# Patient Record
Sex: Male | Born: 1955 | Race: Black or African American | Hispanic: No | Marital: Single | Smoking: Current every day smoker
Health system: Southern US, Community
[De-identification: ages and names within clinical notes are randomized; demographics above are authoritative.]

## PROBLEM LIST (undated history)

## (undated) DIAGNOSIS — N186 End stage renal disease: Secondary | ICD-10-CM

## (undated) DIAGNOSIS — F17200 Nicotine dependence, unspecified, uncomplicated: Secondary | ICD-10-CM

## (undated) DIAGNOSIS — I2510 Atherosclerotic heart disease of native coronary artery without angina pectoris: Secondary | ICD-10-CM

## (undated) DIAGNOSIS — I5032 Chronic diastolic (congestive) heart failure: Secondary | ICD-10-CM

## (undated) DIAGNOSIS — M549 Dorsalgia, unspecified: Secondary | ICD-10-CM

## (undated) DIAGNOSIS — R002 Palpitations: Secondary | ICD-10-CM

## (undated) DIAGNOSIS — G629 Polyneuropathy, unspecified: Secondary | ICD-10-CM

## (undated) DIAGNOSIS — R51 Headache: Secondary | ICD-10-CM

## (undated) DIAGNOSIS — F419 Anxiety disorder, unspecified: Secondary | ICD-10-CM

## (undated) DIAGNOSIS — Z992 Dependence on renal dialysis: Secondary | ICD-10-CM

## (undated) DIAGNOSIS — J341 Cyst and mucocele of nose and nasal sinus: Secondary | ICD-10-CM

## (undated) DIAGNOSIS — G5600 Carpal tunnel syndrome, unspecified upper limb: Secondary | ICD-10-CM

## (undated) DIAGNOSIS — Z955 Presence of coronary angioplasty implant and graft: Secondary | ICD-10-CM

## (undated) DIAGNOSIS — M1990 Unspecified osteoarthritis, unspecified site: Secondary | ICD-10-CM

## (undated) DIAGNOSIS — G510 Bell's palsy: Secondary | ICD-10-CM

## (undated) DIAGNOSIS — K219 Gastro-esophageal reflux disease without esophagitis: Secondary | ICD-10-CM

## (undated) DIAGNOSIS — K759 Inflammatory liver disease, unspecified: Secondary | ICD-10-CM

## (undated) DIAGNOSIS — Z9889 Other specified postprocedural states: Secondary | ICD-10-CM

## (undated) DIAGNOSIS — I10 Essential (primary) hypertension: Secondary | ICD-10-CM

## (undated) DIAGNOSIS — R112 Nausea with vomiting, unspecified: Secondary | ICD-10-CM

## (undated) HISTORY — PX: TOTAL HIP ARTHROPLASTY: SHX124

## (undated) HISTORY — PX: DG AV DIALYSIS GRAFT DECLOT OR: HXRAD813

## (undated) HISTORY — PX: THYROIDECTOMY: SHX17

## (undated) HISTORY — PX: MANDIBLE FRACTURE SURGERY: SHX706

## (undated) HISTORY — PX: CORONARY ANGIOPLASTY WITH STENT PLACEMENT: SHX49

## (undated) HISTORY — PX: TOOTH EXTRACTION: SUR596

## (undated) HISTORY — DX: Presence of coronary angioplasty implant and graft: Z95.5

## (undated) HISTORY — PX: JOINT REPLACEMENT: SHX530

---

## 1997-07-28 ENCOUNTER — Other Ambulatory Visit: Admission: RE | Admit: 1997-07-28 | Discharge: 1997-07-28 | Payer: Self-pay | Admitting: Nephrology

## 1997-09-04 ENCOUNTER — Other Ambulatory Visit: Admission: RE | Admit: 1997-09-04 | Discharge: 1997-09-04 | Payer: Self-pay | Admitting: Nephrology

## 1997-09-06 ENCOUNTER — Other Ambulatory Visit: Admission: RE | Admit: 1997-09-06 | Discharge: 1997-09-06 | Payer: Self-pay | Admitting: Nephrology

## 1997-09-22 ENCOUNTER — Inpatient Hospital Stay (HOSPITAL_COMMUNITY): Admission: EM | Admit: 1997-09-22 | Discharge: 1997-09-24 | Payer: Self-pay | Admitting: Emergency Medicine

## 1997-10-24 ENCOUNTER — Other Ambulatory Visit: Admission: RE | Admit: 1997-10-24 | Discharge: 1997-10-24 | Payer: Self-pay | Admitting: *Deleted

## 1997-10-24 ENCOUNTER — Ambulatory Visit (HOSPITAL_COMMUNITY): Admission: RE | Admit: 1997-10-24 | Discharge: 1997-10-24 | Payer: Self-pay | Admitting: *Deleted

## 1997-10-30 ENCOUNTER — Other Ambulatory Visit: Admission: RE | Admit: 1997-10-30 | Discharge: 1997-10-30 | Payer: Self-pay | Admitting: Nephrology

## 1997-11-20 ENCOUNTER — Ambulatory Visit (HOSPITAL_COMMUNITY): Admission: RE | Admit: 1997-11-20 | Discharge: 1997-11-20 | Payer: Self-pay | Admitting: Vascular Surgery

## 1997-12-29 ENCOUNTER — Ambulatory Visit (HOSPITAL_COMMUNITY): Admission: RE | Admit: 1997-12-29 | Discharge: 1997-12-29 | Payer: Self-pay | Admitting: Vascular Surgery

## 1998-01-07 ENCOUNTER — Ambulatory Visit (HOSPITAL_COMMUNITY): Admission: RE | Admit: 1998-01-07 | Discharge: 1998-01-07 | Payer: Self-pay | Admitting: Vascular Surgery

## 1998-01-11 ENCOUNTER — Ambulatory Visit (HOSPITAL_COMMUNITY): Admission: RE | Admit: 1998-01-11 | Discharge: 1998-01-11 | Payer: Self-pay | Admitting: Nephrology

## 1998-04-10 ENCOUNTER — Ambulatory Visit (HOSPITAL_COMMUNITY): Admission: RE | Admit: 1998-04-10 | Discharge: 1998-04-10 | Payer: Self-pay | Admitting: Nephrology

## 1998-04-10 ENCOUNTER — Encounter: Payer: Self-pay | Admitting: Nephrology

## 1999-04-04 ENCOUNTER — Inpatient Hospital Stay (HOSPITAL_COMMUNITY): Admission: RE | Admit: 1999-04-04 | Discharge: 1999-04-13 | Payer: Self-pay

## 1999-04-18 ENCOUNTER — Encounter: Payer: Self-pay | Admitting: Nephrology

## 1999-04-18 ENCOUNTER — Encounter: Admission: RE | Admit: 1999-04-18 | Discharge: 1999-04-18 | Payer: Self-pay | Admitting: Nephrology

## 1999-04-23 ENCOUNTER — Encounter: Payer: Self-pay | Admitting: Nephrology

## 1999-04-23 ENCOUNTER — Ambulatory Visit (HOSPITAL_COMMUNITY): Admission: RE | Admit: 1999-04-23 | Discharge: 1999-04-23 | Payer: Self-pay | Admitting: Nephrology

## 1999-05-28 ENCOUNTER — Encounter: Admission: RE | Admit: 1999-05-28 | Discharge: 1999-05-28 | Payer: Self-pay | Admitting: Sports Medicine

## 1999-05-28 ENCOUNTER — Encounter: Payer: Self-pay | Admitting: Sports Medicine

## 1999-09-05 ENCOUNTER — Ambulatory Visit: Admission: RE | Admit: 1999-09-05 | Discharge: 1999-09-05 | Payer: Self-pay | Admitting: *Deleted

## 1999-10-11 ENCOUNTER — Encounter: Payer: Self-pay | Admitting: Vascular Surgery

## 1999-10-11 ENCOUNTER — Ambulatory Visit (HOSPITAL_COMMUNITY): Admission: RE | Admit: 1999-10-11 | Discharge: 1999-10-11 | Payer: Self-pay | Admitting: Vascular Surgery

## 1999-10-30 ENCOUNTER — Encounter: Admission: RE | Admit: 1999-10-30 | Discharge: 1999-10-30 | Payer: Self-pay | Admitting: *Deleted

## 1999-11-19 ENCOUNTER — Ambulatory Visit (HOSPITAL_COMMUNITY): Admission: RE | Admit: 1999-11-19 | Discharge: 1999-11-19 | Payer: Self-pay | Admitting: *Deleted

## 1999-12-23 ENCOUNTER — Ambulatory Visit (HOSPITAL_COMMUNITY): Admission: RE | Admit: 1999-12-23 | Discharge: 1999-12-23 | Payer: Self-pay | Admitting: Nephrology

## 2000-01-02 ENCOUNTER — Ambulatory Visit: Admission: RE | Admit: 2000-01-02 | Discharge: 2000-01-02 | Payer: Self-pay | Admitting: Vascular Surgery

## 2000-01-20 ENCOUNTER — Encounter: Payer: Self-pay | Admitting: Nephrology

## 2000-01-20 ENCOUNTER — Encounter (HOSPITAL_COMMUNITY): Admission: RE | Admit: 2000-01-20 | Discharge: 2000-04-19 | Payer: Self-pay | Admitting: Nephrology

## 2000-05-11 ENCOUNTER — Ambulatory Visit (HOSPITAL_COMMUNITY): Admission: RE | Admit: 2000-05-11 | Discharge: 2000-05-11 | Payer: Self-pay | Admitting: *Deleted

## 2000-08-02 ENCOUNTER — Ambulatory Visit (HOSPITAL_COMMUNITY): Admission: RE | Admit: 2000-08-02 | Discharge: 2000-08-02 | Payer: Self-pay | Admitting: *Deleted

## 2000-08-10 ENCOUNTER — Encounter: Admission: RE | Admit: 2000-08-10 | Discharge: 2000-08-10 | Payer: Self-pay | Admitting: *Deleted

## 2000-09-09 ENCOUNTER — Ambulatory Visit (HOSPITAL_COMMUNITY): Admission: RE | Admit: 2000-09-09 | Discharge: 2000-09-09 | Payer: Self-pay | Admitting: Vascular Surgery

## 2000-09-09 ENCOUNTER — Encounter: Payer: Self-pay | Admitting: Vascular Surgery

## 2000-11-09 ENCOUNTER — Ambulatory Visit (HOSPITAL_COMMUNITY): Admission: RE | Admit: 2000-11-09 | Discharge: 2000-11-09 | Payer: Self-pay | Admitting: Nephrology

## 2000-11-09 ENCOUNTER — Encounter: Payer: Self-pay | Admitting: Nephrology

## 2001-02-01 ENCOUNTER — Encounter: Payer: Self-pay | Admitting: Nephrology

## 2001-02-01 ENCOUNTER — Encounter: Admission: RE | Admit: 2001-02-01 | Discharge: 2001-02-01 | Payer: Self-pay | Admitting: Nephrology

## 2001-11-24 ENCOUNTER — Ambulatory Visit (HOSPITAL_COMMUNITY): Admission: RE | Admit: 2001-11-24 | Discharge: 2001-11-24 | Payer: Self-pay | Admitting: Nephrology

## 2001-11-24 ENCOUNTER — Encounter: Payer: Self-pay | Admitting: Nephrology

## 2001-12-01 ENCOUNTER — Encounter: Payer: Self-pay | Admitting: Nephrology

## 2001-12-01 ENCOUNTER — Encounter: Admission: RE | Admit: 2001-12-01 | Discharge: 2001-12-01 | Payer: Self-pay | Admitting: Nephrology

## 2002-01-03 ENCOUNTER — Ambulatory Visit (HOSPITAL_COMMUNITY): Admission: RE | Admit: 2002-01-03 | Discharge: 2002-01-03 | Payer: Self-pay | Admitting: Vascular Surgery

## 2002-04-11 ENCOUNTER — Encounter: Payer: Self-pay | Admitting: Nephrology

## 2002-04-11 ENCOUNTER — Ambulatory Visit (HOSPITAL_COMMUNITY): Admission: RE | Admit: 2002-04-11 | Discharge: 2002-04-11 | Payer: Self-pay | Admitting: Nephrology

## 2002-05-30 ENCOUNTER — Ambulatory Visit (HOSPITAL_COMMUNITY): Admission: RE | Admit: 2002-05-30 | Discharge: 2002-05-30 | Payer: Self-pay | Admitting: *Deleted

## 2002-07-13 ENCOUNTER — Ambulatory Visit (HOSPITAL_COMMUNITY): Admission: RE | Admit: 2002-07-13 | Discharge: 2002-07-13 | Payer: Self-pay | Admitting: Vascular Surgery

## 2002-08-30 ENCOUNTER — Ambulatory Visit (HOSPITAL_COMMUNITY): Admission: RE | Admit: 2002-08-30 | Discharge: 2002-08-30 | Payer: Self-pay | Admitting: *Deleted

## 2002-09-25 ENCOUNTER — Encounter: Payer: Self-pay | Admitting: *Deleted

## 2002-09-25 ENCOUNTER — Ambulatory Visit (HOSPITAL_COMMUNITY): Admission: RE | Admit: 2002-09-25 | Discharge: 2002-09-25 | Payer: Self-pay | Admitting: Vascular Surgery

## 2002-10-26 ENCOUNTER — Ambulatory Visit (HOSPITAL_COMMUNITY): Admission: RE | Admit: 2002-10-26 | Discharge: 2002-10-26 | Payer: Self-pay | Admitting: Vascular Surgery

## 2002-10-26 ENCOUNTER — Encounter: Payer: Self-pay | Admitting: Vascular Surgery

## 2002-11-21 ENCOUNTER — Inpatient Hospital Stay (HOSPITAL_COMMUNITY): Admission: EM | Admit: 2002-11-21 | Discharge: 2002-11-28 | Payer: Self-pay | Admitting: Emergency Medicine

## 2002-11-22 ENCOUNTER — Encounter: Payer: Self-pay | Admitting: Nephrology

## 2002-11-28 ENCOUNTER — Encounter: Payer: Self-pay | Admitting: Nephrology

## 2002-12-14 ENCOUNTER — Encounter: Payer: Self-pay | Admitting: Nephrology

## 2002-12-14 ENCOUNTER — Ambulatory Visit (HOSPITAL_COMMUNITY): Admission: RE | Admit: 2002-12-14 | Discharge: 2002-12-14 | Payer: Self-pay | Admitting: Nephrology

## 2003-02-06 ENCOUNTER — Encounter: Payer: Self-pay | Admitting: *Deleted

## 2003-02-06 ENCOUNTER — Ambulatory Visit (HOSPITAL_COMMUNITY): Admission: RE | Admit: 2003-02-06 | Discharge: 2003-02-06 | Payer: Self-pay | Admitting: *Deleted

## 2003-03-26 ENCOUNTER — Ambulatory Visit (HOSPITAL_COMMUNITY): Admission: RE | Admit: 2003-03-26 | Discharge: 2003-03-26 | Payer: Self-pay | Admitting: Vascular Surgery

## 2003-03-29 ENCOUNTER — Ambulatory Visit (HOSPITAL_COMMUNITY): Admission: RE | Admit: 2003-03-29 | Discharge: 2003-03-29 | Payer: Self-pay | Admitting: Vascular Surgery

## 2003-05-24 ENCOUNTER — Ambulatory Visit (HOSPITAL_COMMUNITY): Admission: RE | Admit: 2003-05-24 | Discharge: 2003-05-24 | Payer: Self-pay | Admitting: Vascular Surgery

## 2003-06-05 ENCOUNTER — Ambulatory Visit (HOSPITAL_COMMUNITY): Admission: RE | Admit: 2003-06-05 | Discharge: 2003-06-05 | Payer: Self-pay | Admitting: Nephrology

## 2003-09-10 ENCOUNTER — Ambulatory Visit (HOSPITAL_COMMUNITY): Admission: RE | Admit: 2003-09-10 | Discharge: 2003-09-10 | Payer: Self-pay | Admitting: Vascular Surgery

## 2003-09-11 ENCOUNTER — Ambulatory Visit (HOSPITAL_COMMUNITY): Admission: RE | Admit: 2003-09-11 | Discharge: 2003-09-11 | Payer: Self-pay | Admitting: *Deleted

## 2003-09-18 ENCOUNTER — Ambulatory Visit (HOSPITAL_COMMUNITY): Admission: RE | Admit: 2003-09-18 | Discharge: 2003-09-18 | Payer: Self-pay | Admitting: Nephrology

## 2003-10-02 ENCOUNTER — Ambulatory Visit (HOSPITAL_COMMUNITY): Admission: RE | Admit: 2003-10-02 | Discharge: 2003-10-02 | Payer: Self-pay | Admitting: Nephrology

## 2003-10-11 ENCOUNTER — Encounter (HOSPITAL_COMMUNITY): Admission: RE | Admit: 2003-10-11 | Discharge: 2003-10-22 | Payer: Self-pay | Admitting: Nephrology

## 2003-11-13 ENCOUNTER — Ambulatory Visit (HOSPITAL_COMMUNITY): Admission: RE | Admit: 2003-11-13 | Discharge: 2003-11-13 | Payer: Self-pay | Admitting: Nephrology

## 2003-11-27 ENCOUNTER — Ambulatory Visit (HOSPITAL_COMMUNITY): Admission: RE | Admit: 2003-11-27 | Discharge: 2003-11-27 | Payer: Self-pay | Admitting: Vascular Surgery

## 2003-12-04 ENCOUNTER — Encounter (HOSPITAL_COMMUNITY): Admission: RE | Admit: 2003-12-04 | Discharge: 2003-12-10 | Payer: Self-pay | Admitting: Nephrology

## 2004-01-24 ENCOUNTER — Ambulatory Visit (HOSPITAL_COMMUNITY): Admission: RE | Admit: 2004-01-24 | Discharge: 2004-01-24 | Payer: Self-pay | Admitting: Vascular Surgery

## 2004-01-31 ENCOUNTER — Inpatient Hospital Stay (HOSPITAL_COMMUNITY): Admission: AD | Admit: 2004-01-31 | Discharge: 2004-02-01 | Payer: Self-pay | Admitting: *Deleted

## 2004-06-10 ENCOUNTER — Ambulatory Visit (HOSPITAL_COMMUNITY): Admission: RE | Admit: 2004-06-10 | Discharge: 2004-06-11 | Payer: Self-pay | Admitting: Vascular Surgery

## 2004-06-11 ENCOUNTER — Ambulatory Visit (HOSPITAL_COMMUNITY): Admission: RE | Admit: 2004-06-11 | Discharge: 2004-06-11 | Payer: Self-pay | Admitting: Vascular Surgery

## 2004-08-22 ENCOUNTER — Inpatient Hospital Stay (HOSPITAL_COMMUNITY): Admission: EM | Admit: 2004-08-22 | Discharge: 2004-08-30 | Payer: Self-pay | Admitting: Emergency Medicine

## 2004-08-31 ENCOUNTER — Ambulatory Visit (HOSPITAL_COMMUNITY): Admission: RE | Admit: 2004-08-31 | Discharge: 2004-08-31 | Payer: Self-pay | Admitting: Nephrology

## 2004-09-04 ENCOUNTER — Ambulatory Visit (HOSPITAL_COMMUNITY): Admission: RE | Admit: 2004-09-04 | Discharge: 2004-09-04 | Payer: Self-pay | Admitting: Nephrology

## 2004-10-31 ENCOUNTER — Emergency Department (HOSPITAL_COMMUNITY): Admission: EM | Admit: 2004-10-31 | Discharge: 2004-10-31 | Payer: Self-pay | Admitting: Emergency Medicine

## 2004-11-14 ENCOUNTER — Ambulatory Visit (HOSPITAL_COMMUNITY): Admission: RE | Admit: 2004-11-14 | Discharge: 2004-11-14 | Payer: Self-pay | Admitting: Vascular Surgery

## 2004-11-17 ENCOUNTER — Ambulatory Visit (HOSPITAL_COMMUNITY): Admission: RE | Admit: 2004-11-17 | Discharge: 2004-11-17 | Payer: Self-pay | Admitting: Vascular Surgery

## 2004-12-16 ENCOUNTER — Ambulatory Visit (HOSPITAL_COMMUNITY): Admission: RE | Admit: 2004-12-16 | Discharge: 2004-12-16 | Payer: Self-pay | Admitting: Nephrology

## 2005-02-02 ENCOUNTER — Emergency Department (HOSPITAL_COMMUNITY): Admission: EM | Admit: 2005-02-02 | Discharge: 2005-02-03 | Payer: Self-pay | Admitting: Emergency Medicine

## 2005-02-15 ENCOUNTER — Encounter: Admission: RE | Admit: 2005-02-15 | Discharge: 2005-02-15 | Payer: Self-pay | Admitting: Nephrology

## 2005-02-18 ENCOUNTER — Encounter: Admission: RE | Admit: 2005-02-18 | Discharge: 2005-02-18 | Payer: Self-pay | Admitting: Nephrology

## 2005-03-03 ENCOUNTER — Ambulatory Visit (HOSPITAL_COMMUNITY): Admission: RE | Admit: 2005-03-03 | Discharge: 2005-03-03 | Payer: Self-pay | Admitting: Vascular Surgery

## 2005-04-07 ENCOUNTER — Inpatient Hospital Stay (HOSPITAL_COMMUNITY): Admission: RE | Admit: 2005-04-07 | Discharge: 2005-04-08 | Payer: Self-pay | Admitting: Vascular Surgery

## 2005-06-03 ENCOUNTER — Ambulatory Visit (HOSPITAL_COMMUNITY): Admission: RE | Admit: 2005-06-03 | Discharge: 2005-06-03 | Payer: Self-pay | Admitting: Nephrology

## 2005-06-08 ENCOUNTER — Inpatient Hospital Stay (HOSPITAL_COMMUNITY): Admission: AD | Admit: 2005-06-08 | Discharge: 2005-06-17 | Payer: Self-pay | Admitting: Nephrology

## 2005-06-11 ENCOUNTER — Encounter: Payer: Self-pay | Admitting: Nephrology

## 2005-06-17 ENCOUNTER — Ambulatory Visit: Payer: Self-pay | Admitting: Infectious Diseases

## 2005-07-14 ENCOUNTER — Ambulatory Visit (HOSPITAL_COMMUNITY): Admission: RE | Admit: 2005-07-14 | Discharge: 2005-07-14 | Payer: Self-pay | Admitting: Vascular Surgery

## 2005-10-30 ENCOUNTER — Ambulatory Visit (HOSPITAL_COMMUNITY): Admission: RE | Admit: 2005-10-30 | Discharge: 2005-10-30 | Payer: Self-pay | Admitting: Nephrology

## 2006-01-21 ENCOUNTER — Ambulatory Visit (HOSPITAL_COMMUNITY): Admission: RE | Admit: 2006-01-21 | Discharge: 2006-01-21 | Payer: Self-pay | Admitting: Neurosurgery

## 2006-01-29 ENCOUNTER — Ambulatory Visit (HOSPITAL_COMMUNITY): Admission: RE | Admit: 2006-01-29 | Discharge: 2006-01-29 | Payer: Self-pay | Admitting: Nephrology

## 2006-04-02 ENCOUNTER — Emergency Department (HOSPITAL_COMMUNITY): Admission: EM | Admit: 2006-04-02 | Discharge: 2006-04-02 | Payer: Self-pay | Admitting: Emergency Medicine

## 2008-03-15 ENCOUNTER — Ambulatory Visit (HOSPITAL_COMMUNITY): Admission: RE | Admit: 2008-03-15 | Discharge: 2008-03-15 | Payer: Self-pay | Admitting: Nephrology

## 2008-04-27 DIAGNOSIS — K759 Inflammatory liver disease, unspecified: Secondary | ICD-10-CM

## 2008-04-27 HISTORY — DX: Inflammatory liver disease, unspecified: K75.9

## 2009-07-23 ENCOUNTER — Encounter: Admission: RE | Admit: 2009-07-23 | Discharge: 2009-07-23 | Payer: Self-pay | Admitting: Nephrology

## 2010-05-17 ENCOUNTER — Encounter: Payer: Self-pay | Admitting: Nephrology

## 2010-05-18 ENCOUNTER — Encounter: Payer: Self-pay | Admitting: Nephrology

## 2010-06-03 ENCOUNTER — Encounter (HOSPITAL_COMMUNITY): Payer: Self-pay | Admitting: Radiology

## 2010-06-03 ENCOUNTER — Emergency Department (HOSPITAL_COMMUNITY): Payer: Medicare Other

## 2010-06-03 ENCOUNTER — Inpatient Hospital Stay (HOSPITAL_COMMUNITY)
Admission: EM | Admit: 2010-06-03 | Discharge: 2010-06-11 | DRG: 423 | Disposition: A | Discharge status: Home / Self Care | Payer: Medicare Other | Source: Ambulatory Visit | Attending: Family Medicine | Admitting: Family Medicine

## 2010-06-03 DIAGNOSIS — Z992 Dependence on renal dialysis: Secondary | ICD-10-CM

## 2010-06-03 DIAGNOSIS — A498 Other bacterial infections of unspecified site: Secondary | ICD-10-CM | POA: Diagnosis present

## 2010-06-03 DIAGNOSIS — N186 End stage renal disease: Secondary | ICD-10-CM | POA: Diagnosis present

## 2010-06-03 DIAGNOSIS — K859 Acute pancreatitis without necrosis or infection, unspecified: Principal | ICD-10-CM | POA: Diagnosis present

## 2010-06-03 DIAGNOSIS — J449 Chronic obstructive pulmonary disease, unspecified: Secondary | ICD-10-CM | POA: Diagnosis present

## 2010-06-03 DIAGNOSIS — R7881 Bacteremia: Secondary | ICD-10-CM | POA: Diagnosis present

## 2010-06-03 DIAGNOSIS — I9589 Other hypotension: Secondary | ICD-10-CM | POA: Diagnosis present

## 2010-06-03 DIAGNOSIS — G8929 Other chronic pain: Secondary | ICD-10-CM | POA: Diagnosis present

## 2010-06-03 DIAGNOSIS — M545 Low back pain, unspecified: Secondary | ICD-10-CM | POA: Diagnosis present

## 2010-06-03 DIAGNOSIS — E875 Hyperkalemia: Secondary | ICD-10-CM | POA: Diagnosis present

## 2010-06-03 DIAGNOSIS — M8668 Other chronic osteomyelitis, other site: Secondary | ICD-10-CM | POA: Diagnosis present

## 2010-06-03 DIAGNOSIS — F17200 Nicotine dependence, unspecified, uncomplicated: Secondary | ICD-10-CM | POA: Diagnosis present

## 2010-06-03 DIAGNOSIS — T82898A Other specified complication of vascular prosthetic devices, implants and grafts, initial encounter: Secondary | ICD-10-CM | POA: Diagnosis present

## 2010-06-03 DIAGNOSIS — J4489 Other specified chronic obstructive pulmonary disease: Secondary | ICD-10-CM | POA: Diagnosis present

## 2010-06-03 DIAGNOSIS — N2581 Secondary hyperparathyroidism of renal origin: Secondary | ICD-10-CM | POA: Diagnosis present

## 2010-06-03 DIAGNOSIS — E119 Type 2 diabetes mellitus without complications: Secondary | ICD-10-CM | POA: Diagnosis present

## 2010-06-03 DIAGNOSIS — Y849 Medical procedure, unspecified as the cause of abnormal reaction of the patient, or of later complication, without mention of misadventure at the time of the procedure: Secondary | ICD-10-CM | POA: Diagnosis present

## 2010-06-03 LAB — DIFFERENTIAL
Basophils Relative: 0 % (ref 0–1)
Eosinophils Absolute: 0 10*3/uL (ref 0.0–0.7)
Lymphocytes Relative: 2 % — ABNORMAL LOW (ref 12–46)
Monocytes Relative: 4 % (ref 3–12)
Neutro Abs: 29.4 10*3/uL — ABNORMAL HIGH (ref 1.7–7.7)
Neutrophils Relative %: 94 % — ABNORMAL HIGH (ref 43–77)

## 2010-06-03 LAB — BASIC METABOLIC PANEL
CO2: 22 mEq/L (ref 19–32)
Creatinine, Ser: 12.6 mg/dL — ABNORMAL HIGH (ref 0.4–1.5)
Glucose, Bld: 128 mg/dL — ABNORMAL HIGH (ref 70–99)
Potassium: 5.5 mEq/L — ABNORMAL HIGH (ref 3.5–5.1)
Sodium: 132 mEq/L — ABNORMAL LOW (ref 135–145)

## 2010-06-03 LAB — CBC
HCT: 47.4 % (ref 39.0–52.0)
MCH: 30.8 pg (ref 26.0–34.0)
MCV: 93.1 fL (ref 78.0–100.0)
RBC: 5.09 MIL/uL (ref 4.22–5.81)
RDW: 15.4 % (ref 11.5–15.5)
WBC: 31.4 10*3/uL — ABNORMAL HIGH (ref 4.0–10.5)

## 2010-06-03 LAB — HEPATIC FUNCTION PANEL
Bilirubin, Direct: 1.7 mg/dL — ABNORMAL HIGH (ref 0.0–0.3)
Total Bilirubin: 2.9 mg/dL — ABNORMAL HIGH (ref 0.3–1.2)

## 2010-06-03 LAB — GLUCOSE, CAPILLARY
Glucose-Capillary: 101 mg/dL — ABNORMAL HIGH (ref 70–99)
Glucose-Capillary: 92 mg/dL (ref 70–99)

## 2010-06-03 LAB — APTT: aPTT: 33 seconds (ref 24–37)

## 2010-06-03 LAB — LIPASE, BLOOD: Lipase: 118 U/L — ABNORMAL HIGH (ref 11–59)

## 2010-06-03 LAB — MRSA PCR SCREENING: MRSA by PCR: NEGATIVE

## 2010-06-03 LAB — PROTIME-INR: INR: 1.06 (ref 0.00–1.49)

## 2010-06-03 LAB — LACTATE DEHYDROGENASE: LDH: 208 U/L (ref 94–250)

## 2010-06-03 MED ORDER — IOHEXOL 300 MG/ML  SOLN: 100.0000 mL | Freq: Once | INTRAMUSCULAR | Status: AC | PRN

## 2010-06-04 ENCOUNTER — Inpatient Hospital Stay (HOSPITAL_COMMUNITY): Payer: Medicare Other

## 2010-06-04 LAB — CBC
Hemoglobin: 14.2 g/dL (ref 13.0–17.0)
MCV: 93.1 fL (ref 78.0–100.0)
Platelets: 160 10*3/uL (ref 150–400)
RDW: 15.9 % — ABNORMAL HIGH (ref 11.5–15.5)
WBC: 17.9 10*3/uL — ABNORMAL HIGH (ref 4.0–10.5)

## 2010-06-04 LAB — COMPREHENSIVE METABOLIC PANEL
ALT: 46 U/L (ref 0–53)
CO2: 23 mEq/L (ref 19–32)
Calcium: 8.4 mg/dL (ref 8.4–10.5)
GFR calc non Af Amer: 4 mL/min — ABNORMAL LOW (ref >60)
Glucose, Bld: 96 mg/dL (ref 70–99)
Sodium: 131 mEq/L — ABNORMAL LOW (ref 135–145)

## 2010-06-04 LAB — GLUCOSE, CAPILLARY: Glucose-Capillary: 87 mg/dL (ref 70–99)

## 2010-06-04 LAB — LIPASE, BLOOD: Lipase: 33 U/L (ref 11–59)

## 2010-06-04 LAB — MAGNESIUM: Magnesium: 1.9 mg/dL (ref 1.5–2.5)

## 2010-06-05 ENCOUNTER — Inpatient Hospital Stay (HOSPITAL_COMMUNITY): Payer: Medicare Other

## 2010-06-05 DIAGNOSIS — B9689 Other specified bacterial agents as the cause of diseases classified elsewhere: Secondary | ICD-10-CM

## 2010-06-05 DIAGNOSIS — I10 Essential (primary) hypertension: Secondary | ICD-10-CM

## 2010-06-05 DIAGNOSIS — M8660 Other chronic osteomyelitis, unspecified site: Secondary | ICD-10-CM

## 2010-06-05 DIAGNOSIS — J449 Chronic obstructive pulmonary disease, unspecified: Secondary | ICD-10-CM

## 2010-06-05 DIAGNOSIS — N186 End stage renal disease: Secondary | ICD-10-CM

## 2010-06-05 DIAGNOSIS — R7881 Bacteremia: Secondary | ICD-10-CM

## 2010-06-05 DIAGNOSIS — M109 Gout, unspecified: Secondary | ICD-10-CM

## 2010-06-05 LAB — BASIC METABOLIC PANEL
BUN: 48 mg/dL — ABNORMAL HIGH (ref 6–23)
CO2: 24 mEq/L (ref 19–32)
Calcium: 9 mg/dL (ref 8.4–10.5)
GFR calc Af Amer: 6 mL/min — ABNORMAL LOW (ref >60)
GFR calc non Af Amer: 5 mL/min — ABNORMAL LOW (ref >60)
GFR calc non Af Amer: 5 mL/min — ABNORMAL LOW (ref >60)
Glucose, Bld: 115 mg/dL — ABNORMAL HIGH (ref 70–99)
Potassium: 4.3 mEq/L (ref 3.5–5.1)
Sodium: 133 mEq/L — ABNORMAL LOW (ref 135–145)

## 2010-06-05 LAB — CBC
HCT: 40.1 % (ref 39.0–52.0)
MCH: 30.8 pg (ref 26.0–34.0)
MCHC: 32.8 g/dL (ref 30.0–36.0)
MCV: 93.5 fL (ref 78.0–100.0)
Platelets: 125 10*3/uL — ABNORMAL LOW (ref 150–400)
RBC: 4.29 MIL/uL (ref 4.22–5.81)
RDW: 15.8 % — ABNORMAL HIGH (ref 11.5–15.5)

## 2010-06-05 LAB — DIFFERENTIAL
Eosinophils Absolute: 0.3 10*3/uL (ref 0.0–0.7)
Eosinophils Relative: 4 % (ref 0–5)
Lymphocytes Relative: 7 % — ABNORMAL LOW (ref 12–46)
Lymphs Abs: 0.6 10*3/uL — ABNORMAL LOW (ref 0.7–4.0)
Monocytes Relative: 17 % — ABNORMAL HIGH (ref 3–12)
Neutrophils Relative %: 72 % (ref 43–77)

## 2010-06-05 LAB — GLUCOSE, CAPILLARY: Glucose-Capillary: 106 mg/dL — ABNORMAL HIGH (ref 70–99)

## 2010-06-06 ENCOUNTER — Inpatient Hospital Stay (HOSPITAL_COMMUNITY): Payer: Medicare Other

## 2010-06-06 DIAGNOSIS — T82898A Other specified complication of vascular prosthetic devices, implants and grafts, initial encounter: Secondary | ICD-10-CM

## 2010-06-06 LAB — GLUCOSE, CAPILLARY
Glucose-Capillary: 101 mg/dL — ABNORMAL HIGH (ref 70–99)
Glucose-Capillary: 119 mg/dL — ABNORMAL HIGH (ref 70–99)

## 2010-06-06 LAB — CBC
MCV: 92.2 fL (ref 78.0–100.0)
Platelets: 148 10*3/uL — ABNORMAL LOW (ref 150–400)
RBC: 4.21 MIL/uL — ABNORMAL LOW (ref 4.22–5.81)
RDW: 15.5 % (ref 11.5–15.5)
WBC: 8.2 10*3/uL (ref 4.0–10.5)

## 2010-06-06 LAB — COMPREHENSIVE METABOLIC PANEL
ALT: 26 U/L (ref 0–53)
AST: 52 U/L — ABNORMAL HIGH (ref 0–37)
Albumin: 3 g/dL — ABNORMAL LOW (ref 3.5–5.2)
Alkaline Phosphatase: 99 U/L (ref 39–117)
BUN: 60 mg/dL — ABNORMAL HIGH (ref 6–23)
Chloride: 90 mEq/L — ABNORMAL LOW (ref 96–112)
Potassium: 4.4 mEq/L (ref 3.5–5.1)
Sodium: 130 mEq/L — ABNORMAL LOW (ref 135–145)
Total Bilirubin: 0.5 mg/dL (ref 0.3–1.2)
Total Protein: 7.8 g/dL (ref 6.0–8.3)

## 2010-06-07 LAB — GLUCOSE, CAPILLARY
Glucose-Capillary: 133 mg/dL — ABNORMAL HIGH (ref 70–99)
Glucose-Capillary: 106 mg/dL — ABNORMAL HIGH (ref 70–99)
Glucose-Capillary: 111 mg/dL — ABNORMAL HIGH (ref 70–99)
Glucose-Capillary: 134 mg/dL — ABNORMAL HIGH (ref 70–99)

## 2010-06-07 LAB — CULTURE, BLOOD (ROUTINE X 2): Culture  Setup Time: 201202080039

## 2010-06-08 LAB — CBC
Hemoglobin: 13.4 g/dL (ref 13.0–17.0)
MCH: 30.6 pg (ref 26.0–34.0)
Platelets: 137 10*3/uL — ABNORMAL LOW (ref 150–400)
RBC: 4.38 MIL/uL (ref 4.22–5.81)
WBC: 9.8 10*3/uL (ref 4.0–10.5)

## 2010-06-08 LAB — BASIC METABOLIC PANEL
Calcium: 8.3 mg/dL — ABNORMAL LOW (ref 8.4–10.5)
Chloride: 96 mEq/L (ref 96–112)
Creatinine, Ser: 10.73 mg/dL — ABNORMAL HIGH (ref 0.4–1.5)
GFR calc Af Amer: 6 mL/min — ABNORMAL LOW (ref >60)
Sodium: 133 mEq/L — ABNORMAL LOW (ref 135–145)

## 2010-06-08 LAB — GLUCOSE, CAPILLARY
Glucose-Capillary: 108 mg/dL — ABNORMAL HIGH (ref 70–99)
Glucose-Capillary: 123 mg/dL — ABNORMAL HIGH (ref 70–99)
Glucose-Capillary: 93 mg/dL (ref 70–99)

## 2010-06-09 ENCOUNTER — Inpatient Hospital Stay (HOSPITAL_COMMUNITY): Payer: Medicare Other

## 2010-06-09 LAB — CBC
MCH: 30.6 pg (ref 26.0–34.0)
MCV: 90.3 fL (ref 78.0–100.0)
Platelets: 182 10*3/uL (ref 150–400)
RDW: 15.1 % (ref 11.5–15.5)
WBC: 11.1 10*3/uL — ABNORMAL HIGH (ref 4.0–10.5)

## 2010-06-09 LAB — BASIC METABOLIC PANEL
CO2: 21 mEq/L (ref 19–32)
Calcium: 8.2 mg/dL — ABNORMAL LOW (ref 8.4–10.5)
Creatinine, Ser: 13.2 mg/dL — ABNORMAL HIGH (ref 0.4–1.5)
Glucose, Bld: 110 mg/dL — ABNORMAL HIGH (ref 70–99)

## 2010-06-09 LAB — PROTIME-INR: Prothrombin Time: 12.5 seconds (ref 11.6–15.2)

## 2010-06-09 LAB — GLUCOSE, CAPILLARY

## 2010-06-09 MED ORDER — IOHEXOL 300 MG/ML  SOLN: 100.0000 mL | Freq: Once | INTRAMUSCULAR | Status: AC | PRN

## 2010-06-10 DIAGNOSIS — B9689 Other specified bacterial agents as the cause of diseases classified elsewhere: Secondary | ICD-10-CM

## 2010-06-10 DIAGNOSIS — R7881 Bacteremia: Secondary | ICD-10-CM

## 2010-06-10 DIAGNOSIS — M8660 Other chronic osteomyelitis, unspecified site: Secondary | ICD-10-CM

## 2010-06-11 ENCOUNTER — Inpatient Hospital Stay (HOSPITAL_COMMUNITY): Payer: Medicare Other

## 2010-06-11 LAB — RENAL FUNCTION PANEL
CO2: 21 mEq/L (ref 19–32)
Calcium: 7.5 mg/dL — ABNORMAL LOW (ref 8.4–10.5)
Creatinine, Ser: 12 mg/dL — ABNORMAL HIGH (ref 0.4–1.5)
GFR calc Af Amer: 5 mL/min — ABNORMAL LOW (ref >60)
GFR calc non Af Amer: 4 mL/min — ABNORMAL LOW (ref >60)
Phosphorus: 5.3 mg/dL — ABNORMAL HIGH (ref 2.3–4.6)
Sodium: 134 mEq/L — ABNORMAL LOW (ref 135–145)

## 2010-06-11 LAB — CBC
MCH: 29.2 pg (ref 26.0–34.0)
MCHC: 32.3 g/dL (ref 30.0–36.0)
Platelets: 181 10*3/uL (ref 150–400)
RDW: 15.5 % (ref 11.5–15.5)

## 2010-06-11 MED ORDER — IOHEXOL 300 MG/ML  SOLN
100.0000 mL | Freq: Once | INTRAMUSCULAR | Status: AC | PRN
  Administered 2010-06-11: 75 mL via INTRAVENOUS

## 2010-06-12 LAB — CULTURE, BLOOD (ROUTINE X 2)
Culture  Setup Time: 201202100042
Culture  Setup Time: 201202100217
Culture: NO GROWTH

## 2010-06-12 LAB — OVA AND PARASITE EXAMINATION

## 2010-06-13 LAB — STRONGYLOIDES ANTIBODY

## 2010-06-17 NOTE — Discharge Summary (Signed)
[name], [name]                 ACCOUNT NO.:  [id number]  MEDICAL RECORD NO.:  [id number]           PATIENT TYPE:  I  LOCATION:  6736                         FACILITY:  [facility]  PHYSICIAN:  Leighton Roach Nicholos Aloisi, M.D.DATE OF BIRTH:  1955-09-01  DATE OF ADMISSION:  06/03/2010 DATE OF DISCHARGE:  06/11/2010                              DISCHARGE SUMMARY   DISCHARGE DIAGNOSES: 1. Klebsiella and Escherichia coli bacteremia, unknown source. 2. Thrombosed right femoral dialysis graft, status post thrombolysis. 3. End-stage renal disease. 4. Chronic osteomyelitis of lumbar spine. 5. Chronic pain.  DISCHARGE MEDICATIONS: 1. Ciprofloxacin 500 mg p.o. nightly take for six more days. 2. Advil 200 mg one-three tablets p.o. q. 8 p.r.n. for pain. 3. Aspirin 325 mg p.o. daily. 4. Atarax 25 mg p.o. t.i.d. p.r.n. for itching. 5. Colchicine 0.6 mg p.o. b.i.d. p.r.n. for gout flare. 6. Nephro-Vite one tablet p.o. daily. 7. Gabapentin 100 mg p.o. nightly. 8. Nexium 40 mg p.o. nightly p.r.n. for acid reflux. 9. Oxycodone 5 mg p.o. q. 6 p.r.n. for pain. 10.PhosLo 667 mg four tablets p.o. t.i.d. before meals. 11.Renvela 2.4 g p.o. t.i.d. before meals. 12.Sodium bicarbonate 650 mg p.o. b.i.d.  CONSULTANTS:  Nephrology, Infectious Disease, Vascular Surgery.  PERTINENT LAB VALUES:  On June 03, 2010, hepatic function panel, total bilirubin 2.9, direct bilirubin 1.7, indirect bilirubin 1.2, alkaline phosphatase 157, AST 159, ALT 64, total protein 9.0, albumin 3.4, lipase 118.  CBC with differential, white blood cell count 31.4, hemoglobin 15.7, hematocrit 47.4, platelets 154,000, neutrophils 94%, lymphocytes 2%, monocytes 4%.  On June 04, 2010, complete metabolic panel, sodium 131, potassium 6.2, chloride 90, CO2 of 23, glucose 96, BUN 57, creatinine 14.07, total bilirubin 2.9, alkaline phosphatase 137, AST 99, ALT 46, total protein 8.4, albumin 3.2, calcium 8.4.  On June 03, 2010, two sets of  blood cultures were positive for Klebsiella pneumoniae and E coli sensitive to ciprofloxacin.  On June 05, 2010, a repeat set of blood cultures were negative.  No growth for 5 days.  RADIOLOGY:  On June 03, 2010, an abdominal ultrasound showed no definite gallstones or biliary ductal dilatation and small atrophic kidneys, numerous small cysts consistent with renal disease of dialysis. Two-view chest x-ray showed low lung volumes with chronic prominence of interstitial lung markings, findings suggest chronic interstitial lung disease.  CT abdomen and pelvis showed stable appearance of porta hepatis and portacaval adenopathy, this is unchanged in 2006 and uncertain significant cystic renal disease of dialysis, atherosclerosis, and coronary artery disease.  Appendix appears normal.  No bowel abnormality identified.  Findings of chronic diskitis, osteomyelitis at L5-S1 level, similar findings noted in 2007.  PROCEDURES:  On June 04, 2010, the patient had placement of temporary left jugular dialysis catheter.  On June 09, 2010, the patient had a failed thrombolysis of a right HD femoral graft.  On June 11, 2010, the patient had a successful thrombolysis of right femoral HD graft.  BRIEF HOSPITAL COURSE:  [name] is a 55 year old man with end-stage renal disease, who presented to the hospital with nausea, vomiting, and abdominal pain, and mildly elevated LFTs and lipase  concerning for pancreatitis, who also had a clotted right femoral hemodialysis graft on admission. 1. Abdominal pain.  The patient was initially admitted for concern for     pancreatitis.  He was made n.p.o. and given IV fluids, as his pain     was controlled on the day after admission, his abdominal pain had     resolved and his LFTs and lipase were trending down.  The patient's     abdominal pain did not return and he remains stable from a GI     standpoint for the rest of hospitalization. 2. Clotted  femoral graft.  The patient had received hemodialysis on     the day before admission without problems.  However, on admission     the femoral graft had no thrill and was clearly clotted, the Renal     Team initially asked IR to thrombolysed the clot the day after     admission, however, due to bacteremia, I order not to do this.     Initially, the patient had a left arm IJ temporary dialysis     catheter placed for hemodialysis and he received dialysis on     Mondays, Wednesdays, Fridays during admission. 3. Bacteremia.  The patient's white count on admission of 31.  His     blood cultures grew out gram-negative rods in less than 24 hours.     He was started on Ceftin and ID was consulted.  ID was concerned     for infection from the right femoral graft, however, Nephrology had     felt that it may be due to the patient's chronic osteomyelitis.     Vascular Surgery was involved for management, who suggested during     an ultrasound of the graft to evaluate for any fluid collections     that ultrasound was negative, so Vascular Surgery felt that     thrombolysis by IR was safe that was done after the patient had     been on antibiotics for 5 days.  The blood cultures eventually grew     out Klebsiella and E coli both of which were sensitive to     ciprofloxacin.  The patient was transitioned to p.o. antibiotics     and discharged with antibiotics to complete a 14-day course for     bacteremia.  FOLLOWUP ISSUES AND RECOMMENDATIONS:  The patient to complete all his antibiotics and continue other home medications.  He is to follow up with Nephrology as needed.  He will resume Monday, Wednesday, Friday dialysis.    ______________________________ Ardyth Gal, MD   ______________________________ Leighton Roach Romani Wilbon, M.D.    CR/MEDQ  D:  06/12/2010  T:  06/13/2010  Job:  [id number]  Electronically Signed by Ardyth Gal MD on 06/16/2010 03:26:10 PM Electronically Signed by  Acquanetta Belling M.D. on 06/17/2010 11:22:57 AM

## 2010-06-23 NOTE — H&P (Signed)
[name], [name] NO.:  [id number]  MEDICAL RECORD NO.:  [id number]           PATIENT TYPE:  I  LOCATION:  6736                         FACILITY:  [facility]  PHYSICIAN:  Paula Compton, MD        DATE OF BIRTH:  1955/10/17  DATE OF ADMISSION:  06/03/2010 DATE OF DISCHARGE:                             HISTORY & PHYSICAL   CHIEF COMPLAINT:  Abdominal pain.  HISTORY OF PRESENT ILLNESS:  The patient states that he started having abdominal pain last night.  He says the pain is severe and points to his epigastric region as the location of the pain.  He says he tried taking his home pain medications but he continued to have pain, and he also had nausea and vomiting.  He vomited several times.  He said that he had tried to drink Kool-Aid and water, but he could not keep anything down, so he came to the emergency room.  ALLERGIES:  No known drug allergies.  MEDICATIONS: 1. Sodium bicarb 650 mg p.o. b.i.d. 2. Oxycodone 5 mg p.o. t.i.d. 3. Renvela 2.4 g p.o. t.i.d. a.c. 4. Neurontin 100 mg p.o. nightly. 5. Colchicine 0.6 mg p.o. b.i.d. 6. Tramadol 50 mg q.8 h. p.o. p.r.n. 7. Nexium 40 mg p.o. nightly. 8. Atarax 25 mg p.o. t.i.d. 9. Advil 200 mg p.o. q.6 h. p.r.n. pain. 10.Aspirin 325 mg p.o. daily. 11.PhosLo 667 mg tabs 4 tablets p.o. t.i.d. a.c. 12.Viagra 100 mg p.o. daily p.r.n. 13.Nephro-Vite one tablet p.o. daily.  PAST MEDICAL HISTORY: 1. End-stage renal disease. 2. Diabetes mellitus, type 2. 3. Hypertension. 4. Cardiomyopathy. 5. Hepatitis C. 6. Gout. 7. Thyroidectomy. 8. COPD. 9. History of substance abuse.  PAST SURGICAL HISTORY:  The patient has had multiple AV graft placed for hemodialysis.  SOCIAL HISTORY:  The patient lives with his girlfriend.  He is unemployed.  The patient smokes about one pack of cigarettes a day.  He denies current alcohol use but had a significant past history.  The patient also denies current drug use but has  significant cocaine use in the past.  REVIEW OF SYSTEMS:  The patient complains of fevers and chills.  Denies headaches.  Denies chest pain.  Denies palpitations.  Denies wheezing. Denies shortness of breath.  Complains of nausea and vomiting.  Denies diarrhea.  Denies dysuria.  Denies back pain.  Denies rash.  Denies dizziness.  PHYSICAL EXAMINATION:  VITAL SIGNS:  Temperature 97.0, pulse 112, respiratory rate 22, blood pressure 142/75, pulse ox 96% on room air. GENERAL:  The patient is mildly uncomfortable. HEAD, EYES, EARS, NOSE, AND THROAT:  Pupils are equal, round, and reactive to light.  Extraocular movements intact.  Funduscopic exam is within normal limits. NECK:  Supple. CARDIOVASCULAR:  Regular rate and rhythm.  No murmurs, rubs, gallops. LUNGS:  The patient has diffuse wheezes.  No crackles. ABDOMEN:  Positive bowel sounds.  Soft.  Diffuse tenderness to palpation. BACK:  No CVA tenderness. EXTREMITIES:  A 2+ pulses.  No edema. NEUROLOGIC:  Alert and oriented x3.  No focal deficits.  LABORATORY DATA AND STUDIES:  CBC with differential;  white blood cell count 31.4, hemoglobin 15.7, hematocrit 47.4, platelets 154, neutrophils 94%, lymphocytes 2%, monocytes 4%.  Lipase is 118.  Coag studies; PTT is 33, pro time 14.0, INR 1.06.  A complete metabolic panel; sodium 132, potassium 5.5, chloride 90, CO2 is 22, BUN 39, creatinine 12.6, glucose 128, calcium 9.3, total bilirubin is 2.9, direct bilirubin is 1.7, indirect bilirubin is 1.2, alkaline phosphatase 157, AST is 159, ALT is 64, total protein is 9.0, and albumin 3.4.  RADIOLOGY:  Abdominal ultrasound shows no gallstones or biliary ductal dilatation, small atrophic kidneys with numerous small cysts, no hydronephrosis.  ASSESSMENT AND PLAN:  This is a 55 year old man with multiple medical problems who presents with abdominal pain, question of  pancreatitis.  The patient also has a clotted hemodialysis graft. 1. Abdominal  pain.  Labs indicate that the patient has pancreatitis.     We will make the patient n.p.o.  We will give him IV Dilaudid for     his pain control.  We will give him IV fluids.  We will check LDH     so we can calculate a Ranson score.  We will also give him     antiemetics IV for nausea control.  A CT scan has been ordered to     evaluate the patient for pancreatic necrosis considering his white     count.  We will follow up with a CT scan and consider starting the     patient on antibiotics.  We will also continue a proton pump     inhibitor. 2. Occluded right groin hemodialysis graft.  Nephrology has evaluated     the patient and recommends thrombolysis per Interventional     Radiology.  We will order this study. 3. End-stage renal disease.  Nephrology is aware of the patient, and     they are following him for hemodialysis management. 4. Diabetes mellitus, type 2.  The patient has a history of diabetes     but is not taking medicines currently.  We will order a hemoglobin     A1c and order sliding scale insulin and CBG checks. 5. History of thyroidectomy.  The patient is not on any medications     but has a history of thyroidectomy.  Unclear if his entire thyroid     was removed or just partial thyroidectomy.  We will check a TSH. 6. Fluids, electrolytes, and nutrition/gastrointestinal.  We will give     the patient normal saline at 125 mL/hour.  We will make him n.p.o.     except medications. 7. Prophylaxis.  We will place the patient on subcu heparin. 8. Code status.  The patient wishes to be a full code. 9. Disposition is pending clinical improvement.    ______________________________ Ardyth Gal, MD   ______________________________ Paula Compton, MD    CR/MEDQ  D:  06/03/2010  T:  06/04/2010  Job:  [id number]  Electronically Signed by Ardyth Gal MD on 06/16/2010 03:25:51 PM Electronically Signed by Paula Compton MD on 06/23/2010 09:47:30 AM

## 2010-06-29 NOTE — Consult Note (Addendum)
[name], [name]                 ACCOUNT NO.:  [id number]  MEDICAL RECORD NO.:  [id number]           PATIENT TYPE:  I  LOCATION:  6736                         FACILITY:  [facility]  PHYSICIAN:  Juleen China IV, MDDATE OF BIRTH:  January 14, 1956  DATE OF CONSULTATION:  06/05/2010 DATE OF DISCHARGE:                                CONSULTATION   ADMIT DIAGNOSIS:  Abdominal pain.  HISTORY OF PRESENT ILLNESS:  The patient is a 55 year old African American male with known end-stage renal disease, diabetes mellitus, hypertension, and hepatitis C who presented to the emergency room on June 03, 2010, for evaluation of abdominal pain.  After extensive evaluation, the patient was found to have pancreatitis and was admitted for treatment of this.  The patient has a right thigh  AV graft that was placed in 2006 with a thrombectomy and revision in 2007.  The patient dialyses on Monday, Wednesday, and Friday.  The patient states that his thigh graft has been clotted since Monday, June 02, 2010.  The patient had a temporary dialysis catheter placed by Interventional Radiology on June 04, 2010.  The patient has also been found to have a gram- negative rod bacteremia and is on cefepime day 2.  Secondary to this development, the Infectious Disease service was consulted who recommended an evaluation by the Vascular Surgery group to determine if the thigh graft was a source of infection.  The patient currently denies fevers, chills, headaches, chest pain, palpitations, shortness of breath, nausea, or vomiting.  He also denies pain in the right groin.  PAST MEDICAL HISTORY AND PAST SURGICAL HISTORY: 1. End-stage renal disease dialysis on Monday, Wednesday, and Friday. 2. Diabetes mellitus type 2. 3. Hypertension. 4. Cardiomyopathy. 5. Hepatitis C. 6. Gout. 7. Thyroidectomy. 8. COPD. 9. History of substance abuse. 10.Tobacco abuse. 11.Status post multiple AV graft for dialysis.  The most  recent graft     placed in the right thigh in 2006. 12.Pancreatitis. 13.Gram-negative rod bacteremia.  ALLERGIES:  No known drug allergies.  MEDICATIONS ON ADMISSION: 1. Sodium bicarb 650 mg p.o. b.i.d. 2. Oxycodone 5 mg p.o. t.i.d. 3. Renvela 2.4 g p.o. t.i.d. a.c. 4. Neurontin 100 mg p.o. at bedtime. 5. Colchicine 0.6 mg p.o. b.i.d. 6. Tramadol 50 mg q.8 h. p.o. p.r.n. 7. Nexium 40 mg p.o. at bedtime. 8. Atarax 25 mg p.o. t.i.d. 9. Advil 200 mg p.o. q.6 h. p.r.n. 10.Aspirin 325 mg p.o. daily. 11.PhosLo 667 mg 4 tablets p.o. t.i.d. a.c. 12.Viagra 100 mg p.o. daily p.r.n. 13.Nephro-Vite 1 tablet p.o. daily.  SOCIAL HISTORY:  The patient lives with his girlfriend and is unemployed.  He has smoked one pack a day for many years.  The patient denies current alcohol use, but has a significant history of use in the past.  The patient also denies current drug use, but has a history of cocaine use in the past.  REVIEW OF SYSTEMS:  A complete review of systems is negative.  Please see HPI for pertinent positive and negative.  PHYSICAL EXAMINATION:  GENERAL:  The patient is resting comfortably in the bed.  He is in no  acute distress. HEENT:  Normocephalic and atraumatic.  PERRLA.  EOMI. CARDIOVASCULAR:  Regular rate and rhythm. LUNGS:  Clear to auscultation. ABDOMEN:  Obese, soft, nontender, nondistended with active bowel sounds. MUSCULOSKELETAL:  There are 2+ radial and femoral pulses palpable. SKIN:  No evidence of cyanosis or rashes.  There are no open wounds noted.  There is a right thigh AVG graft in place with no evidence of open wounds, skin breakdown, fluctuance, or drainage.  No thrill or bruit in the graft  No erythema is noted. NEUROLOGIC:  Intact.  No focal deficits.  LABORATORY DATA:  CBC and BMP on June 05, 2010, white count is 10.8, hemoglobin 13.7, hematocrit 41.8, and platelets 127.  Sodium 133, potassium 4.5, BUN 40, and creatinine 10.95.  ASSESSMENT:   Clotted right thigh graft in a patient with current diagnosis of pancreatitis and bacteremia on antibiotics.  PLAN:  An orders was written for the vascular lab to ultrasound of the right thigh graft tomorrow morning to eval for fluid collections around the graft.  If no fluid collections are documented, there is a very small chance of the graft being  infected.  The patient is on the schedule with orders to have a declot by Interventional Radiology on June 08, 2010.  This plan will be determined and will continued by the primary service.     Pecola Leisure, PA   ______________________________ V. Charlena Cross, MD   AY/MEDQ  D:  06/05/2010  T:  06/06/2010  Job:  [id number]  Electronically Signed by Pecola Leisure PA on 06/09/2010 09:59:39 AM Electronically Signed by Arelia Longest IV MD on 06/29/2010 07:39:50 PM

## 2010-07-03 NOTE — Consult Note (Signed)
NAMEIZIK, BINGMAN NO.:  [id number]  MEDICAL RECORD NO.:  [id number]           PATIENT TYPE:  LOCATION:                                 FACILITY:  PHYSICIAN:  Acey Lav, MD  DATE OF BIRTH:  1956-01-11  DATE OF CONSULTATION: DATE OF DISCHARGE:                                CONSULTATION   REQUESTING PHYSICIAN:  Paula Compton, MD, Family Medicine.  REASON FOR INFECTION DISEASE CONSULTATION:  The patient with Gram- negative bacteremia.  HISTORY OF PRESENT ILLNESS:  Mr. Borg is a 55 year old African American gentleman who presented to the emergency department on June 03, 2010, due to his right groin hemodialysis graft occluding.  He came to the emergency department where he also was complaining of abdominal pain and was worked up for this.  He was thought initially to have pancreatitis and was managed for this.  He did blood cultures obtained as well on admission because he was febrile and these have now subsequently grown Gram-negative rods, 2/2 cultures from admission, one is in the right arm and the other is in the right hand.  He is currently on cefepime and seems to have been improving.  He has had a new dialysis catheter placed in his left IJ.  His clot was to be declotted but this has not been done.  We are consulted to assist in the workup and management of this patient with Gram-negative bacteremia.  PAST MEDICAL HISTORY: 1. The patient did have history in 2007 of diskitis in the spine at     the L5-S1 region.  This area was aspirated, no bacteria were     cultured.  He therefore was sent home on vancomycin with dialysis     and ciprofloxacin and treated for 6 weeks.  He has continued to     have chronic back pain since then and has had imaging done from     time to time but not been treated for recurrent infection. 2. End-stage renal disease on hemodialysis. 3. Diabetes mellitus. 4. Hypertension. 5. Cardiomyopathy. 6. Hepatitis  C. 7. Gout. 8. Thyroidectomy. 9. COPD. 10.History of polysubstance abuse.  PAST SURGICAL HISTORY:  The patient did have multiple AV grafts being placed for hemodialysis.  SOCIAL HISTORY:  The patient lives with girlfriend.  He is unemployed. Smoking a pack of cigarettes per day.  Denies alcohol use.  He has prior use of cocaine history.  REVIEW OF SYSTEMS:  As described in the history of present illness, otherwise 12-point review of systems pertinent for abdominal pain, fevers and chills.  Also, chronic low back pain.  Otherwise, 12-point review of systems negative.  ALLERGIES:  No known drug allergies.  CURRENT MEDICATIONS:  Aspirin, calcium acetate, cefepime, chlorhexidine, heparin, oxycodone, pantoprazole, calcium carbonate, docusate, Sarna, Ambien, sorbitol.  PHYSICAL EXAMINATION:  VITAL SIGNS:  Temperature maximum last 24 hours is 98.7, temperature current 98.4, blood pressure 165/64, pulse 71, respirations 22, pulse ox 94% on room air. GENERAL:  A pleasant gentleman in no acute distress. HEENT:  Normocephalic, atraumatic.  Pupils equal and reactive to light. Sclerae icteric.  Oropharynx clear.  NECK:  Supple. CARDIOVASCULAR:  Regular rate and rhythm.  No murmurs, gallops, or rubs. LUNGS:  Clear to auscultation. ABDOMEN:  Soft, nondistended, nontender.  Positive bowel sounds. EXTREMITIES:  Edema 1+. SKIN:  He has right-sided AV graft which is without any thrill.  There is no fluctuance or purulence, or erythema.  His IJ site is clean, dry, intact in his neck. NEUROLOGIC:  Nonfocal.  LABORATORY DATA:  CT of the pelvis on June 03, 2010, showed stable porta hepatis and portacaval adenopathy unchanged since 2006, cystic renal disease on dialysis, atherosclerotic coronary disease, normal appendix, chronic diskitis and osteomyelitis changes of the L5-S1 which have not changed much compared to 2007 per Radiology.  Further laboratory data, metabolic panel, sodium 133,  potassium 4.5, chloride 94, bicarb 28, BUN and creatinine 40 and 10.95, glucose 130.  CBC differential, white count 10.5, hemoglobin 13.7, platelets 127,000.  MICROBIOLOGICAL DATA:  Blood cultures from June 03, 2010, 2/2 Gram- negative rods.  Blood cultures from February 2007, 2/2 no growth.  Blood cultures from abscess from disk space, no growth x2 days.  Catheter tip in 2006 grew Coag-negative staph which was resistant to clindamycin, erythromycin, sensitive to gentamicin, intermediate to levofloxacin, resistant to oxacillin and penicillin, sensitive to rifampin, vancomycin, tetracycline.  C. diff in 2006 negative.  Blood cultures from August 23, 2004, negative.  C. diff toxin in August 2006 negative.  IMPRESSION AND RECOMMENDATIONS:  This is a 55 year old African American male with Gram-negative bacteremia, recently clotted dialysis graft in his right groin and history of prior osteomyelitis and diskitis. 1. Gram-negative bacteremia:  Not clear what the source is.  I would     worry about his graft site and I would recommend consulting     Vascular Surgery.  One could also perform an in and out catheter to     see if he has urine there that still has bacteria that could be     cultured.  I am skeptical that will recover given he has been on     antibiotics already, but we can still attempt this.  Imaging of the     abdomen does not suggest an intra-abdominal source or abscess.     Again, I would be worried about the graft site since this is where     he was getting dialysis before and would be a portal of entry for     Gram-negative rod. 2. History of osteomyelitis in the lumbar spine.  It is reasonable to     get an MRI of the lumbar spine.  He has had chronic back pain.  I     do not think his acute Gram-negative rod bacteremia is related to     his osteomyelitis, although it is possible.  If his MRI shows     worsening diskitis and osteomyelitis, one could contemplate getting      an aspirate although he is already on antibiotics, so the yield     might be low.  If he has further evidence of diskitis, I would     probable want to broaden him to cover Gram-positive organisms as     well because I would not be certain that the acute Gram-negative     bacteremia was related to the osteomyelitis and diskitis. 3. We will screen for HIV. 4. History of methicillin-resistant Staphylococcus aureus boils.  We     will plan on contact precautions.       Acey Lav, MD  CV/MEDQ  D:  06/05/2010  T:  06/06/2010  Job:  [id number]  cc:   Verne Carrow, MD P. Liliane Bade, M.D.  Electronically Signed by Paulette Blanch DAM MD on 07/02/2010 11:24:02 AM

## 2010-07-04 ENCOUNTER — Other Ambulatory Visit (HOSPITAL_COMMUNITY): Payer: Self-pay | Admitting: Nephrology

## 2010-07-04 DIAGNOSIS — N186 End stage renal disease: Secondary | ICD-10-CM

## 2010-08-15 ENCOUNTER — Other Ambulatory Visit: Payer: Self-pay | Admitting: Nephrology

## 2010-08-15 ENCOUNTER — Ambulatory Visit
Admission: RE | Admit: 2010-08-15 | Discharge: 2010-08-15 | Disposition: A | Discharge status: Home / Self Care | Payer: Medicaid Other | Source: Ambulatory Visit | Attending: Nephrology | Admitting: Nephrology

## 2010-08-15 DIAGNOSIS — M79673 Pain in unspecified foot: Secondary | ICD-10-CM

## 2010-08-27 ENCOUNTER — Other Ambulatory Visit (HOSPITAL_COMMUNITY): Payer: Self-pay | Admitting: Nephrology

## 2010-08-27 DIAGNOSIS — N186 End stage renal disease: Secondary | ICD-10-CM

## 2010-09-02 ENCOUNTER — Ambulatory Visit (HOSPITAL_COMMUNITY)
Admission: RE | Admit: 2010-09-02 | Discharge: 2010-09-03 | Disposition: A | Discharge status: Home / Self Care | Payer: Medicare Other | Source: Ambulatory Visit | Attending: Vascular Surgery | Admitting: Vascular Surgery

## 2010-09-02 ENCOUNTER — Other Ambulatory Visit (HOSPITAL_COMMUNITY): Payer: Self-pay | Admitting: Nephrology

## 2010-09-02 ENCOUNTER — Ambulatory Visit (HOSPITAL_COMMUNITY)
Admission: RE | Admit: 2010-09-02 | Discharge: 2010-09-02 | Disposition: A | Discharge status: Home / Self Care | Payer: Medicare Other | Source: Ambulatory Visit | Attending: Nephrology | Admitting: Nephrology

## 2010-09-02 DIAGNOSIS — N186 End stage renal disease: Secondary | ICD-10-CM

## 2010-09-02 DIAGNOSIS — T82898A Other specified complication of vascular prosthetic devices, implants and grafts, initial encounter: Secondary | ICD-10-CM | POA: Insufficient documentation

## 2010-09-02 DIAGNOSIS — N189 Chronic kidney disease, unspecified: Secondary | ICD-10-CM | POA: Insufficient documentation

## 2010-09-02 DIAGNOSIS — I724 Aneurysm of artery of lower extremity: Secondary | ICD-10-CM | POA: Insufficient documentation

## 2010-09-02 DIAGNOSIS — E669 Obesity, unspecified: Secondary | ICD-10-CM | POA: Insufficient documentation

## 2010-09-02 DIAGNOSIS — Z992 Dependence on renal dialysis: Secondary | ICD-10-CM | POA: Insufficient documentation

## 2010-09-02 DIAGNOSIS — Y849 Medical procedure, unspecified as the cause of abnormal reaction of the patient, or of later complication, without mention of misadventure at the time of the procedure: Secondary | ICD-10-CM | POA: Insufficient documentation

## 2010-09-02 DIAGNOSIS — I129 Hypertensive chronic kidney disease with stage 1 through stage 4 chronic kidney disease, or unspecified chronic kidney disease: Secondary | ICD-10-CM | POA: Insufficient documentation

## 2010-09-02 DIAGNOSIS — I509 Heart failure, unspecified: Secondary | ICD-10-CM | POA: Insufficient documentation

## 2010-09-02 MED ORDER — IOHEXOL 300 MG/ML  SOLN
100.0000 mL | Freq: Once | INTRAMUSCULAR | Status: AC | PRN
  Administered 2010-09-02: 50 mL via INTRAVENOUS

## 2010-09-03 ENCOUNTER — Ambulatory Visit (HOSPITAL_COMMUNITY): Payer: Medicare Other

## 2010-09-03 DIAGNOSIS — N186 End stage renal disease: Secondary | ICD-10-CM

## 2010-09-03 DIAGNOSIS — I120 Hypertensive chronic kidney disease with stage 5 chronic kidney disease or end stage renal disease: Secondary | ICD-10-CM

## 2010-09-03 DIAGNOSIS — T82898A Other specified complication of vascular prosthetic devices, implants and grafts, initial encounter: Secondary | ICD-10-CM

## 2010-09-03 LAB — POCT I-STAT 4, (NA,K, GLUC, HGB,HCT)
Glucose, Bld: 100 mg/dL — ABNORMAL HIGH (ref 70–99)
Glucose, Bld: 141 mg/dL — ABNORMAL HIGH (ref 70–99)
HCT: 51 % (ref 39.0–52.0)
Potassium: 5.7 mEq/L — ABNORMAL HIGH (ref 3.5–5.1)

## 2010-09-03 LAB — GLUCOSE, CAPILLARY: Glucose-Capillary: 161 mg/dL — ABNORMAL HIGH (ref 70–99)

## 2010-09-03 LAB — SURGICAL PCR SCREEN
MRSA, PCR: NEGATIVE
Staphylococcus aureus: NEGATIVE

## 2010-09-12 NOTE — Op Note (Signed)
[name], [name] NO.:  [id number]   MEDICAL RECORD NO.:  [id number]          PATIENT TYPE:  OIB   LOCATION:  2899                         FACILITY:  [facility]   PHYSICIAN:  Janetta Hora. Fields, MD  DATE OF BIRTH:  [birth year]   DATE OF PROCEDURE:  11/17/2004  DATE OF DISCHARGE:  11/17/2004                                 OPERATIVE REPORT   PROCEDURES:  1.  Right neck ultrasound.  2.  Insertion of right internal jugular vein 23 cm Diatek catheter.   PREOPERATIVE DIAGNOSIS:  End-stage renal disease.   POSTOPERATIVE DIAGNOSIS:  End-stage renal disease.   ANESTHESIA:  General.   FINDINGS:  1.  A 23 cm Diatek catheter, right internal jugular vein.  2.  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   ESTIMATED BLOOD LOSS:  200 mL.   OPERATIVE DETAIL:  After obtaining informed consent, the patient was taken  to the operating room. Patient was placed in the supine position on the  operating table. After induction of general anesthesia endotracheal  intubation, the patient's entire neck and chest were prepped and draped in  the usual sterile fashion. Ultrasound was used to localize the right  internal jugular vein. This had normal compressibility and respiratory  variation. Next, local anesthesia was infiltrated over the right internal  jugular vein. A small finder needle was used to cannulate the right internal  jugular vein and a larger introducer needle was used to cannulate the right  internal jugular vein. A 0.035 guidewire was then advanced into the right  internal jugular vein. This would not advance easily down into the superior  vena cava. Therefore, it was advanced into the right subclavian vein. Next,  a 5-French end-hole catheter was placed over the guidewire down into the  subclavian vein. The 5-French end-hole catheter was gradually pulled back. A  0.035 angled Glidewire was then slowly advanced through the end-hole  catheter and I was able to advance  this into the right internal jugular vein  and down into the inferior vena cava on fluoroscopic guidance. The end-hole  catheter was then advanced down the inferior vena cava and the Glidewire was  removed. A 0.035 J-tipped guidewire was then threaded down into the inferior  vena cava through the end-hole catheter. End-hole catheter was then removed.  Sequential 12, 14, and 16-French dilators were then placed over the  guidewire into the right atrium. The 12 dilator passed fairly easily.  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 I was able with gentle  pressure to finally get the 16-French dilator and peel-away sheath to pass  into the right atrium. The dilator was then removed and a 23 cm Diatek  catheter was then threaded over the guidewire into the right atrium and the  peel-away sheath removed. Catheter was noted to flush and draw easily. Next,  the catheter was tunneled subcutaneously, cut to length, and the hub  attached. Catheter was then inspected under fluoroscopy. There was slight  kinking just before the exit  to the skin. With some gentle traction, this  kink was pulled out of the catheter. The catheter was then noted to flush  and draw easily. The catheter was loaded with concentrated heparin solution.  The catheter was then sutured to the skin with nylon sutures. The neck  insertion  site was also closed with a nylon suture. The patient tolerated the  procedure well. There were no complications. Estimated blood loss was 200  mL. There were no complications. Instrument, sponge, and needle counts were  correct at the end of the case. The patient was taken to the recovery room  in stable condition.       CEF/MEDQ  D:  11/17/2004  T:  11/18/2004  Job:  [id number]

## 2010-09-12 NOTE — Consult Note (Signed)
[name], [name] NO.:  [id number]   MEDICAL RECORD NO.:  [id number]          PATIENT TYPE:  INP   LOCATION:  5532                         FACILITY:  [facility]   PHYSICIAN:  Graylin Shiver, M.D.   DATE OF BIRTH:  25-Aug-1955   DATE OF CONSULTATION:  08/28/2004  DATE OF DISCHARGE:                                   CONSULTATION   REASON FOR CONSULTATION:  The patient is a 55 year old black male with  multiple medical problems, who developed lower abdominal pain bilaterally  one week ago; it continues, although it is getting better. A CT of the  abdomen was done along with the pelvis, which showed an inflammatory process  to the left of the umbilicus with stranding of mesenteric and omental fat,  and mild mural thickening of several adjacent loops of small bowel. His  white blood cell count is elevated at 15.7. Hemoglobin is 11.4.  It was  thought that perhaps this CT abnormality was secondary to ischemia, and  therefore a CT angiogram was done. This did not show any abnormalities to  suggest mesenteric vessel occlusion.  It is questioned at that this point  whether the problem could be secondary to an infectious etiology versus  inflammatory bowel disease. The patient is currently on empiric Unasyn.   PAST HISTORY:  1.  End-stage renal disease on dialysis.  2.  Diabetes mellitus.  3.  Hypertension.  4.  History of substance abuse and tobacco abuse.  5.  Obesity.  6.  Cardiomyopathy.  7.  History of congestive heart failure.  8.  Gout.  9.  History of hepatitis C.  10. History of retroperitoneal abscess.  11. History of COPD.  12. History of hepatitis B.   MEDICATIONS PRIOR TO ADMISSION:  1.  Klonopin.  2.  Celebrex.  3.  PhosLo.  4.  Allopurinol.  5.  Viagra.  6.  Restoril.  7.  Benadryl.  8.  Nephro Vite.  9.  Epogen.   REVIEW OF SYSTEMS:  No complaints of anginal chest pain, shortness of  breath, sputum production.   ALLERGIES:  None known.   PHYSICAL EXAMINATION:  GENERAL:  He does not appear in any acute distress.  HEENT:  He is not icteric.  HEART:  Regular rhythm. No murmurs.  LUNGS:  Clear.  ABDOMEN:  Obese, soft, nontender at this time.   IMPRESSION:  Mesenteric and omental inflammatory process; question whether  this is of infectious etiology versus inflammatory bowel disease such as  Crohn's disease.   PLAN:  Proceed with repeating CT scan in a few days, as already ordered.  Supportive care. Empiric antibiotics.  In order to determine whether this  could be inflammatory bowel disease such as Crohn's disease, I will order a  blood test (called the IBD First-Step) this is done by the reference lab  [facility].  This can give [place] the predictive information as to whether  or not this could be inflammatory bowel disease such as Crohn's disease, and  it can be helpful in such situations as this.  SFG/MEDQ  D:  08/28/2004  T:  08/28/2004  Job:  [id number]

## 2010-09-12 NOTE — Op Note (Signed)
[name], [name]                 ACCOUNT NO.:  [id number]   MEDICAL RECORD NO.:  [id number]          PATIENT TYPE:  OIB   LOCATION:  5504                         FACILITY:  [facility]   PHYSICIAN:  Balinda Quails, M.D.    DATE OF BIRTH:  10-11-1955   DATE OF PROCEDURE:  01/31/2004  DATE OF DISCHARGE:                                 OPERATIVE REPORT   PREOPERATIVE DIAGNOSES:  1.  End stage renal failure.  2.  Clotted right upper arm arteriovenous graft.   POSTOPERATIVE DIAGNOSES:  1.  End stage renal failure.  2.  Clotted right upper arm arteriovenous graft.  3.  Infected right upper arm arteriovenous graft.   OPERATION PERFORMED:  1.  Removal of infected right upper arm graft.  2.  Bovie patch angioplasty and repair, right brachial artery.   SURGEON:  Balinda Quails, M.D.   ASSISTANT:  Coral Ceo, P.A.   ANESTHESIA:  General endotracheal.   DESCRIPTION OF PROCEDURE:  The patient was brought to the operating room in  stable condition.  Placed in supine position.  General endotracheal  anesthesia induced.  Right arm prepped and draped in the sterile fashion.  A  longitudinal skin incision was made through the scar in the right axilla.  Dissection carried down through the subcutaneous tissue.  There was a fluid  collection surrounding the graft with old blood.  This was entered.  The  graft was not well incorporated.  There was a slimy coating of biofilm on  the graft.  This was cultured for aerobic bacteria.  The graft was poorly  incorporated and the majority of the graft was quite mobile.  The graft  could not be adequately thrombectomized.  It was apparent that the graft was  infected.  The graft followed down to the anastomosis to the axillary vein.  The graft taken off the axillary vein which was oversewn with running 4-0  Prolene suture.  A second skin incision was then made over the arterial  anastomosis.  Dissection carried down to expose the arterial anastomosis.  The  brachial artery controlled proximally and distally.  The patient  administered 3000 units of heparin intravenously.  The brachial artery  controlled proximally and distally with bulldog clamps.  The arterial  anastomosis taken down.  The graft easily slipped out of the tissues.  Graft  trimmed completely off of the artery.  The artery repaired with a Bovine  patch using running 6-0 Prolene suture.  Clamps were then removed.  Excellent flow present.  Adequate hemostasis obtained.  Sponge and  instrument counts were correct.   Subcutaneous tissue closed in both incisions with two layers of running 3-0  Vicryl.  Skin closed with 4-0 Monocryl.  Steri-Strips were applied.  The  patient tolerated the procedure well.  Transferred to the recovery room in  stable condition.       PGH/MEDQ  D:  02/01/2004  T:  02/01/2004  Job:  [id number]

## 2010-09-12 NOTE — Op Note (Signed)
NAMELENIN, KUHNLE NO.:  [id number]   MEDICAL RECORD NO.:  [id number]          PATIENT TYPE:  INP   LOCATION:  5502                         FACILITY:  [facility]   PHYSICIAN:  Di Kindle. Edilia Bo, M.D.DATE OF BIRTH:  [birth year]   DATE OF PROCEDURE:  04/07/2005  DATE OF DISCHARGE:                                 OPERATIVE REPORT   PREOPERATIVE DIAGNOSIS:  Chronic renal failure.   POSTOPERATIVE DIAGNOSIS:  Chronic renal failure.   PROCEDURE:  Placement of the right thigh arteriovenous graft.   SURGEON:  Di Kindle. Edilia Bo, M.D.   ASSISTANT:  Nurse.   ANESTHESIA:  General.   TECHNIQUE:  The patient was taken to the operating room and received a  general anesthetic.  The right groin was prepped and draped in the usual  sterile fashion.  He had a fairly large abdomen and his pannus was taped  superiorly.  An oblique incision was made in the right groin and through  this incision the common femoral, superficial femoral and deep femoral  artery were dissected free and controlled with vessel loops.  Through the  same incision the proximal saphenous vein was dissected free down to the  level of the saphenofemoral junction.  Using one distal counterincision, a 4-  7 mm graft was then tunneled in a loop fashion in the thigh and the patient  was then heparinized.  The common femoral, superficial femoral and deep  femoral arteries were clamped and then a longitudinal arteriotomy made over  the distal common femoral artery extending onto the superficial femoral  artery.  A segment of the 4-mm end of graft was excised and the graft  spatulated and sewn end to side to the distal common femoral artery and  proximal superficial femoral artery using a continuous 6-0 Prolene suture.  Of note, there was no significant plaque in the superficial femoral artery  proximally.  There was an excellent pulse at this level.  The graft was then  pulled to the appropriate length for  anastomosis to the saphenous vein.  The  vein was ligated distally, spatulated and then the graft was cut to the  appropriate length, spatulated and sewn end-to-end to the vein using  continuous 6-0 Prolene suture.  At completion there was excellent thrill in  the graft.  Hemostasis was obtained in the wounds and the heparin was  partially reversed with protamine.  The distal counterincision was closed  with a deep layer of 3-0 Vicryl and the skin closed with 4-0 Vicryl.  The  groin incision was closed with a deep layer of 3-0 Vicryl and the  subcutaneous layer with 3-0 Vicryl.  The skin was closed with 4-0  subcuticular stitch.  A sterile dressing was applied.  The patient tolerated  the procedure well and was transferred to the recovery room in satisfactory  condition.  All needle and sponge counts were correct.      Di Kindle. Edilia Bo, M.D.  Electronically Signed    CSD/MEDQ  D:  04/07/2005  T:  04/08/2005  Job:  [id number]

## 2010-09-12 NOTE — H&P (Signed)
NAMETYLERJAMES, HOGLUND NO.:  [id number]   MEDICAL RECORD NO.:  [id number]          PATIENT TYPE:  OIB   LOCATION:  5504                         FACILITY:  [facility]   PHYSICIAN:  Balinda Quails, M.D.    DATE OF BIRTH:  16-Mar-1956   DATE OF ADMISSION:  01/31/2004  DATE OF DISCHARGE:                                HISTORY & PHYSICAL   CHIEF COMPLAINT:  Thrombosed right upper arm arteriovenous Gore-Tex graft.   HISTORY OF PRESENT ILLNESS:  Mr. Caspers is a 55 year old African-American  male with end-stage renal disease and is status post multiple failed  hemodialysis accesses.  He most recently underwent placement of a new right  upper arm AVGG on November 27, 2003 by Dr. Arbie Cookey.  This week, the graft was  found to be clotted and he was scheduled for a thrombectomy and possible  revision by Dr. Madilyn Fireman on January 31, 2004.  However, intraoperatively the  graft was found to be infected and it was removed.  The brachial artery was  repaired with a bovine patch angioplasty.  Based on these findings, it was  felt he should be admitted for IV vancomycin with plans for new Diatek  placement within the next 24-48 hours.  He had had no fever or chills prior  to surgery.   PAST MEDICAL HISTORY:  1.  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  He      began hemodialysis in 1997.  2.  Hypertension.  3.  Polysubstance abuse including cocaine, alcohol, and tobacco.  4.  Glucose intolerance with questionable progressed to diabetes mellitus      type 2.  5.  Obesity.  6.  Thyroidectomy in December 2000.  7.  Medical noncompliance.  8.  Cardiomyopathy with history of congestive heart failure.  9.  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  10. Gouty arthritis.  11. Hepatitis C since July 2000.  12. History of remote ankle fracture and dog bite on his right leg.  13. COPD.  14. Anemia.  15. Secondary hyperparathyroidism.  16. Bell's palsy  in 2001.  17. Status post multiple failed hemodialysis accesses in his bilateral upper      extremities.  18. Enterobacter sepsis in August 2004.   ALLERGIES:  No known drug allergies.   MEDICATIONS:  1.  Aleve one p.o. daily.  2.  PhosLo 667 mg four tablets p.o. t.i.d. with meals.  3.  Protonix 40 mg one p.o. daily.  4.  Allopurinol 100 mg one p.o. daily.  5.  Viagra 100 mg p.r.n.  6.  Restoril 50 mg one p.o. q.h.s. p.r.n.  7.  Benadryl 25 mg p.r.n.  8.  Nephro-Vite one p.o. daily.   SOCIAL HISTORY:  He currently smokes one to two packs of cigarettes per day  for the past 20 years.  He denies any current alcohol or illicit drug use.  He lives alone.  He has hemodialysis at the [facility].   REVIEW OF SYSTEMS:  He denies fever, chills, chest pain, shortness of  breath,  or hematochezia.   PHYSICAL EXAMINATION:  VITAL SIGNS:  Temperature 97, respirations 20, heart  rate 84, blood pressure 111/69, oxygen saturation 98% on room air.  GENERAL:  He is alert and oriented in no acute distress.  HEENT:  His head is normocephalic and atraumatic.  His pupils are equal.  His oral mucous membranes are pink and moist.  He does have poor dentition  with no upper teeth and several missing lower teeth.  NECK:  Large but no lymphadenopathy or gross thyromegaly was noted.  He does  have weakly palpable carotid pulses but no bruits ausculted.  HEART:  Regular rate and rhythm.  No murmur or rub are noted.  However,  heart sounds were rather distant.  LUNGS:  Clear throughout but diminished in the bases.  ABDOMEN:  His abdomen is soft and obese.  It is nontender and nondistended  with hypoactive bowel sounds.  EXTREMITIES:  His extremities are warm without pitting edema.  He does have  1+ radial and posterior tibial pulses bilaterally.  He has 2+ dorsalis pedis  pulses.  NEUROLOGIC:  He is alert and oriented x4.  His speech is clear.  His gait  was unobserved.  His strength was  grossly symmetrical.   IMPRESSION:  Mr. Roam is a 55 year old African-American male with end-  stage renal disease status post removal of infected right upper arm  arteriovenous Gore-Tex graft.   PLAN:  He will be admitted to [facility] under the care of Dr. Denman George.  The renal service will be contacted for assistance with his  renal disease.  He will be started on IV vancomycin with plans for a new  Diatek catheter on February 01, 2004.  Dr. Madilyn Fireman has seen and examined this  patient prior to this admission and has explained the risks and benefits of  the above procedure, and he has agreed to proceed.       AWZ/MEDQ  D:  02/01/2004  T:  02/01/2004  Job:  [id number]   cc:   [employer]

## 2010-09-12 NOTE — Op Note (Signed)
NAMEAIDYNN, KRENN NO.:  [id number]   MEDICAL RECORD NO.:  [id number]          PATIENT TYPE:  OUT   LOCATION:  MDC                          FACILITY:  [facility]   PHYSICIAN:  Larina Earthly, M.D.    DATE OF BIRTH:  [birth year]   DATE OF PROCEDURE:  06/11/2004  DATE OF DISCHARGE:  06/11/2004                                 OPERATIVE REPORT   PREOPERATIVE DIAGNOSIS:  End-stage renal disease.   POSTOPERATIVE DIAGNOSIS:  End-stage renal disease.   PROCEDURE:  Bilateral ultrasound visualization of jugular veins and placed  of left internal jugular Diatech catheter.   SURGEON:  Larina Earthly, M.D.   ASSISTANT:  Nurse.   ANESTHESIA:  General endotracheal.   COMPLICATIONS:  None, to recovery room stable.   PROCEDURE IN DETAIL:  The patient was taken to the operating room and placed  on the operating table where the right and left neck were ultrasound  visualized.  The patient had a widely patent left jugular vein with a small  right jugular vein.  The patient had removal of the right IJ catheter with  probable infection the day prior to this procedure.  With the patient in  Trendelenburg position, using local anesthesia and a finder needle, the left  internal jugular vein was identified.  Next, using the Seldinger technique,  a guidewire was passed down to the level of the right atrium.  A dilator and  peel away sheath were passed over the guidewire and the dilator and  guidewire were removed.  The 32-cm Diatech catheter was passed down through  the peel away sheath which was then removed.  The catheter was positioned at  the level of the distal right atrium.  The catheter was then brought through  a subcutaneous tunnel and the two lumen ports were attached.  Both of them  flushed and aspirated easily, __________ per mL heparin.  The catheter was  secured to the skin with a 3-0 nylon stitch.  The entry site was also closed  with a 3-0 nylon stitch.  Next, the patient  had had a possible retained cuff  with removal of the Diatech catheter in outpatient yesterday and while under  general anesthesia this area was probed with a hemostat and indeed the cuff  was in the subcuticular tissue and this was removed in its entirety.  A  sterile dressing was applied and the patient was taken to the recovery room  where a chest x-ray was obtained.      TFE/MEDQ  D:  06/11/2004  T:  06/11/2004  Job:  [id number]
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
## 2010-09-12 NOTE — Op Note (Signed)
[name], [name] NO.:  [id number]   MEDICAL RECORD NO.:  [id number]            PATIENT TYPE:   LOCATION:                                 FACILITY:   PHYSICIAN:  Balinda Quails, M.D.         DATE OF BIRTH:   DATE OF PROCEDURE:  06/15/2005  DATE OF DISCHARGE:                                 OPERATIVE REPORT   SURGEON:  Denman George, MD   ASSISTANT:  Constance Holster, PA.   ANESTHETIC:  General endotracheal.   PREOPERATIVE DIAGNOSES:  1.  End-stage renal failure.  2.  Clotted right thigh arteriovenous graft.   POSTOPERATIVE DIAGNOSES:  1.  End-stage renal failure.  2.  Clotted right thigh arteriovenous graft.   PROCEDURE:  Thrombectomy and revision right thigh arteriovenous graft.   OPERATIVE PROCEDURE:  The patient brought to the operating room in stable  condition. Placed in the supine position under general endotracheal  anesthesia. Right leg prepped and draped in a sterile fashion.   An oblique skin incision made through the scar in the right groin.  Dissection carried down through subcutaneous tissue. The venous limb of the  graft followed down to an end-to-end anastomosis to the proximal saphenous  vein. The saphenous vein mobilized down to the saphenofemoral junction. The  common femoral vein freed adequately. A partial occlusion clamp placed on  the common femoral vein. The saphenofemoral junction excised and a venotomy  extended longitudinally under the common femoral vein. The graft then  thrombectomized several times with a 4 and 5 Fogarty catheter. Excellent  inflow obtained. The graft filled with heparin-saline solution, controlled  with a fistula clamp.   The graft divided transversely across venous limb. A new segment of 6-mm  Gore-Tex was anastomosed end-to-end to divided limb of the graft using a  running 6-0 Prolene suture. This was then beveled and anastomosed end-to-  side to the common femoral vein, using a running 6-0 Prolene  suture. Clamps  were then removed. Excellent flow was present. Adequate hemostasis was  obtained. Sponge and instrument counts were correct. Wound irrigated with  saline solution. Deep subcutaneous layer closed with a running 2-0 Vicryl  suture. Superficial subcutaneous layer closed with a running 3-0 Vicryl  suture. Skin closed with 4-0 Monocryl. Steri-Strips applied.   The patient tolerated the procedure well. No apparent complications.  Transferred to recovery in stable condition.      Balinda Quails, M.D.  Electronically Signed     PGH/MEDQ  D:  06/15/2005  T:  06/15/2005  Job:  [id number]

## 2010-09-12 NOTE — Consult Note (Signed)
[name], [name] NO.:  [id number]   MEDICAL RECORD NO.:  [id number]          PATIENT TYPE:  INP   LOCATION:  3009                         FACILITY:  [facility]   PHYSICIAN:  Payton Doughty, M.D.      DATE OF BIRTH:  [birth year]   DATE OF CONSULTATION:  06/09/2005  DATE OF DISCHARGE:                                   CONSULTATION   I was called to see this 55 year old, right-handed black gentleman with  about a 66-month history of waxing and waning low back pain without lower  extremity complaints.  It has been worse over the past 2 weeks in his back.  About a month ago, he had a bout of gram negative sepsis.   MEDICAL HISTORY:  1.  End-stage renal disease.  He is on dialysis.  2.  He also has a history of substance abuse.   He uses Flexeril; aspirin; Lyrica; Klonopin; Celebrex; Protonix;  allopurinol; Viagra; and Nephro-Vite.   He also smokes.   PHYSICAL EXAMINATION:  GENERAL:  He is awake, alert, and oriented,  complaining of low back pain.  There is a minimal amount of pain to  percussion.  NEUROLOGIC:  He is oriented x3.  He complains of being nervous.  His pupils  equal, round, and reactive to light.  Extraocular muscles are intact.  Facial movement and sensation intact.  Tongue protrudes in the midline.  Shoulder shrug is normal.  Motor exam shows 5/5 strength throughout the  upper and lower extremities.  There is no current sensory deficit.  Reflexes  are 1 at the knees absent at the ankles.  Toes are downgoing bilaterally.   REVIEW OF HIS FILMS:  He had a CT in October 2006 that showed degenerative  disc disease with question of lytic lesions at L5-S1 disc space.  X-ray  yesterday, June 08, 2005, showed a questionable osteomyelitis at L5-S1,  and CT of June 09, 2005, which is today, showed a questionable  osteomyelitis at L5-S1, without neurologic encroachment.   ASSESSMENT:  Question of possible osteomyelitis at L5-S1.  He is currently  not  septic.  I would recommend a CT-guided aspirate of the L5-S1 interspace  for cultures, place him in a lumbar corset for comfort.  If the cultures are  positive, I would recommend ID consult for further treatment.  He currently  does not need any operative intervention.           ______________________________  Payton Doughty, M.D.     MWR/MEDQ  D:  06/09/2005  T:  06/10/2005  Job:  [id number]

## 2010-09-12 NOTE — Discharge Summary (Signed)
[name], [name] NO.:  [id number]   MEDICAL RECORD NO.:  [id number]          PATIENT TYPE:  INP   LOCATION:  5502                         FACILITY:  [facility]   PHYSICIAN:  Di Kindle. Edilia Bo, M.D.DATE OF BIRTH:  08-Apr-1956   DATE OF ADMISSION:  04/07/2005  DATE OF DISCHARGE:  04/08/2005                                 DISCHARGE SUMMARY   HISTORY OF PRESENT ILLNESS:  This is a 55 year old male with a history of  end-stage renal disease who was admitted for placement of a right thigh AV  Gore-Tex graft by Dr. Edilia Bo.   PAST MEDICAL HISTORY:  1.  End-stage renal disease.  2.  Diabetes mellitus.  3.  Hypertension.  4.  Cardiomyopathy.  5.  History of substance abuse.  6.  History of obesity.  7.  History of BHR.  8.  History of gouty arthritis.  9.  History of hepatitis C.  10. History of mesenteric and omental inflammatory process.   ALLERGIES:  NO KNOWN DRUG ALLERGIES.   MEDICATIONS:  1.  Ultram 50 mg q.12h. p.r.n.  2.  Klonopin 0.5 mg nightly.  3.  Celebrex 200 mg b.i.d.  4.  PhosLo 667 mg four tablets t.i.d. before meals.  5.  Protonix 40 mg h.s.  6.  Allopurinol 100 mg.  7.  Viagra p.r.n.  8.  Nephro-Vite daily.   For family history, social history, review of systems and physical  examination please see the history and physical done at the time of  admission.   HOSPITAL COURSE:  The patient was admitted and a right thigh AV Gore-Tex  graft was placed by Dr. Edilia Bo.  This was tolerated well and he was taken  to the post anesthesia care unit.   POSTOPERATIVE HOSPITAL COURSE:  Patient did fine.  He was seen in  consultation by the renal service for management of his dialysis and medical  issues.  There was no new issues related to his medical status.  He had  dialysis on postoperative day #1 morning.  He tolerated this well and was  felt to be stable for discharge following this.   DISCHARGE INSTRUCTIONS:  The patient received written  instructions in regard  to medications, activity, diet, wound care and follow-up.   FOLLOW UP:  Kidney physicians as they require with routine dialysis.  CVTS  was to arrange for an office visit with the appointment to be called to him.   MEDICATIONS:  Unchanged from preoperatively.   FINAL DIAGNOSES:  1.  Status post right femoral AV Gore-Tex graft placement.  2.  Other diagnoses as previously listed per the history.   CONDITION ON DISCHARGE:  Stable.      Rowe Clack, P.A.-C.      Di Kindle. Edilia Bo, M.D.  Electronically Signed    WEG/MEDQ  D:  07/14/2005  T:  07/15/2005  Job:  [id number]   cc:   [facility]

## 2010-09-12 NOTE — Op Note (Signed)
[name], [name]                 ACCOUNT NO.:  [id number]   MEDICAL RECORD NO.:  [id number]          PATIENT TYPE:  OIB   LOCATION:  5504                         FACILITY:  [facility]   PHYSICIAN:  Balinda Quails, M.D.    DATE OF BIRTH:  [birth year]   DATE OF PROCEDURE:  02/01/2004  DATE OF DISCHARGE:                                 OPERATIVE REPORT   PREOPERATIVE DIAGNOSIS:  End stage renal failure.   POSTOPERATIVE DIAGNOSIS:  End stage renal failure.   OPERATION PERFORMED:  1.  Ultrasound localization of bilateral internal jugular vein.  2.  Right internal jugular Diatek catheter insertion.   SURGEON:  Balinda Quails, M.D.   ASSISTANT:  Nurse.   ANESTHESIA:  Local with MAC.   DESCRIPTION OF PROCEDURE:  The patient was brought to the operating room in  stable condition.  Placed in supine position.  Ultrasound of the lower neck  carried out.  This revealed bilateral jugular veins to be patent with normal  compressibility and respiratory variation.  The neck was then prepped and  draped in sterile fashion.  Skin and subcutaneous tissue at the base of the  right neck were instilled with 1% Xylocaine.  A needle was easily introduced  to the right internal jugular vein.  Initial attempt made pass a J-wire  through the needle was unsuccessful.  The J-wire was exchanged for an angled  Glide wire.  This was then passed under fluoroscopy into the right  ventricle.   An 11 blade used to open the guidewire site.  12, 14, and 16 dilators  advanced over the guidewire.  The guidewire exchanged for a 0.035 J-wire.  A  16 French dilator and tear-away sheath advanced over the guidewire.  The  dilator and guidewire removed.  Catheter placed through the sheath and  positioned at the superior vena cava right atrial junction.  Sheath removed.  A subcutaneous tunnel then created.  Catheter brought through the tunnel.  The hub mechanism assembled.  The catheter flushed with heparin saline  solution.   The catheter was capped with heparin.  The insertion site closed  with interrupted 3-0 nylon suture.  The catheter then affixed to the skin  with interrupted 2-0 silk suture.  Sterile dressings applied.  The patient  tolerated the procedure well.  Transferred to the recovery room in stable  condition.       PGH/MEDQ  D:  02/01/2004  T:  02/01/2004  Job:  [id number]

## 2010-09-12 NOTE — Discharge Summary (Signed)
[name], [name] NO.:  [id number]   MEDICAL RECORD NO.:  [id number]          PATIENT TYPE:  INP   LOCATION:  5506                         FACILITY:  [facility]   PHYSICIAN:  Wilber Bihari. Caryn Section, M.D.   DATE OF BIRTH:  [birth year]   DATE OF ADMISSION:  06/08/2005  DATE OF DISCHARGE:  06/17/2005                                 DISCHARGE SUMMARY   DISCHARGE DIAGNOSES:  1.  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  2.  End-stage renal disease, continue hemodialysis on Monday, Wednesday,      Thursday, and Friday.  3.  Right femoral atrioventricular graft, thrombectomy, and revision.  4.  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  5.  Secondary hyperparathyroidism.  6.  Gout.   DISCHARGE MEDICATIONS:  1.  Aspirin 325 mg one tablet daily.  2.  Lyrica 25 mg one tablet p.o. daily.  3.  Clonazepam 3.5 mg one tablet p.o. q.h.s.  4.  Celebrex 200 mg one tablet p.o. b.i.d.  5.  PhosLo 667 mg four tablets t.i.d. q.a.c.  6.  Nexium one tablet daily.  7.  Allopurinol 100 mg one tablet daily.  8.  NephroVite one tablet daily.  9.  Epogen 3000 units IV three times per week with hemodialysis.  10. InFeD 150 mg IV every Friday.  11. Vancomycin 1 g IV every Monday, Wednesday, and Friday.  Treatment length      six weeks starting on June 16, 2005.  12. Ciprofloxacin 250 mg one tablet b.i.d.  Treatment length six weeks      starting on June 16, 2005.  13. Percocet 5/325 mg one to two tablets every four to six hours p.r.n.      pain.  14. Flexeril 10 mg one tablet q.8h. as needed.   HOSPITAL COURSE:  Problem 1.  [name] is a 55 year old African  American male who was admitted on June 08, 2005 with a chief complaint  of severe back pain, with history of marked stenosis, multi-level, on a CT  scan performed in October of 2006.  The patient had a neurological  evaluation during his hospitalization by a neurologist who  ordered a CT-  guided aspiration biopsy of L5-S1 and culture.  The aspirated fluid did not  grow out any pathology, but the patient was empirically treated for  osteomyelitis with vancomycin and ciprofloxacin.   Problem 2.  End-stage renal disease.  The patient was maintained on his  regularly scheduled hemodialysis without any change of Epogen.  His EDW at  discharge was 122 kg.   Problem 3.  __________ right femoral AV graft, clotted.  The patient had AV  graft __________ on June 11, 2005 and thrombectomy and revision of same  graft on June 15, 2005.  At discharge, AV graft functioning.  Instructions:  Use graft during three cycles of hemodialysis and then if  functioning, remove right IJ catheter.   Problem 4.  Anemia.  Stable during hospitalization.  Did not require any  blood transfusion or change of Epogen  dosage.   LABORATORY DATA AT DISCHARGE:  Sodium 132, potassium 4.8, chloride 96, CO2  20, glucose 102, BUN 66, creatinine 14.4, albumin 3, calcium 8.6,  phosphorous 6.1. White blood cells 11.5, hemoglobin 11.3, hematocrit 33.3,  platelets 181.  Blood cultures x2 performed on June 16, 2005 did not  show any growth to date.  Culture from low back aspirate did not show any  organisms on the gram stain and did not show any growth on the final report.   IMAGING STUDIES:  Right hip, two-view.  Impression is stable __________  change without acute abnormality.  Chest x-ray did show mild perihilar  interstitial infiltrate edema and right IJ hemodialysis catheter in stable  position.   DISCHARGE INSTRUCTIONS:  PPD placed on June 16, 2005 to be read on  June 18, 2005 at hemodialysis center.  Vancomycin level is to be checked  as needed at hemodialysis center, pre-hemodialysis level.  Goal is 15-25.      Adrian Blackwater, MD    ______________________________  Wilber Bihari. Caryn Section, M.D.    IM/MEDQ  D:  06/17/2005  T:  06/18/2005  Job:  [id number]   cc:    Dyke Maes, M.D.  Fax: [phone]   [facility]

## 2010-09-16 NOTE — Op Note (Signed)
[name], PITTA NO.:  [id number]  MEDICAL RECORD NO.:  [id number]           PATIENT TYPE:  O  LOCATION:  SDSC                         FACILITY:  [facility]  PHYSICIAN:  Janetta Hora. Arieh Bogue, MD  DATE OF BIRTH:  05/21/1955  DATE OF PROCEDURE:  09/03/2010 DATE OF DISCHARGE:                              OPERATIVE REPORT   PROCEDURE:  Thrombectomy and revision right thigh AV graft.  PREOPERATIVE DIAGNOSIS:  Thrombosed atrioventricular graft, right thigh.  POSTOPERATIVE DIAGNOSIS:  Thrombosed atrioventricular graft, right thigh.  ANESTHESIA:  General.  SURGEON:  Fines Kimberlin E. Willliam Pettet, MD  ASSISTANT:  Pecola Leisure, PA  OPERATIVE FINDINGS:  Pseudointimal narrowing of venous anastomosis revised with 6-mm interposition graft.  OPERATIVE COUNTS:  After obtaining informed consent, the patient was taken to the operating room.  The patient was placed in supine position on the operating table.  After induction of general anesthesia and endotracheal intubation, the patient's entire right anterior thigh and upper abdomen were prepped and draped in usual sterile fashion.  Due to the patient's large pannus, this was taped prior to prepping and draping the pannus out of the operative field.  Next, an oblique incision was made through a preexisting scar in the right groin.  Incision was carried down through the subcutaneous tissues down to the level of the graft.  The graft was dissected free circumferentially.  This was palpated and found be the arterial limb of the graft.  A vessel loop was placed around this.  Attention was then turned more medially and the venous limb of the graft was dissected free circumferentially.  Both the arterial and venous limbs were well incorporated.  Dissection was carried down onto the level of the venous anastomosis.  There were dense scar and adhesions to this.  Also due to the patient's body habitus, the dissection was fairly deep and  tedious.  Dissection was carried all the way down onto the level of the common femoral artery.  The portion of the external oblique aponeurosis was taken down and the distal external iliac vein/common femoral artery was dissected free circumferentially right at the level of the inguinal ligament.  A vessel loop was placed around this.  Several small side branches were ligated and divided between silk ties.  Next, the common femoral artery was dissected free circumferentially just below the level of the venous anastomosis.  The anterior two-third surface was prepared for clamping.  The posterior portion of the vein was not dissected free circumferentially.  The patient was given 10,000 units of intravenous heparin.  A transverse graftotomy was made just above the level of the venous anastomosis. There was thrombus within the graft.  This was removed and there was some venous backbleeding.  However, there was definitely narrowing near the venous anastomosis.  A Henley clamp was used to control the distal external iliac vein and a peripheral DeBakey clamp used to control the common femoral vein.  The venous anastomosis was taken down and a thickened portion of intimal hyperplasia debrided away.  The venotomy was then extended further up onto the common femoral artery to make  sure there was a nice wide venotomy.  A 6-mm interposition graft was brought in the operative field and beveled and sewn end to graft to side of vein using a running 6-0 Prolene suture.  At completion of anastomosis, there was vigorous backbleeding from this.  This was thoroughly flushed with heparinized saline and reoccluded with a fistula clamp just above the level of the venous anastomosis.  Next, #4 and 5 Fogarty catheters were used to thrombectomize the arterial limb of the graft from the venous side.  Multiple passes were made.  There was a small pseudoaneurysm along the venous aspect of the graft and the catheter  became trapped in this several times, but with some pressure was able to manipulate the catheter up to the arterial side and I was able to pass the catheter up to 70 cm without difficulty.  Multiple passes were made until all thrombotic material was removed, and there was excellent arterial inflow, and there was good pulsatile inflow and no further debris was obtained from the thrombectomy catheter.  Clean pass was obtained. Graft was occluded proximally, and a 6-mm interposition graft cut to length and sewn end-to-end to the old graft using a running 6-0 Prolene suture.  Just prior to completion of anastomosis, this was forebled, backbled, and thoroughly flushed.  Anastomosis was secured, clamps were released.  The pulse within the graft was fairly diminished.  Therefore, I decided to explore the arterial end of the graft.  Dissection was carried out around the arterial limb of the graft all the way to the arterial anastomosis but not through this.  Transverse graftotomy was then made just above the level of the arterial anastomosis and this was inspected and found to actually have fairly vigorous arterial bleeding. Despite this, #4 and 5 Fogarty catheters were passed through the arterial anastomosis.  These were passed freely and there was excellent arterial inflow and no further thrombus was obtained.  This was clamped proximally.  The catheter was then passed down the venous limb of the graft from the arterial side.  Multiple passes were made.  No thrombotic material was removed.  There was excellent venous backbleeding.  The graftotomy was then repaired using a running 6-0 Prolene suture.  Just prior to completion of anastomosis, this was forebled, backbled, and thoroughly flushed.  Anastomosis was secured, clamps were released. There was pulsatile flow in the graft immediately.  The patient's pressure was right at 100 mm systolic and the __________ was consistent with this.  There  was also good Doppler flow into the femoral vein.  At this point, hemostasis was obtained with administration of 100 mg of protamine.  The groin was closed in multiple layers with running 3-0 Vicryl suture and the skin closed with 4-0 Vicryl subcuticular stitch and Dermabond applied.  The patient tolerated the procedure well, and there were no complications.  Instrument, sponge, and needle counts were correct at the end of the case.  The patient was taken to the recovery room in stable condition.     Janetta Hora. Joan Herschberger, MD     CEF/MEDQ  D:  09/03/2010  T:  09/04/2010  Job:  [id number]  Electronically Signed by Fabienne Bruns MD on 09/16/2010 08:24:40 AM

## 2010-10-10 ENCOUNTER — Other Ambulatory Visit (HOSPITAL_COMMUNITY): Payer: Self-pay | Admitting: Nephrology

## 2010-10-10 DIAGNOSIS — I878 Other specified disorders of veins: Secondary | ICD-10-CM

## 2010-10-16 ENCOUNTER — Other Ambulatory Visit (HOSPITAL_COMMUNITY): Payer: Medicare Other

## 2011-02-23 ENCOUNTER — Other Ambulatory Visit (HOSPITAL_COMMUNITY): Payer: Self-pay | Admitting: Nephrology

## 2011-02-23 DIAGNOSIS — N186 End stage renal disease: Secondary | ICD-10-CM

## 2011-03-12 ENCOUNTER — Other Ambulatory Visit (HOSPITAL_COMMUNITY): Payer: Self-pay | Admitting: Nephrology

## 2011-03-12 ENCOUNTER — Ambulatory Visit (HOSPITAL_COMMUNITY)
Admission: RE | Admit: 2011-03-12 | Discharge: 2011-03-12 | Disposition: A | Discharge status: Home / Self Care | Payer: Medicare Other | Source: Ambulatory Visit | Attending: Nephrology | Admitting: Nephrology

## 2011-03-12 DIAGNOSIS — Y849 Medical procedure, unspecified as the cause of abnormal reaction of the patient, or of later complication, without mention of misadventure at the time of the procedure: Secondary | ICD-10-CM | POA: Insufficient documentation

## 2011-03-12 DIAGNOSIS — N186 End stage renal disease: Secondary | ICD-10-CM | POA: Insufficient documentation

## 2011-03-12 DIAGNOSIS — T82898A Other specified complication of vascular prosthetic devices, implants and grafts, initial encounter: Secondary | ICD-10-CM | POA: Insufficient documentation

## 2011-03-12 MED ORDER — IOHEXOL 300 MG/ML  SOLN
100.0000 mL | Freq: Once | INTRAMUSCULAR | Status: AC | PRN
  Administered 2011-03-12: 50 mL via INTRAVENOUS

## 2011-03-12 NOTE — Procedures (Signed)
R thigh shuntogram R venous PTA  No complications No blood loss

## 2011-03-26 ENCOUNTER — Other Ambulatory Visit (HOSPITAL_COMMUNITY): Payer: Self-pay | Admitting: Medical

## 2011-03-26 ENCOUNTER — Emergency Department (HOSPITAL_COMMUNITY)
Admission: EM | Admit: 2011-03-26 | Discharge: 2011-03-27 | Disposition: A | Discharge status: Home / Self Care | Payer: Medicare Other | Source: Home / Self Care | Attending: Emergency Medicine | Admitting: Emergency Medicine

## 2011-03-26 ENCOUNTER — Encounter (HOSPITAL_COMMUNITY): Payer: Self-pay

## 2011-03-26 ENCOUNTER — Ambulatory Visit (HOSPITAL_COMMUNITY)
Admission: RE | Admit: 2011-03-26 | Discharge: 2011-03-26 | Disposition: A | Discharge status: Home / Self Care | Payer: Medicare Other | Source: Ambulatory Visit | Attending: Nephrology | Admitting: Nephrology

## 2011-03-26 ENCOUNTER — Other Ambulatory Visit (HOSPITAL_COMMUNITY): Payer: Self-pay | Admitting: Nephrology

## 2011-03-26 ENCOUNTER — Encounter (HOSPITAL_COMMUNITY): Payer: Self-pay | Admitting: Emergency Medicine

## 2011-03-26 DIAGNOSIS — T82898A Other specified complication of vascular prosthetic devices, implants and grafts, initial encounter: Secondary | ICD-10-CM | POA: Insufficient documentation

## 2011-03-26 DIAGNOSIS — F411 Generalized anxiety disorder: Secondary | ICD-10-CM | POA: Insufficient documentation

## 2011-03-26 DIAGNOSIS — F17200 Nicotine dependence, unspecified, uncomplicated: Secondary | ICD-10-CM | POA: Insufficient documentation

## 2011-03-26 DIAGNOSIS — Z79899 Other long term (current) drug therapy: Secondary | ICD-10-CM | POA: Insufficient documentation

## 2011-03-26 DIAGNOSIS — N289 Disorder of kidney and ureter, unspecified: Secondary | ICD-10-CM | POA: Insufficient documentation

## 2011-03-26 DIAGNOSIS — M549 Dorsalgia, unspecified: Secondary | ICD-10-CM | POA: Insufficient documentation

## 2011-03-26 DIAGNOSIS — M79609 Pain in unspecified limb: Secondary | ICD-10-CM | POA: Insufficient documentation

## 2011-03-26 DIAGNOSIS — N186 End stage renal disease: Secondary | ICD-10-CM

## 2011-03-26 DIAGNOSIS — M129 Arthropathy, unspecified: Secondary | ICD-10-CM | POA: Insufficient documentation

## 2011-03-26 DIAGNOSIS — I10 Essential (primary) hypertension: Secondary | ICD-10-CM | POA: Insufficient documentation

## 2011-03-26 DIAGNOSIS — I82890 Acute embolism and thrombosis of other specified veins: Secondary | ICD-10-CM | POA: Insufficient documentation

## 2011-03-26 DIAGNOSIS — Z7982 Long term (current) use of aspirin: Secondary | ICD-10-CM | POA: Insufficient documentation

## 2011-03-26 DIAGNOSIS — M1990 Unspecified osteoarthritis, unspecified site: Secondary | ICD-10-CM | POA: Insufficient documentation

## 2011-03-26 DIAGNOSIS — Y832 Surgical operation with anastomosis, bypass or graft as the cause of abnormal reaction of the patient, or of later complication, without mention of misadventure at the time of the procedure: Secondary | ICD-10-CM | POA: Insufficient documentation

## 2011-03-26 DIAGNOSIS — R58 Hemorrhage, not elsewhere classified: Secondary | ICD-10-CM

## 2011-03-26 DIAGNOSIS — K219 Gastro-esophageal reflux disease without esophagitis: Secondary | ICD-10-CM | POA: Insufficient documentation

## 2011-03-26 DIAGNOSIS — Z9889 Other specified postprocedural states: Secondary | ICD-10-CM | POA: Insufficient documentation

## 2011-03-26 DIAGNOSIS — Z96649 Presence of unspecified artificial hip joint: Secondary | ICD-10-CM | POA: Insufficient documentation

## 2011-03-26 DIAGNOSIS — Y841 Kidney dialysis as the cause of abnormal reaction of the patient, or of later complication, without mention of misadventure at the time of the procedure: Secondary | ICD-10-CM | POA: Insufficient documentation

## 2011-03-26 DIAGNOSIS — I509 Heart failure, unspecified: Secondary | ICD-10-CM | POA: Insufficient documentation

## 2011-03-26 HISTORY — DX: Polyneuropathy, unspecified: G62.9

## 2011-03-26 HISTORY — DX: Dorsalgia, unspecified: M54.9

## 2011-03-26 HISTORY — DX: Anxiety disorder, unspecified: F41.9

## 2011-03-26 HISTORY — DX: Gastro-esophageal reflux disease without esophagitis: K21.9

## 2011-03-26 HISTORY — DX: Nicotine dependence, unspecified, uncomplicated: F17.200

## 2011-03-26 HISTORY — DX: Unspecified osteoarthritis, unspecified site: M19.90

## 2011-03-26 LAB — POTASSIUM: Potassium: 5.3 mEq/L — ABNORMAL HIGH (ref 3.5–5.1)

## 2011-03-26 MED ORDER — MIDAZOLAM HCL 5 MG/5ML IJ SOLN
INTRAMUSCULAR | Status: AC | PRN
  Administered 2011-03-26 (×2): 2 mg via INTRAVENOUS

## 2011-03-26 MED ORDER — SODIUM CHLORIDE 0.9 % IV SOLN
INTRAVENOUS | Status: AC | PRN
  Administered 2011-03-26: 50 mL/h via INTRAVENOUS

## 2011-03-26 MED ORDER — FENTANYL CITRATE 0.05 MG/ML IJ SOLN
INTRAMUSCULAR | Status: AC | PRN
  Administered 2011-03-26 (×3): 50 ug via INTRAVENOUS

## 2011-03-26 MED ORDER — ALTEPLASE 100 MG IV SOLR
2.0000 mg | Freq: Once | INTRAVENOUS | Status: AC
  Administered 2011-03-26: 2 mg
  Filled 2011-03-26: qty 2

## 2011-03-26 MED ORDER — MIDAZOLAM HCL 2 MG/2ML IJ SOLN
INTRAMUSCULAR | Status: AC
  Filled 2011-03-26: qty 6

## 2011-03-26 MED ORDER — ALTEPLASE 2 MG IJ SOLR: 2.0000 mg | Freq: Once | INTRAMUSCULAR | Status: DC

## 2011-03-26 MED ORDER — FENTANYL CITRATE 0.05 MG/ML IJ SOLN
INTRAMUSCULAR | Status: AC
  Filled 2011-03-26: qty 6

## 2011-03-26 MED ORDER — HEPARIN SODIUM (PORCINE) 1000 UNIT/ML IJ SOLN
INTRAMUSCULAR | Status: AC
  Administered 2011-03-26: 3000 [IU] via INTRAVENOUS
  Filled 2011-03-26: qty 1

## 2011-03-26 MED ORDER — IOHEXOL 300 MG/ML  SOLN
100.0000 mL | Freq: Once | INTRAMUSCULAR | Status: AC | PRN
  Administered 2011-03-26: 50 mL via INTRAVENOUS

## 2011-03-26 NOTE — ED Notes (Signed)
Pt waiting on a ride to take him home.  A Friend came to pick him up. Both parties notified that pt is not allowed to drive tonight.

## 2011-03-26 NOTE — H&P (Signed)
[name] is an 55 y.o. male.   Chief Complaint: Declot Right femoral graft HPI: Dialysis yesterday. Pt. Noted problem with graft last night. Here for declot today.  Past Medical History  Diagnosis Date  . Renal insufficiency   . HTN (hypertension)   . Anxiety   . Back pain   . GERD (gastroesophageal reflux disease)     Past Surgical History  Procedure Date  . Total hip arthroplasty   . Thyroidectomy   . Tooth extraction     No family history on file. Social History:  does not have a smoking history on file. He does not have any smokeless tobacco history on file. His alcohol and drug histories not on file.  Allergies: Allergies not on file  No current outpatient prescriptions on file as of 03/26/2011.   Medications Prior to Admission  Medication Dose Route Frequency Provider Last Rate Last Dose  . alteplase (ACTIVASE) injection 2 mg  2 mg Intracatheter Once Dyke Maes, MD      . DISCONTD: alteplase (CATHFLO ACTIVASE) injection 2 mg  2 mg Intracatheter Once Dayne Oley Balm III        No results found for this or any previous visit (from the past 48 hour(s)). No results found.  Review of Systems  Constitutional: Negative for fever and chills.  Respiratory: Negative for shortness of breath.   Cardiovascular: Negative for chest pain.    Blood pressure 105/58, pulse 90, temperature 98.6 F (37 C), temperature source Oral, height 5\' 11"  (1.803 m), weight 282 lb (127.914 kg), SpO2 98.00%. Physical Exam  Airway -2 Heart - RRR distant Lungs - decreased but clear Right lower ext graft - no thrill   Assessment/Plan  Dialysis graft right femoral artery. No palpable thrill. Previous angioplasty 1-2 weeks ago. Here for declot today. Informed consent obtained.  [name] 03/26/2011, 12:15 PM

## 2011-03-26 NOTE — ED Notes (Signed)
Pt had some bleeding from graft.  Pressured held to site and bleeding stopped.  Bleeding came from an old insertion site.  New dressings applied.

## 2011-03-26 NOTE — Procedures (Signed)
R thigh AVG declot Venous anast 7mm PTA No complication Min blood loss. See complete dictation in [facility].

## 2011-03-26 NOTE — ED Notes (Signed)
PT. REPORTS BLEEDING AT AV GRAFT SURGERY  AT RIGHT THIGH THIS AFTERNOON  , SLIGHT PAIN .

## 2011-03-27 ENCOUNTER — Other Ambulatory Visit: Payer: Self-pay | Admitting: *Deleted

## 2011-03-27 ENCOUNTER — Ambulatory Visit (HOSPITAL_COMMUNITY)
Admission: RE | Admit: 2011-03-27 | Discharge: 2011-03-27 | Disposition: A | Discharge status: Home / Self Care | Payer: Medicare Other | Source: Ambulatory Visit | Attending: Nephrology | Admitting: Nephrology

## 2011-03-27 ENCOUNTER — Encounter (HOSPITAL_COMMUNITY): Payer: Self-pay | Admitting: *Deleted

## 2011-03-27 ENCOUNTER — Other Ambulatory Visit (HOSPITAL_COMMUNITY): Payer: Self-pay | Admitting: Nephrology

## 2011-03-27 ENCOUNTER — Encounter (HOSPITAL_COMMUNITY): Payer: Self-pay

## 2011-03-27 DIAGNOSIS — M549 Dorsalgia, unspecified: Secondary | ICD-10-CM | POA: Insufficient documentation

## 2011-03-27 DIAGNOSIS — Z992 Dependence on renal dialysis: Secondary | ICD-10-CM | POA: Insufficient documentation

## 2011-03-27 DIAGNOSIS — I120 Hypertensive chronic kidney disease with stage 5 chronic kidney disease or end stage renal disease: Secondary | ICD-10-CM | POA: Insufficient documentation

## 2011-03-27 DIAGNOSIS — N186 End stage renal disease: Secondary | ICD-10-CM

## 2011-03-27 DIAGNOSIS — T82898A Other specified complication of vascular prosthetic devices, implants and grafts, initial encounter: Secondary | ICD-10-CM | POA: Insufficient documentation

## 2011-03-27 DIAGNOSIS — F411 Generalized anxiety disorder: Secondary | ICD-10-CM | POA: Insufficient documentation

## 2011-03-27 DIAGNOSIS — K219 Gastro-esophageal reflux disease without esophagitis: Secondary | ICD-10-CM | POA: Insufficient documentation

## 2011-03-27 DIAGNOSIS — F17200 Nicotine dependence, unspecified, uncomplicated: Secondary | ICD-10-CM | POA: Insufficient documentation

## 2011-03-27 DIAGNOSIS — Y832 Surgical operation with anastomosis, bypass or graft as the cause of abnormal reaction of the patient, or of later complication, without mention of misadventure at the time of the procedure: Secondary | ICD-10-CM | POA: Insufficient documentation

## 2011-03-27 MED ORDER — ALTEPLASE 100 MG IV SOLR: 2.0000 mg | Freq: Once | INTRAVENOUS | Status: DC

## 2011-03-27 MED ORDER — MIDAZOLAM HCL 5 MG/5ML IJ SOLN
INTRAMUSCULAR | Status: AC | PRN
  Administered 2011-03-27: 1 mg via INTRAVENOUS
  Administered 2011-03-27 (×2): 0.5 mg via INTRAVENOUS

## 2011-03-27 MED ORDER — MIDAZOLAM HCL 2 MG/2ML IJ SOLN
INTRAMUSCULAR | Status: AC
  Filled 2011-03-27: qty 4

## 2011-03-27 MED ORDER — ALTEPLASE 100 MG IV SOLR
2.0000 mg | INTRAVENOUS | Status: AC
  Administered 2011-03-27: 1 mg
  Filled 2011-03-27: qty 2

## 2011-03-27 MED ORDER — FENTANYL CITRATE 0.05 MG/ML IJ SOLN
INTRAMUSCULAR | Status: AC
  Filled 2011-03-27: qty 4

## 2011-03-27 MED ORDER — SODIUM CHLORIDE 0.9 % IV SOLN
INTRAVENOUS | Status: AC | PRN
  Administered 2011-03-27: 50 mL/h via INTRAVENOUS

## 2011-03-27 MED ORDER — SODIUM CHLORIDE 0.9 % IV SOLN: INTRAVENOUS | Status: DC

## 2011-03-27 MED ORDER — FENTANYL CITRATE 0.05 MG/ML IJ SOLN
INTRAMUSCULAR | Status: AC | PRN
  Administered 2011-03-27: 50 ug via INTRAVENOUS
  Administered 2011-03-27 (×2): 25 ug via INTRAVENOUS

## 2011-03-27 MED ORDER — DEXTROSE 5 % IV SOLN
1.5000 g | INTRAVENOUS | Status: AC
  Administered 2011-03-28: 1.5 g via INTRAVENOUS
  Filled 2011-03-27: qty 1.5

## 2011-03-27 MED ORDER — IOHEXOL 300 MG/ML  SOLN
100.0000 mL | Freq: Once | INTRAMUSCULAR | Status: AC | PRN
  Administered 2011-03-27: 50 mL via INTRAVENOUS

## 2011-03-27 MED ORDER — HEPARIN SODIUM (PORCINE) 1000 UNIT/ML IJ SOLN
INTRAMUSCULAR | Status: AC
  Administered 2011-03-27: 1000 [IU]
  Filled 2011-03-27: qty 1

## 2011-03-27 NOTE — ED Notes (Signed)
Complaining of r shoulder pain due to position on table. Repositioned.

## 2011-03-27 NOTE — Progress Notes (Signed)
Pulse not as strong in graft. Able to dopple and hear pulse. Per pt unable to also palpate pulse. Pam Turbiin PA notified. Will come and assess.

## 2011-03-27 NOTE — Progress Notes (Signed)
Patient states he is going to drive himself home. Patient received sedation during his procedure- this was explained to as the reason that he could not drive himself home. Patient states, "I am driving myself home and I will sign anything I have to."  AMA form signed by patient. Dr. Denny Levy notified.

## 2011-03-27 NOTE — ED Notes (Signed)
D. Zeyfang aware potassium 4.5.

## 2011-03-27 NOTE — Progress Notes (Signed)
Patient ID: [name], male   DOB: 29-Jul-1955, 55 y.o.   MRN: [id number]  Right thigh graft thrombolysis performed today. Successful-- But, within 1 hr the good pulse/thrill is barely detectable with doppler.  Thrombolysis was also performed 11/29; successful also- but pt had site bleed at home and came to  ER and later clotted which is why pt has returned today.  Discussed with Dr Miles Costain.  He instructed me to call D Zeyfang PAC and suggest surgical referral/eval.

## 2011-03-27 NOTE — ED Notes (Signed)
Transfer to Short Stay with RN. Dressings right thigh clean dry and intact on arrival.

## 2011-03-27 NOTE — ED Notes (Signed)
No rx given, pt voiced understanding to f/u with dialysis today.

## 2011-03-27 NOTE — ED Provider Notes (Signed)
History     CSN: [id number] Arrival date & time: 03/26/2011  9:50 PM   First MD Initiated Contact with Patient 03/27/11 0005     Chief Complaint  Patient presents with  . Post-op Problem    (Consider location/radiation/quality/duration/timing/severity/associated sxs/prior treatment) HPI  Subjective chief complaint is bleeding from surgical site right thigh History of present illness: Onset today after surgery today and was sent home after right thigh AV graft. After patient was discharged home he knows bleeding at that site with some discomfort. We presented share, a dressing was placed and has had no further bleeding. No weakness or numbness. No dizziness. Mild pain. Sharp in quality. No radiation. Bleeding was mild and now resolved on my evaluation. No aggravating factors. Bleeding alleviated by resting and direct pressure. No other complaints  Past Medical History  Diagnosis Date  . Renal insufficiency   . HTN (hypertension)   . Anxiety   . Back pain   . GERD (gastroesophageal reflux disease)   . Peripheral neuropathy   . Arthritis   . CHF (congestive heart failure)   . Active smoker     Past Surgical History  Procedure Date  . Total hip arthroplasty   . Thyroidectomy   . Tooth extraction   . Mandible fracture surgery   . Dg av dialysis graft declot or     No family history on file.  History  Substance Use Topics  . Smoking status: Current Everyday Smoker  . Smokeless tobacco: Not on file  . Alcohol Use: No      Review of Systems  Constitutional: Negative for fever and chills.  HENT: Negative for neck pain and neck stiffness.   Eyes: Negative for pain.  Respiratory: Negative for shortness of breath.   Cardiovascular: Negative for chest pain, palpitations and leg swelling.  Gastrointestinal: Negative for abdominal pain.  Genitourinary: Negative for dysuria.  Musculoskeletal: Negative for back pain.  Skin: Negative for rash.  Neurological: Negative for  headaches.  All other systems reviewed and are negative.    Allergies  Review of patient's allergies indicates no known allergies.  Home Medications   Current Outpatient Rx  Name Route Sig Dispense Refill  . ASPIRIN 325 MG PO TBEC Oral Take 325 mg by mouth daily.      Marland Kitchen NEPHRO-VITE 0.8 MG PO TABS Oral Take 0.8 mg by mouth at bedtime.      Marland Kitchen CALCIUM ACETATE 667 MG PO CAPS Oral Take 2,668 mg by mouth 3 (three) times daily with meals.      . COLCHICINE 0.6 MG PO TABS Oral Take 0.6 mg by mouth 2 (two) times daily as needed. For gout     . ESOMEPRAZOLE MAGNESIUM 40 MG PO CPDR Oral Take 40 mg by mouth every evening.      Marland Kitchen GABAPENTIN 100 MG PO CAPS Oral Take 100 mg by mouth at bedtime.      Marland Kitchen HYDROXYZINE HCL 25 MG PO TABS Oral Take 25 mg by mouth every 8 (eight) hours as needed. For itching     . IBUPROFEN 200 MG PO TABS Oral Take 200 mg by mouth every 6 (six) hours as needed. For pain     . OXYCODONE HCL 5 MG PO TABS Oral Take 5 mg by mouth 3 (three) times daily.      Marland Kitchen SEVELAMER CARBONATE 2.4 G POWDER PKT Oral Take 2.4 g by mouth 3 (three) times daily with meals.      Marland Kitchen SILDENAFIL CITRATE 100 MG  PO TABS Oral Take 100 mg by mouth daily as needed. For erectile dysfunction      . SODIUM BICARBONATE 650 MG PO TABS Oral Take 650 mg by mouth 2 (two) times daily.      . TRAMADOL HCL 50 MG PO TABS Oral Take 50 mg by mouth every 8 (eight) hours as needed. For pain;Maximum dose= 8 tablets per day       BP 103/44  Pulse 84  Temp(Src) 97.6 F (36.4 C) (Oral)  Resp 20  SpO2 96%  Physical Exam  Constitutional: He is oriented to person, place, and time. He appears well-developed and well-nourished.  HENT:  Head: Normocephalic and atraumatic.  Eyes: Conjunctivae and EOM are normal. Pupils are equal, round, and reactive to light.  Neck: Trachea normal. Neck supple. No thyromegaly present.  Cardiovascular: Normal rate, regular rhythm, S1 normal, S2 normal and normal pulses.     No systolic  murmur is present   No diastolic murmur is present  Pulses:      Radial pulses are 2+ on the right side, and 2+ on the left side.  Pulmonary/Chest: Effort normal and breath sounds normal. He has no wheezes. He has no rhonchi. He has no rales. He exhibits no tenderness.  Abdominal: Soft. Normal appearance and bowel sounds are normal. There is no tenderness. There is no CVA tenderness and negative Murphy's sign.  Musculoskeletal:       Right lower extremity: Surgical site right groin area with bandage in place and no active bleeding. No pulsatile mass. Distal neurovascular is intact.  Neurological: He is alert and oriented to person, place, and time. He has normal strength. No cranial nerve deficit or sensory deficit. GCS eye subscore is 4. GCS verbal subscore is 5. GCS motor subscore is 6.  Skin: Skin is warm and dry. No rash noted. He is not diaphoretic.  Psychiatric: His speech is normal.       Cooperative and appropriate    ED Course  Procedures (including critical care time)  Labs Reviewed - No data to display Ir Pta Venous Right  03/26/2011  *RADIOLOGY REPORT*   Clinical data:  Occluded dialysis graft after extravasation and hypotension episode.  Previous venous anastomotic PTA 03/12/2011.  DIALYSIS GRAFT DECLOT VENOUS ANGIOPLASTY ULTRASOUND GUIDANCE FOR VASCULAR ACCESS X2  Comparison:  None available  Technique: The procedure, risks (including but not limited to bleeding, infection, organ damage), benefits, and alternatives were explained to the patient.  Questions regarding the procedure were encouraged and answered.  The patient understands and consents to the procedure.  Intravenous Fentanyl and Versed were administered as conscious sedation during continuous cardiorespiratory monitoring by the radiology RN, with a total moderate sedation time of 40 minutes.   The arterial limb of the graft was accessed antegrade with a 21- gauge micropuncture needle under real-time ultrasonic guidance  after the overlying skin prepped with Betadine, draped in usual sterile fashion, infiltrated locally with 1% lidocaine. Needle exchanged over 018 guidewire for transitional dilator through which 1 mg t-PA was administered. In similar fashion, the venous limb was accessed retrograde under ultrasound with a micropuncture needle, exchanged for a transitional dilator for  administration of 1 mg t- PA. Ultrasound imaging documentation was saved. Through the antegrade dilator, a Bentson wire was advanced to the venous anastomosis. Over this a 4F sheath was placed, through which a 5 [place] Kumpe catheter was advanced for outflow venography. This showed patency of the outflow  venous system through the IVC. 3000 units  heparin were administered. The Cleaner device was used to macerate thrombus in the graft. Injection showed   clearance of thrombus from the graft. Balloon angioplasty of the   venous anastomosis was performed using a 7 mm x 4 cm Conquest angioplasty balloon with good response.  Balloon was removed and injection showed patency of the anastomosis, without extravasation or other apparent complication. The venous limb dilator was exchanged in similar fashion for a 6 French vascular sheath. The Fogarty catheter was advanced across the arterial anastomosis and used to dislodge the platelet plug into the arterial limb of the graft, where it was macerated. The Fogarty device was used again to remove any residual platelet plug from the arterial anastomosis.  A follow shuntogram was performed, demonstrating good flow through the outflow vein, no extravasation. Reflux across the arterial anastomosis demonstrate this to be widely patent, with unremarkable appearance of the visualized native arterial circulation. The catheter and sheaths were then removed and hemostasis achieved with 2-0 Ethilon sutures. Patient tolerated procedure well.  IMPRESSION  1. Technically successful declot of right thigh loop synthetic  hemodialysis graft.  2. Technically successful balloon angioplasty of venous anastomotic stenosis.  Access management: Remains approachable for percutaneous intervention as needed.  Original Report Authenticated By: Osa Craver, M.D.   Ir Angio Av Shunt Addl Access  03/26/2011  *RADIOLOGY REPORT*   Clinical data:  Occluded dialysis graft after extravasation and hypotension episode.  Previous venous anastomotic PTA 03/12/2011.  DIALYSIS GRAFT DECLOT VENOUS ANGIOPLASTY ULTRASOUND GUIDANCE FOR VASCULAR ACCESS X2  Comparison:  None available  Technique: The procedure, risks (including but not limited to bleeding, infection, organ damage), benefits, and alternatives were explained to the patient.  Questions regarding the procedure were encouraged and answered.  The patient understands and consents to the procedure.  Intravenous Fentanyl and Versed were administered as conscious sedation during continuous cardiorespiratory monitoring by the radiology RN, with a total moderate sedation time of 40 minutes.   The arterial limb of the graft was accessed antegrade with a 21- gauge micropuncture needle under real-time ultrasonic guidance after the overlying skin prepped with Betadine, draped in usual sterile fashion, infiltrated locally with 1% lidocaine. Needle exchanged over 018 guidewire for transitional dilator through which 1 mg t-PA was administered. In similar fashion, the venous limb was accessed retrograde under ultrasound with a micropuncture needle, exchanged for a transitional dilator for  administration of 1 mg t- PA. Ultrasound imaging documentation was saved. Through the antegrade dilator, a Bentson wire was advanced to the venous anastomosis. Over this a 42F sheath was placed, through which a 5 [place] Kumpe catheter was advanced for outflow venography. This showed patency of the outflow  venous system through the IVC. 3000 units heparin were administered. The Cleaner device was used to macerate  thrombus in the graft. Injection showed   clearance of thrombus from the graft. Balloon angioplasty of the   venous anastomosis was performed using a 7 mm x 4 cm Conquest angioplasty balloon with good response.  Balloon was removed and injection showed patency of the anastomosis, without extravasation or other apparent complication. The venous limb dilator was exchanged in similar fashion for a 6 French vascular sheath. The Fogarty catheter was advanced across the arterial anastomosis and used to dislodge the platelet plug into the arterial limb of the graft, where it was macerated. The Fogarty device was used again to remove any residual platelet plug from the arterial anastomosis.  A follow shuntogram was performed, demonstrating good flow  through the outflow vein, no extravasation. Reflux across the arterial anastomosis demonstrate this to be widely patent, with unremarkable appearance of the visualized native arterial circulation. The catheter and sheaths were then removed and hemostasis achieved with 2-0 Ethilon sutures. Patient tolerated procedure well.  IMPRESSION  1. Technically successful declot of right thigh loop synthetic hemodialysis graft.  2. Technically successful balloon angioplasty of venous anastomotic stenosis.  Access management: Remains approachable for percutaneous intervention as needed.  Original Report Authenticated By: Osa Craver, M.D.   Ir US Guide Vasc Access Right  03/26/2011  *RADIOLOGY REPORT*   Clinical data:  Occluded dialysis graft after extravasation and hypotension episode.  Previous venous anastomotic PTA 03/12/2011.  DIALYSIS GRAFT DECLOT VENOUS ANGIOPLASTY ULTRASOUND GUIDANCE FOR VASCULAR ACCESS X2  Comparison:  None available  Technique: The procedure, risks (including but not limited to bleeding, infection, organ damage), benefits, and alternatives were explained to the patient.  Questions regarding the procedure were encouraged and answered.  The patient  understands and consents to the procedure.  Intravenous Fentanyl and Versed were administered as conscious sedation during continuous cardiorespiratory monitoring by the radiology RN, with a total moderate sedation time of 40 minutes.   The arterial limb of the graft was accessed antegrade with a 21- gauge micropuncture needle under real-time ultrasonic guidance after the overlying skin prepped with Betadine, draped in usual sterile fashion, infiltrated locally with 1% lidocaine. Needle exchanged over 018 guidewire for transitional dilator through which 1 mg t-PA was administered. In similar fashion, the venous limb was accessed retrograde under ultrasound with a micropuncture needle, exchanged for a transitional dilator for  administration of 1 mg t- PA. Ultrasound imaging documentation was saved. Through the antegrade dilator, a Bentson wire was advanced to the venous anastomosis. Over this a 50F sheath was placed, through which a 5 [place] Kumpe catheter was advanced for outflow venography. This showed patency of the outflow  venous system through the IVC. 3000 units heparin were administered. The Cleaner device was used to macerate thrombus in the graft. Injection showed   clearance of thrombus from the graft. Balloon angioplasty of the   venous anastomosis was performed using a 7 mm x 4 cm Conquest angioplasty balloon with good response.  Balloon was removed and injection showed patency of the anastomosis, without extravasation or other apparent complication. The venous limb dilator was exchanged in similar fashion for a 6 French vascular sheath. The Fogarty catheter was advanced across the arterial anastomosis and used to dislodge the platelet plug into the arterial limb of the graft, where it was macerated. The Fogarty device was used again to remove any residual platelet plug from the arterial anastomosis.  A follow shuntogram was performed, demonstrating good flow through the outflow vein, no extravasation.  Reflux across the arterial anastomosis demonstrate this to be widely patent, with unremarkable appearance of the visualized native arterial circulation. The catheter and sheaths were then removed and hemostasis achieved with 2-0 Ethilon sutures. Patient tolerated procedure well.  IMPRESSION  1. Technically successful declot of right thigh loop synthetic hemodialysis graft.  2. Technically successful balloon angioplasty of venous anastomotic stenosis.  Access management: Remains approachable for percutaneous intervention as needed.  Original Report Authenticated By: Osa Craver, M.D.   Ir Declot Right  03/26/2011  *RADIOLOGY REPORT*   Clinical data:  Occluded dialysis graft after extravasation and hypotension episode.  Previous venous anastomotic PTA 03/12/2011.  DIALYSIS GRAFT DECLOT VENOUS ANGIOPLASTY ULTRASOUND GUIDANCE FOR VASCULAR ACCESS X2  Comparison:  None available  Technique: The procedure, risks (including but not limited to bleeding, infection, organ damage), benefits, and alternatives were explained to the patient.  Questions regarding the procedure were encouraged and answered.  The patient understands and consents to the procedure.  Intravenous Fentanyl and Versed were administered as conscious sedation during continuous cardiorespiratory monitoring by the radiology RN, with a total moderate sedation time of 40 minutes.   The arterial limb of the graft was accessed antegrade with a 21- gauge micropuncture needle under real-time ultrasonic guidance after the overlying skin prepped with Betadine, draped in usual sterile fashion, infiltrated locally with 1% lidocaine. Needle exchanged over 018 guidewire for transitional dilator through which 1 mg t-PA was administered. In similar fashion, the venous limb was accessed retrograde under ultrasound with a micropuncture needle, exchanged for a transitional dilator for  administration of 1 mg t- PA. Ultrasound imaging documentation was saved.  Through the antegrade dilator, a Bentson wire was advanced to the venous anastomosis. Over this a 61F sheath was placed, through which a 5 [place] Kumpe catheter was advanced for outflow venography. This showed patency of the outflow  venous system through the IVC. 3000 units heparin were administered. The Cleaner device was used to macerate thrombus in the graft. Injection showed   clearance of thrombus from the graft. Balloon angioplasty of the   venous anastomosis was performed using a 7 mm x 4 cm Conquest angioplasty balloon with good response.  Balloon was removed and injection showed patency of the anastomosis, without extravasation or other apparent complication. The venous limb dilator was exchanged in similar fashion for a 6 French vascular sheath. The Fogarty catheter was advanced across the arterial anastomosis and used to dislodge the platelet plug into the arterial limb of the graft, where it was macerated. The Fogarty device was used again to remove any residual platelet plug from the arterial anastomosis.  A follow shuntogram was performed, demonstrating good flow through the outflow vein, no extravasation. Reflux across the arterial anastomosis demonstrate this to be widely patent, with unremarkable appearance of the visualized native arterial circulation. The catheter and sheaths were then removed and hemostasis achieved with 2-0 Ethilon sutures. Patient tolerated procedure well.  IMPRESSION  1. Technically successful declot of right thigh loop synthetic hemodialysis graft.  2. Technically successful balloon angioplasty of venous anastomotic stenosis.  Access management: Remains approachable for percutaneous intervention as needed.  Original Report Authenticated By: Osa Craver, M.D.      old records reviewed as above. Post surgical bleeding resolved   MDM  Serial evaluations after pressure dressing applied to right thigh. No active bleeding in the emergency department. Patient  stable for discharge home with followup as scheduled.         Sunnie Nielsen, MD 03/27/11 [phone]

## 2011-03-27 NOTE — H&P (Signed)
[name] is an 55 y.o. male.   Chief Complaint: ESRD; Rt thigh graft reclotted  HPI: declotted 11/29 but pt woke up last night and site was bleeding; went to ER; now graft  Site not bleeding but reclotted.  Past Medical History  Diagnosis Date  . Renal insufficiency   . HTN (hypertension)   . Anxiety   . Back pain   . GERD (gastroesophageal reflux disease)   . Peripheral neuropathy   . Arthritis   . CHF (congestive heart failure)   . Active smoker     Past Surgical History  Procedure Date  . Total hip arthroplasty   . Thyroidectomy   . Tooth extraction   . Mandible fracture surgery   . Dg av dialysis graft declot or     History reviewed. No pertinent family history. Social History:  reports that he has been smoking.  He does not have any smokeless tobacco history on file. He reports that he does not drink alcohol or use illicit drugs.  Allergies: No Known Allergies  Medications Prior to Admission  Medication Sig Dispense Refill  . b complex-vitamin c-folic acid (NEPHRO-VITE) 0.8 MG TABS Take 0.8 mg by mouth at bedtime.       . calcium acetate (PHOSLO) 667 MG capsule Take 2,668 mg by mouth 3 (three) times daily with meals.        . colchicine 0.6 MG tablet Take 0.6 mg by mouth 2 (two) times daily as needed. For gout       . esomeprazole (NEXIUM) 40 MG capsule Take 40 mg by mouth every evening.        . gabapentin (NEURONTIN) 100 MG capsule Take 100 mg by mouth at bedtime.        Marland Kitchen oxyCODONE (OXY IR/ROXICODONE) 5 MG immediate release tablet Take 5 mg by mouth 3 (three) times daily.        . sildenafil (VIAGRA) 100 MG tablet Take 100 mg by mouth daily as needed. For erectile dysfunction        . aspirin 325 MG EC tablet Take 325 mg by mouth daily.        . hydrOXYzine (ATARAX/VISTARIL) 25 MG tablet Take 25 mg by mouth every 8 (eight) hours as needed. For itching       . ibuprofen (ADVIL,MOTRIN) 200 MG tablet Take 200 mg by mouth every 6 (six) hours as needed. For pain        . Sevelamer Carbonate (RENVELA) 2.4 G PACK Take 2.4 g by mouth 3 (three) times daily with meals.        . sodium bicarbonate 650 MG tablet Take 650 mg by mouth 2 (two) times daily.        . traMADol (ULTRAM) 50 MG tablet Take 50 mg by mouth every 8 (eight) hours as needed. For pain;Maximum dose= 8 tablets per day        Medications Prior to Admission  Medication Dose Route Frequency Provider Last Rate Last Dose  . 0.9 %  sodium chloride infusion   Intravenous Continuous PRN Durwin Glaze III 50 mL/hr at 03/26/11 1345 50 mL/hr at 03/26/11 1345  . alteplase (ACTIVASE) injection 2 mg  2 mg Intracatheter Once Dyke Maes, MD   2 mg at 03/26/11 1422  . alteplase (ACTIVASE) injection 2 mg  2 mg Intracatheter Once Casimiro Needle T. Shick      . fentaNYL (SUBLIMAZE) 0.05 MG/ML injection           .  fentaNYL (SUBLIMAZE) injection   Intravenous PRN Durwin Glaze III   50 mcg at 03/26/11 1412  . heparin 1000 UNIT/ML injection        3,000 Units at 03/26/11 1408  . heparin 1000 UNIT/ML injection           . iohexol (OMNIPAQUE) 300 MG/ML injection 100 mL  100 mL Intravenous Once PRN Dayne Oley Balm III   50 mL at 03/26/11 1437  . midazolam (VERSED) 2 MG/2ML injection           . midazolam (VERSED) 5 MG/5ML injection   Intravenous PRN Dayne Oley Balm III   2 mg at 03/26/11 1411  . DISCONTD: alteplase (CATHFLO ACTIVASE) injection 2 mg  2 mg Intracatheter Once Delphi III      . DISCONTD: fentaNYL (SUBLIMAZE) 0.05 MG/ML injection           . DISCONTD: midazolam (VERSED) 2 MG/2ML injection             Results for orders placed during the hospital encounter of 03/26/11 (from the past 48 hour(s))  POTASSIUM     Status: Abnormal   Collection Time   03/26/11 11:30 AM      Component Value Range Comment   Potassium 5.3 (*) 3.5 - 5.1 (mEq/L) HEMOLYSIS AT THIS LEVEL MAY AFFECT RESULT   Ir Pta Venous Right  03/26/2011  *RADIOLOGY REPORT*   Clinical data:  Occluded  dialysis graft after extravasation and hypotension episode.  Previous venous anastomotic PTA 03/12/2011.  DIALYSIS GRAFT DECLOT VENOUS ANGIOPLASTY ULTRASOUND GUIDANCE FOR VASCULAR ACCESS X2  Comparison:  None available  Technique: The procedure, risks (including but not limited to bleeding, infection, organ damage), benefits, and alternatives were explained to the patient.  Questions regarding the procedure were encouraged and answered.  The patient understands and consents to the procedure.  Intravenous Fentanyl and Versed were administered as conscious sedation during continuous cardiorespiratory monitoring by the radiology RN, with a total moderate sedation time of 40 minutes.   The arterial limb of the graft was accessed antegrade with a 21- gauge micropuncture needle under real-time ultrasonic guidance after the overlying skin prepped with Betadine, draped in usual sterile fashion, infiltrated locally with 1% lidocaine. Needle exchanged over 018 guidewire for transitional dilator through which 1 mg t-PA was administered. In similar fashion, the venous limb was accessed retrograde under ultrasound with a micropuncture needle, exchanged for a transitional dilator for  administration of 1 mg t- PA. Ultrasound imaging documentation was saved. Through the antegrade dilator, a Bentson wire was advanced to the venous anastomosis. Over this a 46F sheath was placed, through which a 5 [place] Kumpe catheter was advanced for outflow venography. This showed patency of the outflow  venous system through the IVC. 3000 units heparin were administered. The Cleaner device was used to macerate thrombus in the graft. Injection showed   clearance of thrombus from the graft. Balloon angioplasty of the   venous anastomosis was performed using a 7 mm x 4 cm Conquest angioplasty balloon with good response.  Balloon was removed and injection showed patency of the anastomosis, without extravasation or other apparent complication. The  venous limb dilator was exchanged in similar fashion for a 6 French vascular sheath. The Fogarty catheter was advanced across the arterial anastomosis and used to dislodge the platelet plug into the arterial limb of the graft, where it was macerated. The Fogarty device was used again to remove any residual platelet plug from the arterial  anastomosis.  A follow shuntogram was performed, demonstrating good flow through the outflow vein, no extravasation. Reflux across the arterial anastomosis demonstrate this to be widely patent, with unremarkable appearance of the visualized native arterial circulation. The catheter and sheaths were then removed and hemostasis achieved with 2-0 Ethilon sutures. Patient tolerated procedure well.  IMPRESSION  1. Technically successful declot of right thigh loop synthetic hemodialysis graft.  2. Technically successful balloon angioplasty of venous anastomotic stenosis.  Access management: Remains approachable for percutaneous intervention as needed.  Original Report Authenticated By: Osa Craver, M.D.   Ir Angio Av Shunt Addl Access  03/26/2011  *RADIOLOGY REPORT*   Clinical data:  Occluded dialysis graft after extravasation and hypotension episode.  Previous venous anastomotic PTA 03/12/2011.  DIALYSIS GRAFT DECLOT VENOUS ANGIOPLASTY ULTRASOUND GUIDANCE FOR VASCULAR ACCESS X2  Comparison:  None available  Technique: The procedure, risks (including but not limited to bleeding, infection, organ damage), benefits, and alternatives were explained to the patient.  Questions regarding the procedure were encouraged and answered.  The patient understands and consents to the procedure.  Intravenous Fentanyl and Versed were administered as conscious sedation during continuous cardiorespiratory monitoring by the radiology RN, with a total moderate sedation time of 40 minutes.   The arterial limb of the graft was accessed antegrade with a 21- gauge micropuncture needle under  real-time ultrasonic guidance after the overlying skin prepped with Betadine, draped in usual sterile fashion, infiltrated locally with 1% lidocaine. Needle exchanged over 018 guidewire for transitional dilator through which 1 mg t-PA was administered. In similar fashion, the venous limb was accessed retrograde under ultrasound with a micropuncture needle, exchanged for a transitional dilator for  administration of 1 mg t- PA. Ultrasound imaging documentation was saved. Through the antegrade dilator, a Bentson wire was advanced to the venous anastomosis. Over this a 70F sheath was placed, through which a 5 [place] Kumpe catheter was advanced for outflow venography. This showed patency of the outflow  venous system through the IVC. 3000 units heparin were administered. The Cleaner device was used to macerate thrombus in the graft. Injection showed   clearance of thrombus from the graft. Balloon angioplasty of the   venous anastomosis was performed using a 7 mm x 4 cm Conquest angioplasty balloon with good response.  Balloon was removed and injection showed patency of the anastomosis, without extravasation or other apparent complication. The venous limb dilator was exchanged in similar fashion for a 6 French vascular sheath. The Fogarty catheter was advanced across the arterial anastomosis and used to dislodge the platelet plug into the arterial limb of the graft, where it was macerated. The Fogarty device was used again to remove any residual platelet plug from the arterial anastomosis.  A follow shuntogram was performed, demonstrating good flow through the outflow vein, no extravasation. Reflux across the arterial anastomosis demonstrate this to be widely patent, with unremarkable appearance of the visualized native arterial circulation. The catheter and sheaths were then removed and hemostasis achieved with 2-0 Ethilon sutures. Patient tolerated procedure well.  IMPRESSION  1. Technically successful declot of right  thigh loop synthetic hemodialysis graft.  2. Technically successful balloon angioplasty of venous anastomotic stenosis.  Access management: Remains approachable for percutaneous intervention as needed.  Original Report Authenticated By: Osa Craver, M.D.   Ir US Guide Vasc Access Right  03/26/2011  *RADIOLOGY REPORT*   Clinical data:  Occluded dialysis graft after extravasation and hypotension episode.  Previous venous anastomotic PTA 03/12/2011.  DIALYSIS GRAFT DECLOT VENOUS ANGIOPLASTY ULTRASOUND GUIDANCE FOR VASCULAR ACCESS X2  Comparison:  None available  Technique: The procedure, risks (including but not limited to bleeding, infection, organ damage), benefits, and alternatives were explained to the patient.  Questions regarding the procedure were encouraged and answered.  The patient understands and consents to the procedure.  Intravenous Fentanyl and Versed were administered as conscious sedation during continuous cardiorespiratory monitoring by the radiology RN, with a total moderate sedation time of 40 minutes.   The arterial limb of the graft was accessed antegrade with a 21- gauge micropuncture needle under real-time ultrasonic guidance after the overlying skin prepped with Betadine, draped in usual sterile fashion, infiltrated locally with 1% lidocaine. Needle exchanged over 018 guidewire for transitional dilator through which 1 mg t-PA was administered. In similar fashion, the venous limb was accessed retrograde under ultrasound with a micropuncture needle, exchanged for a transitional dilator for  administration of 1 mg t- PA. Ultrasound imaging documentation was saved. Through the antegrade dilator, a Bentson wire was advanced to the venous anastomosis. Over this a 70F sheath was placed, through which a 5 [place] Kumpe catheter was advanced for outflow venography. This showed patency of the outflow  venous system through the IVC. 3000 units heparin were administered. The Cleaner device was  used to macerate thrombus in the graft. Injection showed   clearance of thrombus from the graft. Balloon angioplasty of the   venous anastomosis was performed using a 7 mm x 4 cm Conquest angioplasty balloon with good response.  Balloon was removed and injection showed patency of the anastomosis, without extravasation or other apparent complication. The venous limb dilator was exchanged in similar fashion for a 6 French vascular sheath. The Fogarty catheter was advanced across the arterial anastomosis and used to dislodge the platelet plug into the arterial limb of the graft, where it was macerated. The Fogarty device was used again to remove any residual platelet plug from the arterial anastomosis.  A follow shuntogram was performed, demonstrating good flow through the outflow vein, no extravasation. Reflux across the arterial anastomosis demonstrate this to be widely patent, with unremarkable appearance of the visualized native arterial circulation. The catheter and sheaths were then removed and hemostasis achieved with 2-0 Ethilon sutures. Patient tolerated procedure well.  IMPRESSION  1. Technically successful declot of right thigh loop synthetic hemodialysis graft.  2. Technically successful balloon angioplasty of venous anastomotic stenosis.  Access management: Remains approachable for percutaneous intervention as needed.  Original Report Authenticated By: Osa Craver, M.D.   Ir Declot Right  03/26/2011  *RADIOLOGY REPORT*   Clinical data:  Occluded dialysis graft after extravasation and hypotension episode.  Previous venous anastomotic PTA 03/12/2011.  DIALYSIS GRAFT DECLOT VENOUS ANGIOPLASTY ULTRASOUND GUIDANCE FOR VASCULAR ACCESS X2  Comparison:  None available  Technique: The procedure, risks (including but not limited to bleeding, infection, organ damage), benefits, and alternatives were explained to the patient.  Questions regarding the procedure were encouraged and answered.  The patient  understands and consents to the procedure.  Intravenous Fentanyl and Versed were administered as conscious sedation during continuous cardiorespiratory monitoring by the radiology RN, with a total moderate sedation time of 40 minutes.   The arterial limb of the graft was accessed antegrade with a 21- gauge micropuncture needle under real-time ultrasonic guidance after the overlying skin prepped with Betadine, draped in usual sterile fashion, infiltrated locally with 1% lidocaine. Needle exchanged over 018 guidewire for transitional dilator through which 1 mg t-PA  was administered. In similar fashion, the venous limb was accessed retrograde under ultrasound with a micropuncture needle, exchanged for a transitional dilator for  administration of 1 mg t- PA. Ultrasound imaging documentation was saved. Through the antegrade dilator, a Bentson wire was advanced to the venous anastomosis. Over this a 35F sheath was placed, through which a 5 [place] Kumpe catheter was advanced for outflow venography. This showed patency of the outflow  venous system through the IVC. 3000 units heparin were administered. The Cleaner device was used to macerate thrombus in the graft. Injection showed   clearance of thrombus from the graft. Balloon angioplasty of the   venous anastomosis was performed using a 7 mm x 4 cm Conquest angioplasty balloon with good response.  Balloon was removed and injection showed patency of the anastomosis, without extravasation or other apparent complication. The venous limb dilator was exchanged in similar fashion for a 6 French vascular sheath. The Fogarty catheter was advanced across the arterial anastomosis and used to dislodge the platelet plug into the arterial limb of the graft, where it was macerated. The Fogarty device was used again to remove any residual platelet plug from the arterial anastomosis.  A follow shuntogram was performed, demonstrating good flow through the outflow vein, no extravasation.  Reflux across the arterial anastomosis demonstrate this to be widely patent, with unremarkable appearance of the visualized native arterial circulation. The catheter and sheaths were then removed and hemostasis achieved with 2-0 Ethilon sutures. Patient tolerated procedure well.  IMPRESSION  1. Technically successful declot of right thigh loop synthetic hemodialysis graft.  2. Technically successful balloon angioplasty of venous anastomotic stenosis.  Access management: Remains approachable for percutaneous intervention as needed.  Original Report Authenticated By: Osa Craver, M.D.    Review of Systems  Constitutional: Negative for fever.  Eyes: Negative for blurred vision.  Respiratory: Negative for cough.   Cardiovascular: Negative for chest pain.  Gastrointestinal: Negative for nausea, vomiting, abdominal pain and diarrhea.  Musculoskeletal: Positive for back pain and joint pain.       Carpal tunnell bilat  Neurological: Negative for dizziness and headaches.    There were no vitals taken for this visit. Physical Exam  Constitutional: He is oriented to person, place, and time. He appears well-developed and well-nourished.  HENT:  Head: Normocephalic.  Eyes: EOM are normal.  Neck: Normal range of motion. Neck supple.  Cardiovascular: Normal rate, regular rhythm and normal heart sounds.   No murmur heard.      distant heart sounds  Respiratory: Effort normal and breath sounds normal.  GI: Soft. Bowel sounds are normal. He exhibits no mass. There is no tenderness.  Musculoskeletal: Normal range of motion.  Neurological: He is alert and oriented to person, place, and time.  Skin: Skin is warm and dry.     Assessment/Plan Pt scheduled for Rt thigh graft thrombolysis and possible angioplasty/stent.   Possible dialysis catheter placement if necessary. Understands procedure benefits and risks and agreeable to proceed. Consent signed.  Kyjuan Gause A 03/27/2011, 10:46  AM

## 2011-03-27 NOTE — Procedures (Signed)
Successful repeat RT FEM AVG declot with 7mm PTA and Angiojet No comp Stable Ready to use

## 2011-03-27 NOTE — ED Notes (Signed)
To nurses station. No complaints of pain. Dressing right thigh x2 is dry and intact. Thrill felt over fistula. VS stable. Report given to Maralyn Sago on Short Stay.

## 2011-03-28 ENCOUNTER — Ambulatory Visit (HOSPITAL_COMMUNITY): Payer: Medicare Other

## 2011-03-28 ENCOUNTER — Encounter (HOSPITAL_COMMUNITY): Payer: Self-pay | Admitting: *Deleted

## 2011-03-28 ENCOUNTER — Ambulatory Visit (HOSPITAL_COMMUNITY)
Admission: RE | Admit: 2011-03-28 | Discharge: 2011-03-28 | Disposition: A | Discharge status: Home / Self Care | Payer: Medicare Other | Source: Ambulatory Visit | Attending: Vascular Surgery | Admitting: Vascular Surgery

## 2011-03-28 ENCOUNTER — Encounter (HOSPITAL_COMMUNITY)
Admission: RE | Disposition: A | Discharge status: Home / Self Care | Payer: Self-pay | Source: Ambulatory Visit | Attending: Vascular Surgery

## 2011-03-28 ENCOUNTER — Encounter (HOSPITAL_COMMUNITY): Payer: Self-pay | Admitting: Certified Registered"

## 2011-03-28 ENCOUNTER — Ambulatory Visit (HOSPITAL_COMMUNITY): Payer: Medicare Other | Admitting: Certified Registered"

## 2011-03-28 DIAGNOSIS — F17200 Nicotine dependence, unspecified, uncomplicated: Secondary | ICD-10-CM | POA: Insufficient documentation

## 2011-03-28 DIAGNOSIS — K219 Gastro-esophageal reflux disease without esophagitis: Secondary | ICD-10-CM | POA: Insufficient documentation

## 2011-03-28 DIAGNOSIS — F411 Generalized anxiety disorder: Secondary | ICD-10-CM | POA: Insufficient documentation

## 2011-03-28 DIAGNOSIS — N186 End stage renal disease: Secondary | ICD-10-CM | POA: Insufficient documentation

## 2011-03-28 DIAGNOSIS — Z01818 Encounter for other preprocedural examination: Secondary | ICD-10-CM | POA: Insufficient documentation

## 2011-03-28 DIAGNOSIS — I120 Hypertensive chronic kidney disease with stage 5 chronic kidney disease or end stage renal disease: Secondary | ICD-10-CM | POA: Insufficient documentation

## 2011-03-28 DIAGNOSIS — I509 Heart failure, unspecified: Secondary | ICD-10-CM | POA: Insufficient documentation

## 2011-03-28 DIAGNOSIS — Z01812 Encounter for preprocedural laboratory examination: Secondary | ICD-10-CM | POA: Insufficient documentation

## 2011-03-28 DIAGNOSIS — K08109 Complete loss of teeth, unspecified cause, unspecified class: Secondary | ICD-10-CM | POA: Insufficient documentation

## 2011-03-28 DIAGNOSIS — E119 Type 2 diabetes mellitus without complications: Secondary | ICD-10-CM | POA: Insufficient documentation

## 2011-03-28 DIAGNOSIS — Z01810 Encounter for preprocedural cardiovascular examination: Secondary | ICD-10-CM | POA: Insufficient documentation

## 2011-03-28 HISTORY — PX: INSERTION OF DIALYSIS CATHETER: SHX1324

## 2011-03-28 LAB — GLUCOSE, CAPILLARY: Glucose-Capillary: 108 mg/dL — ABNORMAL HIGH (ref 70–99)

## 2011-03-28 LAB — POCT I-STAT 4, (NA,K, GLUC, HGB,HCT): Glucose, Bld: 106 mg/dL — ABNORMAL HIGH (ref 70–99)

## 2011-03-28 SURGERY — INSERTION OF DIALYSIS CATHETER
Anesthesia: General | Site: Neck | Laterality: Right | Wound class: Clean

## 2011-03-28 MED ORDER — MIDAZOLAM HCL 5 MG/5ML IJ SOLN
INTRAMUSCULAR | Status: DC | PRN
  Administered 2011-03-28: 2 mg via INTRAVENOUS

## 2011-03-28 MED ORDER — SODIUM CHLORIDE 0.9 % IR SOLN
Status: DC | PRN
  Administered 2011-03-28: 08:00:00

## 2011-03-28 MED ORDER — HEPARIN SODIUM (PORCINE) 1000 UNIT/ML IJ SOLN
INTRAMUSCULAR | Status: DC | PRN
  Administered 2011-03-28: 4.6 mL

## 2011-03-28 MED ORDER — PROPOFOL 10 MG/ML IV EMUL
INTRAVENOUS | Status: DC | PRN
  Administered 2011-03-28: 140 ug/kg/min via INTRAVENOUS

## 2011-03-28 MED ORDER — DROPERIDOL 2.5 MG/ML IJ SOLN: 0.6250 mg | INTRAMUSCULAR | Status: DC | PRN

## 2011-03-28 MED ORDER — LIDOCAINE-EPINEPHRINE (PF) 1 %-1:200000 IJ SOLN
INTRAMUSCULAR | Status: DC | PRN
  Administered 2011-03-28: 25 mL

## 2011-03-28 MED ORDER — FENTANYL CITRATE 0.05 MG/ML IJ SOLN
INTRAMUSCULAR | Status: DC | PRN
  Administered 2011-03-28 (×5): 50 ug via INTRAVENOUS

## 2011-03-28 MED ORDER — OXYCODONE-ACETAMINOPHEN 5-500 MG PO CAPS
1.0000 | ORAL_CAPSULE | ORAL | Status: DC | PRN
Start: 2011-03-28 — End: 2011-03-31

## 2011-03-28 MED ORDER — SODIUM CHLORIDE 0.9 % IV SOLN
INTRAVENOUS | Status: DC | PRN
  Administered 2011-03-28: 07:00:00 via INTRAVENOUS

## 2011-03-28 MED ORDER — HYDROMORPHONE HCL PF 1 MG/ML IJ SOLN: 0.2500 mg | INTRAMUSCULAR | Status: DC | PRN

## 2011-03-28 SURGICAL SUPPLY — 54 items
BAG DECANTER FOR FLEXI CONT (MISCELLANEOUS) ×3 IMPLANT
CANISTER SUCTION 2500CC (MISCELLANEOUS) IMPLANT
CATH CANNON HEMO 15F 50CM (CATHETERS) IMPLANT
CATH CANNON HEMO 15FR 19 (HEMODIALYSIS SUPPLIES) IMPLANT
CATH CANNON HEMO 15FR 23CM (HEMODIALYSIS SUPPLIES) ×3 IMPLANT
CATH CANNON HEMO 15FR 31CM (HEMODIALYSIS SUPPLIES) IMPLANT
CATH CANNON HEMO 15FR 32CM (HEMODIALYSIS SUPPLIES) IMPLANT
CATH EMB 5FR 80CM (CATHETERS) IMPLANT
CLIP TI MEDIUM 6 (CLIP) ×3 IMPLANT
CLIP TI WIDE RED SMALL 6 (CLIP) ×3 IMPLANT
CLOTH BEACON ORANGE TIMEOUT ST (SAFETY) ×3 IMPLANT
COVER PROBE W GEL 5X96 (DRAPES) IMPLANT
COVER SURGICAL LIGHT HANDLE (MISCELLANEOUS) ×6 IMPLANT
DECANTER SPIKE VIAL GLASS SM (MISCELLANEOUS) ×3 IMPLANT
DERMABOND ADVANCED (GAUZE/BANDAGES/DRESSINGS) ×1
DERMABOND ADVANCED .7 DNX12 (GAUZE/BANDAGES/DRESSINGS) ×2 IMPLANT
DRAPE C-ARM 42X72 X-RAY (DRAPES) ×3 IMPLANT
DRAPE CHEST BREAST 15X10 FENES (DRAPES) ×3 IMPLANT
DRSG OPSITE 4X5.5 SM (GAUZE/BANDAGES/DRESSINGS) ×3 IMPLANT
ELECT REM PT RETURN 9FT ADLT (ELECTROSURGICAL)
ELECTRODE REM PT RTRN 9FT ADLT (ELECTROSURGICAL) IMPLANT
GAUZE SPONGE 2X2 8PLY STRL LF (GAUZE/BANDAGES/DRESSINGS) ×2 IMPLANT
GAUZE SPONGE 4X4 16PLY XRAY LF (GAUZE/BANDAGES/DRESSINGS) IMPLANT
GEL ULTRASOUND 20GR AQUASONIC (MISCELLANEOUS) IMPLANT
GLOVE SS BIOGEL STRL SZ 7 (GLOVE) ×2 IMPLANT
GLOVE SUPERSENSE BIOGEL SZ 7 (GLOVE) ×1
GOWN STRL NON-REIN LRG LVL3 (GOWN DISPOSABLE) ×6 IMPLANT
KIT BASIN OR (CUSTOM PROCEDURE TRAY) ×3 IMPLANT
KIT ROOM TURNOVER OR (KITS) ×3 IMPLANT
NEEDLE 18GX1X1/2 (RX/OR ONLY) (NEEDLE) ×3 IMPLANT
NEEDLE 22X1 1/2 (OR ONLY) (NEEDLE) ×3 IMPLANT
NEEDLE HYPO 25GX1X1/2 BEV (NEEDLE) ×3 IMPLANT
NS IRRIG 1000ML POUR BTL (IV SOLUTION) ×3 IMPLANT
PACK CV ACCESS (CUSTOM PROCEDURE TRAY) ×3 IMPLANT
PACK SURGICAL SETUP 50X90 (CUSTOM PROCEDURE TRAY) IMPLANT
PAD ARMBOARD 7.5X6 YLW CONV (MISCELLANEOUS) ×6 IMPLANT
SOAP 2 % CHG 4 OZ (WOUND CARE) ×3 IMPLANT
SPONGE GAUZE 2X2 STER 10/PKG (GAUZE/BANDAGES/DRESSINGS) ×1
SPONGE GAUZE 4X4 12PLY (GAUZE/BANDAGES/DRESSINGS) ×3 IMPLANT
SUT ETHILON 3 0 PS 1 (SUTURE) ×3 IMPLANT
SUT PROLENE 6 0 BV (SUTURE) IMPLANT
SUT VIC AB 3-0 SH 27 (SUTURE) ×1
SUT VIC AB 3-0 SH 27X BRD (SUTURE) ×2 IMPLANT
SUT VICRYL 4-0 PS2 18IN ABS (SUTURE) ×3 IMPLANT
SYR 20CC LL (SYRINGE) ×3 IMPLANT
SYR 30ML LL (SYRINGE) IMPLANT
SYR 5ML LL (SYRINGE) ×6 IMPLANT
SYR CONTROL 10ML LL (SYRINGE) ×3 IMPLANT
SYRINGE 10CC LL (SYRINGE) ×3 IMPLANT
TOWEL OR 17X24 6PK STRL BLUE (TOWEL DISPOSABLE) ×3 IMPLANT
TOWEL OR 17X26 10 PK STRL BLUE (TOWEL DISPOSABLE) ×3 IMPLANT
UNDERPAD 30X30 INCONTINENT (UNDERPADS AND DIAPERS) IMPLANT
WATER STERILE IRR 1000ML POUR (IV SOLUTION) IMPLANT
WIRE AMPLATZ SS-J .035X180CM (WIRE) ×3 IMPLANT

## 2011-03-28 NOTE — Transfer of Care (Signed)
Immediate Anesthesia Transfer of Care Note  Patient: [name]  Procedure(s) Performed:  INSERTION OF DIALYSIS CATHETER  Patient Location: PACU  Anesthesia Type: MAC  Level of Consciousness: awake, alert  and oriented  Airway & Oxygen Therapy: Patient Spontanous Breathing  Post-op Assessment: Report given to PACU RN, Post -op Vital signs reviewed and stable and Patient moving all extremities X 4  Post vital signs: Reviewed and stable  Complications: No apparent anesthesia complications

## 2011-03-28 NOTE — Op Note (Signed)
OPERATIVE REPORT  Date of Surgery: 03/28/2011  Surgeon: Josephina Gip, MD  Assistant: none  Pre-op Diagnosis     ESRD Post-op Diagnosis: same Procedure: Procedure(s): INSERTION OF DIALYSIS CATHETER via left internal jugular vein (27 cm) Anesthesia: General  EBL: 50 cc  Complications: None  Procedure Details: Patient was taken to the operating room placed in the Trendelenburg position at which time the upper chest and neck was exposed. Using the SonoSite ultrasound device both internal jugular veins were imaged. The right was noted to be occluded. The left was widely patent. After prepping and draping in the routine sterile manner the left internal jugular vein was entered using a supraclavicular approach after infiltration with 1% Xylocaine. A guidewire was passed into the right atrium under fluoroscopic guidance. After dilating the tract appropriately the guidewire was exchanged for an Amplatz superstiff wire. The peel-away sheath was then placed over the Amplatz wire and a 27 cm hemodialysis catheter passed over the wire into the right atrium under fluoroscopic guidance. The sheath was then removed after tunneling the catheter appropriately the wound was closed with Vicryl in subcuticular fashion. Sterile dressing applied patient taken to the recovery room for a portable chest x-ray  Josephina Gip, MD 03/28/2011 8:48 AM

## 2011-03-28 NOTE — Preoperative (Signed)
Beta Blockers   Reason not to administer Beta Blockers:Pt does not take B-blockers 

## 2011-03-28 NOTE — Consult Note (Signed)
Vascular Surgery Consultation  Reason for Consult: Clotted thigh graft HPI: [name] is a 55 y.o. male who presents for evaluation of recurrent thrombosis of right thigh graft. This patient has had a right thigh graft in place for about 7 years which has had surgical revisions on 2 occasions. He has had multiple interventions by interventional radiology over the past several months to in the past 2 days which have resulted in recurrent thrombosis. He was sent for insertion of a hemodialysis catheter  Past Medical History  Diagnosis Date  . Renal insufficiency   . HTN (hypertension)   . Anxiety   . Back pain   . GERD (gastroesophageal reflux disease)   . Peripheral neuropathy   . Arthritis   . CHF (congestive heart failure)   . Active smoker   . Dialysis patient     M-W-F   Past Surgical History  Procedure Date  . Total hip arthroplasty   . Thyroidectomy   . Tooth extraction   . Mandible fracture surgery   . Dg av dialysis graft declot or    History   Social History  . Marital Status: Single    Spouse Name: N/A    Number of Children: N/A  . Years of Education: N/A   Social History Main Topics  . Smoking status: Current Everyday Smoker -- 1.0 packs/day for 35 years    Types: Cigarettes  . Smokeless tobacco: None  . Alcohol Use: No  . Drug Use: No  . Sexually Active: None   Other Topics Concern  . None   Social History Narrative  . None   History reviewed. No pertinent family history. No Known Allergies Prior to Admission medications   Medication Sig Start Date End Date Taking? Authorizing Provider  aspirin 325 MG EC tablet Take 325 mg by mouth daily.     Yes Historical Provider, MD  b complex-vitamin c-folic acid (NEPHRO-VITE) 0.8 MG TABS Take 0.8 mg by mouth at bedtime.    Yes Historical Provider, MD  colchicine 0.6 MG tablet Take 0.6 mg by mouth 2 (two) times daily as needed. For gout    Yes Historical Provider, MD  esomeprazole (NEXIUM) 40 MG capsule  Take 40 mg by mouth every evening.     Yes Historical Provider, MD  hydrOXYzine (ATARAX/VISTARIL) 25 MG tablet Take 25 mg by mouth every 8 (eight) hours as needed. For itching    Yes Historical Provider, MD  ibuprofen (ADVIL,MOTRIN) 200 MG tablet Take 200 mg by mouth every 6 (six) hours as needed. For pain    Yes Historical Provider, MD  oxyCODONE (OXY IR/ROXICODONE) 5 MG immediate release tablet Take 5 mg by mouth 3 (three) times daily.     Yes Historical Provider, MD  Sevelamer Carbonate (RENVELA) 2.4 G PACK Take 2.4 g by mouth 3 (three) times daily with meals.     Yes Historical Provider, MD  sildenafil (VIAGRA) 100 MG tablet Take 100 mg by mouth daily as needed. For erectile dysfunction     Yes Historical Provider, MD  traMADol (ULTRAM) 50 MG tablet Take 50 mg by mouth every 8 (eight) hours as needed. For pain;Maximum dose= 8 tablets per day    Yes Historical Provider, MD  calcium acetate (PHOSLO) 667 MG capsule Take 2,668 mg by mouth 3 (three) times daily with meals.      Historical Provider, MD  gabapentin (NEURONTIN) 100 MG capsule Take 100 mg by mouth at bedtime.      Historical Provider,   MD  sodium bicarbonate 650 MG tablet Take 650 mg by mouth 2 (two) times daily.      Historical Provider, MD     Positive ROS: Denies chest pain dyspnea on exertion PND orthopnea or hemoptysis All other systems have been reviewed and were otherwise negative with the exception of those mentioned in the HPI and as above.  Physical Exam: Filed Vitals:   03/28/11 0641  BP: 146/75  Pulse: 71  Temp: 97.6 F (36.4 C)  Resp: 16    General: Alert, no acute distress HEENT: Normal for age Cardiovascular: Regular rate and rhythm. Carotid pulses 2+, no bruits audible Respiratory: Clear to auscultation. No cyanosis, no use of accessory musculature GI: No organomegaly, abdomen is soft and non-tender Skin: No lesions in the area of chief complaint Neurologic: Sensation intact distally Psychiatric: Patient  is competent for consent with normal mood and affect Musculoskeletal: No obvious deformities Extremities: Right lower extremity reveals a thrombosed right thigh graft in a loop configuration. 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 We'll consider insertion of a new right thigh graft around the existing graft in the near future.  James Lawson, MD 03/28/2011 7:46 AM  Hemoptysis hemoptysis  

## 2011-03-28 NOTE — Anesthesia Postprocedure Evaluation (Signed)
Anesthesia Post Note  Patient: [name]  Procedure(s) Performed:  INSERTION OF DIALYSIS CATHETER  Anesthesia type: MAC  Patient location: PACU  Post pain: Pain level controlled  Post assessment: Patient's Cardiovascular Status Stable  Last Vitals:  Filed Vitals:   03/28/11 0915  BP:   Pulse:   Temp: 36.1 C  Resp:     Post vital signs: Reviewed and stable  Level of consciousness: sedated  Complications: No apparent anesthesia complications

## 2011-03-28 NOTE — Anesthesia Procedure Notes (Addendum)
Procedure Name: MAC Date/Time: 03/28/2011 8:00 AM Performed by: Rossie Muskrat Pre-anesthesia Checklist: Patient identified, Timeout performed, Emergency Drugs available, Suction available and Patient being monitored Patient Re-evaluated:Patient Re-evaluated prior to inductionOxygen Delivery Method: Simple face mask Intubation Type: IV induction Ventilation: Nasal airway inserted- appropriate to patient size Dental Injury: Teeth and Oropharynx as per pre-operative assessment

## 2011-03-28 NOTE — Anesthesia Preprocedure Evaluation (Addendum)
Anesthesia Evaluation  Patient identified by MRN, date of birth, ID band Patient awake    Reviewed: Allergy & Precautions, H&P , NPO status , Patient's Chart, lab work & pertinent test results  History of Anesthesia Complications Negative for: history of anesthetic complications  Airway Mallampati: III TM Distance: >3 FB Neck ROM: Full  Mouth opening: Limited Mouth Opening  Dental  (+) Edentulous Upper, Edentulous Lower and Dental Advisory Given   Pulmonary Current Smoker,  clear to auscultation        Cardiovascular hypertension, +CHF Regular Normal+ Systolic murmurs    Neuro/Psych PSYCHIATRIC DISORDERS Anxiety  Neuromuscular disease    GI/Hepatic Neg liver ROS, GERD-  ,  Endo/Other  Diabetes mellitus-Morbid obesity  Renal/GU ESRFRenal disease     Musculoskeletal   Abdominal   Peds  Hematology   Anesthesia Other Findings   Reproductive/Obstetrics                          Anesthesia Physical Anesthesia Plan  ASA: III  Anesthesia Plan: MAC and General   Post-op Pain Management:    Induction: Intravenous  Airway Management Planned: LMA and Simple Face Mask  Additional Equipment:   Intra-op Plan:   Post-operative Plan: Extubation in OR  Informed Consent: I have reviewed the patients History and Physical, chart, labs and discussed the procedure including the risks, benefits and alternatives for the proposed anesthesia with the patient or authorized representative who has indicated his/her understanding and acceptance.   Dental advisory given  Plan Discussed with: CRNA and Surgeon  Anesthesia Plan Comments:         Anesthesia Quick Evaluation

## 2011-03-30 ENCOUNTER — Encounter (HOSPITAL_COMMUNITY): Payer: Self-pay | Admitting: Pharmacy Technician

## 2011-03-30 ENCOUNTER — Encounter (HOSPITAL_COMMUNITY): Payer: Self-pay | Admitting: *Deleted

## 2011-03-30 ENCOUNTER — Other Ambulatory Visit: Payer: Self-pay

## 2011-03-30 MED ORDER — DEXTROSE 5 % IV SOLN
1.5000 g | INTRAVENOUS | Status: AC
  Administered 2011-03-31: 1.5 g via INTRAVENOUS
  Filled 2011-03-30: qty 1.5

## 2011-03-31 ENCOUNTER — Encounter (HOSPITAL_COMMUNITY): Payer: Self-pay | Admitting: *Deleted

## 2011-03-31 ENCOUNTER — Inpatient Hospital Stay (HOSPITAL_COMMUNITY): Payer: Medicare Other | Admitting: Anesthesiology

## 2011-03-31 ENCOUNTER — Encounter (HOSPITAL_COMMUNITY): Payer: Self-pay | Admitting: Anesthesiology

## 2011-03-31 ENCOUNTER — Encounter (HOSPITAL_COMMUNITY)
Admission: RE | Disposition: A | Discharge status: Home / Self Care | Payer: Self-pay | Source: Ambulatory Visit | Attending: Vascular Surgery

## 2011-03-31 ENCOUNTER — Ambulatory Visit (HOSPITAL_COMMUNITY)
Admission: RE | Admit: 2011-03-31 | Discharge: 2011-03-31 | DRG: 264 | Disposition: A | Discharge status: Home / Self Care | Payer: Medicare Other | Source: Ambulatory Visit | Attending: Vascular Surgery | Admitting: Vascular Surgery

## 2011-03-31 DIAGNOSIS — F17200 Nicotine dependence, unspecified, uncomplicated: Secondary | ICD-10-CM | POA: Diagnosis present

## 2011-03-31 DIAGNOSIS — Z7982 Long term (current) use of aspirin: Secondary | ICD-10-CM

## 2011-03-31 DIAGNOSIS — M129 Arthropathy, unspecified: Secondary | ICD-10-CM | POA: Diagnosis present

## 2011-03-31 DIAGNOSIS — I509 Heart failure, unspecified: Secondary | ICD-10-CM | POA: Diagnosis present

## 2011-03-31 DIAGNOSIS — F411 Generalized anxiety disorder: Secondary | ICD-10-CM | POA: Diagnosis present

## 2011-03-31 DIAGNOSIS — K219 Gastro-esophageal reflux disease without esophagitis: Secondary | ICD-10-CM | POA: Diagnosis present

## 2011-03-31 DIAGNOSIS — Y832 Surgical operation with anastomosis, bypass or graft as the cause of abnormal reaction of the patient, or of later complication, without mention of misadventure at the time of the procedure: Secondary | ICD-10-CM | POA: Diagnosis present

## 2011-03-31 DIAGNOSIS — I120 Hypertensive chronic kidney disease with stage 5 chronic kidney disease or end stage renal disease: Secondary | ICD-10-CM | POA: Diagnosis present

## 2011-03-31 DIAGNOSIS — T82898A Other specified complication of vascular prosthetic devices, implants and grafts, initial encounter: Secondary | ICD-10-CM | POA: Diagnosis present

## 2011-03-31 DIAGNOSIS — N186 End stage renal disease: Secondary | ICD-10-CM

## 2011-03-31 DIAGNOSIS — Z96649 Presence of unspecified artificial hip joint: Secondary | ICD-10-CM

## 2011-03-31 DIAGNOSIS — Z992 Dependence on renal dialysis: Secondary | ICD-10-CM

## 2011-03-31 DIAGNOSIS — Z79899 Other long term (current) drug therapy: Secondary | ICD-10-CM

## 2011-03-31 DIAGNOSIS — G609 Hereditary and idiopathic neuropathy, unspecified: Secondary | ICD-10-CM | POA: Diagnosis present

## 2011-03-31 HISTORY — PX: AV FISTULA PLACEMENT: SHX1204

## 2011-03-31 LAB — SURGICAL PCR SCREEN
MRSA, PCR: NEGATIVE
Staphylococcus aureus: NEGATIVE

## 2011-03-31 LAB — POCT I-STAT 4, (NA,K, GLUC, HGB,HCT)
Potassium: 4.7 mEq/L (ref 3.5–5.1)
Sodium: 136 mEq/L (ref 135–145)

## 2011-03-31 SURGERY — INSERTION OF ARTERIOVENOUS (AV) GORE-TEX GRAFT THIGH
Anesthesia: General | Site: Leg Upper | Laterality: Right | Wound class: Clean

## 2011-03-31 MED ORDER — FENTANYL CITRATE 0.05 MG/ML IJ SOLN
INTRAMUSCULAR | Status: DC | PRN
  Administered 2011-03-31: 100 ug via INTRAVENOUS
  Administered 2011-03-31: 250 ug via INTRAVENOUS

## 2011-03-31 MED ORDER — CEFUROXIME SODIUM 1.5 G IJ SOLR
INTRAMUSCULAR | Status: AC
  Filled 2011-03-31: qty 1.5

## 2011-03-31 MED ORDER — HETASTARCH-ELECTROLYTES 6 % IV SOLN
INTRAVENOUS | Status: DC | PRN
  Administered 2011-03-31: 14:00:00 via INTRAVENOUS

## 2011-03-31 MED ORDER — MIDAZOLAM HCL 5 MG/5ML IJ SOLN
INTRAMUSCULAR | Status: DC | PRN
  Administered 2011-03-31: 1 mg via INTRAVENOUS

## 2011-03-31 MED ORDER — SODIUM CHLORIDE 0.9 % IR SOLN
Status: DC | PRN
  Administered 2011-03-31: 14:00:00

## 2011-03-31 MED ORDER — NEOSTIGMINE METHYLSULFATE 1 MG/ML IJ SOLN
INTRAMUSCULAR | Status: DC | PRN
  Administered 2011-03-31: 5 mg via INTRAVENOUS

## 2011-03-31 MED ORDER — SODIUM CHLORIDE 0.9 % IR SOLN
Status: DC | PRN
  Administered 2011-03-31: 1000 mL

## 2011-03-31 MED ORDER — PROPOFOL 10 MG/ML IV EMUL
INTRAVENOUS | Status: DC | PRN
  Administered 2011-03-31: 30 mg via INTRAVENOUS
  Administered 2011-03-31: 200 mg via INTRAVENOUS

## 2011-03-31 MED ORDER — LACTATED RINGERS IV SOLN: INTRAVENOUS | Status: DC

## 2011-03-31 MED ORDER — MUPIROCIN 2 % EX OINT
TOPICAL_OINTMENT | CUTANEOUS | Status: AC
  Administered 2011-03-31: 1 via NASAL
  Filled 2011-03-31: qty 22

## 2011-03-31 MED ORDER — PROMETHAZINE HCL 25 MG/ML IJ SOLN: 6.2500 mg | INTRAMUSCULAR | Status: DC | PRN

## 2011-03-31 MED ORDER — HEPARIN SODIUM (PORCINE) 1000 UNIT/ML IJ SOLN
INTRAMUSCULAR | Status: DC | PRN
  Administered 2011-03-31: 10000 [IU] via INTRAVENOUS

## 2011-03-31 MED ORDER — PROTAMINE SULFATE 10 MG/ML IV SOLN
INTRAVENOUS | Status: DC | PRN
  Administered 2011-03-31: 100 mg via INTRAVENOUS

## 2011-03-31 MED ORDER — SODIUM CHLORIDE 0.9 % IV SOLN
INTRAVENOUS | Status: DC
  Administered 2011-03-31 (×3): via INTRAVENOUS

## 2011-03-31 MED ORDER — PHENYLEPHRINE HCL 10 MG/ML IJ SOLN
10.0000 mg | INTRAVENOUS | Status: DC | PRN
  Administered 2011-03-31: 20 ug/min via INTRAVENOUS

## 2011-03-31 MED ORDER — HYDROMORPHONE HCL PF 1 MG/ML IJ SOLN: 0.2500 mg | INTRAMUSCULAR | Status: DC | PRN

## 2011-03-31 MED ORDER — MORPHINE SULFATE 2 MG/ML IJ SOLN: 0.0500 mg/kg | INTRAMUSCULAR | Status: DC | PRN

## 2011-03-31 MED ORDER — GLYCOPYRROLATE 0.2 MG/ML IJ SOLN
INTRAMUSCULAR | Status: DC | PRN
  Administered 2011-03-31: .8 mg via INTRAVENOUS

## 2011-03-31 MED ORDER — DEXTROSE 5 % IV SOLN
INTRAVENOUS | Status: AC
  Filled 2011-03-31: qty 50

## 2011-03-31 MED ORDER — ROCURONIUM BROMIDE 100 MG/10ML IV SOLN
INTRAVENOUS | Status: DC | PRN
  Administered 2011-03-31: 40 mg via INTRAVENOUS

## 2011-03-31 MED ORDER — SUCCINYLCHOLINE CHLORIDE 20 MG/ML IJ SOLN
INTRAMUSCULAR | Status: DC | PRN
  Administered 2011-03-31: 130 mg via INTRAVENOUS

## 2011-03-31 MED ORDER — ONDANSETRON HCL 4 MG/2ML IJ SOLN
INTRAMUSCULAR | Status: DC | PRN
  Administered 2011-03-31: 4 mg via INTRAVENOUS

## 2011-03-31 MED ORDER — MEPERIDINE HCL 25 MG/ML IJ SOLN: 6.2500 mg | INTRAMUSCULAR | Status: DC | PRN

## 2011-03-31 MED ORDER — MIDAZOLAM HCL 2 MG/2ML IJ SOLN: 0.5000 mg | Freq: Once | INTRAMUSCULAR | Status: DC | PRN

## 2011-03-31 MED ORDER — MUPIROCIN 2 % EX OINT
TOPICAL_OINTMENT | Freq: Two times a day (BID) | CUTANEOUS | Status: DC
  Administered 2011-03-31: 1 via NASAL

## 2011-03-31 MED ORDER — OXYCODONE HCL 5 MG PO TABS
5.0000 mg | ORAL_TABLET | Freq: Three times a day (TID) | ORAL | Status: DC
  Administered 2011-03-31: 5 mg via ORAL

## 2011-03-31 SURGICAL SUPPLY — 45 items
CANISTER SUCTION 2500CC (MISCELLANEOUS) ×2 IMPLANT
CATH EMB 4FR 80CM (CATHETERS) ×2 IMPLANT
CLIP TI MEDIUM 6 (CLIP) ×2 IMPLANT
CLIP TI WIDE RED SMALL 6 (CLIP) ×2 IMPLANT
CLOTH BEACON ORANGE TIMEOUT ST (SAFETY) ×2 IMPLANT
COVER SURGICAL LIGHT HANDLE (MISCELLANEOUS) ×4 IMPLANT
DERMABOND ADVANCED (GAUZE/BANDAGES/DRESSINGS) ×2
DERMABOND ADVANCED .7 DNX12 (GAUZE/BANDAGES/DRESSINGS) ×2 IMPLANT
DRAPE INCISE IOBAN 66X45 STRL (DRAPES) ×2 IMPLANT
ELECT REM PT RETURN 9FT ADLT (ELECTROSURGICAL) ×2
ELECTRODE REM PT RTRN 9FT ADLT (ELECTROSURGICAL) ×1 IMPLANT
GAUZE SPONGE 4X4 12PLY STRL LF (GAUZE/BANDAGES/DRESSINGS) ×2 IMPLANT
GEL ULTRASOUND 20GR AQUASONIC (MISCELLANEOUS) IMPLANT
GLOVE BIO SURGEON STRL SZ 6.5 (GLOVE) ×2 IMPLANT
GLOVE BIO SURGEON STRL SZ7.5 (GLOVE) ×2 IMPLANT
GLOVE BIOGEL PI IND STRL 6.5 (GLOVE) ×1 IMPLANT
GLOVE BIOGEL PI IND STRL 7.0 (GLOVE) ×1 IMPLANT
GLOVE BIOGEL PI IND STRL 7.5 (GLOVE) ×1 IMPLANT
GLOVE BIOGEL PI INDICATOR 6.5 (GLOVE) ×1
GLOVE BIOGEL PI INDICATOR 7.0 (GLOVE) ×1
GLOVE BIOGEL PI INDICATOR 7.5 (GLOVE) ×1
GLOVE SURG SS PI 7.5 STRL IVOR (GLOVE) ×2 IMPLANT
GOWN PREVENTION PLUS XLARGE (GOWN DISPOSABLE) ×4 IMPLANT
GOWN STRL NON-REIN LRG LVL3 (GOWN DISPOSABLE) ×2 IMPLANT
GRAFT GORETEX 6X70  T/W (Vascular Products) ×1 IMPLANT
GRAFT GORETEX 6X70 T/W (Vascular Products) ×1 IMPLANT
KIT BASIN OR (CUSTOM PROCEDURE TRAY) ×2 IMPLANT
KIT ROOM TURNOVER OR (KITS) ×2 IMPLANT
LOOP VESSEL MINI RED (MISCELLANEOUS) IMPLANT
NS IRRIG 1000ML POUR BTL (IV SOLUTION) ×2 IMPLANT
PACK CV ACCESS (CUSTOM PROCEDURE TRAY) ×2 IMPLANT
PAD ARMBOARD 7.5X6 YLW CONV (MISCELLANEOUS) ×4 IMPLANT
SPONGE GAUZE 4X4 12PLY (GAUZE/BANDAGES/DRESSINGS) ×2 IMPLANT
SPONGE SURGIFOAM ABS GEL 100 (HEMOSTASIS) IMPLANT
STAPLER VISISTAT 35W (STAPLE) ×2 IMPLANT
SUT PROLENE 6 0 CC (SUTURE) ×4 IMPLANT
SUT VIC AB 2-0 CT1 27 (SUTURE) ×1
SUT VIC AB 2-0 CT1 TAPERPNT 27 (SUTURE) ×1 IMPLANT
SUT VIC AB 3-0 SH 27 (SUTURE) ×2
SUT VIC AB 3-0 SH 27X BRD (SUTURE) ×2 IMPLANT
SUT VICRYL 4-0 PS2 18IN ABS (SUTURE) ×4 IMPLANT
TOWEL OR 17X24 6PK STRL BLUE (TOWEL DISPOSABLE) ×2 IMPLANT
TOWEL OR 17X26 10 PK STRL BLUE (TOWEL DISPOSABLE) ×2 IMPLANT
UNDERPAD 30X30 INCONTINENT (UNDERPADS AND DIAPERS) IMPLANT
WATER STERILE IRR 1000ML POUR (IV SOLUTION) ×2 IMPLANT

## 2011-03-31 NOTE — Anesthesia Preprocedure Evaluation (Addendum)
Anesthesia Evaluation  Patient identified by MRN, date of birth, ID band Patient awake    Reviewed: Allergy & Precautions, H&P , NPO status , Patient's Chart, lab work & pertinent test results, reviewed documented beta blocker date and time   Airway Mallampati: II TM Distance: >3 FB Neck ROM: Full    Dental  (+) Dental Advisory Given, Edentulous Upper and Edentulous Lower   Pulmonary shortness of breath, Current Smoker,  clear to auscultation        Cardiovascular Exercise Tolerance: Poor hypertension, Pt. on medications +CHF Regular Normal    Neuro/Psych    GI/Hepatic Neg liver ROS, GERD-  Controlled,  Endo/Other  Diabetes mellitus-, Well Controlled, Type 2  Renal/GU CRF and DialysisRenal disease     Musculoskeletal   Abdominal   Peds  Hematology   Anesthesia Other Findings   Reproductive/Obstetrics                         Anesthesia Physical Anesthesia Plan  ASA: III  Anesthesia Plan:    Post-op Pain Management:    Induction:   Airway Management Planned: LMA  Additional Equipment:   Intra-op Plan:   Post-operative Plan: Extubation in OR  Informed Consent: I have reviewed the patients History and Physical, chart, labs and discussed the procedure including the risks, benefits and alternatives for the proposed anesthesia with the patient or authorized representative who has indicated his/her understanding and acceptance.   Dental advisory given  Plan Discussed with:   Anesthesia Plan Comments:         Anesthesia Quick Evaluation

## 2011-03-31 NOTE — H&P (View-Only) (Signed)
Vascular Surgery Consultation  Reason for Consult: Clotted thigh graft HPI: [name] is a 55 y.o. male who presents for evaluation of recurrent thrombosis of right thigh graft. This patient has had a right thigh graft in place for about 7 years which has had surgical revisions on 2 occasions. He has had multiple interventions by interventional radiology over the past several months to in the past 2 days which have resulted in recurrent thrombosis. He was sent for insertion of a hemodialysis catheter  Past Medical History  Diagnosis Date  . Renal insufficiency   . HTN (hypertension)   . Anxiety   . Back pain   . GERD (gastroesophageal reflux disease)   . Peripheral neuropathy   . Arthritis   . CHF (congestive heart failure)   . Active smoker   . Dialysis patient     M-W-F   Past Surgical History  Procedure Date  . Total hip arthroplasty   . Thyroidectomy   . Tooth extraction   . Mandible fracture surgery   . Dg av dialysis graft declot or    History   Social History  . Marital Status: Single    Spouse Name: N/A    Number of Children: N/A  . Years of Education: N/A   Social History Main Topics  . Smoking status: Current Everyday Smoker -- 1.0 packs/day for 35 years    Types: Cigarettes  . Smokeless tobacco: None  . Alcohol Use: No  . Drug Use: No  . Sexually Active: None   Other Topics Concern  . None   Social History Narrative  . None   History reviewed. No pertinent family history. No Known Allergies Prior to Admission medications   Medication Sig Start Date End Date Taking? Authorizing Provider  aspirin 325 MG EC tablet Take 325 mg by mouth daily.     Yes Historical Provider, MD  b complex-vitamin c-folic acid (NEPHRO-VITE) 0.8 MG TABS Take 0.8 mg by mouth at bedtime.    Yes Historical Provider, MD  colchicine 0.6 MG tablet Take 0.6 mg by mouth 2 (two) times daily as needed. For gout    Yes Historical Provider, MD  esomeprazole (NEXIUM) 40 MG capsule  Take 40 mg by mouth every evening.     Yes Historical Provider, MD  hydrOXYzine (ATARAX/VISTARIL) 25 MG tablet Take 25 mg by mouth every 8 (eight) hours as needed. For itching    Yes Historical Provider, MD  ibuprofen (ADVIL,MOTRIN) 200 MG tablet Take 200 mg by mouth every 6 (six) hours as needed. For pain    Yes Historical Provider, MD  oxyCODONE (OXY IR/ROXICODONE) 5 MG immediate release tablet Take 5 mg by mouth 3 (three) times daily.     Yes Historical Provider, MD  Sevelamer Carbonate (RENVELA) 2.4 G PACK Take 2.4 g by mouth 3 (three) times daily with meals.     Yes Historical Provider, MD  sildenafil (VIAGRA) 100 MG tablet Take 100 mg by mouth daily as needed. For erectile dysfunction     Yes Historical Provider, MD  traMADol (ULTRAM) 50 MG tablet Take 50 mg by mouth every 8 (eight) hours as needed. For pain;Maximum dose= 8 tablets per day    Yes Historical Provider, MD  calcium acetate (PHOSLO) 667 MG capsule Take 2,668 mg by mouth 3 (three) times daily with meals.      Historical Provider, MD  gabapentin (NEURONTIN) 100 MG capsule Take 100 mg by mouth at bedtime.      Historical Provider,  MD  sodium bicarbonate 650 MG tablet Take 650 mg by mouth 2 (two) times daily.      Historical Provider, MD     Positive ROS: Denies chest pain dyspnea on exertion PND orthopnea or hemoptysis All other systems have been reviewed and were otherwise negative with the exception of those mentioned in the HPI and as above.  Physical Exam: Filed Vitals:   03/28/11 0641  BP: 146/75  Pulse: 71  Temp: 97.6 F (36.4 C)  Resp: 16    General: Alert, no acute distress HEENT: Normal for age Cardiovascular: Regular rate and rhythm. Carotid pulses 2+, no bruits audible Respiratory: Clear to auscultation. No cyanosis, no use of accessory musculature GI: No organomegaly, abdomen is soft and non-tender Skin: No lesions in the area of chief complaint Neurologic: Sensation intact distally Psychiatric: Patient  is competent for consent with normal mood and affect Musculoskeletal: No obvious deformities Extremities: Right lower extremity reveals a thrombosed right thigh graft in a loop configuration. 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 We'll consider insertion of a new right thigh graft around the existing graft in the near future.  Josephina Gip, MD 03/28/2011 7:46 AM  Hemoptysis hemoptysis

## 2011-03-31 NOTE — Progress Notes (Signed)
DR FIELDS HERE. ABLE TO HEAR LOUD BRUIT W/DOPPLER. THIS IS ACCEPTABLE.

## 2011-03-31 NOTE — Anesthesia Postprocedure Evaluation (Signed)
  Anesthesia Post-op Note  Patient: [name]  Procedure(s) Performed:  INSERTION OF ARTERIOVENOUS (AV) GORE-TEX GRAFT THIGH - redo right thigh arteriovenous gortex graft using gore-tex stretch 6mm x 70cm  Patient Location: PACU  Anesthesia Type: General  Level of Consciousness: awake, alert , oriented and patient cooperative  Airway and Oxygen Therapy: Patient Spontanous Breathing and Patient connected to nasal cannula oxygen  Post-op Pain: mild  Post-op Assessment: Post-op Vital signs reviewed, Patient's Cardiovascular Status Stable, Respiratory Function Stable, Patent Airway, No signs of Nausea or vomiting and Pain level controlled  Post-op Vital Signs: stable  Complications: No apparent anesthesia complications

## 2011-03-31 NOTE — Preoperative (Signed)
Beta Blockers   Reason not to administer Beta Blockers:Not Applicable 

## 2011-03-31 NOTE — Progress Notes (Signed)
PA SAMANTHA HERE TO SEE PT BECAUSE WE CANNOT HEAR BRUIT OR FEEL THRILL (CHECKED BY 2 NURSES).

## 2011-03-31 NOTE — Interval H&P Note (Signed)
History and Physical Interval Note:  03/31/2011 11:53 AM  [name]  has presented today for surgery, with the diagnosis of End Stage Renal Disease  The various methods of treatment have been discussed with the patient and family. After consideration of risks, benefits and other options for treatment, the patient has consented to  Procedure(s): INSERTION OF ARTERIOVENOUS (AV) GORE-TEX GRAFT THIGH as a surgical intervention .  The patients' history has been reviewed, patient examined, no change in status, stable for surgery.  I have reviewed the patients' chart and labs.  Questions were answered to the patient's satisfaction.     Angelyn Osterberg E

## 2011-03-31 NOTE — Transfer of Care (Signed)
Immediate Anesthesia Transfer of Care Note  Patient: [name]  Procedure(s) Performed:  INSERTION OF ARTERIOVENOUS (AV) GORE-TEX GRAFT THIGH - redo right thigh arteriovenous gortex graft using gore-tex stretch 6mm x 70cm  Patient Location: PACU  Anesthesia Type: General  Level of Consciousness: awake, oriented, patient cooperative and responds to stimulation  Airway & Oxygen Therapy: Patient Spontanous Breathing and Patient connected to nasal cannula oxygen  Post-op Assessment: Report given to PACU RN, Post -op Vital signs reviewed and stable and Patient moving all extremities  Post vital signs: Reviewed and stable  Complications: No apparent anesthesia complications and Probable extra fluid on board

## 2011-03-31 NOTE — Op Note (Signed)
[facility] OPERATIVE NOTE  [name] 03-Sep-1955 [id number] 03/31/2011  PRE-OP DX: End Stage Renal Disease POST-OP DX: End Stage Renal Disease  Procedure(s): INSERTION OF ARTERIOVENOUS (AV) GORE-TEX GRAFT THIGH Right  Surgeon(s): Sherren Kerns, MD  ASSISTANT: Newton Pigg, PA-C  General  COMPLICATIONS: NONE  EBL PER ANESTHESIA NOTE  DISPOSITION: PACU IN STABLE CONDITION  PROCEDURE DETAIL: After obtaining informed consent, the patient was taken to the operating room. The patient was placed in supine position on the operating room table. After induction of general anesthesia and endotracheal intubation the patient's entire right upper abdomen and thigh were prepped and draped in usual sterile fashion. The patient's pannus was taped back to allow exposure to the right groin. An oblique incision was made in the right groin crease through a  pre-existing scar. The incision was carried down through the subcutaneous tissues down to level of the lateral limb of the pre-existing thigh graft. This was dissected free circumferentially. The medial limb of the thigh graft was also dissected free circumferentially. The existing thigh graft was occluded. Recent shuntogram had shown that there was severe graft degeneration. The operative plan was to replace the entire graft. Approximately 6 months ago the venous anastomosis had been revised high up under the inguinal ligament with a long spatulated patch onto the common femoral vein. It was decided that this was not going to be revised further. Next a 6 mm PTFE graft was brought up on the operative field. A transverse incision was made in the distal thigh. The graft was in tunneled subcutaneously in a loop configuration on the anterior thigh. This new graft was located lateral to the old graft. The patient was given 10,000 units of intravenous heparin. After 2 minutes circulation time, the graft was transected just above the  level of the arterial anastomosis. The arterial limb the graft was thrombectomized with a #4 Fogarty catheter excellent arterial inflow was obtained. This was controlled proximally with a Henley clamp. The new 6 mm PTFE graft was then sewn end-to-end to the old graft using a running 6-0 Prolene suture. At completion of the anastomosis a clamp was placed at the apex of the graft.  The graft was then pulled taut to length. The distal end of the venous limb of the graft was transected just above the level the venous anastomosis. The venous limb of the graft then thrombectomized with a  #4 Fogarty catheter and there was good venous backbleeding. This was probed with 3, 4 and 5 coronary dilators and found to easily accept these. The old venous limb was then clamped proximally with a Henley clamp. The new graft was cut to length and sewn end-to-end to the old graft using a running 6-0 Prolene suture. Just prior completion anastomosis was forebled backbled and thoroughly flushed. The anastomosis was secured,  clamps released, and there was a mild thrill in the graft immediately. Hemostasis was obtained with direct pressure and the assistance of 100 mg of protamine. Subcutaneous tissues in the groin incision closed in multiple layers of running 3 0 and 4-0 Vicryl subcuticular stitch. Dermabond was applied the groin incision. A transverse incision in the distal thigh was then closed with a running 3-0 Vicryl subcutaneous stitch and a 4 Vicryl subcuticular stitch.  The patient tolerated procedure well and there were no complications. Interest sponge and needle counts correct in the case. Patient was taken to the recovery room in stable condition.  Fabienne Bruns, MD [facility]  [facility] Office: [phone] Pager: [phone]

## 2011-03-31 NOTE — Anesthesia Procedure Notes (Signed)
Procedure Name: Intubation Date/Time: 03/31/2011 12:58 PM Performed by: Jefm Miles E Pre-anesthesia Checklist: Patient identified, Timeout performed, Emergency Drugs available, Suction available and Patient being monitored Patient Re-evaluated:Patient Re-evaluated prior to inductionOxygen Delivery Method: Circle System Utilized Preoxygenation: Pre-oxygenation with 100% oxygen Intubation Type: IV induction Ventilation: Two handed mask ventilation required Laryngoscope Size: Mac and 4 Grade View: Grade III Tube type: Oral Tube size: 7.5 mm Number of attempts: 2 Airway Equipment and Method: stylet and bougie stylet Placement Confirmation: positive ETCO2,  breath sounds checked- equal and bilateral and ETT inserted through vocal cords under direct vision Secured at: 25 cm Tube secured with: Tape Dental Injury: Teeth and Oropharynx as per pre-operative assessment  Difficulty Due To: Difficult Airway- due to large tongue Future Recommendations: Recommend- induction with short-acting agent, and alternative techniques readily available

## 2011-03-31 NOTE — Progress Notes (Signed)
PT WANTS TO GO HOME---DR FIELDS AND SAMANTHA PA AWARE AND SHE WILL PUT IN DC ORDER.

## 2011-04-02 ENCOUNTER — Encounter (HOSPITAL_COMMUNITY): Payer: Self-pay | Admitting: Vascular Surgery

## 2011-04-02 NOTE — Discharge Summary (Signed)
[facility] Discharge Summary  [name] October 31, 1955 55 y.o. male  [id number]  Admission Date: 03/31/2011  Discharge Date: 03/31/2011  Physician: No att. providers found  Admission Diagnosis: End Stage Renal Disease   HPI:   This is a 55 y.o. male who presents for evaluation of recurrent thrombosis of right thigh graft. This patient has had a right thigh graft in place for about 7 years which has had surgical revisions on 2 occasions. He has had multiple interventions by interventional radiology over the past several months to in the past 2 days which have resulted in recurrent thrombosis. He was sent for insertion of a hemodialysis catheter    Hospital Course:  The patient was admitted to the hospital and taken to the operating room on 03/31/2011 and underwent INSERTION OF ARTERIOVENOUS (AV) GORE-TEX GRAFT THIGH Right .  The pt tolerated the procedure well and was transported to the PACU in good condition.  He was discharged home that day.   Basename 03/31/11 0710  NA 136  K 4.7  CL --  CO2 --  GLUCOSE 110*  BUN --  CALCIUM --    Basename 03/31/11 0710  WBC --  HGB 14.3  HCT 42.0  PLT --    Basename 03/31/11 0704  INR 1.08     Discharge Instructions:   The patient is discharged to home with extensive instructions on wound care and progressive ambulation.  They are instructed not to drive or perform any heavy lifting until returning to see the physician in his office.  Discharge Orders    Future Orders Please Complete By Expires   Resume previous diet      Call MD for:  temperature >100.5      Call MD for:  redness, tenderness, or signs of infection (pain, swelling, bleeding, redness, odor or green/yellow discharge around incision site)      Call MD for:  severe or increased pain, loss or decreased feeling  in affected limb(s)      Lifting restrictions      Comments:   No lifting for 4 weeks   Remove dressing in 48 hours      Scheduling Instructions:   Shower daily with soap and water starting Thursday, Dec 6th.  Pat dry and keep groin incision covered with gauze sponge.    follow up with Dr. Darrick Penna in 2 weeks. Discharge Diagnosis:  End Stage Renal Disease  Secondary Diagnosis: Patient Active Problem List  Diagnoses  . Arthritis   Past Medical History  Diagnosis Date  . Renal insufficiency   . HTN (hypertension)   . Anxiety   . Back pain   . GERD (gastroesophageal reflux disease)   . Peripheral neuropathy   . Arthritis   . CHF (congestive heart failure)   . Active smoker   . Dialysis patient     M-W-F       [place], [place]  Home Medication Instructions BMW:[id number]   Printed on:04/02/11 2725  Medication Information                    sodium bicarbonate 650 MG tablet Take 650 mg by mouth 2 (two) times daily.             oxyCODONE (OXY IR/ROXICODONE) 5 MG immediate release tablet Take 5 mg by mouth 3 (three) times daily.             Sevelamer Carbonate (RENVELA) 2.4 G PACK Take 2.4 g by mouth  3 (three) times daily with meals.             gabapentin (NEURONTIN) 100 MG capsule Take 100 mg by mouth at bedtime.             colchicine 0.6 MG tablet Take 0.6 mg by mouth 2 (two) times daily as needed. For gout            traMADol (ULTRAM) 50 MG tablet Take 50 mg by mouth every 8 (eight) hours as needed. For pain;Maximum dose= 8 tablets per day            esomeprazole (NEXIUM) 40 MG capsule Take 40 mg by mouth every evening.             hydrOXYzine (ATARAX/VISTARIL) 25 MG tablet Take 25 mg by mouth every 8 (eight) hours as needed. For itching            ibuprofen (ADVIL,MOTRIN) 200 MG tablet Take 200 mg by mouth every 6 (six) hours as needed. For pain            aspirin 325 MG EC tablet Take 325 mg by mouth daily.             calcium acetate (PHOSLO) 667 MG capsule Take 2,668 mg by mouth 3 (three) times daily with meals.             sildenafil (VIAGRA) 100 MG tablet Take 100 mg by  mouth daily as needed. For erectile dysfunction             b complex-vitamin c-folic acid (NEPHRO-VITE) 0.8 MG TABS Take 0.8 mg by mouth at bedtime.              Disposition: home   Patient's condition: is Good   Newton Pigg, PA-C [facility] [phone] 04/02/2011  8:37 AM

## 2011-04-16 ENCOUNTER — Other Ambulatory Visit: Payer: Self-pay | Admitting: Nephrology

## 2011-04-16 ENCOUNTER — Ambulatory Visit
Admission: RE | Admit: 2011-04-16 | Discharge: 2011-04-16 | Disposition: A | Discharge status: Home / Self Care | Payer: Medicare Other | Source: Ambulatory Visit | Attending: Nephrology | Admitting: Nephrology

## 2011-04-16 DIAGNOSIS — R52 Pain, unspecified: Secondary | ICD-10-CM

## 2011-04-18 ENCOUNTER — Emergency Department (HOSPITAL_BASED_OUTPATIENT_CLINIC_OR_DEPARTMENT_OTHER)
Admission: EM | Admit: 2011-04-18 | Discharge: 2011-04-18 | Disposition: A | Discharge status: Home / Self Care | Payer: Medicare Other | Attending: Emergency Medicine | Admitting: Emergency Medicine

## 2011-04-18 ENCOUNTER — Encounter (HOSPITAL_BASED_OUTPATIENT_CLINIC_OR_DEPARTMENT_OTHER): Payer: Self-pay | Admitting: *Deleted

## 2011-04-18 ENCOUNTER — Emergency Department (INDEPENDENT_AMBULATORY_CARE_PROVIDER_SITE_OTHER): Payer: Medicare Other

## 2011-04-18 DIAGNOSIS — S39012A Strain of muscle, fascia and tendon of lower back, initial encounter: Secondary | ICD-10-CM

## 2011-04-18 DIAGNOSIS — Y92410 Unspecified street and highway as the place of occurrence of the external cause: Secondary | ICD-10-CM | POA: Insufficient documentation

## 2011-04-18 DIAGNOSIS — Z043 Encounter for examination and observation following other accident: Secondary | ICD-10-CM

## 2011-04-18 DIAGNOSIS — I10 Essential (primary) hypertension: Secondary | ICD-10-CM | POA: Insufficient documentation

## 2011-04-18 DIAGNOSIS — M5137 Other intervertebral disc degeneration, lumbosacral region: Secondary | ICD-10-CM

## 2011-04-18 DIAGNOSIS — S62609A Fracture of unspecified phalanx of unspecified finger, initial encounter for closed fracture: Secondary | ICD-10-CM

## 2011-04-18 DIAGNOSIS — S161XXA Strain of muscle, fascia and tendon at neck level, initial encounter: Secondary | ICD-10-CM

## 2011-04-18 DIAGNOSIS — Z79899 Other long term (current) drug therapy: Secondary | ICD-10-CM | POA: Insufficient documentation

## 2011-04-18 DIAGNOSIS — S139XXA Sprain of joints and ligaments of unspecified parts of neck, initial encounter: Secondary | ICD-10-CM | POA: Insufficient documentation

## 2011-04-18 DIAGNOSIS — F17200 Nicotine dependence, unspecified, uncomplicated: Secondary | ICD-10-CM | POA: Insufficient documentation

## 2011-04-18 DIAGNOSIS — I509 Heart failure, unspecified: Secondary | ICD-10-CM | POA: Insufficient documentation

## 2011-04-18 DIAGNOSIS — S335XXA Sprain of ligaments of lumbar spine, initial encounter: Secondary | ICD-10-CM | POA: Insufficient documentation

## 2011-04-18 DIAGNOSIS — M545 Low back pain: Secondary | ICD-10-CM

## 2011-04-18 MED ORDER — HYDROCODONE-ACETAMINOPHEN 5-325 MG PO TABS: 1.0000 | ORAL_TABLET | Freq: Two times a day (BID) | ORAL | Status: AC
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
## 2011-05-18 ENCOUNTER — Other Ambulatory Visit: Payer: Self-pay | Admitting: *Deleted

## 2011-05-26 ENCOUNTER — Encounter (HOSPITAL_COMMUNITY): Payer: Medicare Other

## 2011-05-29 ENCOUNTER — Other Ambulatory Visit (HOSPITAL_COMMUNITY): Payer: Self-pay | Admitting: Nephrology

## 2011-05-29 ENCOUNTER — Ambulatory Visit (HOSPITAL_COMMUNITY)
Admission: RE | Admit: 2011-05-29 | Discharge: 2011-05-29 | Disposition: A | Discharge status: Home / Self Care | Payer: Medicare Other | Source: Ambulatory Visit | Attending: Nephrology | Admitting: Nephrology

## 2011-05-29 DIAGNOSIS — T82898A Other specified complication of vascular prosthetic devices, implants and grafts, initial encounter: Secondary | ICD-10-CM | POA: Insufficient documentation

## 2011-05-29 DIAGNOSIS — N186 End stage renal disease: Secondary | ICD-10-CM | POA: Insufficient documentation

## 2011-05-29 DIAGNOSIS — G609 Hereditary and idiopathic neuropathy, unspecified: Secondary | ICD-10-CM | POA: Insufficient documentation

## 2011-05-29 DIAGNOSIS — F17200 Nicotine dependence, unspecified, uncomplicated: Secondary | ICD-10-CM | POA: Insufficient documentation

## 2011-05-29 DIAGNOSIS — I509 Heart failure, unspecified: Secondary | ICD-10-CM | POA: Insufficient documentation

## 2011-05-29 DIAGNOSIS — M549 Dorsalgia, unspecified: Secondary | ICD-10-CM | POA: Insufficient documentation

## 2011-05-29 DIAGNOSIS — Z79899 Other long term (current) drug therapy: Secondary | ICD-10-CM | POA: Insufficient documentation

## 2011-05-29 DIAGNOSIS — M129 Arthropathy, unspecified: Secondary | ICD-10-CM | POA: Insufficient documentation

## 2011-05-29 DIAGNOSIS — Z992 Dependence on renal dialysis: Secondary | ICD-10-CM | POA: Insufficient documentation

## 2011-05-29 DIAGNOSIS — Y832 Surgical operation with anastomosis, bypass or graft as the cause of abnormal reaction of the patient, or of later complication, without mention of misadventure at the time of the procedure: Secondary | ICD-10-CM | POA: Insufficient documentation

## 2011-05-29 DIAGNOSIS — I120 Hypertensive chronic kidney disease with stage 5 chronic kidney disease or end stage renal disease: Secondary | ICD-10-CM | POA: Insufficient documentation

## 2011-05-29 DIAGNOSIS — Z7982 Long term (current) use of aspirin: Secondary | ICD-10-CM | POA: Insufficient documentation

## 2011-05-29 DIAGNOSIS — Z96649 Presence of unspecified artificial hip joint: Secondary | ICD-10-CM | POA: Insufficient documentation

## 2011-05-29 DIAGNOSIS — F411 Generalized anxiety disorder: Secondary | ICD-10-CM | POA: Insufficient documentation

## 2011-05-29 DIAGNOSIS — K219 Gastro-esophageal reflux disease without esophagitis: Secondary | ICD-10-CM | POA: Insufficient documentation

## 2011-05-29 MED ORDER — IOHEXOL 300 MG/ML  SOLN
100.0000 mL | Freq: Once | INTRAMUSCULAR | Status: AC | PRN
  Administered 2011-05-29: 80 mL via INTRAVENOUS

## 2011-05-29 MED ORDER — ALTEPLASE 100 MG IV SOLR
INTRAVENOUS | Status: AC | PRN
  Administered 2011-05-29: 2 mg

## 2011-05-29 MED ORDER — HEPARIN SODIUM (PORCINE) 1000 UNIT/ML IJ SOLN
INTRAMUSCULAR | Status: AC
  Filled 2011-05-29: qty 1

## 2011-05-29 MED ORDER — ALTEPLASE 2 MG IJ SOLR
2.0000 mg | Freq: Once | INTRAMUSCULAR | Status: DC
  Filled 2011-05-29: qty 2

## 2011-05-29 MED ORDER — HEPARIN SODIUM (PORCINE) 1000 UNIT/ML IJ SOLN
INTRAMUSCULAR | Status: AC | PRN
  Administered 2011-05-29: 3000 [IU]

## 2011-05-29 NOTE — ED Notes (Signed)
Not having sedation as no ride home. Needs monitoring for declot

## 2011-05-29 NOTE — ED Notes (Addendum)
Dressing to right leg clean dry and intact and bruit present. Discharge instructions reviewed and copy given to patient. Verbalizes understanding. Discharged home with w/c per self to his vehicle to drive himself home.

## 2011-05-29 NOTE — ED Notes (Addendum)
In nurses station. Thrill felt over graft. Dressings gauze and tegaderm clean dry and intact. Awake, no pain. Taking fluids po and a sandwich.

## 2011-05-29 NOTE — Procedures (Signed)
Successful RT fem AVG declot with 6 and 7 mm PTA No comp Stable Ready to use Full report dictated in PACS

## 2011-05-29 NOTE — H&P (Signed)
[name] is an 56 y.o. male.   Chief Complaint: clotted right thigh dialysis graft HPI: Patient with ESRD and clotted right thigh AVGG presents today for thrombolysis/possible angioplasty/ stenting of graft or placement of new dialysis catheter if needed.  Past Medical History  Diagnosis Date  . Renal insufficiency   . HTN (hypertension)   . Anxiety   . Back pain   . GERD (gastroesophageal reflux disease)   . Peripheral neuropathy   . Arthritis   . CHF (congestive heart failure)   . Active smoker   . Dialysis patient     M-W-F    Past Surgical History  Procedure Date  . Total hip arthroplasty   . Thyroidectomy   . Tooth extraction   . Mandible fracture surgery   . Dg av dialysis graft declot or   . Insertion of dialysis catheter 03/28/2011    Procedure: INSERTION OF DIALYSIS CATHETER;  Surgeon: Pryor Ochoa, MD;  Location: [facility] OR;  Service: Vascular;  Laterality: Left;  . Av fistula placement 03/31/2011    Procedure: INSERTION OF ARTERIOVENOUS (AV) GORE-TEX GRAFT THIGH;  Surgeon: Sherren Kerns, MD;  Location: [facility] OR;  Service: Vascular;  Laterality: Right;  redo right thigh arteriovenous gortex graft using gore-tex stretch 6mm x 70cm    No family history on file. Social History:  reports that he has been smoking Cigarettes.  He has a 35 pack-year smoking history. He does not have any smokeless tobacco history on file. He reports that he does not drink alcohol or use illicit drugs.  Allergies: No Known Allergies  Medications Prior to Admission  Medication Sig Dispense Refill  . aspirin 325 MG EC tablet Take 325 mg by mouth daily.        Marland Kitchen b complex-vitamin c-folic acid (NEPHRO-VITE) 0.8 MG TABS Take 0.8 mg by mouth at bedtime.       . calcium acetate (PHOSLO) 667 MG capsule Take 2,668 mg by mouth 3 (three) times daily with meals.        . colchicine 0.6 MG tablet Take 0.6 mg by mouth 2 (two) times daily as needed. For gout       . esomeprazole (NEXIUM) 40 MG capsule  Take 40 mg by mouth every evening.        . gabapentin (NEURONTIN) 100 MG capsule Take 100 mg by mouth at bedtime.        . hydrOXYzine (ATARAX/VISTARIL) 25 MG tablet Take 25 mg by mouth every 8 (eight) hours as needed. For itching       . ibuprofen (ADVIL,MOTRIN) 200 MG tablet Take 200 mg by mouth every 6 (six) hours as needed. For pain       . oxyCODONE (OXY IR/ROXICODONE) 5 MG immediate release tablet Take 5 mg by mouth 3 (three) times daily.        . Sevelamer Carbonate (RENVELA) 2.4 G PACK Take 2.4 g by mouth 3 (three) times daily with meals.        . sildenafil (VIAGRA) 100 MG tablet Take 100 mg by mouth daily as needed. For erectile dysfunction        . sodium bicarbonate 650 MG tablet Take 650 mg by mouth 2 (two) times daily.        . traMADol (ULTRAM) 50 MG tablet Take 50 mg by mouth every 8 (eight) hours as needed. For pain;Maximum dose= 8 tablets per day        Medications Prior to Admission  Medication Dose  Route Frequency Provider Last Rate Last Dose  . alteplase (CATHFLO ACTIVASE) injection 2 mg  2 mg Intracatheter Once Casimiro Needle T. Shick, MD       Filed Vitals:   05/29/11 1251 05/29/11 1256 05/29/11 1301 05/29/11 1306  BP: 122/83 126/78 136/75 150/91  Pulse: 86 84 81 83  Resp: 19 19 22 14   SpO2: 99% 96% 95% 97%     Review of Systems  Constitutional: Negative for fever and chills.  Respiratory: Negative for cough and shortness of breath.   Cardiovascular: Negative for chest pain.  Gastrointestinal: Negative for nausea, vomiting and abdominal pain.  Neurological: Negative for headaches.  Vitals:   Physical Exam  Constitutional: He is oriented to person, place, and time. He appears well-developed and well-nourished.  Cardiovascular: Normal rate and regular rhythm.        No thrill/bruit in right thigh AVGG  Respiratory: Effort normal and breath sounds normal.  GI: Soft. Bowel sounds are normal.       obese  Musculoskeletal: Normal range of motion.  Neurological: He  is alert and oriented to person, place, and time.     Assessment/Plan Patient with ESRD and clotted right thigh AVGG; plan is for thrombolysis/possible angioplasty/stenting of graft or placement of new dialysis catheter if needed.  Tiarna Koppen,D KEVIN 05/29/2011, 11:45 AM

## 2011-06-01 ENCOUNTER — Other Ambulatory Visit (HOSPITAL_COMMUNITY): Payer: Self-pay | Admitting: *Deleted

## 2011-06-01 ENCOUNTER — Other Ambulatory Visit: Payer: Self-pay

## 2011-06-01 ENCOUNTER — Telehealth (HOSPITAL_COMMUNITY): Payer: Self-pay

## 2011-06-01 MED ORDER — CEFAZOLIN SODIUM-DEXTROSE 2-3 GM-% IV SOLR
2.0000 g | Freq: Once | INTRAVENOUS | Status: AC
  Administered 2011-06-02: 2 g via INTRAVENOUS
  Filled 2011-06-01: qty 50

## 2011-06-01 NOTE — Progress Notes (Signed)
The number on file for [name] is not his current number.  I call and spoke with his mother and [name] (she will be bring pt to hospital).  Santina Evans said that she might speak with [name] later tonight.  Santina Evans said that she would instruct him on the time to be at Short Stay- [phone] and NPO after MN and that he could take Nexium, Colchicine and Ultram in the am with a sip of water.

## 2011-06-02 ENCOUNTER — Encounter (HOSPITAL_COMMUNITY): Payer: Medicare Other

## 2011-06-02 ENCOUNTER — Encounter (HOSPITAL_COMMUNITY): Payer: Self-pay | Admitting: *Deleted

## 2011-06-02 ENCOUNTER — Encounter (HOSPITAL_COMMUNITY): Payer: Self-pay | Admitting: Anesthesiology

## 2011-06-02 ENCOUNTER — Ambulatory Visit (HOSPITAL_COMMUNITY): Payer: Medicare Other | Admitting: Anesthesiology

## 2011-06-02 ENCOUNTER — Encounter (HOSPITAL_COMMUNITY)
Admission: RE | Disposition: A | Discharge status: Home / Self Care | Payer: Self-pay | Source: Ambulatory Visit | Attending: Vascular Surgery

## 2011-06-02 ENCOUNTER — Ambulatory Visit (HOSPITAL_COMMUNITY)
Admission: RE | Admit: 2011-06-02 | Discharge: 2011-06-02 | Disposition: A | Discharge status: Home / Self Care | Payer: Medicare Other | Source: Ambulatory Visit | Attending: Vascular Surgery | Admitting: Vascular Surgery

## 2011-06-02 DIAGNOSIS — I509 Heart failure, unspecified: Secondary | ICD-10-CM | POA: Insufficient documentation

## 2011-06-02 DIAGNOSIS — Y849 Medical procedure, unspecified as the cause of abnormal reaction of the patient, or of later complication, without mention of misadventure at the time of the procedure: Secondary | ICD-10-CM | POA: Insufficient documentation

## 2011-06-02 DIAGNOSIS — T82898A Other specified complication of vascular prosthetic devices, implants and grafts, initial encounter: Secondary | ICD-10-CM | POA: Insufficient documentation

## 2011-06-02 DIAGNOSIS — N186 End stage renal disease: Secondary | ICD-10-CM | POA: Insufficient documentation

## 2011-06-02 DIAGNOSIS — E119 Type 2 diabetes mellitus without complications: Secondary | ICD-10-CM | POA: Insufficient documentation

## 2011-06-02 DIAGNOSIS — K219 Gastro-esophageal reflux disease without esophagitis: Secondary | ICD-10-CM | POA: Insufficient documentation

## 2011-06-02 DIAGNOSIS — I120 Hypertensive chronic kidney disease with stage 5 chronic kidney disease or end stage renal disease: Secondary | ICD-10-CM | POA: Insufficient documentation

## 2011-06-02 DIAGNOSIS — Z992 Dependence on renal dialysis: Secondary | ICD-10-CM | POA: Insufficient documentation

## 2011-06-02 HISTORY — DX: Inflammatory liver disease, unspecified: K75.9

## 2011-06-02 HISTORY — PX: THROMBECTOMY W/ EMBOLECTOMY: SHX2507

## 2011-06-02 HISTORY — DX: Nausea with vomiting, unspecified: R11.2

## 2011-06-02 HISTORY — DX: Other specified postprocedural states: Z98.890

## 2011-06-02 LAB — POCT I-STAT 4, (NA,K, GLUC, HGB,HCT)
Hemoglobin: 15.6 g/dL (ref 13.0–17.0)
Potassium: 4.7 mEq/L (ref 3.5–5.1)

## 2011-06-02 LAB — GLUCOSE, CAPILLARY: Glucose-Capillary: 138 mg/dL — ABNORMAL HIGH (ref 70–99)

## 2011-06-02 SURGERY — THROMBECTOMY ARTERIOVENOUS GORE-TEX GRAFT
Anesthesia: Monitor Anesthesia Care | Site: Leg Upper | Laterality: Right | Wound class: Dirty or Infected

## 2011-06-02 MED ORDER — LIDOCAINE HCL (CARDIAC) 20 MG/ML IV SOLN
INTRAVENOUS | Status: DC | PRN
  Administered 2011-06-02: 50 mg via INTRAVENOUS

## 2011-06-02 MED ORDER — ROCURONIUM BROMIDE 100 MG/10ML IV SOLN
INTRAVENOUS | Status: DC | PRN
  Administered 2011-06-02: 50 mg via INTRAVENOUS

## 2011-06-02 MED ORDER — FENTANYL CITRATE 0.05 MG/ML IJ SOLN
INTRAMUSCULAR | Status: DC | PRN
  Administered 2011-06-02: 50 ug via INTRAVENOUS
  Administered 2011-06-02: 100 ug via INTRAVENOUS
  Administered 2011-06-02 (×7): 50 ug via INTRAVENOUS

## 2011-06-02 MED ORDER — PHENYLEPHRINE HCL 10 MG/ML IJ SOLN
10.0000 mg | INTRAVENOUS | Status: DC | PRN
  Administered 2011-06-02: 10 ug/min via INTRAVENOUS

## 2011-06-02 MED ORDER — MEPERIDINE HCL 25 MG/ML IJ SOLN: 6.2500 mg | INTRAMUSCULAR | Status: DC | PRN

## 2011-06-02 MED ORDER — PROPOFOL 10 MG/ML IV EMUL
INTRAVENOUS | Status: DC | PRN
  Administered 2011-06-02: 150 mg via INTRAVENOUS

## 2011-06-02 MED ORDER — MUPIROCIN 2 % EX OINT
TOPICAL_OINTMENT | CUTANEOUS | Status: AC
  Administered 2011-06-02: 1
  Filled 2011-06-02: qty 22

## 2011-06-02 MED ORDER — PHENYLEPHRINE HCL 10 MG/ML IJ SOLN
INTRAMUSCULAR | Status: DC | PRN
  Administered 2011-06-02 (×4): 80 ug via INTRAVENOUS
  Administered 2011-06-02 (×2): 40 ug via INTRAVENOUS

## 2011-06-02 MED ORDER — HEPARIN SODIUM (PORCINE) 1000 UNIT/ML IJ SOLN
INTRAMUSCULAR | Status: DC | PRN
  Administered 2011-06-02: 8 mL via INTRAVENOUS

## 2011-06-02 MED ORDER — PROTAMINE SULFATE 10 MG/ML IV SOLN
INTRAVENOUS | Status: DC | PRN
  Administered 2011-06-02: 40 mg via INTRAVENOUS

## 2011-06-02 MED ORDER — HYDROMORPHONE HCL PF 1 MG/ML IJ SOLN
0.2500 mg | INTRAMUSCULAR | Status: DC | PRN
  Administered 2011-06-02 (×2): 0.5 mg via INTRAVENOUS

## 2011-06-02 MED ORDER — SODIUM CHLORIDE 0.9 % IV SOLN: INTRAVENOUS | Status: DC

## 2011-06-02 MED ORDER — SODIUM CHLORIDE 0.9 % IV SOLN
INTRAVENOUS | Status: DC | PRN
  Administered 2011-06-02: 10:00:00 via INTRAVENOUS

## 2011-06-02 MED ORDER — 0.9 % SODIUM CHLORIDE (POUR BTL) OPTIME
TOPICAL | Status: DC | PRN
  Administered 2011-06-02: 1000 mL

## 2011-06-02 MED ORDER — PROMETHAZINE HCL 25 MG/ML IJ SOLN
6.2500 mg | INTRAMUSCULAR | Status: DC | PRN
  Administered 2011-06-02: 6.25 mg via INTRAVENOUS

## 2011-06-02 MED ORDER — NEOSTIGMINE METHYLSULFATE 1 MG/ML IJ SOLN
INTRAMUSCULAR | Status: DC | PRN
  Administered 2011-06-02: 5 mg via INTRAVENOUS

## 2011-06-02 MED ORDER — GLYCOPYRROLATE 0.2 MG/ML IJ SOLN
INTRAMUSCULAR | Status: DC | PRN
  Administered 2011-06-02: .08 mg via INTRAVENOUS

## 2011-06-02 MED ORDER — SODIUM CHLORIDE 0.9 % IR SOLN
Status: DC | PRN
  Administered 2011-06-02: 11:00:00

## 2011-06-02 MED ORDER — OXYCODONE-ACETAMINOPHEN 5-325 MG PO TABS: 1.0000 | ORAL_TABLET | ORAL | Status: AC | PRN

## 2011-06-02 MED ORDER — HYDROMORPHONE HCL PF 1 MG/ML IJ SOLN
INTRAMUSCULAR | Status: AC
  Filled 2011-06-02: qty 1

## 2011-06-02 MED ORDER — ONDANSETRON HCL 4 MG/2ML IJ SOLN
INTRAMUSCULAR | Status: DC | PRN
  Administered 2011-06-02: 4 mg via INTRAVENOUS

## 2011-06-02 SURGICAL SUPPLY — 46 items
CANISTER SUCTION 2500CC (MISCELLANEOUS) ×2 IMPLANT
CATH EMB 4FR 80CM (CATHETERS) ×2 IMPLANT
CLIP TI MEDIUM 6 (CLIP) ×2 IMPLANT
CLIP TI WIDE RED SMALL 6 (CLIP) ×2 IMPLANT
CLOTH BEACON ORANGE TIMEOUT ST (SAFETY) ×2 IMPLANT
COVER SURGICAL LIGHT HANDLE (MISCELLANEOUS) ×4 IMPLANT
DERMABOND ADVANCED (GAUZE/BANDAGES/DRESSINGS) ×1
DERMABOND ADVANCED .7 DNX12 (GAUZE/BANDAGES/DRESSINGS) ×1 IMPLANT
DRAPE X-RAY CASS 24X20 (DRAPES) IMPLANT
ELECT REM PT RETURN 9FT ADLT (ELECTROSURGICAL) ×2
ELECTRODE REM PT RTRN 9FT ADLT (ELECTROSURGICAL) ×1 IMPLANT
GAUZE SPONGE 4X4 16PLY XRAY LF (GAUZE/BANDAGES/DRESSINGS) ×2 IMPLANT
GEL ULTRASOUND 20GR AQUASONIC (MISCELLANEOUS) IMPLANT
GLOVE BIO SURGEON STRL SZ7.5 (GLOVE) ×2 IMPLANT
GLOVE BIOGEL PI IND STRL 6.5 (GLOVE) ×1 IMPLANT
GLOVE BIOGEL PI IND STRL 7.0 (GLOVE) ×2 IMPLANT
GLOVE BIOGEL PI IND STRL 7.5 (GLOVE) ×1 IMPLANT
GLOVE BIOGEL PI INDICATOR 6.5 (GLOVE) ×1
GLOVE BIOGEL PI INDICATOR 7.0 (GLOVE) ×2
GLOVE BIOGEL PI INDICATOR 7.5 (GLOVE) ×1
GLOVE ECLIPSE 6.5 STRL STRAW (GLOVE) ×2 IMPLANT
GLOVE ECLIPSE 7.0 STRL STRAW (GLOVE) ×4 IMPLANT
GOWN BRE IMP SLV AUR XL STRL (GOWN DISPOSABLE) ×2 IMPLANT
GOWN EXTRA PROTECTION XL (GOWNS) ×2 IMPLANT
GOWN STRL NON-REIN LRG LVL3 (GOWN DISPOSABLE) ×8 IMPLANT
GRAFT GORETEX STND 4X7 (Vascular Products) ×2 IMPLANT
GRAFT GORETEXSTD 4X7 (Vascular Products) ×1 IMPLANT
KIT BASIN OR (CUSTOM PROCEDURE TRAY) ×2 IMPLANT
KIT ROOM TURNOVER OR (KITS) ×2 IMPLANT
NS IRRIG 1000ML POUR BTL (IV SOLUTION) ×2 IMPLANT
PACK CV ACCESS (CUSTOM PROCEDURE TRAY) ×2 IMPLANT
PAD ARMBOARD 7.5X6 YLW CONV (MISCELLANEOUS) ×4 IMPLANT
SET COLLECT BLD 21X3/4 12 (NEEDLE) IMPLANT
SPONGE SURGIFOAM ABS GEL 100 (HEMOSTASIS) IMPLANT
STOPCOCK 4 WAY LG BORE MALE ST (IV SETS) IMPLANT
SUT PROLENE 6 0 BV (SUTURE) ×6 IMPLANT
SUT VIC AB 3-0 SH 27 (SUTURE) ×2
SUT VIC AB 3-0 SH 27X BRD (SUTURE) ×2 IMPLANT
SUT VICRYL 4-0 PS2 18IN ABS (SUTURE) ×4 IMPLANT
SWAB CULTURE LIQUID MINI MALE (MISCELLANEOUS) ×2 IMPLANT
TOWEL OR 17X24 6PK STRL BLUE (TOWEL DISPOSABLE) ×4 IMPLANT
TOWEL OR 17X26 10 PK STRL BLUE (TOWEL DISPOSABLE) ×2 IMPLANT
TUBE ANAEROBIC SPECIMEN COL (MISCELLANEOUS) ×2 IMPLANT
TUBING EXTENTION W/L.L. (IV SETS) IMPLANT
UNDERPAD 30X30 INCONTINENT (UNDERPADS AND DIAPERS) ×2 IMPLANT
WATER STERILE IRR 1000ML POUR (IV SOLUTION) ×2 IMPLANT

## 2011-06-02 NOTE — Transfer of Care (Signed)
Immediate Anesthesia Transfer of Care Note  Patient: [name]  Procedure(s) Performed:  THROMBECTOMY ARTERIOVENOUS GORE-TEX GRAFT - Thrombectomy right thigh arteriovenous gortex graft;  revision by  replacement of large portion of graft with 7mm gore-tex   Patient Location: PACU  Anesthesia Type: General  Level of Consciousness: awake, alert  and oriented  Airway & Oxygen Therapy: Patient Spontanous Breathing and Patient connected to face mask oxygen  Post-op Assessment: Report given to PACU RN, Post -op Vital signs reviewed and stable and Patient moving all extremities X 4  Post vital signs: Reviewed and stable  Complications: No apparent anesthesia complications

## 2011-06-02 NOTE — Op Note (Signed)
NAME: BRAYDAN [name]   MRN: [id number] DOB: 1955/11/16    DATE OF OPERATION: 06/02/2011  PREOP DIAGNOSIS: chronic kidney disease. Clotted right thigh AV graft.  POSTOP DIAGNOSIS: same  PROCEDURE:  1. Thrombectomy of right thigh AV graft 2. Replacement of the majority of the graft as it was poorly incorporated.  SURGEON: Di Kindle. Edilia Bo, MD, FACS  ASSIST: Newton Pigg PA  ANESTHESIA: Gen.   EBL: minimal  INDICATIONS: [name] is a 56 y.o. male who had a right thigh AV graft placed in December of 2012. This clotted last week and he underwent thrombolysis by interventional radiology. This clotted that night. He has a functioning catheter. I was asked to attempt thrombectomy of his graft. I did review his fistulogram which showed some diffuse irregularity in the graft. The arterial anastomosis was not visualized. The venous anastomosis looked reasonable.  FINDINGS: The graft was poorly incorporated. There was no gross purulence. Intraoperative culture was sent. I replaced the majority of the graft. Of note, the groin was somewhat macerated and we tried to avoid making an incision in this area because of the very high risk of wound healing problems and infection.  TECHNIQUE: The patient was taken to the operating room and received a general anesthetic. The right thigh and groin were prepped and draped in the usual sterile fashion. Because the right groin was made serrated I elected to not make an incision in this area. The pannus was taped superiorly. An incision was made over the arterial limb the graft at both the arterial and also along the medial aspect of the thigh over the venous end of the graft. The graft was dissected free of both ends. The graft was not incorporated at all although there was no purulence. The only other option would've been to abandon the graft and removed the graft entirely. I elected to replace the graft by tunneling and a completely new segment of 7 mm  PTFE graft around the outside of the old graft in hopes that we can get this to heal and salvage this right thigh graft. This was done using one distal counterincision. After the graft was tunneled the patient was heparinized. At the arterial end of the graft graft thrombectomy was achieved using a #4 Fogarty catheter. The arterial plug was clearly retrieved. The new segment of graft was sewn end-to-end to the old graft using continuous 6-0 Prolene suture. At the venous end of the graft venous thrombectomy was performed and there was good backbleeding. After no further clot was retrieved the graft was irrigated with heparinized saline and the new segment of graft cut the appropriate length and sewn end-to-end to the venous limb the graft using continuous 6-0 Prolene suture. At completion was a good thrill in the graft. Heparin was partially reversed with protamine. The wounds were closed the deep layer 3-0 Vicryl and the skin closed with 4-0 Vicryl. Sterile dressing was applied the patient tolerated the procedure well and was transferred to the recovery room in stable condition. All needle and sponge counts were correct.  Waverly Ferrari, MD, FACS [facility]  DATE OF DICTATION:   06/02/2011

## 2011-06-02 NOTE — Anesthesia Preprocedure Evaluation (Addendum)
Anesthesia Evaluation  Patient identified by MRN, date of birth, ID band Patient awake    History of Anesthesia Complications (+) PONV  Airway Mallampati: II  Neck ROM: Full    Dental  (+) Edentulous Upper and Edentulous Lower   Pulmonary shortness of breath,  clear to auscultation        Cardiovascular hypertension, +CHF Regular Normal    Neuro/Psych  Neuromuscular disease    GI/Hepatic GERD-  ,(+) Hepatitis -  Endo/Other  Diabetes mellitus-  Renal/GU CRF, ESRF and DialysisRenal disease     Musculoskeletal   Abdominal (+) obese,   Peds  Hematology   Anesthesia Other Findings   Reproductive/Obstetrics                          Anesthesia Physical Anesthesia Plan  ASA: III  Anesthesia Plan: MAC   Post-op Pain Management:    Induction: Intravenous  Airway Management Planned: Simple Face Mask  Additional Equipment:   Intra-op Plan:   Post-operative Plan:   Informed Consent: I have reviewed the patients History and Physical, chart, labs and discussed the procedure including the risks, benefits and alternatives for the proposed anesthesia with the patient or authorized representative who has indicated his/her understanding and acceptance.     Plan Discussed with: CRNA and Surgeon  Anesthesia Plan Comments:         Anesthesia Quick Evaluation

## 2011-06-02 NOTE — Progress Notes (Signed)
Called to Short Stay to dc fluids going to hemo cath; fluids stopped to venous port; port flushed w. 10cc ns, followed by 2.4cc heparin (1000 units/ml); both ports capped and clamped for safety;  Unable to chart on MAR or scan heparin for EPIC;

## 2011-06-02 NOTE — H&P (Signed)
Vascular and Vein Specialist of [facility]  Patient name: [name] MRN: [id number] DOB: 11-15-1955 Sex: male  REASON FOR CONSULT: clotted right thigh AVG  HPI: [name] is a 56 y.o. male who had a right thigh AVG placed in Dec 2012. Clotted last Friday. Had throbolysis but graft clotted that night. Presents for surgical thrombectomy.    Past Medical History  Diagnosis Date  . HTN (hypertension)   . Anxiety   . Back pain   . GERD (gastroesophageal reflux disease)   . Peripheral neuropathy   . Arthritis   . CHF (congestive heart failure)   . Active smoker   . Dialysis patient     M-W-F  . Renal failure     MWF dialysis at [facility]  . Shortness of breath   . Diabetes mellitus     diet controlled  . 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 PONV (postoperative nausea and vomiting)     History reviewed. No pertinent family history.  SOCIAL HISTORY: History  Substance Use Topics  . Smoking status: Current Everyday Smoker -- 1.0 packs/day for 35 years    Types: Cigarettes  . Smokeless tobacco: Not on file  . Alcohol Use: No    No Known Allergies  Current Facility-Administered Medications  Medication Dose Route Frequency Provider Last Rate Last Dose  . 0.9 %  sodium chloride infusion   Intravenous Continuous Chuck Hint, MD      . ceFAZolin (ANCEF) IVPB 2 g/50 mL premix  2 g Intravenous Once Chuck Hint, MD      . mupirocin ointment (BACTROBAN) 2 %        1 application at 06/02/11 4098    REVIEW OF SYSTEMS: Arly.Keller ] denotes positive finding; [  ] denotes negative finding CARDIOVASCULAR:  [ ]  chest pain   [ ]  chest pressure   [ ]  palpitations   [ ]  orthopnea   [ ]  dyspnea on exertion   [ ]  claudication   [ ]  rest pain   [ ]  DVT   [ ]  phlebitis PULMONARY:   [ ]  productive cough   [ ]  asthma   [ ]  wheezing NEUROLOGIC:   [ ]  weakness  [ ]  paresthesias  [ ]  aphasia  [ ]  amaurosis  [ ]  dizziness HEMATOLOGIC:   [ ]  bleeding problems   [  ] clotting disorders MUSCULOSKELETAL:  [ ]  joint pain   [ ]  joint swelling [ ]  leg swelling GASTROINTESTINAL: [ ]   blood in stool  [ ]   hematemesis GENITOURINARY:  Arly.Keller ]  anuric PSYCHIATRIC:  [ ]  history of major depression INTEGUMENTARY:  [ ]  rashes  [ ]  ulcers CONSTITUTIONAL:  [ ]  fever   [ ]  chills  PHYSICAL EXAM: Filed Vitals:   06/02/11 0701  BP: 106/68  Pulse: 93  Temp: 97.8 F (36.6 C)  TempSrc: Oral  Resp: 20  SpO2: 95%   There is no height or weight on file to calculate BMI. GENERAL: The patient is a well-nourished male, in no acute distress. The vital signs are documented above. CARDIOVASCULAR: There is a regular rate and rhythm without significant murmur appreciated.  PULMONARY: There is good air exchange bilaterally without wheezing or rales. ABDOMEN: Soft and non-tender with normal pitched bowel sounds.  MUSCULOSKELETAL: There are no major deformities or cyanosis. NEUROLOGIC: No focal weakness or paresthesias are detected. SKIN: There are no ulcers or rashes noted.  PSYCHIATRIC: The patient has a normal affect.  DATA:  Lab Results  Component Value Date   WBC 10.1 06/11/2010   HGB 14.3 03/31/2011   HCT 42.0 03/31/2011   MCV 90.2 06/11/2010   PLT 181 06/11/2010   Lab Results  Component Value Date   NA 136 03/31/2011   K 4.7 03/31/2011   CL 98 06/11/2010   CO2 21 06/11/2010   Lab Results  Component Value Date   CREATININE 12.00* 06/11/2010   Lab Results  Component Value Date   INR 1.08 03/31/2011   INR 0.91 06/09/2010   INR 1.06 06/03/2010   Lab Results  Component Value Date   HGBA1C  Value: 6.3 (NOTE)                                                                       According to the ADA Clinical Practice Recommendations for 2011, when HbA1c is used as a screening test:   >=6.5%   Diagnostic of Diabetes Mellitus           (if abnormal result  is confirmed)  5.7-6.4%   Increased risk of developing Diabetes Mellitus  References:Diagnosis and Classification of  Diabetes Mellitus,Diabetes Care,2011,34(Suppl 1):S62-S69 and Standards of Medical Care in         Diabetes - 2011,Diabetes Care,2011,34  (Suppl 1):S11-S61.* 06/03/2010   CBG (last 3)  No results found for this basename: GLUCAP:3 in the last 72 hours  Fistulogram reviewed. Mild irregularity of AVG. Art anastomosis not visualized.   MEDICAL ISSUES: Plan thrombectomy of right thigh Avg. Has functionning catheter for HD tomorrow at [facility].  DICKSON,CHRISTOPHER S V[facility] Vein Specialists of [facility]: [phone]

## 2011-06-02 NOTE — Anesthesia Postprocedure Evaluation (Signed)
  Anesthesia Post-op Note  Patient: [name]  Procedure(s) Performed:  THROMBECTOMY ARTERIOVENOUS GORE-TEX GRAFT - Thrombectomy right thigh arteriovenous gortex graft;  revision by  replacement of large portion of graft with 7mm gore-tex   Patient Location: PACU  Anesthesia Type: General  Level of Consciousness: awake and sedated  Airway and Oxygen Therapy: Patient Spontanous Breathing  Post-op Pain: mild  Post-op Assessment: Post-op Vital signs reviewed  Post-op Vital Signs: stable  Complications: No apparent anesthesia complications

## 2011-06-02 NOTE — Progress Notes (Signed)
Dr. Edilia Bo called to look at raw red area in right groin.  Okay to proceed with surgery

## 2011-06-02 NOTE — Preoperative (Signed)
Beta Blockers   Reason not to administer Beta Blockers:Not Applicable 

## 2011-06-03 ENCOUNTER — Encounter (HOSPITAL_COMMUNITY): Payer: Self-pay | Admitting: Vascular Surgery

## 2011-06-05 LAB — WOUND CULTURE: Culture: NO GROWTH

## 2011-06-07 LAB — ANAEROBIC CULTURE

## 2011-06-16 ENCOUNTER — Encounter: Payer: Self-pay | Admitting: Physician Assistant

## 2011-06-17 ENCOUNTER — Ambulatory Visit: Payer: Medicare Other | Admitting: Physician Assistant

## 2011-07-22 ENCOUNTER — Encounter: Payer: Self-pay | Admitting: Neurosurgery

## 2011-07-23 ENCOUNTER — Encounter: Payer: Self-pay | Admitting: Neurosurgery

## 2011-07-23 ENCOUNTER — Ambulatory Visit (INDEPENDENT_AMBULATORY_CARE_PROVIDER_SITE_OTHER): Payer: Medicare Other | Admitting: Physician Assistant

## 2011-07-23 VITALS — BP 100/60 | HR 78 | Resp 20 | Ht 71.0 in | Wt 289.0 lb

## 2011-07-23 DIAGNOSIS — N186 End stage renal disease: Secondary | ICD-10-CM | POA: Insufficient documentation

## 2011-07-23 NOTE — Progress Notes (Signed)
VASCULAR & VEIN SPECIALISTS OF [place] Postoperative Visit hemodialysis access   Date of Surgery:  06/02/11 Surgeon: Edilia Bo HD:  yes HD:  Days:  M/W/F   CC: f/u for thrombectomy of right thigh AVG and replacement of the majority of the graft as it was poorly incorporated.  HPI:  This is a 56 y.o. male who returns today s/p  thrombectomy of right thigh AVG and replacement of the majority of the graft as it was poorly incorporated.  This is his first visit back since his surgery.  He has not had any problems until they stuck his graft last Friday and it bled into the surrounding areas of the graft.  He states that it is much improved, but still sore where it was swollen.  He denies any steal symptoms. .  PHYSICAL EXAMINATION:  Filed Vitals:   07/23/11 1455  BP: 100/60  Pulse: 78  Resp: 20     Incisions are well healed.  The distal/lateral incision is scabbed, but there is no evidence of infection. sensation in digits is intact; There is  Thrill; there is bruit. The graft/fistula is easily palpable  Extremity:  RLE is warm and well perfused.  ASSESSMENT/PLAN:  NHIA HEAPHY is a 56 y.o. year old male who presents s/p  thrombectomy of right thigh AVG and replacement of the majority of the graft as it was poorly incorporated..  -It has been about 7 weeks since the graft was in place and is able to be used.  It sounds as if there was some extravasation from the graft and this has healed. -would recommend giving the graft a break until Monday and then try using the graft again. -the pt will f/u with [place] prn.  Newton Pigg, PA-C [facility] [phone]  Clinic MD:   Darrick Penna

## 2011-08-11 ENCOUNTER — Other Ambulatory Visit: Payer: Self-pay | Admitting: *Deleted

## 2011-08-11 ENCOUNTER — Other Ambulatory Visit: Payer: Self-pay | Admitting: Thoracic Diseases

## 2011-08-11 ENCOUNTER — Encounter (HOSPITAL_COMMUNITY)
Admission: RE | Admit: 2011-08-11 | Discharge: 2011-08-11 | Disposition: A | Discharge status: Home / Self Care | Payer: Medicare Other | Source: Ambulatory Visit | Attending: Surgery | Admitting: Surgery

## 2011-08-11 DIAGNOSIS — N186 End stage renal disease: Secondary | ICD-10-CM

## 2011-08-11 DIAGNOSIS — Z452 Encounter for adjustment and management of vascular access device: Secondary | ICD-10-CM | POA: Insufficient documentation

## 2011-08-11 NOTE — Progress Notes (Signed)
Dressing to left neck clean dry and intact.  No complaints.

## 2011-08-11 NOTE — Progress Notes (Signed)
VASCULAR AND VEIN SPECIALISTS Catheter Removal Procedure Note  Diagnosis: ESRD with Functioning AVF/AVGG  Plan:  Remove left diatek catheter  Consent signed:  yes Time out completed:  yes Coumadin:  no PT/INR (if applicable):   Other labs:   Procedure: 1.  Sterile prepping and draping over catheter area 2. 9 ml 2% lidocaine plain instilled at removal site. 3.  left catheter removed in its entirety with cuff in tact. 4.  Complications: none  5. Tip of catheter sent for culture:  no   Patient tolerated procedure well:  yes Pressure held, no bleeding noted, dressing applied Instructions given to the pt regarding wound care and bleeding.  Other:  [name] 08/11/2011 11:38 AM  VASCULAR AND VEIN SPECIALISTS SHORT STAY H&P  CC: ESRD   HPI: [name] is a 56 y.o. male who has been on HD through  functioning Hemodialysis access in the right lower extremity. They are here for HD catheter removal. Pt. denies signs of steal syndrome.  Past Medical History  Diagnosis Date  . HTN (hypertension)   . Anxiety   . Back pain   . GERD (gastroesophageal reflux disease)   . Peripheral neuropathy   . Arthritis   . CHF (congestive heart failure)   . Active smoker   . Dialysis patient     M-W-F  . Renal failure     MWF dialysis at [facility]  . Shortness of breath   . Diabetes mellitus     diet controlled  . 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 PONV (postoperative nausea and vomiting)     FH:  Non-Contributory  Social HX History  Substance Use Topics  . Smoking status: Current Everyday Smoker -- 1.0 packs/day for 35 years    Types: Cigarettes  . Smokeless tobacco: Not on file  . Alcohol Use: No    Allergies No Known Allergies  Medications Current Outpatient Prescriptions  Medication Sig Dispense Refill  . aspirin 325 MG EC tablet Take 325 mg by mouth daily.        Marland Kitchen b complex-vitamin c-folic acid (NEPHRO-VITE) 0.8 MG TABS Take 0.8 mg  by mouth at bedtime.       . calcium acetate (PHOSLO) 667 MG capsule Take 2,668 mg by mouth 3 (three) times daily with meals.        . colchicine 0.6 MG tablet Take 0.6 mg by mouth 2 (two) times daily as needed. For gout      . esomeprazole (NEXIUM) 40 MG capsule Take 40 mg by mouth every evening.       . gabapentin (NEURONTIN) 100 MG capsule Take 100 mg by mouth at bedtime.       . hydrOXYzine (ATARAX/VISTARIL) 25 MG tablet Take 25 mg by mouth every 8 (eight) hours as needed. For itching       . ibuprofen (ADVIL,MOTRIN) 200 MG tablet Take 200 mg by mouth every 6 (six) hours as needed. For pain       . oxyCODONE (OXY IR/ROXICODONE) 5 MG immediate release tablet Take 5 mg by mouth 3 (three) times daily.        . Sevelamer Carbonate (RENVELA) 2.4 G PACK Take 2.4 g by mouth 3 (three) times daily with meals.        . sildenafil (VIAGRA) 100 MG tablet Take 100 mg by mouth daily as needed. For erectile dysfunction        . sodium bicarbonate  650 MG tablet Take 650 mg by mouth 2 (two) times daily.          Labs COAG Lab Results  Component Value Date   INR 1.08 03/31/2011   INR 0.91 06/09/2010   INR 1.06 06/03/2010   No results found for this basename: PTT    PHYSICAL EXAM  There were no vitals filed for this visit.  General:  WDWN in NAD HENT: WNL Eyes: Pupils equal Pulmonary: normal non-labored breathing  Cardiac: RRR, Skin: normal, no cyanosis, jaundice, pallor or bruising Extremities without ischemic changes, no Gangrene , no cellulitis; no open wounds;   There is a  good bruit in the right thigh AVF.   Impression: This is a 56 y.o. male who has a functioning HD access.  Plan: Removal of Left IJ HD catheter Aalyah Mansouri J 08/11/2011 11:39 AM

## 2011-08-24 ENCOUNTER — Other Ambulatory Visit (HOSPITAL_COMMUNITY): Payer: Self-pay | Admitting: Internal Medicine

## 2011-08-24 DIAGNOSIS — N186 End stage renal disease: Secondary | ICD-10-CM

## 2011-08-25 ENCOUNTER — Other Ambulatory Visit (HOSPITAL_COMMUNITY): Payer: Self-pay | Admitting: Internal Medicine

## 2011-08-25 ENCOUNTER — Encounter (HOSPITAL_COMMUNITY): Payer: Self-pay

## 2011-08-25 ENCOUNTER — Other Ambulatory Visit (HOSPITAL_COMMUNITY): Payer: Self-pay

## 2011-08-25 ENCOUNTER — Ambulatory Visit (HOSPITAL_COMMUNITY)
Admission: RE | Admit: 2011-08-25 | Discharge: 2011-08-25 | Disposition: A | Discharge status: Home / Self Care | Payer: Medicare Other | Source: Ambulatory Visit | Attending: Internal Medicine | Admitting: Internal Medicine

## 2011-08-25 VITALS — BP 136/89 | HR 73 | Resp 18

## 2011-08-25 DIAGNOSIS — N186 End stage renal disease: Secondary | ICD-10-CM

## 2011-08-25 DIAGNOSIS — T82898A Other specified complication of vascular prosthetic devices, implants and grafts, initial encounter: Secondary | ICD-10-CM | POA: Insufficient documentation

## 2011-08-25 DIAGNOSIS — Y849 Medical procedure, unspecified as the cause of abnormal reaction of the patient, or of later complication, without mention of misadventure at the time of the procedure: Secondary | ICD-10-CM | POA: Insufficient documentation

## 2011-08-25 DIAGNOSIS — E119 Type 2 diabetes mellitus without complications: Secondary | ICD-10-CM | POA: Insufficient documentation

## 2011-08-25 DIAGNOSIS — Z992 Dependence on renal dialysis: Secondary | ICD-10-CM | POA: Insufficient documentation

## 2011-08-25 DIAGNOSIS — K219 Gastro-esophageal reflux disease without esophagitis: Secondary | ICD-10-CM | POA: Insufficient documentation

## 2011-08-25 DIAGNOSIS — I120 Hypertensive chronic kidney disease with stage 5 chronic kidney disease or end stage renal disease: Secondary | ICD-10-CM | POA: Insufficient documentation

## 2011-08-25 DIAGNOSIS — I509 Heart failure, unspecified: Secondary | ICD-10-CM | POA: Insufficient documentation

## 2011-08-25 LAB — POTASSIUM: Potassium: 5.8 mEq/L — ABNORMAL HIGH (ref 3.5–5.1)

## 2011-08-25 MED ORDER — IOHEXOL 300 MG/ML  SOLN
100.0000 mL | Freq: Once | INTRAMUSCULAR | Status: AC | PRN
  Administered 2011-08-25: 30 mL via INTRAVENOUS

## 2011-08-25 MED ORDER — MIDAZOLAM HCL 5 MG/5ML IJ SOLN
INTRAMUSCULAR | Status: DC | PRN
  Administered 2011-08-25 (×3): 1 mg via INTRAVENOUS

## 2011-08-25 MED ORDER — FENTANYL CITRATE 0.05 MG/ML IJ SOLN
INTRAMUSCULAR | Status: DC | PRN
  Administered 2011-08-25 (×3): 50 ug via INTRAVENOUS

## 2011-08-25 MED ORDER — MIDAZOLAM HCL 2 MG/2ML IJ SOLN
INTRAMUSCULAR | Status: AC
  Filled 2011-08-25: qty 4

## 2011-08-25 MED ORDER — HEPARIN SODIUM (PORCINE) 1000 UNIT/ML IJ SOLN
INTRAMUSCULAR | Status: DC | PRN
  Administered 2011-08-25: 3000 [IU] via INTRAVENOUS

## 2011-08-25 MED ORDER — ALTEPLASE 2 MG IJ SOLR
2.0000 mg | INTRAMUSCULAR | Status: AC
  Administered 2011-08-25: 2 mg
  Filled 2011-08-25: qty 2

## 2011-08-25 MED ORDER — HEPARIN SODIUM (PORCINE) 1000 UNIT/ML IJ SOLN
INTRAMUSCULAR | Status: AC
  Administered 2011-08-26: 10 mL via INTRAVENOUS
  Filled 2011-08-25: qty 1

## 2011-08-25 MED ORDER — FENTANYL CITRATE 0.05 MG/ML IJ SOLN
INTRAMUSCULAR | Status: AC
  Filled 2011-08-25: qty 4

## 2011-08-25 NOTE — ED Notes (Signed)
K - 5.8 resulted.  A.Kirkman, PA paged

## 2011-08-25 NOTE — Discharge Instructions (Signed)
Arteriovenous (AV) Access for Hemodialysis An arteriovenous (AV) access is a surgically created or placed tube that allows for repeated access to the blood in your body. This access is required for hemodialysis, a type of dialysis. Dialysis is a treatment process that filters and cleans the blood in order to eliminate toxic wastes from the body when the kidneys fail to do this on their own. There are several different access methods used for hemodialysis. ACCESS METHODS  Double lumen catheter. A flexible tube with 2 channels may be used on a temporary or long-term basis. A long-term use catheter often has a cuff that holds it in place. The catheter is surgically placed and tunneled under the skin. This catheter is often placed when dialysis is needed in an emergency, such as when the kidneys suddenly stop working. It may also be needed when a permanent AV access fails or has not yet been placed. A catheter is usually placed into one of the following:   Large vein under your collarbone (subclavian vein).   Large vein in your neck (jugular vein).   Temporarily, in the large vein in your groin (femoral vein).   AV graft. A man-made (synthetic) material may be used to connect an artery and vein in the arm or thigh. This graft takes around 2 weeks to develop (mature) and is often placed a few weeks prior to use. A graft can generally last from 1 to 2 years.   AV fistula. A minor surgical procedure may be done to connect an artery and vein, creating a fistula. This causes arterial blood to flow directly into a vein. The vein gets larger, allowing easier access for dialysis. The fistula takes around 12 weeks to mature and must be placed several months before dialysis is anticipated. A fistula provides the best access for hemodialysis and can last for several years.  It is critical that veins in patients at high risk to develop kidney failure are preserved. This may include avoiding blood pressure checks and  intravenous (IV) or lab draws from the arm. This maximizes the chance for creating a functioning AV access when needed. Although a fistula is the most desirable access to use for hemodialysis, it may not be possible. If the veins are not large enough or there is no time to wait for a fistula to mature, a graft or catheter may be used. RISKS AND COMPLICATIONS   Double lumen catheter. A catheter may develop serious infections. It may also develop a clot (thrombosis) and fail. A catheter can cause clots in the vein in which it is placed. A subclavian vein catheter is the most likely to cause thrombosis. When the subclavian vein clots, it makes it very difficult for the patient to sustain an AV graft or fistula. When possible, catheters should be avoided and a more permanent AV access should be placed.   AV graft. A graft may swell after surgery, but this should decrease as it heals. A graft may stop working properly due to thrombosis or the diameter of the tube getting smaller. The tube can eventually become blocked (stenosis). If a partial blockage is found relatively early, it can be treated. Left untreated, the stenosis will progress until the vessel is completely blocked. Infection may also occur.   AV fistula. Infection and thrombosis are the biggest risks with a fistula. However, the rates for infection and stenosis are lower with fistulas than the other 2 methods. Frequent thrombosis may require creating a backup fistula at another site.   This will allow for dialysis when one access is blocked.  HOME CARE INSTRUCTIONS   Keep the site of the cut (incision) clean and dry while it heals. This helps prevent infection.   If you have a catheter, do not shower while the incision is healing. After it is healed, ask your caregiver for recommendations on showering. The bandage (dressing) will be changed at the dialysis center. Do not remove this dressing. However, if it becomes wet or loose, a sterile gauze  dressing may be placed over the site and secured by placing adhesive tape on the edges.   If you have a graft or fistula, clean the site daily until it heals completely. Stitches will be removed, usually after about 10 to 14 days. Once the access is being used for dialysis, a small dressing will be placed over the needle sites after the treatment is done. Keep this dressing on for at least 12 hours. Keep your arm or thigh clean and dry during this time.   A small amount of bleeding is normal, especially if the access is new. If you have a large amount of bleeding and cannot stop it, this is not normal. Call your caregiver right away. You will be taught how to hold your graft or fistula sites to stop the bleeding.  If you have a graft or fistula:  A "bruit" is a noise that is heard with a stethoscope and a "thrill" is a vibration felt over the graft or fistula. The presence of the bruit and thrill indicate that the access is working. You will be taught to feel for the thrill each day. If this is not felt, the access may be clotted. Call your caregiver.   You may use the arm freely after the site heals. Keep the following in mind:   Avoid pressure on the arm.   Avoid lifting heavy objects with the arm.   Avoid sleeping on the arm with the graft or fistula.   Avoid wearing tight-sleeved shirts or jewelry around the graft or fistula.   Do not allow blood pressure monitoring or needle punctures on the side where the graft or fistula is located.   With permission from your caregiver, you may do exercises to help with blood flow through a fistula. These exercises involve squeezing a rubber ball or other soft objects as instructed.  SEEK MEDICAL CARE IF:   Chills develop.   You have an oral temperature above 102 F (38.9 C).   Swelling around the graft or fistula gets worse.   New pain develops.   Unusual bleeding develops.   Pus or other fluid (drainage) is seen at the AV access site.    Skin redness or red streaking is seen on the skin around, above, or below the AV access.  SEEK IMMEDIATE MEDICAL CARE IF:   Pain, numbness, or an unusual pale skin color develops in the hand on the side of your fistula.   Dizziness or weakness develops that you have not had before.   Shortness of breath develops.   Chest pain develops.   The AV access has bleeding that cannot be easily controlled.  Wear a medical alert bracelet to let caregivers know you are a dialysis patient, so they can care for your veins appropriately. Document Released: 07/04/2002 Document Revised: 04/02/2011 Document Reviewed: 09/10/2009 Exit[employer] Patient Information 2012 [employer]. 

## 2011-08-25 NOTE — Procedures (Signed)
R thigh AVG declot PTA 7 mm

## 2011-08-25 NOTE — H&P (Signed)
Chief Complaint: Thrombosed (R)LE Av dialysis graft HPI: [name] is an 56 y.o. male with a history of ESRD and previous thrombosed dialysis access. Presents today for thrombolysis/possible angioplasty/ stenting of graft or placement of new dialysis catheter if needed.   Past Medical History:  Past Medical History  Diagnosis Date  . HTN (hypertension)   . Anxiety   . Back pain   . GERD (gastroesophageal reflux disease)   . Peripheral neuropathy   . Arthritis   . CHF (congestive heart failure)   . Active smoker   . Dialysis patient     M-W-F  . Renal failure     MWF dialysis at [facility]  . Shortness of breath   . Diabetes mellitus     diet controlled  . 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 PONV (postoperative nausea and vomiting)     Past Surgical History:  Past Surgical History  Procedure Date  . Total hip arthroplasty   . Thyroidectomy   . Tooth extraction   . Mandible fracture surgery   . Dg av dialysis graft declot or   . Insertion of dialysis catheter 03/28/2011    Procedure: INSERTION OF DIALYSIS CATHETER;  Surgeon: Pryor Ochoa, MD;  Location: [facility] OR;  Service: Vascular;  Laterality: Left;  . Av fistula placement 03/31/2011    Procedure: INSERTION OF ARTERIOVENOUS (AV) GORE-TEX GRAFT THIGH;  Surgeon: Sherren Kerns, MD;  Location: [facility] OR;  Service: Vascular;  Laterality: Right;  redo right thigh arteriovenous gortex graft using gore-tex stretch 6mm x 70cm  . Thrombectomy w/ embolectomy 06/02/2011    Procedure: THROMBECTOMY ARTERIOVENOUS GORE-TEX GRAFT;  Surgeon: Chuck Hint, MD;  Location: [facility] OR;  Service: Vascular;  Laterality: Right;  Thrombectomy right thigh arteriovenous gortex graft;  revision by  replacement of large portion of graft with 7mm gore-tex     Family History:  Family History  Problem Relation Age of Onset  . Diabetes Mother   . Stroke Mother   . Heart disease Mother   . Kidney disease Father   . Hyperlipidemia  Father     Social History:  reports that he has been smoking Cigarettes.  He has a 35 pack-year smoking history. He has quit using smokeless tobacco. His smokeless tobacco use included Chew. He reports that he does not drink alcohol or use illicit drugs.  Allergies: No Known Allergies  Medications: Medications Prior to Admission   Medication  Sig  Dispense  Refill   .  aspirin 325 MG EC tablet  Take 325 mg by mouth daily.     Marland Kitchen  b complex-vitamin c-folic acid (NEPHRO-VITE) 0.8 MG TABS  Take 0.8 mg by mouth at bedtime.     .  calcium acetate (PHOSLO) 667 MG capsule  Take 2,668 mg by mouth 3 (three) times daily with meals.     .  colchicine 0.6 MG tablet  Take 0.6 mg by mouth 2 (two) times daily as needed. For gout     .  esomeprazole (NEXIUM) 40 MG capsule  Take 40 mg by mouth every evening.     .  gabapentin (NEURONTIN) 100 MG capsule  Take 100 mg by mouth at bedtime.     .  hydrOXYzine (ATARAX/VISTARIL) 25 MG tablet  Take 25 mg by mouth every 8 (eight) hours as needed. For itching     .  ibuprofen (ADVIL,MOTRIN) 200 MG tablet  Take 200 mg  by mouth every 6 (six) hours as needed. For pain     .  oxyCODONE (OXY IR/ROXICODONE) 5 MG immediate release tablet  Take 5 mg by mouth 3 (three) times daily.     .  Sevelamer Carbonate (RENVELA) 2.4 G PACK  Take 2.4 g by mouth 3 (three) times daily with meals.     .  sildenafil (VIAGRA) 100 MG tablet  Take 100 mg by mouth daily as needed. For erectile dysfunction     .  sodium bicarbonate 650 MG tablet  Take 650 mg by mouth 2 (two) times daily.     .  traMADol (ULTRAM) 50 MG tablet  Take 50 mg by mouth every 8 (eight) hours as needed. For pain;Maximum dose= 8 tablets per day        Please HPI for pertinent positives, otherwise complete 10 system ROS negative.  Blood pressure 125/77, pulse 72, resp. rate 19, SpO2 100.00%. There is no height or weight on file to calculate BMI.   General Appearance:  Alert, cooperative, no distress, appears stated age    Head:  Normocephalic, without obvious abnormality, atraumatic  ENT: Unremarkable  Neck: Supple, symmetrical, trachea midline, no adenopathy, thyroid: not enlarged, symmetric, no tenderness/mass/nodules  Lungs:   Clear to auscultation bilaterally, no w/r/r, respirations unlabored without use of accessory muscles.  Chest Wall:  No tenderness or deformity  Heart:  Regular rate and rhythm, S1, S2 normal, no murmur, rub or gallop. Carotids 2+ without bruit.  Extremities: Extremities normal, atraumatic, no cyanosis or edema  Pulses: 2+ and symmetric  Skin: Skin color, texture, turgor normal, no rashes or lesions  Neurologic: Normal affect, no gross deficits.   No results found for this or any previous visit (from the past 48 hour(s)). No results found.  Assessment/Plan Patient with ESRD and clotted right thigh AVGG; plan is for thrombolysis/possible angioplasty/stenting of graft or placement of new dialysis catheter if needed. Procedure discussed which he familiar with as he has had this several times previously. No new questions. Consent signed in chart.   Brayton El PA-C 08/25/2011, 9:59 AM

## 2011-08-26 ENCOUNTER — Ambulatory Visit (HOSPITAL_COMMUNITY)
Admission: RE | Admit: 2011-08-26 | Discharge: 2011-08-26 | Disposition: A | Discharge status: Home / Self Care | Payer: Medicare Other | Source: Ambulatory Visit | Attending: Vascular Surgery | Admitting: Vascular Surgery

## 2011-08-26 ENCOUNTER — Encounter (HOSPITAL_COMMUNITY): Payer: Self-pay | Admitting: Anesthesiology

## 2011-08-26 ENCOUNTER — Encounter (HOSPITAL_COMMUNITY)
Admission: RE | Disposition: A | Discharge status: Home / Self Care | Payer: Self-pay | Source: Ambulatory Visit | Attending: Vascular Surgery

## 2011-08-26 ENCOUNTER — Telehealth (HOSPITAL_COMMUNITY): Payer: Self-pay | Admitting: *Deleted

## 2011-08-26 ENCOUNTER — Other Ambulatory Visit: Payer: Self-pay

## 2011-08-26 ENCOUNTER — Ambulatory Visit (HOSPITAL_COMMUNITY): Payer: Medicare Other | Admitting: Anesthesiology

## 2011-08-26 DIAGNOSIS — I871 Compression of vein: Secondary | ICD-10-CM | POA: Insufficient documentation

## 2011-08-26 DIAGNOSIS — F411 Generalized anxiety disorder: Secondary | ICD-10-CM | POA: Insufficient documentation

## 2011-08-26 DIAGNOSIS — Z992 Dependence on renal dialysis: Secondary | ICD-10-CM | POA: Insufficient documentation

## 2011-08-26 DIAGNOSIS — I120 Hypertensive chronic kidney disease with stage 5 chronic kidney disease or end stage renal disease: Secondary | ICD-10-CM | POA: Insufficient documentation

## 2011-08-26 DIAGNOSIS — K08109 Complete loss of teeth, unspecified cause, unspecified class: Secondary | ICD-10-CM | POA: Insufficient documentation

## 2011-08-26 DIAGNOSIS — I509 Heart failure, unspecified: Secondary | ICD-10-CM | POA: Insufficient documentation

## 2011-08-26 DIAGNOSIS — E119 Type 2 diabetes mellitus without complications: Secondary | ICD-10-CM | POA: Insufficient documentation

## 2011-08-26 DIAGNOSIS — N186 End stage renal disease: Secondary | ICD-10-CM | POA: Insufficient documentation

## 2011-08-26 DIAGNOSIS — T82898A Other specified complication of vascular prosthetic devices, implants and grafts, initial encounter: Secondary | ICD-10-CM | POA: Insufficient documentation

## 2011-08-26 DIAGNOSIS — Y832 Surgical operation with anastomosis, bypass or graft as the cause of abnormal reaction of the patient, or of later complication, without mention of misadventure at the time of the procedure: Secondary | ICD-10-CM | POA: Insufficient documentation

## 2011-08-26 DIAGNOSIS — K219 Gastro-esophageal reflux disease without esophagitis: Secondary | ICD-10-CM | POA: Insufficient documentation

## 2011-08-26 DIAGNOSIS — F17200 Nicotine dependence, unspecified, uncomplicated: Secondary | ICD-10-CM | POA: Insufficient documentation

## 2011-08-26 DIAGNOSIS — Z8619 Personal history of other infectious and parasitic diseases: Secondary | ICD-10-CM | POA: Insufficient documentation

## 2011-08-26 DIAGNOSIS — Z01810 Encounter for preprocedural cardiovascular examination: Secondary | ICD-10-CM | POA: Insufficient documentation

## 2011-08-26 DIAGNOSIS — G609 Hereditary and idiopathic neuropathy, unspecified: Secondary | ICD-10-CM | POA: Insufficient documentation

## 2011-08-26 LAB — GLUCOSE, CAPILLARY
Glucose-Capillary: 95 mg/dL (ref 70–99)
Glucose-Capillary: 117 mg/dL — ABNORMAL HIGH (ref 70–99)

## 2011-08-26 LAB — SURGICAL PCR SCREEN: MRSA, PCR: NEGATIVE

## 2011-08-26 LAB — POCT I-STAT 4, (NA,K, GLUC, HGB,HCT)
Glucose, Bld: 87 mg/dL (ref 70–99)
HCT: 39 % (ref 39.0–52.0)
Hemoglobin: 13.3 g/dL (ref 13.0–17.0)

## 2011-08-26 SURGERY — THROMBECTOMY AND REVISION OF ARTERIOVENTOUS (AV) GORETEX  GRAFT
Anesthesia: General | Site: Leg Upper | Laterality: Right | Wound class: Clean

## 2011-08-26 MED ORDER — PHENYLEPHRINE HCL 10 MG/ML IJ SOLN
INTRAMUSCULAR | Status: DC | PRN
  Administered 2011-08-26: 40 ug via INTRAVENOUS
  Administered 2011-08-26: 80 ug via INTRAVENOUS
  Administered 2011-08-26: 40 ug via INTRAVENOUS

## 2011-08-26 MED ORDER — ONDANSETRON HCL 4 MG/2ML IJ SOLN
INTRAMUSCULAR | Status: DC | PRN
  Administered 2011-08-26 (×2): 4 mg via INTRAVENOUS

## 2011-08-26 MED ORDER — ONDANSETRON HCL 4 MG/2ML IJ SOLN: 4.0000 mg | Freq: Once | INTRAMUSCULAR | Status: DC | PRN

## 2011-08-26 MED ORDER — 0.9 % SODIUM CHLORIDE (POUR BTL) OPTIME
TOPICAL | Status: DC | PRN
  Administered 2011-08-26: 1000 mL

## 2011-08-26 MED ORDER — CEFAZOLIN SODIUM-DEXTROSE 2-3 GM-% IV SOLR
2.0000 g | INTRAVENOUS | Status: AC
  Administered 2011-08-26: 2 g via INTRAVENOUS
  Filled 2011-08-26 (×2): qty 50

## 2011-08-26 MED ORDER — SODIUM POLYSTYRENE SULFONATE 15 GM/60ML PO SUSP
15.0000 g | ORAL | Status: DC
  Filled 2011-08-26: qty 60

## 2011-08-26 MED ORDER — SODIUM CHLORIDE 0.9 % IV SOLN: INTRAVENOUS | Status: DC

## 2011-08-26 MED ORDER — MUPIROCIN 2 % EX OINT
TOPICAL_OINTMENT | Freq: Two times a day (BID) | CUTANEOUS | Status: DC
  Filled 2011-08-26: qty 22

## 2011-08-26 MED ORDER — MORPHINE SULFATE 2 MG/ML IJ SOLN: 0.0500 mg/kg | INTRAMUSCULAR | Status: DC | PRN

## 2011-08-26 MED ORDER — FENTANYL CITRATE 0.05 MG/ML IJ SOLN
INTRAMUSCULAR | Status: DC | PRN
  Administered 2011-08-26 (×3): 50 ug via INTRAVENOUS
  Administered 2011-08-26: 25 ug via INTRAVENOUS
  Administered 2011-08-26 (×4): 50 ug via INTRAVENOUS
  Administered 2011-08-26: 25 ug via INTRAVENOUS

## 2011-08-26 MED ORDER — PROPOFOL 10 MG/ML IV EMUL
INTRAVENOUS | Status: DC | PRN
  Administered 2011-08-26: 200 mg via INTRAVENOUS

## 2011-08-26 MED ORDER — SODIUM CHLORIDE 0.9 % IR SOLN
Status: DC | PRN
  Administered 2011-08-26: 14:00:00

## 2011-08-26 MED ORDER — OXYCODONE HCL 5 MG PO TABS: 5.0000 mg | ORAL_TABLET | Freq: Four times a day (QID) | ORAL | Status: DC | PRN

## 2011-08-26 MED ORDER — MUPIROCIN 2 % EX OINT
TOPICAL_OINTMENT | CUTANEOUS | Status: AC
  Administered 2011-08-26: 1 via NASAL
  Filled 2011-08-26: qty 22

## 2011-08-26 MED ORDER — CEFAZOLIN SODIUM 1-5 GM-% IV SOLN: 1.0000 g | Freq: Once | INTRAVENOUS | Status: DC

## 2011-08-26 MED ORDER — HYDROMORPHONE HCL PF 1 MG/ML IJ SOLN
0.2500 mg | INTRAMUSCULAR | Status: DC | PRN
  Administered 2011-08-26 (×2): 0.5 mg via INTRAVENOUS

## 2011-08-26 MED ORDER — SODIUM CHLORIDE 0.9 % IV SOLN
INTRAVENOUS | Status: DC | PRN
  Administered 2011-08-26 (×2): via INTRAVENOUS

## 2011-08-26 SURGICAL SUPPLY — 78 items
BAG DECANTER FOR FLEXI CONT (MISCELLANEOUS) ×3 IMPLANT
CANISTER SUCTION 2500CC (MISCELLANEOUS) ×3 IMPLANT
CATH CANNON HEMO 15F 50CM (CATHETERS) IMPLANT
CATH CANNON HEMO 15FR 19 (HEMODIALYSIS SUPPLIES) IMPLANT
CATH CANNON HEMO 15FR 23CM (HEMODIALYSIS SUPPLIES) IMPLANT
CATH CANNON HEMO 15FR 31CM (HEMODIALYSIS SUPPLIES) IMPLANT
CATH CANNON HEMO 15FR 32CM (HEMODIALYSIS SUPPLIES) IMPLANT
CATH EMB 4FR 80CM (CATHETERS) ×9 IMPLANT
CHLORAPREP W/TINT 26ML (MISCELLANEOUS) IMPLANT
CLIP TI MEDIUM 6 (CLIP) ×3 IMPLANT
CLIP TI WIDE RED SMALL 6 (CLIP) ×3 IMPLANT
CLOTH BEACON ORANGE TIMEOUT ST (SAFETY) ×3 IMPLANT
COVER PROBE W GEL 5X96 (DRAPES) IMPLANT
COVER SURGICAL LIGHT HANDLE (MISCELLANEOUS) ×6 IMPLANT
DECANTER SPIKE VIAL GLASS SM (MISCELLANEOUS) ×3 IMPLANT
DERMABOND ADHESIVE PROPEN (GAUZE/BANDAGES/DRESSINGS) ×1
DERMABOND ADVANCED (GAUZE/BANDAGES/DRESSINGS) ×1
DERMABOND ADVANCED .7 DNX12 (GAUZE/BANDAGES/DRESSINGS) ×2 IMPLANT
DERMABOND ADVANCED .7 DNX6 (GAUZE/BANDAGES/DRESSINGS) ×2 IMPLANT
DRAPE C-ARM 42X72 X-RAY (DRAPES) IMPLANT
DRAPE CHEST BREAST 15X10 FENES (DRAPES) IMPLANT
DRAPE INCISE IOBAN 66X45 STRL (DRAPES) ×6 IMPLANT
DRAPE ORTHO SPLIT 77X108 STRL (DRAPES) ×1
DRAPE SURG ORHT 6 SPLT 77X108 (DRAPES) ×2 IMPLANT
DRAPE X-RAY CASS 24X20 (DRAPES) IMPLANT
DRSG COVADERM 4X8 (GAUZE/BANDAGES/DRESSINGS) ×3 IMPLANT
ELECT REM PT RETURN 9FT ADLT (ELECTROSURGICAL) ×6
ELECTRODE REM PT RTRN 9FT ADLT (ELECTROSURGICAL) ×4 IMPLANT
GAUZE SPONGE 2X2 8PLY STRL LF (GAUZE/BANDAGES/DRESSINGS) IMPLANT
GAUZE SPONGE 4X4 16PLY XRAY LF (GAUZE/BANDAGES/DRESSINGS) IMPLANT
GEL ULTRASOUND 20GR AQUASONIC (MISCELLANEOUS) IMPLANT
GLOVE BIO SURGEON STRL SZ 6.5 (GLOVE) ×6 IMPLANT
GLOVE BIO SURGEON STRL SZ7 (GLOVE) ×3 IMPLANT
GLOVE BIO SURGEON STRL SZ7.5 (GLOVE) ×3 IMPLANT
GLOVE BIOGEL PI IND STRL 7.0 (GLOVE) ×4 IMPLANT
GLOVE BIOGEL PI INDICATOR 7.0 (GLOVE) ×2
GLOVE ECLIPSE 6.5 STRL STRAW (GLOVE) ×3 IMPLANT
GLOVE SS BIOGEL STRL SZ 6.5 (GLOVE) ×2 IMPLANT
GLOVE SUPERSENSE BIOGEL SZ 6.5 (GLOVE) ×1
GLOVE SURG SS PI 7.5 STRL IVOR (GLOVE) ×3 IMPLANT
GOWN PREVENTION PLUS XLARGE (GOWN DISPOSABLE) ×3 IMPLANT
GOWN SRG XL XLNG 56XLVL 4 (GOWN DISPOSABLE) ×2 IMPLANT
GOWN STRL NON-REIN LRG LVL3 (GOWN DISPOSABLE) ×15 IMPLANT
GOWN STRL NON-REIN XL XLG LVL4 (GOWN DISPOSABLE) ×1
GRAFT GORETEX 6X10 (Vascular Products) ×3 IMPLANT
KIT BASIN OR (CUSTOM PROCEDURE TRAY) ×3 IMPLANT
KIT ROOM TURNOVER OR (KITS) ×3 IMPLANT
LOOP VESSEL MAXI BLUE (MISCELLANEOUS) ×3 IMPLANT
LOOP VESSEL MINI RED (MISCELLANEOUS) IMPLANT
NEEDLE 18GX1X1/2 (RX/OR ONLY) (NEEDLE) IMPLANT
NEEDLE HYPO 25GX1X1/2 BEV (NEEDLE) IMPLANT
NS IRRIG 1000ML POUR BTL (IV SOLUTION) ×3 IMPLANT
PACK CV ACCESS (CUSTOM PROCEDURE TRAY) ×3 IMPLANT
PACK SURGICAL SETUP 50X90 (CUSTOM PROCEDURE TRAY) IMPLANT
PAD ARMBOARD 7.5X6 YLW CONV (MISCELLANEOUS) ×6 IMPLANT
SET COLLECT BLD 21X3/4 12 (NEEDLE) IMPLANT
SPONGE GAUZE 2X2 STER 10/PKG (GAUZE/BANDAGES/DRESSINGS)
SPONGE SURGIFOAM ABS GEL 100 (HEMOSTASIS) IMPLANT
STOPCOCK 4 WAY LG BORE MALE ST (IV SETS) IMPLANT
SUT ETHILON 3 0 PS 1 (SUTURE) IMPLANT
SUT MNCRL AB 4-0 PS2 18 (SUTURE) ×3 IMPLANT
SUT PROLENE 5 0 C 1 24 (SUTURE) ×3 IMPLANT
SUT PROLENE 6 0 CC (SUTURE) ×6 IMPLANT
SUT VIC AB 2-0 CT1 27 (SUTURE) ×1
SUT VIC AB 2-0 CT1 TAPERPNT 27 (SUTURE) ×2 IMPLANT
SUT VIC AB 3-0 SH 27 (SUTURE) ×1
SUT VIC AB 3-0 SH 27X BRD (SUTURE) ×2 IMPLANT
SUT VICRYL 4-0 PS2 18IN ABS (SUTURE) ×3 IMPLANT
SYR 20CC LL (SYRINGE) ×3 IMPLANT
SYR 30ML LL (SYRINGE) IMPLANT
SYR 5ML LL (SYRINGE) IMPLANT
SYR CONTROL 10ML LL (SYRINGE) ×3 IMPLANT
SYRINGE 10CC LL (SYRINGE) ×3 IMPLANT
TOWEL OR 17X24 6PK STRL BLUE (TOWEL DISPOSABLE) ×6 IMPLANT
TOWEL OR 17X26 10 PK STRL BLUE (TOWEL DISPOSABLE) ×3 IMPLANT
TUBING EXTENTION W/L.L. (IV SETS) IMPLANT
UNDERPAD 30X30 INCONTINENT (UNDERPADS AND DIAPERS) IMPLANT
WATER STERILE IRR 1000ML POUR (IV SOLUTION) ×3 IMPLANT

## 2011-08-26 NOTE — Preoperative (Signed)
Beta Blockers   Reason not to administer Beta Blockers:Not Applicable 

## 2011-08-26 NOTE — Telephone Encounter (Signed)
Post procedure follow up call.  Pt unavailable, spoke with his mother.  Says pt's graft isn't working and he is a cone now.

## 2011-08-26 NOTE — Transfer of Care (Signed)
Immediate Anesthesia Transfer of Care Note  Patient: [name]  Procedure(s) Performed: Procedure(s) (LRB): THROMBECTOMY AND REVISION OF ARTERIOVENTOUS (AV) GORETEX  GRAFT (Right)  Patient Location: PACU  Anesthesia Type: General  Level of Consciousness: awake, alert , oriented and patient cooperative  Airway & Oxygen Therapy: Patient Spontanous Breathing and Patient connected to nasal cannula oxygen  Post-op Assessment: Report given to PACU RN, Post -op Vital signs reviewed and stable and Patient moving all extremities X 4  Post vital signs: Reviewed and stable  Complications: No apparent anesthesia complications

## 2011-08-26 NOTE — Anesthesia Postprocedure Evaluation (Signed)
Anesthesia Post Note  Patient: [name]  Procedure(s) Performed: Procedure(s) (LRB): THROMBECTOMY AND REVISION OF ARTERIOVENTOUS (AV) GORETEX  GRAFT (Right)  Anesthesia type: general  Patient location: PACU  Post pain: Pain level controlled  Post assessment: Patient's Cardiovascular Status Stable  Last Vitals:  Filed Vitals:   08/26/11 1731  BP: 129/77  Pulse: 74  Temp: 36.3 C  Resp: 14    Post vital signs: Reviewed and stable  Level of consciousness: sedated  Complications: No apparent anesthesia complications

## 2011-08-26 NOTE — Op Note (Addendum)
Procedure: Thrombectomy and revision of right thigh AV graft  Preoperative diagnosis: Thrombosed AV graft right thigh  Postoperative diagnosis: Same  Anesthesia: General  Assistant: Newton Pigg PA-C  Operative findings: Greater than 8 mm outflow vein, revised to 7 mm interposition graft  Operative details: After obtaining informed consent, the patient was taken to the operating room. The patient was placed in supine position on the operating room table. After adequate sedation, the patient's entire right lower extremity was prepped and draped in the usual sterile fashion. A scimitar shaped incision was placed in the right groin.   The incision was carried into the subcutaneous tissues down to level the graft. The graft was dissected free circumferentially. Dissection was carried down to the level of the venous anastomosis. The common femoral, superficial femoral and deep femoral veins were dissected free circumferentially and vessel loops placed around these.   This was fairly tedious due to dense adhesions and the pts obesity.  The patient was given 10,000 units of intravenous heparin. The venous limb of the graft was divided just above the level of the anastomosis. A #4 Fogarty catheter was used to thrombectomize the venous limb of the graft. There was some venous backbleeding but there was thick intimal hyperplasia within the vein and the vein was quite thickened. Therefore the vein was opened longitudinally past the segment intimal hyperplasia. The thickened portion of vein was debrided away. The arterial limb the graft was then thrombectomized with a #4 Fogarty catheter excellent arterial inflow was obtained. This was clamped proximally with a fistula clamp. A new 6 mm interposition graft was brought up in the operative field and sewn end-to-side to the vein using a running 6-0 Prolene suture. At completion of the anastomosis the venous limb was thoroughly flushed with heparinized saline and  reoccluded. The graft was cut to length in preparation for sewing to the proximal graft. This was then sewn end-to-end to the proximal arterial limb of the graft using running 6-0 Prolene suture. Just prior to completion, the anastomosis was forebled backbled and thoroughly flushed. The anastomosis was secured; clamps were released; and there was a palpable thrill in the graft immediately. Hemostasis was obtained with direct pressure. The subcutaneous tissues were reapproximated with running 2 0 and 3-0 Vicryl suture. The skin was closed with a 4 0 Vicryl subcuticular stitch. Dermabond was applied the incision. Two recent needle holes in the body of the graft were bleeding at this point and they were controlled with 4 0 Monocryl simple sutures.  The patient tolerated the procedure well and there were no complications. Instrument sponge and needle counts were correct at the end of the case. The patient was taken to the recovery room in stable condition.  Fabienne Bruns, MD [facility] [place] Office: [phone] Pager: [phone]

## 2011-08-26 NOTE — Progress Notes (Signed)
The pt has an order for a cane after discharge today.  Marie with advanced notified.

## 2011-08-26 NOTE — Anesthesia Preprocedure Evaluation (Signed)
Anesthesia Evaluation  Patient identified by MRN, date of birth, ID band Patient awake    Reviewed: Allergy & Precautions, H&P , NPO status , Patient's Chart, lab work & pertinent test results  Airway  TM Distance: >3 FB Neck ROM: Full    Dental  (+) Edentulous Upper, Edentulous Lower and Dental Advisory Given   Pulmonary  breath sounds clear to auscultation        Cardiovascular Rhythm:Regular Rate:Normal     Neuro/Psych    GI/Hepatic   Endo/Other  Morbid obesity  Renal/GU      Musculoskeletal   Abdominal   Peds  Hematology   Anesthesia Other Findings   Reproductive/Obstetrics                           Anesthesia Physical Anesthesia Plan  ASA: IV  Anesthesia Plan: General   Post-op Pain Management:    Induction: Intravenous  Airway Management Planned: LMA  Additional Equipment:   Intra-op Plan:   Post-operative Plan: Extubation in OR  Informed Consent: I have reviewed the patients History and Physical, chart, labs and discussed the procedure including the risks, benefits and alternatives for the proposed anesthesia with the patient or authorized representative who has indicated his/her understanding and acceptance.   Dental advisory given  Plan Discussed with: CRNA, Anesthesiologist and Surgeon  Anesthesia Plan Comments:         Anesthesia Quick Evaluation

## 2011-08-26 NOTE — Discharge Instructions (Signed)
    08/26/2011 [name] [id number] 01/08/1956  Surgeon(s): Sherren Kerns, MD  Procedure(s): THROMBECTOMY AND REVISION OF ARTERIOVENTOUS (AV) GORETEX  GRAFT  X May stick graft immediately

## 2011-08-26 NOTE — H&P (Signed)
[facility] [place]  History and Physical  HPI -Clotted right thigh graft, multiple prior thrombectomies  Past Medical History  Diagnosis Date  . HTN (hypertension)   . Anxiety   . Back pain   . GERD (gastroesophageal reflux disease)   . Peripheral neuropathy   . Arthritis   . CHF (congestive heart failure)   . Active smoker   . Dialysis patient     M-W-F  . Renal failure     MWF dialysis at [facility]  . Shortness of breath   . Diabetes mellitus     diet controlled  . 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 PONV (postoperative nausea and vomiting)     Past Surgical History  Procedure Date  . Total hip arthroplasty   . Thyroidectomy   . Tooth extraction   . Mandible fracture surgery   . Dg av dialysis graft declot or   . Insertion of dialysis catheter 03/28/2011    Procedure: INSERTION OF DIALYSIS CATHETER;  Surgeon: James D Lawson, MD;  Location: [facility] OR;  Service: Vascular;  Laterality: Left;  . Av fistula placement 03/31/2011    Procedure: INSERTION OF ARTERIOVENOUS (AV) GORE-TEX GRAFT THIGH;  Surgeon: Caryssa Elzey E Juron Vorhees, MD;  Location: [facility] OR;  Service: Vascular;  Laterality: Right;  redo right thigh arteriovenous gortex graft using gore-tex stretch 6mm x 70cm  . Thrombectomy w/ embolectomy 06/02/2011    Procedure: THROMBECTOMY ARTERIOVENOUS GORE-TEX GRAFT;  Surgeon: Christopher S Dickson, MD;  Location: [facility] OR;  Service: Vascular;  Laterality: Right;  Thrombectomy right thigh arteriovenous gortex graft;  revision by  replacement of large portion of graft with 7mm gore-tex    ROS: denies shortness of breath, denies chest pain  Exam: 157/98 57 97.8 F (36.6 C) (Oral) 18 98% No intake or output data in the 24 hours ending 08/26/11 1302  Clotted right thigh graft Obese  Assessment/Planning: Thrombectomy Revision right thigh graft   Deryl Ports E 08/26/2011 1:02 PM --  Laboratory Lab Results: No results found for this  basename: WBC:2,HGB:2,HCT:2,PLT:2 in the last 72 hours BMET  Basename 08/25/11 0922  NA --  K 5.8*  CL --  CO2 --  GLUCOSE --  BUN --  CREATININE --  CALCIUM --    COAG Lab Results  Component Value Date   INR 1.08 03/31/2011   INR 0.91 06/09/2010   INR 1.06 06/03/2010   No results found for this basename: PTT    Antibiotics Anti-infectives     Start     Dose/Rate Route Frequency Ordered Stop   08/26/11 1100   ceFAZolin (ANCEF) IVPB 1 g/50 mL premix  Status:  Discontinued        1 g 100 mL/hr over 30 Minutes Intravenous  Once 08/26/11 1048 08/26/11 1050   08/26/11 1050   ceFAZolin (ANCEF) IVPB 2 g/50 mL premix        2 g 100 mL/hr over 30 Minutes Intravenous 60 min pre-op 08/26/11 1051               

## 2011-08-27 ENCOUNTER — Telehealth: Payer: Self-pay | Admitting: Vascular Surgery

## 2011-08-27 NOTE — Telephone Encounter (Signed)
Message copied by Sara Chu on Thu Aug 27, 2011  9:43 AM ------      Message from: [place], [place] K      Created: Thu Aug 27, 2011  8:33 AM      Regarding: schedule                   ----- Message -----         From: Dara Lords, PA         Sent: 08/26/2011   4:35 PM           To: Sharee Pimple, CMA            [name], 56 y.o., Apr 02, 1957MRN: [id number]            S/p thrombectomy and revision of right thigh graft.            F/u with [facility] in 2 weeks.            Thanks!            Lelon Mast

## 2011-08-27 NOTE — Telephone Encounter (Signed)
Patient notified of appointment via telephone call. Mailed letter to home address also regarding scheduled appointment.   

## 2011-08-27 NOTE — Telephone Encounter (Signed)
Spoke with patients daughter to confirm appt date/time

## 2011-08-27 NOTE — Telephone Encounter (Signed)
Message copied by Fredrich Birks on Thu Aug 27, 2011  1:31 PM ------      Message from: Melene Plan      Created: Thu Aug 27, 2011 12:46 PM                   ----- Message -----         From: Sherren Kerns, MD         Sent: 08/26/2011   4:44 PM           To: Almetta Lovely, RN, #            Thrombectomy and Revision right thigh graft      [place] asst.      Needs follow up in 2 weeks            Leonette Most

## 2011-08-28 ENCOUNTER — Encounter (HOSPITAL_COMMUNITY): Payer: Self-pay | Admitting: Anesthesiology

## 2011-08-28 ENCOUNTER — Ambulatory Visit (HOSPITAL_COMMUNITY)
Admission: AD | Admit: 2011-08-28 | Discharge: 2011-08-28 | Disposition: A | Discharge status: Home / Self Care | Payer: Medicare Other | Source: Ambulatory Visit | Attending: Vascular Surgery | Admitting: Vascular Surgery

## 2011-08-28 ENCOUNTER — Ambulatory Visit (HOSPITAL_COMMUNITY): Payer: Medicare Other

## 2011-08-28 ENCOUNTER — Encounter (HOSPITAL_COMMUNITY): Payer: Self-pay | Admitting: Pharmacy Technician

## 2011-08-28 ENCOUNTER — Other Ambulatory Visit: Payer: Self-pay

## 2011-08-28 ENCOUNTER — Ambulatory Visit (HOSPITAL_COMMUNITY): Payer: Medicare Other | Admitting: Anesthesiology

## 2011-08-28 ENCOUNTER — Encounter (HOSPITAL_COMMUNITY)
Admission: AD | Disposition: A | Discharge status: Home / Self Care | Payer: Self-pay | Source: Ambulatory Visit | Attending: Vascular Surgery

## 2011-08-28 DIAGNOSIS — N186 End stage renal disease: Secondary | ICD-10-CM

## 2011-08-28 DIAGNOSIS — K219 Gastro-esophageal reflux disease without esophagitis: Secondary | ICD-10-CM | POA: Insufficient documentation

## 2011-08-28 DIAGNOSIS — E1149 Type 2 diabetes mellitus with other diabetic neurological complication: Secondary | ICD-10-CM | POA: Insufficient documentation

## 2011-08-28 DIAGNOSIS — Z992 Dependence on renal dialysis: Secondary | ICD-10-CM | POA: Insufficient documentation

## 2011-08-28 DIAGNOSIS — I509 Heart failure, unspecified: Secondary | ICD-10-CM | POA: Insufficient documentation

## 2011-08-28 DIAGNOSIS — I120 Hypertensive chronic kidney disease with stage 5 chronic kidney disease or end stage renal disease: Secondary | ICD-10-CM | POA: Insufficient documentation

## 2011-08-28 DIAGNOSIS — E1142 Type 2 diabetes mellitus with diabetic polyneuropathy: Secondary | ICD-10-CM | POA: Insufficient documentation

## 2011-08-28 HISTORY — PX: INSERTION OF DIALYSIS CATHETER: SHX1324

## 2011-08-28 LAB — POCT I-STAT, CHEM 8
Calcium, Ion: 0.76 mmol/L — ABNORMAL LOW (ref 1.12–1.32)
Chloride: 108 mEq/L (ref 96–112)
Glucose, Bld: 82 mg/dL (ref 70–99)
HCT: 31 % — ABNORMAL LOW (ref 39.0–52.0)
TCO2: 23 mmol/L (ref 0–100)

## 2011-08-28 LAB — GLUCOSE, CAPILLARY: Glucose-Capillary: 76 mg/dL (ref 70–99)

## 2011-08-28 SURGERY — INSERTION OF DIALYSIS CATHETER
Anesthesia: General | Site: Thigh | Laterality: Left | Wound class: Clean

## 2011-08-28 MED ORDER — CEFAZOLIN SODIUM 1-5 GM-% IV SOLN
INTRAVENOUS | Status: DC | PRN
  Administered 2011-08-28: 2 g via INTRAVENOUS

## 2011-08-28 MED ORDER — PROPOFOL 10 MG/ML IV BOLUS
INTRAVENOUS | Status: DC | PRN
  Administered 2011-08-28: 175 mg via INTRAVENOUS

## 2011-08-28 MED ORDER — SODIUM CHLORIDE 0.9 % IR SOLN
Status: DC | PRN
  Administered 2011-08-28: 16:00:00

## 2011-08-28 MED ORDER — OXYCODONE HCL 5 MG PO TABS: 5.0000 mg | ORAL_TABLET | ORAL | Status: AC | PRN

## 2011-08-28 MED ORDER — FENTANYL CITRATE 0.05 MG/ML IJ SOLN: 25.0000 ug | INTRAMUSCULAR | Status: DC | PRN

## 2011-08-28 MED ORDER — FENTANYL CITRATE 0.05 MG/ML IJ SOLN
INTRAMUSCULAR | Status: DC | PRN
  Administered 2011-08-28: 50 ug via INTRAVENOUS

## 2011-08-28 MED ORDER — LIDOCAINE-EPINEPHRINE (PF) 1 %-1:200000 IJ SOLN
INTRAMUSCULAR | Status: DC | PRN
  Administered 2011-08-28: 10 mL

## 2011-08-28 MED ORDER — IOHEXOL 300 MG/ML  SOLN: INTRAMUSCULAR | Status: DC | PRN

## 2011-08-28 MED ORDER — SODIUM CHLORIDE 0.9 % IR SOLN
Status: DC | PRN
  Administered 2011-08-28: 1000 mL

## 2011-08-28 MED ORDER — MIDAZOLAM HCL 5 MG/5ML IJ SOLN
INTRAMUSCULAR | Status: DC | PRN
  Administered 2011-08-28: 2 mg via INTRAVENOUS

## 2011-08-28 MED ORDER — SODIUM CHLORIDE 0.9 % IV SOLN
INTRAVENOUS | Status: DC
  Administered 2011-08-28: 15:00:00 via INTRAVENOUS

## 2011-08-28 MED ORDER — ONDANSETRON HCL 4 MG/2ML IJ SOLN
4.0000 mg | Freq: Four times a day (QID) | INTRAMUSCULAR | Status: DC | PRN
Start: 2011-08-28 — End: 2011-08-28

## 2011-08-28 MED ORDER — SODIUM CHLORIDE 0.9 % IV SOLN
INTRAVENOUS | Status: DC | PRN
  Administered 2011-08-28: 15:00:00 via INTRAVENOUS

## 2011-08-28 MED ORDER — CEFAZOLIN SODIUM 1-5 GM-% IV SOLN
INTRAVENOUS | Status: AC
  Filled 2011-08-28: qty 100

## 2011-08-28 MED ORDER — HEPARIN SODIUM (PORCINE) 1000 UNIT/ML IJ SOLN
INTRAMUSCULAR | Status: DC | PRN
  Administered 2011-08-28: 7 mL

## 2011-08-28 SURGICAL SUPPLY — 45 items
BAG DECANTER FOR FLEXI CONT (MISCELLANEOUS) ×2 IMPLANT
CATH CANNON HEMO 15F 50CM (CATHETERS) ×2 IMPLANT
CATH CANNON HEMO 15FR 19 (HEMODIALYSIS SUPPLIES) IMPLANT
CATH CANNON HEMO 15FR 23CM (HEMODIALYSIS SUPPLIES) ×2 IMPLANT
CATH CANNON HEMO 15FR 31CM (HEMODIALYSIS SUPPLIES) IMPLANT
CATH CANNON HEMO 15FR 32CM (HEMODIALYSIS SUPPLIES) IMPLANT
CLOTH BEACON ORANGE TIMEOUT ST (SAFETY) ×2 IMPLANT
COVER PROBE W GEL 5X96 (DRAPES) ×2 IMPLANT
COVER SURGICAL LIGHT HANDLE (MISCELLANEOUS) ×2 IMPLANT
DERMABOND ADVANCED (GAUZE/BANDAGES/DRESSINGS) ×1
DERMABOND ADVANCED .7 DNX12 (GAUZE/BANDAGES/DRESSINGS) ×1 IMPLANT
DRAPE C-ARM 42X72 X-RAY (DRAPES) ×2 IMPLANT
DRAPE CHEST BREAST 15X10 FENES (DRAPES) ×4 IMPLANT
GAUZE SPONGE 2X2 8PLY STRL LF (GAUZE/BANDAGES/DRESSINGS) ×1 IMPLANT
GAUZE SPONGE 4X4 16PLY XRAY LF (GAUZE/BANDAGES/DRESSINGS) ×2 IMPLANT
GLOVE BIO SURGEON STRL SZ7 (GLOVE) ×2 IMPLANT
GLOVE BIOGEL PI IND STRL 6.5 (GLOVE) ×2 IMPLANT
GLOVE BIOGEL PI IND STRL 7.0 (GLOVE) ×2 IMPLANT
GLOVE BIOGEL PI IND STRL 7.5 (GLOVE) ×1 IMPLANT
GLOVE BIOGEL PI INDICATOR 6.5 (GLOVE) ×2
GLOVE BIOGEL PI INDICATOR 7.0 (GLOVE) ×2
GLOVE BIOGEL PI INDICATOR 7.5 (GLOVE) ×1
GOWN STRL NON-REIN LRG LVL3 (GOWN DISPOSABLE) ×4 IMPLANT
KIT BASIN OR (CUSTOM PROCEDURE TRAY) ×2 IMPLANT
KIT ROOM TURNOVER OR (KITS) ×2 IMPLANT
NEEDLE 18GX1X1/2 (RX/OR ONLY) (NEEDLE) ×2 IMPLANT
NEEDLE HYPO 25GX1X1/2 BEV (NEEDLE) ×2 IMPLANT
NS IRRIG 1000ML POUR BTL (IV SOLUTION) ×2 IMPLANT
PACK SURGICAL SETUP 50X90 (CUSTOM PROCEDURE TRAY) ×2 IMPLANT
PAD ARMBOARD 7.5X6 YLW CONV (MISCELLANEOUS) ×4 IMPLANT
SHEATH AVANTI 11CM 5FR (MISCELLANEOUS) ×2 IMPLANT
SOAP 2 % CHG 4 OZ (WOUND CARE) ×2 IMPLANT
SPONGE GAUZE 2X2 STER 10/PKG (GAUZE/BANDAGES/DRESSINGS) ×1
SUT ETHILON 3 0 PS 1 (SUTURE) ×2 IMPLANT
SUT MNCRL AB 4-0 PS2 18 (SUTURE) ×2 IMPLANT
SYR 20CC LL (SYRINGE) ×4 IMPLANT
SYR 30ML LL (SYRINGE) IMPLANT
SYR 3ML LL SCALE MARK (SYRINGE) ×2 IMPLANT
SYR 5ML LL (SYRINGE) ×2 IMPLANT
SYR CONTROL 10ML LL (SYRINGE) ×2 IMPLANT
SYRINGE 10CC LL (SYRINGE) ×2 IMPLANT
TAPE CLOTH SURG 4X10 WHT LF (GAUZE/BANDAGES/DRESSINGS) ×2 IMPLANT
TOWEL OR 17X24 6PK STRL BLUE (TOWEL DISPOSABLE) ×2 IMPLANT
TOWEL OR 17X26 10 PK STRL BLUE (TOWEL DISPOSABLE) ×2 IMPLANT
WATER STERILE IRR 1000ML POUR (IV SOLUTION) IMPLANT

## 2011-08-28 NOTE — Anesthesia Postprocedure Evaluation (Signed)
  Anesthesia Post-op Note  Patient: [name]  Procedure(s) Performed: Procedure(s) (LRB): INSERTION OF DIALYSIS CATHETER (Left)  Patient Location: PACU  Anesthesia Type: General  Level of Consciousness: awake and alert   Airway and Oxygen Therapy: Patient Spontanous Breathing and Patient connected to nasal cannula oxygen  Post-op Pain: mild  Post-op Assessment: Post-op Vital signs reviewed  Post-op Vital Signs: Reviewed  Complications: No apparent anesthesia complications

## 2011-08-28 NOTE — Transfer of Care (Signed)
Immediate Anesthesia Transfer of Care Note  Patient: [name]  Procedure(s) Performed: Procedure(s) (LRB): INSERTION OF DIALYSIS CATHETER (Left)  Patient Location: PACU  Anesthesia Type: General  Level of Consciousness: sedated and patient cooperative  Airway & Oxygen Therapy: Patient connected to nasal cannula oxygen  Post-op Assessment: Report given to PACU RN, Post -op Vital signs reviewed and stable and Patient moving all extremities X 4  Post vital signs: Reviewed and stable  Complications: No apparent anesthesia complications

## 2011-08-28 NOTE — Interval H&P Note (Signed)
[facility] [place]  History and Physical Update  The patient was interviewed and re-examined.  The patient's previous History and Physical has been reviewed and is unchanged except for: rethrombosis of right thigh graft after thrombectomy and revision.  There is no change in the plan of care.  Leonides Sake, MD [facility] [place] Office: [phone] Pager: [phone]  08/28/2011, 2:26 PM

## 2011-08-28 NOTE — Anesthesia Procedure Notes (Signed)
Procedure Name: LMA Insertion Date/Time: 08/28/2011 3:20 PM Performed by: Molli Hazard Pre-anesthesia Checklist: Patient identified, Emergency Drugs available, Suction available and Patient being monitored Patient Re-evaluated:Patient Re-evaluated prior to inductionOxygen Delivery Method: Circle system utilized Preoxygenation: Pre-oxygenation with 100% oxygen Intubation Type: IV induction LMA: LMA with gastric port inserted LMA Size: 5.0 Number of attempts: 1 Placement Confirmation: positive ETCO2 Tube secured with: Tape Dental Injury: Teeth and Oropharynx as per pre-operative assessment

## 2011-08-28 NOTE — H&P (View-Only) (Signed)
[facility] [place]  History and Physical  HPI -Clotted right thigh graft, multiple prior thrombectomies  Past Medical History  Diagnosis Date  . HTN (hypertension)   . Anxiety   . Back pain   . GERD (gastroesophageal reflux disease)   . Peripheral neuropathy   . Arthritis   . CHF (congestive heart failure)   . Active smoker   . Dialysis patient     M-W-F  . Renal failure     MWF dialysis at [facility]  . Shortness of breath   . Diabetes mellitus     diet controlled  . 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 PONV (postoperative nausea and vomiting)     Past Surgical History  Procedure Date  . Total hip arthroplasty   . Thyroidectomy   . Tooth extraction   . Mandible fracture surgery   . Dg av dialysis graft declot or   . Insertion of dialysis catheter 03/28/2011    Procedure: INSERTION OF DIALYSIS CATHETER;  Surgeon: Pryor Ochoa, MD;  Location: [facility] OR;  Service: Vascular;  Laterality: Left;  . Av fistula placement 03/31/2011    Procedure: INSERTION OF ARTERIOVENOUS (AV) GORE-TEX GRAFT THIGH;  Surgeon: Sherren Kerns, MD;  Location: [facility] OR;  Service: Vascular;  Laterality: Right;  redo right thigh arteriovenous gortex graft using gore-tex stretch 6mm x 70cm  . Thrombectomy w/ embolectomy 06/02/2011    Procedure: THROMBECTOMY ARTERIOVENOUS GORE-TEX GRAFT;  Surgeon: Chuck Hint, MD;  Location: [facility] OR;  Service: Vascular;  Laterality: Right;  Thrombectomy right thigh arteriovenous gortex graft;  revision by  replacement of large portion of graft with 7mm gore-tex    ROS: denies shortness of breath, denies chest pain  Exam: 157/98 57 97.8 F (36.6 C) (Oral) 18 98% No intake or output data in the 24 hours ending 08/26/11 1302  Clotted right thigh graft Obese  Assessment/Planning: Thrombectomy Revision right thigh graft   Iviana Blasingame E 08/26/2011 1:02 PM --  Laboratory Lab Results: No results found for this  basename: WBC:2,HGB:2,HCT:2,PLT:2 in the last 72 hours BMET  [facility] 08/25/11 0922  NA --  K 5.8*  CL --  CO2 --  GLUCOSE --  BUN --  CREATININE --  CALCIUM --    COAG Lab Results  Component Value Date   INR 1.08 03/31/2011   INR 0.91 06/09/2010   INR 1.06 06/03/2010   No results found for this basename: PTT    Antibiotics Anti-infectives     Start     Dose/Rate Route Frequency Ordered Stop   08/26/11 1100   ceFAZolin (ANCEF) IVPB 1 g/50 mL premix  Status:  Discontinued        1 g 100 mL/hr over 30 Minutes Intravenous  Once 08/26/11 1048 08/26/11 1050   08/26/11 1050   ceFAZolin (ANCEF) IVPB 2 g/50 mL premix        2 g 100 mL/hr over 30 Minutes Intravenous 60 min pre-op 08/26/11 1051

## 2011-08-28 NOTE — Op Note (Signed)
OPERATIVE NOTE  PROCEDURE: 1. Left femoral vein tunneled dialysis catheter placement 2. Left femoral vein cannulation under ultrasound guidance 3. Failed bilateral internal jugular vein and subclavian vein cannulation  PRE-OPERATIVE DIAGNOSIS: end-stage renal failure  POST-OPERATIVE DIAGNOSIS: same as above  SURGEON: Leonides Sake, MD  ANESTHESIA: general  ESTIMATED BLOOD LOSS: minimal  FINDING(S): 1.  Tips of the catheter in the right atrium on fluoroscopy  SPECIMEN(S):  none  INDICATIONS:   [name] is a 56 y.o. male who presents with thrombosed right thigh arteriovenous graft.  The patient underwent thrombectomy and revision of this right thigh arteriovenous graft two days ago.  The patient presents for tunneled dialysis catheter placement.  The patient is aware the risks of tunneled dialysis catheter placement include but are not limited to: bleeding, infection, central venous injury, pneumothorax, possible venous stenosis, possible malpositioning in the venous system, and possible infections related to long-term catheter presence. The patient was aware of these risks and agreed to proceed.  DESCRIPTION: After written full informed consent was obtained from the patient, the patient was taken back to the operating room.  Prior to induction, the patient was given IV antibiotics.  After obtaining adequate sedation, the patient was prepped and draped in the standard fashion for a neck or chest tunneled dialysis catheter placement.  Attempts was made to cannulate bilateral internal jugular and subclavian veins, with any success passing the wire centrally.  This suggests possible superior vena cava stenosis or occlusion.  At this point, the patient was reprepped and redraped in the standard fashion for a femoral vein tunneled dialysis catheter placement.  I anesthesized the groin cannulation site with local anesthetic, then under ultrasound guidance, the left femoral vein vein was  cannulated with the 18 gauge needle.  A J-wire was then placed into the IVC under fluroscopic guidance.  The wire was then secured in place with a clamp to the drapes.  The cannulation site, the catheter exit site, and tract for the subcutaneous tunnel were then anesthesized with a total of 10 cc of a 1:1 mixture of 0.5% Marcaine without epinepherine and 1% Lidocaine with epinepherine.  I then made stab incisions are the cannulation and exit sites.  I dissected from the exit site to the cannulation site and dilated the subcutaneous tunnel with a plastic dilator.  The wire was then unclamped and I removed the needle.  The skin tract and venotomy was dilated serially with dilators.  Finally, the dilator-sheath was placed under fluroscopic guidance into the left iliac vein.  The dilator and wire were removed.  A 55 cm Diatek catheter was placed under fluoroscopic guidance down into the right atrium.  The sheath was broken and peeled away while holding the catheter cuff at the level of the skin.  The back end of this catheter was transected, revealing the two lumens of this catheter.  The ports were docked onto these two lumens.  The catheter hub was then screwed into place.  Each port was tested by aspirating and flushing.  No resistance was noted.  Each port was then thoroughly flushed with heparinized saline.  The catheter was secured in placed with two interrupted stitches of 3-0 Nylon tied to the catheter.  The neck incision was closed with a U-stitch of 4-0 Monocryl.  The neck and chest incision were cleaned and sterile bandages applied.  Each port was then loaded with concentrated heparin (1000 Units/mL) at the manufacturer recommended volumes to each port.  Sterile caps were applied  to each port.  On completion fluoroscopy, the tips of the catheter were in the right atrium, and there was no evidence of pneumothorax.  COMPLICATIONS: none  CONDITION: stable   [name] Simmer, MD 08/28/2011 4:33  PM

## 2011-08-28 NOTE — Preoperative (Signed)
Beta Blockers   Reason not to administer Beta Blockers:Not Applicable 

## 2011-08-28 NOTE — Anesthesia Preprocedure Evaluation (Signed)
Anesthesia Evaluation  Patient identified by MRN, date of birth, ID band Patient awake    Reviewed: Allergy & Precautions, H&P , NPO status , Patient's Chart, lab work & pertinent test results  History of Anesthesia Complications (+) PONV  Airway Mallampati: II  Neck ROM: full    Dental   Pulmonary shortness of breath, Current Smoker,          Cardiovascular hypertension, +CHF     Neuro/Psych Anxiety  Neuromuscular disease    GI/Hepatic GERD-  ,(+) Hepatitis -, B  Endo/Other  Diabetes mellitus-  Renal/GU      Musculoskeletal   Abdominal   Peds  Hematology   Anesthesia Other Findings   Reproductive/Obstetrics                           Anesthesia Physical Anesthesia Plan  ASA: IV  Anesthesia Plan: General   Post-op Pain Management:    Induction: Intravenous  Airway Management Planned: LMA  Additional Equipment:   Intra-op Plan:   Post-operative Plan:   Informed Consent: I have reviewed the patients History and Physical, chart, labs and discussed the procedure including the risks, benefits and alternatives for the proposed anesthesia with the patient or authorized representative who has indicated his/her understanding and acceptance.     Plan Discussed with: CRNA and Surgeon  Anesthesia Plan Comments:         Anesthesia Quick Evaluation

## 2011-09-01 ENCOUNTER — Encounter (HOSPITAL_COMMUNITY): Payer: Self-pay | Admitting: Vascular Surgery

## 2011-09-10 ENCOUNTER — Ambulatory Visit: Payer: Medicare Other | Admitting: Vascular Surgery

## 2011-09-23 ENCOUNTER — Encounter: Payer: Self-pay | Admitting: Vascular Surgery

## 2011-09-24 ENCOUNTER — Encounter: Payer: Self-pay | Admitting: Vascular Surgery

## 2011-09-24 ENCOUNTER — Ambulatory Visit (INDEPENDENT_AMBULATORY_CARE_PROVIDER_SITE_OTHER): Payer: Medicare Other | Admitting: Vascular Surgery

## 2011-09-24 VITALS — BP 124/73 | HR 75 | Resp 16 | Ht 71.0 in | Wt 292.8 lb

## 2011-09-24 DIAGNOSIS — N186 End stage renal disease: Secondary | ICD-10-CM

## 2011-09-24 NOTE — Progress Notes (Signed)
Patient is a 43 her old male returns for followup today. He underwent from ectomy and revision of a right thigh AV graft on May 1. This subsequently occluded 48 hours later. He currently is dialyzing via left femoral Diatek catheter. He presents today to recheck the incision in his right groin as well as consideration for a new access. He has had multiple prior upper extremity grafts which have all failed. His right thigh graft has now failed.  Physical exam:  Filed Vitals:   09/24/11 0936  BP: 124/73  Pulse: 75  Resp: 16  Height: 5\' 11"  (1.803 m)  Weight: 292 lb 12.8 oz (132.813 kg)  SpO2: 100%    Right groin small amount of serous drainage with some separation at the upper aspect and lower aspect of the the incision. No erythema  Left groin Diatek no drainage or erythema  Assessment: Healing right groin wound but will need to recheck this in a few weeks to make sure heals to completion  Patient is running out of access options. We will schedule him for a central venogram of both upper extremities to see if he may be a candidate for a HERO graft. If not we would need to consider moving his Diatek catheter to a different vein location so that we could consider a left thigh graft  He will followup with me after his central venogram  Fabienne Bruns, MD [facility] [place] Office: [phone] Pager: [phone]

## 2011-09-25 ENCOUNTER — Other Ambulatory Visit: Payer: Self-pay

## 2011-10-05 ENCOUNTER — Ambulatory Visit (HOSPITAL_COMMUNITY): Admission: RE | Admit: 2011-10-05 | Payer: Medicare Other | Source: Ambulatory Visit | Admitting: Surgery

## 2011-10-05 MED ORDER — CEFAZOLIN SODIUM-DEXTROSE 2-3 GM-% IV SOLR
2.0000 g | Freq: Once | INTRAVENOUS | Status: DC
  Filled 2011-10-05: qty 50

## 2011-10-05 MED ORDER — SODIUM CHLORIDE 0.9 % IJ SOLN: 3.0000 mL | INTRAMUSCULAR | Status: DC | PRN

## 2011-10-05 MED ORDER — SODIUM CHLORIDE 0.9 % IV SOLN: INTRAVENOUS | Status: DC

## 2011-10-06 ENCOUNTER — Encounter (HOSPITAL_COMMUNITY): Admission: RE | Payer: Self-pay | Source: Ambulatory Visit

## 2011-10-06 SURGERY — VENOGRAM
Anesthesia: LOCAL

## 2011-10-12 ENCOUNTER — Other Ambulatory Visit: Payer: Self-pay

## 2011-10-20 ENCOUNTER — Ambulatory Visit (HOSPITAL_COMMUNITY)
Admission: RE | Admit: 2011-10-20 | Discharge: 2011-10-20 | Disposition: A | Discharge status: Other Acute Inpatient Hospital | Payer: Medicare Other | Source: Ambulatory Visit | Attending: Surgery | Admitting: Surgery

## 2011-10-20 ENCOUNTER — Emergency Department (HOSPITAL_COMMUNITY)
Admission: EM | Admit: 2011-10-20 | Discharge: 2011-10-20 | Disposition: A | Discharge status: Home / Self Care | Payer: Medicare Other | Attending: Emergency Medicine | Admitting: Emergency Medicine

## 2011-10-20 ENCOUNTER — Encounter (HOSPITAL_COMMUNITY): Payer: Self-pay | Admitting: *Deleted

## 2011-10-20 ENCOUNTER — Emergency Department (HOSPITAL_COMMUNITY): Payer: Medicare Other

## 2011-10-20 ENCOUNTER — Encounter (HOSPITAL_COMMUNITY): Admission: RE | Payer: Self-pay | Source: Ambulatory Visit | Attending: Surgery

## 2011-10-20 DIAGNOSIS — Z5309 Procedure and treatment not carried out because of other contraindication: Secondary | ICD-10-CM | POA: Insufficient documentation

## 2011-10-20 DIAGNOSIS — Z992 Dependence on renal dialysis: Secondary | ICD-10-CM | POA: Insufficient documentation

## 2011-10-20 DIAGNOSIS — R109 Unspecified abdominal pain: Secondary | ICD-10-CM | POA: Insufficient documentation

## 2011-10-20 DIAGNOSIS — Z7982 Long term (current) use of aspirin: Secondary | ICD-10-CM | POA: Insufficient documentation

## 2011-10-20 DIAGNOSIS — I120 Hypertensive chronic kidney disease with stage 5 chronic kidney disease or end stage renal disease: Secondary | ICD-10-CM | POA: Insufficient documentation

## 2011-10-20 DIAGNOSIS — N186 End stage renal disease: Secondary | ICD-10-CM | POA: Insufficient documentation

## 2011-10-20 DIAGNOSIS — F411 Generalized anxiety disorder: Secondary | ICD-10-CM | POA: Insufficient documentation

## 2011-10-20 DIAGNOSIS — R1110 Vomiting, unspecified: Secondary | ICD-10-CM | POA: Insufficient documentation

## 2011-10-20 DIAGNOSIS — Z96649 Presence of unspecified artificial hip joint: Secondary | ICD-10-CM | POA: Insufficient documentation

## 2011-10-20 DIAGNOSIS — M129 Arthropathy, unspecified: Secondary | ICD-10-CM | POA: Insufficient documentation

## 2011-10-20 DIAGNOSIS — Z4901 Encounter for fitting and adjustment of extracorporeal dialysis catheter: Secondary | ICD-10-CM | POA: Insufficient documentation

## 2011-10-20 DIAGNOSIS — R112 Nausea with vomiting, unspecified: Secondary | ICD-10-CM | POA: Insufficient documentation

## 2011-10-20 DIAGNOSIS — K219 Gastro-esophageal reflux disease without esophagitis: Secondary | ICD-10-CM | POA: Insufficient documentation

## 2011-10-20 DIAGNOSIS — Z79899 Other long term (current) drug therapy: Secondary | ICD-10-CM | POA: Insufficient documentation

## 2011-10-20 DIAGNOSIS — G609 Hereditary and idiopathic neuropathy, unspecified: Secondary | ICD-10-CM | POA: Insufficient documentation

## 2011-10-20 DIAGNOSIS — F17200 Nicotine dependence, unspecified, uncomplicated: Secondary | ICD-10-CM | POA: Insufficient documentation

## 2011-10-20 DIAGNOSIS — E119 Type 2 diabetes mellitus without complications: Secondary | ICD-10-CM | POA: Insufficient documentation

## 2011-10-20 DIAGNOSIS — I509 Heart failure, unspecified: Secondary | ICD-10-CM | POA: Insufficient documentation

## 2011-10-20 LAB — COMPREHENSIVE METABOLIC PANEL
AST: 101 U/L — ABNORMAL HIGH (ref 0–37)
Albumin: 3.5 g/dL (ref 3.5–5.2)
Calcium: 8.9 mg/dL (ref 8.4–10.5)
Chloride: 95 mEq/L — ABNORMAL LOW (ref 96–112)
Creatinine, Ser: 8.75 mg/dL — ABNORMAL HIGH (ref 0.50–1.35)
Sodium: 134 mEq/L — ABNORMAL LOW (ref 135–145)
Total Bilirubin: 1.7 mg/dL — ABNORMAL HIGH (ref 0.3–1.2)

## 2011-10-20 LAB — DIFFERENTIAL
Basophils Absolute: 0 10*3/uL (ref 0.0–0.1)
Basophils Relative: 0 % (ref 0–1)
Monocytes Absolute: 0.3 10*3/uL (ref 0.1–1.0)
Neutro Abs: 21.4 10*3/uL — ABNORMAL HIGH (ref 1.7–7.7)

## 2011-10-20 LAB — CBC
HCT: 38.8 % — ABNORMAL LOW (ref 39.0–52.0)
MCHC: 31.4 g/dL (ref 30.0–36.0)
Platelets: 263 10*3/uL (ref 150–400)
RDW: 17.7 % — ABNORMAL HIGH (ref 11.5–15.5)

## 2011-10-20 SURGERY — BILATERAL UPPER EXTREMITY ANGIOGRAM
Anesthesia: LOCAL

## 2011-10-20 MED ORDER — ONDANSETRON 4 MG PO TBDP
8.0000 mg | ORAL_TABLET | Freq: Once | ORAL | Status: AC
  Administered 2011-10-20: 8 mg via ORAL

## 2011-10-20 MED ORDER — OXYCODONE-ACETAMINOPHEN 5-325 MG PO TABS: 1.0000 | ORAL_TABLET | ORAL | Status: AC | PRN

## 2011-10-20 MED ORDER — ONDANSETRON HCL 8 MG PO TABS: 8.0000 mg | ORAL_TABLET | Freq: Three times a day (TID) | ORAL | Status: AC | PRN

## 2011-10-20 MED ORDER — ONDANSETRON 4 MG PO TBDP
ORAL_TABLET | ORAL | Status: AC
  Administered 2011-10-20: 8 mg via ORAL
  Filled 2011-10-20: qty 2

## 2011-10-20 MED ORDER — HYDROMORPHONE HCL PF 1 MG/ML IJ SOLN
2.0000 mg | Freq: Once | INTRAMUSCULAR | Status: AC
  Administered 2011-10-20: 2 mg via INTRAMUSCULAR
  Filled 2011-10-20: qty 2

## 2011-10-20 MED ORDER — ONDANSETRON HCL 8 MG PO TABS
ORAL_TABLET | ORAL | Status: AC
  Filled 2011-10-20: qty 1

## 2011-10-20 MED ORDER — ONDANSETRON HCL 4 MG/2ML IJ SOLN: 8.0000 mg | Freq: Once | INTRAMUSCULAR | Status: DC

## 2011-10-20 NOTE — ED Notes (Signed)
Patient transported to X-ray 

## 2011-10-20 NOTE — Discharge Instructions (Signed)
Used a Facilities manager to find a primary care Dr., to help you with your back, pain.     B.R.A.T. Diet Your doctor has recommended the B.R.A.T. diet for you or your child until the condition improves. This is often used to help control diarrhea and vomiting symptoms. If you or your child can tolerate clear liquids, you may have:  Bananas.   Rice.   Applesauce.   Toast (and other simple starches such as crackers, potatoes, noodles).  Be sure to avoid dairy products, meats, and fatty foods until symptoms are better. Fruit juices such as apple, grape, and prune juice can make diarrhea worse. Avoid these. Continue this diet for 2 days or as instructed by your caregiver. Document Released: 04/13/2005 Document Revised: 04/02/2011 Document Reviewed: 09/30/2006 [facility] Patient Information 2012 [place], [place].Clear Liquid Diet The clear liquid dietconsists of foods that are liquid or will become liquid at room temperature.You should be able to see through the liquid and beverages. Examples of foods allowed on a clear liquid diet include fruit juice, broth or bouillon, gelatin, or frozen ice pops. The purpose of this diet is to provide necessary fluid, electrolytes such as sodium and potassium, and energy to keep the body functioning during times when you are not able to consume a regular diet.A clear liquid diet should not be continued for long periods of time as it is not nutritionally adequate.  REASONS FOR USING A CLEAR LIQUID DIET  In sudden onset (acute) conditions for a patient before or after surgery.   As the first step in oral feeding.   For fluid and electrolyte replacement in diarrheal diseases.   As a diet before certain medical tests are performed.  ADEQUACY The clear liquid diet is adequate only in ascorbic acid, according to the Recommended Dietary Allowances of the [employer]. CHOOSING FOODS Breads and Starches  Allowed:  None are allowed.   Avoid: All  are avoided.  Vegetables  Allowed:  Strained tomato or vegetable juice.   Avoid: Any others.  Fruit  Allowed:  Strained fruit juices and fruit drinks. Include 1 serving of citrus or vitamin C-enriched fruit juice daily.   Avoid: Any others.  Meat and Meat Substitutes  Allowed:  None are allowed.   Avoid: All are avoided.  Milk  Allowed:  None are allowed.   Avoid: All are avoided.  Soups and Combination Foods  Allowed:  Clear bouillon, broth, or strained broth-based soups.   Avoid: Any others.  Desserts and Sweets  Allowed:  Sugar, honey. High protein gelatin. Flavored gelatin, ices, or frozen ice pops that do not contain milk.   Avoid: Any others.  Fats and Oils  Allowed:  None are allowed.   Avoid: All are avoided.  Beverages  Allowed: Cereal beverages, coffee (regular or decaffeinated), tea, or soda at the discretion of your caregiver.   Avoid: Any others.  Condiments  Allowed:  Iodized salt.   Avoid: Any others, including pepper.  Supplements  Allowed:  Liquid nutrition beverages.   Avoid: Any others that contain lactose or fiber.  SAMPLE MEAL PLAN Breakfast  4 oz (120 mL) strained orange juice.    to 1 cup (125 to 250 mL) gelatin (plain or fortified).   1 cup (250 mL) beverage (coffee or tea).   Sugar, if desired.  Midmorning Snack   cup (125 mL) gelatin (plain or fortified).  Lunch  1 cup (250 mL) broth or consomm.   4 oz (120 mL) strained grapefruit juice.  cup (125 mL) gelatin (plain or fortified).   1 cup (250 mL) beverage (coffee or tea).   Sugar, if desired.  Midafternoon Snack   cup (125 mL) fruit ice.    cup (125 mL) strained fruit juice.  Dinner  1 cup (250 mL) broth or consomm.    cup (125 mL) cranberry juice.    cup (125 mL) flavored gelatin (plain or fortified).   1 cup (250 mL) beverage (coffee or tea).   Sugar, if desired.  Evening Snack  4 oz (120 mL) strained apple juice (vitamin C-fortified).     cup (125 mL) flavored gelatin (plain or fortified).  Document Released: 04/13/2005 Document Revised: 04/02/2011 Document Reviewed: 07/11/2010 [facility] Patient Information 2012 [place], [place].Nausea and Vomiting Nausea means you feel sick to your stomach. Throwing up (vomiting) is a reflex where stomach contents come out of your mouth. HOME CARE   Take medicine as told by your doctor.   Do not force yourself to eat. However, you do need to drink fluids.   If you feel like eating, eat a normal diet as told by your doctor.   Eat rice, wheat, potatoes, bread, lean meats, yogurt, fruits, and vegetables.   Avoid high-fat foods.   Drink enough fluids to keep your pee (urine) clear or pale yellow.   Ask your doctor how to replace body fluid losses (rehydrate). Signs of body fluid loss (dehydration) include:   Feeling very thirsty.   Dry lips and mouth.   Feeling dizzy.   Dark pee.   Peeing less than normal.   Feeling confused.   Fast breathing or heart rate.  GET HELP RIGHT AWAY IF:   You have blood in your throw up.   You have black or bloody poop (stool).   You have a bad headache or stiff neck.   You feel confused.   You have bad belly (abdominal) pain.   You have chest pain or trouble breathing.   You do not pee at least once every 8 hours.   You have cold, clammy skin.   You keep throwing up after 24 to 48 hours.   You have a fever.  MAKE SURE YOU:   Understand these instructions.   Will watch your condition.   Will get help right away if you are not doing well or get worse.  Document Released: 09/30/2007 Document Revised: 04/02/2011 Document Reviewed: 09/12/2010 [facility] Patient Information 2012 [place], [place].  RESOURCE GUIDE  Chronic Pain Problems: Contact [facility]  [phone] Patients need to be referred by their primary care doctor.  Insufficient Money for Medicine: Contact United Way:  call "211" or [facility]  Ministry [phone].  No Primary Care Doctor: - Call Health Connect  [phone] - can help you locate a primary care doctor that  accepts your insurance, provides certain services, etc. - Physician Referral [phone]  Agencies that provide inexpensive medical care: - [facility]  [phone] - [facility]  [phone] - [facility]  [phone] [facility]  [phone] - [facility]  [phone] [facility]  [phone]  Medicaid-accepting [facility] Providers: - [facility]- [address]  [phone], Mon-Fri 9am-7pm, Sat 9am-1pm - [facility]- [address] [place], [address] [place]  [phone] - [facility]- [address], [address] [place]  [phone] [facility]- [address]  [phone] - Adrian Saran  [place]- [address], [phone]  Only accepts [place] [employer] patients after they have their name  applied to their card  Self Pay (no insurance) in [facility]: - Sickle Cell Patients: Dr Willey Blade, [facility]  [address] [place], [phone] - [facility]- [address] [place]  [phone]       [facility] [place]- [address]       -     [facility]- see information above (Speak to [employer] if you do not have insurance)       -  [facility]- [address] [place], [phone]       -  [facility]- [address],  [phone]       -  [facility]- [address], [phone]       -  Dr Julio Sicks-  [address], [place], [phone]       -  [facility]- [address] [phone]       -  [facility]- [address] [phone], also [address] [phone]       -    [facility]- [address] [place], [phone], 1st & 3rd Saturday   every month,  10am-1pm  1) Find a Doctor and Pay Out of Pocket Although you won't have to find out who is covered by your insurance plan, it is a good idea to ask around and get recommendations. You will then need to call the office and see if the doctor you have chosen will accept you as a new patient and what types of options they offer for patients who are self-pay. Some doctors offer discounts or will set up payment plans for their patients who do not have insurance, but you will need to ask so you aren't surprised when you get to your appointment.  2) Contact Your Local Health Department Not all health departments have doctors that can see patients for sick visits, but many do, so it is worth a call to see if yours does. If you don't know where your local health department is, you can check in your phone book. The CDC also has a tool to help you locate your state's health department, and many state websites also have listings of all of their local health departments.  3) Find a Walk-in Clinic If your illness is not likely to be very severe or complicated, you may want to try a walk in clinic. These are popping up all over the country in pharmacies, drugstores, and shopping centers. They're usually staffed by nurse practitioners or physician assistants that have been trained to treat common illnesses and complaints. They're usually fairly quick and inexpensive. However, if you have serious medical issues or chronic medical problems, these are probably not your best option  STD Testing - [facility] Department of [facility], STD Clinic, [address], [place], phone [phone] or [phone].  Monday - Friday, call for an appointment. [facility] Department of [employer], STD Clinic, [address], [place], phone [phone] or [phone].  Monday - Friday, call for an appointment.  Abuse/Neglect: [facility] Child Abuse Hotline ([phone] [facility] Child Abuse Hotline 343-409-6482 (After Hours)  Emergency Shelter:  [facility] [phone]  Maternity Homes: - Room at the [facility] [phone] - [facility] [phone]  MRSA Hotline #:   907-131-7468  [facility] Resources  [facility] [place]  [employer] [facility] Dept. [address]                 [address]         [address] [place] [address]  Blondell Reveal Phone:  [phone]                                  Phone:  [phone]                   Phone:  [phone]  [facility] Mental Health, [phone] - [phone]       -     [facility] in [place], [address],                                  [phone], [facility] Child Abuse Hotline 725-688-0802 or 617-156-3046 (After Hours)   Behavioral Health Services  Substance Abuse Resources: - Alcohol and Drug Services  848-627-1824 - [facility] [phone] - [facility] [phone] Floydene Flock [phone] - Residential & Outpatient Substance Abuse Program  [phone]  Psychological Services: [facility]  [phone] Services  [phone] - [employer], [phone] [place]. [address], [place], ACCESS LINE: 519-676-1168 or 847-080-5081, [employer]  Dental Assistance  If unable to pay or uninsured, contact:  [facility] or [facility]. to become qualified for the adult dental clinic.  Patients with Medicaid: [facility] [phone] [address], [phone] [address] [phone]  If unable to pay, or uninsured, contact [facility] [phone]) or [facility]  Department [phone] in [place], [phone] in [facility]) to become qualified for the adult dental clinic  Other Low-Cost Community Dental Services: - [facility]- [address] [place], [place], [place], [address], [phone], Ext. [phone], 2nd and 4th Thursday of the month at 6:30am.  10 clients each day by appointment, can sometimes see walk-in patients if someone does not show for an appointment. [facility]- [address] [place]ins [place], [place], [address], [phone] - [facility]- [address] [place], [place], [address], [phone] - [place] [facility]- [phone] [facility] Health Department- [phone] [facility] Department- [phone]

## 2011-10-20 NOTE — ED Provider Notes (Signed)
History     CSN: [id number]  Arrival date & time 10/20/11  7829   First MD Initiated Contact with Patient 10/20/11 [phone]      Chief Complaint  Patient presents with  . Emesis  . Abdominal Pain    (Consider location/radiation/quality/duration/timing/severity/associated sxs/prior treatment) HPI Comments: [name] is a 56 y.o. Male who states that he has had abdominal pain since 4 AM this morning. It started out gradually and got worse. After arrival to the hospital today; he vomited; food particles came out. He denies recent fever, chills, cough, shortness of breath, or chest pain. He was a day surgery. This morning, to have a left groin catheter removed, and a right chest catheter placed, for dialysis. His next dialysis day is tomorrow. He was anorexic this morning, and has not tried eating or drinking. He has not taken anything to improve his pain.  Patient is a 56 y.o. male presenting with vomiting and abdominal pain. The history is provided by the patient.  Emesis  Associated symptoms include abdominal pain.  Abdominal Pain The primary symptoms of the illness include abdominal pain and vomiting.    Past Medical History  Diagnosis Date  . HTN (hypertension)   . Anxiety   . Back pain   . GERD (gastroesophageal reflux disease)   . Peripheral neuropathy   . Arthritis   . CHF (congestive heart failure)   . Active smoker   . Dialysis patient     M-W-F  . Renal failure     MWF dialysis at [facility]  . Shortness of breath   . Diabetes mellitus     diet controlled  . 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 PONV (postoperative nausea and vomiting)     Past Surgical History  Procedure Date  . Total hip arthroplasty   . Thyroidectomy   . Tooth extraction   . Mandible fracture surgery   . Dg av dialysis graft declot or   . Insertion of dialysis catheter 03/28/2011    Procedure: INSERTION OF DIALYSIS CATHETER;  Surgeon: Pryor Ochoa, MD;  Location[name] OR;   Service: Vascular;  Laterality: Left;  . Av fistula placement 03/31/2011    Procedure: INSERTION OF ARTERIOVENOUS (AV) GORE-TEX GRAFT THIGH;  Surgeon: Sherren Kerns, MD;  Location[facility] OR;  Service: Vascular;  Laterality: Right;  redo right thigh arteriovenous gortex graft using gore-tex stretch 6mm x 70cm  . Thrombectomy w/ embolectomy 06/02/2011    Procedure: THROMBECTOMY ARTERIOVENOUS GORE-TEX GRAFT;  Surgeon: Chuck Hint, MD;  Location: [facility] OR;  Service: Vascular;  Laterality: Right;  Thrombectomy right thigh arteriovenous gortex graft;  revision by  replacement of large portion of graft with 7mm gore-tex   . Insertion of dialysis catheter 08/28/2011    Procedure: INSERTION OF DIALYSIS CATHETER;  Surgeon: Fransisco Hertz, MD;  Location[facility] OR;  Service: Vascular;  Laterality: Left;  Atempted Bilateral Internal Jugular, Bilateral Subclavin insertion of 55cm Dialysis Catheter Left Femoral    Family History  Problem Relation Age of Onset  . Diabetes Mother   . Stroke Mother   . Heart disease Mother   . Kidney disease Father   . Hyperlipidemia Father     History  Substance Use Topics  . Smoking status: Current Everyday Smoker -- 1.0 packs/day for 35 years    Types: Cigarettes  . Smokeless tobacco: Former Neurosurgeon    Types: Chew  . Alcohol Use:  No      Review of Systems  Gastrointestinal: Positive for vomiting and abdominal pain.  All other systems reviewed and are negative.    Allergies  Review of patient's allergies indicates no known allergies.  Home Medications   Current Outpatient Rx  Name Route Sig Dispense Refill  . ASPIRIN 325 MG PO TBEC Oral Take 325 mg by mouth daily.      Marland Kitchen KAOPECTATE PO Oral Take 30 mg by mouth once. X 3 doses    . NEPHRO-VITE 0.8 MG PO TABS Oral Take 0.8 mg by mouth at bedtime.     Marland Kitchen CALCIUM ACETATE 667 MG PO CAPS Oral Take 2,668 mg by mouth 3 (three) times daily with meals.      . COLCHICINE 0.6 MG PO TABS Oral Take 0.6 mg by mouth 2 (two)  times daily as needed. For gout    . ESOMEPRAZOLE MAGNESIUM 40 MG PO CPDR Oral Take 40 mg by mouth every evening.     Marland Kitchen GABAPENTIN 100 MG PO CAPS Oral Take 100 mg by mouth at bedtime as needed. For flare up/pain    . HYDROXYZINE HCL 25 MG PO TABS Oral Take 25 mg by mouth every 8 (eight) hours as needed. For itching     . OXYCODONE HCL 5 MG PO TABS Oral Take 5 mg by mouth every 6 (six) hours as needed. For pain    . SEVELAMER CARBONATE 2.4 G PO PACK Oral Take 2.4 g by mouth 3 (three) times daily as needed. To control potassium level    . SILDENAFIL CITRATE 100 MG PO TABS Oral Take 100 mg by mouth daily as needed. For erectile dysfunction      . SODIUM BICARBONATE 650 MG PO TABS Oral Take 650 mg by mouth 2 (two) times daily.      . SULINDAC 200 MG PO TABS Oral Take 200 mg by mouth 2 (two) times daily.    Marland Kitchen ONDANSETRON HCL 8 MG PO TABS Oral Take 1 tablet (8 mg total) by mouth every 8 (eight) hours as needed for nausea. 20 tablet 0  . OXYCODONE-ACETAMINOPHEN 5-325 MG PO TABS Oral Take 1 tablet by mouth every 4 (four) hours as needed for pain. 30 tablet 0    BP 129/89  Pulse 82  Temp 97.7 F (36.5 C) (Oral)  Resp 19  SpO2 99%  Physical Exam  Nursing note and vitals reviewed. Constitutional: He is oriented to person, place, and time. He appears well-developed and well-nourished. Distressed: Mild.       Nontoxic appearance, obese  HENT:  Head: Normocephalic and atraumatic.  Right Ear: External ear normal.  Left Ear: External ear normal.  Eyes: Conjunctivae and EOM are normal. Pupils are equal, round, and reactive to light.  Neck: Normal range of motion and phonation normal. Neck supple.  Cardiovascular: Normal rate, regular rhythm, normal heart sounds and intact distal pulses.   Pulmonary/Chest: Effort normal and breath sounds normal. He exhibits no bony tenderness.  Abdominal: Soft. Normal appearance. There is tenderness (Mild upper).       Hypoactive bowel sounds  Musculoskeletal:  Normal range of motion.  Neurological: He is alert and oriented to person, place, and time. He has normal strength. No cranial nerve deficit or sensory deficit. He exhibits normal muscle tone. Coordination normal.  Skin: Skin is warm, dry and intact.  Psychiatric: He has a normal mood and affect. His behavior is normal. Judgment and thought content normal.    ED Course  Procedures (including critical care time)  Emergency department treatment: IM Dilaudid, and oral Zofran.  Reevaluation: 12:30- tolerating liquids and crackers. He, states that he is out of his oxycodone. He has chronic low back pain. He denies abdominal pain at his time. He has a history of gallbladder disease.    Labs Reviewed  CBC - Abnormal; Notable for the following:    WBC 22.2 (*)     RBC 4.18 (*)     Hemoglobin 12.2 (*)     HCT 38.8 (*)     RDW 17.7 (*)     All other components within normal limits  DIFFERENTIAL - Abnormal; Notable for the following:    Neutrophils Relative 97 (*)     Neutro Abs 21.4 (*)     Lymphocytes Relative 2 (*)     Lymphs Abs 0.4 (*)     Monocytes Relative 1 (*)     All other components within normal limits  COMPREHENSIVE METABOLIC PANEL - Abnormal; Notable for the following:    Sodium 134 (*)     Chloride 95 (*)     Glucose, Bld 100 (*)     BUN 31 (*)     Creatinine, Ser 8.75 (*)     Total Protein 8.8 (*)     AST 101 (*)     Alkaline Phosphatase 224 (*)     Total Bilirubin 1.7 (*)     GFR calc non Af Amer 6 (*)     GFR calc Af Amer 7 (*)     All other components within normal limits   Dg Abd Acute W/chest  10/20/2011  *RADIOLOGY REPORT*  Clinical Data: Shortness of breath, abdominal pain, weakness  ACUTE ABDOMEN SERIES (ABDOMEN 2 VIEW & CHEST 1 VIEW)  Comparison: Chest radiograph dated 08/28/2011  Findings: Mild multifocal patchy opacities in the left upper lobe and right mid lung, similar to the prior study, possibly reflecting interstitial edema or infection.  No pleural  effusion or pneumothorax.  The heart is normal in size.  Nonobstructive bowel gas pattern.  No evidence of free air on the lateral decubitus view.  Left groin dual lumen dialysis catheter.  Status post ORIF of the right proximal femur.  Surgical clips overlying the bilateral neck.  IMPRESSION: Mild multifocal patchy opacities in the left upper lobe and right mid lung, similar to the prior study, possibly reflecting interstitial edema or infection.  No evidence of small bowel obstruction or free air.  Original Report Authenticated By: Charline Bills, M.D.     1. Vomiting   2. Abdominal pain       MDM  Nonspecific abdominal pain. Differential diagnosis includes early gastroenteritis, gallbladder disease, and transient bowel obstruction. Doubt metabolic instability, serious bacterial infection or impending vascular collapse; the patient is stable for discharge.   Plan: Home Medications- Zofran, Percocet; Home Treatments- gradual diet advance; Recommended follow up- PCP of choice 1-2 weeks.        Flint Melter, MD 10/20/11 [phone]

## 2011-10-20 NOTE — ED Notes (Signed)
Pt tolerated fluids and crackers. MD made aware and at bedside speaking to pt/family.

## 2011-10-20 NOTE — ED Notes (Signed)
Pt remains in X-Ray, will begin IV and give medications when pt returns. Family updated.

## 2011-10-20 NOTE — ED Notes (Signed)
Unable to get IV started- MD made aware and medications given IM/PO

## 2011-10-20 NOTE — ED Notes (Signed)
IV Team paged to start IV.  

## 2011-10-20 NOTE — ED Notes (Signed)
Pt reports scheduled to have graft removed from left leg and place new graft in chest. Pt began to have abdominal pain/emesis around 0400am. Pt states ate around 2330 last night.

## 2011-10-21 ENCOUNTER — Encounter (HOSPITAL_COMMUNITY): Payer: Self-pay | Admitting: Respiratory Therapy

## 2011-10-21 ENCOUNTER — Other Ambulatory Visit: Payer: Self-pay | Admitting: *Deleted

## 2011-10-26 ENCOUNTER — Other Ambulatory Visit: Payer: Self-pay | Admitting: *Deleted

## 2011-10-26 MED ORDER — SODIUM CHLORIDE 0.9 % IJ SOLN: 3.0000 mL | INTRAMUSCULAR | Status: DC | PRN

## 2011-10-27 ENCOUNTER — Encounter (HOSPITAL_COMMUNITY)
Admission: RE | Disposition: A | Discharge status: Home / Self Care | Payer: Self-pay | Source: Ambulatory Visit | Attending: Surgery

## 2011-10-27 ENCOUNTER — Telehealth: Payer: Self-pay | Admitting: Vascular Surgery

## 2011-10-27 ENCOUNTER — Ambulatory Visit (HOSPITAL_COMMUNITY)
Admission: RE | Admit: 2011-10-27 | Discharge: 2011-10-27 | Disposition: A | Discharge status: Home / Self Care | Payer: Medicare Other | Source: Ambulatory Visit | Attending: Surgery | Admitting: Surgery

## 2011-10-27 DIAGNOSIS — N186 End stage renal disease: Secondary | ICD-10-CM

## 2011-10-27 DIAGNOSIS — I749 Embolism and thrombosis of unspecified artery: Secondary | ICD-10-CM | POA: Insufficient documentation

## 2011-10-27 HISTORY — PX: VENOGRAM: SHX5497

## 2011-10-27 LAB — POCT I-STAT, CHEM 8
Chloride: 102 mEq/L (ref 96–112)
Creatinine, Ser: 8.2 mg/dL — ABNORMAL HIGH (ref 0.50–1.35)
Glucose, Bld: 95 mg/dL (ref 70–99)
Potassium: 4.4 mEq/L (ref 3.5–5.1)

## 2011-10-27 SURGERY — VENOGRAM
Anesthesia: LOCAL | Laterality: Bilateral

## 2011-10-27 NOTE — H&P (Signed)
  Patient is a 28 her old male returns for followup today. He underwent from ectomy and revision of a right thigh AV graft on May 1. This subsequently occluded 48 hours later. He currently is dialyzing via left femoral Diatek catheter. He presents today to recheck the incision in his right groin as well as consideration for a new access. He has had multiple prior upper extremity grafts which have all failed. His right thigh graft has now failed.  Physical exam:  Filed Vitals:    09/24/11 0936   BP:  124/73   Pulse:  75   Resp:  16   Height:  5\' 11"  (1.803 m)   Weight:  292 lb 12.8 oz (132.813 kg)   SpO2:  100%    Right groin small amount of serous drainage with some separation at the upper aspect and lower aspect of the the incision. No erythema  Left groin Diatek no drainage or erythema  Assessment: Healing right groin wound but will need to recheck this in a few weeks to make sure heals to completion  Patient is running out of access options. We will schedule him for a central venogram of both upper extremities to see if he may be a candidate for a HERO graft. If not we would need to consider moving his Diatek catheter to a different vein location so that we could consider a left thigh graft  He will followup with me after his central venogram    Durene Cal

## 2011-10-27 NOTE — Telephone Encounter (Addendum)
Message copied by Shari Prows on Tue Oct 27, 2011 12:09 PM ------      Message from: Melene Plan      Created: Tue Oct 27, 2011 11:24 AM                   ----- Message -----         From: Nada Libman, MD         Sent: 10/27/2011  11:05 AM           To: Reuel Derby, Melene Plan, RN            10/27/2011, the patient upon procedures:             1.  Bilateral venogram            Please schedule him to followup with Dr. Darrick Penna to discuss dialysis options  I scheduled an appt for above patient on 11/26/11 at 1pm w/ [facility]. I spoke w/ pt's wife regarding appt and also mailed an appt letter. Jacklyn Shell

## 2011-10-27 NOTE — Interval H&P Note (Signed)
History and Physical Interval Note:  10/27/2011 10:27 AM  [name]  has presented today for surgery, with the diagnosis of instage renal  The various methods of treatment have been discussed with the patient and family. After consideration of risks, benefits and other options for treatment, the patient has consented to  Procedure(s) (LRB): VENOGRAM (N/A) as a surgical intervention .  The patient's history has been reviewed, patient examined, no change in status, stable for surgery.  I have reviewed the patients' chart and labs.  Questions were answered to the patient's satisfaction.     Wenceslaus Gist IV, V. WELLS

## 2011-10-27 NOTE — Op Note (Signed)
[facility]  Patient name: ERRICK SALTS MRN: [id number] DOB: 1955/10/23 Sex: male  10/27/2011 Pre-operative Diagnosis: ESRD Post-operative diagnosis:  Same Surgeon:  Jorge Ny Procedure Performed:  1.  Bilateral venogram    Indications:  The patient comes in today for access management options. Bilateral upper extremity and central venogram for requested by Dr. Darrick Penna.  Procedure:  Antecubital IVs were placed in the short stay area. Contrast injections were performed through the IVs.  Findings:   Right arm: Opacification of the cephalic and brachial veins are identified. The axillary and subclavian vein appeared to be patent. Limited evaluation of the central venous system was obtained there appears to be a occlusion of the innominate vein as contrast is visualized refluxing up into the internal jugular vein. I cannot rule out a high-grade stenosis within the innominate vein/superior vena cava. However, I suspect he has central vein occlusion Left arm: The left axillary vein and subclavian veins appear to be patent, however the remaining portion the central venous system is occluded.    Impression:  #1  suspect central vein occlusion on the right, however a high-grade stenosis cannot be ruled out. It was difficult to fully evaluate this area.   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 Durene Cal, M.D. [facility] [place] Office: [phone] Pager:  [phone]

## 2011-10-30 ENCOUNTER — Other Ambulatory Visit: Payer: Self-pay | Admitting: Nephrology

## 2011-10-30 DIAGNOSIS — M545 Low back pain: Secondary | ICD-10-CM

## 2011-11-06 ENCOUNTER — Ambulatory Visit
Admission: RE | Admit: 2011-11-06 | Discharge: 2011-11-06 | Disposition: A | Discharge status: Home / Self Care | Payer: Medicare Other | Source: Ambulatory Visit | Attending: Nephrology | Admitting: Nephrology

## 2011-11-06 DIAGNOSIS — M545 Low back pain: Secondary | ICD-10-CM

## 2011-11-25 ENCOUNTER — Encounter: Payer: Self-pay | Admitting: Vascular Surgery

## 2011-11-26 ENCOUNTER — Ambulatory Visit (INDEPENDENT_AMBULATORY_CARE_PROVIDER_SITE_OTHER): Payer: Medicare Other | Admitting: Vascular Surgery

## 2011-11-26 ENCOUNTER — Encounter: Payer: Self-pay | Admitting: Vascular Surgery

## 2011-11-26 VITALS — BP 171/99 | HR 76 | Temp 98.3°F | Ht 71.0 in | Wt 274.0 lb

## 2011-11-26 DIAGNOSIS — N186 End stage renal disease: Secondary | ICD-10-CM

## 2011-11-26 NOTE — Progress Notes (Signed)
Patient is a 56-year-old male who we are currently evaluating for a new hemodialysis access. He had a central venogram performed by Dr. Brabham on July 2. 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 He returns today for further followup. He is currently dialyzing via left femoral Diatek catheter.  He dialyzes on Monday Wednesday and Friday.  Review of systems: He denies shortness of breath. He denies chest pain.  Physical exam: Filed Vitals:   11/26/11 1257  BP: 171/99  Pulse: 76  Temp: 98.3 F (36.8 C)  TempSrc: Oral  Height: 5' 11" (1.803 m)  Weight: 274 lb (124.286 kg)  SpO2: 100%   Bilateral lower extremities no significant edema, left femoral Diatek catheter Chest clear to auscultation Cardiac regular rate  Assessment: Limited hemodialysis access options. I do not believe his central venous system will support a hero graft.  I believe his only option at this point would be to try to get a right femoral Diatek in place and then try to place a left thigh graft. The patient is going to think about this and called me back. This would be a two-stage procedure. The first operation would be to place a right femoral Diatek. If we're successful at that would remove the left femoral Diatek. We would then give him several weeks before placing the left thigh graft.  Plan: See above 

## 2011-12-03 ENCOUNTER — Other Ambulatory Visit: Payer: Self-pay

## 2011-12-14 ENCOUNTER — Encounter (HOSPITAL_COMMUNITY): Payer: Self-pay

## 2011-12-14 MED ORDER — DEXTROSE 5 % IV SOLN
3.0000 g | INTRAVENOUS | Status: AC
  Administered 2011-12-15: 3 g via INTRAVENOUS
  Filled 2011-12-14 (×2): qty 3000

## 2011-12-14 MED ORDER — SODIUM CHLORIDE 0.9 % IV SOLN
INTRAVENOUS | Status: DC
  Administered 2011-12-15: 11:00:00 via INTRAVENOUS

## 2011-12-15 ENCOUNTER — Encounter (HOSPITAL_COMMUNITY)
Admission: RE | Disposition: A | Discharge status: Home / Self Care | Payer: Self-pay | Source: Ambulatory Visit | Attending: Vascular Surgery

## 2011-12-15 ENCOUNTER — Encounter (HOSPITAL_COMMUNITY): Payer: Self-pay | Admitting: *Deleted

## 2011-12-15 ENCOUNTER — Ambulatory Visit (HOSPITAL_COMMUNITY)
Admission: RE | Admit: 2011-12-15 | Discharge: 2011-12-15 | Disposition: A | Discharge status: Home / Self Care | Payer: Medicare Other | Source: Ambulatory Visit | Attending: Vascular Surgery | Admitting: Vascular Surgery

## 2011-12-15 ENCOUNTER — Ambulatory Visit (HOSPITAL_COMMUNITY): Payer: Medicare Other | Admitting: *Deleted

## 2011-12-15 ENCOUNTER — Ambulatory Visit (HOSPITAL_COMMUNITY): Payer: Medicare Other

## 2011-12-15 DIAGNOSIS — K219 Gastro-esophageal reflux disease without esophagitis: Secondary | ICD-10-CM | POA: Insufficient documentation

## 2011-12-15 DIAGNOSIS — Z4901 Encounter for fitting and adjustment of extracorporeal dialysis catheter: Secondary | ICD-10-CM | POA: Insufficient documentation

## 2011-12-15 DIAGNOSIS — F411 Generalized anxiety disorder: Secondary | ICD-10-CM | POA: Insufficient documentation

## 2011-12-15 DIAGNOSIS — N186 End stage renal disease: Secondary | ICD-10-CM | POA: Insufficient documentation

## 2011-12-15 DIAGNOSIS — I120 Hypertensive chronic kidney disease with stage 5 chronic kidney disease or end stage renal disease: Secondary | ICD-10-CM | POA: Insufficient documentation

## 2011-12-15 DIAGNOSIS — Z992 Dependence on renal dialysis: Secondary | ICD-10-CM | POA: Insufficient documentation

## 2011-12-15 DIAGNOSIS — F17200 Nicotine dependence, unspecified, uncomplicated: Secondary | ICD-10-CM | POA: Insufficient documentation

## 2011-12-15 HISTORY — PX: INSERTION OF DIALYSIS CATHETER: SHX1324

## 2011-12-15 LAB — GLUCOSE, CAPILLARY: Glucose-Capillary: 85 mg/dL (ref 70–99)

## 2011-12-15 LAB — POCT I-STAT 4, (NA,K, GLUC, HGB,HCT)
Glucose, Bld: 91 mg/dL (ref 70–99)
HCT: 37 % — ABNORMAL LOW (ref 39.0–52.0)

## 2011-12-15 SURGERY — INSERTION OF DIALYSIS CATHETER
Anesthesia: Monitor Anesthesia Care | Site: Leg Upper | Laterality: Right | Wound class: Clean

## 2011-12-15 MED ORDER — FENTANYL CITRATE 0.05 MG/ML IJ SOLN
25.0000 ug | INTRAMUSCULAR | Status: DC | PRN
  Administered 2011-12-15: 50 ug via INTRAVENOUS

## 2011-12-15 MED ORDER — IOHEXOL 300 MG/ML  SOLN
INTRAMUSCULAR | Status: DC | PRN
  Administered 2011-12-15: 15 mL via INTRAVENOUS

## 2011-12-15 MED ORDER — LIDOCAINE HCL (CARDIAC) 20 MG/ML IV SOLN
INTRAVENOUS | Status: DC | PRN
  Administered 2011-12-15: 100 mg via INTRAVENOUS

## 2011-12-15 MED ORDER — ONDANSETRON HCL 4 MG/2ML IJ SOLN: 4.0000 mg | Freq: Four times a day (QID) | INTRAMUSCULAR | Status: DC | PRN

## 2011-12-15 MED ORDER — LABETALOL HCL 5 MG/ML IV SOLN
10.0000 mg | Freq: Once | INTRAVENOUS | Status: AC
  Administered 2011-12-15: 10 mg via INTRAVENOUS

## 2011-12-15 MED ORDER — SODIUM CHLORIDE 0.9 % IR SOLN
Status: DC | PRN
  Administered 2011-12-15: 12:00:00

## 2011-12-15 MED ORDER — FENTANYL CITRATE 0.05 MG/ML IJ SOLN
INTRAMUSCULAR | Status: AC
  Filled 2011-12-15: qty 2

## 2011-12-15 MED ORDER — FENTANYL CITRATE 0.05 MG/ML IJ SOLN
INTRAMUSCULAR | Status: DC | PRN
  Administered 2011-12-15: 50 ug via INTRAVENOUS
  Administered 2011-12-15: 100 ug via INTRAVENOUS
  Administered 2011-12-15 (×4): 50 ug via INTRAVENOUS

## 2011-12-15 MED ORDER — HYDRALAZINE HCL 20 MG/ML IJ SOLN
5.0000 mg | INTRAMUSCULAR | Status: AC
  Administered 2011-12-15 (×2): 5 mg via INTRAVENOUS

## 2011-12-15 MED ORDER — LIDOCAINE HCL (PF) 1 % IJ SOLN: INTRAMUSCULAR | Status: DC | PRN

## 2011-12-15 MED ORDER — SODIUM CHLORIDE 0.9 % IV SOLN
INTRAVENOUS | Status: DC | PRN
  Administered 2011-12-15 (×2): via INTRAVENOUS

## 2011-12-15 MED ORDER — ONDANSETRON HCL 4 MG/2ML IJ SOLN
INTRAMUSCULAR | Status: DC | PRN
  Administered 2011-12-15: 4 mg via INTRAVENOUS

## 2011-12-15 MED ORDER — 0.9 % SODIUM CHLORIDE (POUR BTL) OPTIME
TOPICAL | Status: DC | PRN
  Administered 2011-12-15: 1000 mL

## 2011-12-15 MED ORDER — HEPARIN SODIUM (PORCINE) 1000 UNIT/ML IJ SOLN
INTRAMUSCULAR | Status: AC
  Filled 2011-12-15: qty 1

## 2011-12-15 MED ORDER — LIDOCAINE HCL (PF) 1 % IJ SOLN
INTRAMUSCULAR | Status: AC
  Filled 2011-12-15: qty 30

## 2011-12-15 MED ORDER — OXYCODONE-ACETAMINOPHEN 7.5-325 MG PO TABS: 1.0000 | ORAL_TABLET | ORAL | Status: AC | PRN

## 2011-12-15 MED ORDER — HYDRALAZINE HCL 20 MG/ML IJ SOLN
INTRAMUSCULAR | Status: AC
  Filled 2011-12-15: qty 1

## 2011-12-15 MED ORDER — IOHEXOL 300 MG/ML  SOLN
INTRAMUSCULAR | Status: AC
  Filled 2011-12-15: qty 1

## 2011-12-15 MED ORDER — LABETALOL HCL 5 MG/ML IV SOLN
INTRAVENOUS | Status: AC
  Administered 2011-12-15: 10 mg via INTRAVENOUS
  Filled 2011-12-15: qty 4

## 2011-12-15 MED ORDER — MIDAZOLAM HCL 5 MG/5ML IJ SOLN
INTRAMUSCULAR | Status: DC | PRN
  Administered 2011-12-15: 0.5 mg via INTRAVENOUS

## 2011-12-15 MED ORDER — PROPOFOL 10 MG/ML IV EMUL
INTRAVENOUS | Status: DC | PRN
  Administered 2011-12-15: 200 mg via INTRAVENOUS

## 2011-12-15 MED ORDER — HEPARIN SODIUM (PORCINE) 1000 UNIT/ML IJ SOLN
INTRAMUSCULAR | Status: DC | PRN
  Administered 2011-12-15: 7 mL

## 2011-12-15 MED ORDER — MUPIROCIN 2 % EX OINT
TOPICAL_OINTMENT | CUTANEOUS | Status: AC
  Filled 2011-12-15: qty 22

## 2011-12-15 MED ORDER — FENTANYL CITRATE 0.05 MG/ML IJ SOLN
100.0000 ug | Freq: Once | INTRAMUSCULAR | Status: AC
  Administered 2011-12-15: 100 ug via INTRAVENOUS

## 2011-12-15 MED ORDER — LABETALOL HCL 5 MG/ML IV SOLN
INTRAVENOUS | Status: DC | PRN
  Administered 2011-12-15 (×2): 5 mg via INTRAVENOUS

## 2011-12-15 SURGICAL SUPPLY — 45 items
104207 ×3 IMPLANT
BAG BANDED W/RUBBER/TAPE 36X54 (MISCELLANEOUS) ×3 IMPLANT
BAG DECANTER FOR FLEXI CONT (MISCELLANEOUS) ×3 IMPLANT
CATH CANNON HEMO 15F 50CM (CATHETERS) ×3 IMPLANT
CATH CANNON HEMO 15FR 19 (HEMODIALYSIS SUPPLIES) IMPLANT
CATH CANNON HEMO 15FR 23CM (HEMODIALYSIS SUPPLIES) IMPLANT
CATH CANNON HEMO 15FR 31CM (HEMODIALYSIS SUPPLIES) IMPLANT
CATH CANNON HEMO 15FR 32CM (HEMODIALYSIS SUPPLIES) IMPLANT
CHLORAPREP W/TINT 26ML (MISCELLANEOUS) ×3 IMPLANT
CLOTH BEACON ORANGE TIMEOUT ST (SAFETY) ×3 IMPLANT
COVER DOME SNAP 22 D (MISCELLANEOUS) ×3 IMPLANT
COVER PROBE W GEL 5X96 (DRAPES) ×3 IMPLANT
COVER SURGICAL LIGHT HANDLE (MISCELLANEOUS) ×3 IMPLANT
DECANTER SPIKE VIAL GLASS SM (MISCELLANEOUS) IMPLANT
DRAPE C-ARM 42X72 X-RAY (DRAPES) IMPLANT
DRAPE CHEST BREAST 15X10 FENES (DRAPES) ×3 IMPLANT
GAUZE SPONGE 2X2 8PLY STRL LF (GAUZE/BANDAGES/DRESSINGS) ×4 IMPLANT
GAUZE SPONGE 4X4 16PLY XRAY LF (GAUZE/BANDAGES/DRESSINGS) ×3 IMPLANT
GLOVE BIO SURGEON STRL SZ7.5 (GLOVE) ×3 IMPLANT
GLOVE BIOGEL PI IND STRL 7.5 (GLOVE) ×2 IMPLANT
GLOVE BIOGEL PI INDICATOR 7.5 (GLOVE) ×1
GLOVE SURG SS PI 7.5 STRL IVOR (GLOVE) ×3 IMPLANT
GOWN PREVENTION PLUS XLARGE (GOWN DISPOSABLE) ×3 IMPLANT
GOWN STRL NON-REIN LRG LVL3 (GOWN DISPOSABLE) ×6 IMPLANT
KIT BASIN OR (CUSTOM PROCEDURE TRAY) ×3 IMPLANT
KIT ROOM TURNOVER OR (KITS) ×3 IMPLANT
NEEDLE 18GX1X1/2 (RX/OR ONLY) (NEEDLE) ×3 IMPLANT
NEEDLE HYPO 25GX1X1/2 BEV (NEEDLE) ×3 IMPLANT
NS IRRIG 1000ML POUR BTL (IV SOLUTION) ×3 IMPLANT
PACK SURGICAL SETUP 50X90 (CUSTOM PROCEDURE TRAY) ×3 IMPLANT
PAD ARMBOARD 7.5X6 YLW CONV (MISCELLANEOUS) ×6 IMPLANT
SET MICROPUNCTURE 5F STIFF (MISCELLANEOUS) ×6 IMPLANT
SPONGE GAUZE 2X2 STER 10/PKG (GAUZE/BANDAGES/DRESSINGS) ×2
SUT ETHILON 3 0 PS 1 (SUTURE) ×3 IMPLANT
SUT VICRYL 4-0 PS2 18IN ABS (SUTURE) ×3 IMPLANT
SYR 20CC LL (SYRINGE) ×6 IMPLANT
SYR 30ML LL (SYRINGE) IMPLANT
SYR 5ML LL (SYRINGE) ×6 IMPLANT
SYR CONTROL 10ML LL (SYRINGE) ×3 IMPLANT
SYRINGE 10CC LL (SYRINGE) ×3 IMPLANT
TAPE CLOTH SURG 4X10 WHT LF (GAUZE/BANDAGES/DRESSINGS) ×6 IMPLANT
TOWEL OR 17X24 6PK STRL BLUE (TOWEL DISPOSABLE) ×3 IMPLANT
TOWEL OR 17X26 10 PK STRL BLUE (TOWEL DISPOSABLE) ×3 IMPLANT
WATER STERILE IRR 1000ML POUR (IV SOLUTION) ×3 IMPLANT
WIRE HI TORQ VERSACORE J 260CM (WIRE) ×3 IMPLANT

## 2011-12-15 NOTE — Progress Notes (Signed)
BP remains elevated.  Anesthesia aware.  Hydralazine given as ordered.  Will continue to monitor.

## 2011-12-15 NOTE — Transfer of Care (Signed)
Immediate Anesthesia Transfer of Care Note  Patient: [name]  Procedure(s) Performed: Procedure(s) (LRB): INSERTION OF DIALYSIS CATHETER (Right) REMOVAL OF A DIALYSIS CATHETER (Left)  Patient Location: PACU  Anesthesia Type: General  Level of Consciousness: awake, oriented and patient cooperative  Airway & Oxygen Therapy: Patient Spontanous Breathing and Patient connected to nasal cannula oxygen  Post-op Assessment: Report given to PACU RN and Post -op Vital signs reviewed and stable  Post vital signs: Reviewed and stable  Complications: No apparent anesthesia complications

## 2011-12-15 NOTE — Preoperative (Signed)
Beta Blockers   Reason not to administer Beta Blockers:Not Applicable 

## 2011-12-15 NOTE — Progress Notes (Signed)
Spoke with Dr.Hodierne and notified him of b/p 221/129 and that pt has severe back pain rated 10/10 d/t pinched nerve.States that he is waiting on epidural injection but can't have it done until after this procedure.Per Dr.Hodierne will treat when he gets into the holding area

## 2011-12-15 NOTE — Anesthesia Postprocedure Evaluation (Signed)
  Anesthesia Post-op Note  Patient: [name]  Procedure(s) Performed: Procedure(s) (LRB): INSERTION OF DIALYSIS CATHETER (Right) REMOVAL OF A DIALYSIS CATHETER (Left)  Patient Location: PACU  Anesthesia Type: General  Level of Consciousness: awake and alert   Airway and Oxygen Therapy: Patient Spontanous Breathing  Post-op Pain: mild  Post-op Assessment: Post-op Vital signs reviewed, Patient's Cardiovascular Status Stable, Respiratory Function Stable, Patent Airway and No signs of Nausea or vomiting  Post-op Vital Signs: Reviewed and stable  Complications: No apparent anesthesia complications

## 2011-12-15 NOTE — H&P (View-Only) (Signed)
Patient is a 56 year old male who we are currently evaluating for a new hemodialysis access. He had a central venogram performed by Dr. Myra Gianotti on July 2. 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 He returns today for further followup. He is currently dialyzing via left femoral Diatek catheter.  He dialyzes on Monday Wednesday and Friday.  Review of systems: He denies shortness of breath. He denies chest pain.  Physical exam: Filed Vitals:   11/26/11 1257  BP: 171/99  Pulse: 76  Temp: 98.3 F (36.8 C)  TempSrc: Oral  Height: 5\' 11"  (1.803 m)  Weight: 274 lb (124.286 kg)  SpO2: 100%   Bilateral lower extremities no significant edema, left femoral Diatek catheter Chest clear to auscultation Cardiac regular rate  Assessment: Limited hemodialysis access options. I do not believe his central venous system will support a hero graft.  I believe his only option at this point would be to try to get a right femoral Diatek in place and then try to place a left thigh graft. The patient is going to think about this and called me back. This would be a two-stage procedure. The first operation would be to place a right femoral Diatek. If we're successful at that would remove the left femoral Diatek. We would then give him several weeks before placing the left thigh graft.  Plan: See above

## 2011-12-15 NOTE — Anesthesia Preprocedure Evaluation (Addendum)
Anesthesia Evaluation  Patient identified by MRN, date of birth, ID band Patient awake    Reviewed: Allergy & Precautions, H&P , NPO status , Patient's Chart, lab work & pertinent test results  History of Anesthesia Complications (+) PONV  Airway Mallampati: II TM Distance: >3 FB Neck ROM: full    Dental  (+) Edentulous Upper, Edentulous Lower and Dental Advisory Given   Pulmonary shortness of breath, Current Smoker,          Cardiovascular hypertension, +CHF     Neuro/Psych Anxiety  Neuromuscular disease    GI/Hepatic GERD-  ,(+) Hepatitis -  Endo/Other  Type 2obese  Renal/GU ESRF and DialysisRenal disease     Musculoskeletal   Abdominal   Peds  Hematology   Anesthesia Other Findings   Reproductive/Obstetrics                          Anesthesia Physical Anesthesia Plan  ASA: IV  Anesthesia Plan: MAC   Post-op Pain Management:    Induction: Intravenous  Airway Management Planned: Simple Face Mask  Additional Equipment:   Intra-op Plan:   Post-operative Plan:   Informed Consent: I have reviewed the patients History and Physical, chart, labs and discussed the procedure including the risks, benefits and alternatives for the proposed anesthesia with the patient or authorized representative who has indicated his/her understanding and acceptance.     Plan Discussed with: CRNA and Surgeon  Anesthesia Plan Comments:         Anesthesia Quick Evaluation

## 2011-12-15 NOTE — Interval H&P Note (Signed)
History and Physical Interval Note:  12/15/2011 11:22 AM  Loetta Rough  has presented today for surgery, with the diagnosis of End Stage Renal Disease  The various methods of treatment have been discussed with the patient and family. After consideration of risks, benefits and other options for treatment, the patient has consented to  Procedure(s) (LRB): INSERTION OF DIALYSIS CATHETER (Right) as a surgical intervention .  The patient's history has been reviewed, patient examined, no change in status, stable for surgery.  I have reviewed the patient's chart and labs.  Questions were answered to the patient's satisfaction.     FIELDS,CHARLES E

## 2011-12-15 NOTE — Op Note (Signed)
Procedure: Ultrasound-guided insertion of right femoral Diatek catheter         Removal Left femoral diatek  Preoperative diagnosis: End-stage renal disease  Postoperative diagnosis: Same  Anesthesia: General  Operative findings: 55 cm Diatek catheter right femoral vein  Indications: Patient is a 56 year old male who currently has a hemodialysis catheter in his left groin. We were asked to place a thigh graft. He needs to have his dialysis catheter moved from the left groin to the right groin. The right groin has previously had a thigh graft which has now been abandoned.  Operative details: After obtaining informed consent, the patient was taken to the operating room. The patient was placed in supine position on the operating room table.  After administration of general anesthesia, both groins were prepped and draped in usual sterile fashion. The patient was placed in reverse Trendelenburg position.  Ultrasound was used to identify the patient's right femoral vein.  The right femoral vein was difficult to identify given the patient's obesity. I was able to identify the right femoral artery. Using ultrasound guidance, the right femoral vein was successfully cannulated several times but I was not able to get the guidewire to advance. A J-wire, a Wholey wire, and a micropuncture wire were used. A contrast venogram was performed through the needle. The needle had apparently pierced the vein and on through the back wall. There is contrast extravasation however there was good flow through the native right femoral vein all the way into the inferior vena cava. The needle was removed and hemostasis obtained with direct pressure. Using road mapping from the venogram an additional right femoral vein puncture was performed..  A 0.035  Wholey guidewire was threaded into the right femoral vein and into the inferior vena cava followed by the right atrium under fluoroscopic guidance.   Next sequential 12 and 14  dilators were placed over the guidewire into the right femoral vein.  A 16 French dilator with a peel-away sheath was then placed over the guidewire into the right femoral vein.   The dilator was removed; a 55 cm Diatek catheter was then placed through the peel away sheath into the right atrium using an over-the-wire technique.  The catheter was then tunneled subcutaneously, cut to length, and the hub attached. The catheter was noted to flush and draw easily. The catheter was inspected under fluoroscopy and wanted tips was in a hepatic vein.  I was able to pull back gently on the catheter and redirect this tip into the inferior vena cava and then advance both tips in the right atrium.  On final fluoroscopy both tips were found to be in the right atrium without any kinks throughout its course. The catheter was sutured to the skin with nylon sutures. The groin insertion site was closed with Vicryl stitch. The catheter was then loaded with concentrated Heparin solution. A dry sterile dressing was applied.   The suture on the left groin was clipped and the preexisting Diatek removed with gentle traction.  Hemostasis was obtained with 5 minutes of direct pressure.  Dermabond was applied as well as a sterile dressing.  The patient tolerated procedure well and there were no complications. Instrument sponge and needle counts were correct at the end of the case. The patient was taken to the recovery room in stable condition. Chest x-ray will be obtained in the recovery room.  Fabienne Bruns, MD [facility] [place] Office: [phone] Pager: [phone]

## 2011-12-15 NOTE — Progress Notes (Signed)
BP reported to Dr. Chaney Malling, Order given for Labetalol Iv 10mg .   Given BP Now 184/84.

## 2011-12-15 NOTE — Progress Notes (Signed)
BP 170's/90's. Anesthesia aware. Transferred to short stay.

## 2011-12-16 ENCOUNTER — Encounter (HOSPITAL_COMMUNITY): Payer: Self-pay | Admitting: Vascular Surgery

## 2011-12-16 MED FILL — Mupirocin Oint 2%: CUTANEOUS | Qty: 22 | Status: AC

## 2011-12-30 ENCOUNTER — Telehealth: Payer: Self-pay | Admitting: *Deleted

## 2011-12-30 NOTE — Telephone Encounter (Signed)
I received a staff message from Dr Darrick Penna on 12/15/11 to schedule a Left femoral vein central venogram. I have talked to [name] and his mother several times to schedule this procedure. [name] says he will check his schedule and call me back.His mother says she will have him call me. As of today it is not scheduled. I talked with Lamar Laundry at [facility] and faxed a copy of the staff message from Dr Darrick Penna. She is going to let the rounding MD and PA know; & call back to schedule hopefully.

## 2012-02-26 ENCOUNTER — Ambulatory Visit (HOSPITAL_COMMUNITY): Payer: Medicare Other

## 2012-02-26 ENCOUNTER — Ambulatory Visit (HOSPITAL_COMMUNITY)
Admission: AD | Admit: 2012-02-26 | Discharge: 2012-02-26 | Disposition: A | Discharge status: Home / Self Care | Payer: Medicare Other | Source: Ambulatory Visit | Attending: Vascular Surgery | Admitting: Vascular Surgery

## 2012-02-26 ENCOUNTER — Encounter (HOSPITAL_COMMUNITY): Payer: Self-pay | Admitting: Surgery

## 2012-02-26 ENCOUNTER — Encounter (HOSPITAL_COMMUNITY)
Admission: AD | Disposition: A | Discharge status: Home / Self Care | Payer: Self-pay | Source: Ambulatory Visit | Attending: Vascular Surgery

## 2012-02-26 ENCOUNTER — Other Ambulatory Visit: Payer: Self-pay

## 2012-02-26 ENCOUNTER — Encounter (HOSPITAL_COMMUNITY): Payer: Self-pay

## 2012-02-26 DIAGNOSIS — N186 End stage renal disease: Secondary | ICD-10-CM

## 2012-02-26 DIAGNOSIS — E119 Type 2 diabetes mellitus without complications: Secondary | ICD-10-CM | POA: Insufficient documentation

## 2012-02-26 DIAGNOSIS — Z4901 Encounter for fitting and adjustment of extracorporeal dialysis catheter: Secondary | ICD-10-CM | POA: Insufficient documentation

## 2012-02-26 DIAGNOSIS — I120 Hypertensive chronic kidney disease with stage 5 chronic kidney disease or end stage renal disease: Secondary | ICD-10-CM | POA: Insufficient documentation

## 2012-02-26 DIAGNOSIS — Z992 Dependence on renal dialysis: Secondary | ICD-10-CM | POA: Insufficient documentation

## 2012-02-26 HISTORY — DX: Essential (primary) hypertension: I10

## 2012-02-26 HISTORY — PX: EXCHANGE OF A DIALYSIS CATHETER: SHX5818

## 2012-02-26 LAB — SURGICAL PCR SCREEN: MRSA, PCR: NEGATIVE

## 2012-02-26 LAB — POCT I-STAT, CHEM 8
Creatinine, Ser: 9.7 mg/dL — ABNORMAL HIGH (ref 0.50–1.35)
Glucose, Bld: 88 mg/dL (ref 70–99)
HCT: 41 % (ref 39.0–52.0)
Hemoglobin: 13.9 g/dL (ref 13.0–17.0)
Sodium: 134 mEq/L — ABNORMAL LOW (ref 135–145)
TCO2: 19 mmol/L (ref 0–100)

## 2012-02-26 SURGERY — EXCHANGE OF A DIALYSIS CATHETER
Anesthesia: Monitor Anesthesia Care | Site: Thigh | Laterality: Right | Wound class: Clean

## 2012-02-26 MED ORDER — SODIUM CHLORIDE 0.9 % IV SOLN
INTRAVENOUS | Status: DC | PRN
  Administered 2012-02-26: 17:00:00 via INTRAVENOUS

## 2012-02-26 MED ORDER — MIDAZOLAM HCL 5 MG/5ML IJ SOLN
INTRAMUSCULAR | Status: DC | PRN
  Administered 2012-02-26: 1 mg via INTRAVENOUS

## 2012-02-26 MED ORDER — MUPIROCIN 2 % EX OINT
TOPICAL_OINTMENT | CUTANEOUS | Status: AC
  Administered 2012-02-26: 1 via NASAL
  Filled 2012-02-26: qty 22

## 2012-02-26 MED ORDER — CEFAZOLIN SODIUM 1-5 GM-% IV SOLN: 1.0000 g | Freq: Once | INTRAVENOUS | Status: DC

## 2012-02-26 MED ORDER — HEPARIN SODIUM (PORCINE) 1000 UNIT/ML IJ SOLN
INTRAMUSCULAR | Status: AC
  Filled 2012-02-26: qty 1

## 2012-02-26 MED ORDER — DEXTROSE 5 % IV SOLN
3.0000 g | Freq: Once | INTRAVENOUS | Status: AC
  Administered 2012-02-26: 3 g via INTRAVENOUS
  Filled 2012-02-26: qty 3000

## 2012-02-26 MED ORDER — CEFAZOLIN SODIUM-DEXTROSE 2-3 GM-% IV SOLR
INTRAVENOUS | Status: AC
  Filled 2012-02-26: qty 50

## 2012-02-26 MED ORDER — LIDOCAINE-EPINEPHRINE 0.5 %-1:200000 IJ SOLN
INTRAMUSCULAR | Status: AC
  Filled 2012-02-26: qty 1

## 2012-02-26 MED ORDER — SODIUM POLYSTYRENE SULFONATE 15 GM/60ML PO SUSP
30.0000 g | Freq: Once | ORAL | Status: DC
  Filled 2012-02-26: qty 120

## 2012-02-26 MED ORDER — HEPARIN SODIUM (PORCINE) 1000 UNIT/ML IJ SOLN
INTRAMUSCULAR | Status: DC | PRN
  Administered 2012-02-26: 7 mL

## 2012-02-26 MED ORDER — CEFAZOLIN SODIUM 1-5 GM-% IV SOLN
INTRAVENOUS | Status: AC
  Filled 2012-02-26: qty 50

## 2012-02-26 MED ORDER — MIDAZOLAM HCL 2 MG/2ML IJ SOLN
1.0000 mg | Freq: Once | INTRAMUSCULAR | Status: AC
  Administered 2012-02-26: 1 mg via INTRAVENOUS

## 2012-02-26 MED ORDER — MIDAZOLAM HCL 2 MG/2ML IJ SOLN
INTRAMUSCULAR | Status: AC
  Filled 2012-02-26: qty 2

## 2012-02-26 MED ORDER — SODIUM CHLORIDE 0.9 % IV SOLN
INTRAVENOUS | Status: DC
  Administered 2012-02-26: 17:00:00 via INTRAVENOUS

## 2012-02-26 MED ORDER — FENTANYL CITRATE 0.05 MG/ML IJ SOLN
INTRAMUSCULAR | Status: DC | PRN
  Administered 2012-02-26: 25 ug via INTRAVENOUS
  Administered 2012-02-26: 50 ug via INTRAVENOUS
  Administered 2012-02-26: 25 ug via INTRAVENOUS

## 2012-02-26 MED ORDER — MUPIROCIN 2 % EX OINT
TOPICAL_OINTMENT | Freq: Two times a day (BID) | CUTANEOUS | Status: DC
  Administered 2012-02-26: 1 via NASAL
  Filled 2012-02-26: qty 22

## 2012-02-26 MED ORDER — SODIUM CHLORIDE 0.9 % IR SOLN
Status: DC | PRN
  Administered 2012-02-26: 1000 mL

## 2012-02-26 MED ORDER — SODIUM CHLORIDE 0.9 % IR SOLN
Status: DC | PRN
  Administered 2012-02-26: 17:00:00

## 2012-02-26 MED ORDER — LIDOCAINE-EPINEPHRINE 0.5 %-1:200000 IJ SOLN
INTRAMUSCULAR | Status: DC | PRN
  Administered 2012-02-26: 50 mL

## 2012-02-26 SURGICAL SUPPLY — 42 items
BAG DECANTER FOR FLEXI CONT (MISCELLANEOUS) ×2 IMPLANT
CATH CANNON HEMO 15F 50CM (CATHETERS) ×2 IMPLANT
CATH CANNON HEMO 15FR 19 (HEMODIALYSIS SUPPLIES) IMPLANT
CATH CANNON HEMO 15FR 23CM (HEMODIALYSIS SUPPLIES) IMPLANT
CATH CANNON HEMO 15FR 32CM (HEMODIALYSIS SUPPLIES) IMPLANT
CLIP LIGATING EXTRA MED SLVR (CLIP) ×2 IMPLANT
CLIP LIGATING EXTRA SM BLUE (MISCELLANEOUS) ×2 IMPLANT
CLOTH BEACON ORANGE TIMEOUT ST (SAFETY) ×2 IMPLANT
COVER PROBE W GEL 5X96 (DRAPES) IMPLANT
COVER SURGICAL LIGHT HANDLE (MISCELLANEOUS) ×2 IMPLANT
DECANTER SPIKE VIAL GLASS SM (MISCELLANEOUS) ×2 IMPLANT
DRAPE C-ARM 42X72 X-RAY (DRAPES) ×2 IMPLANT
DRAPE CHEST BREAST 15X10 FENES (DRAPES) ×2 IMPLANT
GAUZE SPONGE 2X2 8PLY STRL LF (GAUZE/BANDAGES/DRESSINGS) ×1 IMPLANT
GAUZE SPONGE 4X4 16PLY XRAY LF (GAUZE/BANDAGES/DRESSINGS) ×2 IMPLANT
GLOVE BIOGEL PI IND STRL 6.5 (GLOVE) ×2 IMPLANT
GLOVE BIOGEL PI INDICATOR 6.5 (GLOVE) ×2
GLOVE ECLIPSE 6.5 STRL STRAW (GLOVE) ×2 IMPLANT
GLOVE SS BIOGEL STRL SZ 7.5 (GLOVE) ×1 IMPLANT
GLOVE SUPERSENSE BIOGEL SZ 7.5 (GLOVE) ×1
GOWN STRL NON-REIN LRG LVL3 (GOWN DISPOSABLE) ×4 IMPLANT
KIT BASIN OR (CUSTOM PROCEDURE TRAY) ×2 IMPLANT
KIT ROOM TURNOVER OR (KITS) ×2 IMPLANT
NEEDLE 18GX1X1/2 (RX/OR ONLY) (NEEDLE) ×2 IMPLANT
NEEDLE 22X1 1/2 (OR ONLY) (NEEDLE) ×2 IMPLANT
NEEDLE HYPO 25GX1X1/2 BEV (NEEDLE) ×2 IMPLANT
NS IRRIG 1000ML POUR BTL (IV SOLUTION) ×2 IMPLANT
PACK SURGICAL SETUP 50X90 (CUSTOM PROCEDURE TRAY) ×2 IMPLANT
PAD ARMBOARD 7.5X6 YLW CONV (MISCELLANEOUS) ×4 IMPLANT
SOAP 2 % CHG 4 OZ (WOUND CARE) ×2 IMPLANT
SPONGE GAUZE 2X2 STER 10/PKG (GAUZE/BANDAGES/DRESSINGS) ×1
SUT ETHILON 3 0 PS 1 (SUTURE) ×2 IMPLANT
SUT VICRYL 4-0 PS2 18IN ABS (SUTURE) ×2 IMPLANT
SYR 20CC LL (SYRINGE) ×2 IMPLANT
SYR 30ML LL (SYRINGE) IMPLANT
SYR 5ML LL (SYRINGE) ×4 IMPLANT
SYR CONTROL 10ML LL (SYRINGE) ×2 IMPLANT
SYRINGE 10CC LL (SYRINGE) ×2 IMPLANT
TAPE CLOTH SURG 4X10 WHT LF (GAUZE/BANDAGES/DRESSINGS) ×2 IMPLANT
TOWEL OR 17X24 6PK STRL BLUE (TOWEL DISPOSABLE) ×2 IMPLANT
TOWEL OR 17X26 10 PK STRL BLUE (TOWEL DISPOSABLE) ×2 IMPLANT
WATER STERILE IRR 1000ML POUR (IV SOLUTION) ×2 IMPLANT

## 2012-02-26 NOTE — Op Note (Signed)
OPERATIVE REPORT  DATE OF SURGERY: 02/26/2012  PATIENT: [name], 56 y.o. male MRN: [id number]  DOB: 12-03-1955  PRE-OPERATIVE DIAGNOSIS: end-stage renal disease  POST-OPERATIVE DIAGNOSIS:  Same  PROCEDURE: right femoral dialysis catheter exchange  SURGEON:  Gretta Began, M.D.    ANESTHESIA:  Local with sedation  EBL: minimal ml  Total I/O In: 100 [I.V.:100] Out: -   BLOOD ADMINISTERED: none  DRAINS: none  SPECIMEN: none  COUNTS CORRECT:  YES  PLAN OF CARE: PACU with chest x-ray pending   PATIENT DISPOSITION:  PACU - hemodynamically stable  PROCEDURE DETAILS: The patient was taken to the operating room and placed supine position where the area of the right groin was prepped and draped in usual sterile fashion. The patient had an existing hemodialysis catheter that had been partially pulled back. Using local anesthesia an incision was made in the groin at the point of catheter entering the femoral vein. The catheter was grasped in this position. The catheter was transected distal to the hemostat. A guidewire was passed through the existing catheter and this was confirmed with fluoroscopy. The existing catheter was removed in its entirety. A dilator and peel-away sheath was passed over the guidewire and a 50 cm dialysis catheter was passed over the guidewire through the peel-away sheath. The peel-away sheath and guidewire removed. The catheter was brought through a subcutaneous tunnel through a separate stab incision. The 2 lm ports were attached. Both lumens flushed and aspirated easily and were locked with 1000 cc. The catheter was secured to the skin with a 3-0 nylon stitch in the entry site was closed with a 4-0 subcuticular Vicryl stitch. Sterile dressing was applied and the patient was taken to the recovery room in stable condition   Gretta Began, M.D. 02/26/2012 5:59 PM

## 2012-02-26 NOTE — Anesthesia Postprocedure Evaluation (Signed)
  Anesthesia Post-op Note  Patient: [name]  Procedure(s) Performed: Procedure(s) (LRB) with comments: EXCHANGE OF A DIALYSIS CATHETER (Right)  Patient Location: PACU  Anesthesia Type:MAC  Level of Consciousness: awake and alert   Airway and Oxygen Therapy: Patient Spontanous Breathing and Patient connected to nasal cannula oxygen  Post-op Pain: mild  Post-op Assessment: Post-op Vital signs reviewed, Patient's Cardiovascular Status Stable, Respiratory Function Stable, Patent Airway and No signs of Nausea or vomiting  Post-op Vital Signs: Reviewed and stable  Complications: No apparent anesthesia complications

## 2012-02-26 NOTE — H&P (Signed)
  The patient is a hemodialysis this morning with his right femoral catheter partially pulled out. He was sent home and is instructed to come today for replacement. He denies fevers.  Past Medical History  Diagnosis Date  . Anxiety   . Back pain   . GERD (gastroesophageal reflux disease)   . Peripheral neuropathy   . Arthritis   . CHF (congestive heart failure)   . Active smoker   . Dialysis patient     M-W-F  . Renal failure     MWF dialysis at [facility]  . Shortness of breath   . Diabetes mellitus     diet controlled  . 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 PONV (postoperative nausea and vomiting)   . Hypertension     History  Substance Use Topics  . Smoking status: Current Every Day Smoker -- 1.0 packs/day for 35 years    Types: Cigarettes  . Smokeless tobacco: Former Neurosurgeon    Types: Chew   Comment: pt states that he wants to quit  . Alcohol Use: No    Family History  Problem Relation Age of Onset  . Diabetes Mother   . Stroke Mother   . Heart disease Mother   . Kidney disease Father   . Hyperlipidemia Father     No Known Allergies  Current facility-administered medications:0.9 %  sodium chloride infusion, , Intravenous, Continuous, Larina Earthly, MD, Last Rate: 20 mL/hr at 02/26/12 1639;  ceFAZolin (ANCEF) 3 g in dextrose 5 % 50 mL IVPB, 3 g, Intravenous, Once, Larina Earthly, MD;  ceFAZolin (ANCEF) IVPB 1 g/50 mL premix, 1 g, Intravenous, Once, Larina Earthly, MD;  midazolam (VERSED) injection 1 mg, 1 mg, Intravenous, Once, Aubery Lapping, MD, 1 mg at 02/26/12 1649 mupirocin ointment (BACTROBAN) 2 %, , Nasal, BID, Larina Earthly, MD, 1 application at 02/26/12 1558  BP 155/105  Pulse 82  Temp 98.7 F (37.1 C) (Oral)  Resp 18  Wt 275 lb 9.2 oz (125 kg)  SpO2 100%  There is no height on file to calculate BMI.    Physical exam: Well-developed gentleman in no acute distress complaining of chronic back pain Respirations nonlabored Heart regular  rate and rhythm Abdomen benign obese Right femoral catheter pulled with the cath expose  Impression and plan: Right femoral dialysis catheter pulled back with the cuff exposed. He will be taken to the operating room for catheter exchange

## 2012-02-26 NOTE — Progress Notes (Signed)
ISTAT drawn. K-6.1.  Dr. Kathline Magic and Dr. Arbie Cookey notified.  Okay for pt to proceed to holding.

## 2012-02-26 NOTE — Anesthesia Preprocedure Evaluation (Addendum)
Anesthesia Evaluation  Patient identified by MRN, date of birth, ID band Patient awake    Reviewed: H&P , NPO status , Patient's Chart, lab work & pertinent test results, reviewed documented beta blocker date and time   History of Anesthesia Complications (+) PONV  Airway Mallampati: II TM Distance: >3 FB Neck ROM: Full    Dental  (+) Poor Dentition   Pulmonary          Cardiovascular hypertension, Pt. on medications +CHF     Neuro/Psych    GI/Hepatic GERD-  Medicated and Controlled,(+) Hepatitis -, Unspecified  Endo/Other  diabetes, Type 1  Renal/GU      Musculoskeletal   Abdominal   Peds  Hematology   Anesthesia Other Findings   Reproductive/Obstetrics                          Anesthesia Physical Anesthesia Plan  ASA: III and Emergent  Anesthesia Plan: MAC   Post-op Pain Management:    Induction: Intravenous  Airway Management Planned: Natural Airway  Additional Equipment:   Intra-op Plan:   Post-operative Plan:   Informed Consent: I have reviewed the patients History and Physical, chart, labs and discussed the procedure including the risks, benefits and alternatives for the proposed anesthesia with the patient or authorized representative who has indicated his/her understanding and acceptance.     Plan Discussed with: CRNA, Surgeon and Anesthesiologist  Anesthesia Plan Comments:        Anesthesia Quick Evaluation

## 2012-02-26 NOTE — Transfer of Care (Cosign Needed)
Immediate Anesthesia Transfer of Care Note  Patient: [name]  Procedure(s) Performed: Procedure(s) (LRB) with comments: EXCHANGE OF A DIALYSIS CATHETER (Right)  Patient Location: PACU  Anesthesia Type:MAC  Level of Consciousness: awake, alert  and oriented  Airway & Oxygen Therapy: Patient Spontanous Breathing  Post-op Assessment: Report given to PACU RN and Post -op Vital signs reviewed and stable  Post vital signs: Reviewed and stable  Complications: No apparent anesthesia complications

## 2012-02-26 NOTE — Progress Notes (Signed)
Patient sent home with 30 grams of kayexalate per dr. Lajoyce Corners.  Patient instructed to take medicine once at home for the evening.  Patient instructed to go to HD in am.

## 2012-02-29 ENCOUNTER — Encounter (HOSPITAL_COMMUNITY): Payer: Self-pay | Admitting: Vascular Surgery

## 2012-04-21 ENCOUNTER — Encounter (HOSPITAL_BASED_OUTPATIENT_CLINIC_OR_DEPARTMENT_OTHER): Payer: Self-pay

## 2012-04-21 ENCOUNTER — Emergency Department (HOSPITAL_BASED_OUTPATIENT_CLINIC_OR_DEPARTMENT_OTHER): Payer: No Typology Code available for payment source

## 2012-04-21 ENCOUNTER — Emergency Department (HOSPITAL_BASED_OUTPATIENT_CLINIC_OR_DEPARTMENT_OTHER)
Admission: EM | Admit: 2012-04-21 | Discharge: 2012-04-21 | Disposition: A | Discharge status: Home / Self Care | Payer: No Typology Code available for payment source | Attending: Emergency Medicine | Admitting: Emergency Medicine

## 2012-04-21 DIAGNOSIS — F411 Generalized anxiety disorder: Secondary | ICD-10-CM | POA: Insufficient documentation

## 2012-04-21 DIAGNOSIS — Y92410 Unspecified street and highway as the place of occurrence of the external cause: Secondary | ICD-10-CM | POA: Insufficient documentation

## 2012-04-21 DIAGNOSIS — G909 Disorder of the autonomic nervous system, unspecified: Secondary | ICD-10-CM | POA: Insufficient documentation

## 2012-04-21 DIAGNOSIS — Y9389 Activity, other specified: Secondary | ICD-10-CM | POA: Insufficient documentation

## 2012-04-21 DIAGNOSIS — S139XXA Sprain of joints and ligaments of unspecified parts of neck, initial encounter: Secondary | ICD-10-CM | POA: Insufficient documentation

## 2012-04-21 DIAGNOSIS — E1149 Type 2 diabetes mellitus with other diabetic neurological complication: Secondary | ICD-10-CM | POA: Insufficient documentation

## 2012-04-21 DIAGNOSIS — S161XXA Strain of muscle, fascia and tendon at neck level, initial encounter: Secondary | ICD-10-CM

## 2012-04-21 DIAGNOSIS — K219 Gastro-esophageal reflux disease without esophagitis: Secondary | ICD-10-CM | POA: Insufficient documentation

## 2012-04-21 DIAGNOSIS — Z79899 Other long term (current) drug therapy: Secondary | ICD-10-CM | POA: Insufficient documentation

## 2012-04-21 DIAGNOSIS — F17200 Nicotine dependence, unspecified, uncomplicated: Secondary | ICD-10-CM | POA: Insufficient documentation

## 2012-04-21 DIAGNOSIS — M549 Dorsalgia, unspecified: Secondary | ICD-10-CM

## 2012-04-21 DIAGNOSIS — Z7982 Long term (current) use of aspirin: Secondary | ICD-10-CM | POA: Insufficient documentation

## 2012-04-21 DIAGNOSIS — N189 Chronic kidney disease, unspecified: Secondary | ICD-10-CM | POA: Insufficient documentation

## 2012-04-21 DIAGNOSIS — Z8619 Personal history of other infectious and parasitic diseases: Secondary | ICD-10-CM | POA: Insufficient documentation

## 2012-04-21 DIAGNOSIS — I129 Hypertensive chronic kidney disease with stage 1 through stage 4 chronic kidney disease, or unspecified chronic kidney disease: Secondary | ICD-10-CM | POA: Insufficient documentation

## 2012-04-21 DIAGNOSIS — I509 Heart failure, unspecified: Secondary | ICD-10-CM | POA: Insufficient documentation

## 2012-04-21 DIAGNOSIS — Z8739 Personal history of other diseases of the musculoskeletal system and connective tissue: Secondary | ICD-10-CM | POA: Insufficient documentation

## 2012-04-21 MED ORDER — CYCLOBENZAPRINE HCL 5 MG PO TABS: 5.0000 mg | ORAL_TABLET | Freq: Two times a day (BID) | ORAL | Status: DC | PRN
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
## 2012-04-21 NOTE — ED Provider Notes (Signed)
Medical screening examination/treatment/procedure(s) were performed by non-physician practitioner and as supervising physician I was immediately available for consultation/collaboration.   Charles B. Sheldon, MD 04/21/12 1405 

## 2012-04-21 NOTE — ED Notes (Signed)
Patient transported to X-ray 

## 2012-04-25 ENCOUNTER — Encounter: Payer: Self-pay | Admitting: Vascular Surgery

## 2012-04-26 ENCOUNTER — Ambulatory Visit: Payer: Medicare Other | Admitting: Vascular Surgery

## 2012-05-02 ENCOUNTER — Encounter: Payer: Self-pay | Admitting: Vascular Surgery

## 2012-05-03 ENCOUNTER — Ambulatory Visit (INDEPENDENT_AMBULATORY_CARE_PROVIDER_SITE_OTHER): Payer: Medicare Other | Admitting: Vascular Surgery

## 2012-05-03 ENCOUNTER — Encounter: Payer: Self-pay | Admitting: Vascular Surgery

## 2012-05-03 VITALS — BP 138/84 | HR 87 | Resp 20 | Ht 71.0 in | Wt 274.0 lb

## 2012-05-03 DIAGNOSIS — N186 End stage renal disease: Secondary | ICD-10-CM

## 2012-05-03 NOTE — Progress Notes (Signed)
[facility] [place] HISTORY AND PHYSICAL   CC: ESRD Referring Physician: [facility]. [facility]- M,W,F @ 6 am  History of Present Illness: [name] is a 57 y.o. male who has had multiple dialysis accesses and is now dialyzing through right femoral catheter which is working "ok" at this point he is here today for evaluation for left thigh graft.  Pt states that he has some weakness in both his legs with prolonged standing sec to lumbar disc disease. He denies numbness, tingling or claudication in either leg. He has had a AVGG in right leg without difficulty   No results found.  Past Medical History  Diagnosis Date  . Anxiety   . Back pain   . GERD (gastroesophageal reflux disease)   . Peripheral neuropathy   . Arthritis   . CHF (congestive heart failure)   . Active smoker   . Dialysis patient     M-W-F  . Renal failure     MWF dialysis at [facility]  . Shortness of breath   . Diabetes mellitus     diet controlled  . 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 PONV (postoperative nausea and vomiting)   . Hypertension     ROS: [x]  Positive   [ ]  Denies    General: [ ]  Weight loss, [ ]  Fever, [ ]  chills Neurologic: [ ]  Dizziness, [ ]  Blackouts, [ ]  Seizure [ ]  Stroke, [ ]  "Mini stroke", [ ]  Slurred speech, [ ]  Temporary blindness; [x ] weakness in arms or legs, [ ]  Hoarseness Cardiac: [ ]  Chest pain/pressure, [ ]  Shortness of breath at rest [x ] Shortness of breath with exertion, [ ]  Atrial fibrillation or irregular heartbeat Vascular: [ ]  Pain in legs with walking, [ ]  Pain in legs at rest, [ ]  Pain in legs at night,  [ ]  Non-healing ulcer, [ ]  Blood clot in vein/DVT,   Pulmonary: [ ]  Home oxygen, [ ]  Productive cough, [ ]  Coughing up blood, [ ]  Asthma,  [ ]  Wheezing Musculoskeletal:  [x ] Arthritis, [x ] Low back pain, [ ]  Joint pain Hematologic: [ ]  Easy Bruising, [x ] Anemia; [ ]  Hepatitis Gastrointestinal: [ ]   Blood in stool, [ ]  Gastroesophageal Reflux/heartburn, [ ]  Trouble swallowing Urinary: [ ]  chronic Kidney disease, [ ]  on HD - [ ]  MWF or [ ]  TTHS, [ ]  Burning with urination, [ ]  Difficulty urinating Skin: [ ]  Rashes, [ ]  Wounds Psychological: [ ]  Anxiety, [ ]  Depression   Social History History  Substance Use Topics  . Smoking status: Current Every Day Smoker -- 1.0 packs/day for 35 years    Types: Cigarettes  . Smokeless tobacco: Former Neurosurgeon    Types: Chew     Comment: pt states that he wants to quit  . Alcohol Use: No    Family History Family History  Problem Relation Age of Onset  . Diabetes Mother   . Stroke Mother   . Heart disease Mother   . Kidney disease Father   . Hyperlipidemia Father     No Known Allergies  Current Outpatient Prescriptions  Medication Sig Dispense Refill  . amLODipine (NORVASC) 10 MG tablet Take 10 mg by mouth daily.      Marland Kitchen aspirin 325 MG EC tablet Take 325 mg by mouth daily.        Marland Kitchen b complex-vitamin c-folic acid (NEPHRO-VITE)  0.8 MG TABS Take 0.8 mg by mouth at bedtime.       . calcium acetate (PHOSLO) 667 MG capsule Take 2,668 mg by mouth 3 (three) times daily with meals.        . colchicine 0.6 MG tablet Take 0.6 mg by mouth 2 (two) times daily as needed. For gout      . cyclobenzaprine (FLEXERIL) 5 MG tablet Take 1 tablet (5 mg total) by mouth 2 (two) times daily as needed for muscle spasms.  20 tablet  0  . diphenhydrAMINE (BENADRYL) 25 MG tablet Take 25 mg by mouth every 6 (six) hours as needed. For itching      . esomeprazole (NEXIUM) 40 MG capsule Take 40 mg by mouth every evening.       . gabapentin (NEURONTIN) 100 MG capsule Take 100 mg by mouth at bedtime as needed. For nerve pain      . oxyCODONE (OXY IR/ROXICODONE) 5 MG immediate release tablet Take 5 mg by mouth every 6 (six) hours as needed. For pain      . Sevelamer Carbonate (RENVELA) 2.4 G PACK Take 2.4 g by mouth 3 (three) times daily as needed. To control potassium level       . sodium bicarbonate 650 MG tablet Take 650 mg by mouth 2 (two) times daily.        . sulindac (CLINORIL) 200 MG tablet Take 200 mg by mouth 2 (two) times daily.        Physical Examination  Filed Vitals:   05/03/12 1432  BP: 138/84  Pulse: 87  Resp: 20    Body mass index is 38.22 kg/(m^2).  General:  WDWN in NAD - mod obese Gait: Normal slight limp - uses cane HENT: WNL Eyes: Pupils equal Pulmonary: normal non-labored breathing , without Rales, rhonchi,  wheezing Cardiac: RRR, without  Murmurs, rubs or gallops; No carotid bruits Abdomen: soft, NT, no masses Skin: no rashes, ulcers noted Vascular Exam/Pulses: 1+ DP on right 2+ DP on left Both feet warm with good sensation and motion  Extremities without ischemic changes, no Gangrene , no cellulitis; no open wounds;  Musculoskeletal: no muscle wasting or atrophy  Neurologic: A&O X 3; Appropriate Affect ; SENSATION: normal; MOTOR FUNCTION:  moving all extremities equally. Speech is fluent/normal   ASSESSMENT: [name] is a 57 y.o. male with ESRD in need of left thigh AVGG PLAN: Place AVGG left femoral; poss replace right femoral diatek if it is not functioning well at time of surgery

## 2012-05-04 ENCOUNTER — Other Ambulatory Visit: Payer: Self-pay | Admitting: *Deleted

## 2012-05-17 ENCOUNTER — Encounter (HOSPITAL_COMMUNITY): Payer: Self-pay | Admitting: Surgery

## 2012-05-17 MED ORDER — DEXTROSE 5 % IV SOLN
1.5000 g | INTRAVENOUS | Status: AC
  Administered 2012-05-18: 1.5 g via INTRAVENOUS
  Filled 2012-05-17: qty 1.5

## 2012-05-18 ENCOUNTER — Ambulatory Visit (HOSPITAL_COMMUNITY): Payer: Medicare Other | Admitting: *Deleted

## 2012-05-18 ENCOUNTER — Encounter (HOSPITAL_COMMUNITY)
Admission: RE | Disposition: A | Discharge status: Home / Self Care | Payer: Self-pay | Source: Ambulatory Visit | Attending: Vascular Surgery

## 2012-05-18 ENCOUNTER — Encounter (HOSPITAL_COMMUNITY): Payer: Self-pay | Admitting: Surgery

## 2012-05-18 ENCOUNTER — Ambulatory Visit (HOSPITAL_COMMUNITY): Payer: Medicare Other

## 2012-05-18 ENCOUNTER — Inpatient Hospital Stay (HOSPITAL_COMMUNITY)
Admission: RE | Admit: 2012-05-18 | Discharge: 2012-05-19 | DRG: 673 | Disposition: A | Discharge status: Home / Self Care | Payer: Medicare Other | Source: Ambulatory Visit | Attending: Vascular Surgery | Admitting: Vascular Surgery

## 2012-05-18 ENCOUNTER — Encounter (HOSPITAL_COMMUNITY): Payer: Self-pay | Admitting: *Deleted

## 2012-05-18 DIAGNOSIS — G510 Bell's palsy: Secondary | ICD-10-CM | POA: Diagnosis present

## 2012-05-18 DIAGNOSIS — E119 Type 2 diabetes mellitus without complications: Secondary | ICD-10-CM | POA: Diagnosis present

## 2012-05-18 DIAGNOSIS — N186 End stage renal disease: Secondary | ICD-10-CM

## 2012-05-18 DIAGNOSIS — Z823 Family history of stroke: Secondary | ICD-10-CM

## 2012-05-18 DIAGNOSIS — F17200 Nicotine dependence, unspecified, uncomplicated: Secondary | ICD-10-CM | POA: Diagnosis present

## 2012-05-18 DIAGNOSIS — I120 Hypertensive chronic kidney disease with stage 5 chronic kidney disease or end stage renal disease: Principal | ICD-10-CM | POA: Diagnosis present

## 2012-05-18 DIAGNOSIS — Z7982 Long term (current) use of aspirin: Secondary | ICD-10-CM

## 2012-05-18 DIAGNOSIS — G609 Hereditary and idiopathic neuropathy, unspecified: Secondary | ICD-10-CM | POA: Diagnosis present

## 2012-05-18 DIAGNOSIS — F411 Generalized anxiety disorder: Secondary | ICD-10-CM | POA: Diagnosis present

## 2012-05-18 DIAGNOSIS — G5600 Carpal tunnel syndrome, unspecified upper limb: Secondary | ICD-10-CM | POA: Diagnosis present

## 2012-05-18 DIAGNOSIS — K219 Gastro-esophageal reflux disease without esophagitis: Secondary | ICD-10-CM | POA: Diagnosis present

## 2012-05-18 DIAGNOSIS — Z992 Dependence on renal dialysis: Secondary | ICD-10-CM

## 2012-05-18 DIAGNOSIS — Z79899 Other long term (current) drug therapy: Secondary | ICD-10-CM

## 2012-05-18 DIAGNOSIS — Z8249 Family history of ischemic heart disease and other diseases of the circulatory system: Secondary | ICD-10-CM

## 2012-05-18 DIAGNOSIS — Z833 Family history of diabetes mellitus: Secondary | ICD-10-CM

## 2012-05-18 HISTORY — DX: Carpal tunnel syndrome, unspecified upper limb: G56.00

## 2012-05-18 HISTORY — PX: AV FISTULA PLACEMENT: SHX1204

## 2012-05-18 HISTORY — PX: EXCHANGE OF A DIALYSIS CATHETER: SHX5818

## 2012-05-18 HISTORY — DX: Bell's palsy: G51.0

## 2012-05-18 LAB — POCT I-STAT 4, (NA,K, GLUC, HGB,HCT)
Glucose, Bld: 93 mg/dL (ref 70–99)
Hemoglobin: 14.6 g/dL (ref 13.0–17.0)
Potassium: 6 mEq/L — ABNORMAL HIGH (ref 3.5–5.1)
Sodium: 136 mEq/L (ref 135–145)

## 2012-05-18 LAB — GLUCOSE, CAPILLARY: Glucose-Capillary: 101 mg/dL — ABNORMAL HIGH (ref 70–99)

## 2012-05-18 LAB — SURGICAL PCR SCREEN: MRSA, PCR: NEGATIVE

## 2012-05-18 SURGERY — EXCHANGE OF A DIALYSIS CATHETER
Anesthesia: General | Site: Thigh | Laterality: Right | Wound class: Clean

## 2012-05-18 MED ORDER — PROMETHAZINE HCL 25 MG/ML IJ SOLN: 6.2500 mg | INTRAMUSCULAR | Status: DC | PRN

## 2012-05-18 MED ORDER — LIDOCAINE HCL (CARDIAC) 20 MG/ML IV SOLN
INTRAVENOUS | Status: DC | PRN
  Administered 2012-05-18: 80 mg via INTRAVENOUS

## 2012-05-18 MED ORDER — HYDROMORPHONE HCL PF 1 MG/ML IJ SOLN
0.2500 mg | INTRAMUSCULAR | Status: DC | PRN
  Administered 2012-05-18 (×2): 0.5 mg via INTRAVENOUS

## 2012-05-18 MED ORDER — LABETALOL HCL 5 MG/ML IV SOLN
10.0000 mg | INTRAVENOUS | Status: DC | PRN
  Filled 2012-05-18: qty 4

## 2012-05-18 MED ORDER — MIDAZOLAM HCL 5 MG/5ML IJ SOLN
INTRAMUSCULAR | Status: DC | PRN
  Administered 2012-05-18 (×2): 1 mg via INTRAVENOUS

## 2012-05-18 MED ORDER — OXYCODONE HCL 5 MG PO TABS: 5.0000 mg | ORAL_TABLET | Freq: Once | ORAL | Status: DC | PRN

## 2012-05-18 MED ORDER — CYCLOBENZAPRINE HCL 10 MG PO TABS: 5.0000 mg | ORAL_TABLET | Freq: Two times a day (BID) | ORAL | Status: DC | PRN

## 2012-05-18 MED ORDER — PROPOFOL 10 MG/ML IV BOLUS
INTRAVENOUS | Status: DC | PRN
  Administered 2012-05-18: 150 mg via INTRAVENOUS

## 2012-05-18 MED ORDER — MUPIROCIN 2 % EX OINT
TOPICAL_OINTMENT | Freq: Two times a day (BID) | CUTANEOUS | Status: DC
  Administered 2012-05-18: 1 via NASAL
  Filled 2012-05-18: qty 22

## 2012-05-18 MED ORDER — ONDANSETRON HCL 4 MG/2ML IJ SOLN
INTRAMUSCULAR | Status: DC | PRN
  Administered 2012-05-18: 4 mg via INTRAVENOUS

## 2012-05-18 MED ORDER — SODIUM CHLORIDE 0.9 % IV SOLN
INTRAVENOUS | Status: DC | PRN
  Administered 2012-05-18 (×2): via INTRAVENOUS

## 2012-05-18 MED ORDER — ROCURONIUM BROMIDE 100 MG/10ML IV SOLN
INTRAVENOUS | Status: DC | PRN
  Administered 2012-05-18: 50 mg via INTRAVENOUS

## 2012-05-18 MED ORDER — HYDROMORPHONE HCL PF 1 MG/ML IJ SOLN
INTRAMUSCULAR | Status: AC
  Filled 2012-05-18: qty 1

## 2012-05-18 MED ORDER — DEXTROSE 5 % IV SOLN
1.5000 g | INTRAVENOUS | Status: DC
  Filled 2012-05-18: qty 1.5

## 2012-05-18 MED ORDER — METOPROLOL TARTRATE 1 MG/ML IV SOLN
2.0000 mg | INTRAVENOUS | Status: DC | PRN
  Filled 2012-05-18: qty 5

## 2012-05-18 MED ORDER — OXYCODONE HCL 5 MG PO TABS
5.0000 mg | ORAL_TABLET | ORAL | Status: DC | PRN
  Administered 2012-05-18 – 2012-05-19 (×2): 5 mg via ORAL
  Filled 2012-05-18 (×2): qty 1

## 2012-05-18 MED ORDER — LIDOCAINE-EPINEPHRINE 0.5 %-1:200000 IJ SOLN
INTRAMUSCULAR | Status: AC
  Filled 2012-05-18: qty 1

## 2012-05-18 MED ORDER — COLCHICINE 0.6 MG PO TABS
0.6000 mg | ORAL_TABLET | Freq: Two times a day (BID) | ORAL | Status: DC | PRN
  Filled 2012-05-18: qty 1

## 2012-05-18 MED ORDER — PHENOL 1.4 % MT LIQD
1.0000 | OROMUCOSAL | Status: DC | PRN
  Filled 2012-05-18: qty 177

## 2012-05-18 MED ORDER — MUPIROCIN 2 % EX OINT
TOPICAL_OINTMENT | CUTANEOUS | Status: AC
  Administered 2012-05-18: 1 via NASAL
  Filled 2012-05-18: qty 22

## 2012-05-18 MED ORDER — SODIUM CHLORIDE 0.9 % IR SOLN
Status: DC | PRN
  Administered 2012-05-18: 12:00:00

## 2012-05-18 MED ORDER — CEFUROXIME SODIUM 1.5 G IJ SOLR
1.5000 g | Freq: Two times a day (BID) | INTRAMUSCULAR | Status: DC
  Filled 2012-05-18: qty 1.5

## 2012-05-18 MED ORDER — HYDRALAZINE HCL 20 MG/ML IJ SOLN
10.0000 mg | INTRAMUSCULAR | Status: DC | PRN
  Filled 2012-05-18: qty 0.5

## 2012-05-18 MED ORDER — HEPARIN SODIUM (PORCINE) 1000 UNIT/ML IJ SOLN
INTRAMUSCULAR | Status: AC
  Filled 2012-05-18: qty 1

## 2012-05-18 MED ORDER — ALUM & MAG HYDROXIDE-SIMETH 200-200-20 MG/5ML PO SUSP
15.0000 mL | ORAL | Status: DC | PRN
  Filled 2012-05-18: qty 30

## 2012-05-18 MED ORDER — FENTANYL CITRATE 0.05 MG/ML IJ SOLN
INTRAMUSCULAR | Status: DC | PRN
Start: 2012-05-18 — End: 2012-05-18
  Administered 2012-05-18 (×8): 25 ug via INTRAVENOUS
  Administered 2012-05-18 (×4): 50 ug via INTRAVENOUS

## 2012-05-18 MED ORDER — 0.9 % SODIUM CHLORIDE (POUR BTL) OPTIME
TOPICAL | Status: DC | PRN
  Administered 2012-05-18: 1000 mL

## 2012-05-18 MED ORDER — POTASSIUM CHLORIDE CRYS ER 20 MEQ PO TBCR: 20.0000 meq | EXTENDED_RELEASE_TABLET | Freq: Once | ORAL | Status: DC | PRN

## 2012-05-18 MED ORDER — HEPARIN SODIUM (PORCINE) 1000 UNIT/ML IJ SOLN
INTRAMUSCULAR | Status: DC | PRN
  Administered 2012-05-18: 7 mL

## 2012-05-18 MED ORDER — ALBUTEROL SULFATE HFA 108 (90 BASE) MCG/ACT IN AERS
2.0000 | INHALATION_SPRAY | RESPIRATORY_TRACT | Status: DC
  Filled 2012-05-18: qty 6.7

## 2012-05-18 MED ORDER — ONDANSETRON HCL 4 MG/2ML IJ SOLN: 4.0000 mg | Freq: Four times a day (QID) | INTRAMUSCULAR | Status: DC | PRN

## 2012-05-18 MED ORDER — SODIUM CHLORIDE 0.9 % IV SOLN
10.0000 mg | INTRAVENOUS | Status: DC | PRN
  Administered 2012-05-18 (×2): 20 ug/min via INTRAVENOUS

## 2012-05-18 MED ORDER — SODIUM POLYSTYRENE SULFONATE 15 GM/60ML PO SUSP
30.0000 g | Freq: Every morning | ORAL | Status: DC
  Administered 2012-05-18: 30 g via ORAL
  Filled 2012-05-18: qty 120

## 2012-05-18 MED ORDER — SODIUM POLYSTYRENE SULFONATE 15 GM/60ML PO SUSP: 30.0000 g | Freq: Once | ORAL | Status: DC

## 2012-05-18 MED ORDER — GUAIFENESIN-DM 100-10 MG/5ML PO SYRP
15.0000 mL | ORAL_SOLUTION | ORAL | Status: DC | PRN
  Filled 2012-05-18: qty 15

## 2012-05-18 MED ORDER — GABAPENTIN 100 MG PO CAPS
100.0000 mg | ORAL_CAPSULE | Freq: Every evening | ORAL | Status: DC | PRN
  Administered 2012-05-18: 100 mg via ORAL
  Filled 2012-05-18: qty 1

## 2012-05-18 MED ORDER — HYDROMORPHONE HCL PF 1 MG/ML IJ SOLN: 0.2500 mg | INTRAMUSCULAR | Status: DC | PRN

## 2012-05-18 MED ORDER — AMLODIPINE BESYLATE 10 MG PO TABS
10.0000 mg | ORAL_TABLET | Freq: Every day | ORAL | Status: DC
  Filled 2012-05-18 (×2): qty 1

## 2012-05-18 MED ORDER — ASPIRIN EC 325 MG PO TBEC
325.0000 mg | DELAYED_RELEASE_TABLET | Freq: Every day | ORAL | Status: DC
  Administered 2012-05-18 – 2012-05-19 (×2): 325 mg via ORAL
  Filled 2012-05-18 (×2): qty 1

## 2012-05-18 MED ORDER — SODIUM CHLORIDE 0.9 % IV SOLN
INTRAVENOUS | Status: DC
  Administered 2012-05-18: via INTRAVENOUS

## 2012-05-18 MED ORDER — CALCIUM ACETATE 667 MG PO CAPS
2668.0000 mg | ORAL_CAPSULE | Freq: Three times a day (TID) | ORAL | Status: DC
  Filled 2012-05-18 (×5): qty 4

## 2012-05-18 MED ORDER — SENNOSIDES-DOCUSATE SODIUM 8.6-50 MG PO TABS
1.0000 | ORAL_TABLET | Freq: Every evening | ORAL | Status: DC | PRN
  Filled 2012-05-18: qty 1

## 2012-05-18 MED ORDER — SODIUM BICARBONATE 650 MG PO TABS
650.0000 mg | ORAL_TABLET | Freq: Two times a day (BID) | ORAL | Status: DC
  Administered 2012-05-18 – 2012-05-19 (×2): 650 mg via ORAL
  Filled 2012-05-18 (×3): qty 1

## 2012-05-18 MED ORDER — DIPHENHYDRAMINE HCL 25 MG PO TABS
25.0000 mg | ORAL_TABLET | Freq: Four times a day (QID) | ORAL | Status: DC | PRN
  Filled 2012-05-18: qty 1

## 2012-05-18 MED ORDER — SODIUM CHLORIDE 0.9 % IV SOLN: 500.0000 mL | Freq: Once | INTRAVENOUS | Status: AC | PRN

## 2012-05-18 MED ORDER — OXYCODONE HCL 5 MG/5ML PO SOLN: 5.0000 mg | Freq: Once | ORAL | Status: DC | PRN

## 2012-05-18 MED ORDER — DOPAMINE-DEXTROSE 3.2-5 MG/ML-% IV SOLN
3.0000 ug/kg/min | INTRAVENOUS | Status: DC
  Filled 2012-05-18: qty 250

## 2012-05-18 MED ORDER — ACETAMINOPHEN 650 MG RE SUPP: 325.0000 mg | RECTAL | Status: DC | PRN

## 2012-05-18 MED ORDER — DEXTROSE 5 % IV SOLN: 1.5000 g | INTRAVENOUS | Status: DC

## 2012-05-18 MED ORDER — MEPERIDINE HCL 25 MG/ML IJ SOLN: 6.2500 mg | INTRAMUSCULAR | Status: DC | PRN

## 2012-05-18 MED ORDER — FENTANYL CITRATE 0.05 MG/ML IJ SOLN
INTRAMUSCULAR | Status: DC | PRN
  Administered 2012-05-18 (×2): 50 ug via INTRAVENOUS
  Administered 2012-05-18: 25 ug via INTRAVENOUS

## 2012-05-18 MED ORDER — SODIUM CHLORIDE 0.9 % IV SOLN: INTRAVENOUS | Status: DC

## 2012-05-18 MED ORDER — PANTOPRAZOLE SODIUM 40 MG PO TBEC
40.0000 mg | DELAYED_RELEASE_TABLET | Freq: Every day | ORAL | Status: DC
  Administered 2012-05-19: 40 mg via ORAL
  Filled 2012-05-18: qty 1

## 2012-05-18 MED ORDER — MORPHINE SULFATE 2 MG/ML IJ SOLN: 2.0000 mg | INTRAMUSCULAR | Status: DC | PRN

## 2012-05-18 MED ORDER — SULINDAC 200 MG PO TABS
200.0000 mg | ORAL_TABLET | Freq: Two times a day (BID) | ORAL | Status: DC
  Administered 2012-05-18: 200 mg via ORAL
  Filled 2012-05-18 (×5): qty 1

## 2012-05-18 MED ORDER — ACETAMINOPHEN 325 MG PO TABS: 325.0000 mg | ORAL_TABLET | ORAL | Status: DC | PRN

## 2012-05-18 MED ORDER — DOCUSATE SODIUM 100 MG PO CAPS
100.0000 mg | ORAL_CAPSULE | Freq: Every day | ORAL | Status: DC
  Filled 2012-05-18: qty 1

## 2012-05-18 MED ORDER — PANTOPRAZOLE SODIUM 40 MG PO TBEC: 40.0000 mg | DELAYED_RELEASE_TABLET | Freq: Every day | ORAL | Status: DC

## 2012-05-18 MED ORDER — MAGNESIUM SULFATE 40 MG/ML IJ SOLN
2.0000 g | Freq: Once | INTRAMUSCULAR | Status: AC | PRN
  Filled 2012-05-18: qty 50

## 2012-05-18 SURGICAL SUPPLY — 67 items
BAG DECANTER FOR FLEXI CONT (MISCELLANEOUS) ×3 IMPLANT
BENZOIN TINCTURE PRP APPL 2/3 (GAUZE/BANDAGES/DRESSINGS) ×3 IMPLANT
CANISTER SUCTION 2500CC (MISCELLANEOUS) ×3 IMPLANT
CATH CANNON HEMO 15F 50CM (CATHETERS) ×3 IMPLANT
CATH CANNON HEMO 15FR 19 (HEMODIALYSIS SUPPLIES) IMPLANT
CATH CANNON HEMO 15FR 23CM (HEMODIALYSIS SUPPLIES) IMPLANT
CATH CANNON HEMO 15FR 31CM (HEMODIALYSIS SUPPLIES) IMPLANT
CATH CANNON HEMO 15FR 32CM (HEMODIALYSIS SUPPLIES) IMPLANT
CLIP LIGATING EXTRA MED SLVR (CLIP) ×3 IMPLANT
CLIP LIGATING EXTRA SM BLUE (MISCELLANEOUS) ×3 IMPLANT
CLOTH BEACON ORANGE TIMEOUT ST (SAFETY) ×3 IMPLANT
CLSR STERI-STRIP ANTIMIC 1/2X4 (GAUZE/BANDAGES/DRESSINGS) ×6 IMPLANT
COVER PROBE W GEL 5X96 (DRAPES) IMPLANT
COVER SURGICAL LIGHT HANDLE (MISCELLANEOUS) ×3 IMPLANT
DECANTER SPIKE VIAL GLASS SM (MISCELLANEOUS) ×3 IMPLANT
DRAPE C-ARM 42X72 X-RAY (DRAPES) ×3 IMPLANT
DRAPE CHEST BREAST 15X10 FENES (DRAPES) IMPLANT
DRAPE INCISE IOBAN 66X45 STRL (DRAPES) ×3 IMPLANT
ELECT REM PT RETURN 9FT ADLT (ELECTROSURGICAL) ×3
ELECTRODE REM PT RTRN 9FT ADLT (ELECTROSURGICAL) ×2 IMPLANT
GAUZE SPONGE 2X2 8PLY STRL LF (GAUZE/BANDAGES/DRESSINGS) ×2 IMPLANT
GAUZE SPONGE 4X4 16PLY XRAY LF (GAUZE/BANDAGES/DRESSINGS) ×3 IMPLANT
GEL ULTRASOUND 20GR AQUASONIC (MISCELLANEOUS) ×3 IMPLANT
GLOVE BIOGEL PI IND STRL 6.5 (GLOVE) ×10 IMPLANT
GLOVE BIOGEL PI IND STRL 7.5 (GLOVE) ×4 IMPLANT
GLOVE BIOGEL PI INDICATOR 6.5 (GLOVE) ×5
GLOVE BIOGEL PI INDICATOR 7.5 (GLOVE) ×2
GLOVE ECLIPSE 6.5 STRL STRAW (GLOVE) ×12 IMPLANT
GLOVE SS BIOGEL STRL SZ 7.5 (GLOVE) ×4 IMPLANT
GLOVE SUPERSENSE BIOGEL SZ 7.5 (GLOVE) ×2
GLOVE SURG SS PI 7.5 STRL IVOR (GLOVE) ×3 IMPLANT
GOWN PREVENTION PLUS XLARGE (GOWN DISPOSABLE) ×3 IMPLANT
GOWN STRL NON-REIN LRG LVL3 (GOWN DISPOSABLE) ×21 IMPLANT
GRAFT GORETEX 6X70 (Vascular Products) ×3 IMPLANT
KIT BASIN OR (CUSTOM PROCEDURE TRAY) ×3 IMPLANT
KIT ROOM TURNOVER OR (KITS) ×3 IMPLANT
NEEDLE 18GX1X1/2 (RX/OR ONLY) (NEEDLE) ×3 IMPLANT
NEEDLE 22X1 1/2 (OR ONLY) (NEEDLE) ×3 IMPLANT
NEEDLE HYPO 25GX1X1/2 BEV (NEEDLE) IMPLANT
NS IRRIG 1000ML POUR BTL (IV SOLUTION) ×3 IMPLANT
PACK CV ACCESS (CUSTOM PROCEDURE TRAY) IMPLANT
PACK PERIPHERAL VASCULAR (CUSTOM PROCEDURE TRAY) ×3 IMPLANT
PACK SURGICAL SETUP 50X90 (CUSTOM PROCEDURE TRAY) IMPLANT
PAD ARMBOARD 7.5X6 YLW CONV (MISCELLANEOUS) ×6 IMPLANT
SOAP 2 % CHG 4 OZ (WOUND CARE) IMPLANT
SPONGE GAUZE 2X2 STER 10/PKG (GAUZE/BANDAGES/DRESSINGS) ×1
SPONGE GAUZE 4X4 12PLY (GAUZE/BANDAGES/DRESSINGS) ×3 IMPLANT
STRIP CLOSURE SKIN 1/2X4 (GAUZE/BANDAGES/DRESSINGS) ×3 IMPLANT
SUT ETHILON 3 0 PS 1 (SUTURE) IMPLANT
SUT PROLENE 6 0 CC (SUTURE) ×6 IMPLANT
SUT SILK 2 0 FS (SUTURE) IMPLANT
SUT SILK 2 0 SH (SUTURE) ×3 IMPLANT
SUT VIC AB 2-0 CT1 27 (SUTURE) ×2
SUT VIC AB 2-0 CT1 TAPERPNT 27 (SUTURE) ×4 IMPLANT
SUT VIC AB 3-0 SH 27 (SUTURE) ×3
SUT VIC AB 3-0 SH 27X BRD (SUTURE) ×6 IMPLANT
SUT VICRYL 4-0 PS2 18IN ABS (SUTURE) ×3 IMPLANT
SYR 20CC LL (SYRINGE) ×3 IMPLANT
SYR 30ML LL (SYRINGE) IMPLANT
SYR 5ML LL (SYRINGE) ×6 IMPLANT
SYR CONTROL 10ML LL (SYRINGE) IMPLANT
SYRINGE 10CC LL (SYRINGE) ×3 IMPLANT
TAPE CLOTH SURG 4X10 WHT LF (GAUZE/BANDAGES/DRESSINGS) ×9 IMPLANT
TOWEL OR 17X24 6PK STRL BLUE (TOWEL DISPOSABLE) ×6 IMPLANT
TOWEL OR 17X26 10 PK STRL BLUE (TOWEL DISPOSABLE) ×3 IMPLANT
UNDERPAD 30X30 INCONTINENT (UNDERPADS AND DIAPERS) ×3 IMPLANT
WATER STERILE IRR 1000ML POUR (IV SOLUTION) ×3 IMPLANT

## 2012-05-18 NOTE — Progress Notes (Signed)
Nurse informed Dr. Gypsy Balsam that patients potassium was 6.0. Dr. Gypsy Balsam stated "thats ok."

## 2012-05-18 NOTE — Progress Notes (Signed)
PT SYSTEM CLOTTED WITH 30 MIN LEFT ON HD TX. RAN FOR 1.5 HRS ON 1K, WAS ABLE TO RETURN 125 ML OF BLOOD BACK TO PT. DR. Eliott Nine AWARE AND ORDERS TO TAKE PT TO HOLDING AREA IN OR, AND INFORMED ANGIE, RN THAT A K LEVEL NEEDS TO BE DONE POST SURGERY PLACEMENT OF HD CATH AND AVG. DR. Eliott Nine TO SPEAK TO DR.GASAGEE ABOUT PT.

## 2012-05-18 NOTE — Progress Notes (Signed)
MEDICATION RELATED CONSULT NOTE - INITIAL   Pharmacy Consult for Antibiotic adjustment for ESRD Indication: Post-op Zinacef  No Known Allergies Patient Measurements: Height: 5\' 11"  (180.3 cm) Weight: 257 lb 11.5 oz (116.9 kg) IBW/kg (Calculated) : 75.3  Vital Signs: Temp: 98 F (36.7 C) (01/22 1500) Temp src: Oral (01/22 1041) BP: 98/48 mmHg (01/22 1512) Pulse Rate: 97  (01/22 1515) Intake/Output from previous day:   Intake/Output from this shift: Total I/O In: 500 [I.V.:500] Out: 772 [Other:722; Blood:50] Labs:  [facility] 05/18/12 0656  WBC --  HGB 14.6  HCT 43.0  PLT --  APTT --  CREATININE --  LABCREA --  CREATININE --  CREAT24HRUR --  MG --  PHOS --  ALBUMIN --  PROT --  ALBUMIN --  AST --  ALT --  ALKPHOS --  BILITOT --  BILIDIR --  IBILI --   Estimated Creatinine Clearance: 11.1 ml/min (by C-G formula based on Cr of 9.7).   Microbiology: Recent Results (from the past 720 hour(s))  SURGICAL PCR SCREEN     Status: Normal   Collection Time   05/18/12  7:06 AM      Component Value Range Status Comment   MRSA, PCR NEGATIVE  NEGATIVE Final    Staphylococcus aureus NEGATIVE  NEGATIVE Final     Medical History: Past Medical History  Diagnosis Date  . Anxiety   . Back pain   . GERD (gastroesophageal reflux disease)   . Peripheral neuropathy   . Arthritis   . CHF (congestive heart failure)   . Active smoker   . Dialysis patient     M-W-F  . Renal failure     MWF dialysis at [facility]  . Diabetes mellitus     diet controlled  . 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 PONV (postoperative nausea and vomiting)   . Hypertension   . Bell palsy   . Heart murmur   . Shortness of breath     with ambulation  . Carpal tunnel syndrome     bilateral  . Hemodialysis patient     M,W,F   Assessment: 39 YOM with ESRD (MWF HD) now s/p exchange & replacement of new R-femoral HD cath and L-femoral AV graft to receive Zinacef x2 doses  post-op to prevent infection. Last HD session was this AM. Unsure of plan for next HD session. Afebrile. No CBC today.  Preop dose given at 1146AM. AET 1407PM.  Due to ESRD, will change Zinacef to every 24 hour dosing x2 doses.   Plan:  1. Change Zinacef to 1.5g IV q24h x2 doses- next dose on 1/23 at 1100AM.   Fayne Norrie 05/18/2012,4:31 PM

## 2012-05-18 NOTE — Transfer of Care (Signed)
Immediate Anesthesia Transfer of Care Note  Patient: [name]  Procedure(s) Performed: Procedure(s) (LRB) with comments: EXCHANGE OF A DIALYSIS CATHETER (Right) - right femoral dialysis catheter INSERTION OF ARTERIOVENOUS (AV) GORE-TEX GRAFT THIGH (Left) - using 6mm by 70cm goretex graft  Patient Location: PACU  Anesthesia Type:General  Level of Consciousness: awake, alert  and oriented  Airway & Oxygen Therapy: Patient Spontanous Breathing and Patient connected to face mask oxygen  Post-op Assessment: Report given to PACU RN and Post -op Vital signs reviewed and stable  Post vital signs: Reviewed and stable  Complications: No apparent anesthesia complications

## 2012-05-18 NOTE — H&P (View-Only) (Signed)
[facility] [place] HISTORY AND PHYSICAL   CC: ESRD Referring Physician: Dr. [facility]- M,W,F @ 6 am  History of Present Illness: [name] is a 57 y.o. male who has had multiple dialysis accesses and is now dialyzing through right femoral catheter which is working "ok" at this point he is here today for evaluation for left thigh graft.  Pt states that he has some weakness in both his legs with prolonged standing sec to lumbar disc disease. He denies numbness, tingling or claudication in either leg. He has had a AVGG in right leg without difficulty   No results found.  Past Medical History  Diagnosis Date  . Anxiety   . Back pain   . GERD (gastroesophageal reflux disease)   . Peripheral neuropathy   . Arthritis   . CHF (congestive heart failure)   . Active smoker   . Dialysis patient     M-W-F  . Renal failure     MWF dialysis at [facility]  . Shortness of breath   . Diabetes mellitus     diet controlled  . 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 PONV (postoperative nausea and vomiting)   . Hypertension     ROS: [x] Positive   [ ] Denies    General: [ ] Weight loss, [ ] Fever, [ ] chills Neurologic: [ ] Dizziness, [ ] Blackouts, [ ] Seizure [ ] Stroke, [ ] "Mini stroke", [ ] Slurred speech, [ ] Temporary blindness; [x ] weakness in arms or legs, [ ] Hoarseness Cardiac: [ ] Chest pain/pressure, [ ] Shortness of breath at rest [x ] Shortness of breath with exertion, [ ] Atrial fibrillation or irregular heartbeat Vascular: [ ] Pain in legs with walking, [ ] Pain in legs at rest, [ ] Pain in legs at night,  [ ] Non-healing ulcer, [ ] Blood clot in vein/DVT,   Pulmonary: [ ] Home oxygen, [ ] Productive cough, [ ] Coughing up blood, [ ] Asthma,  [ ] Wheezing Musculoskeletal:  [x ] Arthritis, [x ] Low back pain, [ ] Joint pain Hematologic: [ ] Easy Bruising, [x ] Anemia; [ ] Hepatitis Gastrointestinal: [ ]  Blood in stool, [ ] Gastroesophageal Reflux/heartburn, [ ] Trouble swallowing Urinary: [ ] chronic Kidney disease, [ ] on HD - [ ] MWF or [ ] TTHS, [ ] Burning with urination, [ ] Difficulty urinating Skin: [ ] Rashes, [ ] Wounds Psychological: [ ] Anxiety, [ ] Depression   Social History History  Substance Use Topics  . Smoking status: Current Every Day Smoker -- 1.0 packs/day for 35 years    Types: Cigarettes  . Smokeless tobacco: Former User    Types: Chew     Comment: pt states that he wants to quit  . Alcohol Use: No    Family History Family History  Problem Relation Age of Onset  . Diabetes Mother   . Stroke Mother   . Heart disease Mother   . Kidney disease Father   . Hyperlipidemia Father     No Known Allergies  Current Outpatient Prescriptions  Medication Sig Dispense Refill  . amLODipine (NORVASC) 10 MG tablet Take 10 mg by mouth daily.      . aspirin 325 MG EC tablet Take 325 mg by mouth daily.        . b complex-vitamin c-folic acid (NEPHRO-VITE)   0.8 MG TABS Take 0.8 mg by mouth at bedtime.       . calcium acetate (PHOSLO) 667 MG capsule Take 2,668 mg by mouth 3 (three) times daily with meals.        . colchicine 0.6 MG tablet Take 0.6 mg by mouth 2 (two) times daily as needed. For gout      . cyclobenzaprine (FLEXERIL) 5 MG tablet Take 1 tablet (5 mg total) by mouth 2 (two) times daily as needed for muscle spasms.  20 tablet  0  . diphenhydrAMINE (BENADRYL) 25 MG tablet Take 25 mg by mouth every 6 (six) hours as needed. For itching      . esomeprazole (NEXIUM) 40 MG capsule Take 40 mg by mouth every evening.       . gabapentin (NEURONTIN) 100 MG capsule Take 100 mg by mouth at bedtime as needed. For nerve pain      . oxyCODONE (OXY IR/ROXICODONE) 5 MG immediate release tablet Take 5 mg by mouth every 6 (six) hours as needed. For pain      . Sevelamer Carbonate (RENVELA) 2.4 G PACK Take 2.4 g by mouth 3 (three) times daily as needed. To control potassium level       . sodium bicarbonate 650 MG tablet Take 650 mg by mouth 2 (two) times daily.        . sulindac (CLINORIL) 200 MG tablet Take 200 mg by mouth 2 (two) times daily.        Physical Examination  Filed Vitals:   05/03/12 1432  BP: 138/84  Pulse: 87  Resp: 20    Body mass index is 38.22 kg/(m^2).  General:  WDWN in NAD - mod obese Gait: Normal slight limp - uses cane HENT: WNL Eyes: Pupils equal Pulmonary: normal non-labored breathing , without Rales, rhonchi,  wheezing Cardiac: RRR, without  Murmurs, rubs or gallops; No carotid bruits Abdomen: soft, NT, no masses Skin: no rashes, ulcers noted Vascular Exam/Pulses: 1+ DP on right 2+ DP on left Both feet warm with good sensation and motion  Extremities without ischemic changes, no Gangrene , no cellulitis; no open wounds;  Musculoskeletal: no muscle wasting or atrophy  Neurologic: A&O X 3; Appropriate Affect ; SENSATION: normal; MOTOR FUNCTION:  moving all extremities equally. Speech is fluent/normal   ASSESSMENT: [name] is a 57 y.o. male with ESRD in need of left thigh AVGG PLAN: Place AVGG left femoral; poss replace right femoral diatek if it is not functioning well at time of surgery    

## 2012-05-18 NOTE — Anesthesia Preprocedure Evaluation (Addendum)
Anesthesia Evaluation  Patient identified by MRN, date of birth, ID band Patient awake    Reviewed: Allergy & Precautions, H&P , NPO status , Patient's Chart, lab work & pertinent test results  History of Anesthesia Complications (+) PONV  Airway Mallampati: II TM Distance: >3 FB Neck ROM: Full    Dental  (+) Edentulous Lower, Edentulous Upper and Dental Advisory Given   Pulmonary shortness of breath and with exertion, Current Smoker,    + wheezing      Cardiovascular hypertension, Pt. on medications +CHF + Valvular Problems/Murmurs Rhythm:Regular Rate:Normal     Neuro/Psych    GI/Hepatic GERD-  Medicated and Controlled,(+) Hepatitis -  Endo/Other  diabetesMorbid obesity  Renal/GU ESRF and DialysisRenal disease     Musculoskeletal   Abdominal (+) + obese,   Peds  Hematology   Anesthesia Other Findings   Reproductive/Obstetrics                         Anesthesia Physical Anesthesia Plan  ASA: III  Anesthesia Plan: General   Post-op Pain Management:    Induction: Intravenous  Airway Management Planned: Oral ETT  Additional Equipment:   Intra-op Plan:   Post-operative Plan:   Informed Consent:   Dental advisory given  Plan Discussed with: CRNA, Surgeon and Anesthesiologist  Anesthesia Plan Comments:         Anesthesia Quick Evaluation

## 2012-05-18 NOTE — Preoperative (Signed)
Beta Blockers   Reason not to administer Beta Blockers:Not Applicable 

## 2012-05-18 NOTE — Progress Notes (Signed)
Late Entry: Went into pt room around 2000 to go over discharge teaching, pt stating he no longer has a ride home and "cant get it together." States he feels very dizzy and would like to stay overnight since he is unable to drive himself home. Called Dr Hart Rochester and advised of above, no new orders received. State he will left Dr Arbie Cookey know in AM so pt can be rounded on At approx 2245 pt BP noted to be low (74/52 manually), pt continues to complain of dizziness and weakness. Reviewed MAR, pt has order for Dopamine drip and Norvasc. Called Dr Hart Rochester for clarification. Advised that pt does not need Dopamine, stated that was a post-op order, hold Norvasc, and continue monitoring. Pt still weak but resting comfortably at this time, will continue to assess throughout the night

## 2012-05-18 NOTE — Progress Notes (Signed)
Patient stated that he wanted to discharge - he stated he did not stay over for his last thigh avg and did not want to stay for this one either.  The md's were aware of patient's potassium of 6.0 and kayexalate given.

## 2012-05-18 NOTE — Progress Notes (Addendum)
Called by anesthesia - patient scheduled for a thigh graft and new catheter today.   Currently has femoral permcath.  Had only 2 1/2 hours of dialysis yesterday (signed off early)   Potassium is 6.   We will HD for 2 hours on a 1.0 potassium bath without heparin to make GA for the procedure less hazardous. Brigida Scotti B

## 2012-05-18 NOTE — Anesthesia Postprocedure Evaluation (Signed)
  Anesthesia Post-op Note  Patient: [name]  Procedure(s) Performed: Procedure(s) (LRB) with comments: EXCHANGE OF A DIALYSIS CATHETER (Right) - right femoral dialysis catheter INSERTION OF ARTERIOVENOUS (AV) GORE-TEX GRAFT THIGH (Left) - using 6mm by 70cm goretex graft  Patient Location: PACU  Anesthesia Type:General  Level of Consciousness: awake and oriented  Airway and Oxygen Therapy: Patient Spontanous Breathing  Post-op Pain: mild  Post-op Assessment: Post-op Vital signs reviewed, Patient's Cardiovascular Status Stable and Respiratory Function Stable  Post-op Vital Signs: stable  Complications: No apparent anesthesia complications

## 2012-05-18 NOTE — Op Note (Signed)
OPERATIVE REPORT  DATE OF SURGERY: 05/18/2012  PATIENT: [name], 57 y.o. male MRN: [id number]  DOB: Jun 17, 1955  PRE-OPERATIVE DIAGNOSIS: End-stage renal disease with poorly functioning right femoral dietetic catheter  POST-OPERATIVE DIAGNOSIS:  Same  PROCEDURE:  #1 exchange and replacement of new right femoral hemodialysis catheter #2 left femoral loop AV Gore-Tex graft   SURGEON:  Gretta Began, M.D.  PHYSICIAN ASSISTANT: Collins  ANESTHESIA:  Gen.  EBL: Less than 100 ml  Total I/O In: 75 [I.V.:75] Out: 722 [Other:722]  BLOOD ADMINISTERED: None  DRAINS: None  SPECIMEN: None  COUNTS CORRECT:  YES  PLAN OF CARE: PACU   PATIENT DISPOSITION:  PACU - hemodynamically stable  PROCEDURE DETAILS: The patient was taken to the operating and placed supine position where the area the right and left groins were prepped and draped in usual sterile fashion. Incision was made over the entry on the right groin of the catheter into the common femoral vein. The catheter was exposed and was occluded with a hemostat and was divided distally. A guidewire was passed through the existing catheter and the existing catheter was removed. A dilator and peel-away sheath was passed over the guidewire and a the dilator was removed. A 55 cm hemodialysis catheter was placed over the guidewire and positioned in the level of the proximal inferior vena cava. The peel-away sheath was removed. The catheter was brought through a subcutaneous tunnel. A 2 lm ports were attached in both lumens flushed easily and were locked with 1 per cc heparin. The catheter secured to the skin with a 3-0 nylon stitch and the uterus was closed with a 4-0 subcuticular Vicryl stitch  Attention was then turned to the left groin. The patient is morbidly obese. Incision was made obliquely in the groin and carried down to isolate the femoral artery and common femoral vein at the saphenofemoral junction. The patient had a high femoral  bifurcation above the inguinal ligament. The superficial femoral artery had minimal atherosclerotic change and was of good caliber and was used for inflow into the AV graft. The anterior wall of the common femoral vein was also. 2 separate incisions were made over the distal thigh in a loop configuration tunnel was created. A 6 mm standard wall Gore-Tex graft was brought through the tunnel. A Cooley clamp was used for partial occlusion of the common femoral vein. The vein was opened with an 11 blade and extended longitudinally with Potts scissors. The graft was spatulated and sewn end-to-side to the common femoral vein with a running 6-0 Prolene suture. This was just above the level of the saphenous vein insertion. Clamps removed graft flushed with heparinized and reoccluded. Next the superficial femoral artery was occluded proximal and distally was opened with an 11 blade and sent as well to a Potts scissors. The graft cut to appropriate length and was sewn end-to-side to the superficial femoral artery with a running 6-0 Prolene suture. Clamps removed and excellent thrill was noted. The wounds were irrigated with saline. The groin was closed with several layers of 2-0 Vicryl the subcutaneous tissue. Skin was closed with 30 subpectoral Vicryl stitch. The 2 distal thigh incisions were tunneling were closed with 3-0 Vicryl in the subcutaneous and subcuticular tissue. Sterile dressing was applied and the patient was taken to the recovery room in stable condition   Gretta Began, M.D. 05/18/2012 1:55 PM

## 2012-05-18 NOTE — Interval H&P Note (Signed)
History and Physical Interval Note:  05/18/2012 10:56 AM  [name]  has presented today for surgery, with the diagnosis of ESRD  The various methods of treatment have been discussed with the patient and family. After consideration of risks, benefits and other options for treatment, the patient has consented to  Procedure(s) (LRB) with comments: INSERTION OF ARTERIOVENOUS (AV) GORE-TEX GRAFT THIGH (Left) EXCHANGE OF A DIALYSIS CATHETER (Right) - right femoral dialysis catheter as a surgical intervention .  The patient's history has been reviewed, patient examined, no change in status, stable for surgery.  I have reviewed the patient's chart and labs.  Questions were answered to the patient's satisfaction.     [name]

## 2012-05-18 NOTE — Procedures (Signed)
I have personally attended this patient's dialysis session.   2 hours of HD on 1.0 K bath for K of 6  Using femoral permcath BFR 300    [name] B

## 2012-05-19 LAB — BASIC METABOLIC PANEL
BUN: 54 mg/dL — ABNORMAL HIGH (ref 6–23)
CO2: 21 mEq/L (ref 19–32)
Chloride: 93 mEq/L — ABNORMAL LOW (ref 96–112)
GFR calc Af Amer: 5 mL/min — ABNORMAL LOW (ref >90)
Potassium: 6.7 mEq/L (ref 3.5–5.1)

## 2012-05-19 LAB — CBC
HCT: 42.4 % (ref 39.0–52.0)
Hemoglobin: 14 g/dL (ref 13.0–17.0)
MCHC: 33 g/dL (ref 30.0–36.0)
MCV: 96.6 fL (ref 78.0–100.0)

## 2012-05-19 LAB — GLUCOSE, CAPILLARY: Glucose-Capillary: 89 mg/dL (ref 70–99)

## 2012-05-19 LAB — POTASSIUM
Potassium: 6.7 mEq/L (ref 3.5–5.1)
Potassium: 4.7 mEq/L (ref 3.5–5.1)

## 2012-05-19 MED ORDER — ALTEPLASE 100 MG IV SOLR
7.0000 mg | Freq: Once | INTRAVENOUS | Status: AC
  Administered 2012-05-19: 7 mg
  Filled 2012-05-19: qty 7

## 2012-05-19 NOTE — Progress Notes (Signed)
Received call from lab, K=6.7 (slightly hemolyzed). Lab will recollect to verify. Attempted to reach Dr Dr Hart Rochester with results, no answer on phone number given by answering service [phone]) [phone]. Info passed on to dayshift RN/Allyson

## 2012-05-19 NOTE — Procedures (Signed)
I have personally attended this patient's dialysis session.   He has new fem cath  K 6.7 to repeat pre HD Indicates he will s/o early Plan 0K for 66' then 1K for duration of HD he will stay on. Tight heparin  Myer Bohlman B

## 2012-05-19 NOTE — Progress Notes (Signed)
Renal Progress Patient stayed in hosp overnight after AVG placement BP's soft Potassium 6.7 sl hemolysis (but was 6 last PM so likely not too far off) Refused [facility] he WOULD NOT HD TODAY Have convinced him to HD for at least a couple of hours as no options other than HD or keyexelate He agrees to at least a short treatment but indicates his plan is to sing off early  Will HD for 10' on 0 K and then remainder of whatever he will do on a 1K

## 2012-05-20 ENCOUNTER — Encounter (HOSPITAL_COMMUNITY): Payer: Self-pay | Admitting: Vascular Surgery

## 2012-05-24 NOTE — Discharge Summary (Signed)
[facility] Discharge Summary   Patient ID:  [name] MRN: [id number] DOB/AGE: February 07, 1956 57 y.o.  Admit date: 05/18/2012 Discharge date: 05/24/2012 Date of Surgery: 05/18/2012 Surgeon: Surgeon(s): Larina Earthly, MD  Admission Diagnosis: ESRD  Discharge Diagnoses:  ESRD  Secondary Diagnoses: Past Medical History  Diagnosis Date  . Anxiety   . Back pain   . GERD (gastroesophageal reflux disease)   . Peripheral neuropathy   . Arthritis   . CHF (congestive heart failure)   . Active smoker   . Dialysis patient     M-W-F  . Renal failure     MWF dialysis at [facility]  . Diabetes mellitus     diet controlled  . 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 PONV (postoperative nausea and vomiting)   . Hypertension   . Bell palsy   . Heart murmur   . Shortness of breath     with ambulation  . Carpal tunnel syndrome     bilateral  . Hemodialysis patient     M,W,F    Procedure(s): EXCHANGE OF A DIALYSIS CATHETER INSERTION OF ARTERIOVENOUS (AV) GORE-TEX GRAFT THIGH  Discharged Condition: good  HPI: History of Present Illness: [name] is a 57 y.o. male  who has had multiple dialysis accesses and is now dialyzing through right femoral catheter which is working "ok" at this point he is here today for evaluation for left thigh graft.  Pt states that he has some weakness in both his legs with prolonged standing sec to lumbar disc disease. He denies numbness, tingling or claudication in either leg. He has had a AVGG in right leg without difficulty    Hospital Course:  [name] is a 57 y.o. male is S/P Left Procedure(s): EXCHANGE OF A DIALYSIS CATHETER INSERTION OF ARTERIOVENOUS (AV) GORE-TEX GRAFT THIGH Extubated: POD # 0 and 1 Post-op wounds healing well Pt. Ambulating, voiding and taking PO diet without difficulty. Pt pain controlled with PO pain meds. Labs as below Complications:none  Consults:  Treatment Team:  Sadie Haber, MD  Significant Diagnostic Studies: CBC Lab Results  Component Value Date   WBC 19.8* 05/19/2012   HGB 14.0 05/19/2012   HCT 42.4 05/19/2012   MCV 96.6 05/19/2012   PLT 101* 05/19/2012    BMET    Component Value Date/Time   NA 132* 05/19/2012 0505   K 4.7 05/19/2012 0913   CL 93* 05/19/2012 0505   CO2 21 05/19/2012 0505   GLUCOSE 104* 05/19/2012 0505   BUN 54* 05/19/2012 0505   CREATININE 10.63* 05/19/2012 0505   CALCIUM 7.5* 05/19/2012 0505   GFRNONAA 5* 05/19/2012 0505   GFRAA 5* 05/19/2012 0505   COAG Lab Results  Component Value Date   INR 1.10 08/28/2011   INR 1.08 03/31/2011   INR 0.91 06/09/2010     Disposition:  Discharge to :Home Discharge Orders    Future Orders Please Complete By Expires   Resume previous diet      Driving Restrictions      Comments:   No driving for 48 hourss   Lifting restrictions      Comments:   No lifting for 6 weeks   Call MD for:  temperature >100.5      Call MD for:  redness, tenderness, or signs of infection (pain, swelling, bleeding, redness, odor or green/yellow discharge around incision site)      Call  MD for:  severe or increased pain, loss or decreased feeling  in affected limb(s)         Alon, Mazor  Home Medication Instructions [name]   Printed on:05/24/12 1603  Medication Information                    sodium bicarbonate 650 MG tablet Take 650 mg by mouth 2 (two) times daily.             Sevelamer Carbonate (RENVELA) 2.4 G PACK Take 2.4 g by mouth 3 (three) times daily as needed. To control potassium level           gabapentin (NEURONTIN) 100 MG capsule Take 100 mg by mouth at bedtime as needed. For nerve pain           colchicine 0.6 MG tablet Take 0.6 mg by mouth 2 (two) times daily as needed. For gout           esomeprazole (NEXIUM) 40 MG capsule Take 40 mg by mouth every evening.            aspirin 325 MG EC tablet Take 325 mg by mouth daily.             calcium acetate (PHOSLO) 667 MG  capsule Take 2,668 mg by mouth 3 (three) times daily with meals.             b complex-vitamin c-folic acid (NEPHRO-VITE) 0.8 MG TABS Take 0.8 mg by mouth at bedtime.            sulindac (CLINORIL) 200 MG tablet Take 200 mg by mouth 2 (two) times daily.           oxyCODONE (OXY IR/ROXICODONE) 5 MG immediate release tablet Take 5 mg by mouth every 6 (six) hours as needed. For pain           diphenhydrAMINE (BENADRYL) 25 MG tablet Take 25 mg by mouth every 6 (six) hours as needed. For itching           amLODipine (NORVASC) 10 MG tablet Take 10 mg by mouth daily.           cyclobenzaprine (FLEXERIL) 5 MG tablet Take 1 tablet (5 mg total) by mouth 2 (two) times daily as needed for muscle spasms.            Verbal and written Discharge instructions given to the patient. Wound care per Discharge AVS   Signed: Clinton Gallant [facility] 05/24/2012, 4:03 PM

## 2012-06-28 ENCOUNTER — Other Ambulatory Visit: Payer: Self-pay

## 2012-07-05 ENCOUNTER — Encounter (HOSPITAL_COMMUNITY)
Admission: RE | Admit: 2012-07-05 | Discharge: 2012-07-05 | Disposition: A | Discharge status: Home / Self Care | Payer: Medicare Other | Source: Ambulatory Visit | Attending: Vascular Surgery | Admitting: Vascular Surgery

## 2012-07-05 DIAGNOSIS — I120 Hypertensive chronic kidney disease with stage 5 chronic kidney disease or end stage renal disease: Secondary | ICD-10-CM | POA: Insufficient documentation

## 2012-07-05 DIAGNOSIS — Z452 Encounter for adjustment and management of vascular access device: Secondary | ICD-10-CM | POA: Insufficient documentation

## 2012-07-05 DIAGNOSIS — N186 End stage renal disease: Secondary | ICD-10-CM | POA: Insufficient documentation

## 2012-07-05 NOTE — Progress Notes (Signed)
Discharged home with wife; right groin dressing CDI; no bleed/hematoma, or c/o

## 2012-07-05 NOTE — Progress Notes (Signed)
[facility] SHORT STAY H&P  CC:  Catheter removal   HPI:  This is a 57 y.o. male here for diatek catheter removal.  He had a left femoral loop graft placed as well as exchange of right femoral diatek catheter on 05/18/12 by Dr. Arbie Cookey.  He is here today for catheter removal.  He denies being on any blood thinners.  His thigh graft has been used three times without difficulty.    Past Medical History  Diagnosis Date  . Anxiety   . Back pain   . GERD (gastroesophageal reflux disease)   . Peripheral neuropathy   . Arthritis   . CHF (congestive heart failure)   . Active smoker   . Dialysis patient     M-W-F  . Renal failure     MWF dialysis at [facility]  . Diabetes mellitus     diet controlled  . 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 PONV (postoperative nausea and vomiting)   . Hypertension   . Bell palsy   . Heart murmur   . Shortness of breath     with ambulation  . Carpal tunnel syndrome     bilateral  . Hemodialysis patient     M,W,F    FH:  Non-Contributory  History   Social History  . Marital Status: Single    Spouse Name: N/A    Number of Children: N/A  . Years of Education: N/A   Occupational History  . Not on file.   Social History Main Topics  . Smoking status: Current Every Day Smoker -- 1.50 packs/day for 40 years    Types: Cigarettes  . Smokeless tobacco: Former Neurosurgeon    Types: Chew     Comment: pt states that he wants to quit  . Alcohol Use: No  . Drug Use: No  . Sexually Active: Not on file   Other Topics Concern  . Not on file   Social History Narrative  . No narrative on file    No Known Allergies  Current Outpatient Prescriptions  Medication Sig Dispense Refill  . amLODipine (NORVASC) 10 MG tablet Take 10 mg by mouth daily.      Marland Kitchen aspirin 325 MG EC tablet Take 325 mg by mouth daily.        Marland Kitchen b complex-vitamin c-folic acid (NEPHRO-VITE) 0.8 MG TABS Take 0.8 mg by mouth at bedtime.       . calcium  acetate (PHOSLO) 667 MG capsule Take 2,668 mg by mouth 3 (three) times daily with meals.        . colchicine 0.6 MG tablet Take 0.6 mg by mouth 2 (two) times daily as needed. For gout      . cyclobenzaprine (FLEXERIL) 5 MG tablet Take 1 tablet (5 mg total) by mouth 2 (two) times daily as needed for muscle spasms.  20 tablet  0  . diphenhydrAMINE (BENADRYL) 25 MG tablet Take 25 mg by mouth every 6 (six) hours as needed. For itching      . esomeprazole (NEXIUM) 40 MG capsule Take 40 mg by mouth every evening.       . gabapentin (NEURONTIN) 100 MG capsule Take 100 mg by mouth at bedtime as needed. For nerve pain      . oxyCODONE (OXY IR/ROXICODONE) 5 MG immediate release tablet Take 5 mg by mouth every 6 (six) hours as needed. For pain      .  Sevelamer Carbonate (RENVELA) 2.4 G PACK Take 2.4 g by mouth 3 (three) times daily as needed. To control potassium level      . sodium bicarbonate 650 MG tablet Take 650 mg by mouth 2 (two) times daily.        . sulindac (CLINORIL) 200 MG tablet Take 200 mg by mouth 2 (two) times daily.       No current facility-administered medications for this encounter.    ROS:  See HPI  PHYSICAL EXAM  Filed Vitals:   07/05/12 1130  BP: 127/59  Pulse:   Temp:   Resp:     Gen:  Well developed well nourished HEENT:  normocephalic Heart:  regular Lungs:  Non-labored Abdomen:  obese Extremities:  Left thigh graft with + thrill Skin:  No obvious rashes Neuro:  In tact  Lab/X-ray:  none  Impression: This is a 57 y.o. male here for diatek catheter removal  Plan:  Removal of right femoral diatek catheter  Doreatha Massed, PA-C [facility] [phone] 07/05/2012 11:38 AM

## 2012-07-05 NOTE — Progress Notes (Signed)
[facility] Catheter Removal Procedure Note  Diagnosis: ESRD  Plan:  Remove right femoral diatek catheter  Consent signed:  yes Time out completed:  yes Coumadin:  no PT/INR (if applicable):   Other labs:  Procedure: 1.  Sterile prepping and draping over catheter area 2. 2 ml 2% lidocaine plain instilled at removal site. 3.  right femoral catheter removed in its entirety with cuff in tact. 4.  Complications:  none 5. Tip of catheter sent for culture:  no   Patient tolerated procedure well:  yes Pressure held, no bleeding noted, dressing applied Instructions given to the pt regarding wound care and bleeding.  Other:  Doreatha Massed, PA-C 07/05/2012 11:40 AM

## 2012-08-19 ENCOUNTER — Other Ambulatory Visit (HOSPITAL_COMMUNITY): Payer: Self-pay | Admitting: Nephrology

## 2012-08-19 ENCOUNTER — Ambulatory Visit (HOSPITAL_COMMUNITY)
Admission: RE | Admit: 2012-08-19 | Discharge: 2012-08-19 | Disposition: A | Discharge status: Home / Self Care | Payer: Medicare Other | Source: Ambulatory Visit | Attending: Nephrology | Admitting: Nephrology

## 2012-08-19 DIAGNOSIS — Z992 Dependence on renal dialysis: Secondary | ICD-10-CM | POA: Insufficient documentation

## 2012-08-19 DIAGNOSIS — Y832 Surgical operation with anastomosis, bypass or graft as the cause of abnormal reaction of the patient, or of later complication, without mention of misadventure at the time of the procedure: Secondary | ICD-10-CM | POA: Insufficient documentation

## 2012-08-19 DIAGNOSIS — E119 Type 2 diabetes mellitus without complications: Secondary | ICD-10-CM | POA: Insufficient documentation

## 2012-08-19 DIAGNOSIS — N186 End stage renal disease: Secondary | ICD-10-CM | POA: Insufficient documentation

## 2012-08-19 DIAGNOSIS — Z7982 Long term (current) use of aspirin: Secondary | ICD-10-CM | POA: Insufficient documentation

## 2012-08-19 DIAGNOSIS — I120 Hypertensive chronic kidney disease with stage 5 chronic kidney disease or end stage renal disease: Secondary | ICD-10-CM | POA: Insufficient documentation

## 2012-08-19 DIAGNOSIS — T82898A Other specified complication of vascular prosthetic devices, implants and grafts, initial encounter: Secondary | ICD-10-CM | POA: Insufficient documentation

## 2012-08-19 DIAGNOSIS — I82819 Embolism and thrombosis of superficial veins of unspecified lower extremities: Secondary | ICD-10-CM | POA: Insufficient documentation

## 2012-08-19 DIAGNOSIS — I509 Heart failure, unspecified: Secondary | ICD-10-CM | POA: Insufficient documentation

## 2012-08-19 DIAGNOSIS — R011 Cardiac murmur, unspecified: Secondary | ICD-10-CM | POA: Insufficient documentation

## 2012-08-19 DIAGNOSIS — I739 Peripheral vascular disease, unspecified: Secondary | ICD-10-CM | POA: Insufficient documentation

## 2012-08-19 DIAGNOSIS — F17200 Nicotine dependence, unspecified, uncomplicated: Secondary | ICD-10-CM | POA: Insufficient documentation

## 2012-08-19 DIAGNOSIS — Z79899 Other long term (current) drug therapy: Secondary | ICD-10-CM | POA: Insufficient documentation

## 2012-08-19 LAB — POCT I-STAT 4, (NA,K, GLUC, HGB,HCT)
Glucose, Bld: 86 mg/dL (ref 70–99)
HCT: 48 % (ref 39.0–52.0)
Hemoglobin: 17.3 g/dL — ABNORMAL HIGH (ref 13.0–17.0)
Hemoglobin: 16.3 g/dL (ref 13.0–17.0)
Potassium: 5.6 meq/L — ABNORMAL HIGH (ref 3.5–5.1)
Sodium: 137 meq/L (ref 135–145)

## 2012-08-19 MED ORDER — FENTANYL CITRATE 0.05 MG/ML IJ SOLN
INTRAMUSCULAR | Status: AC
  Filled 2012-08-19: qty 2

## 2012-08-19 MED ORDER — ALTEPLASE 100 MG IV SOLR
4.0000 mg | Freq: Once | INTRAVENOUS | Status: AC
  Administered 2012-08-19: 4 mg
  Filled 2012-08-19: qty 4

## 2012-08-19 MED ORDER — FENTANYL CITRATE 0.05 MG/ML IJ SOLN
INTRAMUSCULAR | Status: AC | PRN
  Administered 2012-08-19: 50 ug via INTRAVENOUS
  Administered 2012-08-19: 25 ug via INTRAVENOUS
  Administered 2012-08-19: 50 ug via INTRAVENOUS
  Administered 2012-08-19: 25 ug via INTRAVENOUS
  Administered 2012-08-19: 50 ug via INTRAVENOUS

## 2012-08-19 MED ORDER — MIDAZOLAM HCL 2 MG/2ML IJ SOLN
INTRAMUSCULAR | Status: AC
  Filled 2012-08-19: qty 6

## 2012-08-19 MED ORDER — IOHEXOL 300 MG/ML  SOLN
100.0000 mL | Freq: Once | INTRAMUSCULAR | Status: AC | PRN
  Administered 2012-08-19: 50 mL via INTRAVENOUS

## 2012-08-19 MED ORDER — HEPARIN SODIUM (PORCINE) 1000 UNIT/ML IJ SOLN
INTRAMUSCULAR | Status: AC | PRN
  Administered 2012-08-19: 3000 [IU] via INTRAVENOUS
  Administered 2012-08-19: 2000 [IU] via INTRAVENOUS

## 2012-08-19 MED ORDER — FENTANYL CITRATE 0.05 MG/ML IJ SOLN
INTRAMUSCULAR | Status: AC
  Filled 2012-08-19: qty 4

## 2012-08-19 MED ORDER — SODIUM POLYSTYRENE SULFONATE 15 GM/60ML PO SUSP
30.0000 g | ORAL | Status: DC
  Filled 2012-08-19: qty 120

## 2012-08-19 MED ORDER — MIDAZOLAM HCL 2 MG/2ML IJ SOLN
INTRAMUSCULAR | Status: AC | PRN
  Administered 2012-08-19: 2 mg via INTRAVENOUS
  Administered 2012-08-19: 1 mg via INTRAVENOUS
  Administered 2012-08-19 (×2): 0.5 mg via INTRAVENOUS

## 2012-08-19 NOTE — ED Notes (Signed)
No further bleeding from graft site for 40 minutes.  Dr Grace Isaac in to see pt.  OK to d/c.

## 2012-08-19 NOTE — H&P (Signed)
[name] is an 57 y.o. male.   Chief Complaint: clotted left thigh dialysis graft HPI: Patient with ESRD and thrombosed left thigh AVG presents today for thrombolysis, possible angioplasty/stenting of graft or placement of new catheter if needed.  Past Medical History  Diagnosis Date  . Anxiety   . Back pain   . GERD (gastroesophageal reflux disease)   . Peripheral neuropathy   . Arthritis   . CHF (congestive heart failure)   . Active smoker   . Dialysis patient     M-W-F  . Renal failure     MWF dialysis at [facility]  . Diabetes mellitus     diet controlled  . 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 PONV (postoperative nausea and vomiting)   . Hypertension   . Bell palsy   . Heart murmur   . Shortness of breath     with ambulation  . Carpal tunnel syndrome     bilateral  . Hemodialysis patient     M,W,F    Past Surgical History  Procedure Laterality Date  . Total hip arthroplasty    . Thyroidectomy    . Tooth extraction    . Mandible fracture surgery    . Dg av dialysis graft declot or    . Insertion of dialysis catheter  03/28/2011    Procedure: INSERTION OF DIALYSIS CATHETER;  Surgeon: Pryor Ochoa, MD;  Location: [facility] OR;  Service: Vascular;  Laterality: Left;  . Av fistula placement  03/31/2011    Procedure: INSERTION OF ARTERIOVENOUS (AV) GORE-TEX GRAFT THIGH;  Surgeon: Sherren Kerns, MD;  Location: [facility] OR;  Service: Vascular;  Laterality: Right;  redo right thigh arteriovenous gortex graft using gore-tex stretch 6mm x 70cm  . Thrombectomy w/ embolectomy  06/02/2011    Procedure: THROMBECTOMY ARTERIOVENOUS GORE-TEX GRAFT;  Surgeon: Chuck Hint, MD;  Location: [facility] OR;  Service: Vascular;  Laterality: Right;  Thrombectomy right thigh arteriovenous gortex graft;  revision by  replacement of large portion of graft with 7mm gore-tex   . Insertion of dialysis catheter  08/28/2011    Procedure: INSERTION OF DIALYSIS CATHETER;  Surgeon: Fransisco Hertz, MD;  Location: [facility] OR;  Service: Vascular;  Laterality: Left;  Atempted Bilateral Internal Jugular, Bilateral Subclavin insertion of 55cm Dialysis Catheter Left Femoral  . Insertion of dialysis catheter  12/15/2011    Procedure: INSERTION OF DIALYSIS CATHETER;  Surgeon: Sherren Kerns, MD;  Location: [facility] OR;  Service: Vascular;  Laterality: Right;  Insertion of Right Femoral Dialysis Catheter  . Exchange of a dialysis catheter  02/26/2012    Procedure: EXCHANGE OF A DIALYSIS CATHETER;  Surgeon: Larina Earthly, MD;  Location: [facility] OR;  Service: Vascular;  Laterality: Right;  . Joint replacement    . Exchange of a dialysis catheter  05/18/2012    Procedure: EXCHANGE OF A DIALYSIS CATHETER;  Surgeon: Larina Earthly, MD;  Location: [facility] OR;  Service: Vascular;  Laterality: Right;  right femoral dialysis catheter  . Av fistula placement  05/18/2012    Procedure: INSERTION OF ARTERIOVENOUS (AV) GORE-TEX GRAFT THIGH;  Surgeon: Larina Earthly, MD;  Location: [facility] OR;  Service: Vascular;  Laterality: Left;  using 6mm by 70cm goretex graft    Family History  Problem Relation Age of Onset  . Diabetes Mother   . Stroke Mother   . Heart disease Mother   . Kidney disease Father   .  Hyperlipidemia Father    Social History:  reports that he has been smoking Cigarettes.  He has a 60 pack-year smoking history. He has quit using smokeless tobacco. His smokeless tobacco use included Chew. He reports that he does not drink alcohol or use illicit drugs.  Allergies: No Known Allergies  Current outpatient prescriptions:amLODipine (NORVASC) 10 MG tablet, Take 10 mg by mouth daily., Disp: , Rfl: ;  aspirin 325 MG EC tablet, Take 325 mg by mouth daily.  , Disp: , Rfl: ;  b complex-vitamin c-folic acid (NEPHRO-VITE) 0.8 MG TABS, Take 0.8 mg by mouth at bedtime. , Disp: , Rfl: ;  calcium acetate (PHOSLO) 667 MG capsule, Take 2,668 mg by mouth 3 (three) times daily with meals.  , Disp: , Rfl:  colchicine 0.6 MG tablet, Take 0.6 mg  by mouth 2 (two) times daily as needed. For gout, Disp: , Rfl: ;  cyclobenzaprine (FLEXERIL) 5 MG tablet, Take 1 tablet (5 mg total) by mouth 2 (two) times daily as needed for muscle spasms., Disp: 20 tablet, Rfl: 0;  diphenhydrAMINE (BENADRYL) 25 MG tablet, Take 25 mg by mouth every 6 (six) hours as needed. For itching, Disp: , Rfl:  esomeprazole (NEXIUM) 40 MG capsule, Take 40 mg by mouth every evening. , Disp: , Rfl: ;  gabapentin (NEURONTIN) 100 MG capsule, Take 100 mg by mouth at bedtime as needed. For nerve pain, Disp: , Rfl: ;  oxyCODONE (OXY IR/ROXICODONE) 5 MG immediate release tablet, Take 5 mg by mouth every 6 (six) hours as needed. For pain, Disp: , Rfl:  Sevelamer Carbonate (RENVELA) 2.4 G PACK, Take 2.4 g by mouth 3 (three) times daily as needed. To control potassium level, Disp: , Rfl: ;  sodium bicarbonate 650 MG tablet, Take 650 mg by mouth 2 (two) times daily.  , Disp: , Rfl: ;  sulindac (CLINORIL) 200 MG tablet, Take 200 mg by mouth 2 (two) times daily., Disp: , Rfl:  Current facility-administered medications:alteplase (ACTIVASE) injection 4 mg, 4 mg, Intracatheter, Once, Simonne Come, MD  No results found for this or any previous visit (from the past 48 hour(s)). No results found.  Review of Systems  Constitutional: Negative for fever and chills.  Respiratory:       Occ cough and dyspnea with exertion  Cardiovascular: Negative for chest pain.  Gastrointestinal: Positive for abdominal pain. Negative for nausea and vomiting.  Musculoskeletal:       Occ back pain  Neurological: Negative for headaches.    Blood pressure 112/69, pulse 53, resp. rate 20, SpO2 98.00%. Physical Exam  Constitutional: He is oriented to person, place, and time. He appears well-developed and well-nourished.  Cardiovascular: Normal rate and regular rhythm.   left thigh AVG with no thrill/bruit  Respiratory: Effort normal.  Sl dim BS rt base, left clear  GI: Soft. Bowel sounds are normal.  Obese; mild  diffuse tenderness  Musculoskeletal: Normal range of motion. He exhibits no edema.  Neurological: He is alert and oriented to person, place, and time.     Assessment/Plan Pt with ESRD and thrombosed left thigh AVG. Plan is for thrombolysis, possible angioplasty/stenting of graft or placement of new catheter if needed. Details of above d/w pt /wife with their understanding and consent.  ALLRED,D KEVIN 08/19/2012, 11:43 AM

## 2012-08-19 NOTE — ED Notes (Signed)
Bleeding again from distal medial stitch.  Pressure held until Dr Kirt Boys  arrival.

## 2012-08-19 NOTE — ED Notes (Signed)
O2 d/c'd.  Sutures being placed.  Procedure done.

## 2012-08-19 NOTE — ED Notes (Signed)
Potassium of 5.6 called to Emelia Loron, PA.  Kayexalate ordered, to go to dialysis tomorrow.

## 2012-08-19 NOTE — Procedures (Signed)
Technically successful declot of left thigh dialysis graft.  Procedure complicated by bleeding at multiple prior dialysis access site necessitating placement of additional swizzle sutures to provide hemostasis.

## 2012-08-19 NOTE — ED Notes (Signed)
Moved to nurses station.  Medial edge of dressing with bright red blood.  Pressure held.  IR techs notified and arrive to hold pressure and redress.

## 2012-08-19 NOTE — ED Notes (Signed)
Pressure dressing applied and bumpers tightened with steri strips.  Redressed with tegaderm.

## 2012-08-19 NOTE — ED Notes (Signed)
Potassium redrawn.

## 2012-08-19 NOTE — ED Notes (Signed)
Pain with suturing, pt c/o back, knee and shoulder pain throughout case.  Quite uncomfortable on table due to chronic issues.

## 2012-08-20 ENCOUNTER — Other Ambulatory Visit (HOSPITAL_COMMUNITY): Payer: Self-pay | Admitting: Interventional Radiology

## 2012-08-20 ENCOUNTER — Ambulatory Visit (HOSPITAL_COMMUNITY)
Admission: RE | Admit: 2012-08-20 | Discharge: 2012-08-20 | Disposition: A | Discharge status: Home / Self Care | Payer: Medicare Other | Source: Ambulatory Visit | Attending: Interventional Radiology | Admitting: Interventional Radiology

## 2012-08-20 ENCOUNTER — Ambulatory Visit (HOSPITAL_COMMUNITY): Admission: EM | Admit: 2012-08-20 | Payer: Medicare Other | Source: Other Acute Inpatient Hospital

## 2012-08-20 ENCOUNTER — Non-Acute Institutional Stay (HOSPITAL_COMMUNITY): Admission: EM | Admit: 2012-08-20 | Payer: Medicare Other | Source: Ambulatory Visit

## 2012-08-20 ENCOUNTER — Other Ambulatory Visit (HOSPITAL_COMMUNITY): Payer: Self-pay | Admitting: Nephrology

## 2012-08-20 ENCOUNTER — Encounter (HOSPITAL_COMMUNITY): Payer: Self-pay

## 2012-08-20 ENCOUNTER — Ambulatory Visit (HOSPITAL_COMMUNITY)
Admission: RE | Admit: 2012-08-20 | Discharge: 2012-08-20 | Disposition: A | Discharge status: Home / Self Care | Payer: Medicare Other | Source: Ambulatory Visit | Attending: Nephrology | Admitting: Nephrology

## 2012-08-20 ENCOUNTER — Inpatient Hospital Stay: Admit: 2012-08-20 | Payer: Self-pay | Admitting: Vascular Surgery

## 2012-08-20 VITALS — BP 124/73 | HR 71 | Temp 98.4°F | Resp 18 | Ht 71.0 in | Wt 286.0 lb

## 2012-08-20 DIAGNOSIS — Y841 Kidney dialysis as the cause of abnormal reaction of the patient, or of later complication, without mention of misadventure at the time of the procedure: Secondary | ICD-10-CM | POA: Insufficient documentation

## 2012-08-20 DIAGNOSIS — N186 End stage renal disease: Secondary | ICD-10-CM

## 2012-08-20 DIAGNOSIS — T82898A Other specified complication of vascular prosthetic devices, implants and grafts, initial encounter: Secondary | ICD-10-CM | POA: Insufficient documentation

## 2012-08-20 LAB — BASIC METABOLIC PANEL
BUN: 61 mg/dL — ABNORMAL HIGH (ref 6–23)
Chloride: 94 mEq/L — ABNORMAL LOW (ref 96–112)
Creatinine, Ser: 11.32 mg/dL — ABNORMAL HIGH (ref 0.50–1.35)
GFR calc Af Amer: 5 mL/min — ABNORMAL LOW (ref >90)
GFR calc non Af Amer: 4 mL/min — ABNORMAL LOW (ref >90)
Glucose, Bld: 81 mg/dL (ref 70–99)

## 2012-08-20 SURGERY — INSERTION OF DIALYSIS CATHETER
Anesthesia: General | Laterality: Right

## 2012-08-20 MED ORDER — MIDAZOLAM HCL 2 MG/2ML IJ SOLN
INTRAMUSCULAR | Status: AC
  Filled 2012-08-20: qty 6

## 2012-08-20 MED ORDER — CEFAZOLIN SODIUM-DEXTROSE 2-3 GM-% IV SOLR: 2.0000 g | Freq: Once | INTRAVENOUS | Status: DC

## 2012-08-20 MED ORDER — HEPARIN SODIUM (PORCINE) 1000 UNIT/ML IJ SOLN
INTRAMUSCULAR | Status: AC
  Filled 2012-08-20: qty 1

## 2012-08-20 MED ORDER — HEPARIN SODIUM (PORCINE) 1000 UNIT/ML IJ SOLN
INTRAMUSCULAR | Status: AC | PRN
  Administered 2012-08-20: 2.8 mL
  Administered 2012-08-20: 2.7 mL

## 2012-08-20 MED ORDER — FENTANYL CITRATE 0.05 MG/ML IJ SOLN
INTRAMUSCULAR | Status: AC | PRN
  Administered 2012-08-20 (×2): 50 ug via INTRAVENOUS
  Administered 2012-08-20: 100 ug via INTRAVENOUS

## 2012-08-20 MED ORDER — SODIUM CHLORIDE 0.9 % IV SOLN
Freq: Once | INTRAVENOUS | Status: DC
Start: 2012-08-20 — End: 2012-08-21

## 2012-08-20 MED ORDER — FENTANYL CITRATE 0.05 MG/ML IJ SOLN
INTRAMUSCULAR | Status: AC
  Filled 2012-08-20: qty 6

## 2012-08-20 MED ORDER — CEFAZOLIN SODIUM-DEXTROSE 2-3 GM-% IV SOLR
INTRAVENOUS | Status: AC
  Administered 2012-08-20: 2000 mg
  Filled 2012-08-20: qty 50

## 2012-08-20 MED ORDER — MIDAZOLAM HCL 2 MG/2ML IJ SOLN
INTRAMUSCULAR | Status: AC | PRN
  Administered 2012-08-20: 1 mg via INTRAVENOUS
  Administered 2012-08-20 (×2): 0.5 mg via INTRAVENOUS
  Administered 2012-08-20: 1 mg via INTRAVENOUS

## 2012-08-20 NOTE — ED Notes (Signed)
Lab called with critical value K 6.5.  Dr. Allena Katz notified.  Will call back with plan of care.

## 2012-08-20 NOTE — ED Notes (Signed)
Dr. Allena Katz called and has made arrangements for [name] to have Hemodialysis tonight on the [facility].  I will instruct pt.

## 2012-08-20 NOTE — ED Notes (Signed)
Dr. Zetta Bills called ordered a STAT BMP to be drawn before pt is discharged.  Blood drawn and sent.

## 2012-08-20 NOTE — H&P (Signed)
[name] is an 57 y.o. male.   Chief Complaint: left thigh graft declot yesterday; clotted this am Now back for dialysis catheter placement Scheduled to see [facility] next week Need access til evaluation and plan HPI: ESRD; CHF; smoker; DM; HTN  Past Medical History  Diagnosis Date  . Anxiety   . Back pain   . GERD (gastroesophageal reflux disease)   . Peripheral neuropathy   . Arthritis   . CHF (congestive heart failure)   . Active smoker   . Dialysis patient     M-W-F  . Renal failure     MWF dialysis at [facility]  . Diabetes mellitus     diet controlled  . 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 PONV (postoperative nausea and vomiting)   . Hypertension   . Bell palsy   . Heart murmur   . Shortness of breath     with ambulation  . Carpal tunnel syndrome     bilateral  . Hemodialysis patient     M,W,F    Past Surgical History  Procedure Laterality Date  . Total hip arthroplasty    . Thyroidectomy    . Tooth extraction    . Mandible fracture surgery    . Dg av dialysis graft declot or    . Insertion of dialysis catheter  03/28/2011    Procedure: INSERTION OF DIALYSIS CATHETER;  Surgeon: Pryor Ochoa, MD;  Location: [facility] OR;  Service: Vascular;  Laterality: Left;  . Av fistula placement  03/31/2011    Procedure: INSERTION OF ARTERIOVENOUS (AV) GORE-TEX GRAFT THIGH;  Surgeon: Sherren Kerns, MD;  Location: [facility] OR;  Service: Vascular;  Laterality: Right;  redo right thigh arteriovenous gortex graft using gore-tex stretch 6mm x 70cm  . Thrombectomy w/ embolectomy  06/02/2011    Procedure: THROMBECTOMY ARTERIOVENOUS GORE-TEX GRAFT;  Surgeon: Chuck Hint, MD;  Location: [facility] OR;  Service: Vascular;  Laterality: Right;  Thrombectomy right thigh arteriovenous gortex graft;  revision by  replacement of large portion of graft with 7mm gore-tex   . Insertion of dialysis catheter  08/28/2011    Procedure: INSERTION OF DIALYSIS CATHETER;  Surgeon: Fransisco Hertz,  MD;  Location: [facility] OR;  Service: Vascular;  Laterality: Left;  Atempted Bilateral Internal Jugular, Bilateral Subclavin insertion of 55cm Dialysis Catheter Left Femoral  . Insertion of dialysis catheter  12/15/2011    Procedure: INSERTION OF DIALYSIS CATHETER;  Surgeon: Sherren Kerns, MD;  Location: [facility] OR;  Service: Vascular;  Laterality: Right;  Insertion of Right Femoral Dialysis Catheter  . Exchange of a dialysis catheter  02/26/2012    Procedure: EXCHANGE OF A DIALYSIS CATHETER;  Surgeon: Larina Earthly, MD;  Location: [facility] OR;  Service: Vascular;  Laterality: Right;  . Joint replacement    . Exchange of a dialysis catheter  05/18/2012    Procedure: EXCHANGE OF A DIALYSIS CATHETER;  Surgeon: Larina Earthly, MD;  Location: [facility] OR;  Service: Vascular;  Laterality: Right;  right femoral dialysis catheter  . Av fistula placement  05/18/2012    Procedure: INSERTION OF ARTERIOVENOUS (AV) GORE-TEX GRAFT THIGH;  Surgeon: Larina Earthly, MD;  Location: [facility] OR;  Service: Vascular;  Laterality: Left;  using 6mm by 70cm goretex graft    Family History  Problem Relation Age of Onset  . Diabetes Mother   . Stroke Mother   . Heart disease Mother   . Kidney  disease Father   . Hyperlipidemia Father    Social History:  reports that he has been smoking Cigarettes.  He has a 60 pack-year smoking history. He has quit using smokeless tobacco. His smokeless tobacco use included Chew. He reports that he does not drink alcohol or use illicit drugs.  Allergies: No Known Allergies   (Not in a hospital admission)  Results for orders placed during the hospital encounter of 08/19/12 (from the past 48 hour(s))  POCT I-STAT 4, (NA,K, GLUC, HGB,HCT)     Status: Abnormal   Collection Time    08/19/12 11:19 AM      Result Value Range   Sodium 136  135 - 145 mEq/L   Potassium 7.5 (*) 3.5 - 5.1 mEq/L   Glucose, Bld 83  70 - 99 mg/dL   HCT 78.2  95.6 - 21.3 %   Hemoglobin 17.3 (*) 13.0 - 17.0 g/dL  POCT I-STAT 4, (NA,K,  GLUC, HGB,HCT)     Status: Abnormal   Collection Time    08/19/12 12:56 PM      Result Value Range   Sodium 137  135 - 145 mEq/L   Potassium 5.6 (*) 3.5 - 5.1 mEq/L   Glucose, Bld 86  70 - 99 mg/dL   HCT 08.6  57.8 - 46.9 %   Hemoglobin 16.3  13.0 - 17.0 g/dL   Ir Pta Venous Left  10/24/5282  Indication: Clotted graft     1.    FISTULALYSIS        2.    ANGIOPLASTY OF VENOUS LIMB AND VENOUS ANASTOMOSIS 3.    ULTRASOUND GUIDANCE FOR VENOUS ACCESS Comparison: None - this is the first intervention on the patient's left thigh dialysis graft.  Medications: Versed 4 mg IV; Fentanyl 175 mcg IV; Heparin 5000 units IV; TPA 4 mg into graft.  Contrast: 50 ml Omnipaque-300  Total Moderate Sedation Time: 70 minutes  Fluoroscopy Time: 12.1 minutes.  Complications: Following lysis of the graft, brisk bleeding occurred at multiple sites of attempted prior dialysis access, necessitating the placement of five additional swizzle sutures.  Technique:  Informed written consent was obtained from the patient after a discussion of the risks, benefits and alternatives to treatment. Questions regarding the procedure were encouraged and answered.  A timeout was performed prior to the initiation of the procedure.  On physical examination, the existing left thigh dialysis graft was negative for palpable pulse or thrill.  The skin overlying the graft was prepped and draped in the usual sterile fashion, and a sterile drape was applied covering the operative field.  Maximum barrier sterile technique with sterile gowns and gloves were used for the procedure.  Under ultrasound guidance, the dialysis graft was accessed directed towards the venous anastomosis with a micropuncture kit after the overlying soft tissues were anesthetized with 1% lidocaine.  An ultrasound image was saved for documentation purposes. Note is made of a minimal amount of fluid surrounding the venous limb of the dialysis graft.  The micropuncture sheath was exchanged  for a 7- [place] vascular sheath over a guidewire.  Over a Benson wire, a Kumpe catheter was advanced centrally and a central venogram was performed.  Pull back venogram was performed with the Kumpe catheter.  Heparin was administered systemically and TPA was administered via the Kumpe catheter throughout near the entirety of the venous limb.  The venous anastomosis and majority of the venous limb was angioplastied with a 7 mm x 4 cm Conquest balloon.  An  additional access was obtained directed towards the arterial anastomoses with a micropuncture kit after the overlying soft tissues anesthetized with 1% lidocaine.  This allowed for placement of a 7-French vascular sheath.  The graft was thrombectomized with several rounds of push-pull mechanical thrombectomy with an occlusion balloon. Flow was restored to the graft as evidenced by restored pulsatility of the graft and brisk blood return from the side arm of the vascular sheath.  Shuntograms were performed.  Repeat balloon angioplasty of a residual mild to moderate narrowing of the venous anastomosis was performed at multiple stations with a 7 mm x 3 cm Conquest balloon.  Given the minimal amount of adherent mural thrombus within the venous limb of the graft, several additional rounds of the mechanical thrombectomy was performed with the occlusion balloon. Completion shuntogram images were obtained.  At this point, the procedure was terminated.  All wires, catheters and sheaths were removed from the patient.  Hemostasis was achieved at both access sites with deployment of a swizzle sutures which will be removed at the patient's next dialysis session.  Following lysis of the graft, brisk bleeding occurred at multiple sites of attempted prior dialysis access, necessitating the placement of five additional swizzle sutures.  Dressings were placed.  The patient tolerated the procedure without immediate postprocedural complication.  Findings:  The existing left thigh AV  graft is thrombosed.  The venous anastomosis and near the entirety of the venous limb was angioplastied to 6 mm diameter.  The graft was successfully thrombectomized using mechanical and pharmacologic means as above.  Initial post lysis shuntogram demonstrates a mild focal narrowing of the venous anastomosis which was successfully treated with angioplasty to 7 mm diameter.  A minimal amount of adherent mural thrombus was noted within the venous limb, and treated with several additional rounds of mechanical thrombectomy with the occlusion balloon.  Completion shuntogram demonstrates mild diffuse irregularity of the venous limb (presumably secondary to multiple recent graft access attempts) without discrete / focal hemodynamically significant narrowing.  Reflux shuntogram demonstrates wide patency of the arterial limb and anastomosis.  The central venous system of the left lower extremities widely patent.  As above, following lysis of the graft, brisk bleeding occurred at multiple sites of attempted prior dialysis access necessitating the placement of 5 additional swizzle sutures to maintain hemostasis.  Impression:  1.  Technically successful left thigh AV graft thrombolysis, though procedure complicated by development of brisk bleeding at multiple sites of attempted prior dialysis access, necessitating the placement of additional swizzle sutures.  2.  Successful angioplasty of the venous anastomosis and majority of the venous limb to 7 mm diameter.  Completion shuntogram demonstrates persistent mild rather diffuse irregularity of the venous limb presumably secondary to multiple recent attempts at graft access.  The venous anastomosis is widely patent.  3.  The arterial anastomosis and arterial limb are widely patent.  4.  The central venous system is widely patent.  Access Management:  This graft would be amenable to future percutaneous intervention as clinically indicated.   Original Report Authenticated By: Tacey Ruiz, MD    Ir Angio Av Shunt Addl Access  08/19/2012  Indication: Clotted graft     1.    FISTULALYSIS        2.    ANGIOPLASTY OF VENOUS LIMB AND VENOUS ANASTOMOSIS 3.    ULTRASOUND GUIDANCE FOR VENOUS ACCESS Comparison: None - this is the first intervention on the patient's left thigh dialysis graft.  Medications: Versed 4 mg  IV; Fentanyl 175 mcg IV; Heparin 5000 units IV; TPA 4 mg into graft.  Contrast: 50 ml Omnipaque-300  Total Moderate Sedation Time: 70 minutes  Fluoroscopy Time: 12.1 minutes.  Complications: Following lysis of the graft, brisk bleeding occurred at multiple sites of attempted prior dialysis access, necessitating the placement of five additional swizzle sutures.  Technique:  Informed written consent was obtained from the patient after a discussion of the risks, benefits and alternatives to treatment. Questions regarding the procedure were encouraged and answered.  A timeout was performed prior to the initiation of the procedure.  On physical examination, the existing left thigh dialysis graft was negative for palpable pulse or thrill.  The skin overlying the graft was prepped and draped in the usual sterile fashion, and a sterile drape was applied covering the operative field.  Maximum barrier sterile technique with sterile gowns and gloves were used for the procedure.  Under ultrasound guidance, the dialysis graft was accessed directed towards the venous anastomosis with a micropuncture kit after the overlying soft tissues were anesthetized with 1% lidocaine.  An ultrasound image was saved for documentation purposes. Note is made of a minimal amount of fluid surrounding the venous limb of the dialysis graft.  The micropuncture sheath was exchanged for a 7- [place] vascular sheath over a guidewire.  Over a Benson wire, a Kumpe catheter was advanced centrally and a central venogram was performed.  Pull back venogram was performed with the Kumpe catheter.  Heparin was administered  systemically and TPA was administered via the Kumpe catheter throughout near the entirety of the venous limb.  The venous anastomosis and majority of the venous limb was angioplastied with a 7 mm x 4 cm Conquest balloon.  An additional access was obtained directed towards the arterial anastomoses with a micropuncture kit after the overlying soft tissues anesthetized with 1% lidocaine.  This allowed for placement of a 7-French vascular sheath.  The graft was thrombectomized with several rounds of push-pull mechanical thrombectomy with an occlusion balloon. Flow was restored to the graft as evidenced by restored pulsatility of the graft and brisk blood return from the side arm of the vascular sheath.  Shuntograms were performed.  Repeat balloon angioplasty of a residual mild to moderate narrowing of the venous anastomosis was performed at multiple stations with a 7 mm x 3 cm Conquest balloon.  Given the minimal amount of adherent mural thrombus within the venous limb of the graft, several additional rounds of the mechanical thrombectomy was performed with the occlusion balloon. Completion shuntogram images were obtained.  At this point, the procedure was terminated.  All wires, catheters and sheaths were removed from the patient.  Hemostasis was achieved at both access sites with deployment of a swizzle sutures which will be removed at the patient's next dialysis session.  Following lysis of the graft, brisk bleeding occurred at multiple sites of attempted prior dialysis access, necessitating the placement of five additional swizzle sutures.  Dressings were placed.  The patient tolerated the procedure without immediate postprocedural complication.  Findings:  The existing left thigh AV graft is thrombosed.  The venous anastomosis and near the entirety of the venous limb was angioplastied to 6 mm diameter.  The graft was successfully thrombectomized using mechanical and pharmacologic means as above.  Initial post lysis  shuntogram demonstrates a mild focal narrowing of the venous anastomosis which was successfully treated with angioplasty to 7 mm diameter.  A minimal amount of adherent mural thrombus was noted within the venous  limb, and treated with several additional rounds of mechanical thrombectomy with the occlusion balloon.  Completion shuntogram demonstrates mild diffuse irregularity of the venous limb (presumably secondary to multiple recent graft access attempts) without discrete / focal hemodynamically significant narrowing.  Reflux shuntogram demonstrates wide patency of the arterial limb and anastomosis.  The central venous system of the left lower extremities widely patent.  As above, following lysis of the graft, brisk bleeding occurred at multiple sites of attempted prior dialysis access necessitating the placement of 5 additional swizzle sutures to maintain hemostasis.  Impression:  1.  Technically successful left thigh AV graft thrombolysis, though procedure complicated by development of brisk bleeding at multiple sites of attempted prior dialysis access, necessitating the placement of additional swizzle sutures.  2.  Successful angioplasty of the venous anastomosis and majority of the venous limb to 7 mm diameter.  Completion shuntogram demonstrates persistent mild rather diffuse irregularity of the venous limb presumably secondary to multiple recent attempts at graft access.  The venous anastomosis is widely patent.  3.  The arterial anastomosis and arterial limb are widely patent.  4.  The central venous system is widely patent.  Access Management:  This graft would be amenable to future percutaneous intervention as clinically indicated.   Original Report Authenticated By: Tacey Ruiz, MD    Ir US Guide Vasc Access Left  08/19/2012  Indication: Clotted graft     1.    FISTULALYSIS        2.    ANGIOPLASTY OF VENOUS LIMB AND VENOUS ANASTOMOSIS 3.    ULTRASOUND GUIDANCE FOR VENOUS ACCESS Comparison: None -  this is the first intervention on the patient's left thigh dialysis graft.  Medications: Versed 4 mg IV; Fentanyl 175 mcg IV; Heparin 5000 units IV; TPA 4 mg into graft.  Contrast: 50 ml Omnipaque-300  Total Moderate Sedation Time: 70 minutes  Fluoroscopy Time: 12.1 minutes.  Complications: Following lysis of the graft, brisk bleeding occurred at multiple sites of attempted prior dialysis access, necessitating the placement of five additional swizzle sutures.  Technique:  Informed written consent was obtained from the patient after a discussion of the risks, benefits and alternatives to treatment. Questions regarding the procedure were encouraged and answered.  A timeout was performed prior to the initiation of the procedure.  On physical examination, the existing left thigh dialysis graft was negative for palpable pulse or thrill.  The skin overlying the graft was prepped and draped in the usual sterile fashion, and a sterile drape was applied covering the operative field.  Maximum barrier sterile technique with sterile gowns and gloves were used for the procedure.  Under ultrasound guidance, the dialysis graft was accessed directed towards the venous anastomosis with a micropuncture kit after the overlying soft tissues were anesthetized with 1% lidocaine.  An ultrasound image was saved for documentation purposes. Note is made of a minimal amount of fluid surrounding the venous limb of the dialysis graft.  The micropuncture sheath was exchanged for a 7- [place] vascular sheath over a guidewire.  Over a Benson wire, a Kumpe catheter was advanced centrally and a central venogram was performed.  Pull back venogram was performed with the Kumpe catheter.  Heparin was administered systemically and TPA was administered via the Kumpe catheter throughout near the entirety of the venous limb.  The venous anastomosis and majority of the venous limb was angioplastied with a 7 mm x 4 cm Conquest balloon.  An additional access  was obtained directed towards  the arterial anastomoses with a micropuncture kit after the overlying soft tissues anesthetized with 1% lidocaine.  This allowed for placement of a 7-French vascular sheath.  The graft was thrombectomized with several rounds of push-pull mechanical thrombectomy with an occlusion balloon. Flow was restored to the graft as evidenced by restored pulsatility of the graft and brisk blood return from the side arm of the vascular sheath.  Shuntograms were performed.  Repeat balloon angioplasty of a residual mild to moderate narrowing of the venous anastomosis was performed at multiple stations with a 7 mm x 3 cm Conquest balloon.  Given the minimal amount of adherent mural thrombus within the venous limb of the graft, several additional rounds of the mechanical thrombectomy was performed with the occlusion balloon. Completion shuntogram images were obtained.  At this point, the procedure was terminated.  All wires, catheters and sheaths were removed from the patient.  Hemostasis was achieved at both access sites with deployment of a swizzle sutures which will be removed at the patient's next dialysis session.  Following lysis of the graft, brisk bleeding occurred at multiple sites of attempted prior dialysis access, necessitating the placement of five additional swizzle sutures.  Dressings were placed.  The patient tolerated the procedure without immediate postprocedural complication.  Findings:  The existing left thigh AV graft is thrombosed.  The venous anastomosis and near the entirety of the venous limb was angioplastied to 6 mm diameter.  The graft was successfully thrombectomized using mechanical and pharmacologic means as above.  Initial post lysis shuntogram demonstrates a mild focal narrowing of the venous anastomosis which was successfully treated with angioplasty to 7 mm diameter.  A minimal amount of adherent mural thrombus was noted within the venous limb, and treated with several  additional rounds of mechanical thrombectomy with the occlusion balloon.  Completion shuntogram demonstrates mild diffuse irregularity of the venous limb (presumably secondary to multiple recent graft access attempts) without discrete / focal hemodynamically significant narrowing.  Reflux shuntogram demonstrates wide patency of the arterial limb and anastomosis.  The central venous system of the left lower extremities widely patent.  As above, following lysis of the graft, brisk bleeding occurred at multiple sites of attempted prior dialysis access necessitating the placement of 5 additional swizzle sutures to maintain hemostasis.  Impression:  1.  Technically successful left thigh AV graft thrombolysis, though procedure complicated by development of brisk bleeding at multiple sites of attempted prior dialysis access, necessitating the placement of additional swizzle sutures.  2.  Successful angioplasty of the venous anastomosis and majority of the venous limb to 7 mm diameter.  Completion shuntogram demonstrates persistent mild rather diffuse irregularity of the venous limb presumably secondary to multiple recent attempts at graft access.  The venous anastomosis is widely patent.  3.  The arterial anastomosis and arterial limb are widely patent.  4.  The central venous system is widely patent.  Access Management:  This graft would be amenable to future percutaneous intervention as clinically indicated.   Original Report Authenticated By: Tacey Ruiz, MD    Ir Declot Left Mod Sed  08/19/2012  Indication: Clotted graft     1.    FISTULALYSIS        2.    ANGIOPLASTY OF VENOUS LIMB AND VENOUS ANASTOMOSIS 3.    ULTRASOUND GUIDANCE FOR VENOUS ACCESS Comparison: None - this is the first intervention on the patient's left thigh dialysis graft.  Medications: Versed 4 mg IV; Fentanyl 175 mcg IV; Heparin 5000 units  IV; TPA 4 mg into graft.  Contrast: 50 ml Omnipaque-300  Total Moderate Sedation Time: 70 minutes   Fluoroscopy Time: 12.1 minutes.  Complications: Following lysis of the graft, brisk bleeding occurred at multiple sites of attempted prior dialysis access, necessitating the placement of five additional swizzle sutures.  Technique:  Informed written consent was obtained from the patient after a discussion of the risks, benefits and alternatives to treatment. Questions regarding the procedure were encouraged and answered.  A timeout was performed prior to the initiation of the procedure.  On physical examination, the existing left thigh dialysis graft was negative for palpable pulse or thrill.  The skin overlying the graft was prepped and draped in the usual sterile fashion, and a sterile drape was applied covering the operative field.  Maximum barrier sterile technique with sterile gowns and gloves were used for the procedure.  Under ultrasound guidance, the dialysis graft was accessed directed towards the venous anastomosis with a micropuncture kit after the overlying soft tissues were anesthetized with 1% lidocaine.  An ultrasound image was saved for documentation purposes. Note is made of a minimal amount of fluid surrounding the venous limb of the dialysis graft.  The micropuncture sheath was exchanged for a 7- [place] vascular sheath over a guidewire.  Over a Benson wire, a Kumpe catheter was advanced centrally and a central venogram was performed.  Pull back venogram was performed with the Kumpe catheter.  Heparin was administered systemically and TPA was administered via the Kumpe catheter throughout near the entirety of the venous limb.  The venous anastomosis and majority of the venous limb was angioplastied with a 7 mm x 4 cm Conquest balloon.  An additional access was obtained directed towards the arterial anastomoses with a micropuncture kit after the overlying soft tissues anesthetized with 1% lidocaine.  This allowed for placement of a 7-French vascular sheath.  The graft was thrombectomized with  several rounds of push-pull mechanical thrombectomy with an occlusion balloon. Flow was restored to the graft as evidenced by restored pulsatility of the graft and brisk blood return from the side arm of the vascular sheath.  Shuntograms were performed.  Repeat balloon angioplasty of a residual mild to moderate narrowing of the venous anastomosis was performed at multiple stations with a 7 mm x 3 cm Conquest balloon.  Given the minimal amount of adherent mural thrombus within the venous limb of the graft, several additional rounds of the mechanical thrombectomy was performed with the occlusion balloon. Completion shuntogram images were obtained.  At this point, the procedure was terminated.  All wires, catheters and sheaths were removed from the patient.  Hemostasis was achieved at both access sites with deployment of a swizzle sutures which will be removed at the patient's next dialysis session.  Following lysis of the graft, brisk bleeding occurred at multiple sites of attempted prior dialysis access, necessitating the placement of five additional swizzle sutures.  Dressings were placed.  The patient tolerated the procedure without immediate postprocedural complication.  Findings:  The existing left thigh AV graft is thrombosed.  The venous anastomosis and near the entirety of the venous limb was angioplastied to 6 mm diameter.  The graft was successfully thrombectomized using mechanical and pharmacologic means as above.  Initial post lysis shuntogram demonstrates a mild focal narrowing of the venous anastomosis which was successfully treated with angioplasty to 7 mm diameter.  A minimal amount of adherent mural thrombus was noted within the venous limb, and treated with several additional rounds of  mechanical thrombectomy with the occlusion balloon.  Completion shuntogram demonstrates mild diffuse irregularity of the venous limb (presumably secondary to multiple recent graft access attempts) without discrete /  focal hemodynamically significant narrowing.  Reflux shuntogram demonstrates wide patency of the arterial limb and anastomosis.  The central venous system of the left lower extremities widely patent.  As above, following lysis of the graft, brisk bleeding occurred at multiple sites of attempted prior dialysis access necessitating the placement of 5 additional swizzle sutures to maintain hemostasis.  Impression:  1.  Technically successful left thigh AV graft thrombolysis, though procedure complicated by development of brisk bleeding at multiple sites of attempted prior dialysis access, necessitating the placement of additional swizzle sutures.  2.  Successful angioplasty of the venous anastomosis and majority of the venous limb to 7 mm diameter.  Completion shuntogram demonstrates persistent mild rather diffuse irregularity of the venous limb presumably secondary to multiple recent attempts at graft access.  The venous anastomosis is widely patent.  3.  The arterial anastomosis and arterial limb are widely patent.  4.  The central venous system is widely patent.  Access Management:  This graft would be amenable to future percutaneous intervention as clinically indicated.   Original Report Authenticated By: Tacey Ruiz, MD     Review of Systems  Constitutional: Negative for fever.  Respiratory: Negative for shortness of breath.   Cardiovascular: Negative for chest pain.  Gastrointestinal: Negative for nausea and vomiting.  Neurological: Positive for weakness. Negative for dizziness and headaches.    There were no vitals taken for this visit. Physical Exam  Constitutional: He is oriented to person, place, and time. He appears well-developed.  Cardiovascular: Normal rate and regular rhythm.   No murmur heard. Respiratory: Effort normal and breath sounds normal. He has no wheezes.  GI: Soft. Bowel sounds are normal.  obese  Musculoskeletal: Normal range of motion.  Uses walker/cane  Neurological:  He is alert and oriented to person, place, and time.  Skin: Skin is warm and dry.  Psychiatric: He has a normal mood and affect. His behavior is normal. Judgment and thought content normal.     Assessment/Plan Left thigh graft thrombolysis 4/25 Clotted this am Now scheduled for HD cath placement Pt aware of procedure benefits and risks and agreeable to proceed Consent signed and in chart  Tari Lecount A 08/20/2012, 11:49 AM

## 2012-08-20 NOTE — ED Notes (Signed)
[name] has refused to have hemodialysis tonight stating, "I have been in the hospital all week.  I just want to go home and relax, sit in my chair and watch [place].  I will be at the dialysis center at 0615 on Monday morning as scheduled.  I have gone as long as a week before without dialysis."  I told [name] and his wife that it was in his best interest to have dialysis tonight with his K being 6.5 and that not going could potentially lead to lethal heart rhythms. He appreciated my concern for his welfare, however he was not going to dialysis tonight.  Dr. Allena Katz and the dialysis unit at [facility] were made aware by me. Pt was discharged to the care of his wife.

## 2012-08-20 NOTE — Procedures (Signed)
Successful placement of tunneled HD catheter with tips terminating within the inferior aspect of the right atrium.   The patient tolerated the procedure well without immediate post procedural complication. The catheter is ready for immediate use.

## 2012-08-22 ENCOUNTER — Other Ambulatory Visit: Payer: Self-pay | Admitting: *Deleted

## 2012-08-23 ENCOUNTER — Telehealth: Payer: Self-pay | Admitting: *Deleted

## 2012-08-23 NOTE — Telephone Encounter (Signed)
Monday 08/22/12:Dr Hart Rochester asked me to set up Mr Sarli for a thrombectomy and revision of left thigh AVG on Wed or Thursday this week. I called the [name] and talked with Lamar Laundry to find out he dialyzes M,W,F. I called Mr Caligiuri to set up the procedure on Thursday 08/25/12. He said he couldn't because he was going to have an extra treatment that day for excess fluid. I called Sonya back and she said she would talk with him. She called me back and said she had left a message with him to go ahead with the procedure Thursday.   I called him this morning and he said he would talk tomorrow about it and the extra dialysis needed with the kidney center. I called Karin Golden at the kidney center who called Mr Greenman. He told her he was tired of having his leg cut on so he wasn't doing it. She said to cancel him for Thursday. I tried to call him several more times but he does not have voice mail and did not answer.

## 2012-08-24 ENCOUNTER — Encounter (HOSPITAL_COMMUNITY): Payer: Self-pay | Admitting: Pharmacy Technician

## 2012-08-24 ENCOUNTER — Encounter (HOSPITAL_COMMUNITY): Payer: Self-pay | Admitting: *Deleted

## 2012-08-24 MED ORDER — DEXTROSE 5 % IV SOLN
1.5000 g | INTRAVENOUS | Status: AC
  Administered 2012-08-25: 1.5 g via INTRAVENOUS
  Filled 2012-08-24: qty 1.5

## 2012-08-24 NOTE — Progress Notes (Signed)
Pt denies SOB, chest pain, and being under the care of a cardiologist. Pt denies having a PCP. Results for STOP- Bang assessment sent to [facility]. Pt advised to take Norvasc with sips the DOS, if needed: Colchicine, Flexeril, and oxycodone but pt stated that he will not take any medications because he doesn't want his pressure to drop.

## 2012-08-25 ENCOUNTER — Encounter (HOSPITAL_COMMUNITY): Payer: Self-pay | Admitting: Certified Registered"

## 2012-08-25 ENCOUNTER — Encounter (HOSPITAL_COMMUNITY): Payer: Self-pay | Admitting: *Deleted

## 2012-08-25 ENCOUNTER — Inpatient Hospital Stay (HOSPITAL_COMMUNITY): Payer: Medicare Other | Admitting: Certified Registered"

## 2012-08-25 ENCOUNTER — Ambulatory Visit (HOSPITAL_COMMUNITY)
Admission: RE | Admit: 2012-08-25 | Discharge: 2012-08-25 | Disposition: A | Discharge status: Home / Self Care | Payer: Medicare Other | Source: Ambulatory Visit | Attending: Vascular Surgery | Admitting: Vascular Surgery

## 2012-08-25 ENCOUNTER — Telehealth: Payer: Self-pay | Admitting: *Deleted

## 2012-08-25 ENCOUNTER — Encounter (HOSPITAL_COMMUNITY)
Admission: RE | Disposition: A | Discharge status: Home / Self Care | Payer: Self-pay | Source: Ambulatory Visit | Attending: Vascular Surgery

## 2012-08-25 ENCOUNTER — Inpatient Hospital Stay (HOSPITAL_COMMUNITY): Payer: Medicare Other

## 2012-08-25 DIAGNOSIS — N186 End stage renal disease: Secondary | ICD-10-CM

## 2012-08-25 DIAGNOSIS — F17200 Nicotine dependence, unspecified, uncomplicated: Secondary | ICD-10-CM | POA: Insufficient documentation

## 2012-08-25 DIAGNOSIS — G709 Myoneural disorder, unspecified: Secondary | ICD-10-CM | POA: Insufficient documentation

## 2012-08-25 DIAGNOSIS — E669 Obesity, unspecified: Secondary | ICD-10-CM | POA: Insufficient documentation

## 2012-08-25 DIAGNOSIS — K219 Gastro-esophageal reflux disease without esophagitis: Secondary | ICD-10-CM | POA: Insufficient documentation

## 2012-08-25 DIAGNOSIS — F411 Generalized anxiety disorder: Secondary | ICD-10-CM | POA: Insufficient documentation

## 2012-08-25 DIAGNOSIS — T82898A Other specified complication of vascular prosthetic devices, implants and grafts, initial encounter: Secondary | ICD-10-CM | POA: Insufficient documentation

## 2012-08-25 DIAGNOSIS — Z8619 Personal history of other infectious and parasitic diseases: Secondary | ICD-10-CM | POA: Insufficient documentation

## 2012-08-25 DIAGNOSIS — I509 Heart failure, unspecified: Secondary | ICD-10-CM | POA: Insufficient documentation

## 2012-08-25 DIAGNOSIS — R011 Cardiac murmur, unspecified: Secondary | ICD-10-CM | POA: Insufficient documentation

## 2012-08-25 DIAGNOSIS — Z6838 Body mass index (BMI) 38.0-38.9, adult: Secondary | ICD-10-CM | POA: Insufficient documentation

## 2012-08-25 DIAGNOSIS — E119 Type 2 diabetes mellitus without complications: Secondary | ICD-10-CM | POA: Insufficient documentation

## 2012-08-25 DIAGNOSIS — M129 Arthropathy, unspecified: Secondary | ICD-10-CM | POA: Insufficient documentation

## 2012-08-25 DIAGNOSIS — I82819 Embolism and thrombosis of superficial veins of unspecified lower extremities: Secondary | ICD-10-CM | POA: Insufficient documentation

## 2012-08-25 DIAGNOSIS — Y832 Surgical operation with anastomosis, bypass or graft as the cause of abnormal reaction of the patient, or of later complication, without mention of misadventure at the time of the procedure: Secondary | ICD-10-CM | POA: Insufficient documentation

## 2012-08-25 DIAGNOSIS — I120 Hypertensive chronic kidney disease with stage 5 chronic kidney disease or end stage renal disease: Secondary | ICD-10-CM | POA: Insufficient documentation

## 2012-08-25 DIAGNOSIS — G609 Hereditary and idiopathic neuropathy, unspecified: Secondary | ICD-10-CM | POA: Insufficient documentation

## 2012-08-25 DIAGNOSIS — Z7901 Long term (current) use of anticoagulants: Secondary | ICD-10-CM | POA: Insufficient documentation

## 2012-08-25 HISTORY — PX: AV FISTULA PLACEMENT: SHX1204

## 2012-08-25 HISTORY — PX: THROMBECTOMY W/ EMBOLECTOMY: SHX2507

## 2012-08-25 HISTORY — DX: Headache: R51

## 2012-08-25 LAB — POCT I-STAT 4, (NA,K, GLUC, HGB,HCT)
Hemoglobin: 15.3 g/dL (ref 13.0–17.0)
Potassium: 4.8 mEq/L (ref 3.5–5.1)

## 2012-08-25 LAB — GLUCOSE, CAPILLARY: Glucose-Capillary: 131 mg/dL — ABNORMAL HIGH (ref 70–99)

## 2012-08-25 SURGERY — THROMBECTOMY ARTERIOVENOUS GORE-TEX GRAFT
Anesthesia: General | Site: Thigh | Laterality: Left | Wound class: Clean

## 2012-08-25 MED ORDER — LIDOCAINE HCL (CARDIAC) 20 MG/ML IV SOLN
INTRAVENOUS | Status: DC | PRN
  Administered 2012-08-25: 30 mg via INTRAVENOUS

## 2012-08-25 MED ORDER — ONDANSETRON HCL 4 MG/2ML IJ SOLN
INTRAMUSCULAR | Status: DC | PRN
  Administered 2012-08-25: 4 mg via INTRAVENOUS

## 2012-08-25 MED ORDER — MUPIROCIN 2 % EX OINT
TOPICAL_OINTMENT | CUTANEOUS | Status: AC
  Administered 2012-08-25: 1 via NASAL
  Filled 2012-08-25: qty 22

## 2012-08-25 MED ORDER — MUPIROCIN 2 % EX OINT
TOPICAL_OINTMENT | Freq: Two times a day (BID) | CUTANEOUS | Status: DC
  Filled 2012-08-25: qty 22

## 2012-08-25 MED ORDER — SODIUM CHLORIDE 0.9 % IR SOLN
Status: DC | PRN
  Administered 2012-08-25: 10:00:00

## 2012-08-25 MED ORDER — MIDAZOLAM HCL 5 MG/5ML IJ SOLN
INTRAMUSCULAR | Status: DC | PRN
  Administered 2012-08-25: 2 mg via INTRAVENOUS

## 2012-08-25 MED ORDER — PROPOFOL 10 MG/ML IV BOLUS
INTRAVENOUS | Status: DC | PRN
  Administered 2012-08-25: 150 mg via INTRAVENOUS
  Administered 2012-08-25: 50 mg via INTRAVENOUS
  Administered 2012-08-25: 40 mg via INTRAVENOUS

## 2012-08-25 MED ORDER — 0.9 % SODIUM CHLORIDE (POUR BTL) OPTIME
TOPICAL | Status: DC | PRN
  Administered 2012-08-25: 1000 mL

## 2012-08-25 MED ORDER — OXYCODONE HCL 5 MG PO TABS: 5.0000 mg | ORAL_TABLET | Freq: Four times a day (QID) | ORAL | Status: DC | PRN

## 2012-08-25 MED ORDER — ESMOLOL HCL 10 MG/ML IV SOLN
INTRAVENOUS | Status: DC | PRN
  Administered 2012-08-25: 30 mg via INTRAVENOUS

## 2012-08-25 MED ORDER — FENTANYL CITRATE 0.05 MG/ML IJ SOLN
INTRAMUSCULAR | Status: DC | PRN
  Administered 2012-08-25: 50 ug via INTRAVENOUS
  Administered 2012-08-25: 100 ug via INTRAVENOUS
  Administered 2012-08-25: 50 ug via INTRAVENOUS

## 2012-08-25 MED ORDER — HYDROMORPHONE HCL PF 1 MG/ML IJ SOLN
0.2500 mg | INTRAMUSCULAR | Status: DC | PRN
  Administered 2012-08-25 (×2): 0.5 mg via INTRAVENOUS

## 2012-08-25 MED ORDER — ACETAMINOPHEN 10 MG/ML IV SOLN: 1000.0000 mg | Freq: Once | INTRAVENOUS | Status: DC | PRN

## 2012-08-25 MED ORDER — ARTIFICIAL TEARS OP OINT
TOPICAL_OINTMENT | OPHTHALMIC | Status: DC | PRN
  Administered 2012-08-25: 1 via OPHTHALMIC

## 2012-08-25 MED ORDER — SODIUM CHLORIDE 0.9 % IV SOLN
INTRAVENOUS | Status: DC
  Administered 2012-08-25: 09:00:00 via INTRAVENOUS

## 2012-08-25 MED ORDER — OXYCODONE HCL 5 MG PO TABS
5.0000 mg | ORAL_TABLET | Freq: Once | ORAL | Status: AC
  Administered 2012-08-25: 5 mg via ORAL

## 2012-08-25 MED ORDER — ONDANSETRON HCL 4 MG/2ML IJ SOLN: 4.0000 mg | Freq: Once | INTRAMUSCULAR | Status: DC | PRN

## 2012-08-25 MED ORDER — SODIUM CHLORIDE 0.9 % IV SOLN: INTRAVENOUS | Status: DC

## 2012-08-25 MED ORDER — HYDROMORPHONE HCL PF 1 MG/ML IJ SOLN
INTRAMUSCULAR | Status: AC
  Filled 2012-08-25: qty 1

## 2012-08-25 MED ORDER — PHENYLEPHRINE HCL 10 MG/ML IJ SOLN
INTRAMUSCULAR | Status: DC | PRN
  Administered 2012-08-25 (×2): 120 ug via INTRAVENOUS
  Administered 2012-08-25: 160 ug via INTRAVENOUS

## 2012-08-25 MED ORDER — SODIUM CHLORIDE 0.9 % IV SOLN
10.0000 mg | INTRAVENOUS | Status: DC | PRN
  Administered 2012-08-25: 50 ug/min via INTRAVENOUS

## 2012-08-25 SURGICAL SUPPLY — 35 items
CANISTER SUCTION 2500CC (MISCELLANEOUS) ×3 IMPLANT
CATH EMB 5FR 80CM (CATHETERS) ×9 IMPLANT
CLIP TI MEDIUM 6 (CLIP) ×3 IMPLANT
CLIP TI WIDE RED SMALL 6 (CLIP) ×3 IMPLANT
CLOTH BEACON ORANGE TIMEOUT ST (SAFETY) ×3 IMPLANT
COVER SURGICAL LIGHT HANDLE (MISCELLANEOUS) ×3 IMPLANT
DERMABOND ADVANCED (GAUZE/BANDAGES/DRESSINGS) ×2
DERMABOND ADVANCED .7 DNX12 (GAUZE/BANDAGES/DRESSINGS) ×4 IMPLANT
ELECT REM PT RETURN 9FT ADLT (ELECTROSURGICAL) ×3
ELECTRODE REM PT RTRN 9FT ADLT (ELECTROSURGICAL) ×2 IMPLANT
GEL ULTRASOUND 20GR AQUASONIC (MISCELLANEOUS) IMPLANT
GLOVE BIO SURGEON STRL SZ 6.5 (GLOVE) ×3 IMPLANT
GLOVE BIOGEL PI IND STRL 6.5 (GLOVE) ×2 IMPLANT
GLOVE BIOGEL PI IND STRL 7.0 (GLOVE) ×2 IMPLANT
GLOVE BIOGEL PI INDICATOR 6.5 (GLOVE) ×1
GLOVE BIOGEL PI INDICATOR 7.0 (GLOVE) ×1
GLOVE SS BIOGEL STRL SZ 7 (GLOVE) ×2 IMPLANT
GLOVE SUPERSENSE BIOGEL SZ 7 (GLOVE) ×1
GLOVE SURG SS PI 7.0 STRL IVOR (GLOVE) ×3 IMPLANT
GOWN STRL NON-REIN LRG LVL3 (GOWN DISPOSABLE) ×6 IMPLANT
GOWN STRL REIN XL XLG (GOWN DISPOSABLE) ×6 IMPLANT
GRAFT GORETEX 6X40 (Vascular Products) ×3 IMPLANT
KIT BASIN OR (CUSTOM PROCEDURE TRAY) ×3 IMPLANT
KIT ROOM TURNOVER OR (KITS) ×3 IMPLANT
NS IRRIG 1000ML POUR BTL (IV SOLUTION) ×3 IMPLANT
PACK CV ACCESS (CUSTOM PROCEDURE TRAY) ×3 IMPLANT
PAD ARMBOARD 7.5X6 YLW CONV (MISCELLANEOUS) ×6 IMPLANT
SPONGE GAUZE 4X4 12PLY (GAUZE/BANDAGES/DRESSINGS) ×3 IMPLANT
SUT PROLENE 6 0 BV (SUTURE) ×3 IMPLANT
SUT VIC AB 3-0 SH 27 (SUTURE) ×1
SUT VIC AB 3-0 SH 27X BRD (SUTURE) ×2 IMPLANT
TOWEL OR 17X24 6PK STRL BLUE (TOWEL DISPOSABLE) ×3 IMPLANT
TOWEL OR 17X26 10 PK STRL BLUE (TOWEL DISPOSABLE) ×3 IMPLANT
UNDERPAD 30X30 INCONTINENT (UNDERPADS AND DIAPERS) ×3 IMPLANT
WATER STERILE IRR 1000ML POUR (IV SOLUTION) ×3 IMPLANT

## 2012-08-25 NOTE — OR Nursing (Signed)
discusssion with dr Noreene Larsson re sbp in both arms +/- 85SBP with stable sats and hr. Pt is not on mitadrine for bp . Toletaes po flds well...Marland Kitchenok to d/c per  joslin mda

## 2012-08-25 NOTE — Telephone Encounter (Signed)
Message copied by Melene Plan on Thu Aug 25, 2012 11:40 AM ------      Message from: Josephina Gip D      Created: Thu Aug 25, 2012 11:04 AM       08/25/2012      Attempted thrombectomy left AV graft with insertion of new left thigh AV graft      Surgeon Dr. Hart Rochester            Message for [name] if this graft occludes in the near future he is not a candidate for further surgical revision. After 4 weeks an attempt by interventional radiology at thrombolyzes could be made to see if stenting of the more proximal vein could be performed but this is not accessible surgically ------

## 2012-08-25 NOTE — Op Note (Signed)
OPERATIVE REPORT  Date of Surgery: 08/25/2012  Surgeon: Josephina Gip, MD  Assistant:  Pre-op Diagnosis: End stage renal disease; clotted arteriovenous graft  Post-op Diagnosis: End stage renal disease; clotted arteriovenous graft.   Procedure: Procedure(s): THROMBECTOMY ARTERIOVENOUS GORE-TEX GRAFT -attempted Insertion new left superficial femoral to common femoral vein AV Gore-Tex graft-6 mm  Anesthesia: Gen.  EBL: Minimal  Complications: None  Procedure Details: Patient was taken to the operating room placed in supine position at which time satisfactory general LMA anesthesia was administered. The left thigh and groin area were prepped with Betadine scrub and solution draped in routine sterile manner. There was a long loop thigh graft which had been placed 3 months earlier and thrombosed following an attempt by interventional radiology to open the graft. They did place 5 sutures from bleeding sites during the procedure. An incision was made just distal to the inguinal crease in both the venous and arterial limbs of the graft were dissected free. There is no evidence of any infection. No heparin was given. Venous end was transected about 5 cm from the previous venous anastomosis. 5 Fogarty catheter would traverse this area but what there was irregularity in the iliac vein proximal to the anastomosis above where we could expose. There was excellent venous back bleeding after thrombectomizing this. Fogarty was passed around the arterial and and had difficulty traversing the arterial anastomosis. Therefore the graft was transected about 4-5 cm distal to the previous anastomosis Fogarty catheter would traverse this and excellent inflow was reestablished. There was severe calcified plaque within the common femoral and superficial femoral arteries. There was excellent flow however. It was decided to replace the entire graft because pass the Fogarty around the existing graft there did seem to be some  narrowing and some areas where the sutures were placed. A new 6 mm Gore-Tex graft was tunneled around the old graft but not as long and was then anastomosed end to end to the arterial and venous ends. Following this there was a pulse and palpable thrill in the graft. There is also good Doppler flow. November nor protamine was given. Wounds were closed in layers of Vicryl in subcuticular fashion Dermabond patient taken to the recovery room in stable condition. He is not a candidate for further revisions of this graft. If the problem is on the venous end possibly interventional radiology could stent this area but I'm not certain that this will lead to long-term patency.   Josephina Gip, MD 08/25/2012 10:57 AM

## 2012-08-25 NOTE — Transfer of Care (Signed)
Immediate Anesthesia Transfer of Care Note  Patient: [name]  Procedure(s) Performed: Procedure(s) with comments: THROMBECTOMY ARTERIOVENOUS GORE-TEX GRAFT (Left) - Attempted thrombectomy of left thigh arteriovenous gortex graft.  INSERTION OF ARTERIOVENOUS (AV) GORE-TEX GRAFT THIGH (Left) - Using 6mm x 40cm vascular Gortex graft.  Patient Location: PACU  Anesthesia Type:General  Level of Consciousness: awake, alert  and oriented  Airway & Oxygen Therapy: Patient Spontanous Breathing and Patient connected to face mask oxygen  Post-op Assessment: Report given to PACU RN  Post vital signs: Reviewed and stable  Complications: No apparent anesthesia complications

## 2012-08-25 NOTE — Anesthesia Procedure Notes (Signed)
Procedure Name: LMA Insertion Date/Time: 08/25/2012 9:18 AM Performed by: Jefm Miles E Pre-anesthesia Checklist: Patient identified, Timeout performed, Emergency Drugs available, Suction available and Patient being monitored Patient Re-evaluated:Patient Re-evaluated prior to inductionOxygen Delivery Method: Circle system utilized Preoxygenation: Pre-oxygenation with 100% oxygen Intubation Type: IV induction Ventilation: Mask ventilation without difficulty LMA: LMA with gastric port inserted LMA Size: 5.0 Number of attempts: 1 Placement Confirmation: positive ETCO2 and breath sounds checked- equal and bilateral Tube secured with: Tape Dental Injury: Teeth and Oropharynx as per pre-operative assessment

## 2012-08-25 NOTE — Anesthesia Preprocedure Evaluation (Addendum)
Anesthesia Evaluation  Patient identified by MRN, date of birth, ID band Patient awake    Reviewed: Allergy & Precautions, H&P , NPO status   Airway Mallampati: II TM Distance: >3 FB     Dental  (+) Edentulous Upper and Edentulous Lower   Pulmonary shortness of breath and with exertion,  breath sounds clear to auscultation        Cardiovascular hypertension, +CHF + Valvular Problems/Murmurs Rhythm:Regular Rate:Normal     Neuro/Psych  Headaches, Anxiety  Neuromuscular disease    GI/Hepatic GERD-  Medicated and Controlled,  Endo/Other  diabetes, Type 2  Renal/GU Renal disease     Musculoskeletal   Abdominal (+) + obese,   Peds  Hematology   Anesthesia Other Findings   Reproductive/Obstetrics                         Anesthesia Physical Anesthesia Plan  ASA: III  Anesthesia Plan: General   Post-op Pain Management:    Induction: Intravenous  Airway Management Planned: LMA  Additional Equipment:   Intra-op Plan:   Post-operative Plan:   Informed Consent: I have reviewed the patients History and Physical, chart, labs and discussed the procedure including the risks, benefits and alternatives for the proposed anesthesia with the patient or authorized representative who has indicated his/her understanding and acceptance.     Plan Discussed with: CRNA and Anesthesiologist  Anesthesia Plan Comments: (ESRD last HD 4/30 Htn Type 2 DM diet controlled Obesity  Plan GA with LMA  Kipp Brood, MD  )        Anesthesia Quick Evaluation

## 2012-08-25 NOTE — Preoperative (Signed)
Beta Blockers   Reason not to administer Beta Blockers:Not Applicable 

## 2012-08-25 NOTE — Telephone Encounter (Signed)
This was faxed to [name] [phone] for their information/jjk

## 2012-08-25 NOTE — H&P (Signed)
Vascular Surgery H&P  Chief Complaint: Clotted left thigh graft with end-stage renal disease HPI: [name] is a 57 y.o. male who presents for evaluation of clotted left thigh graft. This patient had graft placed by Dr. early in January 2014. It worked well for several occasions but didn't clot and was taken to interventional radiology for thrombolysis. Following dilatation of the venous anastomosis there was bleeding from multiple sites which required some sutures being placed by IR and following this the graft thrombosed last week. Patient has a hemodialysis catheter in the right femoral vein and now is scheduled for attempted salvage of left thigh graft   Past Medical History  Diagnosis Date  . Anxiety   . Back pain   . GERD (gastroesophageal reflux disease)   . Peripheral neuropathy   . Arthritis   . CHF (congestive heart failure)   . Active smoker   . Dialysis patient     M-W-F  . Renal failure     MWF dialysis at [facility]  . Diabetes mellitus     diet controlled  . 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 PONV (postoperative nausea and vomiting)   . Hypertension   . Bell palsy   . Heart murmur   . Shortness of breath     with ambulation  . Carpal tunnel syndrome     bilateral  . Hemodialysis patient     M,W,F  . Dysrhythmia     Hx: of palpitataions  . Headache     Hx: of Migraines   Past Surgical History  Procedure Laterality Date  . Total hip arthroplasty    . Thyroidectomy    . Tooth extraction    . Mandible fracture surgery    . Dg av dialysis graft declot or    . Insertion of dialysis catheter  03/28/2011    Procedure: INSERTION OF DIALYSIS CATHETER;  Surgeon: Pryor Ochoa, MD;  Location: [facility] OR;  Service: Vascular;  Laterality: Left;  . Av fistula placement  03/31/2011    Procedure: INSERTION OF ARTERIOVENOUS (AV) GORE-TEX GRAFT THIGH;  Surgeon: Sherren Kerns, MD;  Location: [facility] OR;  Service: Vascular;  Laterality: Right;  redo right  thigh arteriovenous gortex graft using gore-tex stretch 6mm x 70cm  . Thrombectomy w/ embolectomy  06/02/2011    Procedure: THROMBECTOMY ARTERIOVENOUS GORE-TEX GRAFT;  Surgeon: Chuck Hint, MD;  Location: [facility] OR;  Service: Vascular;  Laterality: Right;  Thrombectomy right thigh arteriovenous gortex graft;  revision by  replacement of large portion of graft with 7mm gore-tex   . Insertion of dialysis catheter  08/28/2011    Procedure: INSERTION OF DIALYSIS CATHETER;  Surgeon: Fransisco Hertz, MD;  Location: [facility] OR;  Service: Vascular;  Laterality: Left;  Atempted Bilateral Internal Jugular, Bilateral Subclavin insertion of 55cm Dialysis Catheter Left Femoral  . Insertion of dialysis catheter  12/15/2011    Procedure: INSERTION OF DIALYSIS CATHETER;  Surgeon: Sherren Kerns, MD;  Location: [facility] OR;  Service: Vascular;  Laterality: Right;  Insertion of Right Femoral Dialysis Catheter  . Exchange of a dialysis catheter  02/26/2012    Procedure: EXCHANGE OF A DIALYSIS CATHETER;  Surgeon: Larina Earthly, MD;  Location: [facility] OR;  Service: Vascular;  Laterality: Right;  . Joint replacement    . Exchange of a dialysis catheter  05/18/2012    Procedure: EXCHANGE OF A DIALYSIS CATHETER;  Surgeon: Larina Earthly, MD;  Location: [facility] OR;  Service: Vascular;  Laterality: Right;  right femoral dialysis catheter  . Av fistula placement  05/18/2012    Procedure: INSERTION OF ARTERIOVENOUS (AV) GORE-TEX GRAFT THIGH;  Surgeon: Larina Earthly, MD;  Location: [facility] OR;  Service: Vascular;  Laterality: Left;  using 6mm by 70cm goretex graft   History   Social History  . Marital Status: Single    Spouse Name: N/A    Number of Children: N/A  . Years of Education: N/A   Social History Main Topics  . Smoking status: Current Every Day Smoker -- 1.00 packs/day for 40 years    Types: Cigarettes  . Smokeless tobacco: Never Used     Comment: pt states that he wants to quit  . Alcohol Use: No  . Drug Use: No  . Sexually Active: No    Other Topics Concern  . None   Social History Narrative  . None   Family History  Problem Relation Age of Onset  . Diabetes Mother   . Stroke Mother   . Heart disease Mother   . Kidney disease Father   . Hyperlipidemia Father    No Known Allergies Prior to Admission medications   Medication Sig Start Date End Date Taking? Authorizing Provider  amLODipine (NORVASC) 10 MG tablet Take 10 mg by mouth daily.   Yes Historical Provider, MD  aspirin 325 MG EC tablet Take 325 mg by mouth daily.     Yes Historical Provider, MD  b complex-vitamin c-folic acid (NEPHRO-VITE) 0.8 MG TABS Take 0.8 mg by mouth at bedtime.    Yes Historical Provider, MD  calcium acetate (PHOSLO) 667 MG capsule Take 2,668 mg by mouth 3 (three) times daily with meals.     Yes Historical Provider, MD  colchicine 0.6 MG tablet Take 0.6 mg by mouth 2 (two) times daily as needed. For gout   Yes Historical Provider, MD  cyclobenzaprine (FLEXERIL) 5 MG tablet Take 1 tablet (5 mg total) by mouth 2 (two) times daily as needed for muscle spasms. 04/21/12  Yes Teressa Lower, NP  diphenhydrAMINE (BENADRYL) 25 MG tablet Take 25 mg by mouth every 6 (six) hours as needed. For itching   Yes Historical Provider, MD  esomeprazole (NEXIUM) 40 MG capsule Take 40 mg by mouth every evening.    Yes Historical Provider, MD  gabapentin (NEURONTIN) 100 MG capsule Take 100 mg by mouth at bedtime as needed. For nerve pain   Yes Historical Provider, MD  oxyCODONE (OXY IR/ROXICODONE) 5 MG immediate release tablet Take 5 mg by mouth every 6 (six) hours as needed. For pain 08/26/11  Yes Samantha J Rhyne, PA-C  Sevelamer Carbonate (RENVELA) 2.4 G PACK Take 2.4 g by mouth 3 (three) times daily as needed. To control potassium level   Yes Historical Provider, MD  sodium bicarbonate 650 MG tablet Take 650 mg by mouth 2 (two) times daily.     Yes Historical Provider, MD  sulindac (CLINORIL) 200 MG tablet Take 200 mg by mouth 2 (two) times daily.   Yes  Historical Provider, MD     Positive ROS: Denies active chest pain, hemoptysis, claudication.  All other systems have been reviewed and were otherwise negative with the exception of those mentioned in the HPI and as above.  Physical Exam: Filed Vitals:   08/25/12 0632  BP: 98/62  Pulse: 72  Temp: 97 F (36.1 C)  Resp: 18    General: Alert, no acute distress-obese HEENT: Normal for age  Cardiovascular: Regular rate and rhythm. Carotid pulses 2+, no bruits audible Respiratory: Clear to auscultation. No cyanosis, no use of accessory musculature GI: No organomegaly, abdomen is soft and non-tender Skin: No lesions in the area of chief complaint Neurologic: Sensation intact distally Psychiatric: Patient is competent for consent with normal mood and affect Musculoskeletal: No obvious deformities Extremities: Left thigh graft with no pulse or palpable thrill. 2+ left femoral pulse palpable. No ischemia distally.   Assessment/Plan:  Plan attempted thrombectomy of left thigh graft with revision if indicated. If this re\re thrombosis may need attempt at thigh graft in right lower extremity discussed with the patient and he agrees to proceed   Josephina Gip, MD 08/25/2012 8:59 AM

## 2012-08-29 ENCOUNTER — Encounter (HOSPITAL_COMMUNITY): Payer: Self-pay | Admitting: Vascular Surgery

## 2012-12-01 ENCOUNTER — Other Ambulatory Visit (HOSPITAL_COMMUNITY): Payer: Self-pay | Admitting: Nephrology

## 2012-12-01 DIAGNOSIS — N186 End stage renal disease: Secondary | ICD-10-CM

## 2012-12-06 ENCOUNTER — Other Ambulatory Visit (HOSPITAL_COMMUNITY): Payer: Self-pay | Admitting: Nephrology

## 2012-12-06 ENCOUNTER — Ambulatory Visit (HOSPITAL_COMMUNITY)
Admission: RE | Admit: 2012-12-06 | Discharge: 2012-12-06 | Disposition: A | Discharge status: Home / Self Care | Payer: Medicare Other | Source: Ambulatory Visit | Attending: Nephrology | Admitting: Nephrology

## 2012-12-06 DIAGNOSIS — N186 End stage renal disease: Secondary | ICD-10-CM | POA: Insufficient documentation

## 2012-12-06 DIAGNOSIS — Z452 Encounter for adjustment and management of vascular access device: Secondary | ICD-10-CM | POA: Insufficient documentation

## 2012-12-06 MED ORDER — CHLORHEXIDINE GLUCONATE 4 % EX LIQD
CUTANEOUS | Status: AC
  Filled 2012-12-06: qty 45

## 2012-12-06 NOTE — Procedures (Signed)
Patient with patent dialysis graft and tunneled catheter no longer needed. Successful removal of tunneled CV catheter in right femoral vein. No immediate complications.  Pattricia Boss PA-C Interventional Radiology  12/06/12 10:13 AM

## 2012-12-06 NOTE — Progress Notes (Signed)
R groin dressing c/d/i denies pain.  Escorted via w/c to entrance with family

## 2012-12-14 ENCOUNTER — Inpatient Hospital Stay: Admit: 2012-12-14 | Payer: Self-pay | Admitting: Vascular Surgery

## 2012-12-14 ENCOUNTER — Encounter (HOSPITAL_COMMUNITY): Payer: Self-pay | Admitting: Anesthesiology

## 2012-12-14 ENCOUNTER — Encounter (HOSPITAL_COMMUNITY): Payer: Self-pay | Admitting: *Deleted

## 2012-12-14 ENCOUNTER — Encounter (HOSPITAL_COMMUNITY)
Admission: EM | Disposition: A | Discharge status: Home / Self Care | Payer: Self-pay | Source: Home / Self Care | Attending: Emergency Medicine

## 2012-12-14 ENCOUNTER — Emergency Department (HOSPITAL_COMMUNITY)
Admission: EM | Admit: 2012-12-14 | Discharge: 2012-12-14 | Disposition: A | Discharge status: Home / Self Care | Payer: Medicare Other | Attending: Emergency Medicine | Admitting: Emergency Medicine

## 2012-12-14 ENCOUNTER — Emergency Department (HOSPITAL_COMMUNITY): Payer: Medicare Other | Admitting: Anesthesiology

## 2012-12-14 DIAGNOSIS — Z992 Dependence on renal dialysis: Secondary | ICD-10-CM | POA: Insufficient documentation

## 2012-12-14 DIAGNOSIS — T82898A Other specified complication of vascular prosthetic devices, implants and grafts, initial encounter: Secondary | ICD-10-CM

## 2012-12-14 DIAGNOSIS — E119 Type 2 diabetes mellitus without complications: Secondary | ICD-10-CM | POA: Insufficient documentation

## 2012-12-14 DIAGNOSIS — Y832 Surgical operation with anastomosis, bypass or graft as the cause of abnormal reaction of the patient, or of later complication, without mention of misadventure at the time of the procedure: Secondary | ICD-10-CM | POA: Insufficient documentation

## 2012-12-14 DIAGNOSIS — N186 End stage renal disease: Secondary | ICD-10-CM | POA: Insufficient documentation

## 2012-12-14 DIAGNOSIS — J4489 Other specified chronic obstructive pulmonary disease: Secondary | ICD-10-CM | POA: Insufficient documentation

## 2012-12-14 DIAGNOSIS — I120 Hypertensive chronic kidney disease with stage 5 chronic kidney disease or end stage renal disease: Secondary | ICD-10-CM | POA: Insufficient documentation

## 2012-12-14 DIAGNOSIS — J449 Chronic obstructive pulmonary disease, unspecified: Secondary | ICD-10-CM | POA: Insufficient documentation

## 2012-12-14 DIAGNOSIS — T829XXA Unspecified complication of cardiac and vascular prosthetic device, implant and graft, initial encounter: Secondary | ICD-10-CM

## 2012-12-14 DIAGNOSIS — I509 Heart failure, unspecified: Secondary | ICD-10-CM | POA: Insufficient documentation

## 2012-12-14 HISTORY — PX: REVISION OF ARTERIOVENOUS GORETEX GRAFT: SHX6073

## 2012-12-14 LAB — CBC WITH DIFFERENTIAL/PLATELET
Basophils Absolute: 0 10*3/uL (ref 0.0–0.1)
Eosinophils Absolute: 0.3 10*3/uL (ref 0.0–0.7)
Eosinophils Relative: 3 % (ref 0–5)
MCH: 31.7 pg (ref 26.0–34.0)
MCV: 92.8 fL (ref 78.0–100.0)
Platelets: 159 10*3/uL (ref 150–400)
RDW: 15.1 % (ref 11.5–15.5)
WBC: 10.1 10*3/uL (ref 4.0–10.5)

## 2012-12-14 LAB — BASIC METABOLIC PANEL
Calcium: 11.2 mg/dL — ABNORMAL HIGH (ref 8.4–10.5)
GFR calc non Af Amer: 6 mL/min — ABNORMAL LOW (ref >90)
Sodium: 135 mEq/L (ref 135–145)

## 2012-12-14 LAB — GLUCOSE, CAPILLARY
Glucose-Capillary: 113 mg/dL — ABNORMAL HIGH (ref 70–99)
Glucose-Capillary: 98 mg/dL (ref 70–99)

## 2012-12-14 SURGERY — REVISION OF ARTERIOVENOUS GORETEX GRAFT
Anesthesia: General | Site: Thigh | Laterality: Left | Wound class: Dirty or Infected

## 2012-12-14 MED ORDER — LIDOCAINE HCL (CARDIAC) 20 MG/ML IV SOLN
INTRAVENOUS | Status: DC | PRN
  Administered 2012-12-14: 100 mg via INTRAVENOUS

## 2012-12-14 MED ORDER — NEOSTIGMINE METHYLSULFATE 1 MG/ML IJ SOLN
INTRAMUSCULAR | Status: DC | PRN
  Administered 2012-12-14: 2 mg via INTRAVENOUS

## 2012-12-14 MED ORDER — GLYCOPYRROLATE 0.2 MG/ML IJ SOLN
INTRAMUSCULAR | Status: DC | PRN
  Administered 2012-12-14: 0.2 mg via INTRAVENOUS

## 2012-12-14 MED ORDER — SODIUM POLYSTYRENE SULFONATE 15 GM/60ML PO SUSP
30.0000 g | Freq: Once | ORAL | Status: DC
  Filled 2012-12-14: qty 120

## 2012-12-14 MED ORDER — ONDANSETRON HCL 4 MG/2ML IJ SOLN
INTRAMUSCULAR | Status: DC | PRN
  Administered 2012-12-14: 4 mg via INTRAVENOUS

## 2012-12-14 MED ORDER — SODIUM CHLORIDE 0.9 % IV SOLN
INTRAVENOUS | Status: DC
  Administered 2012-12-14: 12:00:00 via INTRAVENOUS

## 2012-12-14 MED ORDER — OXYCODONE-ACETAMINOPHEN 5-325 MG PO TABS
1.0000 | ORAL_TABLET | Freq: Once | ORAL | Status: AC
  Administered 2012-12-14: 2 via ORAL

## 2012-12-14 MED ORDER — PROMETHAZINE HCL 25 MG/ML IJ SOLN: 6.2500 mg | INTRAMUSCULAR | Status: DC | PRN

## 2012-12-14 MED ORDER — FENTANYL CITRATE 0.05 MG/ML IJ SOLN
INTRAMUSCULAR | Status: AC
  Filled 2012-12-14: qty 2

## 2012-12-14 MED ORDER — SODIUM CHLORIDE 0.9 % IR SOLN
Status: DC | PRN
  Administered 2012-12-14: 12:00:00

## 2012-12-14 MED ORDER — FENTANYL CITRATE 0.05 MG/ML IJ SOLN
INTRAMUSCULAR | Status: DC | PRN
  Administered 2012-12-14: 100 ug via INTRAVENOUS

## 2012-12-14 MED ORDER — DEXTROSE 5 % IV SOLN
1.5000 g | INTRAVENOUS | Status: AC
  Administered 2012-12-14: 1.5 g via INTRAVENOUS
  Filled 2012-12-14: qty 1.5

## 2012-12-14 MED ORDER — FENTANYL CITRATE 0.05 MG/ML IJ SOLN
25.0000 ug | INTRAMUSCULAR | Status: DC | PRN
  Administered 2012-12-14 (×2): 50 ug via INTRAVENOUS

## 2012-12-14 MED ORDER — ROCURONIUM BROMIDE 100 MG/10ML IV SOLN
INTRAVENOUS | Status: DC | PRN
  Administered 2012-12-14: 20 mg via INTRAVENOUS

## 2012-12-14 MED ORDER — OXYCODONE HCL 5 MG/5ML PO SOLN: 5.0000 mg | Freq: Once | ORAL | Status: AC | PRN

## 2012-12-14 MED ORDER — OXYCODONE-ACETAMINOPHEN 5-325 MG PO TABS: 1.0000 | ORAL_TABLET | ORAL | Status: DC | PRN

## 2012-12-14 MED ORDER — PROPOFOL 10 MG/ML IV BOLUS
INTRAVENOUS | Status: DC | PRN
  Administered 2012-12-14: 200 mg via INTRAVENOUS

## 2012-12-14 MED ORDER — OXYCODONE HCL 5 MG PO TABS
5.0000 mg | ORAL_TABLET | Freq: Once | ORAL | Status: AC | PRN
  Administered 2012-12-14: 5 mg via ORAL

## 2012-12-14 MED ORDER — SUCCINYLCHOLINE CHLORIDE 20 MG/ML IJ SOLN
INTRAMUSCULAR | Status: DC | PRN
  Administered 2012-12-14: 100 mg via INTRAVENOUS

## 2012-12-14 MED ORDER — OXYCODONE HCL 5 MG PO TABS
ORAL_TABLET | ORAL | Status: AC
  Filled 2012-12-14: qty 1

## 2012-12-14 MED ORDER — 0.9 % SODIUM CHLORIDE (POUR BTL) OPTIME
TOPICAL | Status: DC | PRN
  Administered 2012-12-14: 1000 mL

## 2012-12-14 SURGICAL SUPPLY — 35 items
BENZOIN TINCTURE PRP APPL 2/3 (GAUZE/BANDAGES/DRESSINGS) ×2 IMPLANT
CANISTER SUCTION 2500CC (MISCELLANEOUS) ×2 IMPLANT
CLIP LIGATING EXTRA MED SLVR (CLIP) ×2 IMPLANT
CLIP LIGATING EXTRA SM BLUE (MISCELLANEOUS) ×2 IMPLANT
CLOTH BEACON ORANGE TIMEOUT ST (SAFETY) ×2 IMPLANT
CLSR STERI-STRIP ANTIMIC 1/2X4 (GAUZE/BANDAGES/DRESSINGS) ×2 IMPLANT
COVER SURGICAL LIGHT HANDLE (MISCELLANEOUS) ×2 IMPLANT
DECANTER SPIKE VIAL GLASS SM (MISCELLANEOUS) ×2 IMPLANT
DRAPE INCISE IOBAN 66X45 STRL (DRAPES) ×2 IMPLANT
ELECT REM PT RETURN 9FT ADLT (ELECTROSURGICAL) ×2
ELECTRODE REM PT RTRN 9FT ADLT (ELECTROSURGICAL) ×1 IMPLANT
GEL ULTRASOUND 20GR AQUASONIC (MISCELLANEOUS) IMPLANT
GLOVE BIOGEL PI IND STRL 6.5 (GLOVE) ×1 IMPLANT
GLOVE BIOGEL PI INDICATOR 6.5 (GLOVE) ×1
GLOVE SS BIOGEL STRL SZ 7.5 (GLOVE) ×1 IMPLANT
GLOVE SUPERSENSE BIOGEL SZ 7.5 (GLOVE) ×1
GOWN STRL NON-REIN LRG LVL3 (GOWN DISPOSABLE) ×6 IMPLANT
GRAFT GORETEX 6X10 (Vascular Products) ×2 IMPLANT
KIT BASIN OR (CUSTOM PROCEDURE TRAY) ×2 IMPLANT
KIT ROOM TURNOVER OR (KITS) ×2 IMPLANT
NEEDLE HYPO 25GX1X1/2 BEV (NEEDLE) ×2 IMPLANT
NS IRRIG 1000ML POUR BTL (IV SOLUTION) ×2 IMPLANT
PACK CV ACCESS (CUSTOM PROCEDURE TRAY) ×2 IMPLANT
PAD ARMBOARD 7.5X6 YLW CONV (MISCELLANEOUS) ×4 IMPLANT
SPONGE GAUZE 4X4 12PLY (GAUZE/BANDAGES/DRESSINGS) ×2 IMPLANT
STRIP CLOSURE SKIN 1/2X4 (GAUZE/BANDAGES/DRESSINGS) ×2 IMPLANT
SUT PROLENE 6 0 CC (SUTURE) ×4 IMPLANT
SUT SILK 2 0 FS (SUTURE) IMPLANT
SUT VIC AB 3-0 SH 27 (SUTURE) ×2
SUT VIC AB 3-0 SH 27X BRD (SUTURE) ×2 IMPLANT
TAPE CLOTH SURG 4X10 WHT LF (GAUZE/BANDAGES/DRESSINGS) ×2 IMPLANT
TOWEL OR 17X24 6PK STRL BLUE (TOWEL DISPOSABLE) ×2 IMPLANT
TOWEL OR 17X26 10 PK STRL BLUE (TOWEL DISPOSABLE) ×2 IMPLANT
UNDERPAD 30X30 INCONTINENT (UNDERPADS AND DIAPERS) ×2 IMPLANT
WATER STERILE IRR 1000ML POUR (IV SOLUTION) ×2 IMPLANT

## 2012-12-14 NOTE — Transfer of Care (Signed)
Immediate Anesthesia Transfer of Care Note  Patient: [name]  Procedure(s) Performed: Procedure(s): Revision of Left Thigh Graft (Left)  Patient Location: PACU  Anesthesia Type:General  Level of Consciousness: awake, alert , oriented and patient cooperative  Airway & Oxygen Therapy: Patient Spontanous Breathing and Patient connected to face mask oxygen  Post-op Assessment: Report given to PACU RN, Post -op Vital signs reviewed and stable and Patient moving all extremities  Post vital signs: Reviewed and stable  Complications: No apparent anesthesia complications

## 2012-12-14 NOTE — Preoperative (Signed)
Beta Blockers   Reason not to administer Beta Blockers:Not Applicable 

## 2012-12-14 NOTE — Anesthesia Postprocedure Evaluation (Signed)
Anesthesia Post Note  Patient: [name]  Procedure(s) Performed: Procedure(s) (LRB): Revision of Left Thigh Graft (Left)  Anesthesia type: general  Patient location: PACU  Post pain: Pain level controlled  Post assessment: Patient's Cardiovascular Status Stable  Last Vitals:  Filed Vitals:   12/14/12 1500  BP: 113/62  Pulse: 87  Temp: 36.7 C  Resp: 18    Post vital signs: Reviewed and stable  Level of consciousness: sedated  Complications: No apparent anesthesia complications

## 2012-12-14 NOTE — Progress Notes (Signed)
Pt to BR

## 2012-12-14 NOTE — Consult Note (Signed)
Vascular Surgery H&P  Chief Complaint: Bleeding from left thigh graft  HPI: [name] is a 57 y.o. male who presents for evaluation of bleeding from left thigh graft. This patient had a small infected area at the 6:00 position of the left thigh graft which was lanced by Dr. Briant Cedar who one week ago. Patient has no history of bleeding from the graft. Yesterday apparently the graft bled at home but this was controlled and the patient did not seek medical attention. Today while having hemodialysis he also began having some bleeding from this area. This has grown to species of staph which are sensitive to vancomycin. Patient has had multiple access in both upper extremities and lower extremities in the past and is now at his last site in the left thigh.   Past Medical History  Diagnosis Date  . Anxiety   . Back pain   . GERD (gastroesophageal reflux disease)   . Peripheral neuropathy   . Arthritis   . CHF (congestive heart failure)   . Active smoker   . Dialysis patient     M-W-F  . Renal failure     MWF dialysis at [facility]  . Diabetes mellitus     diet controlled  . 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 PONV (postoperative nausea and vomiting)   . Hypertension   . Bell palsy   . Heart murmur   . Shortness of breath     with ambulation  . Carpal tunnel syndrome     bilateral  . Hemodialysis patient     M,W,F  . Dysrhythmia     Hx: of palpitataions  . Headache(784.0)     Hx: of Migraines   Past Surgical History  Procedure Laterality Date  . Total hip arthroplasty    . Thyroidectomy    . Tooth extraction    . Mandible fracture surgery    . Dg av dialysis graft declot or    . Insertion of dialysis catheter  03/28/2011    Procedure: INSERTION OF DIALYSIS CATHETER;  Surgeon: Pryor Ochoa, MD;  Location: [facility] OR;  Service: Vascular;  Laterality: Left;  . Av fistula placement  03/31/2011    Procedure: INSERTION OF ARTERIOVENOUS (AV) GORE-TEX GRAFT THIGH;   Surgeon: Sherren Kerns, MD;  Location: [facility] OR;  Service: Vascular;  Laterality: Right;  redo right thigh arteriovenous gortex graft using gore-tex stretch 6mm x 70cm  . Thrombectomy w/ embolectomy  06/02/2011    Procedure: THROMBECTOMY ARTERIOVENOUS GORE-TEX GRAFT;  Surgeon: Chuck Hint, MD;  Location: [facility] OR;  Service: Vascular;  Laterality: Right;  Thrombectomy right thigh arteriovenous gortex graft;  revision by  replacement of large portion of graft with 7mm gore-tex   . Insertion of dialysis catheter  08/28/2011    Procedure: INSERTION OF DIALYSIS CATHETER;  Surgeon: Fransisco Hertz, MD;  Location: [facility] OR;  Service: Vascular;  Laterality: Left;  Atempted Bilateral Internal Jugular, Bilateral Subclavin insertion of 55cm Dialysis Catheter Left Femoral  . Insertion of dialysis catheter  12/15/2011    Procedure: INSERTION OF DIALYSIS CATHETER;  Surgeon: Sherren Kerns, MD;  Location: [facility] OR;  Service: Vascular;  Laterality: Right;  Insertion of Right Femoral Dialysis Catheter  . Exchange of a dialysis catheter  02/26/2012    Procedure: EXCHANGE OF A DIALYSIS CATHETER;  Surgeon: Larina Earthly, MD;  Location: [facility] OR;  Service: Vascular;  Laterality: Right;  . Joint replacement    .  Exchange of a dialysis catheter  05/18/2012    Procedure: EXCHANGE OF A DIALYSIS CATHETER;  Surgeon: Larina Earthly, MD;  Location: [facility] OR;  Service: Vascular;  Laterality: Right;  right femoral dialysis catheter  . Av fistula placement  05/18/2012    Procedure: INSERTION OF ARTERIOVENOUS (AV) GORE-TEX GRAFT THIGH;  Surgeon: Larina Earthly, MD;  Location: [facility] OR;  Service: Vascular;  Laterality: Left;  using 6mm by 70cm goretex graft  . Thrombectomy w/ embolectomy Left 08/25/2012    Procedure: THROMBECTOMY ARTERIOVENOUS GORE-TEX GRAFT;  Surgeon: Pryor Ochoa, MD;  Location: [facility] OR;  Service: Vascular;  Laterality: Left;  Attempted thrombectomy of left thigh arteriovenous gortex graft.   . Av fistula placement Left 08/25/2012     Procedure: INSERTION OF ARTERIOVENOUS (AV) GORE-TEX GRAFT THIGH;  Surgeon: Pryor Ochoa, MD;  Location: [facility] OR;  Service: Vascular;  Laterality: Left;  Using 6mm x 40cm vascular Gortex graft.   History   Social History  . Marital Status: Single    Spouse Name: N/A    Number of Children: N/A  . Years of Education: N/A   Social History Main Topics  . Smoking status: Current Every Day Smoker -- 1.00 packs/day for 40 years    Types: Cigarettes  . Smokeless tobacco: Never Used     Comment: pt states that he wants to quit  . Alcohol Use: No  . Drug Use: No  . Sexual Activity: No   Other Topics Concern  . None   Social History Narrative  . None   Family History  Problem Relation Age of Onset  . Diabetes Mother   . Stroke Mother   . Heart disease Mother   . Kidney disease Father   . Hyperlipidemia Father    No Known Allergies Prior to Admission medications   Medication Sig Start Date End Date Taking? Authorizing Provider  amLODipine (NORVASC) 10 MG tablet Take 10 mg by mouth daily.    Historical Provider, MD  aspirin 325 MG EC tablet Take 325 mg by mouth daily.      Historical Provider, MD  b complex-vitamin c-folic acid (NEPHRO-VITE) 0.8 MG TABS Take 0.8 mg by mouth at bedtime.     Historical Provider, MD  calcium acetate (PHOSLO) 667 MG capsule Take 2,668 mg by mouth 3 (three) times daily with meals.      Historical Provider, MD  colchicine 0.6 MG tablet Take 0.6 mg by mouth 2 (two) times daily as needed. For gout    Historical Provider, MD  cyclobenzaprine (FLEXERIL) 5 MG tablet Take 1 tablet (5 mg total) by mouth 2 (two) times daily as needed for muscle spasms. 04/21/12   Teressa Lower, NP  diphenhydrAMINE (BENADRYL) 25 MG tablet Take 25 mg by mouth every 6 (six) hours as needed. For itching    Historical Provider, MD  esomeprazole (NEXIUM) 40 MG capsule Take 40 mg by mouth every evening.     Historical Provider, MD  gabapentin (NEURONTIN) 100 MG capsule Take 100 mg by  mouth at bedtime as needed. For nerve pain    Historical Provider, MD  oxyCODONE (OXY IR/ROXICODONE) 5 MG immediate release tablet Take 1 tablet (5 mg total) by mouth every 6 (six) hours as needed. For pain 08/25/12   Lars Mage, PA-C  Sevelamer Carbonate (RENVELA) 2.4 G PACK Take 2.4 g by mouth 3 (three) times daily as needed. To control potassium level    Historical Provider, MD  sodium bicarbonate 650 MG tablet  Take 650 mg by mouth 2 (two) times daily.      Historical Provider, MD  sulindac (CLINORIL) 200 MG tablet Take 200 mg by mouth 2 (two) times daily.    Historical Provider, MD     Positive ROS: Denies chest pain, dyspnea on exertion. Does have bilateral edema.  All other systems have been reviewed and were otherwise negative with the exception of those mentioned in the HPI and as above.  Physical Exam: Filed Vitals:   12/14/12 1050  BP: 120/60  Pulse: 80  Temp: 98.1 F (36.7 C)  Resp: 20    General: Alert, no acute distress HEENT: Normal for age Cardiovascular: Regular rate and rhythm. Carotid pulses 2+, no bruits audible Respiratory: Clear to auscultation. No cyanosis, no use of accessory musculature GI: No organomegaly, abdomen is soft and non-tender Skin: No lesions in the area of chief complaint Neurologic: Sensation intact distally Psychiatric: Patient is competent for consent with normal mood and affect Musculoskeletal: No obvious deformities Extremities: Left thigh with patent AV graft. 2 mm opening at the 6:00 position of the graft with some brownish drainage. No erythema or tenderness around remainder of the graft. Multiple thrombosed grafts in right thigh     Assessment/Plan:  Will take patient emergently to OR for revision of thigh graft and removal of this segment at the 6:00 position. Hopefully we can salvage this thigh graft without it becoming infected since it is his last access site. Discussed with patient and he is in agreement   Josephina Gip,  MD 12/14/2012 11:34 AM

## 2012-12-14 NOTE — Progress Notes (Signed)
Pt back from [facility]

## 2012-12-14 NOTE — Anesthesia Preprocedure Evaluation (Signed)
Anesthesia Evaluation  Patient identified by MRN, date of birth, ID band Patient awake    Reviewed: Allergy & Precautions, H&P , NPO status   History of Anesthesia Complications (+) PONV  Airway Mallampati: II TM Distance: >3 FB Neck ROM: Full    Dental  (+) Edentulous Upper and Edentulous Lower   Pulmonary shortness of breath and with exertion, COPDCurrent Smoker,  + rhonchi         Cardiovascular hypertension, +CHF + Valvular Problems/Murmurs Rhythm:Regular Rate:Normal     Neuro/Psych  Headaches, Anxiety  Neuromuscular disease    GI/Hepatic GERD-  Controlled and Medicated,(+) Hepatitis -  Endo/Other  diabetes, Type 2  Renal/GU Dialysis and ESRFRenal disease     Musculoskeletal   Abdominal (+) + obese,   Peds  Hematology   Anesthesia Other Findings   Reproductive/Obstetrics                           Anesthesia Physical Anesthesia Plan  ASA: III  Anesthesia Plan: General   Post-op Pain Management:    Induction: Intravenous  Airway Management Planned: LMA  Additional Equipment:   Intra-op Plan:   Post-operative Plan: Extubation in OR  Informed Consent: I have reviewed the patients History and Physical, chart, labs and discussed the procedure including the risks, benefits and alternatives for the proposed anesthesia with the patient or authorized representative who has indicated his/her understanding and acceptance.     Plan Discussed with: CRNA and Surgeon  Anesthesia Plan Comments:         Anesthesia Quick Evaluation

## 2012-12-14 NOTE — ED Notes (Signed)
Reports having graft to left upper leg, last dialysis was this am but was unable to complete entire treatment due to wound to access which is draining and bleeding. No bleeding noted at this time, bandage in place.

## 2012-12-14 NOTE — Anesthesia Procedure Notes (Signed)
Procedure Name: Intubation Date/Time: 12/14/2012 12:21 PM Performed by: Jerilee Hoh Pre-anesthesia Checklist: Patient identified, Emergency Drugs available, Suction available and Patient being monitored Patient Re-evaluated:Patient Re-evaluated prior to inductionOxygen Delivery Method: Circle system utilized Preoxygenation: Pre-oxygenation with 100% oxygen Intubation Type: Rapid sequence, IV induction and Cricoid Pressure applied Laryngoscope Size: Mac and 4 Grade View: Grade II Tube type: Oral Tube size: 7.5 mm Number of attempts: 1 Airway Equipment and Method: Stylet Placement Confirmation: ETT inserted through vocal cords under direct vision,  positive ETCO2 and breath sounds checked- equal and bilateral Secured at: 22 cm Tube secured with: Tape Dental Injury: Teeth and Oropharynx as per pre-operative assessment

## 2012-12-14 NOTE — Progress Notes (Signed)
Charles lyles PA aware of pt's procedure today.  Order received to give Kayexalate or pt to take tomorrow.  Pt to return to HD center Friday at usual time

## 2012-12-14 NOTE — Op Note (Signed)
OPERATIVE REPORT  DATE OF SURGERY: 12/14/2012  PATIENT: [name], 57 y.o. male MRN: [id number]  DOB: Dec 03, 1955  PRE-OPERATIVE DIAGNOSIS: End-stage renal disease with bleeding from left femoral loop AV Gore-Tex graft  POST-OPERATIVE DIAGNOSIS:  Same  PROCEDURE: Removal of segment of left femoral loop AV Gore-Tex graft and rerouting new graft around this area.  SURGEON:  Gretta Began, M.D.  PHYSICIAN ASSISTANT: Nurse  ANESTHESIA:  Gen.  EBL: Minimal ml     BLOOD ADMINISTERED: None  DRAINS: None  SPECIMEN: None  COUNTS CORRECT:  YES  PLAN OF CARE: PACU   PATIENT DISPOSITION:  PACU - hemodynamically stable  PROCEDURE DETAILS: The patient was taken to the operating placed supine position where the area of the left groin left leg were prepped in sterile fashion. An incision was made over the arterial limb of the left femoral loop graft near the apex. There was a bleeding from the apex of the graft through a small ulcerated hole. The graft was intact in this area and was isolated for control. Next a separate incision was made over the venous limb of the graft near the apex of the graft. There was thrombus in this area and I see there was some bleeding from the graft in this area. There didn't multiple punctures at this level and there was some laceration of the graft. It appeared that this is with a bleeding source. The graft was dissected further toward the venous limb of the graft and was occluded above the area of laceration. The venous and arterial limbs were flushed with heparinized saline and reoccluded. The venous and arterial ends were transected below the proximal clamp and below the distal clamp. A new, was created around the area of the current bleeding site. A 6 mm Gore-Tex was brought through this tunnel. The old Gore-Tex that was at the bleeding site was removed in its entirety. The new interposition graft was cut to appropriate length and was sewn end to end to the graft  at the proximal and distal incisions. Clamps removed and good thrill was noted. The wounds were irrigated with saline. Hemostasis obtained electrocautery. Wounds were closed with 3-0 Vicryl in the subcutaneous and subcuticular tissue. Sterile dressing was applied and the patient was taken to the recovery in stable condition   Gretta Began, M.D. 12/14/2012 1:29 PM

## 2012-12-15 ENCOUNTER — Encounter (HOSPITAL_COMMUNITY): Payer: Self-pay | Admitting: Vascular Surgery

## 2013-01-21 ENCOUNTER — Other Ambulatory Visit (HOSPITAL_COMMUNITY): Payer: Self-pay | Admitting: Nephrology

## 2013-01-21 ENCOUNTER — Ambulatory Visit (HOSPITAL_COMMUNITY)
Admission: RE | Admit: 2013-01-21 | Discharge: 2013-01-21 | Disposition: A | Discharge status: Home / Self Care | Payer: Medicare Other | Source: Ambulatory Visit | Attending: Nephrology | Admitting: Nephrology

## 2013-01-21 DIAGNOSIS — T8241XA Breakdown (mechanical) of vascular dialysis catheter, initial encounter: Secondary | ICD-10-CM

## 2013-01-21 DIAGNOSIS — N186 End stage renal disease: Secondary | ICD-10-CM

## 2013-02-06 ENCOUNTER — Telehealth: Payer: Self-pay

## 2013-02-06 NOTE — Telephone Encounter (Addendum)
[place], [place] from [facility] called to request appt. For pt. on a Tues. or Thurs., due to c/o pain (L) arm at site of new raised area on left forearm.  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  Stated pt. reports the raised area became noticeable within the past 2 days.   Advised will contact pt. To schedule appt. For evaluation.

## 2013-02-13 ENCOUNTER — Encounter: Payer: Self-pay | Admitting: Vascular Surgery

## 2013-02-14 ENCOUNTER — Other Ambulatory Visit: Payer: Self-pay

## 2013-02-14 ENCOUNTER — Encounter: Payer: Self-pay | Admitting: Vascular Surgery

## 2013-02-14 ENCOUNTER — Encounter (INDEPENDENT_AMBULATORY_CARE_PROVIDER_SITE_OTHER): Payer: Self-pay

## 2013-02-14 ENCOUNTER — Ambulatory Visit (INDEPENDENT_AMBULATORY_CARE_PROVIDER_SITE_OTHER): Payer: Medicare Other | Admitting: Vascular Surgery

## 2013-02-14 VITALS — BP 121/81 | HR 73 | Ht 71.0 in | Wt 280.3 lb

## 2013-02-14 DIAGNOSIS — T82898A Other specified complication of vascular prosthetic devices, implants and grafts, initial encounter: Secondary | ICD-10-CM

## 2013-02-14 DIAGNOSIS — N186 End stage renal disease: Secondary | ICD-10-CM

## 2013-02-14 NOTE — Progress Notes (Signed)
The patient presents today for evaluation of left arm infection. He is well known to me from a recent replacement of half of his femoral graft on the left femoral artery on 12/14/2012. This is all well healed and he is has excellent function with no problems with hemodialysis. He reports that over the base he had a swelling in the left antecubital space and had spontaneous drainage of pus from this. He denies any fever or chills.  Past Medical History  Diagnosis Date  . Anxiety   . Back pain   . GERD (gastroesophageal reflux disease)   . Peripheral neuropathy   . Arthritis   . CHF (congestive heart failure)   . Active smoker   . Dialysis patient     M-W-F  . Renal failure     MWF dialysis at [place] [facility]  . Diabetes mellitus     diet controlled  . 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 PONV (postoperative nausea and vomiting)   . Hypertension   . Bell palsy   . Heart murmur   . Shortness of breath     with ambulation  . Carpal tunnel syndrome     bilateral  . Hemodialysis patient     M,W,F  . Dysrhythmia     Hx: of palpitataions  . Headache(784.0)     Hx: of Migraines    History  Substance Use Topics  . Smoking status: Current Every Day Smoker -- 1.00 packs/day for 40 years    Types: Cigarettes  . Smokeless tobacco: Never Used     Comment: pt states that he wants to quit  . Alcohol Use: No    Family History  Problem Relation Age of Onset  . Diabetes Mother   . Stroke Mother   . Heart disease Mother   . Kidney disease Father   . Hyperlipidemia Father     No Known Allergies  Current outpatient prescriptions:amLODipine (NORVASC) 10 MG tablet, Take 10 mg by mouth daily., Disp: , Rfl: ;  aspirin 325 MG EC tablet, Take 325 mg by mouth daily.  , Disp: , Rfl: ;  b complex-vitamin c-folic acid (NEPHRO-VITE) 0.8 MG TABS, Take 0.8 mg by mouth at bedtime. , Disp: , Rfl: ;  calcium acetate (PHOSLO) 667 MG capsule, Take 2,668 mg by mouth 3 (three) times daily with  meals.  , Disp: , Rfl:  colchicine 0.6 MG tablet, Take 0.6 mg by mouth 2 (two) times daily as needed. For gout, Disp: , Rfl: ;  cyclobenzaprine (FLEXERIL) 5 MG tablet, Take 1 tablet (5 mg total) by mouth 2 (two) times daily as needed for muscle spasms., Disp: 20 tablet, Rfl: 0;  diphenhydrAMINE (BENADRYL) 25 MG tablet, Take 25 mg by mouth every 6 (six) hours as needed. For itching, Disp: , Rfl:  esomeprazole (NEXIUM) 40 MG capsule, Take 40 mg by mouth every evening. , Disp: , Rfl: ;  gabapentin (NEURONTIN) 100 MG capsule, Take 100 mg by mouth at bedtime as needed. For nerve pain, Disp: , Rfl: ;  oxyCODONE (OXY IR/ROXICODONE) 5 MG immediate release tablet, Take 1 tablet (5 mg total) by mouth every 6 (six) hours as needed. For pain, Disp: 30 tablet, Rfl: 0 oxyCODONE-acetaminophen (ROXICET) 5-325 MG per tablet, Take 1-2 tablets by mouth every 4 (four) hours as needed for pain., Disp: 30 tablet, Rfl: 0;  Sevelamer Carbonate (RENVELA) 2.4 G PACK, Take 2.4 g by mouth 3 (three) times daily as   needed. To control potassium level, Disp: , Rfl: ;  sodium bicarbonate 650 MG tablet, Take 650 mg by mouth 2 (two) times daily.  , Disp: , Rfl:  sulindac (CLINORIL) 200 MG tablet, Take 200 mg by mouth 2 (two) times daily., Disp: , Rfl:   BP 121/81  Pulse 73  Ht 5' 11" (1.803 m)  Wt 280 lb 4.8 oz (127.143 kg)  BMI 39.11 kg/m2  SpO2 96%  Body mass index is 39.11 kg/(m^2).       Physical exam a well-nourished gentleman in no acute distress. Left femoral loop graft as excellent thrill with no evidence of erythema or infection. Left arm shows a multiple old grafts to include several form loops and an upper arm straight graft as well. This does appear to be away from the area of his brachial artery. This is more on the ulnar aspect of his antecubital space. He has an open area with pus draining from this.  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 I explained the need to excise at  least this portion of his graft. He has multiple forearm and an upper arm nonfunctional graft as well. These are completely incorporated and not in acute of the involved. I explained the option of attempting to remove all prosthetic graft from his left arm versus removing the area this involved. I did explain the magnitude of the removing all Gore-Tex versus removing the area that is involved and watching this closely for recurrent infection. He wishes to proceed with surgery for removal of infected graft. He understands the decision for the extent of removal will be determined at surgery. He dialyzes on Monday Wednesday and Friday and therefore will have surgery on Thursday at [place] [facility] as an outpatient 

## 2013-02-15 ENCOUNTER — Encounter (HOSPITAL_COMMUNITY): Payer: Self-pay | Admitting: *Deleted

## 2013-02-15 MED ORDER — MIDAZOLAM HCL 2 MG/2ML IJ SOLN: 1.0000 mg | INTRAMUSCULAR | Status: DC | PRN

## 2013-02-15 MED ORDER — DEXTROSE 5 % IV SOLN
1.5000 g | INTRAVENOUS | Status: AC
  Administered 2013-02-16: 1.5 g via INTRAVENOUS
  Filled 2013-02-15: qty 1.5

## 2013-02-15 MED ORDER — FENTANYL CITRATE 0.05 MG/ML IJ SOLN
50.0000 ug | Freq: Once | INTRAMUSCULAR | Status: AC
  Administered 2013-02-16: 100 ug via INTRAVENOUS

## 2013-02-15 NOTE — Progress Notes (Signed)
02/15/13 1842  OBSTRUCTIVE SLEEP APNEA  Have you ever been diagnosed with sleep apnea through a sleep study? No  Do you snore loudly (loud enough to be heard through closed doors)?  0  Do you often feel tired, fatigued, or sleepy during the daytime? 0  Has anyone observed you stop breathing during your sleep? 0  Do you have, or are you being treated for high blood pressure? 1  BMI more than 35 kg/m2? 1  Age over 57 years old? 1  Neck circumference greater than 40 cm/18 inches? 1  Gender: 1  Obstructive Sleep Apnea Score 5  Score 4 or greater  Results sent to PCP

## 2013-02-16 ENCOUNTER — Telehealth: Payer: Self-pay | Admitting: Vascular Surgery

## 2013-02-16 ENCOUNTER — Ambulatory Visit (HOSPITAL_COMMUNITY): Payer: Medicare Other | Admitting: Certified Registered"

## 2013-02-16 ENCOUNTER — Encounter (HOSPITAL_COMMUNITY): Payer: Self-pay | Admitting: Pharmacy Technician

## 2013-02-16 ENCOUNTER — Emergency Department (HOSPITAL_COMMUNITY)
Admission: EM | Admit: 2013-02-16 | Discharge: 2013-02-16 | Discharge status: Against Medical Advice | Payer: Medicare Other | Attending: Emergency Medicine | Admitting: Emergency Medicine

## 2013-02-16 ENCOUNTER — Encounter (HOSPITAL_COMMUNITY): Payer: Self-pay | Admitting: *Deleted

## 2013-02-16 ENCOUNTER — Encounter (HOSPITAL_COMMUNITY)
Admission: RE | Disposition: A | Discharge status: Home / Self Care | Payer: Self-pay | Source: Ambulatory Visit | Attending: Vascular Surgery

## 2013-02-16 ENCOUNTER — Encounter (HOSPITAL_COMMUNITY): Payer: Medicare Other | Admitting: Certified Registered"

## 2013-02-16 ENCOUNTER — Ambulatory Visit (HOSPITAL_COMMUNITY)
Admission: RE | Admit: 2013-02-16 | Discharge: 2013-02-16 | Disposition: A | Discharge status: Home / Self Care | Payer: Medicare Other | Source: Ambulatory Visit | Attending: Vascular Surgery | Admitting: Vascular Surgery

## 2013-02-16 DIAGNOSIS — F17200 Nicotine dependence, unspecified, uncomplicated: Secondary | ICD-10-CM | POA: Insufficient documentation

## 2013-02-16 DIAGNOSIS — Y832 Surgical operation with anastomosis, bypass or graft as the cause of abnormal reaction of the patient, or of later complication, without mention of misadventure at the time of the procedure: Secondary | ICD-10-CM | POA: Insufficient documentation

## 2013-02-16 DIAGNOSIS — M129 Arthropathy, unspecified: Secondary | ICD-10-CM | POA: Insufficient documentation

## 2013-02-16 DIAGNOSIS — I120 Hypertensive chronic kidney disease with stage 5 chronic kidney disease or end stage renal disease: Secondary | ICD-10-CM | POA: Insufficient documentation

## 2013-02-16 DIAGNOSIS — T827XXA Infection and inflammatory reaction due to other cardiac and vascular devices, implants and grafts, initial encounter: Secondary | ICD-10-CM | POA: Insufficient documentation

## 2013-02-16 DIAGNOSIS — G609 Hereditary and idiopathic neuropathy, unspecified: Secondary | ICD-10-CM | POA: Insufficient documentation

## 2013-02-16 DIAGNOSIS — I509 Heart failure, unspecified: Secondary | ICD-10-CM | POA: Insufficient documentation

## 2013-02-16 DIAGNOSIS — Z79899 Other long term (current) drug therapy: Secondary | ICD-10-CM | POA: Insufficient documentation

## 2013-02-16 DIAGNOSIS — Z01818 Encounter for other preprocedural examination: Secondary | ICD-10-CM | POA: Insufficient documentation

## 2013-02-16 DIAGNOSIS — Z992 Dependence on renal dialysis: Secondary | ICD-10-CM | POA: Insufficient documentation

## 2013-02-16 DIAGNOSIS — E119 Type 2 diabetes mellitus without complications: Secondary | ICD-10-CM | POA: Insufficient documentation

## 2013-02-16 DIAGNOSIS — F411 Generalized anxiety disorder: Secondary | ICD-10-CM | POA: Insufficient documentation

## 2013-02-16 DIAGNOSIS — Z01810 Encounter for preprocedural cardiovascular examination: Secondary | ICD-10-CM | POA: Insufficient documentation

## 2013-02-16 DIAGNOSIS — T82898A Other specified complication of vascular prosthetic devices, implants and grafts, initial encounter: Secondary | ICD-10-CM

## 2013-02-16 DIAGNOSIS — N186 End stage renal disease: Secondary | ICD-10-CM | POA: Insufficient documentation

## 2013-02-16 DIAGNOSIS — Z5320 Procedure and treatment not carried out because of patient's decision for unspecified reasons: Secondary | ICD-10-CM | POA: Insufficient documentation

## 2013-02-16 DIAGNOSIS — K219 Gastro-esophageal reflux disease without esophagitis: Secondary | ICD-10-CM | POA: Insufficient documentation

## 2013-02-16 HISTORY — PX: AVGG REMOVAL: SHX5153

## 2013-02-16 LAB — POCT I-STAT 4, (NA,K, GLUC, HGB,HCT)
Glucose, Bld: 86 mg/dL (ref 70–99)
HCT: 39 % (ref 39.0–52.0)
Hemoglobin: 13.3 g/dL (ref 13.0–17.0)
Potassium: 5.6 mEq/L — ABNORMAL HIGH (ref 3.5–5.1)
Sodium: 135 mEq/L (ref 135–145)

## 2013-02-16 LAB — GLUCOSE, CAPILLARY

## 2013-02-16 SURGERY — REMOVAL OF ARTERIOVENOUS GORETEX GRAFT (AVGG)
Anesthesia: General | Site: Arm Upper | Laterality: Left | Wound class: Dirty or Infected

## 2013-02-16 MED ORDER — PHENYLEPHRINE HCL 10 MG/ML IJ SOLN
INTRAMUSCULAR | Status: DC | PRN
  Administered 2013-02-16 (×2): 120 ug via INTRAVENOUS

## 2013-02-16 MED ORDER — LIDOCAINE-EPINEPHRINE (PF) 1 %-1:200000 IJ SOLN
INTRAMUSCULAR | Status: DC | PRN
  Administered 2013-02-16: 30 mL
  Administered 2013-02-16: 14 mL via INTRADERMAL

## 2013-02-16 MED ORDER — LIDOCAINE HCL (PF) 1 % IJ SOLN
INTRAMUSCULAR | Status: AC
  Filled 2013-02-16: qty 30

## 2013-02-16 MED ORDER — CIPROFLOXACIN HCL 500 MG PO TABS: 500.0000 mg | ORAL_TABLET | Freq: Two times a day (BID) | ORAL | Status: DC

## 2013-02-16 MED ORDER — PROPOFOL 10 MG/ML IV BOLUS
INTRAVENOUS | Status: DC | PRN
  Administered 2013-02-16: 100 mg via INTRAVENOUS

## 2013-02-16 MED ORDER — ONDANSETRON HCL 4 MG/2ML IJ SOLN
INTRAMUSCULAR | Status: DC | PRN
  Administered 2013-02-16: 4 mg via INTRAMUSCULAR

## 2013-02-16 MED ORDER — THROMBIN 20000 UNITS EX SOLR
CUTANEOUS | Status: AC
  Filled 2013-02-16: qty 20000

## 2013-02-16 MED ORDER — MIDAZOLAM HCL 5 MG/5ML IJ SOLN
INTRAMUSCULAR | Status: DC | PRN
  Administered 2013-02-16: 2 mg via INTRAVENOUS

## 2013-02-16 MED ORDER — SODIUM CHLORIDE 0.9 % IR SOLN
Status: DC | PRN
  Administered 2013-02-16: 08:00:00

## 2013-02-16 MED ORDER — PHENYLEPHRINE HCL 10 MG/ML IJ SOLN
10.0000 mg | INTRAVENOUS | Status: DC | PRN
  Administered 2013-02-16: 20 ug/min via INTRAVENOUS

## 2013-02-16 MED ORDER — FENTANYL CITRATE 0.05 MG/ML IJ SOLN
INTRAMUSCULAR | Status: AC
  Filled 2013-02-16: qty 2

## 2013-02-16 MED ORDER — ESMOLOL HCL 10 MG/ML IV SOLN
INTRAVENOUS | Status: DC | PRN
  Administered 2013-02-16: 10 mg via INTRAVENOUS
  Administered 2013-02-16: 5 mg via INTRAVENOUS

## 2013-02-16 MED ORDER — SODIUM CHLORIDE 0.9 % IV SOLN: INTRAVENOUS | Status: DC

## 2013-02-16 MED ORDER — LIDOCAINE HCL (PF) 1 % IJ SOLN
INTRAMUSCULAR | Status: DC | PRN
  Administered 2013-02-16: 8 mL

## 2013-02-16 MED ORDER — OXYCODONE HCL 5 MG PO TABS
10.0000 mg | ORAL_TABLET | Freq: Once | ORAL | Status: AC
  Administered 2013-02-16: 10 mg via ORAL

## 2013-02-16 MED ORDER — PROPOFOL INFUSION 10 MG/ML OPTIME
INTRAVENOUS | Status: DC | PRN
  Administered 2013-02-16: 120 ug/kg/min via INTRAVENOUS

## 2013-02-16 MED ORDER — 0.9 % SODIUM CHLORIDE (POUR BTL) OPTIME
TOPICAL | Status: DC | PRN
  Administered 2013-02-16: 1000 mL

## 2013-02-16 MED ORDER — OXYCODONE HCL 5 MG PO TABS: 5.0000 mg | ORAL_TABLET | Freq: Four times a day (QID) | ORAL | Status: DC | PRN

## 2013-02-16 MED ORDER — ALBUMIN HUMAN 5 % IV SOLN
INTRAVENOUS | Status: DC | PRN
  Administered 2013-02-16: 09:00:00 via INTRAVENOUS

## 2013-02-16 MED ORDER — FENTANYL CITRATE 0.05 MG/ML IJ SOLN
INTRAMUSCULAR | Status: DC | PRN
  Administered 2013-02-16: 50 ug via INTRAVENOUS
  Administered 2013-02-16 (×2): 25 ug via INTRAVENOUS
  Administered 2013-02-16: 50 ug via INTRAVENOUS
  Administered 2013-02-16: 100 ug via INTRAVENOUS
  Administered 2013-02-16: 25 ug via INTRAVENOUS
  Administered 2013-02-16: 50 ug via INTRAVENOUS
  Administered 2013-02-16: 25 ug via INTRAVENOUS

## 2013-02-16 MED ORDER — PROTAMINE SULFATE 10 MG/ML IV SOLN
INTRAVENOUS | Status: DC | PRN
  Administered 2013-02-16: 20 mg via INTRAVENOUS
  Administered 2013-02-16 (×2): 10 mg via INTRAVENOUS

## 2013-02-16 MED ORDER — HEPARIN SODIUM (PORCINE) 1000 UNIT/ML IJ SOLN
INTRAMUSCULAR | Status: DC | PRN
  Administered 2013-02-16: 6000 [IU] via INTRAVENOUS

## 2013-02-16 MED ORDER — SODIUM CHLORIDE 0.9 % IV SOLN
INTRAVENOUS | Status: DC | PRN
  Administered 2013-02-16 (×2): via INTRAVENOUS

## 2013-02-16 MED ORDER — LIDOCAINE-EPINEPHRINE (PF) 1 %-1:200000 IJ SOLN
INTRAMUSCULAR | Status: AC
  Filled 2013-02-16: qty 10

## 2013-02-16 MED ORDER — DOXYCYCLINE HYCLATE 100 MG PO TABS: 100.0000 mg | ORAL_TABLET | Freq: Two times a day (BID) | ORAL | Status: DC

## 2013-02-16 MED ORDER — OXYCODONE HCL 5 MG PO TABS
ORAL_TABLET | ORAL | Status: AC
  Filled 2013-02-16: qty 2

## 2013-02-16 SURGICAL SUPPLY — 44 items
BANDAGE ELASTIC 4 VELCRO ST LF (GAUZE/BANDAGES/DRESSINGS) ×4 IMPLANT
BANDAGE GAUZE ELAST BULKY 4 IN (GAUZE/BANDAGES/DRESSINGS) ×4 IMPLANT
BNDG ESMARK 4X9 LF (GAUZE/BANDAGES/DRESSINGS) ×2 IMPLANT
CANISTER SUCTION 2500CC (MISCELLANEOUS) ×2 IMPLANT
CATH EMB 3FR 40CM (CATHETERS) ×2 IMPLANT
CLIP TI MEDIUM 6 (CLIP) ×2 IMPLANT
CLIP TI WIDE RED SMALL 6 (CLIP) ×2 IMPLANT
COVER SURGICAL LIGHT HANDLE (MISCELLANEOUS) ×2 IMPLANT
CUFF TOURNIQUET SINGLE 24IN (TOURNIQUET CUFF) ×2 IMPLANT
DECANTER SPIKE VIAL GLASS SM (MISCELLANEOUS) IMPLANT
DERMABOND ADVANCED (GAUZE/BANDAGES/DRESSINGS) ×1
DERMABOND ADVANCED .7 DNX12 (GAUZE/BANDAGES/DRESSINGS) ×1 IMPLANT
ELECT REM PT RETURN 9FT ADLT (ELECTROSURGICAL) ×2
ELECTRODE REM PT RTRN 9FT ADLT (ELECTROSURGICAL) ×1 IMPLANT
GEL ULTRASOUND 20GR AQUASONIC (MISCELLANEOUS) ×2 IMPLANT
GLOVE BIO SURGEON STRL SZ 6.5 (GLOVE) ×6 IMPLANT
GLOVE BIO SURGEON STRL SZ7.5 (GLOVE) ×2 IMPLANT
GLOVE BIOGEL PI IND STRL 6.5 (GLOVE) ×4 IMPLANT
GLOVE BIOGEL PI IND STRL 8 (GLOVE) ×1 IMPLANT
GLOVE BIOGEL PI INDICATOR 6.5 (GLOVE) ×4
GLOVE BIOGEL PI INDICATOR 8 (GLOVE) ×1
GLOVE ECLIPSE 6.5 STRL STRAW (GLOVE) ×2 IMPLANT
GLOVE SURG SS PI 6.5 STRL IVOR (GLOVE) ×2 IMPLANT
GOWN STRL NON-REIN LRG LVL3 (GOWN DISPOSABLE) ×10 IMPLANT
KIT BASIN OR (CUSTOM PROCEDURE TRAY) ×2 IMPLANT
KIT ROOM TURNOVER OR (KITS) ×2 IMPLANT
NEEDLE HYPO 25GX1X1/2 BEV (NEEDLE) ×2 IMPLANT
NS IRRIG 1000ML POUR BTL (IV SOLUTION) ×2 IMPLANT
PACK CV ACCESS (CUSTOM PROCEDURE TRAY) ×2 IMPLANT
PAD ARMBOARD 7.5X6 YLW CONV (MISCELLANEOUS) ×4 IMPLANT
SPONGE GAUZE 4X4 12PLY (GAUZE/BANDAGES/DRESSINGS) ×2 IMPLANT
SPONGE SURGIFOAM ABS GEL 100 (HEMOSTASIS) IMPLANT
SUT ETHILON 3 0 PS 1 (SUTURE) ×2 IMPLANT
SUT ETHILON 4 0 PS 2 18 (SUTURE) ×4 IMPLANT
SUT PROLENE 6 0 BV (SUTURE) ×2 IMPLANT
SUT VIC AB 3-0 SH 27 (SUTURE) ×2
SUT VIC AB 3-0 SH 27X BRD (SUTURE) ×2 IMPLANT
SUT VICRYL 4-0 PS2 18IN ABS (SUTURE) ×4 IMPLANT
SWAB COLLECTION DEVICE MRSA (MISCELLANEOUS) ×2 IMPLANT
TOWEL OR 17X24 6PK STRL BLUE (TOWEL DISPOSABLE) ×2 IMPLANT
TOWEL OR 17X26 10 PK STRL BLUE (TOWEL DISPOSABLE) ×2 IMPLANT
TUBE ANAEROBIC SPECIMEN COL (MISCELLANEOUS) ×2 IMPLANT
UNDERPAD 30X30 INCONTINENT (UNDERPADS AND DIAPERS) ×2 IMPLANT
WATER STERILE IRR 1000ML POUR (IV SOLUTION) ×2 IMPLANT

## 2013-02-16 NOTE — Interval H&P Note (Signed)
History and Physical Interval Note:  02/16/2013 7:28 AM  [name]  has presented today for surgery, with the diagnosis of Infected nonfunctioning left arm arteriovenous gortex graft end stage renal disease  The various methods of treatment have been discussed with the patient and family. After consideration of risks, benefits and other options for treatment, the patient has consented to  Procedure(s): EXCISION OF LEFT ARM ARTERIOVENOUS GORETEX GRAFT (AVGG) (Left) as a surgical intervention .  The patient's history has been reviewed, patient examined, no change in status, stable for surgery.  I have reviewed the patient's chart and labs.  Questions were answered to the patient's satisfaction.     [name]

## 2013-02-16 NOTE — Anesthesia Preprocedure Evaluation (Signed)
Anesthesia Evaluation  Patient identified by MRN, date of birth, ID band Patient awake    Airway Mallampati: I      Dental   Pulmonary shortness of breath,          Cardiovascular hypertension, +CHF     Neuro/Psych    GI/Hepatic GERD-  ,(+) Hepatitis -  Endo/Other  diabetes  Renal/GU Renal disease     Musculoskeletal   Abdominal   Peds  Hematology   Anesthesia Other Findings   Reproductive/Obstetrics                           Anesthesia Physical Anesthesia Plan  ASA: IV  Anesthesia Plan:    Post-op Pain Management:    Induction: Intravenous  Airway Management Planned: Simple Face Mask  Additional Equipment:   Intra-op Plan:   Post-operative Plan:   Informed Consent: I have reviewed the patients History and Physical, chart, labs and discussed the procedure including the risks, benefits and alternatives for the proposed anesthesia with the patient or authorized representative who has indicated his/her understanding and acceptance.     Plan Discussed with:   Anesthesia Plan Comments:         Anesthesia Quick Evaluation

## 2013-02-16 NOTE — Anesthesia Procedure Notes (Addendum)
Procedure Name: MAC Date/Time: 02/16/2013 7:38 AM Performed by: Jerilee Hoh Pre-anesthesia Checklist: Patient identified, Emergency Drugs available, Suction available and Patient being monitored Patient Re-evaluated:Patient Re-evaluated prior to inductionOxygen Delivery Method: Simple face mask Intubation Type: IV induction Placement Confirmation: positive ETCO2 and breath sounds checked- equal and bilateral   Procedure Name: LMA Insertion Date/Time: 02/16/2013 9:30 AM Performed by: Jerilee Hoh Pre-anesthesia Checklist: Patient identified, Emergency Drugs available, Suction available and Patient being monitored Patient Re-evaluated:Patient Re-evaluated prior to inductionOxygen Delivery Method: Circle system utilized Preoxygenation: Pre-oxygenation with 100% oxygen LMA: LMA with gastric port inserted LMA Size: 4.0 Number of attempts: 1 Placement Confirmation: positive ETCO2 and breath sounds checked- equal and bilateral Tube secured with: Tape Dental Injury: Teeth and Oropharynx as per pre-operative assessment

## 2013-02-16 NOTE — Op Note (Signed)
NAME: [name]   MRN: [id number] DOB: 12/23/1955    DATE OF OPERATION: 02/16/2013  PREOP DIAGNOSIS: infected left forearm AV grafts  POSTOP DIAGNOSIS: same  PROCEDURE: removal of 2 infected left forearm AV graft and vein patch angioplasty of the left brachial artery  SURGEON: Di Kindle. Edilia Bo, MD, FACS  ASSIST: Doreatha Massed, PA  ANESTHESIA: Gen.   EBL: 100 cc  INDICATIONS: [name] is a 57 y.o. male who has a functioning left thigh AV graft. He presented to the office with drainage from his left forearm where he has some nonfunctioning grafts. He was brought in for exploration and possible removal of his grafts.  FINDINGS: there were 2 grafts in the forearm and both of these were removed. The more medial graft was clearly infected.  TECHNIQUE: The patient was taken to the operating room and initially was sedated and the procedure was done under local anesthesia. However with it was later converted to general anesthetic. The left upper extremity was prepped and draped in usual sterile fashion. After the skin was anesthetized with 1% lidocaine, a transverse incision at the antecubital level was opened and here there was gross purulence around the venous limb of the graft. This was dissected free and divided. The venous limb extended up to the mid upper arm and therefore a separate longitudinal incision was made over the old venous anastomosis after the skin was anesthetized. The graft here was dissected free where it was anastomosed to the brachial vein. I dissected up higher on the vein where the vein was ligated and the segment of vein was excised to be used as a vein patch. The graft between the 2 incisions was removed without difficulty as it was infected and not incorporated. An additional incision was made along the medial aspect of the forearm on the right where the graft was again dissected free. The graft was however fairly stuck but did show evidence of infection. There  was an additional graft which was further laterally which I elected to remove also but was able to do this through the same 2 incisions. The lateral aspect of the former separate incision was made and through these incisions I was able to remove all of the grafts completely. The remnant of graft attached to the brachial artery was fairly stuck in the dissection quite difficult. This reason I elected to use a tourniquet. The patient received 8000 units of IV heparin. Tourniquet was placed on the upper arm. The arm was exsanguinated with an Esmarch bandage and the tourniquet inflated to 250 mm mercury. Under tourniquet control the graft was removed entirely from the brachial artery. The vein patch was then fashioned and sewn with continuous 6-0 Prolene suture. Prior to completing the anastomosis the tourniquet was released and the artery back bled and flushed appropriately and  the anastomosis was completed. At this point there was a good radial and ulnar signal with the Doppler. Hemostasis was obtained once the heparin was partially reversed with protamine. There was some oozing from the tunnel. The incision again the cubital level was irrigated after hemostasis was obtained and this was closed with a pair of 3-0 Vicryl. The skin was closed with interrupted 30 nylons. Incision in the mid upper arm was closed with deep layer 3-0 Vicryl and the skin closed with 4-0 Vicryl. Incision along the ulnar aspect of the forearm was closed with interrupted 30 nylons to allow for some drainage. Incision on the lateral aspect of the forearm was  closed with a deep layer of 3-0 Vicryl and skin closed with 4-0 Vicryl. A sterile dressing was applied. The patient tolerated the procedure well and was transferred to the recovery room in stable condition. All needle and sponge counts were correct.  Waverly Ferrari, MD, FACS [facility]  DATE OF DICTATION:   02/16/2013

## 2013-02-16 NOTE — Telephone Encounter (Addendum)
Message copied by Fredrich Birks on Thu Feb 16, 2013  1:16 PM ------      Message from: Lorin Mercy K      Created: Thu Feb 16, 2013 11:21 AM      Regarding: schedule                   ----- Message -----         From: Dara Lords, PA-C         Sent: 02/16/2013  10:48 AM           To: Sharee Pimple, CMA            S/p removal of AVGG x 2 left arm 02/16/13.  F/u in 1 week on 02/22/13 with Dr. Edilia Bo. (Probably has HD that day)            Thanks,      Samantha ------  02/16/13: spoke with pt, dpm

## 2013-02-16 NOTE — Preoperative (Signed)
Beta Blockers   Reason not to administer Beta Blockers:Not Applicable 

## 2013-02-16 NOTE — Transfer of Care (Signed)
Immediate Anesthesia Transfer of Care Note  Patient: [name]  Procedure(s) Performed: Procedure(s) with comments: EXCISION OF LEFT ARM ARTERIOVENOUS GORETEX GRAFT TIMES 2 WITH VEIN PATCH ANGIOPLASTY OF BRACIAL ARTERY.   (Left) - Converted from MAC to General.    Patient Location: PACU  Anesthesia Type:General  Level of Consciousness: awake, alert , oriented and pateint uncooperative  Airway & Oxygen Therapy: Patient Spontanous Breathing and Patient connected to face mask oxygen  Post-op Assessment: Report given to PACU RN, Post -op Vital signs reviewed and stable and Patient moving all extremities  Post vital signs: Reviewed and stable  Complications: No apparent anesthesia complications

## 2013-02-16 NOTE — Anesthesia Postprocedure Evaluation (Signed)
  Anesthesia Post-op Note  Patient: [name]  Procedure(s) Performed: Procedure(s) with comments: EXCISION OF LEFT ARM ARTERIOVENOUS GORETEX GRAFT TIMES 2 WITH VEIN PATCH ANGIOPLASTY OF BRACIAL ARTERY.   (Left) - Converted from MAC to General.    Patient Location: PACU  Anesthesia Type:General  Level of Consciousness: awake  Airway and Oxygen Therapy: Patient Spontanous Breathing  Post-op Pain: mild  Post-op Assessment: Post-op Vital signs reviewed  Post-op Vital Signs: Reviewed  Complications: No apparent anesthesia complications

## 2013-02-16 NOTE — H&P (View-Only) (Signed)
The patient presents today for evaluation of left arm infection. He is well known to me from a recent replacement of half of his femoral graft on the left femoral artery on 12/14/2012. This is all well healed and he is has excellent function with no problems with hemodialysis. He reports that over the base he had a swelling in the left antecubital space and had spontaneous drainage of pus from this. He denies any fever or chills.  Past Medical History  Diagnosis Date  . Anxiety   . Back pain   . GERD (gastroesophageal reflux disease)   . Peripheral neuropathy   . Arthritis   . CHF (congestive heart failure)   . Active smoker   . Dialysis patient     M-W-F  . Renal failure     MWF dialysis at [facility]  . Diabetes mellitus     diet controlled  . 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 PONV (postoperative nausea and vomiting)   . Hypertension   . Bell palsy   . Heart murmur   . Shortness of breath     with ambulation  . Carpal tunnel syndrome     bilateral  . Hemodialysis patient     M,W,F  . Dysrhythmia     Hx: of palpitataions  . Headache(784.0)     Hx: of Migraines    History  Substance Use Topics  . Smoking status: Current Every Day Smoker -- 1.00 packs/day for 40 years    Types: Cigarettes  . Smokeless tobacco: Never Used     Comment: pt states that he wants to quit  . Alcohol Use: No    Family History  Problem Relation Age of Onset  . Diabetes Mother   . Stroke Mother   . Heart disease Mother   . Kidney disease Father   . Hyperlipidemia Father     No Known Allergies  Current outpatient prescriptions:amLODipine (NORVASC) 10 MG tablet, Take 10 mg by mouth daily., Disp: , Rfl: ;  aspirin 325 MG EC tablet, Take 325 mg by mouth daily.  , Disp: , Rfl: ;  b complex-vitamin c-folic acid (NEPHRO-VITE) 0.8 MG TABS, Take 0.8 mg by mouth at bedtime. , Disp: , Rfl: ;  calcium acetate (PHOSLO) 667 MG capsule, Take 2,668 mg by mouth 3 (three) times daily with  meals.  , Disp: , Rfl:  colchicine 0.6 MG tablet, Take 0.6 mg by mouth 2 (two) times daily as needed. For gout, Disp: , Rfl: ;  cyclobenzaprine (FLEXERIL) 5 MG tablet, Take 1 tablet (5 mg total) by mouth 2 (two) times daily as needed for muscle spasms., Disp: 20 tablet, Rfl: 0;  diphenhydrAMINE (BENADRYL) 25 MG tablet, Take 25 mg by mouth every 6 (six) hours as needed. For itching, Disp: , Rfl:  esomeprazole (NEXIUM) 40 MG capsule, Take 40 mg by mouth every evening. , Disp: , Rfl: ;  gabapentin (NEURONTIN) 100 MG capsule, Take 100 mg by mouth at bedtime as needed. For nerve pain, Disp: , Rfl: ;  oxyCODONE (OXY IR/ROXICODONE) 5 MG immediate release tablet, Take 1 tablet (5 mg total) by mouth every 6 (six) hours as needed. For pain, Disp: 30 tablet, Rfl: 0 oxyCODONE-acetaminophen (ROXICET) 5-325 MG per tablet, Take 1-2 tablets by mouth every 4 (four) hours as needed for pain., Disp: 30 tablet, Rfl: 0;  Sevelamer Carbonate (RENVELA) 2.4 G PACK, Take 2.4 g by mouth 3 (three) times daily as  needed. To control potassium level, Disp: , Rfl: ;  sodium bicarbonate 650 MG tablet, Take 650 mg by mouth 2 (two) times daily.  , Disp: , Rfl:  sulindac (CLINORIL) 200 MG tablet, Take 200 mg by mouth 2 (two) times daily., Disp: , Rfl:   BP 121/81  Pulse 73  Ht 5\' 11"  (1.803 m)  Wt 280 lb 4.8 oz (127.143 kg)  BMI 39.11 kg/m2  SpO2 96%  Body mass index is 39.11 kg/(m^2).       Physical exam a well-nourished gentleman in no acute distress. Left femoral loop graft as excellent thrill with no evidence of erythema or infection. Left arm shows a multiple old grafts to include several form loops and an upper arm straight graft as well. This does appear to be away from the area of his brachial artery. This is more on the ulnar aspect of his antecubital space. He has an open area with pus draining from this.  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 I explained the need to excise at  least this portion of his graft. He has multiple forearm and an upper arm nonfunctional graft as well. These are completely incorporated and not in acute of the involved. I explained the option of attempting to remove all prosthetic graft from his left arm versus removing the area this involved. I did explain the magnitude of the removing all Gore-Tex versus removing the area that is involved and watching this closely for recurrent infection. He wishes to proceed with surgery for removal of infected graft. He understands the decision for the extent of removal will be determined at surgery. He dialyzes on Monday Wednesday and Friday and therefore will have surgery on Thursday at [facility] as an outpatient

## 2013-02-17 ENCOUNTER — Encounter (HOSPITAL_COMMUNITY): Payer: Self-pay | Admitting: Vascular Surgery

## 2013-02-18 LAB — WOUND CULTURE: Culture: NO GROWTH

## 2013-02-19 ENCOUNTER — Emergency Department (HOSPITAL_COMMUNITY)
Admission: EM | Admit: 2013-02-19 | Discharge: 2013-02-19 | Disposition: A | Discharge status: Home / Self Care | Payer: Medicare Other | Attending: Emergency Medicine | Admitting: Emergency Medicine

## 2013-02-19 ENCOUNTER — Encounter (HOSPITAL_COMMUNITY): Payer: Self-pay | Admitting: Emergency Medicine

## 2013-02-19 DIAGNOSIS — Z792 Long term (current) use of antibiotics: Secondary | ICD-10-CM | POA: Insufficient documentation

## 2013-02-19 DIAGNOSIS — I509 Heart failure, unspecified: Secondary | ICD-10-CM | POA: Insufficient documentation

## 2013-02-19 DIAGNOSIS — Z992 Dependence on renal dialysis: Secondary | ICD-10-CM | POA: Insufficient documentation

## 2013-02-19 DIAGNOSIS — Z9889 Other specified postprocedural states: Secondary | ICD-10-CM

## 2013-02-19 DIAGNOSIS — G8918 Other acute postprocedural pain: Secondary | ICD-10-CM | POA: Insufficient documentation

## 2013-02-19 DIAGNOSIS — Z79899 Other long term (current) drug therapy: Secondary | ICD-10-CM | POA: Insufficient documentation

## 2013-02-19 DIAGNOSIS — M129 Arthropathy, unspecified: Secondary | ICD-10-CM | POA: Insufficient documentation

## 2013-02-19 DIAGNOSIS — Z7982 Long term (current) use of aspirin: Secondary | ICD-10-CM | POA: Insufficient documentation

## 2013-02-19 DIAGNOSIS — R011 Cardiac murmur, unspecified: Secondary | ICD-10-CM | POA: Insufficient documentation

## 2013-02-19 DIAGNOSIS — F17200 Nicotine dependence, unspecified, uncomplicated: Secondary | ICD-10-CM | POA: Insufficient documentation

## 2013-02-19 DIAGNOSIS — I120 Hypertensive chronic kidney disease with stage 5 chronic kidney disease or end stage renal disease: Secondary | ICD-10-CM | POA: Insufficient documentation

## 2013-02-19 DIAGNOSIS — E119 Type 2 diabetes mellitus without complications: Secondary | ICD-10-CM | POA: Insufficient documentation

## 2013-02-19 DIAGNOSIS — F411 Generalized anxiety disorder: Secondary | ICD-10-CM | POA: Insufficient documentation

## 2013-02-19 DIAGNOSIS — N186 End stage renal disease: Secondary | ICD-10-CM | POA: Insufficient documentation

## 2013-02-19 DIAGNOSIS — K219 Gastro-esophageal reflux disease without esophagitis: Secondary | ICD-10-CM | POA: Insufficient documentation

## 2013-02-19 DIAGNOSIS — M25539 Pain in unspecified wrist: Secondary | ICD-10-CM | POA: Insufficient documentation

## 2013-02-19 MED ORDER — HYDROCODONE-ACETAMINOPHEN 5-325 MG PO TABS
2.0000 | ORAL_TABLET | Freq: Once | ORAL | Status: AC
  Administered 2013-02-19: 2 via ORAL
  Filled 2013-02-19: qty 2

## 2013-02-19 NOTE — ED Provider Notes (Signed)
CSN: [id number]     Arrival date & time 02/19/13  1325 History   First MD Initiated Contact with Patient 02/19/13 1515     Chief Complaint  Patient presents with  . Hand Problem   (Consider location/radiation/quality/duration/timing/severity/associated sxs/prior Treatment) HPI Comments: Pt with infected graft of left UE and had surgery to "remove 2 veins" 4 days ago.  Pt denies fevers. He admits he has not been keeping arm elevated.  Has not changed dressings. Has followup scheduled on Wednesday. Was wrapped in ACE wrap.  Felt like hand was much more swollen today and somewhat more painful.  Has a h/o gout, doesn't feel like gout.  Can bend at shoulder, elbow, wrist and make a fist without too much difficulty.  Is on an antibiotic at home.  No known drainage.    The history is provided by the patient and the spouse.    Past Medical History  Diagnosis Date  . Anxiety   . Back pain   . GERD (gastroesophageal reflux disease)   . Peripheral neuropathy   . Arthritis   . CHF (congestive heart failure)   . Active smoker   . Dialysis patient     M-W-F  . Renal failure     MWF dialysis at [facility]  . Diabetes mellitus     diet controlled  . 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 PONV (postoperative nausea and vomiting)   . Hypertension   . Bell palsy   . Heart murmur   . Shortness of breath     with ambulation  . Carpal tunnel syndrome     bilateral  . Hemodialysis patient     M,W,F  . Dysrhythmia     Hx: of palpitataions  . Headache(784.0)     Hx: of Migraines   Past Surgical History  Procedure Laterality Date  . Total hip arthroplasty    . Thyroidectomy    . Tooth extraction    . Mandible fracture surgery    . Dg av dialysis graft declot or    . Insertion of dialysis catheter  03/28/2011    Procedure: INSERTION OF DIALYSIS CATHETER;  Surgeon: Pryor Ochoa, MD;  Location: [facility] OR;  Service: Vascular;  Laterality: Left;  . Av fistula placement  03/31/2011     Procedure: INSERTION OF ARTERIOVENOUS (AV) GORE-TEX GRAFT THIGH;  Surgeon: Sherren Kerns, MD;  Location: [facility] OR;  Service: Vascular;  Laterality: Right;  redo right thigh arteriovenous gortex graft using gore-tex stretch 6mm x 70cm  . Thrombectomy w/ embolectomy  06/02/2011    Procedure: THROMBECTOMY ARTERIOVENOUS GORE-TEX GRAFT;  Surgeon: Chuck Hint, MD;  Location: [facility] OR;  Service: Vascular;  Laterality: Right;  Thrombectomy right thigh arteriovenous gortex graft;  revision by  replacement of large portion of graft with 7mm gore-tex   . Insertion of dialysis catheter  08/28/2011    Procedure: INSERTION OF DIALYSIS CATHETER;  Surgeon: Fransisco Hertz, MD;  Location: [facility] OR;  Service: Vascular;  Laterality: Left;  Atempted Bilateral Internal Jugular, Bilateral Subclavin insertion of 55cm Dialysis Catheter Left Femoral  . Insertion of dialysis catheter  12/15/2011    Procedure: INSERTION OF DIALYSIS CATHETER;  Surgeon: Sherren Kerns, MD;  Location: [facility] OR;  Service: Vascular;  Laterality: Right;  Insertion of Right Femoral Dialysis Catheter  . Exchange of a dialysis catheter  02/26/2012    Procedure: EXCHANGE OF A DIALYSIS CATHETER;  Surgeon: Tawanna Cooler  Shirline Frees, MD;  Location: [facility] OR;  Service: Vascular;  Laterality: Right;  . Joint replacement    . Exchange of a dialysis catheter  05/18/2012    Procedure: EXCHANGE OF A DIALYSIS CATHETER;  Surgeon: Larina Earthly, MD;  Location: [name] OR;  Service: Vascular;  Laterality: Right;  right femoral dialysis catheter  . Av fistula placement  05/18/2012    Procedure: INSERTION OF ARTERIOVENOUS (AV) GORE-TEX GRAFT THIGH;  Surgeon: Larina Earthly, MD;  Location: [facility] OR;  Service: Vascular;  Laterality: Left;  using 6mm by 70cm goretex graft  . Thrombectomy w/ embolectomy Left 08/25/2012    Procedure: THROMBECTOMY ARTERIOVENOUS GORE-TEX GRAFT;  Surgeon: Pryor Ochoa, MD;  Location: [facility] OR;  Service: Vascular;  Laterality: Left;  Attempted thrombectomy of left thigh  arteriovenous gortex graft.   . Av fistula placement Left 08/25/2012    Procedure: INSERTION OF ARTERIOVENOUS (AV) GORE-TEX GRAFT THIGH;  Surgeon: Pryor Ochoa, MD;  Location: [facility] OR;  Service: Vascular;  Laterality: Left;  Using 6mm x 40cm vascular Gortex graft.  . Revision of arteriovenous goretex graft Left 12/14/2012    Procedure: Revision of Left Thigh Graft;  Surgeon: Larina Earthly, MD;  Location: [facility] OR;  Service: Vascular;  Laterality: Left;  . Avgg removal Left 02/16/2013    Procedure: EXCISION OF LEFT ARM ARTERIOVENOUS GORETEX GRAFT TIMES 2 WITH VEIN PATCH ANGIOPLASTY OF BRACIAL ARTERY.  ;  Surgeon: Chuck Hint, MD;  Location: [facility] OR;  Service: Vascular;  Laterality: Left;  Converted from MAC to General.     Family History  Problem Relation Age of Onset  . Diabetes Mother   . Stroke Mother   . Heart disease Mother   . Kidney disease Father   . Hyperlipidemia Father    History  Substance Use Topics  . Smoking status: Current Every Day Smoker -- 1.00 packs/day for 40 years    Types: Cigarettes  . Smokeless tobacco: Never Used     Comment: pt states that he wants to quit  . Alcohol Use: No    Review of Systems  Constitutional: Negative for fever and chills.  Respiratory: Negative for chest tightness and shortness of breath.   Cardiovascular: Negative for chest pain.  Musculoskeletal: Positive for arthralgias, joint swelling and myalgias.  Skin: Negative for color change and wound.    Allergies  Review of patient's allergies indicates no known allergies.  Home Medications   Current Outpatient Rx  Name  Route  Sig  Dispense  Refill  . amLODipine (NORVASC) 10 MG tablet   Oral   Take 10 mg by mouth daily.         Marland Kitchen aspirin 325 MG EC tablet   Oral   Take 325 mg by mouth daily.           Marland Kitchen b complex-vitamin c-folic acid (NEPHRO-VITE) 0.8 MG TABS   Oral   Take 0.8 mg by mouth at bedtime.          . calcium acetate (PHOSLO) 667 MG capsule   Oral   Take  2,668 mg by mouth 3 (three) times daily with meals.           . colchicine 0.6 MG tablet   Oral   Take 0.6 mg by mouth 2 (two) times daily as needed. For gout         . cyclobenzaprine (FLEXERIL) 5 MG tablet   Oral   Take 1 tablet (5 mg total) by mouth  2 (two) times daily as needed for muscle spasms.   20 tablet   0   . diphenhydrAMINE (BENADRYL) 25 MG tablet   Oral   Take 25 mg by mouth every 6 (six) hours as needed. For itching         . doxycycline (VIBRA-TABS) 100 MG tablet   Oral   Take 1 tablet (100 mg total) by mouth 2 (two) times daily.   20 tablet   0   . esomeprazole (NEXIUM) 40 MG capsule   Oral   Take 40 mg by mouth every evening.          . gabapentin (NEURONTIN) 100 MG capsule   Oral   Take 100 mg by mouth at bedtime as needed. For nerve pain         . oxyCODONE (OXY IR/ROXICODONE) 5 MG immediate release tablet   Oral   Take 1-2 tablets (5-10 mg total) by mouth every 6 (six) hours as needed. For pain   30 tablet   0   . sodium bicarbonate 650 MG tablet   Oral   Take 650 mg by mouth 2 (two) times daily.            BP 123/69  Pulse 82  Temp(Src) 97.7 F (36.5 C) (Oral)  Resp 22  SpO2 98% Physical Exam  Nursing note and vitals reviewed. Constitutional: He is oriented to person, place, and time. He appears well-developed and well-nourished. No distress.  HENT:  Head: Normocephalic and atraumatic.  Cardiovascular:  Pulses:      Radial pulses are 2+ on the left side.  Pulmonary/Chest: Effort normal.  Musculoskeletal:       Left wrist: He exhibits tenderness and swelling. He exhibits normal range of motion and no deformity.       Left forearm: He exhibits no tenderness.       Left hand: He exhibits swelling. He exhibits normal capillary refill. Normal sensation noted. Normal strength noted.  Left forearm and upper arm incisions are intact, area left open was not draining due to old bandages, once removed, some purulent drainage  immediately came out.  No surrounding erythema  Neurological: He is alert and oriented to person, place, and time.  Skin: Skin is warm and dry. No bruising noted. He is not diaphoretic. No cyanosis. No pallor.    ED Course  Procedures (including critical care time) Labs Review Labs Reviewed - No data to display Imaging Review No results found.  EKG Interpretation   None      ra sat is 98% and I interpret to be normal   MDM   1. Post-operative state      Pt's wound is draining, no evidence of cellulitis, good distal pulses, no evidence of compartment syndrome.  Pt has not been keeping arm elevated.  Dressing changed to allow continued drainage.  Pt is stable for discharge and can keep scheduled appt on Wednesday    Gavin Pound. Lonisha Bobby, MD 02/19/13 1541

## 2013-02-19 NOTE — ED Notes (Addendum)
Per pt having left hand swelling for 2 days. sts that he had a graft removed out of that arm Thursday. sts had an ace bandage on his arm that he took off this am.

## 2013-02-19 NOTE — ED Notes (Addendum)
States have not been keeping left upper extremity elevated stated understands importance and will keep left arm elevated. Changed bandaged cleaned with wound cleanser applied three 4x4 gauze and Kerlix. Tolerated without incident.

## 2013-02-19 NOTE — Discharge Instructions (Signed)
 Change dressings twice daily, keep ares clean and dry.  Keep your arm elevated as instructed.  Follow up with your doctor on Wednesday.  Watch for increasing drainage, fevers and if these develop, contact your surgeon.

## 2013-02-20 ENCOUNTER — Telehealth: Payer: Self-pay

## 2013-02-20 NOTE — Telephone Encounter (Signed)
Contacted pt. Per phone.  Informed that [facility] will be contacting him to schedule an appt. To check his incision and change the bandage tomorrow morning.  Asked pt. If he could come to the clinic today for a dressing change.  Stated "I live in [place] and don't have the money for gas to drive to [place] today."  Encouraged pt. To keep the incisional area clean and dry, and to expect a phone call from [facility].  Verb. Understanding.

## 2013-02-20 NOTE — Telephone Encounter (Signed)
Rec'd phone call from nurse at [facility] center.  Stated pt. has a dressing on the left arm, and is requesting to have it changed.  Stated that the kidney centers do not do dressing changes. Phone call to the patient.  Stated he was seen in the ER on Sunday, and told to change the left arm dressing daily.  Questioned if he has someone at home that can change the dressing?   Stated he doesn't have anyone that he can depend on to help him with that.  Phone call to [facility] @ [facility].   Requested to have an RN do a wound assessment and change the dressing/ and possibly instruct patient on how to redress the surgical site.  Per Tamela Oddi, a nurse is not available for start of care until tomorrow, but will initiate his care in the morning.  Attempted to call pt.; left voice message that pt. Will have a nurse from [facility] call him to arrange appt. tomorrow morning.  Advised pt. to call office this afternoon, to discuss current status of dressing.

## 2013-02-21 ENCOUNTER — Encounter: Payer: Self-pay | Admitting: Vascular Surgery

## 2013-02-21 LAB — ANAEROBIC CULTURE

## 2013-02-22 ENCOUNTER — Encounter: Payer: Medicare Other | Admitting: Vascular Surgery

## 2013-02-28 ENCOUNTER — Encounter: Payer: Self-pay | Admitting: Vascular Surgery

## 2013-03-01 ENCOUNTER — Encounter: Payer: Self-pay | Admitting: Vascular Surgery

## 2013-03-01 ENCOUNTER — Ambulatory Visit (INDEPENDENT_AMBULATORY_CARE_PROVIDER_SITE_OTHER): Payer: Self-pay | Admitting: Vascular Surgery

## 2013-03-01 VITALS — BP 121/68 | HR 80 | Ht 71.0 in | Wt 278.0 lb

## 2013-03-01 DIAGNOSIS — T82898A Other specified complication of vascular prosthetic devices, implants and grafts, initial encounter: Secondary | ICD-10-CM

## 2013-03-01 DIAGNOSIS — N186 End stage renal disease: Secondary | ICD-10-CM

## 2013-03-01 NOTE — Progress Notes (Signed)
Patient name: [name] MRN: [id number] DOB: 1955-10-22 Sex: male  REASON FOR VISIT: follow up after removal of nonfunctioning left forearm AV grafts.  HPI: [name] is a 57 y.o. male who has a functioning left thigh AV graft. He presented to the office with drainage from his left forearm where he had some nonfunctioning graft. He was brought to the operating room on 02/16/2013 where there were 2 grafts in the forearm that were infected and both were removed. Vein patch angioplasty of the left brachial artery was performed. He comes in for a follow up visit. He denies fever or chills.  REVIEW OF SYSTEMS: Arly.Keller ] denotes positive finding; [  ] denotes negative finding  CARDIOVASCULAR:  [ ]  chest pain   [ ]  dyspnea on exertion    CONSTITUTIONAL:  [ ]  fever   [ ]  chills  PHYSICAL EXAM: Filed Vitals:   03/01/13 1536  BP: 121/68  Pulse: 80  Height: 5\' 11"  (1.803 m)  Weight: 278 lb (126.1 kg)  SpO2: 99%   Body mass index is 38.79 kg/(m^2). GENERAL: The patient is a well-nourished male, in no acute distress. The vital signs are documented above. CARDIOVASCULAR: There is a regular rate and rhythm  PULMONARY: There is good air exchange bilaterally without wheezing or rales. His incisions are healing adequately. The incisions had nylon sutures and we removed these in the office today to  MEDICAL ISSUES: The patient is doing well status post removal of 2 nonfunctioning left forearm graft. I will see him back as needed.  Alecxis Baltzell S [facility] [place] Beeper: [phone]

## 2013-05-21 ENCOUNTER — Encounter (HOSPITAL_COMMUNITY)
Admission: EM | Disposition: A | Discharge status: Home Health | Payer: Medicare Other | Source: Other Acute Inpatient Hospital | Attending: Cardiovascular Disease

## 2013-05-21 ENCOUNTER — Ambulatory Visit (HOSPITAL_COMMUNITY): Payer: Medicare Other | Admitting: Anesthesiology

## 2013-05-21 ENCOUNTER — Encounter (HOSPITAL_COMMUNITY): Payer: Self-pay | Admitting: *Deleted

## 2013-05-21 ENCOUNTER — Inpatient Hospital Stay (HOSPITAL_COMMUNITY)
Admission: EM | Admit: 2013-05-21 | Discharge: 2013-05-24 | DRG: 246 | Disposition: A | Discharge status: Home Health | Payer: Medicare Other | Source: Other Acute Inpatient Hospital | Attending: Cardiovascular Disease | Admitting: Cardiovascular Disease

## 2013-05-21 ENCOUNTER — Encounter (HOSPITAL_COMMUNITY): Payer: Medicare Other | Admitting: Anesthesiology

## 2013-05-21 DIAGNOSIS — G8929 Other chronic pain: Secondary | ICD-10-CM | POA: Diagnosis present

## 2013-05-21 DIAGNOSIS — D649 Anemia, unspecified: Secondary | ICD-10-CM | POA: Diagnosis not present

## 2013-05-21 DIAGNOSIS — I10 Essential (primary) hypertension: Secondary | ICD-10-CM

## 2013-05-21 DIAGNOSIS — I219 Acute myocardial infarction, unspecified: Secondary | ICD-10-CM | POA: Diagnosis present

## 2013-05-21 DIAGNOSIS — Z992 Dependence on renal dialysis: Secondary | ICD-10-CM

## 2013-05-21 DIAGNOSIS — I2119 ST elevation (STEMI) myocardial infarction involving other coronary artery of inferior wall: Principal | ICD-10-CM

## 2013-05-21 DIAGNOSIS — Z79899 Other long term (current) drug therapy: Secondary | ICD-10-CM

## 2013-05-21 DIAGNOSIS — Z7982 Long term (current) use of aspirin: Secondary | ICD-10-CM

## 2013-05-21 DIAGNOSIS — E875 Hyperkalemia: Secondary | ICD-10-CM | POA: Diagnosis present

## 2013-05-21 DIAGNOSIS — G609 Hereditary and idiopathic neuropathy, unspecified: Secondary | ICD-10-CM | POA: Diagnosis present

## 2013-05-21 DIAGNOSIS — F411 Generalized anxiety disorder: Secondary | ICD-10-CM | POA: Diagnosis present

## 2013-05-21 DIAGNOSIS — Z955 Presence of coronary angioplasty implant and graft: Secondary | ICD-10-CM

## 2013-05-21 DIAGNOSIS — Z9861 Coronary angioplasty status: Secondary | ICD-10-CM

## 2013-05-21 DIAGNOSIS — F17200 Nicotine dependence, unspecified, uncomplicated: Secondary | ICD-10-CM | POA: Diagnosis present

## 2013-05-21 DIAGNOSIS — I2510 Atherosclerotic heart disease of native coronary artery without angina pectoris: Secondary | ICD-10-CM

## 2013-05-21 DIAGNOSIS — I120 Hypertensive chronic kidney disease with stage 5 chronic kidney disease or end stage renal disease: Secondary | ICD-10-CM | POA: Diagnosis present

## 2013-05-21 DIAGNOSIS — E785 Hyperlipidemia, unspecified: Secondary | ICD-10-CM | POA: Diagnosis present

## 2013-05-21 DIAGNOSIS — E119 Type 2 diabetes mellitus without complications: Secondary | ICD-10-CM | POA: Diagnosis present

## 2013-05-21 DIAGNOSIS — D72829 Elevated white blood cell count, unspecified: Secondary | ICD-10-CM | POA: Diagnosis present

## 2013-05-21 DIAGNOSIS — E669 Obesity, unspecified: Secondary | ICD-10-CM | POA: Diagnosis present

## 2013-05-21 DIAGNOSIS — N186 End stage renal disease: Secondary | ICD-10-CM

## 2013-05-21 DIAGNOSIS — K219 Gastro-esophageal reflux disease without esophagitis: Secondary | ICD-10-CM | POA: Diagnosis present

## 2013-05-21 DIAGNOSIS — N2581 Secondary hyperparathyroidism of renal origin: Secondary | ICD-10-CM | POA: Diagnosis present

## 2013-05-21 DIAGNOSIS — Z96649 Presence of unspecified artificial hip joint: Secondary | ICD-10-CM

## 2013-05-21 DIAGNOSIS — I959 Hypotension, unspecified: Secondary | ICD-10-CM | POA: Diagnosis not present

## 2013-05-21 DIAGNOSIS — I509 Heart failure, unspecified: Secondary | ICD-10-CM | POA: Diagnosis present

## 2013-05-21 DIAGNOSIS — I2129 ST elevation (STEMI) myocardial infarction involving other sites: Secondary | ICD-10-CM | POA: Diagnosis present

## 2013-05-21 HISTORY — DX: Presence of coronary angioplasty implant and graft: Z95.5

## 2013-05-21 HISTORY — PX: LEFT HEART CATH: SHX5478

## 2013-05-21 HISTORY — DX: Dependence on renal dialysis: N18.6

## 2013-05-21 HISTORY — DX: Dependence on renal dialysis: Z99.2

## 2013-05-21 HISTORY — PX: PERCUTANEOUS CORONARY STENT INTERVENTION (PCI-S): SHX5485

## 2013-05-21 LAB — BASIC METABOLIC PANEL
BUN: 36 mg/dL — AB (ref 6–23)
BUN: 39 mg/dL — AB (ref 6–23)
CALCIUM: 9.6 mg/dL (ref 8.4–10.5)
CALCIUM: 9.2 mg/dL (ref 8.4–10.5)
CO2: 19 mEq/L (ref 19–32)
CO2: 19 mEq/L (ref 19–32)
CREATININE: 11.37 mg/dL — AB (ref 0.50–1.35)
Chloride: 89 mEq/L — ABNORMAL LOW (ref 96–112)
Chloride: 90 mEq/L — ABNORMAL LOW (ref 96–112)
Creatinine, Ser: 11.01 mg/dL — ABNORMAL HIGH (ref 0.50–1.35)
GFR calc Af Amer: 5 mL/min — ABNORMAL LOW (ref >90)
GFR calc non Af Amer: 4 mL/min — ABNORMAL LOW (ref >90)
GFR, EST AFRICAN AMERICAN: 5 mL/min — AB (ref >90)
GFR, EST NON AFRICAN AMERICAN: 4 mL/min — AB (ref >90)
GLUCOSE: 74 mg/dL (ref 70–99)
Glucose, Bld: 99 mg/dL (ref 70–99)
POTASSIUM: 6.7 meq/L — AB (ref 3.7–5.3)
Potassium: 6.5 mEq/L (ref 3.7–5.3)
Sodium: 129 mEq/L — ABNORMAL LOW (ref 137–147)
Sodium: 129 mEq/L — ABNORMAL LOW (ref 137–147)

## 2013-05-21 LAB — CBC
HEMATOCRIT: 45.6 % (ref 39.0–52.0)
HEMOGLOBIN: 14.9 g/dL (ref 13.0–17.0)
MCH: 30.8 pg (ref 26.0–34.0)
MCHC: 32.7 g/dL (ref 30.0–36.0)
MCV: 94.2 fL (ref 78.0–100.0)
Platelets: 165 10*3/uL (ref 150–400)
RBC: 4.84 MIL/uL (ref 4.22–5.81)
RDW: 17.6 % — AB (ref 11.5–15.5)
WBC: 16.1 10*3/uL — ABNORMAL HIGH (ref 4.0–10.5)

## 2013-05-21 LAB — GLUCOSE, CAPILLARY: Glucose-Capillary: 75 mg/dL (ref 70–99)

## 2013-05-21 LAB — MRSA PCR SCREENING: MRSA BY PCR: NEGATIVE

## 2013-05-21 LAB — TROPONIN I: Troponin I: 8.71 ng/mL (ref <0.30)

## 2013-05-21 LAB — POCT ACTIVATED CLOTTING TIME: Activated Clotting Time: 227 seconds

## 2013-05-21 SURGERY — LEFT HEART CATH
Anesthesia: Monitor Anesthesia Care | Laterality: Bilateral

## 2013-05-21 MED ORDER — LABETALOL HCL 5 MG/ML IV SOLN
10.0000 mg | INTRAVENOUS | Status: DC | PRN
  Filled 2013-05-21: qty 4

## 2013-05-21 MED ORDER — ASPIRIN 81 MG PO CHEW
81.0000 mg | CHEWABLE_TABLET | Freq: Every day | ORAL | Status: DC
  Administered 2013-05-21 – 2013-05-24 (×4): 81 mg via ORAL
  Filled 2013-05-21 (×5): qty 1

## 2013-05-21 MED ORDER — METOPROLOL TARTRATE 25 MG PO TABS
25.0000 mg | ORAL_TABLET | Freq: Two times a day (BID) | ORAL | Status: DC
  Administered 2013-05-22 – 2013-05-24 (×6): 25 mg via ORAL
  Filled 2013-05-21 (×7): qty 1

## 2013-05-21 MED ORDER — GABAPENTIN 100 MG PO CAPS
100.0000 mg | ORAL_CAPSULE | Freq: Every evening | ORAL | Status: DC | PRN
  Administered 2013-05-21: 100 mg via ORAL
  Filled 2013-05-21: qty 1

## 2013-05-21 MED ORDER — TICAGRELOR 90 MG PO TABS
90.0000 mg | ORAL_TABLET | Freq: Two times a day (BID) | ORAL | Status: DC
  Administered 2013-05-22 – 2013-05-24 (×5): 90 mg via ORAL
  Filled 2013-05-21 (×7): qty 1

## 2013-05-21 MED ORDER — ACETAMINOPHEN 325 MG PO TABS
650.0000 mg | ORAL_TABLET | ORAL | Status: DC | PRN
  Administered 2013-05-22 – 2013-05-23 (×5): 650 mg via ORAL
  Filled 2013-05-21 (×5): qty 2

## 2013-05-21 MED ORDER — TICAGRELOR 90 MG PO TABS
180.0000 mg | ORAL_TABLET | ORAL | Status: AC
  Administered 2013-05-21: 180 mg via ORAL
  Filled 2013-05-21: qty 2

## 2013-05-21 MED ORDER — PROPOFOL INFUSION 10 MG/ML OPTIME
INTRAVENOUS | Status: DC | PRN
  Administered 2013-05-21: 50 ug/kg/min via INTRAVENOUS

## 2013-05-21 MED ORDER — HYDROMORPHONE HCL PF 1 MG/ML IJ SOLN
INTRAMUSCULAR | Status: AC
  Filled 2013-05-21: qty 1

## 2013-05-21 MED ORDER — FENTANYL CITRATE 0.05 MG/ML IJ SOLN
INTRAMUSCULAR | Status: AC
  Filled 2013-05-21: qty 2

## 2013-05-21 MED ORDER — MORPHINE SULFATE 4 MG/ML IJ SOLN
4.0000 mg | INTRAMUSCULAR | Status: DC | PRN
  Administered 2013-05-21 – 2013-05-23 (×8): 4 mg via INTRAVENOUS
  Filled 2013-05-21 (×8): qty 1

## 2013-05-21 MED ORDER — BIVALIRUDIN 250 MG IV SOLR
INTRAVENOUS | Status: AC
  Filled 2013-05-21: qty 250

## 2013-05-21 MED ORDER — ONDANSETRON HCL 4 MG/2ML IJ SOLN: 4.0000 mg | Freq: Four times a day (QID) | INTRAMUSCULAR | Status: DC | PRN

## 2013-05-21 MED ORDER — HEPARIN SODIUM (PORCINE) 5000 UNIT/ML IJ SOLN
5000.0000 [IU] | Freq: Three times a day (TID) | INTRAMUSCULAR | Status: DC
  Administered 2013-05-21 – 2013-05-24 (×8): 5000 [IU] via SUBCUTANEOUS
  Filled 2013-05-21 (×13): qty 1

## 2013-05-21 MED ORDER — PANTOPRAZOLE SODIUM 40 MG PO TBEC
40.0000 mg | DELAYED_RELEASE_TABLET | Freq: Every day | ORAL | Status: DC
  Administered 2013-05-21 – 2013-05-24 (×4): 40 mg via ORAL
  Filled 2013-05-21 (×3): qty 1

## 2013-05-21 MED ORDER — ATORVASTATIN CALCIUM 80 MG PO TABS
80.0000 mg | ORAL_TABLET | Freq: Every day | ORAL | Status: DC
  Administered 2013-05-21 – 2013-05-23 (×3): 80 mg via ORAL
  Filled 2013-05-21 (×4): qty 1

## 2013-05-21 MED ORDER — LIDOCAINE HCL (PF) 1 % IJ SOLN
INTRAMUSCULAR | Status: AC
  Filled 2013-05-21: qty 30

## 2013-05-21 MED ORDER — SODIUM POLYSTYRENE SULFONATE 15 GM/60ML PO SUSP: 15.0000 g | Freq: Once | ORAL | Status: DC

## 2013-05-21 MED ORDER — SODIUM BICARBONATE 650 MG PO TABS
650.0000 mg | ORAL_TABLET | Freq: Two times a day (BID) | ORAL | Status: DC
  Administered 2013-05-21 – 2013-05-24 (×6): 650 mg via ORAL
  Filled 2013-05-21 (×7): qty 1

## 2013-05-21 MED ORDER — SODIUM CHLORIDE 0.9 % IV SOLN: INTRAVENOUS | Status: DC

## 2013-05-21 MED ORDER — NITROGLYCERIN 0.2 MG/ML ON CALL CATH LAB
INTRAVENOUS | Status: AC
  Filled 2013-05-21: qty 1

## 2013-05-21 MED ORDER — PROPOFOL 10 MG/ML IV BOLUS
INTRAVENOUS | Status: DC | PRN
  Administered 2013-05-21 (×28): 10 mg via INTRAVENOUS
  Administered 2013-05-21: 20 mg via INTRAVENOUS
  Administered 2013-05-21 (×5): 10 mg via INTRAVENOUS

## 2013-05-21 MED ORDER — MIDAZOLAM HCL 2 MG/2ML IJ SOLN
INTRAMUSCULAR | Status: AC
  Filled 2013-05-21: qty 2

## 2013-05-21 MED ORDER — HEPARIN (PORCINE) IN NACL 2-0.9 UNIT/ML-% IJ SOLN
INTRAMUSCULAR | Status: AC
  Filled 2013-05-21: qty 1500

## 2013-05-21 MED ORDER — SODIUM CHLORIDE 0.9 % IV SOLN
INTRAVENOUS | Status: DC | PRN
  Administered 2013-05-21: 15:00:00 via INTRAVENOUS

## 2013-05-21 NOTE — Progress Notes (Signed)
Orthopedic Tech Progress Note Patient Details:  [name] 09/18/1955 [id number]  Ortho Devices Type of Ortho Device: Knee Immobilizer Ortho Device/Splint Location: RLE Ortho Device/Splint Interventions: Ordered;Application   Jennye Moccasin 05/21/2013, 4:32 PM

## 2013-05-21 NOTE — Transfer of Care (Signed)
Immediate Anesthesia Transfer of Care Note  Patient: [name]  Procedure(s) Performed: Procedure(s): LEFT HEART CATH (Bilateral) PERCUTANEOUS CORONARY STENT INTERVENTION (PCI-S)  Patient Location: ICU  Anesthesia Type:MAC  Level of Consciousness: sedated  Airway & Oxygen Therapy: Patient Spontanous Breathing  Post-op Assessment: Report given to PACU RN  Post vital signs: Reviewed and stable  Complications: No apparent anesthesia complications

## 2013-05-21 NOTE — Progress Notes (Signed)
Stat potassium level of 6.7 called to Dr Lowell Guitar orders given. Orders placed for MD in computer.

## 2013-05-21 NOTE — Anesthesia Postprocedure Evaluation (Signed)
  Anesthesia Post-op Note  Patient: [name]  Procedure(s) Performed: Procedure(s): LEFT HEART CATH (Bilateral) PERCUTANEOUS CORONARY STENT INTERVENTION (PCI-S)  Patient Location: ICU  Anesthesia Type:MAC  Level of Consciousness: awake  Airway and Oxygen Therapy: Patient Spontanous Breathing and Patient connected to nasal cannula oxygen  Post-op Pain: moderate, shoulder pain  Post-op Assessment: Post-op Vital signs reviewed, Patient's Cardiovascular Status Stable, Respiratory Function Stable, Patent Airway and No signs of Nausea or vomiting  Post-op Vital Signs: Reviewed and stable  Complications: No apparent anesthesia complications

## 2013-05-21 NOTE — CV Procedure (Signed)
Cardiac Catheterization Procedure Note  Name: [name] MRN: [id number] DOB: 1955-12-09  Procedure: Left Heart Cath, Selective Coronary Angiography, LV angiography,  PTCA/Stent of proximal LAD. Unsuccessful RCA PCI due to inability to cross the occlusion.   Indication: This is a 58 year old male with history of end-stage renal disease on hemodialysis for at least 17 years. He presented to [facility] emergency room with chest pain which started on Friday. He decided to seek medical attention after his mother told him to do so. He denied worsening chest pain today. He claims that it has been continuous since Friday. Initial EKG at [facility] emergency room showed minor inferior ST elevation. Troponin came back elevated at 26. Repeat EKG showed more pronounced ST elevation especially in the right-sided leads. He was transferred for emergent cardiac catheterization.   Medications:  Sedation:  3 mg IV Versed, 100 mcg IV Fentanyl, 2 mg of Dilaudid  Contrast:  270 mL Omnipaque  Diagnostic Procedure Details: The right groin was prepped, draped, and anesthetized with 1% lidocaine. Using the modified Seldinger technique, a 6 French sheath was introduced into the right femoral artery. There was significant difficulty in advancing the sheath due to scar tissue from previous graft surgery. I had to use a 5 Jamaica dilator and exchange the wire into a Rosen wire. Standard Judkins catheters were used for selective coronary angiography and left ventriculography. Catheter exchanges were performed over a wire.   During the diagnostic procedure, it became clear that the patient could not safely hold still for the procedure due to reported severe back and shoulder pain. According to him he has these chronic pain issues. We gave him escalated doses of sedation and still I felt that it was unsafe to continue as he almost fell off the table. Thus, I requested the assistance from anesthesia. The patient was given propofol  in order to safely finish the procedure.  Procedural Findings:  Hemodynamics: AO:  110/70   mmHg LV:  112/8    mmHg LVEDP: 12  mmHg  Coronary angiography: Coronary dominance: Likely codominant   Left Main:  Normal  Left Anterior Descending (LAD):  Large in size and mildly calcified. There is 95% tubular stenosis proximally. There is 50% stenosis in the midsegment. The rest of the vessel has minor irregularities.  1st diagonal (D1):  Small in size.  2nd diagonal (D2):  Small in size.  3rd diagonal (D3):  Small in size.  Circumflex (LCx):  Normal in size and mildly calcified. There is 80% ostial stenosis supplying mainly on the posterior lateral branch. The vessel courses in the AV groove. OM distribution is also applied by a large ramus branch.  Ramus Intermedius:  Large in size with 20% proximal disease. This gives 3 branches which have minor irregularities.  Right Coronary Artery: Normal in size. The vessel is mildly calcified. It is occluded in the mid segment with TIMI 0 flow. No collaterals are noted distally.   Left ventriculography: Left ventricular systolic function is normal , LVEF is estimated at 55 %, there is no significant mitral regurgitation . Moderate basal inferior wall hypokinesis.  PCI Procedure Note:  Following the diagnostic procedure, the decision was made to proceed with PCI. The sheath was upsized to a 6 Jamaica. Weight-based bivalirudin was given for anticoagulation. Once a therapeutic ACT was achieved, a 6 Jamaica JR 4 guide catheter was inserted.  I initially used an intuition wire which did not cross the occlusion. A run through wire was then used and also  was unsuccessful. This was followed by a whisper wire which did not cross. A Miracle Bro 3 wire was used and also did not cross the occlusion in spite of placing a wire and the RV branch at the site of occlusion. At this point, I uscrubbed and discussed the case with Dr. Lavinia SharpsBartel regarding emergent CABG given  the presence of three-vessel coronary artery disease. He felt that the patient would be too high risk and he probably completed his inferior infarct given that symptoms started on Friday. I thus decided to treat the high grade stenosis in the proximal LAD. An XB LAD 3.5 guiding catheter was used. The lesion was crossed with an intuition wire. The lesion was predilated with a 2.5 x 12 balloon.  The lesion was then stented with a 3.0 x 18 Xience drug-eluting stent.  The stent was postdilated with a 3.5 x 12 noncompliant balloon.  Following PCI, there was 0% residual stenosis and TIMI-3 flow. Final angiography confirmed an excellent result. The sheath was sutured in place to be removed manually.  The patient was transferred to the intensive care unit. His vitals were stable except for mild sinus tachycardia.  PCI Data: Vessel - mid RCA/Segment - mid Percent Stenosis (pre)  100% TIMI-flow 0 Unsuccessful PCI due to inability to cross the occlusion which might be chronic.  Vessel - LAD/Segment - proximal Percent Stenosis (pre)  95% TIMI-flow 3 Stent 3.0 x 18 mm Xience drug-eluting stent postdilated with a 3.5 noncompliant balloon Percent Stenosis (post) 0 TIMI-flow (post) 3  Final Conclusions:  1. Severe three-vessel coronary artery disease. Occluded mid RCA to the culprit for inferior ST elevation myocardial infarction. However, this is a late presentation of at least 48 hours. There is high grade proximal LAD stenosis and significant ostial left circumflex stenosis. 2. High normal left ventricular end-diastolic pressure of with normal LV systolic function with ejection fraction of 55% and moderate inferior wall hypokinesis 3. Unsuccessful RCA PCI due to inability to cross the occlusion in spite of using 4 different wires. 4. successful angioplasty and drug-eluting stent placement to the proximal LAD  Recommendations:  The patient came with late presentation inferior ST elevation myocardial  infarction of at least 48 hours of duration. The RCA occlusion could not be crossed. I discussed with cardiothoracic surgery regarding the option of proceeding with emergent CABG especially that she has three-vessel coronary artery disease. It was felt that he would be too high risk in the acute setting especially that he is a dialysis patient with risk of hyperkalemia and other metabolic abnormalities. Also the distal RCA was not visualized and thus it's hard to know if there is a graftable vessel in that area. Although there is significant ostial left circumflex stenosis, the vessel goes in the AV groove and likely is not graftable. all OM territory supplied by a large ramus artery which is free of obstructive disease. Due to all of the above, it was felt that it would be best to treat his inferior MI medically given that he presented more than 48 hours from onset of symptoms and treat the high grade maximal LAD stenosis percutaneously. This was a very difficult case as we could not sedate the patient to safely finish the procedure. It was done with the presence of anesthesia who administered propofol throughout the case.  Lorine BearsMuhammad Patrisia Faeth MD, [facility] 05/21/2013, 3:53 PM

## 2013-05-21 NOTE — Consult Note (Signed)
I was asked by Dr. Fletcher Anon to see this 58 year old man with ESRD on hemodialysis 4 days per week MTWF at the [facility] who presented with a couple of days of chest pain and subsequently presented to [facility] hospital via [facility] with an acute inferior ST elevation MI.  He underwent MAC anesthesia due to his opoid tolerance and PTA of the LAD and unsuccessful RCI PCI due to occlusion  Potassium was 5.53meq/l preproceduraly and post procedure 6.7 and 6.5 meq/l therefore we are asked to evaluate.  He has a functioning leg groin AVG and has a sheath in his right femoral artery.   Past Medical History  Diagnosis Date  . Anxiety   . Back pain   . GERD (gastroesophageal reflux disease)   . Peripheral neuropathy   . Arthritis   . CHF (congestive heart failure)   . Active smoker   . Dialysis patient     M-W-F  . Renal failure     MWF dialysis at [facility]  . Diabetes mellitus     diet controlled  . 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 PONV (postoperative nausea and vomiting)   . Hypertension   . Bell palsy   . Heart murmur   . Shortness of breath     with ambulation  . Carpal tunnel syndrome     bilateral  . Hemodialysis patient     M,W,F  . Dysrhythmia     Hx: of palpitataions  . Headache(784.0)     Hx: of Migraines   Past Surgical History  Procedure Laterality Date  . Total hip arthroplasty    . Thyroidectomy    . Tooth extraction    . Mandible fracture surgery    . Dg av dialysis graft declot or    . Insertion of dialysis catheter  03/28/2011    Procedure: INSERTION OF DIALYSIS CATHETER;  Surgeon: Mal Misty, MD;  Location: [place];  Service: Vascular;  Laterality: Left;  . Av fistula placement  03/31/2011    Procedure: INSERTION OF ARTERIOVENOUS (AV) GORE-TEX GRAFT THIGH;  Surgeon: Elam Dutch, MD;  Location: [facility] OR;  Service: Vascular;  Laterality: Right;  redo right thigh arteriovenous gortex graft using gore-tex stretch 75mm x 70cm  . Thrombectomy w/  embolectomy  06/02/2011    Procedure: THROMBECTOMY ARTERIOVENOUS GORE-TEX GRAFT;  Surgeon: Angelia Mould, MD;  Location: [place];  Service: Vascular;  Laterality: Right;  Thrombectomy right thigh arteriovenous gortex graft;  revision by  replacement of large portion of graft with 66mm gore-tex   . Insertion of dialysis catheter  08/28/2011    Procedure: INSERTION OF DIALYSIS CATHETER;  Surgeon: Conrad [place], MD;  Location: [place];  Service: Vascular;  Laterality: Left;  Atempted Bilateral Internal Jugular, Bilateral Subclavin insertion of 55cm Dialysis Catheter Left Femoral  . Insertion of dialysis catheter  12/15/2011    Procedure: INSERTION OF DIALYSIS CATHETER;  Surgeon: Elam Dutch, MD;  Location: [place];  Service: Vascular;  Laterality: Right;  Insertion of Right Femoral Dialysis Catheter  . Exchange of a dialysis catheter  02/26/2012    Procedure: EXCHANGE OF A DIALYSIS CATHETER;  Surgeon: Rosetta Posner, MD;  Location: [place];  Service: Vascular;  Laterality: Right;  . Joint replacement    . Exchange of a dialysis catheter  05/18/2012    Procedure: EXCHANGE OF A DIALYSIS CATHETER;  Surgeon: Rosetta Posner, MD;  Location:  [facility] OR;  Service: Vascular;  Laterality: Right;  right femoral dialysis catheter  . Av fistula placement  05/18/2012    Procedure: INSERTION OF ARTERIOVENOUS (AV) GORE-TEX GRAFT THIGH;  Surgeon: Rosetta Posner, MD;  Location: [facility] OR;  Service: Vascular;  Laterality: Left;  using 77mm by 70cm goretex graft  . Thrombectomy w/ embolectomy Left 08/25/2012    Procedure: THROMBECTOMY ARTERIOVENOUS GORE-TEX GRAFT;  Surgeon: Mal Misty, MD;  Location: [place];  Service: Vascular;  Laterality: Left;  Attempted thrombectomy of left thigh arteriovenous gortex graft.   . Av fistula placement Left 08/25/2012    Procedure: INSERTION OF ARTERIOVENOUS (AV) GORE-TEX GRAFT THIGH;  Surgeon: Mal Misty, MD;  Location: [facility] OR;  Service: Vascular;  Laterality: Left;  Using 28mm x 40cm vascular Gortex graft.   . Revision of arteriovenous goretex graft Left 12/14/2012    Procedure: Revision of Left Thigh Graft;  Surgeon: Rosetta Posner, MD;  Location: [place];  Service: Vascular;  Laterality: Left;  . Avgg removal Left 02/16/2013    Procedure: EXCISION OF LEFT ARM ARTERIOVENOUS GORETEX GRAFT TIMES 2 WITH VEIN PATCH ANGIOPLASTY OF BRACIAL ARTERY.  ;  Surgeon: Angelia Mould, MD;  Location: [place];  Service: Vascular;  Laterality: Left;  Converted from [place] to General.     Social History:  reports that he has been smoking Cigarettes.  He has a 40 pack-year smoking history. He has never used smokeless tobacco. He reports that he does not drink alcohol or use illicit drugs. Allergies: No Known Allergies Family History  Problem Relation Age of Onset  . Diabetes Mother   . Stroke Mother   . Heart disease Mother   . Kidney disease Father   . Hyperlipidemia Father     Medications:  Scheduled: . aspirin  81 mg Oral Daily  . atorvastatin  80 mg Oral q1800  . heparin  5,000 Units Subcutaneous Q8H  . metoprolol tartrate  25 mg Oral BID  . pantoprazole  40 mg Oral Daily  . sodium bicarbonate  650 mg Oral BID  . [START ON 05/22/2013] Ticagrelor  90 mg Oral BID   ROS: He has chronic pain and opoid tolerant; he dialyzes 4 days a week due to volume overload  Pulse 89, temperature 98.5 F (36.9 C), temperature source Oral, resp. rate 26, height $RemoveBe'5\' 11"'[id number]$  (1.803 m), weight 130 kg (286 lb 9.6 oz), SpO2 98.00%.  General appearance: alert and cooperative Head: Normocephalic, without obvious abnormality, atraumatic Eyes: negative Ears: normal TM's and external ear canals both ears Nose: Nares normal. Septum midline. Mucosa normal. No drainage or sinus tenderness. Throat: lips, mucosa, and tongue normal; teeth and gums normal Chest wall: no tenderness GI: soft, non-tender; bowel sounds normal; no masses,  no organomegaly Extremities: right groin AVG with thrill, left groin with arterial sheeth  Skin: Skin  color, texture, turgor normal. No rashes or lesions Neurologic: Grossly normal Results for orders placed during the hospital encounter of 05/21/13 (from the past 48 hour(s))  MRSA PCR SCREENING     Status: None   Collection Time    05/21/13  3:59 PM      Result Value Range   MRSA by PCR NEGATIVE  NEGATIVE   Comment:            The GeneXpert MRSA Assay (FDA     approved for NASAL specimens     only), is one component of a     comprehensive MRSA colonization  surveillance program. It is not     intended to diagnose MRSA     infection nor to guide or     monitor treatment for     MRSA infections.  GLUCOSE, CAPILLARY     Status: None   Collection Time    05/21/13  4:06 PM      Result Value Range   Glucose-Capillary 75  70 - 99 mg/dL  CBC     Status: Abnormal   Collection Time    05/21/13  4:49 PM      Result Value Range   WBC 16.1 (*) 4.0 - 10.5 K/uL   RBC 4.84  4.22 - 5.81 MIL/uL   Hemoglobin 14.9  13.0 - 17.0 g/dL   HCT 45.6  39.0 - 52.0 %   MCV 94.2  78.0 - 100.0 fL   MCH 30.8  26.0 - 34.0 pg   MCHC 32.7  30.0 - 36.0 g/dL   RDW 17.6 (*) 11.5 - 15.5 %   Platelets 165  150 - 400 K/uL  BASIC METABOLIC PANEL     Status: Abnormal   Collection Time    05/21/13  4:49 PM      Result Value Range   Sodium 129 (*) 137 - 147 mEq/L   Potassium 6.7 (*) 3.7 - 5.3 mEq/L   Comment: NO VISIBLE HEMOLYSIS     CRITICAL RESULT CALLED TO, READ BACK BY AND VERIFIED WITH:     P.SHELTON,RN 1747 05/21/13 M.CAMPBELL   Chloride 89 (*) 96 - 112 mEq/L   CO2 19  19 - 32 mEq/L   Glucose, Bld 74  70 - 99 mg/dL   BUN 36 (*) 6 - 23 mg/dL   Creatinine, Ser 11.01 (*) 0.50 - 1.35 mg/dL   Calcium 9.6  8.4 - 10.5 mg/dL   GFR calc non Af Amer 4 (*) >90 mL/min   GFR calc Af Amer 5 (*) >90 mL/min   Comment: (NOTE)     The eGFR has been calculated using the CKD EPI equation.     This calculation has not been validated in all clinical situations.     eGFR's persistently <90 mL/min signify possible  Chronic Kidney     Disease.  TROPONIN I     Status: Abnormal   Collection Time    05/21/13  4:49 PM      Result Value Range   Troponin I 8.71 (*) <0.30 ng/mL   Comment:            Due to the release kinetics of cTnI,     a negative result within the first hours     of the onset of symptoms does not rule out     myocardial infarction with certainty.     If myocardial infarction is still suspected,     repeat the test at appropriate intervals.     CRITICAL RESULT CALLED TO, READ BACK BY AND VERIFIED WITH:     P.SHELTON,RN 1753 05/21/13 M.CAMPBELL  BASIC METABOLIC PANEL     Status: Abnormal   Collection Time    05/21/13  5:52 PM      Result Value Range   Sodium 129 (*) 137 - 147 mEq/L   Potassium 6.5 (*) 3.7 - 5.3 mEq/L   Comment: CRITICAL RESULT CALLED TO, READ BACK BY AND VERIFIED WITH:     [facility] 2023 05/21/13 M.CAMPBELL   Chloride 90 (*) 96 - 112 mEq/L   CO2 19  19 - 32 mEq/L  Glucose, Bld 99  70 - 99 mg/dL   BUN 39 (*) 6 - 23 mg/dL   Creatinine, Ser 11.37 (*) 0.50 - 1.35 mg/dL   Calcium 9.2  8.4 - 10.5 mg/dL   GFR calc non Af Amer 4 (*) >90 mL/min   GFR calc Af Amer 5 (*) >90 mL/min   Comment: (NOTE)     The eGFR has been calculated using the CKD EPI equation.     This calculation has not been validated in all clinical situations.     eGFR's persistently <90 mL/min signify possible Chronic Kidney     Disease.  POCT ACTIVATED CLOTTING TIME     Status: None   Collection Time    05/21/13  5:55 PM      Result Value Range   Activated Clotting Time 227     EKG inferior MI, no hyperkalemia changes  Assessment:  1 ESRD with hyperkalemia of uncertain cause 2 Hyperkalemia 3 Acute Inferior wall ST elevation MI, s/p PTA 3 Hx of volume excess requiring 4 days per week dialysis Plan: 1 Urgent HD tonight for hyperkalemia 2 Check usual meds/labs/etc in AM from kidney center  [facility] 05/21/2013, 9:23 PM

## 2013-05-21 NOTE — H&P (Signed)
History and Physical  Patient ID: [name] MRN: [id number] DOB/AGE: Jul 21, 1955 58 y.o. Admit date: 05/21/2013  Primary Care Physician: Dyke Maes, MD Primary Cardiologist: new  HPI: This is a 58 year old African American male with no previous cardiac history. He has known history of end-stage renal disease on hemodialysis for at least 17 years, diet controlled diabetes, chronic anxiety, neuropathy with chronic pain, hypertension and hyperlipidemia. He started having substernal chest pain on Friday which has been continuous since then. He did not seek medical attention. He informed his mother today that he was having chest pain and she dirrected him to go to the emergency room. He went to the emergency room at [facility]. The patient's chest pain improved significantly with nitroglycerin. However, his blood pressure dropped from 170-105 systolic. Initial EKG showed borderline inferior ST elevation. His labs came back showing a troponin of 26. Right-sided EKG showed 1 mm elevation of the right-sided leads. Thus, he was transferred for emergent cardiac catheterization.  Review of systems complete and found to be negative unless listed above  Past Medical History  Diagnosis Date  . Anxiety   . Back pain   . GERD (gastroesophageal reflux disease)   . Peripheral neuropathy   . Arthritis   . CHF (congestive heart failure)   . Active smoker   . Dialysis patient     M-W-F  . Renal failure     MWF dialysis at [facility]  . Diabetes mellitus     diet controlled  . 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 PONV (postoperative nausea and vomiting)   . Hypertension   . Bell palsy   . Heart murmur   . Shortness of breath     with ambulation  . Carpal tunnel syndrome     bilateral  . Hemodialysis patient     M,W,F  . Dysrhythmia     Hx: of palpitataions  . Headache(784.0)     Hx: of Migraines    Family History  Problem Relation Age of Onset  . Diabetes  Mother   . Stroke Mother   . Heart disease Mother   . Kidney disease Father   . Hyperlipidemia Father     History   Social History  . Marital Status: Single    Spouse Name: N/A    Number of Children: N/A  . Years of Education: N/A   Occupational History  . Not on file.   Social History Main Topics  . Smoking status: Current Every Day Smoker -- 1.00 packs/day for 40 years    Types: Cigarettes  . Smokeless tobacco: Never Used     Comment: pt states that he wants to quit  . Alcohol Use: No  . Drug Use: No  . Sexual Activity: No   Other Topics Concern  . Not on file   Social History Narrative  . No narrative on file    Past Surgical History  Procedure Laterality Date  . Total hip arthroplasty    . Thyroidectomy    . Tooth extraction    . Mandible fracture surgery    . Dg av dialysis graft declot or    . Insertion of dialysis catheter  03/28/2011    Procedure: INSERTION OF DIALYSIS CATHETER;  Surgeon: Pryor Ochoa, MD;  Location: [facility] OR;  Service: Vascular;  Laterality: Left;  . Av fistula placement  03/31/2011    Procedure: INSERTION OF ARTERIOVENOUS (AV) GORE-TEX GRAFT  THIGH;  Surgeon: Sherren Kerns, MD;  Location: [facility] OR;  Service: Vascular;  Laterality: Right;  redo right thigh arteriovenous gortex graft using gore-tex stretch 6mm x 70cm  . Thrombectomy w/ embolectomy  06/02/2011    Procedure: THROMBECTOMY ARTERIOVENOUS GORE-TEX GRAFT;  Surgeon: Chuck Hint, MD;  Location: [facility] OR;  Service: Vascular;  Laterality: Right;  Thrombectomy right thigh arteriovenous gortex graft;  revision by  replacement of large portion of graft with 7mm gore-tex   . Insertion of dialysis catheter  08/28/2011    Procedure: INSERTION OF DIALYSIS CATHETER;  Surgeon: Fransisco Hertz, MD;  Location: [facility] OR;  Service: Vascular;  Laterality: Left;  Atempted Bilateral Internal Jugular, Bilateral Subclavin insertion of 55cm Dialysis Catheter Left Femoral  . Insertion of dialysis catheter  12/15/2011     Procedure: INSERTION OF DIALYSIS CATHETER;  Surgeon: Sherren Kerns, MD;  Location: [facility] OR;  Service: Vascular;  Laterality: Right;  Insertion of Right Femoral Dialysis Catheter  . Exchange of a dialysis catheter  02/26/2012    Procedure: EXCHANGE OF A DIALYSIS CATHETER;  Surgeon: Larina Earthly, MD;  Location: [facility] OR;  Service: Vascular;  Laterality: Right;  . Joint replacement    . Exchange of a dialysis catheter  05/18/2012    Procedure: EXCHANGE OF A DIALYSIS CATHETER;  Surgeon: Larina Earthly, MD;  Location: [facility] OR;  Service: Vascular;  Laterality: Right;  right femoral dialysis catheter  . Av fistula placement  05/18/2012    Procedure: INSERTION OF ARTERIOVENOUS (AV) GORE-TEX GRAFT THIGH;  Surgeon: Larina Earthly, MD;  Location: [facility] OR;  Service: Vascular;  Laterality: Left;  using 6mm by 70cm goretex graft  . Thrombectomy w/ embolectomy Left 08/25/2012    Procedure: THROMBECTOMY ARTERIOVENOUS GORE-TEX GRAFT;  Surgeon: Pryor Ochoa, MD;  Location: [facility] OR;  Service: Vascular;  Laterality: Left;  Attempted thrombectomy of left thigh arteriovenous gortex graft.   . Av fistula placement Left 08/25/2012    Procedure: INSERTION OF ARTERIOVENOUS (AV) GORE-TEX GRAFT THIGH;  Surgeon: Pryor Ochoa, MD;  Location: [facility] OR;  Service: Vascular;  Laterality: Left;  Using 6mm x 40cm vascular Gortex graft.  . Revision of arteriovenous goretex graft Left 12/14/2012    Procedure: Revision of Left Thigh Graft;  Surgeon: Larina Earthly, MD;  Location: [facility] OR;  Service: Vascular;  Laterality: Left;  . Avgg removal Left 02/16/2013    Procedure: EXCISION OF LEFT ARM ARTERIOVENOUS GORETEX GRAFT TIMES 2 WITH VEIN PATCH ANGIOPLASTY OF BRACIAL ARTERY.  ;  Surgeon: Chuck Hint, MD;  Location: [facility] OR;  Service: Vascular;  Laterality: Left;  Converted from MAC to General.       Prescriptions prior to admission  Medication Sig Dispense Refill  . amLODipine (NORVASC) 10 MG tablet Take 10 mg by mouth daily.      Marland Kitchen aspirin 325  MG EC tablet Take 325 mg by mouth daily.        Marland Kitchen b complex-vitamin c-folic acid (NEPHRO-VITE) 0.8 MG TABS Take 0.8 mg by mouth at bedtime.       . calcium acetate (PHOSLO) 667 MG capsule Take 2,668 mg by mouth 3 (three) times daily with meals.        . colchicine 0.6 MG tablet Take 0.6 mg by mouth 2 (two) times daily as needed. For gout      . cyclobenzaprine (FLEXERIL) 5 MG tablet Take 1 tablet (5 mg total) by mouth 2 (two) times daily as needed for muscle  spasms.  20 tablet  0  . diphenhydrAMINE (BENADRYL) 25 MG tablet Take 25 mg by mouth every 6 (six) hours as needed. For itching      . esomeprazole (NEXIUM) 40 MG capsule Take 40 mg by mouth every evening.       . gabapentin (NEURONTIN) 100 MG capsule Take 100 mg by mouth at bedtime as needed. For nerve pain      . sodium bicarbonate 650 MG tablet Take 650 mg by mouth 2 (two) times daily.          Physical Exam: Constitutional: He is oriented to person, place, and time. He appears well-developed and well-nourished.   HENT: No nasal discharge.  Head: Normocephalic and atraumatic.  Eyes: Pupils are equal and round.  No discharge. Neck: Normal range of motion. Neck supple. No JVD present. No thyromegaly present.  Cardiovascular: Normal rate, regular rhythm, normal heart sounds. Exam reveals no gallop and no friction rub. No murmur heard.  Pulmonary/Chest: Effort normal and breath sounds normal. No stridor. No respiratory distress. He has no wheezes. He has no rales. He exhibits no tenderness.  Abdominal: Soft. Bowel sounds are normal. He exhibits no distension. There is no tenderness. There is no rebound and no guarding.  Musculoskeletal: Normal range of motion. He exhibits mild edema and no tenderness.  Neurological: He is alert and oriented to person, place, and time.  Skin: Skin is warm and dry. No rash noted. He is not diaphoretic. No erythema. No pallor.  Psychiatric: He is alert and oriented x3 but continuously complaining of back and  shoulder pain.      Labs:   Lab Results  Component Value Date   WBC 10.1 12/14/2012   HGB 13.3 02/16/2013   HCT 39.0 02/16/2013   MCV 92.8 12/14/2012   PLT 159 12/14/2012   No results found for this basename: NA, K, CL, CO2, BUN, CREATININE, CALCIUM, LABALBU, PROT, BILITOT, ALKPHOS, ALT, AST, GLUCOSE,  in the last 168 hours No results found for this basename: CKTOTAL, CKMB, CKMBINDEX, TROPONINI    No results found for this basename: CHOL   No results found for this basename: HDL   No results found for this basename: LDLCALC   No results found for this basename: TRIG   No results found for this basename: CHOLHDL   No results found for this basename: LDLDIRECT      Radiology: No results found.  EKG: Normal sinus rhythm with 1-2 mm of inferior ST elevation with reciprocal ST depression in 1 and aVL  ASSESSMENT AND PLAN:  1. Acute inferior ST elevation myocardial infarction with late presentation of at least 48 hours: The patient with persistent chest pain and EKG changes. Thus, emergent cardiac catheterization is recommended. He was already given aspirin and heparin. Further recommendations to follow after cardiac catheterization.  2. End-stage renal disease on hemodialysis: There was mild hyperkalemia on initial labs at 5.4. He reports having dialysis done on Friday. Repeat basic metabolic profile after cardiac catheterization and consult nephrology for dialysis.  3. Hypertension: Start treatment with a beta blocker. An ACE inhibitor can be added if his ejection fraction is reduced.  4. Hyperlipidemia: Start high-dose statin due to acute myocardial infarction.  Signed: Lorine BearsMuhammad Arida MD, [facility]. Francis Medical CenterFACC 05/21/2013, 4:34 PM

## 2013-05-21 NOTE — Anesthesia Postprocedure Evaluation (Signed)
  Anesthesia Post-op Note  Patient: Loetta Rough  Procedure(s) Performed: Procedure(s): LEFT HEART CATH (Bilateral) PERCUTANEOUS CORONARY STENT INTERVENTION (PCI-S)  Patient Location: Cath Lab  Anesthesia Type:MAC  Level of Consciousness: awake  Airway and Oxygen Therapy: Patient Spontanous Breathing  Post-op Pain: mild  Post-op Assessment: Post-op Vital signs reviewed, Patient's Cardiovascular Status Stable, Respiratory Function Stable, Patent Airway, No signs of Nausea or vomiting and Pain level controlled  Post-op Vital Signs: Reviewed and stable  Complications: No apparent anesthesia complications

## 2013-05-21 NOTE — Anesthesia Preprocedure Evaluation (Addendum)
Anesthesia Evaluation  Patient identified by MRN, date of birth, ID band Patient awake    Reviewed: Allergy & Precautions, H&P , NPO status , Patient's Chart, lab work & pertinent test results  History of Anesthesia Complications (+) PONV  Airway Mallampati: II TM Distance: >3 FB Neck ROM: Limited    Dental   Pulmonary shortness of breath, COPDCurrent Smoker,  + rhonchi         Cardiovascular hypertension, + angina + CAD and +CHF Rhythm:Regular Rate:Tachycardia     Neuro/Psych  Headaches,  Neuromuscular disease    GI/Hepatic GERD-  ,(+) Hepatitis -  Endo/Other  diabetes  Renal/GU ESRF and DialysisRenal disease     Musculoskeletal   Abdominal (+) + obese,   Peds  Hematology   Anesthesia Other Findings Combative, uncooperative on cath table for intervention Difficult to assess airway due to pt cooperation  Reproductive/Obstetrics                          Anesthesia Physical Anesthesia Plan  ASA: IV and emergent  Anesthesia Plan: MAC   Post-op Pain Management:    Induction: Intravenous  Airway Management Planned: Simple Face Mask  Additional Equipment:   Intra-op Plan:   Post-operative Plan:   Informed Consent: I have reviewed the patients History and Physical, chart, labs and discussed the procedure including the risks, benefits and alternatives for the proposed anesthesia with the patient or authorized representative who has indicated his/her understanding and acceptance.     Plan Discussed with: CRNA and Surgeon  Anesthesia Plan Comments:         Anesthesia Quick Evaluation

## 2013-05-22 DIAGNOSIS — I2129 ST elevation (STEMI) myocardial infarction involving other sites: Secondary | ICD-10-CM

## 2013-05-22 DIAGNOSIS — Z9861 Coronary angioplasty status: Secondary | ICD-10-CM

## 2013-05-22 DIAGNOSIS — I517 Cardiomegaly: Secondary | ICD-10-CM

## 2013-05-22 DIAGNOSIS — Z955 Presence of coronary angioplasty implant and graft: Secondary | ICD-10-CM

## 2013-05-22 DIAGNOSIS — I219 Acute myocardial infarction, unspecified: Secondary | ICD-10-CM | POA: Diagnosis present

## 2013-05-22 DIAGNOSIS — I10 Essential (primary) hypertension: Secondary | ICD-10-CM

## 2013-05-22 LAB — POCT I-STAT, CHEM 8
BUN: 35 mg/dL — ABNORMAL HIGH (ref 6–23)
CREATININE: 11 mg/dL — AB (ref 0.50–1.35)
Calcium, Ion: 1.25 mmol/L — ABNORMAL HIGH (ref 1.12–1.23)
Chloride: 96 mEq/L (ref 96–112)
Glucose, Bld: 88 mg/dL (ref 70–99)
HEMATOCRIT: 48 % (ref 39.0–52.0)
Hemoglobin: 16.3 g/dL (ref 13.0–17.0)
Potassium: 6.5 mEq/L (ref 3.7–5.3)
SODIUM: 130 meq/L — AB (ref 137–147)
TCO2: 26 mmol/L (ref 0–100)

## 2013-05-22 LAB — POCT ACTIVATED CLOTTING TIME
ACTIVATED CLOTTING TIME: 188 s
ACTIVATED CLOTTING TIME: 138 s
Activated Clotting Time: 376 seconds
Activated Clotting Time: 199 seconds

## 2013-05-22 LAB — CBC
HEMATOCRIT: 45.6 % (ref 39.0–52.0)
Hemoglobin: 15.2 g/dL (ref 13.0–17.0)
MCH: 31.1 pg (ref 26.0–34.0)
MCHC: 33.3 g/dL (ref 30.0–36.0)
MCV: 93.4 fL (ref 78.0–100.0)
PLATELETS: 170 10*3/uL (ref 150–400)
RBC: 4.88 MIL/uL (ref 4.22–5.81)
RDW: 18 % — AB (ref 11.5–15.5)
WBC: 15.3 10*3/uL — ABNORMAL HIGH (ref 4.0–10.5)

## 2013-05-22 LAB — BASIC METABOLIC PANEL
BUN: 29 mg/dL — ABNORMAL HIGH (ref 6–23)
CHLORIDE: 93 meq/L — AB (ref 96–112)
CO2: 25 meq/L (ref 19–32)
CREATININE: 9.22 mg/dL — AB (ref 0.50–1.35)
Calcium: 9.3 mg/dL (ref 8.4–10.5)
GFR calc non Af Amer: 6 mL/min — ABNORMAL LOW (ref >90)
GFR, EST AFRICAN AMERICAN: 6 mL/min — AB (ref >90)
Glucose, Bld: 86 mg/dL (ref 70–99)
POTASSIUM: 4.9 meq/L (ref 3.7–5.3)
Sodium: 136 mEq/L — ABNORMAL LOW (ref 137–147)

## 2013-05-22 LAB — TROPONIN I
TROPONIN I: 9.89 ng/mL — AB (ref <0.30)
Troponin I: 12.1 ng/mL (ref <0.30)

## 2013-05-22 LAB — HEPATITIS B SURFACE ANTIGEN: Hepatitis B Surface Ag: NEGATIVE

## 2013-05-22 MED ORDER — NEPRO/CARBSTEADY PO LIQD
237.0000 mL | ORAL | Status: DC | PRN
  Filled 2013-05-22: qty 237

## 2013-05-22 MED ORDER — LIDOCAINE-PRILOCAINE 2.5-2.5 % EX CREA
1.0000 "application " | TOPICAL_CREAM | CUTANEOUS | Status: DC | PRN
  Filled 2013-05-22: qty 5

## 2013-05-22 MED ORDER — HEPARIN SODIUM (PORCINE) 1000 UNIT/ML DIALYSIS
1000.0000 [IU] | INTRAMUSCULAR | Status: DC | PRN
  Filled 2013-05-22: qty 1

## 2013-05-22 MED ORDER — OXYCODONE-ACETAMINOPHEN 5-325 MG PO TABS
1.0000 | ORAL_TABLET | Freq: Once | ORAL | Status: AC
  Administered 2013-05-22: 1 via ORAL
  Filled 2013-05-22: qty 1

## 2013-05-22 MED ORDER — PENTAFLUOROPROP-TETRAFLUOROETH EX AERO: 1.0000 "application " | INHALATION_SPRAY | CUTANEOUS | Status: DC | PRN

## 2013-05-22 MED ORDER — SODIUM CHLORIDE 0.9 % IV SOLN: 100.0000 mL | INTRAVENOUS | Status: DC | PRN

## 2013-05-22 MED ORDER — LIDOCAINE HCL (PF) 1 % IJ SOLN: 5.0000 mL | INTRAMUSCULAR | Status: DC | PRN

## 2013-05-22 MED ORDER — ALTEPLASE 2 MG IJ SOLR
2.0000 mg | Freq: Once | INTRAMUSCULAR | Status: AC | PRN
  Filled 2013-05-22: qty 2

## 2013-05-22 MED FILL — Sodium Chloride IV Soln 0.9%: INTRAVENOUS | Qty: 50 | Status: AC

## 2013-05-22 NOTE — Progress Notes (Signed)
  Echocardiogram 2D Echocardiogram has been performed.  [name] 05/22/2013, 10:16 AM

## 2013-05-22 NOTE — Progress Notes (Signed)
Right femoral arterial sheath pulled. Manual pressure held for 25 min. Hemostasis achieved. Vital signs stable (see flow sheet). Bil pedal pulses +1. Pressure dressing applied to right groin. No hematoma or bruising noted. Pt educated on 6 hour bedrest and site care. Pt voiced understanding and demonstration. Pt tolerated procedure well. Call bell in reach. Will continue to monitor.

## 2013-05-22 NOTE — Progress Notes (Signed)
Pt ran 2hr of a 3.5hr hemodialysis Tx. Removed 1263. Goal was 2l. Pt states that he had discussed with Dr. Lowell Guitar that his Tx would only be 2hrs and wants to sign off AMA if Dr. Lowell Guitar will not change the order. Dr. Lowell Guitar called and informed of this. Dr. Lowell Guitar states he did not tell the pt that his Tx would only be 2hrs. Pt tolerating his HD Tx well hemodynamically however he is tired and hungry and can not eat until his sheath is pulled. Signed off AMA due to being tired and hungry and ready for his sheath to be pulled. Pt given AMA sheet and signed it.

## 2013-05-22 NOTE — Care Management Note (Signed)
    Page 1 of 2   05/22/2013     12:31:57 PM   CARE MANAGEMENT NOTE 05/22/2013  Patient:  [name], [name]   Account Number:  [id number]  Date Initiated:  05/22/2013  Documentation initiated by:  Junius Creamer  Subjective/Objective Assessment:   adm w stemi     Action/Plan:   lives w fam, pcp dr Briant Cedar   Anticipated DC Date:     Anticipated DC Plan:        DC Planning Services  CM consult  Medication Assistance      PAC Choice  DURABLE MEDICAL EQUIPMENT  HOME HEALTH   Choice offered to / List presented to:  C-1 Patient   DME arranged  Levan Hurst      DME agency  [facility]     [facility] arranged  HH-1 RN  HH-2 PT  HH-4 NURSE'S AIDE      HH agency  [facility]   Status of service:   Medicare Important Message given?   (If response is "NO", the following Medicare IM given date fields will be blank) Date Medicare IM given:   Date Additional Medicare IM given:    Discharge Disposition:  HOME W HOME HEALTH SERVICES  Per UR Regulation:  Reviewed for med. necessity/level of care/duration of stay  If discussed at Long Length of Stay Meetings, dates discussed:    Comments:  1/26 1229p [place] Chiem rn,bsn pt given brilinta 30day free card. pt states medicaid for meds. placed medicaid prior approval form on shadow chart. brilinta on non preferred list w medicaid. went over list of [facility] agencies and no pref by pt or his sister. ref to [facility] w [facility] for [facility]-pt[facility]. pt also working on [employer] himself under [employer].

## 2013-05-22 NOTE — Progress Notes (Signed)
DAILY PROGRESS NOTE  Subjective:  Dizzy today, feels fatigued. Noted hypotension with exertion today. Echo performed had poor acoustic windows, but grossly normal LV function. I suspect he has a component of RV infarction - seeing how his RCA is occluded and could not be opened.  He denies any chest pain. EKG shows resolving inferior ST elevation <1 mm.  Objective:  Temp:  [97.2 F (36.2 C)-98.5 F (36.9 C)] 97.2 F (36.2 C) (01/26 0700) Pulse Rate:  [33-99] 80 (01/26 0955) Resp:  [14-31] 16 (01/26 0700) BP: (88-175)/(29-146) 106/54 mmHg (01/26 0700) SpO2:  [90 %-99 %] 92 % (01/26 0700) Arterial Line BP: (132-190)/(70-103) 144/75 mmHg (01/26 0200) Weight:  [278 lb 14.1 oz (126.5 kg)-286 lb 9.6 oz (130 kg)] 279 lb 1.6 oz (126.6 kg) (01/26 0136) Weight change:   Intake/Output from previous day: 01/25 0701 - 01/26 0700 In: 540 [P.O.:240; I.V.:300] Out: 1156   Intake/Output from this shift:    Medications: Current Facility-Administered Medications  Medication Dose Route Frequency Provider Last Rate Last Dose  . 0.9 %  sodium chloride infusion   Intravenous Continuous Wellington Hampshire, MD      . 0.9 %  sodium chloride infusion  100 mL Intravenous PRN Estanislado Emms, MD      . 0.9 %  sodium chloride infusion  100 mL Intravenous PRN Estanislado Emms, MD      . acetaminophen (TYLENOL) tablet 650 mg  650 mg Oral Q4H PRN Wellington Hampshire, MD   650 mg at 05/22/13 0956  . alteplase (CATHFLO ACTIVASE) injection 2 mg  2 mg Intracatheter Once PRN Estanislado Emms, MD      . aspirin chewable tablet 81 mg  81 mg Oral Daily Wellington Hampshire, MD   81 mg at 05/22/13 0956  . atorvastatin (LIPITOR) tablet 80 mg  80 mg Oral q1800 Wellington Hampshire, MD   80 mg at 05/21/13 1633  . feeding supplement (NEPRO CARB STEADY) liquid 237 mL  237 mL Oral PRN Estanislado Emms, MD      . gabapentin (NEURONTIN) capsule 100 mg  100 mg Oral QHS PRN Wellington Hampshire, MD   100 mg at 05/21/13 2238  . heparin injection  1,000 Units  1,000 Units Dialysis PRN Estanislado Emms, MD      . heparin injection 5,000 Units  5,000 Units Subcutaneous Q8H Wellington Hampshire, MD   5,000 Units at 05/22/13 0600  . labetalol (NORMODYNE,TRANDATE) injection 10 mg  10 mg Intravenous Q4H PRN Wellington Hampshire, MD      . lidocaine (PF) (XYLOCAINE) 1 % injection 5 mL  5 mL Intradermal PRN Estanislado Emms, MD      . lidocaine-prilocaine (EMLA) cream 1 application  1 application Topical PRN Estanislado Emms, MD      . metoprolol tartrate (LOPRESSOR) tablet 25 mg  25 mg Oral BID Wellington Hampshire, MD   25 mg at 05/22/13 0955  . morphine 4 MG/ML injection 4 mg  4 mg Intravenous Q1H PRN Wellington Hampshire, MD   4 mg at 05/22/13 0418  . ondansetron (ZOFRAN) injection 4 mg  4 mg Intravenous Q6H PRN Wellington Hampshire, MD      . pantoprazole (PROTONIX) EC tablet 40 mg  40 mg Oral Daily Wellington Hampshire, MD   40 mg at 05/22/13 0956  . pentafluoroprop-tetrafluoroeth (GEBAUERS) aerosol 1 application  1 application Topical PRN Estanislado Emms, MD      .  sodium bicarbonate tablet 650 mg  650 mg Oral BID Wellington Hampshire, MD   650 mg at 05/22/13 0955  . Ticagrelor (BRILINTA) tablet 90 mg  90 mg Oral BID Wellington Hampshire, MD   90 mg at 05/22/13 6301    Physical Exam: General appearance: alert, fatigued and no distress Neck: no carotid bruit and no JVD Lungs: clear to auscultation bilaterally Heart: regular rate and rhythm, S1, S2 normal, no murmur, click, rub or gallop Abdomen: soft, non-tender; bowel sounds normal; no masses,  no organomegaly and morbidly obese Extremities: extremities normal, atraumatic, no cyanosis or edema Pulses: 2+ and symmetric Skin: Skin color, texture, turgor normal. No rashes or lesions Neurologic: Grossly normal Psych: Pleasant, a little difficult to understand  Lab Results: Results for orders placed during the hospital encounter of 05/21/13 (from the past 48 hour(s))  MRSA PCR SCREENING     Status: None   Collection Time     05/21/13  3:59 PM      Result Value Range   MRSA by PCR NEGATIVE  NEGATIVE   Comment:            The GeneXpert MRSA Assay (FDA     approved for NASAL specimens     only), is one component of a     comprehensive MRSA colonization     surveillance program. It is not     intended to diagnose MRSA     infection nor to guide or     monitor treatment for     MRSA infections.  GLUCOSE, CAPILLARY     Status: None   Collection Time    05/21/13  4:06 PM      Result Value Range   Glucose-Capillary 75  70 - 99 mg/dL  CBC     Status: Abnormal   Collection Time    05/21/13  4:49 PM      Result Value Range   WBC 16.1 (*) 4.0 - 10.5 K/uL   RBC 4.84  4.22 - 5.81 MIL/uL   Hemoglobin 14.9  13.0 - 17.0 g/dL   HCT 45.6  39.0 - 52.0 %   MCV 94.2  78.0 - 100.0 fL   MCH 30.8  26.0 - 34.0 pg   MCHC 32.7  30.0 - 36.0 g/dL   RDW 17.6 (*) 11.5 - 15.5 %   Platelets 165  150 - 400 K/uL  BASIC METABOLIC PANEL     Status: Abnormal   Collection Time    05/21/13  4:49 PM      Result Value Range   Sodium 129 (*) 137 - 147 mEq/L   Potassium 6.7 (*) 3.7 - 5.3 mEq/L   Comment: NO VISIBLE HEMOLYSIS     CRITICAL RESULT CALLED TO, READ BACK BY AND VERIFIED WITH:     P.SHELTON,RN 1747 05/21/13 M.CAMPBELL   Chloride 89 (*) 96 - 112 mEq/L   CO2 19  19 - 32 mEq/L   Glucose, Bld 74  70 - 99 mg/dL   BUN 36 (*) 6 - 23 mg/dL   Creatinine, Ser 11.01 (*) 0.50 - 1.35 mg/dL   Calcium 9.6  8.4 - 10.5 mg/dL   GFR calc non Af Amer 4 (*) >90 mL/min   GFR calc Af Amer 5 (*) >90 mL/min   Comment: (NOTE)     The eGFR has been calculated using the CKD EPI equation.     This calculation has not been validated in all clinical situations.  eGFR's persistently <90 mL/min signify possible Chronic Kidney     Disease.  TROPONIN I     Status: Abnormal   Collection Time    05/21/13  4:49 PM      Result Value Range   Troponin I 8.71 (*) <0.30 ng/mL   Comment:            Due to the release kinetics of cTnI,     a negative  result within the first hours     of the onset of symptoms does not rule out     myocardial infarction with certainty.     If myocardial infarction is still suspected,     repeat the test at appropriate intervals.     CRITICAL RESULT CALLED TO, READ BACK BY AND VERIFIED WITH:     P.SHELTON,RN 1753 05/21/13 M.CAMPBELL  BASIC METABOLIC PANEL     Status: Abnormal   Collection Time    05/21/13  5:52 PM      Result Value Range   Sodium 129 (*) 137 - 147 mEq/L   Potassium 6.5 (*) 3.7 - 5.3 mEq/L   Comment: CRITICAL RESULT CALLED TO, READ BACK BY AND VERIFIED WITH:     [facility] 2023 05/21/13 M.CAMPBELL   Chloride 90 (*) 96 - 112 mEq/L   CO2 19  19 - 32 mEq/L   Glucose, Bld 99  70 - 99 mg/dL   BUN 39 (*) 6 - 23 mg/dL   Creatinine, Ser 11.37 (*) 0.50 - 1.35 mg/dL   Calcium 9.2  8.4 - 10.5 mg/dL   GFR calc non Af Amer 4 (*) >90 mL/min   GFR calc Af Amer 5 (*) >90 mL/min   Comment: (NOTE)     The eGFR has been calculated using the CKD EPI equation.     This calculation has not been validated in all clinical situations.     eGFR's persistently <90 mL/min signify possible Chronic Kidney     Disease.  POCT ACTIVATED CLOTTING TIME     Status: None   Collection Time    05/21/13  5:55 PM      Result Value Range   Activated Clotting Time 227    POCT ACTIVATED CLOTTING TIME     Status: None   Collection Time    05/21/13  8:23 PM      Result Value Range   Activated Clotting Time 199    POCT ACTIVATED CLOTTING TIME     Status: None   Collection Time    05/21/13  9:37 PM      Result Value Range   Activated Clotting Time 188    HEPATITIS B SURFACE ANTIGEN     Status: None   Collection Time    05/22/13 12:42 AM      Result Value Range   Hepatitis B Surface Ag NEGATIVE  NEGATIVE   Comment: Performed at [name]     Status: None   Collection Time    05/22/13  1:59 AM      Result Value Range   Activated Clotting Time 138    CBC     Status:  Abnormal   Collection Time    05/22/13  3:09 AM      Result Value Range   WBC 15.3 (*) 4.0 - 10.5 K/uL   Comment: WHITE COUNT CONFIRMED ON SMEAR   RBC 4.88  4.22 - 5.81 MIL/uL   Hemoglobin 15.2  13.0 - 17.0 g/dL  HCT 45.6  39.0 - 52.0 %   MCV 93.4  78.0 - 100.0 fL   MCH 31.1  26.0 - 34.0 pg   MCHC 33.3  30.0 - 36.0 g/dL   RDW 18.0 (*) 11.5 - 15.5 %   Platelets 170  150 - 400 K/uL  BASIC METABOLIC PANEL     Status: Abnormal   Collection Time    05/22/13  6:16 AM      Result Value Range   Sodium 136 (*) 137 - 147 mEq/L   Potassium 4.9  3.7 - 5.3 mEq/L   Chloride 93 (*) 96 - 112 mEq/L   CO2 25  19 - 32 mEq/L   Glucose, Bld 86  70 - 99 mg/dL   BUN 29 (*) 6 - 23 mg/dL   Creatinine, Ser 9.22 (*) 0.50 - 1.35 mg/dL   Calcium 9.3  8.4 - 10.5 mg/dL   GFR calc non Af Amer 6 (*) >90 mL/min   GFR calc Af Amer 6 (*) >90 mL/min   Comment: (NOTE)     The eGFR has been calculated using the CKD EPI equation.     This calculation has not been validated in all clinical situations.     eGFR's persistently <90 mL/min signify possible Chronic Kidney     Disease.    Imaging: No results found.  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 Chest pain has resolved. Now treating inferior/posterior MI medically. S/p DES to the proximal LAD. He is acting like an RV infarct, became hypotensive today with ambulation. I would not recommend removing excess fluid at dialysis until his RV recovers and BP improves. If he has persistent hypotension, he may need to get IV fluid back. On echo, the RV is not dilated, however, was not really well-visualized. EF seems to be grossly preserved. Will check AM lipid profile and cycle troponin to determine the extent of  MI.  Time Spent Directly with Patient:  20 minutes  Length of Stay:  LOS: 1 day   Pixie Casino, MD, [facility] Attending Cardiologist [facility]  HILTY,Kenneth C 05/22/2013, 11:02 AM

## 2013-05-22 NOTE — Progress Notes (Signed)
CARDIAC REHAB PHASE I   PRE:  Rate/Rhythm: 85 SR    BP: sitting 109/25    SaO2: 93 RA  MODE:  Ambulation: 150 ft   POST:  Rate/Rhythm: 94 SR    BP: sitting 1st BP would not register, 2nd 71/34 forearm, 3rd 78/49 upper arm, bed 97/62 upper arm     SaO2: 90 RA after several minutes  Pt had been up to BR earlier. Ambulated with RW, assist x1. At 120 ft pt stated his knees felt very weak but that he would be able to make it to recliner, which he did. BP wouldn't register initial attempt then 71/34. Thought to be inaccurate initially due to poor vascularization. Pt sts he feels sleepy and weak. Pt struggles to communicate sx appropriately. Attempted to discuss MI but his eyes were closing. Took BP again and still low. Put pt back to bed. BP better. RN applied O2. Will f/u tomorrow, pt now sleeping.  [id number] [name] [place] CES, ACSM 05/22/2013 10:24 AM

## 2013-05-22 NOTE — Progress Notes (Signed)
[name] has been enrolled in the Med-to-Bed Pilot Program for STEMI patients.  This program is designed to help patients transition out of the hospital by ensuring that patients have the opportunity to obtain medications and education on each medication.  The medications will be processed through the patient's insurance and delivered to the bedside prior to discharge.    The medications included are P2Y12 inhibitors, statins, beta-blockers, and aspirin.    When planning for discharge, please send additional prescriptions for the above medications to the [facility] (will be added to the patient's pharmacy list).    Please feel free to page me with questions or concerns!  Thank you, Piedad Climes, PharmD Clinical Pharmacist - Resident Pager: [phone] Pharmacy: [phone] 05/22/2013 4:26 PM

## 2013-05-22 NOTE — Progress Notes (Signed)
  [facility] Progress Note   Subjective: no complaints, had 2hr HD yest with 1 kg off  Filed Vitals:   05/22/13 1012 05/22/13 1016 05/22/13 1100 05/22/13 1157  BP: 78/49 97/62 98/63  98/63  Pulse:   81 81  Temp:    97 F (36.1 C)  TempSrc:    Oral  Resp: 17 20 19 20   Height:      Weight:      SpO2:   97% 93%  Alert, no distress Chest is clear bilat, no rales or wheezes GI: soft, non-tender; bowel sounds normal; no masses, no organomegaly  Extremities: right groin AVG with thrill, left groin with arterial sheeth  Skin: Skin color, texture, turgor normal. No rashes or lesions  Neurologic: Grossly normal L thigh AVG patent  Dialysis: [facility] M-Tu-W-F, 3.5 hours, 122kg, 3.5Ca bath, Hep 11000  EPO none, Hect 2ug, L thigh AVG  Problems: 1. Acute MI, s/p stent LAD, occluded RCA with inf MI 2. HyperK- resolved 3. ESRD- HD tomorrow and Wed, don't pull fluid per cardiology due to prob RV infarct 4. 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 aspirin  81 mg Oral Daily  . atorvastatin  80 mg Oral q1800  . heparin  5,000 Units Subcutaneous Q8H  . metoprolol tartrate  25 mg Oral BID  . pantoprazole  40 mg Oral Daily  . sodium bicarbonate  650 mg Oral BID  . Ticagrelor  90 mg Oral BID   . sodium chloride     sodium chloride, sodium chloride, acetaminophen, alteplase, feeding supplement (NEPRO CARB STEADY),  gabapentin, heparin, labetalol, lidocaine (PF), lidocaine-prilocaine, morphine injection, ondansetron (ZOFRAN) IV, pentafluoroprop-tetrafluoroeth

## 2013-05-23 ENCOUNTER — Encounter (HOSPITAL_COMMUNITY): Payer: Self-pay | Admitting: Neurology

## 2013-05-23 ENCOUNTER — Inpatient Hospital Stay (HOSPITAL_COMMUNITY): Payer: Medicare Other

## 2013-05-23 LAB — BASIC METABOLIC PANEL
BUN: 43 mg/dL — AB (ref 6–23)
CALCIUM: 9.5 mg/dL (ref 8.4–10.5)
CHLORIDE: 87 meq/L — AB (ref 96–112)
CO2: 22 mEq/L (ref 19–32)
CREATININE: 11.12 mg/dL — AB (ref 0.50–1.35)
GFR calc non Af Amer: 4 mL/min — ABNORMAL LOW (ref >90)
GFR, EST AFRICAN AMERICAN: 5 mL/min — AB (ref >90)
Glucose, Bld: 70 mg/dL (ref 70–99)
Potassium: 5.5 mEq/L — ABNORMAL HIGH (ref 3.7–5.3)
Sodium: 130 mEq/L — ABNORMAL LOW (ref 137–147)

## 2013-05-23 LAB — LIPID PANEL
CHOL/HDL RATIO: 6.1 ratio
CHOLESTEROL: 121 mg/dL (ref 0–200)
HDL: 20 mg/dL — AB (ref >39)
LDL CALC: 69 mg/dL (ref 0–99)
TRIGLYCERIDES: 162 mg/dL — AB (ref <150)
VLDL: 32 mg/dL (ref 0–40)

## 2013-05-23 LAB — CBC
HEMATOCRIT: 41.7 % (ref 39.0–52.0)
Hemoglobin: 13.4 g/dL (ref 13.0–17.0)
MCH: 30.2 pg (ref 26.0–34.0)
MCHC: 32.1 g/dL (ref 30.0–36.0)
MCV: 94.1 fL (ref 78.0–100.0)
PLATELETS: 198 10*3/uL (ref 150–400)
RBC: 4.43 MIL/uL (ref 4.22–5.81)
RDW: 17.6 % — AB (ref 11.5–15.5)
WBC: 13 10*3/uL — ABNORMAL HIGH (ref 4.0–10.5)

## 2013-05-23 LAB — GLUCOSE, CAPILLARY
GLUCOSE-CAPILLARY: 91 mg/dL (ref 70–99)
Glucose-Capillary: 66 mg/dL — ABNORMAL LOW (ref 70–99)
Glucose-Capillary: 101 mg/dL — ABNORMAL HIGH (ref 70–99)

## 2013-05-23 MED ORDER — NEPRO/CARBSTEADY PO LIQD: 237.0000 mL | ORAL | Status: DC | PRN

## 2013-05-23 MED ORDER — LIDOCAINE-PRILOCAINE 2.5-2.5 % EX CREA: 1.0000 "application " | TOPICAL_CREAM | CUTANEOUS | Status: DC | PRN

## 2013-05-23 MED ORDER — SODIUM CHLORIDE 0.9 % IV SOLN: 100.0000 mL | INTRAVENOUS | Status: DC | PRN

## 2013-05-23 MED ORDER — LIDOCAINE HCL (PF) 1 % IJ SOLN: 5.0000 mL | INTRAMUSCULAR | Status: DC | PRN

## 2013-05-23 MED ORDER — ALTEPLASE 2 MG IJ SOLR
2.0000 mg | Freq: Once | INTRAMUSCULAR | Status: DC | PRN
  Filled 2013-05-23: qty 2

## 2013-05-23 MED ORDER — HEPARIN SODIUM (PORCINE) 1000 UNIT/ML DIALYSIS
5000.0000 [IU] | INTRAMUSCULAR | Status: DC | PRN
  Filled 2013-05-23: qty 5

## 2013-05-23 MED ORDER — PENTAFLUOROPROP-TETRAFLUOROETH EX AERO: 1.0000 "application " | INHALATION_SPRAY | CUTANEOUS | Status: DC | PRN

## 2013-05-23 MED ORDER — HEPARIN SODIUM (PORCINE) 1000 UNIT/ML DIALYSIS: 1000.0000 [IU] | INTRAMUSCULAR | Status: DC | PRN

## 2013-05-23 MED ORDER — ALTEPLASE 2 MG IJ SOLR: 2.0000 mg | Freq: Once | INTRAMUSCULAR | Status: DC | PRN

## 2013-05-23 MED ORDER — HYDROCODONE-ACETAMINOPHEN 5-325 MG PO TABS
1.0000 | ORAL_TABLET | Freq: Four times a day (QID) | ORAL | Status: DC | PRN
  Administered 2013-05-23 – 2013-05-24 (×3): 1 via ORAL
  Filled 2013-05-23 (×3): qty 1

## 2013-05-23 MED ORDER — HEPARIN SODIUM (PORCINE) 1000 UNIT/ML DIALYSIS: 5000.0000 [IU] | INTRAMUSCULAR | Status: DC | PRN

## 2013-05-23 MED ORDER — HEPARIN SODIUM (PORCINE) 1000 UNIT/ML DIALYSIS: 4000.0000 [IU] | INTRAMUSCULAR | Status: DC | PRN

## 2013-05-23 NOTE — Progress Notes (Signed)
  [facility] Progress Note   Subjective: was hallucinating on IV MSO4 so that has been stopped. Tired, up in chair, not having any SOB or orthopnea, no cough  Filed Vitals:   05/23/13 0320 05/23/13 0550 05/23/13 0908 05/23/13 0922  BP: 83/53 93/40 117/47 97/65  Pulse: 78 77  80  Temp: 97.6 F (36.4 C)  97.3 F (36.3 C)   TempSrc: Oral  Oral   Resp: 13 13 19    Height:      Weight:      SpO2: 100% 95% 95%   Alert, no distress Bibasilar coarse rales 1/4 up No jvd RRR no MRG Abd obese, nt/nd, no ascites No LE edema, old accesses bilat UE's Neuro is nf, ox3 L thigh AVG patent  Dialysis: [name] M-Tu-W-F 3.5h   122kg   2K/3.5Ca Bath  Hep 11000  L thigh AVG  EPO none   Hect 2ug     Problems: 1. Acute MI, s/p stent LAD, occluded RCA with inf MI 2. Hypotension- suspected due to RV infarct 3. ESRD- HD today and tomorrow am, no UF today due to #2 4. HTN- holding norvasc, BP's 90's. 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 aspirin  81 mg Oral Daily  . atorvastatin  80 mg Oral q1800  . heparin  5,000 Units Subcutaneous Q8H  . metoprolol tartrate  25 mg Oral BID  . pantoprazole  40 mg Oral Daily  . sodium bicarbonate  650 mg Oral BID  . Ticagrelor  90 mg Oral BID   . sodium chloride     sodium chloride, sodium chloride, feeding supplement (NEPRO  CARB STEADY), gabapentin, heparin, HYDROcodone-acetaminophen, labetalol, lidocaine (PF), lidocaine-prilocaine, ondansetron (ZOFRAN) IV, pentafluoroprop-tetrafluoroeth

## 2013-05-23 NOTE — H&P (Signed)
Report was called to RN on unit 6 [place]. Pt will transfer after HD to 6 East room 8

## 2013-05-23 NOTE — Progress Notes (Signed)
DAILY PROGRESS NOTE  Subjective:  No events overnight. No chest pain. Was eating [employer] yesterday. We had a talk today about better dietary habits. He is for HD today - I have asked that extra fluid is not pulled off due to RV infarct physiology.  Cholesterol overnight shows TC 121, TG 162, HDL 20, LDL 69.  Objective:  Temp:  [97 F (36.1 C)-98.2 F (36.8 C)] 97.3 F (36.3 C) (01/27 0908) Pulse Rate:  [46-137] 80 (01/27 0922) Resp:  [12-23] 19 (01/27 0908) BP: (83-117)/(40-87) 97/65 mmHg (01/27 0922) SpO2:  [90 %-100 %] 95 % (01/27 0908) Weight change:   Intake/Output from previous day: 01/26 0701 - 01/27 0700 In: 702 [P.O.:702] Out: -   Intake/Output from this shift:    Medications: Current Facility-Administered Medications  Medication Dose Route Frequency Provider Last Rate Last Dose  . 0.9 %  sodium chloride infusion   Intravenous Continuous Wellington Hampshire, MD      . 0.9 %  sodium chloride infusion  100 mL Intravenous PRN Estanislado Emms, MD      . 0.9 %  sodium chloride infusion  100 mL Intravenous PRN Estanislado Emms, MD      . acetaminophen (TYLENOL) tablet 650 mg  650 mg Oral Q4H PRN Wellington Hampshire, MD   650 mg at 05/23/13 0240  . aspirin chewable tablet 81 mg  81 mg Oral Daily Wellington Hampshire, MD   81 mg at 05/23/13 9735  . atorvastatin (LIPITOR) tablet 80 mg  80 mg Oral q1800 Wellington Hampshire, MD   80 mg at 05/22/13 1728  . feeding supplement (NEPRO CARB STEADY) liquid 237 mL  237 mL Oral PRN Estanislado Emms, MD      . gabapentin (NEURONTIN) capsule 100 mg  100 mg Oral QHS PRN Wellington Hampshire, MD   100 mg at 05/21/13 2238  . heparin injection 1,000 Units  1,000 Units Dialysis PRN Estanislado Emms, MD      . heparin injection 5,000 Units  5,000 Units Subcutaneous Q8H Wellington Hampshire, MD   5,000 Units at 05/23/13 [phone]  . labetalol (NORMODYNE,TRANDATE) injection 10 mg  10 mg Intravenous Q4H PRN Wellington Hampshire, MD      . lidocaine (PF) (XYLOCAINE) 1 %  injection 5 mL  5 mL Intradermal PRN Estanislado Emms, MD      . lidocaine-prilocaine (EMLA) cream 1 application  1 application Topical PRN Estanislado Emms, MD      . metoprolol tartrate (LOPRESSOR) tablet 25 mg  25 mg Oral BID Wellington Hampshire, MD   25 mg at 05/23/13 2426  . morphine 4 MG/ML injection 4 mg  4 mg Intravenous Q1H PRN Wellington Hampshire, MD   4 mg at 05/23/13 0308  . ondansetron (ZOFRAN) injection 4 mg  4 mg Intravenous Q6H PRN Wellington Hampshire, MD      . pantoprazole (PROTONIX) EC tablet 40 mg  40 mg Oral Daily Wellington Hampshire, MD   40 mg at 05/23/13 8341  . pentafluoroprop-tetrafluoroeth (GEBAUERS) aerosol 1 application  1 application Topical PRN Estanislado Emms, MD      . sodium bicarbonate tablet 650 mg  650 mg Oral BID Wellington Hampshire, MD   650 mg at 05/23/13 9622  . Ticagrelor (BRILINTA) tablet 90 mg  90 mg Oral BID Wellington Hampshire, MD   90 mg at 05/23/13 2979    Physical Exam: General appearance: alert,  fatigued and no distress Neck: no carotid bruit and no JVD Lungs: clear to auscultation bilaterally Heart: regular rate and rhythm, S1, S2 normal, no murmur, click, rub or gallop Abdomen: soft, non-tender; bowel sounds normal; no masses,  no organomegaly and morbidly obese Extremities: extremities normal, atraumatic, no cyanosis or edema Pulses: 2+ and symmetric Skin: Skin color, texture, turgor normal. No rashes or lesions Neurologic: Grossly normal Psych: Pleasant, a little difficult to understand  Lab Results: Results for orders placed during the hospital encounter of 05/21/13 (from the past 48 hour(s))  POCT ACTIVATED CLOTTING TIME     Status: None   Collection Time    05/21/13  2:43 PM      Result Value Range   Activated Clotting Time 376    POCT I-STAT, CHEM 8     Status: Abnormal   Collection Time    05/21/13  3:46 PM      Result Value Range   Sodium 130 (*) 137 - 147 mEq/L   Potassium 6.5 (*) 3.7 - 5.3 mEq/L   Chloride 96  96 - 112 mEq/L   BUN 35 (*) 6 -  23 mg/dL   Creatinine, Ser 11.00 (*) 0.50 - 1.35 mg/dL   Glucose, Bld 88  70 - 99 mg/dL   Calcium, Ion 1.25 (*) 1.12 - 1.23 mmol/L   TCO2 26  0 - 100 mmol/L   Hemoglobin 16.3  13.0 - 17.0 g/dL   HCT 48.0  39.0 - 52.0 %  MRSA PCR SCREENING     Status: None   Collection Time    05/21/13  3:59 PM      Result Value Range   MRSA by PCR NEGATIVE  NEGATIVE   Comment:            The GeneXpert MRSA Assay (FDA     approved for NASAL specimens     only), is one component of a     comprehensive MRSA colonization     surveillance program. It is not     intended to diagnose MRSA     infection nor to guide or     monitor treatment for     MRSA infections.  GLUCOSE, CAPILLARY     Status: None   Collection Time    05/21/13  4:06 PM      Result Value Range   Glucose-Capillary 75  70 - 99 mg/dL  CBC     Status: Abnormal   Collection Time    05/21/13  4:49 PM      Result Value Range   WBC 16.1 (*) 4.0 - 10.5 K/uL   RBC 4.84  4.22 - 5.81 MIL/uL   Hemoglobin 14.9  13.0 - 17.0 g/dL   HCT 45.6  39.0 - 52.0 %   MCV 94.2  78.0 - 100.0 fL   MCH 30.8  26.0 - 34.0 pg   MCHC 32.7  30.0 - 36.0 g/dL   RDW 17.6 (*) 11.5 - 15.5 %   Platelets 165  150 - 400 K/uL  BASIC METABOLIC PANEL     Status: Abnormal   Collection Time    05/21/13  4:49 PM      Result Value Range   Sodium 129 (*) 137 - 147 mEq/L   Potassium 6.7 (*) 3.7 - 5.3 mEq/L   Comment: NO VISIBLE HEMOLYSIS     CRITICAL RESULT CALLED TO, READ BACK BY AND VERIFIED WITH:     P.SHELTON,RN 1747 05/21/13 M.CAMPBELL   Chloride 89 (*)  96 - 112 mEq/L   CO2 19  19 - 32 mEq/L   Glucose, Bld 74  70 - 99 mg/dL   BUN 36 (*) 6 - 23 mg/dL   Creatinine, Ser 11.01 (*) 0.50 - 1.35 mg/dL   Calcium 9.6  8.4 - 10.5 mg/dL   GFR calc non Af Amer 4 (*) >90 mL/min   GFR calc Af Amer 5 (*) >90 mL/min   Comment: (NOTE)     The eGFR has been calculated using the CKD EPI equation.     This calculation has not been validated in all clinical situations.      eGFR's persistently <90 mL/min signify possible Chronic Kidney     Disease.  TROPONIN I     Status: Abnormal   Collection Time    05/21/13  4:49 PM      Result Value Range   Troponin I 8.71 (*) <0.30 ng/mL   Comment:            Due to the release kinetics of cTnI,     a negative result within the first hours     of the onset of symptoms does not rule out     myocardial infarction with certainty.     If myocardial infarction is still suspected,     repeat the test at appropriate intervals.     CRITICAL RESULT CALLED TO, READ BACK BY AND VERIFIED WITH:     P.SHELTON,RN 1753 05/21/13 M.CAMPBELL  BASIC METABOLIC PANEL     Status: Abnormal   Collection Time    05/21/13  5:52 PM      Result Value Range   Sodium 129 (*) 137 - 147 mEq/L   Potassium 6.5 (*) 3.7 - 5.3 mEq/L   Comment: CRITICAL RESULT CALLED TO, READ BACK BY AND VERIFIED WITH:     [facility] 2023 05/21/13 M.CAMPBELL   Chloride 90 (*) 96 - 112 mEq/L   CO2 19  19 - 32 mEq/L   Glucose, Bld 99  70 - 99 mg/dL   BUN 39 (*) 6 - 23 mg/dL   Creatinine, Ser 11.37 (*) 0.50 - 1.35 mg/dL   Calcium 9.2  8.4 - 10.5 mg/dL   GFR calc non Af Amer 4 (*) >90 mL/min   GFR calc Af Amer 5 (*) >90 mL/min   Comment: (NOTE)     The eGFR has been calculated using the CKD EPI equation.     This calculation has not been validated in all clinical situations.     eGFR's persistently <90 mL/min signify possible Chronic Kidney     Disease.  POCT ACTIVATED CLOTTING TIME     Status: None   Collection Time    05/21/13  5:55 PM      Result Value Range   Activated Clotting Time 227    POCT ACTIVATED CLOTTING TIME     Status: None   Collection Time    05/21/13  8:23 PM      Result Value Range   Activated Clotting Time 199    POCT ACTIVATED CLOTTING TIME     Status: None   Collection Time    05/21/13  9:37 PM      Result Value Range   Activated Clotting Time 188    HEPATITIS B SURFACE ANTIGEN     Status: None   Collection Time    05/22/13 12:42  AM      Result Value Range   Hepatitis B Surface Ag NEGATIVE  NEGATIVE  Comment: Performed at [place]     Status: None   Collection Time    05/22/13  1:59 AM      Result Value Range   Activated Clotting Time 138    CBC     Status: Abnormal   Collection Time    05/22/13  3:09 AM      Result Value Range   WBC 15.3 (*) 4.0 - 10.5 K/uL   Comment: WHITE COUNT CONFIRMED ON SMEAR   RBC 4.88  4.22 - 5.81 MIL/uL   Hemoglobin 15.2  13.0 - 17.0 g/dL   HCT 45.6  39.0 - 52.0 %   MCV 93.4  78.0 - 100.0 fL   MCH 31.1  26.0 - 34.0 pg   MCHC 33.3  30.0 - 36.0 g/dL   RDW 18.0 (*) 11.5 - 15.5 %   Platelets 170  150 - 400 K/uL  BASIC METABOLIC PANEL     Status: Abnormal   Collection Time    05/22/13  6:16 AM      Result Value Range   Sodium 136 (*) 137 - 147 mEq/L   Potassium 4.9  3.7 - 5.3 mEq/L   Chloride 93 (*) 96 - 112 mEq/L   CO2 25  19 - 32 mEq/L   Glucose, Bld 86  70 - 99 mg/dL   BUN 29 (*) 6 - 23 mg/dL   Creatinine, Ser 9.22 (*) 0.50 - 1.35 mg/dL   Calcium 9.3  8.4 - 10.5 mg/dL   GFR calc non Af Amer 6 (*) >90 mL/min   GFR calc Af Amer 6 (*) >90 mL/min   Comment: (NOTE)     The eGFR has been calculated using the CKD EPI equation.     This calculation has not been validated in all clinical situations.     eGFR's persistently <90 mL/min signify possible Chronic Kidney     Disease.  TROPONIN I     Status: Abnormal   Collection Time    05/22/13  4:15 PM      Result Value Range   Troponin I 9.89 (*) <0.30 ng/mL   Comment:            Due to the release kinetics of cTnI,     a negative result within the first hours     of the onset of symptoms does not rule out     myocardial infarction with certainty.     If myocardial infarction is still suspected,     repeat the test at appropriate intervals.     CRITICAL VALUE NOTED.  VALUE IS CONSISTENT WITH PREVIOUSLY REPORTED AND CALLED VALUE.  TROPONIN I     Status: Abnormal   Collection Time     05/22/13  7:56 PM      Result Value Range   Troponin I 12.10 (*) <0.30 ng/mL   Comment:            Due to the release kinetics of cTnI,     a negative result within the first hours     of the onset of symptoms does not rule out     myocardial infarction with certainty.     If myocardial infarction is still suspected,     repeat the test at appropriate intervals.     CRITICAL VALUE NOTED.  VALUE IS CONSISTENT WITH PREVIOUSLY REPORTED AND CALLED VALUE.  LIPID PANEL     Status: Abnormal   Collection Time  05/23/13  4:07 AM      Result Value Range   Cholesterol 121  0 - 200 mg/dL   Triglycerides 032 (*) <150 mg/dL   HDL 20 (*) >12 mg/dL   Total CHOL/HDL Ratio 6.1     VLDL 32  0 - 40 mg/dL   LDL Cholesterol 69  0 - 99 mg/dL   Comment:            Total Cholesterol/HDL:CHD Risk     Coronary Heart Disease Risk Table                         Men   Women      1/2 Average Risk   3.4   3.3      Average Risk       5.0   4.4      2 X Average Risk   9.6   7.1      3 X Average Risk  23.4   11.0                Use the calculated Patient Ratio     above and the CHD Risk Table     to determine the patient's CHD Risk.                ATP III CLASSIFICATION (LDL):      <100     mg/dL   Optimal      248-250  mg/dL   Near or Above                        Optimal      130-159  mg/dL   Borderline      037-048  mg/dL   High      >889     mg/dL   Very High  BASIC METABOLIC PANEL     Status: Abnormal   Collection Time    05/23/13  4:07 AM      Result Value Range   Sodium 130 (*) 137 - 147 mEq/L   Potassium 5.5 (*) 3.7 - 5.3 mEq/L   Chloride 87 (*) 96 - 112 mEq/L   CO2 22  19 - 32 mEq/L   Glucose, Bld 70  70 - 99 mg/dL   BUN 43 (*) 6 - 23 mg/dL   Creatinine, Ser 16.94 (*) 0.50 - 1.35 mg/dL   Calcium 9.5  8.4 - 50.3 mg/dL   GFR calc non Af Amer 4 (*) >90 mL/min   GFR calc Af Amer 5 (*) >90 mL/min   Comment: (NOTE)     The eGFR has been calculated using the CKD EPI equation.     This  calculation has not been validated in all clinical situations.     eGFR's persistently <90 mL/min signify possible Chronic Kidney     Disease.  CBC     Status: Abnormal   Collection Time    05/23/13  4:07 AM      Result Value Range   WBC 13.0 (*) 4.0 - 10.5 K/uL   RBC 4.43  4.22 - 5.81 MIL/uL   Hemoglobin 13.4  13.0 - 17.0 g/dL   HCT 88.8  28.0 - 03.4 %   MCV 94.1  78.0 - 100.0 fL   MCH 30.2  26.0 - 34.0 pg   MCHC 32.1  30.0 - 36.0 g/dL   RDW 91.7 (*) 91.5 - 05.6 %   Platelets 198  150 - 400 K/uL    Imaging: No results found.  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 Ambulate today with cardiac rehab. Appropriate to transfer to telemetry (probably 6E if bed available) - plans for HD today WITHOUT FLUID REMOVAL. BP remains soft, but improving. Peak troponin 12.1.  Leukocytosis is resolving.   Time Spent Directly with Patient:  15 minutes  Length of Stay:  LOS: 2 days   Pixie Casino, MD, [facility] Attending Cardiologist [facility]  HILTY,Kenneth C 05/23/2013, 10:17 AM

## 2013-05-23 NOTE — Progress Notes (Signed)
CARDIAC REHAB PHASE I   PRE:  Rate/Rhythm: 82 SR    BP: sitting 96/48, standing BP did not take    SaO2: 95 RA  MODE:  Ambulation: 150 ft   POST:  Rate/Rhythm: 90 SR    BP: sitting 132/65     SaO2: 91 RA  Pt slightly better today. Used RW and assist x2, followed with recliner as a precaution. C/o hip pain with standing.  Weak, c/o right hand "coldness" with walking. Also c/o knee, back and shoulder pain. X1 rest stop. Pt has very limited exercise ability. Continues to be vague with communication. BP stable after walk. Return to recliner. Encouraged pt to read materials. Education will be difficult.  [id number]  [name] [place] CES, ACSM 05/23/2013 11:12 AM

## 2013-05-24 LAB — CBC
HCT: 40.5 % (ref 39.0–52.0)
Hemoglobin: 13.2 g/dL (ref 13.0–17.0)
MCH: 30.1 pg (ref 26.0–34.0)
MCHC: 32.6 g/dL (ref 30.0–36.0)
MCV: 92.5 fL (ref 78.0–100.0)
Platelets: 229 10*3/uL (ref 150–400)
RBC: 4.38 MIL/uL (ref 4.22–5.81)
RDW: 17.2 % — AB (ref 11.5–15.5)
WBC: 12.7 10*3/uL — ABNORMAL HIGH (ref 4.0–10.5)

## 2013-05-24 LAB — BASIC METABOLIC PANEL
BUN: 35 mg/dL — ABNORMAL HIGH (ref 6–23)
CO2: 23 mEq/L (ref 19–32)
CREATININE: 9.01 mg/dL — AB (ref 0.50–1.35)
Calcium: 9 mg/dL (ref 8.4–10.5)
Chloride: 91 mEq/L — ABNORMAL LOW (ref 96–112)
GFR calc non Af Amer: 6 mL/min — ABNORMAL LOW (ref >90)
GFR, EST AFRICAN AMERICAN: 7 mL/min — AB (ref >90)
GLUCOSE: 89 mg/dL (ref 70–99)
Potassium: 5.2 mEq/L (ref 3.7–5.3)
Sodium: 135 mEq/L — ABNORMAL LOW (ref 137–147)

## 2013-05-24 LAB — HEPATITIS B SURFACE ANTIGEN: HEP B S AG: NEGATIVE

## 2013-05-24 LAB — GLUCOSE, CAPILLARY: Glucose-Capillary: 152 mg/dL — ABNORMAL HIGH (ref 70–99)

## 2013-05-24 LAB — HEPATITIS B SURFACE ANTIBODY,QUALITATIVE: Hep B S Ab: POSITIVE — AB

## 2013-05-24 MED ORDER — ASPIRIN EC 81 MG PO TBEC: 81.0000 mg | DELAYED_RELEASE_TABLET | Freq: Every day | ORAL | Status: DC

## 2013-05-24 MED ORDER — TICAGRELOR 90 MG PO TABS: 90.0000 mg | ORAL_TABLET | Freq: Two times a day (BID) | ORAL | Status: DC

## 2013-05-24 MED ORDER — DOXERCALCIFEROL 4 MCG/2ML IV SOLN
2.0000 ug | INTRAVENOUS | Status: DC
  Administered 2013-05-24: 2 ug via INTRAVENOUS
  Filled 2013-05-24: qty 2

## 2013-05-24 MED ORDER — ACETAMINOPHEN 325 MG PO TABS
650.0000 mg | ORAL_TABLET | Freq: Once | ORAL | Status: AC
  Administered 2013-05-24: 650 mg via ORAL

## 2013-05-24 MED ORDER — CALCIUM ACETATE 667 MG PO CAPS
2668.0000 mg | ORAL_CAPSULE | Freq: Three times a day (TID) | ORAL | Status: DC
  Administered 2013-05-24: 2668 mg via ORAL
  Filled 2013-05-24 (×3): qty 4

## 2013-05-24 MED ORDER — NITROGLYCERIN 0.4 MG SL SUBL: 0.4000 mg | SUBLINGUAL_TABLET | SUBLINGUAL | Status: AC | PRN

## 2013-05-24 MED ORDER — ACETAMINOPHEN 325 MG PO TABS
ORAL_TABLET | ORAL | Status: AC
  Filled 2013-05-24: qty 2

## 2013-05-24 MED ORDER — ATORVASTATIN CALCIUM 80 MG PO TABS: 80.0000 mg | ORAL_TABLET | Freq: Every day | ORAL | Status: DC

## 2013-05-24 MED ORDER — DOXERCALCIFEROL 4 MCG/2ML IV SOLN
INTRAVENOUS | Status: AC
  Filled 2013-05-24: qty 2

## 2013-05-24 MED ORDER — METOPROLOL TARTRATE 25 MG PO TABS: 25.0000 mg | ORAL_TABLET | Freq: Two times a day (BID) | ORAL | Status: DC

## 2013-05-24 MED ORDER — HEPARIN SODIUM (PORCINE) 1000 UNIT/ML IJ SOLN
8000.0000 [IU] | Freq: Once | INTRAMUSCULAR | Status: AC
  Administered 2013-05-24: 8000 [IU] via INTRAVENOUS

## 2013-05-24 NOTE — Progress Notes (Signed)
Subjective:  No complaints, no further chest pain, no dyspnea  Objective: Vital signs in last 24 hours: Temp:  [97.1 F (36.2 C)-99.1 F (37.3 C)] 98.6 F (37 C) (01/28 0403) Pulse Rate:  [70-134] 134 (01/28 0403) Resp:  [13-21] 20 (01/28 0403) BP: (89-132)/(39-89) 89/62 mmHg (01/28 0403) SpO2:  [95 %-100 %] 99 % (01/28 0403) Weight:  [128.9 kg (284 lb 2.8 oz)-130.1 kg (286 lb 13.1 oz)] 130.1 kg (286 lb 13.1 oz) (01/27 1701) Weight change:   Intake/Output from previous day: 01/27 0701 - 01/28 0700 In: 702 [P.O.:702] Out: -123    Lab Results:  Recent Labs  05/22/13 0309 05/23/13 0407  WBC 15.3* 13.0*  HGB 15.2 13.4  HCT 45.6 41.7  PLT 170 198   BMET:  Recent Labs  05/22/13 0616 05/23/13 0407  NA 136* 130*  K 4.9 5.5*  CL 93* 87*  CO2 25 22  GLUCOSE 86 70  BUN 29* 43*  CREATININE 9.22* 11.12*  CALCIUM 9.3 9.5   No results found for this basename: PTH,  in the last 72 hours Iron Studies: No results found for this basename: IRON, TIBC, TRANSFERRIN, FERRITIN,  in the last 72 hours  EXAM: General appearance:  Alert, in no apparent distress Resp:  CTA without rales, rhonchi, or wheezes Cardio:  RRR without murmur or rub GI:  + BS, soft and nontender Extremities: No edema Access:  L thigh AVG with + bruit  Dialysis: [name] [id number]  3.5h 122kg 2K/3.5Ca Bath Hep 11000 L thigh AVG  EPO none Hect 2ug   Assessment/Plan: 1. Acute MI - occluded RCA with STEMI of inferior wall 2. ESRD - HD on MTWF @ AF; K 5.5.  HD pending today. 3. Hypotension - BP 89/62 on Metoprolol 25 mg bid; Cardiology recommends against excess fluid removal until RV recovers and BP improves, but current wt 130.1 kg, chest x-ray shows CHF with pulmonary edema. 4. Anemia - Hgb 15.2, no Epogen or Fe. 5. 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      LOS: 3 days   LYLES,CHARLES 05/24/2013,7:35 AM  I have seen and examined patient, discussed with PA and  agree with assessment and plan as outlined above with additions as indicated.  Patient vol overloaded with mild pulm edema by cxr yest, 8kg over dry wt today. He is tolerating 3-4 kg UF with HD today and lungs are clear now which is an improvement from yesterday. He is without complaints. Will reassess after HD. Vinson Moselle MD pager [phone]    cell [phone] 05/24/2013, 11:45 AM

## 2013-05-24 NOTE — Care Management Note (Signed)
   CARE MANAGEMENT NOTE 05/24/2013  Patient:  [name], [name]   Account Number:  [id number]  Date Initiated:  05/22/2013  Documentation initiated by:  Junius Creamer  Subjective/Objective Assessment:   adm w stemi     Action/Plan:   lives w fam, pcp dr Briant Cedar  Pt given card for Brilinta by CM, Pablo Ledger and prior approval form for Medicaid faxed to Dr Jeffie Pollock nurse, Eileen Stanford who will make sure this is completed.   Anticipated DC Date:  05/24/2013   Anticipated DC Plan:  HOME W HOME HEALTH SERVICES      DC Planning Services  CM consult  Medication Assistance      PAC Choice  DURABLE MEDICAL EQUIPMENT  HOME HEALTH   Choice offered to / List presented to:  C-1 Patient   DME arranged  Levan Hurst      DME agency  [facility]     [facility] arranged  HH-1 RN  HH-2 PT  HH-4 NURSE'S AIDE      HH agency  [facility]   Status of service:  Completed, signed off Medicare Important Message given?   (If response is "NO", the following Medicare IM given date fields will be blank) Date Medicare IM given:   Date Additional Medicare IM given:    Discharge Disposition:  HOME W HOME HEALTH SERVICES  Per UR Regulation:  Reviewed for med. necessity/level of care/duration of stay  If discussed at Long Length of Stay Meetings, dates discussed:    Comments:  05/24/2013 form for Medicaid prior approval for Brilinta given to PA R Barrett, due to incomplete status of form, this CM contacted documented requester contact: Collins Scotland, who stated that she did not handle this type of request however was most helpful and gave this CM the number for Dr [facility] nurse Eileen Stanford at [phone]. Eileen Stanford was also most helpful and stated that she would followup on the completion of the form and instructed this CM to fax the application to her at [phone]. This was faxed at [phone] on 05/24/2013. Johny Shock RN MPH, case manager ,[phone] Addem: Pt was d/c with Brilinta card for 30days of free  medication and prescriptions were called to pt pharmacy by PA prior to this pt d/c. Pt was also d/c with a rollator walker. CRoyal RN MPH   05/24/2013  [address] RN, [place]  [phone] DME:  rolling walker with seat  [facility] called with referral  1/26 1229p debbie dowell rn,bsn pt given brilinta 30day free card. pt states medicaid for meds. placed medicaid prior approval form on shadow chart. brilinta on non preferred list w medicaid. went over list of [facility] agencies and no pref by pt or his sister. ref to debbie w [facility]. pt also working on [employer] himself under [employer].

## 2013-05-24 NOTE — Progress Notes (Signed)
Patient Name: [name] Date of Encounter: 05/24/2013     Principal Problem:   ST-segment elevation myocardial infarction (STEMI) of inferior wall Active Problems:   End-stage renal disease on hemodialysis   Acute myocardial infarction of right ventricle   History of placement of stent in LAD coronary artery   HTN (hypertension)    SUBJECTIVE  Before dialysis pt. was feeling quite short of breath, fatigued, dizzy and weak. He admits to orthopnea, PND, increased abdominal girth, lower extremity swelling. No CP. He does have some shoulder pain and trouble lifting his arm, which is chronic but worse since being in the hospital.  After dialysis he feels much better. He insists that he feels well enough to go home.  CURRENT MEDS . aspirin  81 mg Oral Daily  . atorvastatin  80 mg Oral q1800  . calcium acetate  2,668 mg Oral TID WC  . doxercalciferol  2 mcg Intravenous Q M,W,F-HD  . heparin  5,000 Units Subcutaneous Q8H  . metoprolol tartrate  25 mg Oral BID  . pantoprazole  40 mg Oral Daily  . sodium bicarbonate  650 mg Oral BID  . Ticagrelor  90 mg Oral BID    OBJECTIVE  Filed Vitals:   05/24/13 0830 05/24/13 0835 05/24/13 0900 05/24/13 0930  BP: 110/73 109/70 119/58 110/64  Pulse: 76 80 76 78  Temp: 98.2 F (36.8 C)     TempSrc: Oral     Resp: 17 16 14 14   Height:      Weight: 287 lb 11.2 oz (130.5 kg)     SpO2: 97%       Intake/Output Summary (Last 24 hours) at 05/24/13 0948 Last data filed at 05/24/13 0502  Gross per 24 hour  Intake    462 ml  Output   -123 ml  Net    585 ml   Filed Weights   05/23/13 1404 05/23/13 1701 05/24/13 0830  Weight: 284 lb 2.8 oz (128.9 kg) 286 lb 13.1 oz (130.1 kg) 287 lb 11.2 oz (130.5 kg)    PHYSICAL EXAM  General: Pleasant, NAD. Neuro: Alert and oriented X 3. Moves all extremities spontaneously. Having trouble lifting his L arm 2/2 pain Psych: Normal affect. HEENT:  Normal  Neck: Supple without bruits no JVD  appreciated . Lungs:  Resp regular and unlabored, CTA. Heart: RRR no s3, s4, or murmurs, distant heart sounds. Abdomen: Soft, non-tender, non-distended, BS + x 4.  Extremities: No clubbing, cyanosis or 1+ pitting edema L>R. DP/PT/Radials 1+ and equal bilaterally.  Accessory Clinical Findings  CBC  Recent Labs  05/23/13 0407 05/24/13 0655  WBC 13.0* 12.7*  HGB 13.4 13.2  HCT 41.7 40.5  MCV 94.1 92.5  PLT 198 229   Basic Metabolic Panel  Recent Labs  05/23/13 0407 05/24/13 0655  NA 130* 135*  K 5.5* 5.2  CL 87* 91*  CO2 22 23  GLUCOSE 70 89  BUN 43* 35*  CREATININE 11.12* 9.01*  CALCIUM 9.5 9.0    Cardiac Enzymes  Recent Labs  05/21/13 1649 05/22/13 1615 05/22/13 1956  TROPONINI 8.71* 9.89* 12.10*    Fasting Lipid Panel  Recent Labs  05/23/13 0407  CHOL 121  HDL 20*  LDLCALC 69  TRIG 295*  CHOLHDL 6.1    TELE  NSR  ECG 1/88/41 HR 81. NSR, inferior infarct age undetermined   Radiology/Studies CARDIAC CATH Procedure: Left Heart Cath, Selective Coronary Angiography, LV angiography, PTCA/Stent of proximal LAD. Unsuccessful RCA PCI due  to inability to cross the occlusion.  Indication: This is a 58 year old male with history of end-stage renal disease on hemodialysis for at least 17 years. He presented to [facility] emergency room with chest pain which started on Friday. He decided to seek medical attention after his mother told him to do so. He denied worsening chest pain today. He claims that it has been continuous since Friday. Initial EKG at [facility] emergency room showed minor inferior ST elevation. Troponin came back elevated at 26. Repeat EKG showed more pronounced ST elevation especially in the right-sided leads. He was transferred for emergent cardiac catheterization.  Medications:  Sedation: 3 mg IV Versed, 100 mcg IV Fentanyl, 2 mg of Dilaudid  Contrast: 270 mL Omnipaque  Diagnostic Procedure Details: The right groin was prepped, draped, and  anesthetized with 1% lidocaine. Using the modified Seldinger technique, a 6 French sheath was introduced into the right femoral artery. There was significant difficulty in advancing the sheath due to scar tissue from previous graft surgery. I had to use a 5 [place] dilator and exchange the wire into a Rosen wire. Standard Judkins catheters were used for selective coronary angiography and left ventriculography. Catheter exchanges were performed over a wire.  During the diagnostic procedure, it became clear that the patient could not safely hold still for the procedure due to reported severe back and shoulder pain. According to him he has these chronic pain issues. We gave him escalated doses of sedation and still I felt that it was unsafe to continue as he almost fell off the table. Thus, I requested the assistance from anesthesia. The patient was given propofol in order to safely finish the procedure.  Procedural Findings:  Hemodynamics:  AO: 110/70 mmHg  LV: 112/8 mmHg  LVEDP: 12 mmHg  Coronary angiography:  Coronary dominance: Likely codominant  Left Main: Normal  Left Anterior Descending (LAD): Large in size and mildly calcified. There is 95% tubular stenosis proximally. There is 50% stenosis in the midsegment. The rest of the vessel has minor irregularities.  1st diagonal (D1): Small in size.  2nd diagonal (D2): Small in size.  3rd diagonal (D3): Small in size.  Circumflex (LCx): Normal in size and mildly calcified. There is 80% ostial stenosis supplying mainly on the posterior lateral branch. The vessel courses in the AV groove. OM distribution is also applied by a large ramus branch.  Ramus Intermedius: Large in size with 20% proximal disease. This gives 3 branches which have minor irregularities.  Right Coronary Artery: Normal in size. The vessel is mildly calcified. It is occluded in the mid segment with TIMI 0 flow. No collaterals are noted distally. Left ventriculography: Left ventricular  systolic function is normal , LVEF is estimated at 55 %, there is no significant mitral regurgitation . Moderate basal inferior wall hypokinesis.  PCI Procedure Note: Following the diagnostic procedure, the decision was made to proceed with PCI. The sheath was upsized to a 6 [place]. Weight-based bivalirudin was given for anticoagulation. Once a therapeutic ACT was achieved, a 6 [place] JR 4 guide catheter was inserted. I initially used an intuition wire which did not cross the occlusion. A run through wire was then used and also was unsuccessful. This was followed by a whisper wire which did not cross. A Miracle Bro 3 wire was used and also did not cross the occlusion in spite of placing a wire and the RV branch at the site of occlusion. At this point, I uscrubbed and discussed the case with Dr.  Bartel regarding emergent CABG given the presence of three-vessel coronary artery disease. He felt that the patient would be too high risk and he probably completed his inferior infarct given that symptoms started on Friday. I thus decided to treat the high grade stenosis in the proximal LAD. An XB LAD 3.5 guiding catheter was used. The lesion was crossed with an intuition wire. The lesion was predilated with a 2.5 x 12 balloon. The lesion was then stented with a 3.0 x 18 Xience drug-eluting stent. The stent was postdilated with a 3.5 x 12 noncompliant balloon. Following PCI, there was 0% residual stenosis and TIMI-3 flow. Final angiography confirmed an excellent result. The sheath was sutured in place to be removed manually. The patient was transferred to the intensive care unit. His vitals were stable except for mild sinus tachycardia.  PCI Data:  Vessel - mid RCA/Segment - mid  Percent Stenosis (pre) 100%  TIMI-flow 0  Unsuccessful PCI due to inability to cross the occlusion which might be chronic.  Vessel - LAD/Segment - proximal  Percent Stenosis (pre) 95%  TIMI-flow 3  Stent 3.0 x 18 mm Xience drug-eluting  stent postdilated with a 3.5 noncompliant balloon  Percent Stenosis (post) 0  TIMI-flow (post) 3  Final Conclusions:  1. Severe three-vessel coronary artery disease. Occluded mid RCA to the culprit for inferior ST elevation myocardial infarction. However, this is a late presentation of at least 48 hours. There is high grade proximal LAD stenosis and significant ostial left circumflex stenosis.  2. High normal left ventricular end-diastolic pressure of with normal LV systolic function with ejection fraction of 55% and moderate inferior wall hypokinesis  3. Unsuccessful RCA PCI due to inability to cross the occlusion in spite of using 4 different wires.  4. successful angioplasty and drug-eluting stent placement to the proximal LAD  Dg Chest Port 1 View  05/23/2013   CLINICAL DATA:  Pulmonary edema.  EXAM: PORTABLE CHEST - 1 VIEW  COMPARISON:  05/21/2013 .  FINDINGS: Surgical clips upper chest. Cardiomegaly. Mild pulmonary vascular congestion cannot be excluded. Prominent pulmonary interstitial markings again noted consistent with pulmonary interstitial edema. Minimal interval improvement. No pleural effusion or pneumothorax.  IMPRESSION: 1. Persistent congestive heart failure and pulmonary interstitial edema. 2. Superimposed interstitial pneumonitis cannot be excluded.    ASSESSMENT AND PLAN This is a 58 year old male with history of end-stage renal disease on hemodialysis for at least 17 years who presented with inferior STEMI on 05/20/13 and underwent emergent cardiac cath. He had severe three-vessel coronary artery disease. Occluded mid RCA to the culprit for inferior ST elevation myocardial infarction. However, this was a late presentation of at least 48 hours and it could not be crossed. S/p DES to proximal LAD. A component of RV infarction is suspected with RCA occlusion as the patient is becoming very hypotensive with ambulation.    Acute inferior STEMI - occluded RCA with STEMI of inferior  wall.  -- S/p DES to the proximal LAD. Continue DAPT Some elemnet of RV infarct.  -- HD yesterday without fluid removal. Fluid was removed today without hypotension. -- Echo performed had poor acoustic windows, but grossly normal LV function.  -- Continue cardiac rehab  ESRD - HD on [facility], having issues with hypotension and hyperkalemia, being followed by nephrology.   Hypotension - BP was 89/62 on Metoprolol 25 mg bid. BP stable now.  Signed, Thereasa Parkin PA-C Patient seen and examined. I agree with the assessment and plan as detailed above. See also  my additional thoughts below.   I carefully reviewed all data with Thereasa ParkinKathryn Stern, PA-C and the patient. He insists that he is stable and ready to go home. We are carefully making all home health arrangements.   Willa RoughJeffrey Yariana Hoaglund, MD, [facility] IncFACC 05/24/2013 2:12 PM

## 2013-05-24 NOTE — Discharge Instructions (Signed)
PLEASE REMEMBER TO BRING ALL OF YOUR MEDICATIONS TO EACH OF YOUR FOLLOW-UP OFFICE VISITS. ° °PLEASE ATTEND ALL SCHEDULED FOLLOW-UP APPOINTMENTS.  ° °Activity: Increase activity slowly as tolerated. You may shower, but no soaking baths (or swimming) for 1 week. No driving for 1 week. No lifting over 5 lbs for 2 weeks. No sexual activity for 1 week.  ° °You May Return to Work: in 3 weeks (if applicable) ° °Wound Care: You may wash cath site gently with soap and water. Keep cath site clean and dry. If you notice pain, swelling, bleeding or pus at your cath site, please call [phone]. ° ° ° °Cardiac Cath Site Care °Refer to this sheet in the next few weeks. These instructions provide you with information on caring for yourself after your procedure. Your caregiver may also give you more specific instructions. Your treatment has been planned according to current medical practices, but problems sometimes occur. Call your caregiver if you have any problems or questions after your procedure. °HOME CARE INSTRUCTIONS °· You may shower 24 hours after the procedure. Remove the bandage (dressing) and gently wash the site with plain soap and water. Gently pat the site dry.  °· Do not apply powder or lotion to the site.  °· Do not sit in a bathtub, swimming pool, or whirlpool for 5 to 7 days.  °· No bending, squatting, or lifting anything over 10 pounds (4.5 kg) as directed by your caregiver.  °· Inspect the site at least twice daily.  °· Do not drive home if you are discharged the same day of the procedure. Have someone else drive you.  °· You may drive 24 hours after the procedure unless otherwise instructed by your caregiver.  °What to expect: °· Any bruising will usually fade within 1 to 2 weeks.  °· Blood that collects in the tissue (hematoma) may be painful to the touch. It should usually decrease in size and tenderness within 1 to 2 weeks.  °SEEK IMMEDIATE MEDICAL CARE IF: °· You have unusual pain at the site or down the  affected limb.  °· You have redness, warmth, swelling, or pain at the site.  °· You have drainage (other than a small amount of blood on the dressing).  °· You have chills.  °· You have a fever or persistent symptoms for more than 72 hours.  °· You have a fever and your symptoms suddenly get worse.  °· Your leg becomes pale, cool, tingly, or numb.  °· You have heavy bleeding from the site. Hold pressure on the site.  °Document Released: 05/16/2010 Document Revised: 04/02/2011 Document Reviewed:  ° °

## 2013-05-24 NOTE — Procedures (Signed)
I was present at this dialysis session, have reviewed the session itself and made  appropriate changes  Vinson Moselle MD (pgr) [phone]    [phone] 05/24/2013, 11:48 AM

## 2013-05-24 NOTE — Discharge Summary (Signed)
CARDIOLOGY DISCHARGE SUMMARY   Patient ID: [name] MRN: [id number] DOB/AGE: February 03, 1956 58 y.o.  Admit date: 05/21/2013 Discharge date: 05/24/2013  PCP: Dyke Maes, MD Primary Cardiologist:   Primary Discharge Diagnosis:  ST-segment elevation myocardial infarction (STEMI) of inferior wall - 3.0 x 18 mm Xience drug-eluting stent to the LAD, Unsuccessful PCI of the RCA   Secondary Discharge Diagnosis:    End-stage renal disease on hemodialysis   Acute myocardial infarction of right ventricle   History of placement of stent in LAD coronary artery   HTN (hypertension)  Consults: Nephrology  Procedures: Left Heart Cath, Selective Coronary Angiography, LV angiography, PTCA/Stent of proximal LAD. Unsuccessful RCA PCI due to inability to cross the occlusion  Hospital Course: [name] is a 58 y.o. male with no previous history of CAD. He is ESRD on HD, DM, neuropathy, HTN, HL.   He had chest pain and went to [facility] the next day. His ECG was abnormal and showed borderline ST elevation. His troponin was elevated and his ECG became more abnormal. He was transferred to [facility] and taken directly to the cath lab.  Results are below. He had 3 vessel disease, but he was not felt to be a candidate for emergent CABG. He had a DES to the LAD and PCI to the RCA was attempted but it was occluded and could not be crossed. The CFX also had high-grade stenosis and will be treated medically. His EF was preserved.   There were problems with sedation as he has chronic MS pain issues. Anesthesia assisted during the case and propofol was used for sedation. With this, he tolerated the procedure without complication.  He was started on a beta blocker and a statin. He is not on an ACE inhibitor because of his renal dysfunction.   A ventriculogram performed during the cath showed some basal wall hypokinesis. An echocardiogram was performed but the images were poor. He is felt  to have RV involvement and his volume will need to be followed very closely.   A Nephrology consult was called to manage his volume status. He had some volume on his CXR, but this was controlled at HD. The nephrology team coordinated his HD appointments and determined his post-HD weight goal. He is to keep all HD appointments.    He was seen by cardiac rehab and educated on MI restrictions, heart-healthy lifestyle modifications and exercise guidelines. He needed a rolling walker and this was ordered. He will also have a HHRN, PT and an aide.   On 01/28, he was seen by Dr. Myrtis Ser and Dr. Arlean Hopping. He had dialysis. [name] was very sure he was well enough to go home. Dr. Myrtis Ser reviewed all data and felt [name] could be considered stable for discharge, to follow up as an outpatient.  Labs:  Lab Results  Component Value Date   WBC 12.7* 05/24/2013   HGB 13.2 05/24/2013   HCT 40.5 05/24/2013   MCV 92.5 05/24/2013   PLT 229 05/24/2013     Recent Labs Lab 05/24/13 0655  NA 135*  K 5.2  CL 91*  CO2 23  BUN 35*  CREATININE 9.01*  CALCIUM 9.0  GLUCOSE 89    Recent Labs  05/21/13 1649 05/22/13 1615 05/22/13 1956  TROPONINI 8.71* 9.89* 12.10*   Lipid Panel     Component Value Date/Time   CHOL 121 05/23/2013 0407   TRIG 162* 05/23/2013 0407   HDL 20* 05/23/2013 0407  CHOLHDL 6.1 05/23/2013 0407   VLDL 32 05/23/2013 0407   LDLCALC 69 05/23/2013 0407    Radiology: Dg Chest Port 1 View 05/23/2013   CLINICAL DATA:  Pulmonary edema.  EXAM: PORTABLE CHEST - 1 VIEW  COMPARISON:  05/21/2013 .  FINDINGS: Surgical clips upper chest. Cardiomegaly. Mild pulmonary vascular congestion cannot be excluded. Prominent pulmonary interstitial markings again noted consistent with pulmonary interstitial edema. Minimal interval improvement. No pleural effusion or pneumothorax.  IMPRESSION: 1. Persistent congestive heart failure and pulmonary interstitial edema. 2. Superimposed interstitial pneumonitis cannot  be excluded.   Electronically Signed   By: Maisie Fushomas  Register   On: 05/23/2013 16:55    Cardiac Cath:  05/21/2013 Procedural Findings:  Hemodynamics:  AO: 110/70 mmHg  LV: 112/8 mmHg  LVEDP: 12 mmHg  Coronary angiography:  Coronary dominance: Likely codominant  Left Main: Normal  Left Anterior Descending (LAD): Large in size and mildly calcified. There is 95% tubular stenosis proximally. There is 50% stenosis in the midsegment. The rest of the vessel has minor irregularities.  1st diagonal (D1): Small in size.  2nd diagonal (D2): Small in size.  3rd diagonal (D3): Small in size.  Circumflex (LCx): Normal in size and mildly calcified. There is 80% ostial stenosis supplying mainly on the posterior lateral branch. The vessel courses in the AV groove. OM distribution is also applied by a large ramus branch.  Ramus Intermedius: Large in size with 20% proximal disease. This gives 3 branches which have minor irregularities.  Right Coronary Artery: Normal in size. The vessel is mildly calcified. It is occluded in the mid segment with TIMI 0 flow. No collaterals are noted distally. Left ventriculography: Left ventricular systolic function is normal , LVEF is estimated at 55 %, there is no significant mitral regurgitation . Moderate basal inferior wall hypokinesis.  PCI Procedure Note: Following the diagnostic procedure, the decision was made to proceed with PCI. The sheath was upsized to a 6 Jamaica[place]. Weight-based bivalirudin was given for anticoagulation. Once a therapeutic ACT was achieved, a 6 JamaicaFrench JR 4 guide catheter was inserted. I initially used an intuition wire which did not cross the occlusion. A run through wire was then used and also was unsuccessful. This was followed by a whisper wire which did not cross. A Miracle Bro 3 wire was used and also did not cross the occlusion in spite of placing a wire and the RV branch at the site of occlusion. At this point, I uscrubbed and discussed the case with  Dr. Lavinia SharpsBartel regarding emergent CABG given the presence of three-vessel coronary artery disease. He felt that the patient would be too high risk and he probably completed his inferior infarct given that symptoms started on Friday. I thus decided to treat the high grade stenosis in the proximal LAD. An XB LAD 3.5 guiding catheter was used. The lesion was crossed with an intuition wire. The lesion was predilated with a 2.5 x 12 balloon. The lesion was then stented with a 3.0 x 18 Xience drug-eluting stent. The stent was postdilated with a 3.5 x 12 noncompliant balloon. Following PCI, there was 0% residual stenosis and TIMI-3 flow. Final angiography confirmed an excellent result. The sheath was sutured in place to be removed manually. The patient was transferred to the intensive care unit. His vitals were stable except for mild sinus tachycardia.  PCI Data: Vessel - mid RCA/Segment - mid  Percent Stenosis (pre) 100%  TIMI-flow 0  Unsuccessful PCI due to inability to cross the occlusion  which might be chronic.  Vessel - LAD/Segment - proximal  Percent Stenosis (pre) 95%  TIMI-flow 3  Stent 3.0 x 18 mm Xience drug-eluting stent postdilated with a 3.5 noncompliant balloon  Percent Stenosis (post) 0  TIMI-flow (post) 3  Final Conclusions:  1. Severe three-vessel coronary artery disease. Occluded mid RCA to the culprit for inferior ST elevation myocardial infarction. However, this is a late presentation of at least 48 hours. There is high grade proximal LAD stenosis and significant ostial left circumflex stenosis.  2. High normal left ventricular end-diastolic pressure of with normal LV systolic function with ejection fraction of 55% and moderate inferior wall hypokinesis  3. Unsuccessful RCA PCI due to inability to cross the occlusion in spite of using 4 different wires.  4. successful angioplasty and drug-eluting stent placement to the proximal LAD  Recommendations:  The patient came with late presentation  inferior ST elevation myocardial infarction of at least 48 hours of duration. The RCA occlusion could not be crossed. I discussed with cardiothoracic surgery regarding the option of proceeding with emergent CABG especially that she has three-vessel coronary artery disease. It was felt that he would be too high risk in the acute setting especially that he is a dialysis patient with risk of hyperkalemia and other metabolic abnormalities. Also the distal RCA was not visualized and thus it's hard to know if there is a graftable vessel in that area. Although there is significant ostial left circumflex stenosis, the vessel goes in the AV groove and likely is not graftable. all OM territory supplied by a large ramus artery which is free of obstructive disease.  Due to all of the above, it was felt that it would be best to treat his inferior MI medically given that he presented more than 48 hours from onset of symptoms and treat the high grade maximal LAD stenosis percutaneously.  This was a very difficult case as we could not sedate the patient to safely finish the procedure. It was done with the presence of anesthesia who administered propofol throughout the case.   EKG:  05/22/2013 SR Vent. rate 81 BPM PR interval 176 ms QRS duration 86 ms QT/QTc 408/473 ms P-R-T axes 55 30 54  Echo: 05/22/2013 Conclusions Left ventricle: Poor acoustic window limits study. Overall LV systolic funciton is normal with no definite wall motion abnormalities. The cavity size was normal. Wall thickness was increased in a pattern of mild LVH.   FOLLOW UP PLANS AND APPOINTMENTS No Known Allergies   Medication List    STOP taking these medications       amLODipine 10 MG tablet  Commonly known as:  NORVASC      TAKE these medications       aspirin EC 81 MG tablet  Take 1 tablet (81 mg total) by mouth daily.     atorvastatin 80 MG tablet  Commonly known as:  LIPITOR  Take 1 tablet (80 mg total) by mouth daily  at 6 PM.     b complex-vitamin c-folic acid 0.8 MG Tabs tablet  Take 0.8 mg by mouth at bedtime.     calcium acetate 667 MG capsule  Commonly known as:  PHOSLO  Take 2,668 mg by mouth 3 (three) times daily with meals.     colchicine 0.6 MG tablet  Take 0.6 mg by mouth 2 (two) times daily as needed. For gout     cyclobenzaprine 5 MG tablet  Commonly known as:  FLEXERIL  Take 1 tablet (5  mg total) by mouth 2 (two) times daily as needed for muscle spasms.     diphenhydrAMINE 25 MG tablet  Commonly known as:  BENADRYL  Take 25 mg by mouth every 6 (six) hours as needed. For itching     esomeprazole 40 MG capsule  Commonly known as:  NEXIUM  Take 40 mg by mouth every evening.     gabapentin 100 MG capsule  Commonly known as:  NEURONTIN  Take 100 mg by mouth at bedtime as needed. For nerve pain     metoprolol tartrate 25 MG tablet  Commonly known as:  LOPRESSOR  Take 1 tablet (25 mg total) by mouth 2 (two) times daily.     nitroGLYCERIN 0.4 MG SL tablet  Commonly known as:  NITROSTAT  Place 1 tablet (0.4 mg total) under the tongue every 5 (five) minutes as needed for chest pain.     sodium bicarbonate 650 MG tablet  Take 650 mg by mouth 2 (two) times daily.     Ticagrelor 90 MG Tabs tablet  Commonly known as:  BRILINTA  Take 1 tablet (90 mg total) by mouth 2 (two) times daily.        Discharge Orders   Future Appointments Provider Department Dept Phone   06/08/2013 2:15 PM Chrystie Nose, MD [facility] [phone]   Future Orders Complete By Expires   Diet - low sodium heart healthy  As directed    Scheduling Instructions:     Renal Diet   Increase activity slowly  As directed      Follow-up Information   Follow up with Chrystie Nose, MD On 06/08/2013. (At 2:15 pm)    Specialty:  Cardiology   Contact information:   [address] [place] [address] [phone]       BRING ALL MEDICATIONS WITH YOU TO FOLLOW UP  APPOINTMENTS  Time spent with patient to include physician time: 48 min Signed: Theodore Demark, PA-C 05/24/2013, 3:04 PM Patient seen and examined. I agree with the assessment and plan as detailed above. See also my additional thoughts below.   See also my progress note from today.  Willa Rough, MD, [facility] 05/24/2013 3:51 PM

## 2013-05-24 NOTE — Progress Notes (Signed)
   CARE MANAGEMENT NOTE 05/24/2013  Patient:  [name], [name]   Account Number:  [id number]  Date Initiated:  05/22/2013  Documentation initiated by:  Junius Creamer  Subjective/Objective Assessment:   adm w stemi     Action/Plan:   lives w fam, pcp dr Briant Cedar   Anticipated DC Date:  05/24/2013   Anticipated DC Plan:  HOME/SELF CARE      DC Planning Services  CM consult  Medication Assistance      PAC Choice  DURABLE MEDICAL EQUIPMENT  HOME HEALTH   Choice offered to / List presented to:  C-1 Patient   DME arranged  Levan Hurst      DME agency  [facility]     [facility] arranged  HH-1 RN  HH-2 PT  HH-4 NURSE'S AIDE      HH agency  [facility]   Status of service:  Completed, signed off Medicare Important Message given?   (If response is "NO", the following Medicare IM given date fields will be blank) Date Medicare IM given:   Date Additional Medicare IM given:    Discharge Disposition:  HOME W HOME HEALTH SERVICES  Per UR Regulation:  Reviewed for med. necessity/level of care/duration of stay  If discussed at Long Length of Stay Meetings, dates discussed:    Comments:  05/24/2013  1500 Darlyne Russian RN, [place]  [phone] DME:  rolling walker with seat  [facility] called with referral  1/26 1229p debbie dowell rn,bsn pt given brilinta 30day free card. pt states medicaid for meds. placed medicaid prior approval form on shadow chart. brilinta on non preferred list w medicaid. went over list of hhc agencies and no pref by pt or his sister. ref to debbie w [facility] for rn-pt[facility]. pt also working on [employer] himself under [employer] [employer].

## 2013-05-24 NOTE — Progress Notes (Signed)
Discharge information reviewed with pt; allowed pt to ask questions. Pt verbalized understanding. IV removed. Pt received rolling walker prior to leaving the unit. Pt discharged to home with family.

## 2013-05-24 NOTE — Progress Notes (Signed)
Patient going home today, I have set up an extra 3 hr HD for him tomorrow at [facility] at noon and he is agreeable to go.   Vinson Moselle MD (pgr) [phone]    ([phone] 05/24/2013, 2:43 PM

## 2013-05-24 NOTE — Progress Notes (Signed)
CARDIAC REHAB PHASE I   PRE:  Rate/Rhythm: 88 (pulse ox)    BP: sitting 117/65, standing 123/82    SaO2: 99 RA  MODE:  Ambulation: 150 ft   POST:  Rate/Rhythm: 96     BP: sitting 119/77     SaO2: 95 RA  Pt for d/c. Used RW and assist x2. Fairly stable for his baseline. His max is about 150 ft with RW. He would benefit from a rollator. Only has cane for home. Also needs HH services. He is stating he will be having an aide at home. Not sure if this is organized yet. Discussed with pt NTG, smoking cessation and gave resources to quit. He sts he is done with smoking. Pt is clearer thinking today.  [phone]   Elissa Lovett [place] CES, ACSM 05/24/2013 2:52 PM

## 2013-06-08 ENCOUNTER — Ambulatory Visit (INDEPENDENT_AMBULATORY_CARE_PROVIDER_SITE_OTHER): Payer: Medicare Other | Admitting: Internal Medicine

## 2013-06-08 ENCOUNTER — Telehealth: Payer: Self-pay | Admitting: *Deleted

## 2013-06-08 ENCOUNTER — Encounter: Payer: Self-pay | Admitting: Internal Medicine

## 2013-06-08 VITALS — BP 132/62 | HR 78 | Ht 71.0 in | Wt 283.9 lb

## 2013-06-08 DIAGNOSIS — Z992 Dependence on renal dialysis: Secondary | ICD-10-CM

## 2013-06-08 DIAGNOSIS — I2510 Atherosclerotic heart disease of native coronary artery without angina pectoris: Secondary | ICD-10-CM

## 2013-06-08 DIAGNOSIS — I219 Acute myocardial infarction, unspecified: Secondary | ICD-10-CM

## 2013-06-08 DIAGNOSIS — I2119 ST elevation (STEMI) myocardial infarction involving other coronary artery of inferior wall: Secondary | ICD-10-CM

## 2013-06-08 DIAGNOSIS — I2129 ST elevation (STEMI) myocardial infarction involving other sites: Secondary | ICD-10-CM

## 2013-06-08 DIAGNOSIS — Z9861 Coronary angioplasty status: Secondary | ICD-10-CM

## 2013-06-08 DIAGNOSIS — Z955 Presence of coronary angioplasty implant and graft: Secondary | ICD-10-CM

## 2013-06-08 DIAGNOSIS — N186 End stage renal disease: Secondary | ICD-10-CM

## 2013-06-08 DIAGNOSIS — I10 Essential (primary) hypertension: Secondary | ICD-10-CM

## 2013-06-08 MED ORDER — TICAGRELOR 90 MG PO TABS: 90.0000 mg | ORAL_TABLET | Freq: Two times a day (BID) | ORAL | Status: DC

## 2013-06-08 NOTE — Patient Instructions (Signed)
Your physician wants you to follow-up in:  6 months. You will receive a reminder letter in the mail two months in advance. If you don't receive a letter, please call our office to schedule the follow-up appointment.   

## 2013-06-08 NOTE — Telephone Encounter (Signed)
Faxed prior authorization for Brilinta

## 2013-06-08 NOTE — Progress Notes (Signed)
OFFICE NOTE  Chief Complaint:  Fatigued, tired  Primary Care Physician: Dyke Maes, MD  HPI:  [name] is a 58 y.o. male with no previous history of CAD. He is ESRD on HD, DM, neuropathy, HTN, HL. He had chest pain and went to [facility] the next day. His ECG was abnormal and showed borderline ST elevation. His troponin was elevated and his ECG became more abnormal. He was transferred to [facility] and taken directly to the cath lab. Results are below. He had 3 vessel disease, but he was not felt to be a candidate for emergent CABG. He had a DES to the LAD and PCI to the RCA was attempted but it was occluded and could not be crossed. The CFX also had high-grade stenosis and will be treated medically. His EF was preserved. There were problems with sedation as he has chronic MS pain issues. Anesthesia assisted during the case and propofol was used for sedation. With this, he tolerated the procedure without complication. He was started on a beta blocker and a statin. He is not on an ACE inhibitor because of his renal dysfunction. A ventriculogram performed during the cath showed some basal wall hypokinesis. An echocardiogram was performed but the images were poor. 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 A Nephrology consult was called to manage his volume status. He had some volume on his CXR, but this was controlled at HD. The nephrology team coordinated his HD appointments and determined his post-HD weight goal. He is to keep all HD appointments. He was seen by cardiac rehab and educated on MI restrictions, heart-healthy lifestyle modifications and exercise guidelines. He needed a rolling walker and this was ordered. He will also have a HHRN, PT and an aide.  On 01/28, he was seen by Dr. Myrtis Ser and Dr. Arlean Hopping. He had dialysis. [name] was very sure he was well enough to go home.   Today he follows up from his hospitalization. He  reports ongoing fatigue, weakness, occasional lightheadedness and dizziness with positional changes. He denies any chest pain however. He is interested in ongoing home health care and is asking for disability. Apparently he is ready on some disability, possibly arrange through nephrology, but he wonders if he can qualify for people to come and clean his house. He has not started any cardiac rehabilitation but would qualify for this. 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  PMHx:  Past Medical History  Diagnosis Date  . Anxiety   . Back pain   . GERD (gastroesophageal reflux disease)   . Peripheral neuropathy   . Arthritis   . CHF (congestive heart failure)   . Active smoker   . ESRD on hemodialysis     [name] 4 days per week on M-Tu-Wed and Fri.  Started HD in 1998 and has been on HD initially at [facility], then went to [facility], then in [facility], then to [facility] and now is at [facility] for last 10 years.  Has L thigh AVG.     . Diabetes mellitus     diet controlled  . 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 PONV (  postoperative nausea and vomiting)   . Hypertension   . Bell palsy   . Heart murmur   . Carpal tunnel syndrome     bilateral  . Dysrhythmia     Hx: of palpitataions  . Headache(784.0)     Hx: of Migraines    Past Surgical History  Procedure Laterality Date  . Total hip arthroplasty    . Thyroidectomy    . Tooth extraction    . Mandible fracture surgery    . Dg av dialysis graft declot or    . Insertion of dialysis catheter  03/28/2011    Procedure: INSERTION OF DIALYSIS CATHETER;  Surgeon: Pryor OchoaJames D Lawson, MD;  [facility];  Service: Vascular;  Laterality: Left;  . Av fistula placement  03/31/2011    Procedure: INSERTION OF ARTERIOVENOUS (AV)  GORE-TEX GRAFT THIGH;  Surgeon: Sherren Kernsharles E Fields, MD;  Location: [facility] OR;  Service: Vascular;  Laterality: Right;  redo right thigh arteriovenous gortex graft using gore-tex stretch 6mm x 70cm  . Thrombectomy w/ embolectomy  06/02/2011    Procedure: THROMBECTOMY ARTERIOVENOUS GORE-TEX GRAFT;  Surgeon: Chuck Hinthristopher S Dickson, MD;  [facility];  Service: Vascular;  Laterality: Right;  Thrombectomy right thigh arteriovenous gortex graft;  revision by  replacement of large portion of graft with 7mm gore-tex   . Insertion of dialysis catheter  08/28/2011    Procedure: INSERTION OF DIALYSIS CATHETER;  Surgeon: Fransisco HertzBrian L Chen, MD;  [facility];  Service: Vascular;  Laterality: Left;  Atempted Bilateral Internal Jugular, Bilateral Subclavin insertion of 55cm Dialysis Catheter Left Femoral  . Insertion of dialysis catheter  12/15/2011    Procedure: INSERTION OF DIALYSIS CATHETER;  Surgeon: Sherren Kernsharles E Fields, MD;  [facility];  Service: Vascular;  Laterality: Right;  Insertion of Right Femoral Dialysis Catheter  . Exchange of a dialysis catheter  02/26/2012    Procedure: EXCHANGE OF A DIALYSIS CATHETER;  Surgeon: Larina Earthlyodd F Early, MD;  [facility];  Service: Vascular;  Laterality: Right;  . Joint replacement    . Exchange of a dialysis catheter  05/18/2012    Procedure: EXCHANGE OF A DIALYSIS CATHETER;  Surgeon: Larina Earthlyodd F Early, MD;  [facility];  Service: Vascular;  Laterality: Right;  right femoral dialysis catheter  . Av fistula placement  05/18/2012    Procedure: INSERTION OF ARTERIOVENOUS (AV) GORE-TEX GRAFT THIGH;  Surgeon: Larina Earthlyodd F Early, MD;  [facility];  Service: Vascular;  Laterality: Left;  using 6mm by 70cm goretex graft  . Thrombectomy w/ embolectomy Left 08/25/2012    Procedure: THROMBECTOMY ARTERIOVENOUS GORE-TEX GRAFT;  Surgeon: Pryor OchoaJames D Lawson, MD;  [facility];  Service: Vascular;  Laterality: Left;  Attempted thrombectomy of left thigh arteriovenous gortex graft.   . Av fistula placement  Left 08/25/2012    Procedure: INSERTION OF ARTERIOVENOUS (AV) GORE-TEX GRAFT THIGH;  Surgeon: Pryor OchoaJames D Lawson, MD;  [name];  Service: Vascular;  Laterality: Left;  Using 6mm x 40cm vascular Gortex graft.  . Revision of arteriovenous goretex graft Left 12/14/2012    Procedure: Revision of Left Thigh Graft;  Surgeon: Larina Earthlyodd F Early, MD;  [facility];  Service: Vascular;  Laterality: Left;  . Avgg removal Left 02/16/2013    Procedure: EXCISION OF LEFT ARM ARTERIOVENOUS GORETEX GRAFT TIMES 2 WITH VEIN PATCH ANGIOPLASTY OF BRACIAL ARTERY.  ;  Surgeon: Chuck Hinthristopher S Dickson, MD;  [facility]: [facility] OR;  Service: Vascular;  Laterality: Left;  Converted from MAC to General.  FAMHx:  Family History  Problem Relation Age of Onset  . Diabetes Mother   . Stroke Mother   . Heart disease Mother   . Kidney disease Father   . Hyperlipidemia Father     SOCHx:   reports that he has been smoking Cigarettes.  He has a 40 pack-year smoking history. He has never used smokeless tobacco. He reports that he does not drink alcohol or use illicit drugs.  ALLERGIES:  No Known Allergies  ROS: A comprehensive review of systems was negative except for: Constitutional: positive for fatigue and malaise Respiratory: positive for dyspnea on exertion Cardiovascular: positive for dyspnea and lower extremity edema Genitourinary: positive for ESRD on HD  HOME MEDS: Current Outpatient Prescriptions  Medication Sig Dispense Refill  . aspirin 81 MG tablet Take 1 tablet (81 mg total) by mouth daily.      Marland Kitchen atorvastatin (LIPITOR) 80 MG tablet Take 1 tablet (80 mg total) by mouth daily at 6 PM.  30 tablet  11  . b complex-vitamin c-folic acid (NEPHRO-VITE) 0.8 MG TABS Take 0.8 mg by mouth at bedtime.       . calcium acetate (PHOSLO) 667 MG capsule Take 2,668 mg by mouth 3 (three) times daily with meals.        . colchicine 0.6 MG tablet Take 0.6 mg by mouth 2 (two) times daily as needed. For gout      .  cyclobenzaprine (FLEXERIL) 5 MG tablet Take 1 tablet (5 mg total) by mouth 2 (two) times daily as needed for muscle spasms.  20 tablet  0  . diphenhydrAMINE (BENADRYL) 25 MG tablet Take 25 mg by mouth every 6 (six) hours as needed. For itching      . esomeprazole (NEXIUM) 40 MG capsule Take 40 mg by mouth every evening.       . gabapentin (NEURONTIN) 100 MG capsule Take 100 mg by mouth at bedtime as needed. For nerve pain      . metoprolol tartrate (LOPRESSOR) 25 MG tablet Take 1 tablet (25 mg total) by mouth 2 (two) times daily.  60 tablet  11  . nitroGLYCERIN (NITROSTAT) 0.4 MG SL tablet Place 1 tablet (0.4 mg total) under the tongue every 5 (five) minutes as needed for chest pain.  25 tablet  3  . oxyCODONE (OXY IR/ROXICODONE) 5 MG immediate release tablet Take 1 tablet by mouth 3 (three) times daily as needed.      . sodium bicarbonate 650 MG tablet Take 650 mg by mouth 2 (two) times daily.        . Ticagrelor (BRILINTA) 90 MG TABS tablet Take 1 tablet (90 mg total) by mouth 2 (two) times daily.  48 tablet  0  . traZODone (DESYREL) 50 MG tablet Take 1 tablet by mouth at bedtime as needed.       No current facility-administered medications for this visit.    LABS/IMAGING: No results found for this or any previous visit (from the past 48 hour(s)). No results found.  VITALS: BP 132/62  Pulse 78  Ht 5\' 11"  (1.803 m)  Wt 283 lb 14.4 oz (128.776 kg)  BMI 39.61 kg/m2  EXAM: General appearance: alert, appears older than stated age, no distress and moderately obese Neck: no carotid bruit and no JVD Lungs: diminished breath sounds bibasilar Heart: regular rate and rhythm Abdomen: soft, non-tender; bowel sounds normal; no masses,  no organomegaly and obese Extremities: edema 1+ bilatera Pulses: 2+ and symmetric Skin: Skin color, texture, turgor  normal. No rashes or lesions Neurologic: Mental status: Alert, oriented, thought content appropriate Psych: Slow, fatigued  appearing  EKG: Normal sinus rhythm at 78  ASSESSMENT: 1. STEMI status post DES to the LAD, and occluded RCA which may be subacute to chronic (thought to be culprit) 2. 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 ESRD on HD 4. Dyslipidemia 5. Near morbid obesity  PLAN: 1.   Mr. Heft is recovering from an occluded right coronary artery and stent to the LAD. Unfortunately the right coronary artery could not be revascularized. His echo did show preserved LV function however the RV was mildly hypokinetic and dilated and he clinically presented with hypotension and signs of RV infarct. This responded to fluids until his blood pressure recovered. He is now back on dialysis with normal ultrafiltrate. He continues to take aspirin, atorvastatin and Brillinta.  He is also on twice-daily metoprolol.  I think he did well from a cardiac standpoint and would like to see him back in 6 months. I will defer his issues with disability to his primary care provider.  Chrystie Nose, MD, [facility] Attending Cardiologist [facility]  Alexias Margerum C 06/08/2013, 5:54 PM

## 2013-06-16 ENCOUNTER — Telehealth: Payer: Self-pay | Admitting: *Deleted

## 2013-06-16 MED ORDER — PRASUGREL HCL 10 MG PO TABS: 10.0000 mg | ORAL_TABLET | Freq: Every day | ORAL | Status: DC

## 2013-06-16 NOTE — Telephone Encounter (Signed)
Called patient to inform that insurance company would not cover Brilinta 90mg  BID but would cover Effient 10mg  QD  Patient agreeable to medication change.   Rx was sent to pharmacy electronically.

## 2013-08-01 ENCOUNTER — Other Ambulatory Visit: Payer: Self-pay | Admitting: *Deleted

## 2013-08-01 DIAGNOSIS — T82598A Other mechanical complication of other cardiac and vascular devices and implants, initial encounter: Secondary | ICD-10-CM

## 2013-08-07 ENCOUNTER — Encounter (HOSPITAL_COMMUNITY): Payer: Self-pay | Admitting: Emergency Medicine

## 2013-08-07 ENCOUNTER — Inpatient Hospital Stay (HOSPITAL_COMMUNITY)
Admission: EM | Admit: 2013-08-07 | Discharge: 2013-08-08 | DRG: 264 | Disposition: A | Discharge status: Home / Self Care | Payer: Medicare Other | Attending: Vascular Surgery | Admitting: Vascular Surgery

## 2013-08-07 DIAGNOSIS — Z833 Family history of diabetes mellitus: Secondary | ICD-10-CM

## 2013-08-07 DIAGNOSIS — I252 Old myocardial infarction: Secondary | ICD-10-CM

## 2013-08-07 DIAGNOSIS — F17200 Nicotine dependence, unspecified, uncomplicated: Secondary | ICD-10-CM | POA: Diagnosis present

## 2013-08-07 DIAGNOSIS — T8241XA Breakdown (mechanical) of vascular dialysis catheter, initial encounter: Secondary | ICD-10-CM

## 2013-08-07 DIAGNOSIS — L97109 Non-pressure chronic ulcer of unspecified thigh with unspecified severity: Secondary | ICD-10-CM | POA: Diagnosis present

## 2013-08-07 DIAGNOSIS — Z992 Dependence on renal dialysis: Secondary | ICD-10-CM

## 2013-08-07 DIAGNOSIS — Z8249 Family history of ischemic heart disease and other diseases of the circulatory system: Secondary | ICD-10-CM

## 2013-08-07 DIAGNOSIS — Z823 Family history of stroke: Secondary | ICD-10-CM

## 2013-08-07 DIAGNOSIS — E7800 Pure hypercholesterolemia, unspecified: Secondary | ICD-10-CM | POA: Diagnosis present

## 2013-08-07 DIAGNOSIS — I2510 Atherosclerotic heart disease of native coronary artery without angina pectoris: Secondary | ICD-10-CM | POA: Diagnosis present

## 2013-08-07 DIAGNOSIS — G43909 Migraine, unspecified, not intractable, without status migrainosus: Secondary | ICD-10-CM | POA: Diagnosis present

## 2013-08-07 DIAGNOSIS — E875 Hyperkalemia: Secondary | ICD-10-CM | POA: Diagnosis present

## 2013-08-07 DIAGNOSIS — K219 Gastro-esophageal reflux disease without esophagitis: Secondary | ICD-10-CM | POA: Diagnosis present

## 2013-08-07 DIAGNOSIS — Z96649 Presence of unspecified artificial hip joint: Secondary | ICD-10-CM

## 2013-08-07 DIAGNOSIS — N189 Chronic kidney disease, unspecified: Secondary | ICD-10-CM | POA: Diagnosis present

## 2013-08-07 DIAGNOSIS — D649 Anemia, unspecified: Secondary | ICD-10-CM | POA: Diagnosis present

## 2013-08-07 DIAGNOSIS — N186 End stage renal disease: Secondary | ICD-10-CM | POA: Diagnosis present

## 2013-08-07 DIAGNOSIS — Z79899 Other long term (current) drug therapy: Secondary | ICD-10-CM

## 2013-08-07 DIAGNOSIS — G609 Hereditary and idiopathic neuropathy, unspecified: Secondary | ICD-10-CM | POA: Diagnosis present

## 2013-08-07 DIAGNOSIS — T82598A Other mechanical complication of other cardiac and vascular devices and implants, initial encounter: Principal | ICD-10-CM | POA: Diagnosis present

## 2013-08-07 DIAGNOSIS — E119 Type 2 diabetes mellitus without complications: Secondary | ICD-10-CM | POA: Diagnosis present

## 2013-08-07 DIAGNOSIS — F411 Generalized anxiety disorder: Secondary | ICD-10-CM | POA: Diagnosis present

## 2013-08-07 DIAGNOSIS — I509 Heart failure, unspecified: Secondary | ICD-10-CM | POA: Diagnosis present

## 2013-08-07 DIAGNOSIS — I120 Hypertensive chronic kidney disease with stage 5 chronic kidney disease or end stage renal disease: Secondary | ICD-10-CM | POA: Diagnosis present

## 2013-08-07 DIAGNOSIS — Z7982 Long term (current) use of aspirin: Secondary | ICD-10-CM

## 2013-08-07 LAB — CBC
HEMATOCRIT: 39.2 % (ref 39.0–52.0)
Hemoglobin: 12.9 g/dL — ABNORMAL LOW (ref 13.0–17.0)
MCH: 29.7 pg (ref 26.0–34.0)
MCHC: 32.9 g/dL (ref 30.0–36.0)
MCV: 90.3 fL (ref 78.0–100.0)
Platelets: 201 10*3/uL (ref 150–400)
RBC: 4.34 MIL/uL (ref 4.22–5.81)
RDW: 17.3 % — AB (ref 11.5–15.5)
WBC: 12.3 10*3/uL — ABNORMAL HIGH (ref 4.0–10.5)

## 2013-08-07 LAB — MRSA PCR SCREENING: MRSA by PCR: NEGATIVE

## 2013-08-07 LAB — BASIC METABOLIC PANEL
BUN: 78 mg/dL — AB (ref 6–23)
CO2: 23 mEq/L (ref 19–32)
CREATININE: 12.97 mg/dL — AB (ref 0.50–1.35)
Calcium: 8.7 mg/dL (ref 8.4–10.5)
Chloride: 89 mEq/L — ABNORMAL LOW (ref 96–112)
GFR calc Af Amer: 4 mL/min — ABNORMAL LOW (ref >90)
GFR, EST NON AFRICAN AMERICAN: 4 mL/min — AB (ref >90)
Glucose, Bld: 88 mg/dL (ref 70–99)
POTASSIUM: 6.1 meq/L — AB (ref 3.7–5.3)
Sodium: 136 mEq/L — ABNORMAL LOW (ref 137–147)

## 2013-08-07 LAB — PROTIME-INR
INR: 1.06 (ref 0.00–1.49)
Prothrombin Time: 13.6 seconds (ref 11.6–15.2)

## 2013-08-07 MED ORDER — SODIUM CHLORIDE 0.9 % IJ SOLN
3.0000 mL | Freq: Two times a day (BID) | INTRAMUSCULAR | Status: DC
  Administered 2013-08-07: 10 mL via INTRAVENOUS

## 2013-08-07 MED ORDER — DEXTROSE 5 % IV SOLN
1.5000 g | Freq: Once | INTRAVENOUS | Status: AC
  Administered 2013-08-07: 1.5 g via INTRAVENOUS
  Filled 2013-08-07: qty 1.5

## 2013-08-07 MED ORDER — PHENOL 1.4 % MT LIQD
1.0000 | OROMUCOSAL | Status: DC | PRN
Start: 2013-08-07 — End: 2013-08-08
  Filled 2013-08-07: qty 177

## 2013-08-07 MED ORDER — METOPROLOL TARTRATE 1 MG/ML IV SOLN: 2.0000 mg | INTRAVENOUS | Status: DC | PRN

## 2013-08-07 MED ORDER — PANTOPRAZOLE SODIUM 40 MG PO TBEC: 40.0000 mg | DELAYED_RELEASE_TABLET | Freq: Every day | ORAL | Status: DC

## 2013-08-07 MED ORDER — ATORVASTATIN CALCIUM 80 MG PO TABS
80.0000 mg | ORAL_TABLET | Freq: Every day | ORAL | Status: DC
  Filled 2013-08-07 (×2): qty 1

## 2013-08-07 MED ORDER — GUAIFENESIN-DM 100-10 MG/5ML PO SYRP: 15.0000 mL | ORAL_SOLUTION | ORAL | Status: DC | PRN

## 2013-08-07 MED ORDER — CALCIUM CARBONATE ANTACID 500 MG PO CHEW
3.0000 | CHEWABLE_TABLET | Freq: Three times a day (TID) | ORAL | Status: DC
  Filled 2013-08-07 (×5): qty 3

## 2013-08-07 MED ORDER — RENA-VITE PO TABS
1.0000 | ORAL_TABLET | Freq: Every day | ORAL | Status: DC
  Administered 2013-08-07: 1 via ORAL
  Filled 2013-08-07 (×2): qty 1

## 2013-08-07 MED ORDER — VANCOMYCIN HCL 10 G IV SOLR
2000.0000 mg | Freq: Once | INTRAVENOUS | Status: AC
  Administered 2013-08-07: 2000 mg via INTRAVENOUS
  Filled 2013-08-07: qty 2000

## 2013-08-07 MED ORDER — ONDANSETRON HCL 4 MG/2ML IJ SOLN: 4.0000 mg | Freq: Four times a day (QID) | INTRAMUSCULAR | Status: DC | PRN

## 2013-08-07 MED ORDER — ACETAMINOPHEN 325 MG PO TABS: 325.0000 mg | ORAL_TABLET | ORAL | Status: DC | PRN

## 2013-08-07 MED ORDER — LABETALOL HCL 5 MG/ML IV SOLN
10.0000 mg | INTRAVENOUS | Status: DC | PRN
  Filled 2013-08-07: qty 4

## 2013-08-07 MED ORDER — METOPROLOL TARTRATE 25 MG PO TABS
25.0000 mg | ORAL_TABLET | Freq: Two times a day (BID) | ORAL | Status: DC
  Administered 2013-08-07: 25 mg via ORAL
  Filled 2013-08-07 (×3): qty 1

## 2013-08-07 MED ORDER — SODIUM CHLORIDE 0.9 % IV SOLN: 250.0000 mL | INTRAVENOUS | Status: DC | PRN

## 2013-08-07 MED ORDER — POTASSIUM CHLORIDE CRYS ER 20 MEQ PO TBCR: 20.0000 meq | EXTENDED_RELEASE_TABLET | Freq: Once | ORAL | Status: DC

## 2013-08-07 MED ORDER — SODIUM CHLORIDE 0.9 % IJ SOLN: 3.0000 mL | INTRAMUSCULAR | Status: DC | PRN

## 2013-08-07 MED ORDER — HYDRALAZINE HCL 20 MG/ML IJ SOLN
10.0000 mg | INTRAMUSCULAR | Status: DC | PRN
Start: 2013-08-07 — End: 2013-08-08

## 2013-08-07 MED ORDER — OXYCODONE-ACETAMINOPHEN 5-325 MG PO TABS
1.0000 | ORAL_TABLET | ORAL | Status: DC | PRN
  Administered 2013-08-07: 1 via ORAL
  Filled 2013-08-07: qty 1

## 2013-08-07 MED ORDER — ACETAMINOPHEN 325 MG RE SUPP
325.0000 mg | RECTAL | Status: DC | PRN
  Filled 2013-08-07: qty 2

## 2013-08-07 NOTE — ED Notes (Signed)
Nephrologist at bedside

## 2013-08-07 NOTE — Progress Notes (Signed)
   CARE MANAGEMENT ED NOTE 08/07/2013  Patient:  [name], [name]   Account Number:  [id number]  Date Initiated:  08/07/2013  Documentation initiated by:  Ferdinand Cava  Subjective/Objective Assessment:   58 yo male presenting to the [facility] ED with bleeding from LLE HD access device. CM consult for resouces with assist in home     Subjective/Objective Assessment Detail:     Action/Plan:   Action/Plan Detail:   Anticipated DC Date:       Status Recommendation to Physician:   Result of Recommendation:      DC Planning Services  CM consult  Other    Choice offered to / List presented to:  C-1 Patient          Status of service:    ED Comments:   ED Comments Detail:    Patient requesting assist in the home with cooking and cleaning. He stated that he has no energy after his dialysis treatments. Provided private sitter resource list although patient stated that is unable to afford this. Also discussed on the resouce list community churches and [employer] that can he can call to see if they have volunteers to help with these services. Provided the number for DSS to follow up with Medicaid application. Discussed other services if appropriate and patient stated he does not need anything else but was appreciative of information.

## 2013-08-07 NOTE — ED Provider Notes (Signed)
CSN: [id number]     Arrival date & time 08/07/13  0815 History   First MD Initiated Contact with Patient 08/07/13 0818     Chief Complaint  Patient presents with  . Vascular Access Problem    (Consider location/radiation/quality/duration/timing/severity/associated sxs/prior Treatment) HPI 58 year old man with history of ESRD on HD MTWF, DM2, HTN, CHF, hep B who presents from dialysis due to bleeding lesion near LLE HD access site.     Patient states he developed a lesion on his left upper leg about 2 weeks ago.  He pointed this out to Dr. Briant Cedar last week who "stuck a needle in it to be sure there wasn't any infection."  Patient completed HD session last Friday.  States that the lesion "busted" on Saturday and has been bleeding more ever since.  When he went to HD today, he was not dialyzed and sent to ED due to lesion. No fever, no pus, no other symptoms.   He has had similar lesion in past near his left upper extremity HD site previously.   Past Medical History  Diagnosis Date  . Anxiety   . Back pain   . GERD (gastroesophageal reflux disease)   . Peripheral neuropathy   . Arthritis   . CHF (congestive heart failure)   . Active smoker   . ESRD on hemodialysis     [name] 4 days per week on M-Tu-Wed and Fri.  Started HD in 1998 and has been on HD initially at [facility], then went to [facility], then in [facility], then to [facility] and now is at [facility] for last 10 years.  Has L thigh AVG.     . Diabetes mellitus     diet controlled  . 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 PONV (postoperative nausea and vomiting)   . Hypertension   . Bell palsy   . Heart murmur   . Carpal tunnel syndrome     bilateral  . Dysrhythmia     Hx: of palpitataions  . Headache(784.0)     Hx: of Migraines   Past Surgical History  Procedure Laterality Date  . Total hip arthroplasty    . Thyroidectomy    . Tooth extraction    . Mandible fracture surgery    . Dg av  dialysis graft declot or    . Insertion of dialysis catheter  03/28/2011    Procedure: INSERTION OF DIALYSIS CATHETER;  Surgeon: Pryor Ochoa, MD;  Location: [facility] OR;  Service: Vascular;  Laterality: Left;  . Av fistula placement  03/31/2011    Procedure: INSERTION OF ARTERIOVENOUS (AV) GORE-TEX GRAFT THIGH;  Surgeon: Sherren Kerns, MD;  Location: [facility] OR;  Service: Vascular;  Laterality: Right;  redo right thigh arteriovenous gortex graft using gore-tex stretch 56mm x 70cm  . Thrombectomy w/ embolectomy  06/02/2011    Procedure: THROMBECTOMY ARTERIOVENOUS GORE-TEX GRAFT;  Surgeon: Chuck Hint, MD;  Location: [facility] OR;  Service: Vascular;  Laterality: Right;  Thrombectomy right thigh arteriovenous gortex graft;  revision by  replacement of large portion of graft with 60mm gore-tex   . Insertion of dialysis catheter  08/28/2011    Procedure: INSERTION OF DIALYSIS CATHETER;  Surgeon: Fransisco Hertz, MD;  Location: [facility] OR;  Service: Vascular;  Laterality: Left;  Atempted Bilateral Internal Jugular, Bilateral Subclavin insertion of 55cm Dialysis Catheter Left Femoral  . Insertion of dialysis catheter  12/15/2011  Procedure: INSERTION OF DIALYSIS CATHETER;  Surgeon: Sherren Kernsharles E Fields, MD;  [facility];  Service: Vascular;  Laterality: Right;  Insertion of Right Femoral Dialysis Catheter  . Exchange of a dialysis catheter  02/26/2012    Procedure: EXCHANGE OF A DIALYSIS CATHETER;  Surgeon: Larina Earthlyodd F Early, MD;  [facility];  Service: Vascular;  Laterality: Right;  . Joint replacement    . Exchange of a dialysis catheter  05/18/2012    Procedure: EXCHANGE OF A DIALYSIS CATHETER;  Surgeon: Larina Earthlyodd F Early, MD;  [facility];  Service: Vascular;  Laterality: Right;  right femoral dialysis catheter  . Av fistula placement  05/18/2012    Procedure: INSERTION OF ARTERIOVENOUS (AV) GORE-TEX GRAFT THIGH;  Surgeon: Larina Earthlyodd F Early, MD;  [facility];  Service: Vascular;  Laterality: Left;  using 6mm by 70cm goretex  graft  . Thrombectomy w/ embolectomy Left 08/25/2012    Procedure: THROMBECTOMY ARTERIOVENOUS GORE-TEX GRAFT;  Surgeon: Pryor OchoaJames D Lawson, MD;  [facility];  Service: Vascular;  Laterality: Left;  Attempted thrombectomy of left thigh arteriovenous gortex graft.   . Av fistula placement Left 08/25/2012    Procedure: INSERTION OF ARTERIOVENOUS (AV) GORE-TEX GRAFT THIGH;  Surgeon: Pryor OchoaJames D Lawson, MD;  [facility];  Service: Vascular;  Laterality: Left;  Using 6mm x 40cm vascular Gortex graft.  . Revision of arteriovenous goretex graft Left 12/14/2012    Procedure: Revision of Left Thigh Graft;  Surgeon: Larina Earthlyodd F Early, MD;  [facility];  Service: Vascular;  Laterality: Left;  . Avgg removal Left 02/16/2013    Procedure: EXCISION OF LEFT ARM ARTERIOVENOUS GORETEX GRAFT TIMES 2 WITH VEIN PATCH ANGIOPLASTY OF BRACIAL ARTERY.  ;  Surgeon: Chuck Hinthristopher S Dickson, MD;  Location: [facility] OR;  Service: Vascular;  Laterality: Left;  Converted from MAC to General.     Family History  Problem Relation Age of Onset  . Diabetes Mother   . Stroke Mother   . Heart disease Mother   . Kidney disease Father   . Hyperlipidemia Father    History  Substance Use Topics  . Smoking status: Current Every Day Smoker -- 1.00 packs/day for 40 years    Types: Cigarettes  . Smokeless tobacco: Never Used     Comment: pt states that he wants to quit "1 cigarette every now and then" - 06/08/13  . Alcohol Use: No    Review of Systems  Constitutional: Positive for fatigue. Negative for fever.       Fatigue since stent x2 placement 1 month ago  Respiratory: Negative for shortness of breath.   Cardiovascular: Negative for chest pain.  Gastrointestinal: Negative for nausea, abdominal pain and diarrhea.  Skin: Positive for wound.  Neurological: Negative for dizziness and headaches.    Allergies  Review of patient's allergies indicates no known allergies.  Home Medications   Current Outpatient Rx  Name  Route  Sig   Dispense  Refill  . aspirin 81 MG tablet   Oral   Take 1 tablet (81 mg total) by mouth daily.         Marland Kitchen. atorvastatin (LIPITOR) 80 MG tablet   Oral   Take 1 tablet (80 mg total) by mouth daily at 6 PM.   30 tablet   11   . b complex-vitamin c-folic acid (NEPHRO-VITE) 0.8 MG TABS   Oral   Take 0.8 mg by mouth at bedtime.          . calcium carbonate (  TUMS - DOSED IN MG ELEMENTAL CALCIUM) 500 MG chewable tablet   Oral   Chew 3 tablets by mouth 3 (three) times daily.         . colchicine 0.6 MG tablet   Oral   Take 0.6 mg by mouth 2 (two) times daily as needed. For gout         . cyclobenzaprine (FLEXERIL) 5 MG tablet   Oral   Take 1 tablet (5 mg total) by mouth 2 (two) times daily as needed for muscle spasms.   20 tablet   0   . esomeprazole (NEXIUM) 40 MG capsule   Oral   Take 40 mg by mouth every evening.          . metoprolol tartrate (LOPRESSOR) 25 MG tablet   Oral   Take 1 tablet (25 mg total) by mouth 2 (two) times daily.   60 tablet   11   . nitroGLYCERIN (NITROSTAT) 0.4 MG SL tablet   Sublingual   Place 1 tablet (0.4 mg total) under the tongue every 5 (five) minutes as needed for chest pain.   25 tablet   3   . oxyCODONE (OXY IR/ROXICODONE) 5 MG immediate release tablet   Oral   Take 1 tablet by mouth 3 (three) times daily as needed for moderate pain.          . prasugrel (EFFIENT) 10 MG TABS tablet   Oral   Take 1 tablet (10 mg total) by mouth daily.   30 tablet   6     To be taken in place of Brilinta   . sodium bicarbonate 650 MG tablet   Oral   Take 650 mg by mouth 2 (two) times daily.           . sulindac (CLINORIL) 200 MG tablet   Oral   Take 200 mg by mouth 2 (two) times daily.         . traZODone (DESYREL) 50 MG tablet   Oral   Take 50 mg by mouth at bedtime as needed for sleep.           BP 115/68  Pulse 67  Temp(Src) 98.1 F (36.7 C) (Oral)  Resp 19  Ht 5\' 11"  (1.803 m)  Wt 283 lb (128.368 kg)  BMI 39.49  kg/m2  SpO2 97% Physical Exam  Constitutional: He is oriented to person, place, and time. He appears well-developed and well-nourished. No distress.  HENT:  Head: Normocephalic and atraumatic.  Eyes: Pupils are equal, round, and reactive to light.  Neck: Normal range of motion. Neck supple.  Cardiovascular: Normal rate and regular rhythm.   Pulmonary/Chest: Effort normal and breath sounds normal. He has no wheezes. He has no rales.  Abdominal: Soft. Bowel sounds are normal.  Musculoskeletal: Normal range of motion.  Neurological: He is alert and oriented to person, place, and time.  Skin: Skin is warm and dry. Lesion noted. He is not diaphoretic.     1 cm lesion on proximal left leg medial to graft site, bleeding but no pus, no warmth or surrounding erythema appreciated. Firm/?+induration, no fluctuance  + thrill over graft site  ED Course  Procedures (including critical care time) Labs Review Labs Reviewed  CBC - Abnormal; Notable for the following:    WBC 12.3 (*)    Hemoglobin 12.9 (*)    RDW 17.3 (*)    All other components within normal limits  BASIC METABOLIC PANEL - Abnormal; Notable for the  following:    Sodium 136 (*)    Potassium 6.1 (*)    Chloride 89 (*)    BUN 78 (*)    Creatinine, Ser 12.97 (*)    GFR calc non Af Amer 4 (*)    GFR calc Af Amer 4 (*)    All other components within normal limits   Imaging Review No results found.   EKG Interpretation None      MDM   Final diagnoses:  Hemodialysis graft malfunction  Proximal left leg lesion near HD access site.  Afebrile, no tachycardia or tachypnea.  Does not appear infected.  Concern for ?hematoma formation thus will obtain ultrasound to further characterize.  Note: Patient did not attend HD this morning due to lesion.   K 6.1, EKG pending.  Ultrasound showed good flow through graft with likely seroma vs. hematoma adjacent to but not in communication with graft.   Consult to vascular surgery  regarding ability to use graft vs. need for revision.  Also spoke to [facility], RN did not attempt to access site this morning as she was uncomfortable given bleeding.   Patient to be admitted to vascular surgery service for revision of medial segment of graft.   Rocco Serene, MD 08/07/13 [phone]

## 2013-08-07 NOTE — ED Notes (Signed)
Onset 3 days ago Doctor assess left thigh near dialysis access tried to removed puss however blood only was drained. States intermittently bleeding and changed bandage multiple times last being prior to arrival. Bandage intact.

## 2013-08-07 NOTE — ED Provider Notes (Signed)
  This was a shared visit with a resident physician.  Throughout the patient's course I was available for consultation/collaboration.  I saw the ECG (rate 66, sinus rhythm, low voltage, unremarkable T waves abnormal), relevant labs and studies - I agree with the interpretation.  Most prominently, the patient has hyperkalemia, and was due for dialysis today.  On my exam the patient was in no distress.  With the lesion the adjacent to his graft we performed ultrasound, labs.  Subsequently we discussed case with our vascular surgery team.  Patient was admitted for dialysis given his potassium of 6, and revision of his graft.      Gerhard Munch, MD 08/07/13 [phone]

## 2013-08-07 NOTE — Consult Note (Signed)
[name] is an 58 y.o. male referred by Dr Darrick Penna  Chief Complaint: ESRD, hyperkalemia HPI: [name] with ESRD sent from HD unit to ER for ulceration close to his Lt thigh AVG.  Pt had area of fluctuance that I tried to aspirate last week but no return of pus.  He was to have appt with [facility].  On sat he says the fluctuant area "opened up" and only blood drained, no pus.  No F/C/S  Past Medical History  Diagnosis Date  . Anxiety   . Back pain   . GERD (gastroesophageal reflux disease)   . Peripheral neuropathy   . Arthritis   . CHF (congestive heart failure)   . Active smoker   . ESRD on hemodialysis     [name] 4 days per week on M-Tu-Wed and Fri.  Started HD in 1998 and has been on HD initially at [facility], then went to [facility], then in [facility], then to [facility] and now is at [facility] for last 10 years.  Has L thigh AVG.     . Diabetes mellitus     diet controlled  . 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 PONV (postoperative nausea and vomiting)   . Hypertension   . Bell palsy   . Heart murmur   . Carpal tunnel syndrome     bilateral  . Dysrhythmia     Hx: of palpitataions  . Headache(784.0)     Hx: of Migraines    Past Surgical History  Procedure Laterality Date  . Total hip arthroplasty    . Thyroidectomy    . Tooth extraction    . Mandible fracture surgery    . Dg av dialysis graft declot or    . Insertion of dialysis catheter  03/28/2011    Procedure: INSERTION OF DIALYSIS CATHETER;  Surgeon: Pryor Ochoa, MD;  Location: [facility] OR;  Service: Vascular;  Laterality: Left;  . Av fistula placement  03/31/2011    Procedure: INSERTION OF ARTERIOVENOUS (AV) GORE-TEX GRAFT THIGH;  Surgeon: Sherren Kerns, MD;  Location: [facility] OR;  Service: Vascular;  Laterality: Right;  redo right thigh arteriovenous gortex graft using gore-tex stretch 53mm x 70cm  . Thrombectomy w/ embolectomy  06/02/2011    Procedure: THROMBECTOMY ARTERIOVENOUS GORE-TEX GRAFT;  Surgeon:  Chuck Hint, MD;  Location: [facility] OR;  Service: Vascular;  Laterality: Right;  Thrombectomy right thigh arteriovenous gortex graft;  revision by  replacement of large portion of graft with 4mm gore-tex   . Insertion of dialysis catheter  08/28/2011    Procedure: INSERTION OF DIALYSIS CATHETER;  Surgeon: Fransisco Hertz, MD;  Location: [facility] OR;  Service: Vascular;  Laterality: Left;  Atempted Bilateral Internal Jugular, Bilateral Subclavin insertion of 55cm Dialysis Catheter Left Femoral  . Insertion of dialysis catheter  12/15/2011    Procedure: INSERTION OF DIALYSIS CATHETER;  Surgeon: Sherren Kerns, MD;  Location: [facility] OR;  Service: Vascular;  Laterality: Right;  Insertion of Right Femoral Dialysis Catheter  . Exchange of a dialysis catheter  02/26/2012    Procedure: EXCHANGE OF A DIALYSIS CATHETER;  Surgeon: Larina Earthly, MD;  Location: [facility] OR;  Service: Vascular;  Laterality: Right;  . Joint replacement    . Exchange of a dialysis catheter  05/18/2012    Procedure: EXCHANGE OF A DIALYSIS CATHETER;  Surgeon: Larina Earthly, MD;  Location: [facility] OR;  Service: Vascular;  Laterality: Right;  right femoral dialysis catheter  . Av fistula placement  05/18/2012    Procedure: INSERTION OF ARTERIOVENOUS (AV) GORE-TEX GRAFT THIGH;  Surgeon: Larina Earthlyodd F Early, MD;  [facility];  Service: Vascular;  Laterality: Left;  using 6mm by 70cm goretex graft  . Thrombectomy w/ embolectomy Left 08/25/2012    Procedure: THROMBECTOMY ARTERIOVENOUS GORE-TEX GRAFT;  Surgeon: Pryor OchoaJames D Lawson, MD;  [facility];  Service: Vascular;  Laterality: Left;  Attempted thrombectomy of left thigh arteriovenous gortex graft.   . Av fistula placement Left 08/25/2012    Procedure: INSERTION OF ARTERIOVENOUS (AV) GORE-TEX GRAFT THIGH;  Surgeon: Pryor OchoaJames D Lawson, MD;  [facility];  Service: Vascular;  Laterality: Left;  Using 6mm x 40cm vascular Gortex graft.  . Revision of arteriovenous goretex graft Left 12/14/2012    Procedure: Revision of Left  Thigh Graft;  Surgeon: Larina Earthlyodd F Early, MD;  [facility];  Service: Vascular;  Laterality: Left;  . Avgg removal Left 02/16/2013    Procedure: EXCISION OF LEFT ARM ARTERIOVENOUS GORETEX GRAFT TIMES 2 WITH VEIN PATCH ANGIOPLASTY OF BRACIAL ARTERY.  ;  Surgeon: Chuck Hinthristopher S Dickson, MD;  [facility]: [facility] OR;  Service: Vascular;  Laterality: Left;  Converted from MAC to General.      Family History  Problem Relation Age of Onset  . Diabetes Mother   . Stroke Mother   . Heart disease Mother   . Kidney disease Father   . Hyperlipidemia Father    Social History:  reports that he has been smoking Cigarettes.  He has a 40 pack-year smoking history. He has never used smokeless tobacco. He reports that he does not drink alcohol or use illicit drugs.  Allergies: No Known Allergies   (Not in a hospital admission)   Lab Results: UA: ND   Recent Labs  08/07/13 0912  WBC 12.3*  HGB 12.9*  HCT 39.2  PLT 201   BMET  Recent Labs  08/07/13 0912  NA 136*  K 6.1*  CL 89*  CO2 23  GLUCOSE 88  BUN 78*  CREATININE 12.97*  CALCIUM 8.7   LFT No results found for this basename: PROT, ALBUMIN, AST, ALT, ALKPHOS, BILITOT, BILIDIR, IBILI,  in the last 72 hours No results found.  ROS: No change in vision No CP No SOB No abd pain, no change in bowels Chronic back pain, on oxycodone No dysuria No new neuropathic sxs  PHYSICAL EXAM: Blood pressure 139/74, pulse 68, temperature 98.1 F (36.7 C), temperature source Oral, resp. rate 18, height 5\' 11"  (1.803 m), weight 128.368 kg (283 lb), SpO2 98.00%. HEENT: PERRLA EOMI NECK:no JVD LUNGS:clear CARDIAC:RRR wo MRG ABD:+ BS NTND No HSM EXT:No edema  Lt thigh AVG + bruit.  There is a 2cmx2cm open ulceration close to the lower medial side of the AVG NEURO:CNI Ox3 no asterixis  Assessment: 1. Possible infected AVG 2. ESRD 3. HTN 4. CAD 5. hyperkalemia PLAN: 1. Plan HD tonight.  Will use only lateral limb of AVG 2. Note plans for  surgery in AM 3. Start empiric AB with vanco tonight 4. Home meds ordered 5. Renal diet   Dyke MaesMichael T Nolawi Kanady 08/07/2013, 1:43 PM

## 2013-08-07 NOTE — ED Notes (Signed)
Pt not in room.

## 2013-08-07 NOTE — ED Notes (Signed)
Vascular team at bedside

## 2013-08-07 NOTE — ED Notes (Signed)
Patient not in room

## 2013-08-07 NOTE — Progress Notes (Signed)
CSW consult to pt regarding questions surrounding home care services. Pt has Medicare A, B and D. CSW shared with pt that Medicare will cover skilled nursing visits and not custodial care or home makers services. Pt will be given a list of agencies that will do so but will be private pay. Pt shared that he goes to dialysis on Mon., Tues, Wed and Fri. Pt was receiving home health services for PT but stated the therapy was too much for him. Pt has been given a Medicaid application to assist for co-payments of medication.   [address], [place] [phone]

## 2013-08-07 NOTE — Consult Note (Signed)
[facility] NOTE   MRN : [id number]  Reason for Consult: Left thigh graft bleeding Referring Physician: ED  History of Present Illness: Patient was seen at dialysis clinic this morning and they were worried about the graft bleeding and had him sent to the ED.  He does have elevated WBC, no temp and reports no fever or chills.  He has a history of multiple dialysis access problems.  His past medical history includes hypercholesterolemia, CAD, and hypertension.  He is on Lipitor, metoprolol, and nitrostat prn.     No current facility-administered medications for this encounter.   Current Outpatient Prescriptions  Medication Sig Dispense Refill  . aspirin 81 MG tablet Take 1 tablet (81 mg total) by mouth daily.      Marland Kitchen atorvastatin (LIPITOR) 80 MG tablet Take 1 tablet (80 mg total) by mouth daily at 6 PM.  30 tablet  11  . b complex-vitamin c-folic acid (NEPHRO-VITE) 0.8 MG TABS Take 0.8 mg by mouth at bedtime.       . calcium carbonate (TUMS - DOSED IN MG ELEMENTAL CALCIUM) 500 MG chewable tablet Chew 3 tablets by mouth 3 (three) times daily.      . colchicine 0.6 MG tablet Take 0.6 mg by mouth 2 (two) times daily as needed. For gout      . cyclobenzaprine (FLEXERIL) 5 MG tablet Take 1 tablet (5 mg total) by mouth 2 (two) times daily as needed for muscle spasms.  20 tablet  0  . esomeprazole (NEXIUM) 40 MG capsule Take 40 mg by mouth every evening.       . metoprolol tartrate (LOPRESSOR) 25 MG tablet Take 1 tablet (25 mg total) by mouth 2 (two) times daily.  60 tablet  11  . nitroGLYCERIN (NITROSTAT) 0.4 MG SL tablet Place 1 tablet (0.4 mg total) under the tongue every 5 (five) minutes as needed for chest pain.  25 tablet  3  . oxyCODONE (OXY IR/ROXICODONE) 5 MG immediate release tablet Take 1 tablet by mouth 3 (three) times daily as needed for moderate pain.       . prasugrel (EFFIENT) 10 MG TABS tablet Take 1 tablet (10 mg total) by mouth daily.  30  tablet  6  . sodium bicarbonate 650 MG tablet Take 650 mg by mouth 2 (two) times daily.        . sulindac (CLINORIL) 200 MG tablet Take 200 mg by mouth 2 (two) times daily.      . traZODone (DESYREL) 50 MG tablet Take 50 mg by mouth at bedtime as needed for sleep.         Pt meds include: Statin :Yes Betablocker: Yes ASA: Yes Other anticoagulants/antiplatelets:   Past Medical History  Diagnosis Date  . Anxiety   . Back pain   . GERD (gastroesophageal reflux disease)   . Peripheral neuropathy   . Arthritis   . CHF (congestive heart failure)   . Active smoker   . ESRD on hemodialysis     [name] 4 days per week on M-Tu-Wed and Fri.  Started HD in 1998 and has been on HD initially at [facility], then went to [facility], then in [facility], then to [facility] and now is at [facility] for last 10 years.  Has L thigh AVG.     . Diabetes mellitus     diet controlled  . Hepatitis 2010    pt states hx of hep B 3  yrs ago  . PONV (postoperative nausea and vomiting)   . Hypertension   . Bell palsy   . Heart murmur   . Carpal tunnel syndrome     bilateral  . Dysrhythmia     Hx: of palpitataions  . Headache(784.0)     Hx: of Migraines    Past Surgical History  Procedure Laterality Date  . Total hip arthroplasty    . Thyroidectomy    . Tooth extraction    . Mandible fracture surgery    . Dg av dialysis graft declot or    . Insertion of dialysis catheter  03/28/2011    Procedure: INSERTION OF DIALYSIS CATHETER;  Surgeon: Pryor OchoaJames D Lawson, MD;  [facility];  Service: Vascular;  Laterality: Left;  . Av fistula placement  03/31/2011    Procedure: INSERTION OF ARTERIOVENOUS (AV) GORE-TEX GRAFT THIGH;  Surgeon: Sherren Kernsharles E Fields, MD;  Location: [facility] OR;  Service: Vascular;  Laterality: Right;  redo right thigh arteriovenous gortex graft using gore-tex stretch 6mm x 70cm  . Thrombectomy w/ embolectomy  06/02/2011    Procedure: THROMBECTOMY ARTERIOVENOUS GORE-TEX GRAFT;  Surgeon: Chuck Hinthristopher  S Dickson, MD;  [facility];  Service: Vascular;  Laterality: Right;  Thrombectomy right thigh arteriovenous gortex graft;  revision by  replacement of large portion of graft with 7mm gore-tex   . Insertion of dialysis catheter  08/28/2011    Procedure: INSERTION OF DIALYSIS CATHETER;  Surgeon: Fransisco HertzBrian L Chen, MD;  [facility];  Service: Vascular;  Laterality: Left;  Atempted Bilateral Internal Jugular, Bilateral Subclavin insertion of 55cm Dialysis Catheter Left Femoral  . Insertion of dialysis catheter  12/15/2011    Procedure: INSERTION OF DIALYSIS CATHETER;  Surgeon: Sherren Kernsharles E Fields, MD;  [facility];  Service: Vascular;  Laterality: Right;  Insertion of Right Femoral Dialysis Catheter  . Exchange of a dialysis catheter  02/26/2012    Procedure: EXCHANGE OF A DIALYSIS CATHETER;  Surgeon: Larina Earthlyodd F Early, MD;  [facility];  Service: Vascular;  Laterality: Right;  . Joint replacement    . Exchange of a dialysis catheter  05/18/2012    Procedure: EXCHANGE OF A DIALYSIS CATHETER;  Surgeon: Larina Earthlyodd F Early, MD;  [facility];  Service: Vascular;  Laterality: Right;  right femoral dialysis catheter  . Av fistula placement  05/18/2012    Procedure: INSERTION OF ARTERIOVENOUS (AV) GORE-TEX GRAFT THIGH;  Surgeon: Larina Earthlyodd F Early, MD;  [facility];  Service: Vascular;  Laterality: Left;  using 6mm by 70cm goretex graft  . Thrombectomy w/ embolectomy Left 08/25/2012    Procedure: THROMBECTOMY ARTERIOVENOUS GORE-TEX GRAFT;  Surgeon: Pryor OchoaJames D Lawson, MD;  [facility];  Service: Vascular;  Laterality: Left;  Attempted thrombectomy of left thigh arteriovenous gortex graft.   . Av fistula placement Left 08/25/2012    Procedure: INSERTION OF ARTERIOVENOUS (AV) GORE-TEX GRAFT THIGH;  Surgeon: Pryor OchoaJames D Lawson, MD;  [facility];  Service: Vascular;  Laterality: Left;  Using 6mm x 40cm vascular Gortex graft.  . Revision of arteriovenous goretex graft Left 12/14/2012    Procedure: Revision of Left Thigh  Graft;  Surgeon: Larina Earthlyodd F Early, MD;  [facility];  Service: Vascular;  Laterality: Left;  . Avgg removal Left 02/16/2013    Procedure: EXCISION OF LEFT ARM ARTERIOVENOUS GORETEX GRAFT TIMES 2 WITH VEIN PATCH ANGIOPLASTY OF BRACIAL ARTERY.  ;  Surgeon: Chuck Hinthristopher S Dickson, MD;  [facility];  Service: Vascular;  Laterality: Left;  Converted  from MAC to General.      Social History History  Substance Use Topics  . Smoking status: Current Every Day Smoker -- 1.00 packs/day for 40 years    Types: Cigarettes  . Smokeless tobacco: Never Used     Comment: pt states that he wants to quit "1 cigarette every now and then" - 06/08/13  . Alcohol Use: No    Family History Family History  Problem Relation Age of Onset  . Diabetes Mother   . Stroke Mother   . Heart disease Mother   . Kidney disease Father   . Hyperlipidemia Father     No Known Allergies   REVIEW OF SYSTEMS  General: [ ]  Weight loss, [ ]  Fever, [ ]  chills Neurologic: [ ]  Dizziness, [ ]  Blackouts, [ ]  Seizure [ ]  Stroke, [ ]  "Mini stroke", [ ]  Slurred speech, [ ]  Temporary blindness; [ ]  weakness in arms or legs, [ ]  Hoarseness [ ]  Dysphagia Cardiac: [ ]  Chest pain/pressure, [ ]  Shortness of breath at rest [ ]  Shortness of breath with exertion, [ ]  Atrial fibrillation or irregular heartbeat  Vascular: [ ]  Pain in legs with walking, [ ]  Pain in legs at rest, [ ]  Pain in legs at night,  [ ]  Non-healing ulcer, [ ]  Blood clot in vein/DVT,   Pulmonary: [ ]  Home oxygen, [ ]  Productive cough, [ ]  Coughing up blood, [ ]  Asthma,  [ ]  Wheezing [ ]  COPD Musculoskeletal:  [ ]  Arthritis, [ ]  Low back pain, [ ]  Joint pain Hematologic: [ ]  Easy Bruising, [x ] Anemia; [ ]  Hepatitis Gastrointestinal: [ ]  Blood in stool, [ ]  Gastroesophageal Reflux/heartburn, Urinary: [x ] chronic Kidney disease, [ ]  on HD - [x ] MWF or [ ]  TTHS, [ ]  Burning with urination, [ ]  Difficulty urinating Skin: [ ]  Rashes, [x ] Wounds Psychological: [ ]   Anxiety, [ ]  Depression  Physical Examination Filed Vitals:   08/07/13 0826  BP: 106/74  Pulse: 66  Temp: 98.1 F (36.7 C)  TempSrc: Oral  Resp: 18  Height: 5\' 11"  (1.803 m)  Weight: 283 lb (128.368 kg)  SpO2: 97%   Body mass index is 39.49 kg/(m^2).  General:  WDWN in NAD Gait: Normal HENT: WNL Eyes: Pupils equal Pulmonary: normal non-labored breathing , without Rales, rhonchi,  wheezing Cardiac: RRR, without  Murmurs, rubs or gallops; No carotid bruits Abdomen: soft, NT, no masses Skin: no rashes, ulcers noted;  no Gangrene , no cellulitis; pos open wounds over left AV graft stick site.  Vascular Exam/Pulses:palpable thrill in left thigh AV graft.     Musculoskeletal: no muscle wasting or atrophy; no edema  Neurologic: A&O X 3; Appropriate Affect ;  SENSATION: normal; MOTOR FUNCTION: 5/5 Symmetric Speech is fluent/normal   Significant Diagnostic Studies: CBC Lab Results  Component Value Date   WBC 12.3* 08/07/2013   HGB 12.9* 08/07/2013   HCT 39.2 08/07/2013   MCV 90.3 08/07/2013   PLT 201 08/07/2013    BMET    Component Value Date/Time   NA 136* 08/07/2013 0912   K 6.1* 08/07/2013 0912   CL 89* 08/07/2013 0912   CO2 23 08/07/2013 0912   GLUCOSE 88 08/07/2013 0912   BUN 78* 08/07/2013 0912   CREATININE 12.97* 08/07/2013 0912   CALCIUM 8.7 08/07/2013 0912   GFRNONAA 4* 08/07/2013 0912   GFRAA 4* 08/07/2013 0912   Estimated Creatinine Clearance: 8.5 ml/min (by C-G formula based on Cr of 12.97).  COAG Lab Results  Component Value Date   INR 1.10 08/28/2011   INR 1.08 03/31/2011   INR 0.91 06/09/2010     Non-Invasive Vascular Imaging:  Ultrasound of the graft was performed results pending  ASSESSMENT/PLAN:   Left thigh av graft bleeding from stick site with ulcer.  OR tomorrow for medial graft revision left thigh  Plan: admit to the hospital today, consult Nephrology for dialysis needs  He normally dialysis M-W-F.  I spoke with Dr. Lauree Chandler Narda Amber 08/07/2013 11:09 AM  History and exam details as above Ulcerated medial segment of graft currently not bleeding but very high risk Will admit today due to high risk of bleeding with plan to revise graft tomorrow  Fabienne Bruns, MD [facility] [place] Office: [phone] Pager: [phone]

## 2013-08-07 NOTE — ED Notes (Signed)
Called dialysis ready for patient.

## 2013-08-07 NOTE — Procedures (Signed)
Pt seen at HD.  Will use 1K bath x then 2K.  Using lateral limb of AVG.  Ap 140 Vp 210

## 2013-08-07 NOTE — Progress Notes (Signed)
*  PRELIMINARY RESULTS* Vascular Ultrasound Duplex Dialysis Access (AVF, AGV) has been completed.  Femoral to greater saphenous loop graft is patent. There is a 3.8cm simple anechoic lesion of the groin in the region of the outflow segment of the graft, which does not appear to connect with the graft. This is suggestive of a possible seroma, versus hematoma, versus unknown etiology. There is perigraft fluid visualized in the proximal to mid graft, in the region of the patient's wound.  Preliminary results discussed with Dr.Lockwood and Lianne Cure, PA.  08/07/2013 11:12 AM Gertie Fey, RVT, RDCS, RDMS

## 2013-08-08 ENCOUNTER — Encounter (HOSPITAL_COMMUNITY): Payer: Self-pay | Admitting: Certified Registered Nurse Anesthetist

## 2013-08-08 ENCOUNTER — Encounter (HOSPITAL_COMMUNITY)
Admission: EM | Disposition: A | Discharge status: Home / Self Care | Payer: Self-pay | Source: Home / Self Care | Attending: Vascular Surgery

## 2013-08-08 ENCOUNTER — Inpatient Hospital Stay (HOSPITAL_COMMUNITY): Payer: Medicare Other | Admitting: Certified Registered Nurse Anesthetist

## 2013-08-08 ENCOUNTER — Encounter (HOSPITAL_COMMUNITY): Payer: Medicare Other | Admitting: Certified Registered Nurse Anesthetist

## 2013-08-08 DIAGNOSIS — T82898A Other specified complication of vascular prosthetic devices, implants and grafts, initial encounter: Secondary | ICD-10-CM

## 2013-08-08 HISTORY — PX: REVISION OF ARTERIOVENOUS GORETEX GRAFT: SHX6073

## 2013-08-08 LAB — GLUCOSE, CAPILLARY: Glucose-Capillary: 101 mg/dL — ABNORMAL HIGH (ref 70–99)

## 2013-08-08 LAB — RENAL FUNCTION PANEL
Albumin: 2.9 g/dL — ABNORMAL LOW (ref 3.5–5.2)
BUN: 54 mg/dL — ABNORMAL HIGH (ref 6–23)
CALCIUM: 8.6 mg/dL (ref 8.4–10.5)
CO2: 22 mEq/L (ref 19–32)
Chloride: 92 mEq/L — ABNORMAL LOW (ref 96–112)
Creatinine, Ser: 10.18 mg/dL — ABNORMAL HIGH (ref 0.50–1.35)
GFR calc Af Amer: 6 mL/min — ABNORMAL LOW (ref >90)
GFR calc non Af Amer: 5 mL/min — ABNORMAL LOW (ref >90)
GLUCOSE: 96 mg/dL (ref 70–99)
Phosphorus: 8.4 mg/dL — ABNORMAL HIGH (ref 2.3–4.6)
Potassium: 5.1 mEq/L (ref 3.7–5.3)
Sodium: 136 mEq/L — ABNORMAL LOW (ref 137–147)

## 2013-08-08 LAB — SURGICAL PCR SCREEN
MRSA, PCR: NEGATIVE
STAPHYLOCOCCUS AUREUS: NEGATIVE

## 2013-08-08 SURGERY — REVISION OF ARTERIOVENOUS GORETEX GRAFT
Anesthesia: General | Site: Leg Upper | Laterality: Left

## 2013-08-08 MED ORDER — THROMBIN 20000 UNITS EX SOLR
CUTANEOUS | Status: AC
  Filled 2013-08-08: qty 20000

## 2013-08-08 MED ORDER — SODIUM CHLORIDE 0.9 % IJ SOLN
INTRAMUSCULAR | Status: AC
  Filled 2013-08-08: qty 10

## 2013-08-08 MED ORDER — HYDROMORPHONE HCL PF 1 MG/ML IJ SOLN
0.2500 mg | INTRAMUSCULAR | Status: DC | PRN
  Administered 2013-08-08: 0.5 mg via INTRAVENOUS

## 2013-08-08 MED ORDER — SODIUM CHLORIDE 0.9 % IR SOLN
Status: DC | PRN
  Administered 2013-08-08: 09:00:00

## 2013-08-08 MED ORDER — FENTANYL CITRATE 0.05 MG/ML IJ SOLN
INTRAMUSCULAR | Status: DC | PRN
  Administered 2013-08-08 (×7): 50 ug via INTRAVENOUS

## 2013-08-08 MED ORDER — SODIUM CHLORIDE 0.9 % IV SOLN
INTRAVENOUS | Status: DC | PRN
  Administered 2013-08-08 (×2): via INTRAVENOUS

## 2013-08-08 MED ORDER — OXYCODONE HCL 5 MG PO TABS: 5.0000 mg | ORAL_TABLET | Freq: Four times a day (QID) | ORAL | Status: DC | PRN

## 2013-08-08 MED ORDER — THROMBIN 20000 UNITS EX SOLR
CUTANEOUS | Status: DC | PRN
  Administered 2013-08-08: 09:00:00 via TOPICAL

## 2013-08-08 MED ORDER — HEPARIN SODIUM (PORCINE) 1000 UNIT/ML IJ SOLN
INTRAMUSCULAR | Status: AC
  Filled 2013-08-08: qty 1

## 2013-08-08 MED ORDER — DEXTROSE 5 % IV SOLN
INTRAVENOUS | Status: AC
  Filled 2013-08-08: qty 1.5

## 2013-08-08 MED ORDER — ONDANSETRON HCL 4 MG/2ML IJ SOLN
INTRAMUSCULAR | Status: DC | PRN
  Administered 2013-08-08: 4 mg via INTRAVENOUS

## 2013-08-08 MED ORDER — ROCURONIUM BROMIDE 50 MG/5ML IV SOLN
INTRAVENOUS | Status: AC
  Filled 2013-08-08: qty 1

## 2013-08-08 MED ORDER — LIDOCAINE HCL (CARDIAC) 20 MG/ML IV SOLN
INTRAVENOUS | Status: AC
  Filled 2013-08-08: qty 5

## 2013-08-08 MED ORDER — NEOSTIGMINE METHYLSULFATE 1 MG/ML IJ SOLN
INTRAMUSCULAR | Status: DC | PRN
  Administered 2013-08-08: 3 mg via INTRAVENOUS

## 2013-08-08 MED ORDER — PHENYLEPHRINE 40 MCG/ML (10ML) SYRINGE FOR IV PUSH (FOR BLOOD PRESSURE SUPPORT)
PREFILLED_SYRINGE | INTRAVENOUS | Status: AC
  Filled 2013-08-08: qty 10

## 2013-08-08 MED ORDER — PROTAMINE SULFATE 10 MG/ML IV SOLN
INTRAVENOUS | Status: DC | PRN
  Administered 2013-08-08: 50 mg via INTRAVENOUS

## 2013-08-08 MED ORDER — 0.9 % SODIUM CHLORIDE (POUR BTL) OPTIME
TOPICAL | Status: DC | PRN
  Administered 2013-08-08: 1000 mL

## 2013-08-08 MED ORDER — ONDANSETRON HCL 4 MG/2ML IJ SOLN: 4.0000 mg | Freq: Once | INTRAMUSCULAR | Status: DC | PRN

## 2013-08-08 MED ORDER — GLYCOPYRROLATE 0.2 MG/ML IJ SOLN
INTRAMUSCULAR | Status: DC | PRN
  Administered 2013-08-08: 0.4 mg via INTRAVENOUS

## 2013-08-08 MED ORDER — LIDOCAINE HCL 4 % MT SOLN
OROMUCOSAL | Status: DC | PRN
  Administered 2013-08-08: 4 mL via TOPICAL

## 2013-08-08 MED ORDER — PROTAMINE SULFATE 10 MG/ML IV SOLN
INTRAVENOUS | Status: AC
  Filled 2013-08-08: qty 5

## 2013-08-08 MED ORDER — PROPOFOL 10 MG/ML IV BOLUS
INTRAVENOUS | Status: DC | PRN
  Administered 2013-08-08: 200 mg via INTRAVENOUS

## 2013-08-08 MED ORDER — ARTIFICIAL TEARS OP OINT
TOPICAL_OINTMENT | OPHTHALMIC | Status: DC | PRN
  Administered 2013-08-08: 1 via OPHTHALMIC

## 2013-08-08 MED ORDER — ONDANSETRON HCL 4 MG/2ML IJ SOLN
INTRAMUSCULAR | Status: AC
  Filled 2013-08-08: qty 2

## 2013-08-08 MED ORDER — PROPOFOL 10 MG/ML IV BOLUS
INTRAVENOUS | Status: AC
  Filled 2013-08-08: qty 20

## 2013-08-08 MED ORDER — SUCCINYLCHOLINE CHLORIDE 20 MG/ML IJ SOLN
INTRAMUSCULAR | Status: AC
  Filled 2013-08-08: qty 1

## 2013-08-08 MED ORDER — EPHEDRINE SULFATE 50 MG/ML IJ SOLN
INTRAMUSCULAR | Status: AC
  Filled 2013-08-08: qty 1

## 2013-08-08 MED ORDER — LIDOCAINE HCL 1 % IJ SOLN
INTRAMUSCULAR | Status: DC | PRN
  Administered 2013-08-08: 100 mg via INTRADERMAL

## 2013-08-08 MED ORDER — ARTIFICIAL TEARS OP OINT
TOPICAL_OINTMENT | OPHTHALMIC | Status: AC
  Filled 2013-08-08: qty 3.5

## 2013-08-08 MED ORDER — PHENYLEPHRINE HCL 10 MG/ML IJ SOLN
INTRAMUSCULAR | Status: DC | PRN
  Administered 2013-08-08: 40 ug via INTRAVENOUS
  Administered 2013-08-08: 80 ug via INTRAVENOUS

## 2013-08-08 MED ORDER — LIDOCAINE HCL (PF) 1 % IJ SOLN
INTRAMUSCULAR | Status: AC
  Filled 2013-08-08: qty 30

## 2013-08-08 MED ORDER — FENTANYL CITRATE 0.05 MG/ML IJ SOLN
INTRAMUSCULAR | Status: AC
  Filled 2013-08-08: qty 5

## 2013-08-08 MED ORDER — HEPARIN SODIUM (PORCINE) 1000 UNIT/ML IJ SOLN
INTRAMUSCULAR | Status: DC | PRN
  Administered 2013-08-08: 5000 [IU] via INTRAVENOUS

## 2013-08-08 MED ORDER — ROCURONIUM BROMIDE 100 MG/10ML IV SOLN
INTRAVENOUS | Status: DC | PRN
  Administered 2013-08-08: 30 mg via INTRAVENOUS

## 2013-08-08 MED ORDER — HYDROMORPHONE HCL PF 1 MG/ML IJ SOLN
INTRAMUSCULAR | Status: AC
  Filled 2013-08-08: qty 1

## 2013-08-08 MED ORDER — DEXTROSE 5 % IV SOLN
1.5000 g | INTRAVENOUS | Status: DC | PRN
  Administered 2013-08-08: 1.5 g via INTRAVENOUS

## 2013-08-08 SURGICAL SUPPLY — 45 items
BAG ISOLATION DRAPE 18X18 (DRAPES) IMPLANT
BLADE SURG ROTATE 9660 (MISCELLANEOUS) ×2 IMPLANT
CANISTER SUCTION 2500CC (MISCELLANEOUS) ×2 IMPLANT
CLIP TI MEDIUM 6 (CLIP) ×2 IMPLANT
CLIP TI WIDE RED SMALL 6 (CLIP) ×2 IMPLANT
COVER SURGICAL LIGHT HANDLE (MISCELLANEOUS) ×2 IMPLANT
DECANTER SPIKE VIAL GLASS SM (MISCELLANEOUS) ×2 IMPLANT
DERMABOND ADVANCED (GAUZE/BANDAGES/DRESSINGS) ×1
DERMABOND ADVANCED .7 DNX12 (GAUZE/BANDAGES/DRESSINGS) ×1 IMPLANT
DRAPE ISOLATION BAG 18X18 (DRAPES)
ELECT REM PT RETURN 9FT ADLT (ELECTROSURGICAL) ×2
ELECTRODE REM PT RTRN 9FT ADLT (ELECTROSURGICAL) ×1 IMPLANT
GAUZE SPONGE 2X2 8PLY STRL LF (GAUZE/BANDAGES/DRESSINGS) ×1 IMPLANT
GEL ULTRASOUND 20GR AQUASONIC (MISCELLANEOUS) IMPLANT
GLOVE BIO SURGEON STRL SZ 6.5 (GLOVE) ×4 IMPLANT
GLOVE BIO SURGEON STRL SZ7.5 (GLOVE) ×2 IMPLANT
GLOVE BIOGEL PI IND STRL 7.0 (GLOVE) ×1 IMPLANT
GLOVE BIOGEL PI INDICATOR 7.0 (GLOVE) ×1
GLOVE SURG SS PI 7.0 STRL IVOR (GLOVE) ×2 IMPLANT
GOWN STRL REUS W/ TWL LRG LVL3 (GOWN DISPOSABLE) ×3 IMPLANT
GOWN STRL REUS W/ TWL XL LVL3 (GOWN DISPOSABLE) ×1 IMPLANT
GOWN STRL REUS W/TWL LRG LVL3 (GOWN DISPOSABLE) ×3
GOWN STRL REUS W/TWL XL LVL3 (GOWN DISPOSABLE) ×1
GRAFT GORETEX 6X10 (Vascular Products) ×2 IMPLANT
KIT BASIN OR (CUSTOM PROCEDURE TRAY) ×2 IMPLANT
KIT ROOM TURNOVER OR (KITS) ×2 IMPLANT
LOOP VESSEL MAXI BLUE (MISCELLANEOUS) ×2 IMPLANT
LOOP VESSEL MINI RED (MISCELLANEOUS) IMPLANT
NEEDLE HYPO 25GX1X1/2 BEV (NEEDLE) ×2 IMPLANT
NS IRRIG 1000ML POUR BTL (IV SOLUTION) ×2 IMPLANT
PACK CV ACCESS (CUSTOM PROCEDURE TRAY) ×2 IMPLANT
PACK UNIVERSAL I (CUSTOM PROCEDURE TRAY) ×2 IMPLANT
PAD ARMBOARD 7.5X6 YLW CONV (MISCELLANEOUS) ×4 IMPLANT
SPONGE GAUZE 2X2 STER 10/PKG (GAUZE/BANDAGES/DRESSINGS) ×1
SPONGE GAUZE 4X4 12PLY (GAUZE/BANDAGES/DRESSINGS) ×2 IMPLANT
SPONGE SURGIFOAM ABS GEL 100 (HEMOSTASIS) IMPLANT
SUT PROLENE 6 0 CC (SUTURE) ×6 IMPLANT
SUT VIC AB 3-0 SH 27 (SUTURE) ×1
SUT VIC AB 3-0 SH 27X BRD (SUTURE) ×1 IMPLANT
SUT VICRYL 4-0 PS2 18IN ABS (SUTURE) ×2 IMPLANT
SYR 20CC LL (SYRINGE) ×2 IMPLANT
TOWEL OR 17X24 6PK STRL BLUE (TOWEL DISPOSABLE) ×4 IMPLANT
TOWEL OR 17X26 10 PK STRL BLUE (TOWEL DISPOSABLE) ×2 IMPLANT
UNDERPAD 30X30 INCONTINENT (UNDERPADS AND DIAPERS) ×2 IMPLANT
WATER STERILE IRR 1000ML POUR (IV SOLUTION) ×2 IMPLANT

## 2013-08-08 NOTE — Anesthesia Postprocedure Evaluation (Signed)
  Anesthesia Post-op Note  Patient: [name]  Procedure(s) Performed: Procedure(s): REVISION OF LEFT THIGH ARTERIOVENOUS GORETEX GRAFT (Left)  Patient Location: PACU  Anesthesia Type:General  Level of Consciousness: awake, alert , oriented and patient cooperative  Airway and Oxygen Therapy: Patient Spontanous Breathing  Post-op Pain: mild  Post-op Assessment: Post-op Vital signs reviewed, Patient's Cardiovascular Status Stable, Respiratory Function Stable, Patent Airway and No signs of Nausea or vomiting  Post-op Vital Signs: stable  Last Vitals:  Filed Vitals:   08/08/13 1100  BP: 99/55  Pulse: 70  Temp: 36.3 C  Resp: 16    Complications: No apparent anesthesia complications

## 2013-08-08 NOTE — Discharge Instructions (Signed)
° ° °  08/08/2013 [name] [id number] 1955/11/06  Surgeon(s): Sherren Kerns, MD  Procedure(s): REVISION OF LEFT THIGH ARTERIOVENOUS GORETEX GRAFT  x May stick graft on designated area only:  Do not stick the MEDIAL side for 4 weeks.  Only stick lateral side for 4 weeks.  (see diagram)

## 2013-08-08 NOTE — Progress Notes (Signed)
Subjective:  No complaints s/p surgery this morning, pain currently controlled  Objective: Vital signs in last 24 hours: Temp:  [97.2 F (36.2 C)-98.7 F (37.1 C)] 97.3 F (36.3 C) (04/14 1100) Pulse Rate:  [64-89] 70 (04/14 1100) Resp:  [14-20] 16 (04/14 1100) BP: (99-172)/(52-95) 99/55 mmHg (04/14 1100) SpO2:  [95 %-99 %] 98 % (04/14 1100) Weight:  [125.8 kg (277 lb 5.4 oz)-129 kg (284 lb 6.3 oz)] 125.8 kg (277 lb 5.4 oz) (04/13 1945) Weight change:   Intake/Output from previous day: 04/13 0701 - 04/14 0700 In: 533 [P.O.:480; I.V.:3; IV Piggyback:50] Out: 2806  Intake/Output this shift: Total I/O In: 500 [I.V.:500] Out: 30 [Blood:30]  Lab Results:  Recent Labs  08/07/13 0912  WBC 12.3*  HGB 12.9*  HCT 39.2  PLT 201   BMET:  Recent Labs  08/07/13 0912 08/08/13 0445  NA 136* 136*  K 6.1* 5.1  CL 89* 92*  CO2 23 22  GLUCOSE 88 96  BUN 78* 54*  CREATININE 12.97* 10.18*  CALCIUM 8.7 8.6  ALBUMIN  --  2.9*   No results found for this basename: PTH,  in the last 72 hours Iron Studies: No results found for this basename: IRON, TIBC, TRANSFERRIN, FERRITIN,  in the last 72 hours  Studies/Results: No results found.  EXAM: General appearance:  Alert, in no apparent distress Resp:  CTA without rales, rhonchi, or wheezes Cardio:  RRR without murmur or rub GI: + BS, soft and nontender Extremities: No edema  Access:  L thigh AVG with + bruit, dressing on medial side  Assessment/Plan: 1. Ulceration of L thigh graft - s/p replacement of medial limb by Dr. Darrick Penna today, may use tomorrow.  2. ESRD - HD on MTWF @ [facility], K 5.1 today.  HD tomorrow @ [facility]. 3. HTN/Volume - BP 99/55 on Metoprolol; wt 125.8 kg s/p net UF 2.8 L. 4. Anemia - Hgb 12.9, no Epogen or Fe. 5. 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    LOS: 1 day   Gerome Apley 08/08/2013,12:58 PM I have seen and examined this patient and agree with plan per Gerome Apley.  SP  replacement of medial limb of AVG.  According to Dr Darrick Penna, it did not look infected but will cont vanco for 1 wk as outpt.Marland Kitchen Spoke with Laurena Slimmer at [facility] and ordered vanco 1gm IV x 4 tx. Jorje Guild 08/08/2013 1:34 PM

## 2013-08-08 NOTE — Op Note (Signed)
Procedure: Revision Left thigh graft Preop: Ulceration left thigh graft Postop: same  Anesthesia: General  Asst: Samantha Rhyne PA-C  Findings: 1. Replacement of medial limb of graft with 6 mm PTFE lateral to old graft  Operative Details: After obtaining informed consent, the patient was taken to the operating room.  The patient was placed in supine position on the operating room table.  The anesthesia team commenced a general anesthetic.  The left groin and anterior thigh was prepped and draped in usual fashion.  Next a transverse incision was made on the medial aspect of the left femoral loop graft mid thigh proximal to the ulcerated segment.  The incision was carried through the subcutaneous tissue to the level of the graft and this was dissected free circumferentially.  The graft was well incorporated.  Next an additional incision was made just distal to the ulcer over the graft in similar fashion.  This was also carried through the subcutaneous tissue and and the graft dissected free circumferentially.  It was also well incorporated.  At this point a new 6 mm PTFE graft was brought onto the field and tunneled lateral to the old graft.  The patient was given 5000 units of intravenous heparin.  After 2 minutes of circulation time, the graft was clamped proximally and distally to the ulcerated segment.  The graft was transected above and below the ulcer.  The new graft was then anastomosed end to end proximally and distally with a running 6 0 prolene suture.  After the first anastomosis was completed everything was fore bled and back bled and flushed.  This was then packed with thrombin and gelfoam.  The second anastomosis was completed.  Everything was again flushed.  The anastomosis was secured and the clamps released.  There was good flow immediately.  This was also packed with thrombin and gelfoam.  The patient was given 50 mg of protamine.  Hemostasis was obtained.  The gelfoam was removed and the  wound thoroughly irrigated.  The subcutaneous tissues were reapproximated with a running 3 0 Vicryl.  The skin of both incsions was closed with a 4 0 Vicryl subcuticular.  Dermabond was applied to the incisions and the ulcer was covered with a dry dressing.  The patient tolerated the procedure well and there were no complications.  The instrument sponge and needle count was correct at the end of the case.  The patient was taken to PACU in stable condition.  Fabienne Bruns, MD [facility] Office: [phone] Pager: [phone]

## 2013-08-08 NOTE — Progress Notes (Signed)
Discharge instructions given. Pt verbalized understanding and all questions were answered.  

## 2013-08-08 NOTE — H&P (View-Only) (Signed)
[facility] NOTE   MRN : [id number]  Reason for Consult: Left thigh graft bleeding Referring Physician: ED  History of Present Illness: Patient was seen at dialysis clinic this morning and they were worried about the graft bleeding and had him sent to the ED.  He does have elevated WBC, no temp and reports no fever or chills.  He has a history of multiple dialysis access problems.  His past medical history includes hypercholesterolemia, CAD, and hypertension.  He is on Lipitor, metoprolol, and nitrostat prn.     No current facility-administered medications for this encounter.   Current Outpatient Prescriptions  Medication Sig Dispense Refill  . aspirin 81 MG tablet Take 1 tablet (81 mg total) by mouth daily.      Marland Kitchen atorvastatin (LIPITOR) 80 MG tablet Take 1 tablet (80 mg total) by mouth daily at 6 PM.  30 tablet  11  . b complex-vitamin c-folic acid (NEPHRO-VITE) 0.8 MG TABS Take 0.8 mg by mouth at bedtime.       . calcium carbonate (TUMS - DOSED IN MG ELEMENTAL CALCIUM) 500 MG chewable tablet Chew 3 tablets by mouth 3 (three) times daily.      . colchicine 0.6 MG tablet Take 0.6 mg by mouth 2 (two) times daily as needed. For gout      . cyclobenzaprine (FLEXERIL) 5 MG tablet Take 1 tablet (5 mg total) by mouth 2 (two) times daily as needed for muscle spasms.  20 tablet  0  . esomeprazole (NEXIUM) 40 MG capsule Take 40 mg by mouth every evening.       . metoprolol tartrate (LOPRESSOR) 25 MG tablet Take 1 tablet (25 mg total) by mouth 2 (two) times daily.  60 tablet  11  . nitroGLYCERIN (NITROSTAT) 0.4 MG SL tablet Place 1 tablet (0.4 mg total) under the tongue every 5 (five) minutes as needed for chest pain.  25 tablet  3  . oxyCODONE (OXY IR/ROXICODONE) 5 MG immediate release tablet Take 1 tablet by mouth 3 (three) times daily as needed for moderate pain.       . prasugrel (EFFIENT) 10 MG TABS tablet Take 1 tablet (10 mg total) by mouth daily.  30  tablet  6  . sodium bicarbonate 650 MG tablet Take 650 mg by mouth 2 (two) times daily.        . sulindac (CLINORIL) 200 MG tablet Take 200 mg by mouth 2 (two) times daily.      . traZODone (DESYREL) 50 MG tablet Take 50 mg by mouth at bedtime as needed for sleep.         Pt meds include: Statin :Yes Betablocker: Yes ASA: Yes Other anticoagulants/antiplatelets:   Past Medical History  Diagnosis Date  . Anxiety   . Back pain   . GERD (gastroesophageal reflux disease)   . Peripheral neuropathy   . Arthritis   . CHF (congestive heart failure)   . Active smoker   . ESRD on hemodialysis     [name] 4 days per week on M-Tu-Wed and Fri.  Started HD in 1998 and has been on HD initially at [facility], then went to [facility], then in [facility], then to [facility] and now is at [facility] for last 10 years.  Has L thigh AVG.     . Diabetes mellitus     diet controlled  . Hepatitis 2010    pt states hx of hep B 3  yrs ago  . PONV (postoperative nausea and vomiting)   . Hypertension   . Bell palsy   . Heart murmur   . Carpal tunnel syndrome     bilateral  . Dysrhythmia     Hx: of palpitataions  . Headache(784.0)     Hx: of Migraines    Past Surgical History  Procedure Laterality Date  . Total hip arthroplasty    . Thyroidectomy    . Tooth extraction    . Mandible fracture surgery    . Dg av dialysis graft declot or    . Insertion of dialysis catheter  03/28/2011    Procedure: INSERTION OF DIALYSIS CATHETER;  Surgeon: Pryor OchoaJames D Lawson, MD;  [facility];  Service: Vascular;  Laterality: Left;  . Av fistula placement  03/31/2011    Procedure: INSERTION OF ARTERIOVENOUS (AV) GORE-TEX GRAFT THIGH;  Surgeon: Sherren Kernsharles E Fields, MD;  Location: [facility] OR;  Service: Vascular;  Laterality: Right;  redo right thigh arteriovenous gortex graft using gore-tex stretch 6mm x 70cm  . Thrombectomy w/ embolectomy  06/02/2011    Procedure: THROMBECTOMY ARTERIOVENOUS GORE-TEX GRAFT;  Surgeon: Chuck Hinthristopher  S Dickson, MD;  [facility];  Service: Vascular;  Laterality: Right;  Thrombectomy right thigh arteriovenous gortex graft;  revision by  replacement of large portion of graft with 7mm gore-tex   . Insertion of dialysis catheter  08/28/2011    Procedure: INSERTION OF DIALYSIS CATHETER;  Surgeon: Fransisco HertzBrian L Chen, MD;  [facility];  Service: Vascular;  Laterality: Left;  Atempted Bilateral Internal Jugular, Bilateral Subclavin insertion of 55cm Dialysis Catheter Left Femoral  . Insertion of dialysis catheter  12/15/2011    Procedure: INSERTION OF DIALYSIS CATHETER;  Surgeon: Sherren Kernsharles E Fields, MD;  [facility];  Service: Vascular;  Laterality: Right;  Insertion of Right Femoral Dialysis Catheter  . Exchange of a dialysis catheter  02/26/2012    Procedure: EXCHANGE OF A DIALYSIS CATHETER;  Surgeon: Larina Earthlyodd F Early, MD;  [facility];  Service: Vascular;  Laterality: Right;  . Joint replacement    . Exchange of a dialysis catheter  05/18/2012    Procedure: EXCHANGE OF A DIALYSIS CATHETER;  Surgeon: Larina Earthlyodd F Early, MD;  [facility];  Service: Vascular;  Laterality: Right;  right femoral dialysis catheter  . Av fistula placement  05/18/2012    Procedure: INSERTION OF ARTERIOVENOUS (AV) GORE-TEX GRAFT THIGH;  Surgeon: Larina Earthlyodd F Early, MD;  [facility];  Service: Vascular;  Laterality: Left;  using 6mm by 70cm goretex graft  . Thrombectomy w/ embolectomy Left 08/25/2012    Procedure: THROMBECTOMY ARTERIOVENOUS GORE-TEX GRAFT;  Surgeon: Pryor OchoaJames D Lawson, MD;  [facility];  Service: Vascular;  Laterality: Left;  Attempted thrombectomy of left thigh arteriovenous gortex graft.   . Av fistula placement Left 08/25/2012    Procedure: INSERTION OF ARTERIOVENOUS (AV) GORE-TEX GRAFT THIGH;  Surgeon: Pryor OchoaJames D Lawson, MD;  [facility];  Service: Vascular;  Laterality: Left;  Using 6mm x 40cm vascular Gortex graft.  . Revision of arteriovenous goretex graft Left 12/14/2012    Procedure: Revision of Left Thigh  Graft;  Surgeon: Larina Earthlyodd F Early, MD;  [facility];  Service: Vascular;  Laterality: Left;  . Avgg removal Left 02/16/2013    Procedure: EXCISION OF LEFT ARM ARTERIOVENOUS GORETEX GRAFT TIMES 2 WITH VEIN PATCH ANGIOPLASTY OF BRACIAL ARTERY.  ;  Surgeon: Chuck Hinthristopher S Dickson, MD;  [facility];  Service: Vascular;  Laterality: Left;  Converted  from MAC to General.      Social History History  Substance Use Topics  . Smoking status: Current Every Day Smoker -- 1.00 packs/day for 40 years    Types: Cigarettes  . Smokeless tobacco: Never Used     Comment: pt states that he wants to quit "1 cigarette every now and then" - 06/08/13  . Alcohol Use: No    Family History Family History  Problem Relation Age of Onset  . Diabetes Mother   . Stroke Mother   . Heart disease Mother   . Kidney disease Father   . Hyperlipidemia Father     No Known Allergies   REVIEW OF SYSTEMS  General: [ ]  Weight loss, [ ]  Fever, [ ]  chills Neurologic: [ ]  Dizziness, [ ]  Blackouts, [ ]  Seizure [ ]  Stroke, [ ]  "Mini stroke", [ ]  Slurred speech, [ ]  Temporary blindness; [ ]  weakness in arms or legs, [ ]  Hoarseness [ ]  Dysphagia Cardiac: [ ]  Chest pain/pressure, [ ]  Shortness of breath at rest [ ]  Shortness of breath with exertion, [ ]  Atrial fibrillation or irregular heartbeat  Vascular: [ ]  Pain in legs with walking, [ ]  Pain in legs at rest, [ ]  Pain in legs at night,  [ ]  Non-healing ulcer, [ ]  Blood clot in vein/DVT,   Pulmonary: [ ]  Home oxygen, [ ]  Productive cough, [ ]  Coughing up blood, [ ]  Asthma,  [ ]  Wheezing [ ]  COPD Musculoskeletal:  [ ]  Arthritis, [ ]  Low back pain, [ ]  Joint pain Hematologic: [ ]  Easy Bruising, [x ] Anemia; [ ]  Hepatitis Gastrointestinal: [ ]  Blood in stool, [ ]  Gastroesophageal Reflux/heartburn, Urinary: [x ] chronic Kidney disease, [ ]  on HD - [x ] MWF or [ ]  TTHS, [ ]  Burning with urination, [ ]  Difficulty urinating Skin: [ ]  Rashes, [x ] Wounds Psychological: [ ]   Anxiety, [ ]  Depression  Physical Examination Filed Vitals:   08/07/13 0826  BP: 106/74  Pulse: 66  Temp: 98.1 F (36.7 C)  TempSrc: Oral  Resp: 18  Height: 5\' 11"  (1.803 m)  Weight: 283 lb (128.368 kg)  SpO2: 97%   Body mass index is 39.49 kg/(m^2).  General:  WDWN in NAD Gait: Normal HENT: WNL Eyes: Pupils equal Pulmonary: normal non-labored breathing , without Rales, rhonchi,  wheezing Cardiac: RRR, without  Murmurs, rubs or gallops; No carotid bruits Abdomen: soft, NT, no masses Skin: no rashes, ulcers noted;  no Gangrene , no cellulitis; pos open wounds over left AV graft stick site.  Vascular Exam/Pulses:palpable thrill in left thigh AV graft.     Musculoskeletal: no muscle wasting or atrophy; no edema  Neurologic: A&O X 3; Appropriate Affect ;  SENSATION: normal; MOTOR FUNCTION: 5/5 Symmetric Speech is fluent/normal   Significant Diagnostic Studies: CBC Lab Results  Component Value Date   WBC 12.3* 08/07/2013   HGB 12.9* 08/07/2013   HCT 39.2 08/07/2013   MCV 90.3 08/07/2013   PLT 201 08/07/2013    BMET    Component Value Date/Time   NA 136* 08/07/2013 0912   K 6.1* 08/07/2013 0912   CL 89* 08/07/2013 0912   CO2 23 08/07/2013 0912   GLUCOSE 88 08/07/2013 0912   BUN 78* 08/07/2013 0912   CREATININE 12.97* 08/07/2013 0912   CALCIUM 8.7 08/07/2013 0912   GFRNONAA 4* 08/07/2013 0912   GFRAA 4* 08/07/2013 0912   Estimated Creatinine Clearance: 8.5 ml/min (by C-G formula based on Cr of 12.97).  COAG Lab Results  Component Value Date   INR 1.10 08/28/2011   INR 1.08 03/31/2011   INR 0.91 06/09/2010     Non-Invasive Vascular Imaging:  Ultrasound of the graft was performed results pending  ASSESSMENT/PLAN:   Left thigh av graft bleeding from stick site with ulcer.  OR tomorrow for medial graft revision left thigh  Plan: admit to the hospital today, consult Nephrology for dialysis needs  He normally dialysis M-W-F.  I spoke with Dr. Lauree Chandler Narda Amber 08/07/2013 11:09 AM  History and exam details as above Ulcerated medial segment of graft currently not bleeding but very high risk Will admit today due to high risk of bleeding with plan to revise graft tomorrow  Fabienne Bruns, MD [facility] [place] Office: [phone] Pager: [phone]

## 2013-08-08 NOTE — Transfer of Care (Signed)
Immediate Anesthesia Transfer of Care Note  Patient: [name]  Procedure(s) Performed: Procedure(s): REVISION OF LEFT THIGH ARTERIOVENOUS GORETEX GRAFT (Left)  Patient Location: PACU  Anesthesia Type:General  Level of Consciousness: awake, alert , oriented and patient cooperative  Airway & Oxygen Therapy: Patient Spontanous Breathing and Patient connected to face mask oxygen  Post-op Assessment: Report given to PACU RN, Post -op Vital signs reviewed and stable and Patient moving all extremities X 4  Post vital signs: Reviewed and stable  Complications: No apparent anesthesia complications

## 2013-08-08 NOTE — Interval H&P Note (Signed)
History and Physical Interval Note:  08/08/2013 7:33 AM  [name]  has presented today for surgery, with the diagnosis of End Stage Renal Disease Bleeding from Left Thigh Arteriovenous Gortex Graft   The various methods of treatment have been discussed with the patient and family. After consideration of risks, benefits and other options for treatment, the patient has consented to  Procedure(s): REVISION OF ARTERIOVENOUS GORETEX GRAFT- LEFT THIGH (Left) as a surgical intervention .  The patient's history has been reviewed, patient examined, no change in status, stable for surgery.  I have reviewed the patient's chart and labs.  Questions were answered to the patient's satisfaction.     Sherren Kerns

## 2013-08-08 NOTE — Anesthesia Preprocedure Evaluation (Addendum)
Anesthesia Evaluation  Patient identified by MRN, date of birth, ID band Patient awake    Reviewed: Allergy & Precautions, H&P , NPO status , Patient's Chart, lab work & pertinent test results  History of Anesthesia Complications (+) PONV  Airway       Dental   Pulmonary Current Smoker,          Cardiovascular hypertension, + CAD, + Past MI, + Cardiac Stents and +CHF + dysrhythmias     Neuro/Psych  Headaches,  Neuromuscular disease    GI/Hepatic GERD-  ,(+) Hepatitis -  Endo/Other  diabetes, Type 2  Renal/GU ESRF and DialysisRenal disease     Musculoskeletal   Abdominal   Peds  Hematology   Anesthesia Other Findings   Reproductive/Obstetrics                          Anesthesia Physical Anesthesia Plan  ASA: III  Anesthesia Plan: General   Post-op Pain Management:    Induction: Intravenous  Airway Management Planned: Oral ETT and LMA  Additional Equipment:   Intra-op Plan:   Post-operative Plan: Extubation in OR  Informed Consent: I have reviewed the patients History and Physical, chart, labs and discussed the procedure including the risks, benefits and alternatives for the proposed anesthesia with the patient or authorized representative who has indicated his/her understanding and acceptance.     Plan Discussed with:   Anesthesia Plan Comments:        Anesthesia Quick Evaluation

## 2013-08-08 NOTE — Anesthesia Procedure Notes (Addendum)
Procedure Name: Intubation Date/Time: 08/08/2013 7:59 AM Performed by: Ned Grace Pre-anesthesia Checklist: Patient identified, Emergency Drugs available, Patient being monitored, Timeout performed and Suction available Patient Re-evaluated:Patient Re-evaluated prior to inductionOxygen Delivery Method: Circle system utilized Preoxygenation: Pre-oxygenation with 100% oxygen Intubation Type: IV induction Ventilation: Mask ventilation without difficulty and Oral airway inserted - appropriate to patient size Laryngoscope Size: Mac and 4 Grade View: Grade I Tube type: Oral Tube size: 7.5 mm Number of attempts: 1 Airway Equipment and Method: Stylet and LTA kit utilized Placement Confirmation: ETT inserted through vocal cords under direct vision,  breath sounds checked- equal and bilateral and positive ETCO2 Secured at: 23 cm Tube secured with: Tape Dental Injury: Teeth and Oropharynx as per pre-operative assessment

## 2013-08-09 ENCOUNTER — Telehealth: Payer: Self-pay | Admitting: Vascular Surgery

## 2013-08-09 ENCOUNTER — Encounter (HOSPITAL_COMMUNITY): Payer: Self-pay | Admitting: Vascular Surgery

## 2013-08-09 NOTE — Telephone Encounter (Addendum)
Message copied by Fredrich Birks on Wed Aug 09, 2013 11:12 AM ------      Message from: Fredrich Birks      Created: Tue Aug 08, 2013  3:39 PM      Regarding: FW: Schedule                   ----- Message -----         From: Sharee Pimple, CMA         Sent: 08/08/2013  10:59 AM           To: [facility] Admin Pool      Subject: Schedule                                                             ----- Message -----         From: Dara Lords, PA-C         Sent: 08/08/2013   9:43 AM           To: [facility] Charge Pool            S/p revision of medial side of thigh graft 08/08/13.  CEF wants to see him back in 2 weeks.            Thanks,      Lelon Mast ------  08/09/13: lm for pt re appt, dpm

## 2013-08-10 NOTE — Discharge Summary (Signed)
[facility] Discharge Summary  [name] [birth year] 58 y.o. male  [id number]  Admission Date: 08/07/2013  Discharge Date: 08/08/13  Physician: No att. providers found  Admission Diagnosis: Hemodialysis graft malfunction [996.1]   HPI:   This is a 58 y.o. male Patient was seen at dialysis clinic this morning and they were worried about the graft bleeding and had him sent to the ED. He does have elevated WBC, no temp and reports no fever or chills. He has a history of multiple dialysis access problems. His past medical history includes hypercholesterolemia, CAD, and hypertension. He is on Lipitor, metoprolol, and nitrostat prn.  Hospital Course:  The patient was admitted to the hospital and taken for [name].  VVS was consulted for bleeding from his graft.  After inspection of the graft, he was admitted for [name] and taken to the operating room the next morning. He was taken to the operating room on  08/08/2013 and underwent: Revision of left thigh graft for bleeding.   The pt tolerated the procedure well and was transported to the PACU in good condition. He was discharged home after the procedure.  The remainder of the hospital course consisted of increasing mobilization and increasing intake of solids without difficulty.  CBC    Component Value Date/Time   WBC 12.3* 08/07/2013 0912   RBC 4.34 08/07/2013 0912   HGB 12.9* 08/07/2013 0912   HCT 39.2 08/07/2013 0912   PLT 201 08/07/2013 0912   MCV 90.3 08/07/2013 0912   MCH 29.7 08/07/2013 0912   MCHC 32.9 08/07/2013 0912   RDW 17.3* 08/07/2013 0912   LYMPHSABS 1.5 12/14/2012 1130   MONOABS 1.3* 12/14/2012 1130   EOSABS 0.3 12/14/2012 1130   BASOSABS 0.0 12/14/2012 1130    BMET    Component Value Date/Time   NA 136* 08/08/2013 0445   K 5.1 08/08/2013 0445   CL 92* 08/08/2013 0445   CO2 22 08/08/2013 0445   GLUCOSE 96 08/08/2013 0445   BUN 54* 08/08/2013 0445   CREATININE 10.18* 08/08/2013 0445   CALCIUM 8.6 08/08/2013 0445    GFRNONAA 5* 08/08/2013 0445   GFRAA 6* 08/08/2013 0445     Discharge Instructions:   The patient is discharged to home with extensive instructions on wound care and progressive ambulation.  They are instructed not to drive or perform any heavy lifting until returning to see the physician in his office.  Discharge Orders   Future Appointments Provider Department Dept Phone   08/24/2013 1:30 PM Sherren Kerns, MD [facility] [phone]   Future Orders Complete By Expires   Call MD for:  redness, tenderness, or signs of infection (pain, swelling, bleeding, redness, odor or green/yellow discharge around incision site)  As directed    Call MD for:  severe or increased pain, loss or decreased feeling  in affected limb(s)  As directed    Call MD for:  temperature >100.5  As directed    Driving Restrictions  As directed    Lifting restrictions  As directed    may wash over wound with mild soap and water  As directed    Resume previous diet  As directed       Discharge Diagnosis:  Hemodialysis graft malfunction [996.1]  Secondary Diagnosis: Patient Active Problem List   Diagnosis Date Noted  . CKD (chronic kidney disease) 08/07/2013  . Acute myocardial infarction of right ventricle 05/22/2013  . History of placement of stent in LAD coronary  artery 05/22/2013  . HTN (hypertension) 05/22/2013  . ST-segment elevation myocardial infarction (STEMI) of inferior wall 05/21/2013  . Other complications due to renal dialysis device, implant, and graft 02/14/2013  . End-stage renal disease on hemodialysis 07/23/2011  . Arthritis    Past Medical History  Diagnosis Date  . Anxiety   . Back pain   . GERD (gastroesophageal reflux disease)   . Peripheral neuropathy   . Arthritis   . CHF (congestive heart failure)   . Active smoker   . ESRD on hemodialysis     [name] 4 days per week on M-Tu-Wed and Fri.  Started [name] in 1998 and has been [name], then [facility] [place] [name], then [facility], then [facility] and now is at [facility] for last 10 years.  Has L thigh AVG.     . Diabetes mellitus     diet controlled  . 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 PONV (postoperative nausea and vomiting)   . Hypertension   . Bell palsy   . Heart murmur   . Carpal tunnel syndrome     bilateral  . Dysrhythmia     Hx: of palpitataions  . Headache(784.0)     Hx: of Migraines       Medication List         aspirin EC 81 MG tablet  Take 1 tablet (81 mg total) by mouth daily.     atorvastatin 80 MG tablet  Commonly known as:  LIPITOR  Take 1 tablet (80 mg total) by mouth daily at 6 PM.     b complex-vitamin c-folic acid 0.8 MG Tabs tablet  Take 0.8 mg by mouth at bedtime.     calcium carbonate 500 MG chewable tablet  Commonly known as:  TUMS - dosed in mg elemental calcium  Chew 3 tablets by mouth 3 (three) times daily.     colchicine 0.6 MG tablet  Take 0.6 mg by mouth 2 (two) times daily as needed. For gout     cyclobenzaprine 5 MG tablet  Commonly known as:  FLEXERIL  Take 1 tablet (5 mg total) by mouth 2 (two) times daily as needed for muscle spasms.     esomeprazole 40 MG capsule  Commonly known as:  NEXIUM  Take 40 mg by mouth every evening.     metoprolol tartrate 25 MG tablet  Commonly known as:  LOPRESSOR  Take 1 tablet (25 mg total) by mouth 2 (two) times daily.     nitroGLYCERIN 0.4 MG SL tablet  Commonly known as:  NITROSTAT  Place 1 tablet (0.4 mg total) under the tongue every 5 (five) minutes as needed for chest pain.     oxyCODONE 5 MG immediate release tablet  Commonly known as:  Oxy IR/ROXICODONE  Take 1 tablet (5 mg total) by mouth every 6 (six) hours as needed for moderate pain.     prasugrel 10 MG Tabs tablet  Commonly known as:  EFFIENT  Take 1 tablet (10 mg total) by mouth daily.     sodium bicarbonate 650 MG tablet  Take 650 mg by mouth 2 (two) times daily.      sulindac 200 MG tablet  Commonly known as:  CLINORIL  Take 200 mg by mouth 2 (two) times daily.     traZODone 50 MG tablet  Commonly known as:  DESYREL  Take 50  mg by mouth at bedtime as needed for sleep.        Roxicodone #30 No Refill  Disposition: home  Patient's condition: is Good  Follow up: 1. Dr. Darrick Penna in 2 weeks   Doreatha Massed, PA-C [facility] [phone] 08/10/2013  12:40 PM

## 2013-08-23 ENCOUNTER — Encounter: Payer: Self-pay | Admitting: Vascular Surgery

## 2013-08-24 ENCOUNTER — Encounter: Payer: Medicare Other | Admitting: Vascular Surgery

## 2013-08-24 ENCOUNTER — Other Ambulatory Visit (HOSPITAL_COMMUNITY): Payer: Medicare Other

## 2013-08-24 ENCOUNTER — Ambulatory Visit: Payer: Medicare Other | Admitting: Vascular Surgery

## 2014-01-25 ENCOUNTER — Ambulatory Visit (INDEPENDENT_AMBULATORY_CARE_PROVIDER_SITE_OTHER): Payer: Medicare Other | Admitting: Internal Medicine

## 2014-01-25 ENCOUNTER — Encounter: Payer: Self-pay | Admitting: Internal Medicine

## 2014-01-25 VITALS — BP 138/92 | HR 100 | Ht 71.0 in | Wt 261.7 lb

## 2014-01-25 DIAGNOSIS — I219 Acute myocardial infarction, unspecified: Secondary | ICD-10-CM

## 2014-01-25 DIAGNOSIS — I2129 ST elevation (STEMI) myocardial infarction involving other sites: Secondary | ICD-10-CM

## 2014-01-25 DIAGNOSIS — I2510 Atherosclerotic heart disease of native coronary artery without angina pectoris: Secondary | ICD-10-CM

## 2014-01-25 DIAGNOSIS — I10 Essential (primary) hypertension: Secondary | ICD-10-CM

## 2014-01-25 DIAGNOSIS — Z992 Dependence on renal dialysis: Secondary | ICD-10-CM

## 2014-01-25 DIAGNOSIS — I2119 ST elevation (STEMI) myocardial infarction involving other coronary artery of inferior wall: Secondary | ICD-10-CM

## 2014-01-25 DIAGNOSIS — N186 End stage renal disease: Secondary | ICD-10-CM

## 2014-01-25 DIAGNOSIS — Z955 Presence of coronary angioplasty implant and graft: Secondary | ICD-10-CM

## 2014-01-25 NOTE — Progress Notes (Signed)
OFFICE NOTE  Chief Complaint:  Fatigued, tired  Primary Care Physician: Dyke Maes, MD  HPI:  [name] is a 58 y.o. male with no previous history of CAD. He is ESRD on HD, DM, neuropathy, HTN, HL. He had chest pain and went to [facility] the next day. His ECG was abnormal and showed borderline ST elevation. His troponin was elevated and his ECG became more abnormal. He was transferred to [facility] and taken directly to the cath lab. Results are below. He had 3 vessel disease, but he was not felt to be a candidate for emergent CABG. He had a DES to the LAD and PCI to the RCA was attempted but it was occluded and could not be crossed. The CFX also had high-grade stenosis and will be treated medically. His EF was preserved. There were problems with sedation as he has chronic MS pain issues. Anesthesia assisted during the case and propofol was used for sedation. With this, he tolerated the procedure without complication. He was started on a beta blocker and a statin. He is not on an ACE inhibitor because of his renal dysfunction. A ventriculogram performed during the cath showed some basal wall hypokinesis. An echocardiogram was performed but the images were poor. 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 A Nephrology consult was called to manage his volume status. He had some volume on his CXR, but this was controlled at HD. The nephrology team coordinated his HD appointments and determined his post-HD weight goal. He is to keep all HD appointments. He was seen by cardiac rehab and educated on MI restrictions, heart-healthy lifestyle modifications and exercise guidelines. He needed a rolling walker and this was ordered. He will also have a HHRN, PT and an aide.  On 01/28, he was seen by Dr. Myrtis Ser and Dr. Arlean Hopping. He had dialysis. Mr. Daubert was very sure he was well enough to go home.   Today he follows up from his hospitalization. He  reports ongoing fatigue, weakness, occasional lightheadedness and dizziness with positional changes. He denies any chest pain however. He is interested in ongoing home health care and is asking for disability. Apparently he is ready on some disability, possibly arrange through nephrology, but he wonders if he can qualify for people to come and clean his house. He has not started any cardiac rehabilitation but would qualify for this. 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  Mr. Shostak returns for followup today. He reports doing fairly well. He denies any chest pain or worsening shortness of breath. His weight is remaining stable on dialysis. He continues to take dual antiplatelet therapy. He will need to remain on this until January of 2016 40 to electively come off that medication if he were to have surgery on his back or his knee, both of which are giving him problems.  PMHx:  Past Medical History  Diagnosis Date  . Anxiety   . Back pain   . GERD (gastroesophageal reflux disease)   . Peripheral neuropathy   . Arthritis   . CHF (congestive heart failure)   . Active smoker   . ESRD on hemodialysis     [name] 4 days per week on M-Tu-Wed and Fri.  Started HD in 1998 and has been on HD initially at [facility], then went to [place] [facility], then in [facility], then to [facility] and now is at [facility] for last 10 years.  Has L thigh AVG.     . Diabetes mellitus     diet controlled  . 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 PONV (postoperative nausea and vomiting)   . Hypertension   . Bell palsy   . Heart murmur   . Carpal tunnel syndrome     bilateral  . Dysrhythmia     Hx: of palpitataions  . Headache(784.0)     Hx: of Migraines    Past Surgical History  Procedure Laterality  Date  . Total hip arthroplasty    . Thyroidectomy    . Tooth extraction    . Mandible fracture surgery    . Dg av dialysis graft declot or    . Insertion of dialysis catheter  03/28/2011    Procedure: INSERTION OF DIALYSIS CATHETER;  Surgeon: Pryor Ochoa, MD;  Location: [facility] OR;  Service: Vascular;  Laterality: Left;  . Av fistula placement  03/31/2011    Procedure: INSERTION OF ARTERIOVENOUS (AV) GORE-TEX GRAFT THIGH;  Surgeon: Sherren Kerns, MD;  Location: [facility] OR;  Service: Vascular;  Laterality: Right;  redo right thigh arteriovenous gortex graft using gore-tex stretch 6mm x 70cm  . Thrombectomy w/ embolectomy  06/02/2011    Procedure: THROMBECTOMY ARTERIOVENOUS GORE-TEX GRAFT;  Surgeon: Chuck Hint, MD;  Location: [facility] OR;  Service: Vascular;  Laterality: Right;  Thrombectomy right thigh arteriovenous gortex graft;  revision by  replacement of large portion of graft with 7mm gore-tex   . Insertion of dialysis catheter  08/28/2011    Procedure: INSERTION OF DIALYSIS CATHETER;  Surgeon: Fransisco Hertz, MD;  Location: [facility] OR;  Service: Vascular;  Laterality: Left;  Atempted Bilateral Internal Jugular, Bilateral Subclavin insertion of 55cm Dialysis Catheter Left Femoral  . Insertion of dialysis catheter  12/15/2011    Procedure: INSERTION OF DIALYSIS CATHETER;  Surgeon: Sherren Kerns, MD;  Location: [facility] OR;  Service: Vascular;  Laterality: Right;  Insertion of Right Femoral Dialysis Catheter  . Exchange of a dialysis catheter  02/26/2012    Procedure: EXCHANGE OF A DIALYSIS CATHETER;  Surgeon: Larina Earthly, MD;  Location: [facility] OR;  Service: Vascular;  Laterality: Right;  . Joint replacement    . Exchange of a dialysis catheter  05/18/2012    Procedure: EXCHANGE OF A DIALYSIS CATHETER;  Surgeon: Larina Earthly, MD;  Location[facility] OR;  Service: Vascular;  Laterality: Right;  right femoral dialysis catheter  . Av fistula placement  05/18/2012    Procedure: INSERTION OF ARTERIOVENOUS (AV) GORE-TEX GRAFT  THIGH;  Surgeon: Larina Earthly, MD;  Location: [facility] OR;  Service: Vascular;  Laterality: Left;  using 6mm by 70cm goretex graft  . Thrombectomy w/ embolectomy Left 08/25/2012    Procedure: THROMBECTOMY ARTERIOVENOUS GORE-TEX GRAFT;  Surgeon: Pryor Ochoa, MD;  Location: [facility] OR;  Service: Vascular;  Laterality: Left;  Attempted thrombectomy of left thigh arteriovenous gortex graft.   . Av fistula placement Left 08/25/2012    Procedure: INSERTION OF ARTERIOVENOUS (AV) GORE-TEX GRAFT THIGH;  Surgeon: Pryor Ochoa, MD;  Location: [facility] OR;  Service: Vascular;  Laterality: Left;  Using 6mm x 40cm vascular Gortex graft.  . Revision of arteriovenous goretex graft Left 12/14/2012    Procedure:  Revision of Left Thigh Graft;  Surgeon: Larina Earthlyodd F Early, MD;  [facility] OR;  Service: Vascular;  Laterality: Left;  . Avgg removal Left 02/16/2013    Procedure: EXCISION OF LEFT ARM ARTERIOVENOUS GORETEX GRAFT TIMES 2 WITH VEIN PATCH ANGIOPLASTY OF BRACIAL ARTERY.  ;  Surgeon: Chuck Hinthristopher S Dickson, MD;  [facility];  Service: Vascular;  Laterality: Left;  Converted from MAC to General.    . Revision of arteriovenous goretex graft Left 08/08/2013    Procedure: REVISION OF LEFT THIGH ARTERIOVENOUS GORETEX GRAFT;  Surgeon: Sherren Kernsharles E Fields, MD;  [facility];  Service: Vascular;  Laterality: Left;    FAMHx:  Family History  Problem Relation Age of Onset  . Diabetes Mother   . Stroke Mother   . Heart disease Mother   . Kidney disease Father   . Hyperlipidemia Father     SOCHx:   reports that he has been smoking Cigarettes.  He has a 40 pack-year smoking history. He has never used smokeless tobacco. He reports that he does not drink alcohol or use illicit drugs.  ALLERGIES:  No Known Allergies  ROS: A comprehensive review of systems was negative except for: Constitutional: positive for fatigue and malaise Respiratory: positive for dyspnea on exertion Cardiovascular: positive for dyspnea and lower extremity  edema Genitourinary: positive for ESRD on HD Musculoskeletal: positive for arthralgias and back pain  HOME MEDS: Current Outpatient Prescriptions  Medication Sig Dispense Refill  . aspirin 81 MG tablet Take 1 tablet (81 mg total) by mouth daily.      Marland Kitchen. atorvastatin (LIPITOR) 80 MG tablet Take 1 tablet (80 mg total) by mouth daily at 6 PM.  30 tablet  11  . b complex-vitamin c-folic acid (NEPHRO-VITE) 0.8 MG TABS Take 0.8 mg by mouth at bedtime.       . calcium carbonate (TUMS - DOSED IN MG ELEMENTAL CALCIUM) 500 MG chewable tablet Chew 3 tablets by mouth 3 (three) times daily.      . colchicine 0.6 MG tablet Take 0.6 mg by mouth 2 (two) times daily as needed. For gout      . esomeprazole (NEXIUM) 40 MG capsule Take 40 mg by mouth every evening.       . gabapentin (NEURONTIN) 100 MG capsule Take 1 capsule by mouth 2 (two) times daily as needed.      . metoprolol tartrate (LOPRESSOR) 25 MG tablet Take 1 tablet (25 mg total) by mouth 2 (two) times daily.  60 tablet  11  . nitroGLYCERIN (NITROSTAT) 0.4 MG SL tablet Place 1 tablet (0.4 mg total) under the tongue every 5 (five) minutes as needed for chest pain.  25 tablet  3  . oxyCODONE (OXY IR/ROXICODONE) 5 MG immediate release tablet Take 1 tablet (5 mg total) by mouth every 6 (six) hours as needed for moderate pain.  30 tablet  0  . prasugrel (EFFIENT) 10 MG TABS tablet Take 1 tablet (10 mg total) by mouth daily.  30 tablet  6  . sodium bicarbonate 650 MG tablet Take 650 mg by mouth 2 (two) times daily.        . sulindac (CLINORIL) 200 MG tablet Take 200 mg by mouth 2 (two) times daily.      . traZODone (DESYREL) 50 MG tablet Take 50 mg by mouth at bedtime as needed for sleep.        No current facility-administered medications for this visit.    LABS/IMAGING: No results found for this  or any previous visit (from the past 48 hour(s)). No results found.  VITALS: BP 138/92  Pulse 100  Ht 5\' 11"  (1.803 m)  Wt 261 lb 11.2 oz (118.706 kg)   BMI 36.52 kg/m2  EXAM: General appearance: alert, appears older than stated age, no distress and moderately obese Neck: no carotid bruit and no JVD Lungs: diminished breath sounds bibasilar Heart: regular rate and rhythm Abdomen: soft, non-tender; bowel sounds normal; no masses,  no organomegaly and obese Extremities: edema 1+ bilatera Pulses: 2+ and symmetric Skin: Skin color, texture, turgor normal. No rashes or lesions Neurologic: Mental status: Alert, oriented, thought content appropriate Psych: Slow, fatigued appearing  EKG: Sinus tachycardia at 100  ASSESSMENT: 1. STEMI status post DES to the LAD, and occluded RCA which may be subacute to chronic (thought to be culprit) 2. ESRD on HD 3. Dyslipidemia 4. Near morbid obesity  PLAN: 1.   Mr. Dorrough has not had recurrent chest pain or shortness of breath. He seems to be doing well on dialysis. He should remain on dual antiplatelet therapy without interruption for elective procedures until at least January 2016. Preferably I would like to keep him on this long-term, and we may be able to switch him from Effient over to Plavix.  Plan to see him back in 6 months or sooner as necessary.  Chrystie Nose, MD, [facility] Attending Cardiologist [facility]  Clary Boulais C 01/25/2014, 2:01 PM

## 2014-01-25 NOTE — Patient Instructions (Signed)
Your physician wants you to follow-up in: 6 months with Dr. Rennis Golden. You will receive a reminder letter in the mail two months in advance. If you don't receive a letter, please call our office to schedule the follow-up appointment.   Please review the medication list provided today and contact our office with an updated list.

## 2014-01-29 ENCOUNTER — Telehealth: Payer: Self-pay | Admitting: Internal Medicine

## 2014-01-29 NOTE — Telephone Encounter (Signed)
Returned call to patient's wife no answer.LMTC. 

## 2014-01-29 NOTE — Telephone Encounter (Signed)
Pt saw Dr Rennis Golden on Thursday,he was suppose to call back and let him know what medicine he is on. Please call,she will give give you this information.

## 2014-01-30 NOTE — Telephone Encounter (Signed)
Spoke with wife. She does not know patient's medications but will have him call back (he is currently at dialysis)

## 2014-02-26 ENCOUNTER — Other Ambulatory Visit: Payer: Self-pay | Admitting: Nephrology

## 2014-02-26 DIAGNOSIS — M542 Cervicalgia: Secondary | ICD-10-CM

## 2014-03-06 ENCOUNTER — Inpatient Hospital Stay: Admission: RE | Admit: 2014-03-06 | Payer: Medicare Other | Source: Ambulatory Visit

## 2014-03-21 ENCOUNTER — Ambulatory Visit: Payer: Medicare Other | Admitting: Family

## 2014-03-25 ENCOUNTER — Ambulatory Visit
Admission: RE | Admit: 2014-03-25 | Discharge: 2014-03-25 | Disposition: A | Discharge status: Home / Self Care | Payer: Medicare Other | Source: Ambulatory Visit | Attending: Nephrology | Admitting: Nephrology

## 2014-03-25 DIAGNOSIS — M542 Cervicalgia: Secondary | ICD-10-CM

## 2014-04-01 ENCOUNTER — Other Ambulatory Visit: Payer: Medicare Other

## 2014-04-05 ENCOUNTER — Encounter (HOSPITAL_COMMUNITY): Payer: Self-pay | Admitting: Surgery

## 2014-05-10 ENCOUNTER — Encounter (HOSPITAL_COMMUNITY): Payer: Self-pay | Admitting: Surgery

## 2014-06-19 ENCOUNTER — Inpatient Hospital Stay (HOSPITAL_COMMUNITY)
Admission: EM | Admit: 2014-06-19 | Discharge: 2014-06-28 | DRG: 551 | Disposition: A | Discharge status: Skilled Nursing Facility | Payer: Medicare Other | Attending: Infectious Disease | Admitting: Infectious Disease

## 2014-06-19 ENCOUNTER — Emergency Department (HOSPITAL_COMMUNITY): Payer: Medicare Other

## 2014-06-19 ENCOUNTER — Observation Stay (HOSPITAL_COMMUNITY): Payer: Medicare Other

## 2014-06-19 ENCOUNTER — Encounter (HOSPITAL_COMMUNITY): Payer: Self-pay | Admitting: Physical Medicine and Rehabilitation

## 2014-06-19 DIAGNOSIS — E1142 Type 2 diabetes mellitus with diabetic polyneuropathy: Secondary | ICD-10-CM | POA: Diagnosis present

## 2014-06-19 DIAGNOSIS — I10 Essential (primary) hypertension: Secondary | ICD-10-CM | POA: Diagnosis present

## 2014-06-19 DIAGNOSIS — Z96649 Presence of unspecified artificial hip joint: Secondary | ICD-10-CM | POA: Diagnosis present

## 2014-06-19 DIAGNOSIS — G43909 Migraine, unspecified, not intractable, without status migrainosus: Secondary | ICD-10-CM | POA: Diagnosis present

## 2014-06-19 DIAGNOSIS — L03116 Cellulitis of left lower limb: Secondary | ICD-10-CM | POA: Insufficient documentation

## 2014-06-19 DIAGNOSIS — M79604 Pain in right leg: Secondary | ICD-10-CM | POA: Diagnosis not present

## 2014-06-19 DIAGNOSIS — Z6836 Body mass index (BMI) 36.0-36.9, adult: Secondary | ICD-10-CM

## 2014-06-19 DIAGNOSIS — I120 Hypertensive chronic kidney disease with stage 5 chronic kidney disease or end stage renal disease: Secondary | ICD-10-CM | POA: Diagnosis present

## 2014-06-19 DIAGNOSIS — M4806 Spinal stenosis, lumbar region: Secondary | ICD-10-CM | POA: Diagnosis not present

## 2014-06-19 DIAGNOSIS — M48061 Spinal stenosis, lumbar region without neurogenic claudication: Secondary | ICD-10-CM | POA: Insufficient documentation

## 2014-06-19 DIAGNOSIS — I252 Old myocardial infarction: Secondary | ICD-10-CM

## 2014-06-19 DIAGNOSIS — E785 Hyperlipidemia, unspecified: Secondary | ICD-10-CM | POA: Diagnosis present

## 2014-06-19 DIAGNOSIS — K219 Gastro-esophageal reflux disease without esophagitis: Secondary | ICD-10-CM | POA: Diagnosis present

## 2014-06-19 DIAGNOSIS — M5126 Other intervertebral disc displacement, lumbar region: Secondary | ICD-10-CM | POA: Diagnosis present

## 2014-06-19 DIAGNOSIS — M5441 Lumbago with sciatica, right side: Secondary | ICD-10-CM | POA: Insufficient documentation

## 2014-06-19 DIAGNOSIS — F419 Anxiety disorder, unspecified: Secondary | ICD-10-CM | POA: Diagnosis present

## 2014-06-19 DIAGNOSIS — G510 Bell's palsy: Secondary | ICD-10-CM | POA: Diagnosis present

## 2014-06-19 DIAGNOSIS — R52 Pain, unspecified: Secondary | ICD-10-CM

## 2014-06-19 DIAGNOSIS — Z7902 Long term (current) use of antithrombotics/antiplatelets: Secondary | ICD-10-CM

## 2014-06-19 DIAGNOSIS — D649 Anemia, unspecified: Secondary | ICD-10-CM | POA: Diagnosis present

## 2014-06-19 DIAGNOSIS — G8929 Other chronic pain: Secondary | ICD-10-CM | POA: Diagnosis present

## 2014-06-19 DIAGNOSIS — E209 Hypoparathyroidism, unspecified: Secondary | ICD-10-CM | POA: Diagnosis present

## 2014-06-19 DIAGNOSIS — M1990 Unspecified osteoarthritis, unspecified site: Secondary | ICD-10-CM | POA: Diagnosis present

## 2014-06-19 DIAGNOSIS — M79609 Pain in unspecified limb: Secondary | ICD-10-CM

## 2014-06-19 DIAGNOSIS — F40240 Claustrophobia: Secondary | ICD-10-CM | POA: Diagnosis present

## 2014-06-19 DIAGNOSIS — L97109 Non-pressure chronic ulcer of unspecified thigh with unspecified severity: Secondary | ICD-10-CM | POA: Diagnosis present

## 2014-06-19 DIAGNOSIS — Z9181 History of falling: Secondary | ICD-10-CM

## 2014-06-19 DIAGNOSIS — I82811 Embolism and thrombosis of superficial veins of right lower extremities: Secondary | ICD-10-CM | POA: Diagnosis present

## 2014-06-19 DIAGNOSIS — F17210 Nicotine dependence, cigarettes, uncomplicated: Secondary | ICD-10-CM | POA: Diagnosis present

## 2014-06-19 DIAGNOSIS — N186 End stage renal disease: Secondary | ICD-10-CM | POA: Diagnosis present

## 2014-06-19 DIAGNOSIS — I82819 Embolism and thrombosis of superficial veins of unspecified lower extremities: Secondary | ICD-10-CM | POA: Insufficient documentation

## 2014-06-19 DIAGNOSIS — Z79891 Long term (current) use of opiate analgesic: Secondary | ICD-10-CM

## 2014-06-19 DIAGNOSIS — E875 Hyperkalemia: Secondary | ICD-10-CM | POA: Diagnosis present

## 2014-06-19 DIAGNOSIS — Z7982 Long term (current) use of aspirin: Secondary | ICD-10-CM

## 2014-06-19 DIAGNOSIS — I2510 Atherosclerotic heart disease of native coronary artery without angina pectoris: Secondary | ICD-10-CM | POA: Diagnosis present

## 2014-06-19 DIAGNOSIS — M5410 Radiculopathy, site unspecified: Secondary | ICD-10-CM

## 2014-06-19 DIAGNOSIS — Z955 Presence of coronary angioplasty implant and graft: Secondary | ICD-10-CM

## 2014-06-19 DIAGNOSIS — Z992 Dependence on renal dialysis: Secondary | ICD-10-CM

## 2014-06-19 DIAGNOSIS — I5032 Chronic diastolic (congestive) heart failure: Secondary | ICD-10-CM | POA: Diagnosis present

## 2014-06-19 DIAGNOSIS — R197 Diarrhea, unspecified: Secondary | ICD-10-CM | POA: Diagnosis present

## 2014-06-19 DIAGNOSIS — N2581 Secondary hyperparathyroidism of renal origin: Secondary | ICD-10-CM | POA: Diagnosis present

## 2014-06-19 DIAGNOSIS — E11649 Type 2 diabetes mellitus with hypoglycemia without coma: Secondary | ICD-10-CM | POA: Diagnosis present

## 2014-06-19 LAB — RENAL FUNCTION PANEL
Albumin: 3 g/dL — ABNORMAL LOW (ref 3.5–5.2)
Anion gap: 22 — ABNORMAL HIGH (ref 5–15)
BUN: 131 mg/dL — AB (ref 6–23)
CALCIUM: 6.2 mg/dL — AB (ref 8.4–10.5)
CO2: 19 mmol/L (ref 19–32)
CREATININE: 16.01 mg/dL — AB (ref 0.50–1.35)
Chloride: 96 mmol/L (ref 96–112)
GFR calc Af Amer: 3 mL/min — ABNORMAL LOW (ref >90)
GFR calc non Af Amer: 3 mL/min — ABNORMAL LOW (ref >90)
Glucose, Bld: 120 mg/dL — ABNORMAL HIGH (ref 70–99)
PHOSPHORUS: 12 mg/dL — AB (ref 2.3–4.6)
Potassium: 5.8 mmol/L — ABNORMAL HIGH (ref 3.5–5.1)
Sodium: 137 mmol/L (ref 135–145)

## 2014-06-19 LAB — HEPATIC FUNCTION PANEL
ALK PHOS: 49 U/L (ref 39–117)
ALT: 16 U/L (ref 0–53)
AST: 28 U/L (ref 0–37)
Albumin: 3 g/dL — ABNORMAL LOW (ref 3.5–5.2)
Total Bilirubin: 1 mg/dL (ref 0.3–1.2)
Total Protein: 6.9 g/dL (ref 6.0–8.3)

## 2014-06-19 LAB — I-STAT CHEM 8, ED
BUN: 124 mg/dL — AB (ref 6–23)
Calcium, Ion: 0.68 mmol/L — ABNORMAL LOW (ref 1.12–1.23)
Chloride: 99 mmol/L (ref 96–112)
Creatinine, Ser: 15 mg/dL — ABNORMAL HIGH (ref 0.50–1.35)
Glucose, Bld: 68 mg/dL — ABNORMAL LOW (ref 70–99)
HCT: 50 % (ref 39.0–52.0)
HEMOGLOBIN: 17 g/dL (ref 13.0–17.0)
POTASSIUM: 6.6 mmol/L — AB (ref 3.5–5.1)
SODIUM: 133 mmol/L — AB (ref 135–145)
TCO2: 17 mmol/L (ref 0–100)

## 2014-06-19 LAB — CBC WITH DIFFERENTIAL/PLATELET
Basophils Absolute: 0 10*3/uL (ref 0.0–0.1)
Basophils Relative: 0 % (ref 0–1)
EOS PCT: 2 % (ref 0–5)
Eosinophils Absolute: 0.4 10*3/uL (ref 0.0–0.7)
HCT: 43.8 % (ref 39.0–52.0)
Hemoglobin: 14.9 g/dL (ref 13.0–17.0)
LYMPHS ABS: 1.8 10*3/uL (ref 0.7–4.0)
Lymphocytes Relative: 10 % — ABNORMAL LOW (ref 12–46)
MCH: 31.4 pg (ref 26.0–34.0)
MCHC: 34 g/dL (ref 30.0–36.0)
MCV: 92.2 fL (ref 78.0–100.0)
Monocytes Absolute: 2 10*3/uL — ABNORMAL HIGH (ref 0.1–1.0)
Monocytes Relative: 11 % (ref 3–12)
NEUTROS ABS: 13.7 10*3/uL — AB (ref 1.7–7.7)
Neutrophils Relative %: 77 % (ref 43–77)
Platelets: 190 10*3/uL (ref 150–400)
RBC: 4.75 MIL/uL (ref 4.22–5.81)
RDW: 16.2 % — AB (ref 11.5–15.5)
WBC: 17.9 10*3/uL — ABNORMAL HIGH (ref 4.0–10.5)

## 2014-06-19 LAB — GLUCOSE, CAPILLARY: Glucose-Capillary: 123 mg/dL — ABNORMAL HIGH (ref 70–99)

## 2014-06-19 LAB — CBG MONITORING, ED
GLUCOSE-CAPILLARY: 70 mg/dL (ref 70–99)
GLUCOSE-CAPILLARY: 128 mg/dL — AB (ref 70–99)
Glucose-Capillary: 62 mg/dL — ABNORMAL LOW (ref 70–99)
Glucose-Capillary: 65 mg/dL — ABNORMAL LOW (ref 70–99)
Glucose-Capillary: 92 mg/dL (ref 70–99)

## 2014-06-19 MED ORDER — DEXTROSE 50 % IV SOLN
1.0000 | Freq: Once | INTRAVENOUS | Status: AC
  Administered 2014-06-19: 50 mL via INTRAVENOUS
  Filled 2014-06-19: qty 50

## 2014-06-19 MED ORDER — TICAGRELOR 90 MG PO TABS
90.0000 mg | ORAL_TABLET | Freq: Two times a day (BID) | ORAL | Status: DC
  Administered 2014-06-19 – 2014-06-28 (×18): 90 mg via ORAL
  Filled 2014-06-19 (×19): qty 1

## 2014-06-19 MED ORDER — OXYCODONE-ACETAMINOPHEN 5-325 MG PO TABS
1.0000 | ORAL_TABLET | Freq: Four times a day (QID) | ORAL | Status: DC | PRN
  Administered 2014-06-19 – 2014-06-20 (×3): 1 via ORAL
  Filled 2014-06-19 (×3): qty 1

## 2014-06-19 MED ORDER — ASPIRIN EC 81 MG PO TBEC
81.0000 mg | DELAYED_RELEASE_TABLET | Freq: Every day | ORAL | Status: DC
  Administered 2014-06-19 – 2014-06-21 (×3): 81 mg via ORAL
  Filled 2014-06-19 (×3): qty 1

## 2014-06-19 MED ORDER — ATORVASTATIN CALCIUM 80 MG PO TABS
80.0000 mg | ORAL_TABLET | Freq: Every day | ORAL | Status: DC
  Administered 2014-06-20 – 2014-06-27 (×8): 80 mg via ORAL
  Filled 2014-06-19 (×9): qty 1

## 2014-06-19 MED ORDER — INSULIN ASPART 100 UNIT/ML ~~LOC~~ SOLN
5.0000 [IU] | Freq: Once | SUBCUTANEOUS | Status: AC
  Administered 2014-06-19: 5 [IU] via INTRAVENOUS
  Filled 2014-06-19: qty 1

## 2014-06-19 MED ORDER — CALCIUM CARBONATE ANTACID 500 MG PO CHEW
3.0000 | CHEWABLE_TABLET | Freq: Three times a day (TID) | ORAL | Status: DC
  Administered 2014-06-19 – 2014-06-28 (×26): 600 mg via ORAL
  Filled 2014-06-19 (×3): qty 3
  Filled 2014-06-19: qty 2
  Filled 2014-06-19 (×2): qty 3
  Filled 2014-06-19: qty 2
  Filled 2014-06-19 (×3): qty 3
  Filled 2014-06-19: qty 2
  Filled 2014-06-19 (×4): qty 3
  Filled 2014-06-19: qty 2
  Filled 2014-06-19 (×15): qty 3

## 2014-06-19 MED ORDER — METOPROLOL TARTRATE 25 MG PO TABS
25.0000 mg | ORAL_TABLET | Freq: Two times a day (BID) | ORAL | Status: DC
  Administered 2014-06-19 – 2014-06-28 (×17): 25 mg via ORAL
  Filled 2014-06-19 (×19): qty 1

## 2014-06-19 MED ORDER — HEPARIN SODIUM (PORCINE) 5000 UNIT/ML IJ SOLN
5000.0000 [IU] | Freq: Three times a day (TID) | INTRAMUSCULAR | Status: DC
  Administered 2014-06-19 – 2014-06-28 (×23): 5000 [IU] via SUBCUTANEOUS
  Filled 2014-06-19 (×29): qty 1

## 2014-06-19 MED ORDER — GLUCAGON HCL RDNA (DIAGNOSTIC) 1 MG IJ SOLR
1.0000 mg | Freq: Once | INTRAMUSCULAR | Status: AC
  Administered 2014-06-19: 1 mg via INTRAMUSCULAR
  Filled 2014-06-19: qty 1

## 2014-06-19 MED ORDER — SODIUM BICARBONATE 650 MG PO TABS
650.0000 mg | ORAL_TABLET | Freq: Two times a day (BID) | ORAL | Status: DC
  Administered 2014-06-19 – 2014-06-25 (×11): 650 mg via ORAL
  Filled 2014-06-19 (×13): qty 1

## 2014-06-19 MED ORDER — DEXTROSE 50 % IV SOLN
1.0000 | Freq: Once | INTRAVENOUS | Status: AC
Start: 2014-06-19 — End: 2014-06-19
  Administered 2014-06-19: 50 mL via INTRAVENOUS
  Filled 2014-06-19: qty 50

## 2014-06-19 MED ORDER — GABAPENTIN 100 MG PO CAPS
100.0000 mg | ORAL_CAPSULE | Freq: Two times a day (BID) | ORAL | Status: DC
  Administered 2014-06-19 – 2014-06-28 (×18): 100 mg via ORAL
  Filled 2014-06-19 (×21): qty 1

## 2014-06-19 MED ORDER — SODIUM CHLORIDE 0.9 % IJ SOLN
3.0000 mL | Freq: Two times a day (BID) | INTRAMUSCULAR | Status: DC
  Administered 2014-06-19 – 2014-06-27 (×11): 3 mL via INTRAVENOUS

## 2014-06-19 MED ORDER — SODIUM POLYSTYRENE SULFONATE 15 GM/60ML PO SUSP
30.0000 g | Freq: Once | ORAL | Status: AC
  Administered 2014-06-19: 30 g via ORAL
  Filled 2014-06-19: qty 120

## 2014-06-19 MED ORDER — SODIUM CHLORIDE 0.9 % IV SOLN
1.0000 g | Freq: Once | INTRAVENOUS | Status: AC
  Administered 2014-06-19: 1 g via INTRAVENOUS
  Filled 2014-06-19: qty 10

## 2014-06-19 MED ORDER — RENA-VITE PO TABS
1.0000 | ORAL_TABLET | Freq: Every day | ORAL | Status: DC
  Administered 2014-06-19 – 2014-06-27 (×9): 1 via ORAL
  Filled 2014-06-19 (×10): qty 1

## 2014-06-19 MED ORDER — SODIUM CHLORIDE 0.9 % IJ SOLN
3.0000 mL | Freq: Two times a day (BID) | INTRAMUSCULAR | Status: DC
  Administered 2014-06-20 – 2014-06-25 (×9): 3 mL via INTRAVENOUS

## 2014-06-19 MED ORDER — PANTOPRAZOLE SODIUM 40 MG PO TBEC
80.0000 mg | DELAYED_RELEASE_TABLET | Freq: Every day | ORAL | Status: DC
  Administered 2014-06-20 – 2014-06-28 (×9): 80 mg via ORAL
  Filled 2014-06-19 (×9): qty 2

## 2014-06-19 NOTE — ED Notes (Signed)
CRITICAL VALUE ALERT  Critical value received:  Calcium 6.1  Date of notification:  06/19/14  Time of notification:  1643  Critical value read back:Yes.    Nurse who received alert:  Gershon Mussel   MD notified (1st page):  Jon Gills, EDP  Time of first page:  [phone]  Responding MD:  Jon Gills, EDP

## 2014-06-19 NOTE — ED Provider Notes (Signed)
Patient care assumed from Dr. Hyman Hopes at 17:59, please see Dr. Marland Mcalpine not for full details.   Briefly, the patient is a 59 yo M with PMHx of ESRD on HD via L AVF, CAD s/p PCI, HTN who presents with back, R leg pain, and need for HD with HyperK. Patient has been temporized with insulin, dextrose, kayexalate. Medicine consulted and will admit. Nephrology also consulted but awaiting call back. VSS, EKG without hyperK changes.   Bed ready. Still awaiting Nephrology consultation. Discussed with Medicine who is aware and will consult Nephrology as inpatient. VSS, pt temporized. Tolerating PO for relative low glucose 2/2 insulin, now stabilized. Will admit to floor.  Clinical Impression: 1. Hyperkalemia   2. Radicular leg pain   3. Pain     Disposition: Admit  Condition: Stable  Pt seen in conjunction with Dr. De Burrs, MD 06/20/14 1714  Vida Roller, MD 06/21/14 [phone]

## 2014-06-19 NOTE — ED Provider Notes (Signed)
CSN: [id number]     Arrival date & time 06/19/14  1139 History   First MD Initiated Contact with Patient 06/19/14 1151     Chief Complaint  Patient presents with  . Vascular Access Problem   (Consider location/radiation/quality/duration/timing/severity/associated sxs/prior Treatment) HPI Comments: 59 yo M hx of ESRD on HD M, T, Th, F, through L femoral AVF, CAD s/p PCI, HTN, presents with CC of back, R leg pain.  Pt states he has been having pain for the last 6 months, worst in the last month.  He c/o R low back pain, radiating down to R knee.  Worse with sitting position.  Pt states pain comes on strong during dialysis sessions, to the point of being unable to bear a full dialysis sessions.  He states over the last month his dialysis sessions have decreased to 1-3 hours because of discomfort.  Pt endorses a fall a month ago, and a fall yesterday while trying to get out of a car, falling onto his R side.  Denies lasting injury from this.  In last few days he also has had diarrhea, and even woke up with diarrhea one night, and has been wearing depends because of this.  He denies bowel incontinence however.  He does not make urine.  Pt denies fever, chills, paresthesias, focal deficit.  He states he is able to ambulate without difficulty.  Pt has had failed AVF in BUE, and RLE.  Currently receives dialysis through L AVF which he has had for a year.    The history is provided by the patient. No language interpreter was used.    Past Medical History  Diagnosis Date  . Anxiety   . Back pain   . GERD (gastroesophageal reflux disease)   . Peripheral neuropathy   . Arthritis   . CHF (congestive heart failure)   . Active smoker   . ESRD on hemodialysis     [name] 4 days per week on M-Tu-Wed and Fri.  Started HD in 1998 and has been on HD initially at [facility], then went to [facility], then in [facility], then to [facility] and now is at [facility] for last 10 years.  Has L thigh AVG.     .  Diabetes mellitus     diet controlled  . 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 PONV (postoperative nausea and vomiting)   . Hypertension   . Bell palsy   . Heart murmur   . Carpal tunnel syndrome     bilateral  . Dysrhythmia     Hx: of palpitataions  . Headache(784.0)     Hx: of Migraines   Past Surgical History  Procedure Laterality Date  . Total hip arthroplasty    . Thyroidectomy    . Tooth extraction    . Mandible fracture surgery    . Dg av dialysis graft declot or    . Insertion of dialysis catheter  03/28/2011    Procedure: INSERTION OF DIALYSIS CATHETER;  Surgeon: Pryor Ochoa, MD;  Location: [facility] OR;  Service: Vascular;  Laterality: Left;  . Av fistula placement  03/31/2011    Procedure: INSERTION OF ARTERIOVENOUS (AV) GORE-TEX GRAFT THIGH;  Surgeon: Sherren Kerns, MD;  Location: [facility] OR;  Service: Vascular;  Laterality: Right;  redo right thigh arteriovenous gortex graft using gore-tex stretch 26mm x 70cm  . Thrombectomy w/ embolectomy  06/02/2011    Procedure: THROMBECTOMY  ARTERIOVENOUS GORE-TEX GRAFT;  Surgeon: Chuck Hint, MD;  Location: [facility] OR;  Service: Vascular;  Laterality: Right;  Thrombectomy right thigh arteriovenous gortex graft;  revision by  replacement of large portion of graft with 7mm gore-tex   . Insertion of dialysis catheter  08/28/2011    Procedure: INSERTION OF DIALYSIS CATHETER;  Surgeon: Fransisco Hertz, MD;  Location: [facility] OR;  Service: Vascular;  Laterality: Left;  Atempted Bilateral Internal Jugular, Bilateral Subclavin insertion of 55cm Dialysis Catheter Left Femoral  . Insertion of dialysis catheter  12/15/2011    Procedure: INSERTION OF DIALYSIS CATHETER;  Surgeon: Sherren Kerns, MD;  Location: [facility] OR;  Service: Vascular;  Laterality: Right;  Insertion of Right Femoral Dialysis Catheter  . Exchange of a dialysis catheter  02/26/2012    Procedure: EXCHANGE OF A DIALYSIS CATHETER;  Surgeon: Larina Earthly, MD;  Location: [facility] OR;   Service: Vascular;  Laterality: Right;  . Joint replacement    . Exchange of a dialysis catheter  05/18/2012    Procedure: EXCHANGE OF A DIALYSIS CATHETER;  Surgeon: Larina Earthly, MD;  Location: [facility] OR;  Service: Vascular;  Laterality: Right;  right femoral dialysis catheter  . Av fistula placement  05/18/2012    Procedure: INSERTION OF ARTERIOVENOUS (AV) GORE-TEX GRAFT THIGH;  Surgeon: Larina Earthly, MD;  Location: [facility] OR;  Service: Vascular;  Laterality: Left;  using 6mm by 70cm goretex graft  . Thrombectomy w/ embolectomy Left 08/25/2012    Procedure: THROMBECTOMY ARTERIOVENOUS GORE-TEX GRAFT;  Surgeon: Pryor Ochoa, MD;  Location: [facility] OR;  Service: Vascular;  Laterality: Left;  Attempted thrombectomy of left thigh arteriovenous gortex graft.   . Av fistula placement Left 08/25/2012    Procedure: INSERTION OF ARTERIOVENOUS (AV) GORE-TEX GRAFT THIGH;  Surgeon: Pryor Ochoa, MD;  Location: [facility] OR;  Service: Vascular;  Laterality: Left;  Using 6mm x 40cm vascular Gortex graft.  . Revision of arteriovenous goretex graft Left 12/14/2012    Procedure: Revision of Left Thigh Graft;  Surgeon: Larina Earthly, MD;  Location: [facility] OR;  Service: Vascular;  Laterality: Left;  . Avgg removal Left 02/16/2013    Procedure: EXCISION OF LEFT ARM ARTERIOVENOUS GORETEX GRAFT TIMES 2 WITH VEIN PATCH ANGIOPLASTY OF BRACIAL ARTERY.  ;  Surgeon: Chuck Hint, MD;  Location: [facility] OR;  Service: Vascular;  Laterality: Left;  Converted from MAC to General.    . Revision of arteriovenous goretex graft Left 08/08/2013    Procedure: REVISION OF LEFT THIGH ARTERIOVENOUS GORETEX GRAFT;  Surgeon: Sherren Kerns, MD;  Location: [facility] OR;  Service: Vascular;  Laterality: Left;  . Venogram Bilateral 10/27/2011    Procedure: VENOGRAM;  Surgeon: Nada Libman, MD;  Location: [facility] CATH LAB;  Service: Cardiovascular;  Laterality: Bilateral;  bilat upper extrem venograms  . Left heart cath Bilateral 05/21/2013    Procedure: LEFT HEART CATH;   Surgeon: Iran Ouch, MD;  Location: [facility] CATH LAB;  Service: Cardiovascular;  Laterality: Bilateral;  . Percutaneous coronary stent intervention (pci-s)  05/21/2013    Procedure: PERCUTANEOUS CORONARY STENT INTERVENTION (PCI-S);  Surgeon: Iran Ouch, MD;  Location: [facility] CATH LAB;  Service: Cardiovascular;;   Family History  Problem Relation Age of Onset  . Diabetes Mother   . Stroke Mother   . Heart disease Mother   . Kidney disease Father   . Hyperlipidemia Father    History  Substance Use Topics  . Smoking status: Current Every Day Smoker --  1.00 packs/day for 40 years    Types: Cigarettes  . Smokeless tobacco: Never Used     Comment: pt states that he wants to quit "1 cigarette every now and then" - 06/08/13, currently smoking 1/2 ppd (01/25/14)  . Alcohol Use: No    Review of Systems  Constitutional: Negative for fever and chills.  Respiratory: Negative for cough and shortness of breath.   Cardiovascular: Negative for chest pain.  Gastrointestinal: Positive for diarrhea. Negative for nausea, vomiting and abdominal pain.  Genitourinary: Negative for dysuria.  Musculoskeletal: Positive for back pain and arthralgias. Negative for myalgias.  Skin: Negative for rash.  Neurological: Negative for dizziness, weakness, light-headedness, numbness and headaches.  Hematological: Negative for adenopathy. Does not bruise/bleed easily.  All other systems reviewed and are negative.     Allergies  Review of patient's allergies indicates no known allergies.  Home Medications   Prior to Admission medications   Medication Sig Start Date End Date Taking? Authorizing Provider  aspirin 81 MG tablet Take 1 tablet (81 mg total) by mouth daily. 05/24/13   Rhonda G Barrett, PA-C  atorvastatin (LIPITOR) 80 MG tablet Take 1 tablet (80 mg total) by mouth daily at 6 PM. 05/24/13   Rhonda G Barrett, PA-C  b complex-vitamin c-folic acid (NEPHRO-VITE) 0.8 MG TABS Take 0.8 mg by mouth at bedtime.      Historical Provider, MD  calcium carbonate (TUMS - DOSED IN MG ELEMENTAL CALCIUM) 500 MG chewable tablet Chew 3 tablets by mouth 3 (three) times daily.    Historical Provider, MD  colchicine 0.6 MG tablet Take 0.6 mg by mouth 2 (two) times daily as needed. For gout    Historical Provider, MD  esomeprazole (NEXIUM) 40 MG capsule Take 40 mg by mouth every evening.     Historical Provider, MD  gabapentin (NEURONTIN) 100 MG capsule Take 1 capsule by mouth 2 (two) times daily as needed. 12/29/13   Historical Provider, MD  metoprolol tartrate (LOPRESSOR) 25 MG tablet Take 1 tablet (25 mg total) by mouth 2 (two) times daily. 05/24/13   Rhonda G Barrett, PA-C  nitroGLYCERIN (NITROSTAT) 0.4 MG SL tablet Place 1 tablet (0.4 mg total) under the tongue every 5 (five) minutes as needed for chest pain. 05/24/13   Rhonda G Barrett, PA-C  oxyCODONE (OXY IR/ROXICODONE) 5 MG immediate release tablet Take 1 tablet (5 mg total) by mouth every 6 (six) hours as needed for moderate pain. 08/08/13   Samantha J Rhyne, PA-C  prasugrel (EFFIENT) 10 MG TABS tablet Take 1 tablet (10 mg total) by mouth daily. 06/16/13   Chrystie Nose, MD  sodium bicarbonate 650 MG tablet Take 650 mg by mouth 2 (two) times daily.      Historical Provider, MD  sulindac (CLINORIL) 200 MG tablet Take 200 mg by mouth 2 (two) times daily.    Historical Provider, MD  traZODone (DESYREL) 50 MG tablet Take 50 mg by mouth at bedtime as needed for sleep.  03/06/13   Historical Provider, MD   BP 137/77 mmHg  Pulse 72  Temp(Src) 97.5 F (36.4 C) (Oral)  Resp 18  SpO2 100% Physical Exam  Constitutional: He is oriented to person, place, and time. He appears well-developed and well-nourished.  HENT:  Head: Normocephalic and atraumatic.  Right Ear: External ear normal.  Left Ear: External ear normal.  Mouth/Throat: Oropharynx is clear and moist.  Eyes: Conjunctivae and EOM are normal. Pupils are equal, round, and reactive to light.  Neck: Normal range  of  motion. Neck supple.  Cardiovascular: Normal rate, regular rhythm, normal heart sounds and intact distal pulses.   2+ femoral pulses, 1+ DP pulses.  L AVF palpable with good thrill.    Pulmonary/Chest: Breath sounds normal. No respiratory distress. He has no wheezes. He has no rales. He exhibits no tenderness.  Abdominal: Soft. Bowel sounds are normal. He exhibits no distension. There is no tenderness. There is no rebound and no guarding.  Genitourinary:  Intact rectal tone and sensation.   Musculoskeletal: Normal range of motion. He exhibits tenderness.  R lumbar spine TTP.  No step off or deformity.  Negative straight leg test bilaterally.    Neurological: He is alert and oriented to person, place, and time.  CN II-XII intact.  No focal motor or sensory deficit to BLE, BUE.    Skin: Skin is warm and dry.  Nursing note and vitals reviewed.   ED Course  Procedures (including critical care time) Labs Review Labs Reviewed  RENAL FUNCTION PANEL - Abnormal; Notable for the following:    Potassium 5.8 (*)    Glucose, Bld 120 (*)    BUN 131 (*)    Creatinine, Ser 16.01 (*)    Calcium 6.2 (*)    Phosphorus 12.0 (*)    Albumin 3.0 (*)    GFR calc non Af Amer 3 (*)    GFR calc Af Amer 3 (*)    Anion gap 22 (*)    All other components within normal limits  I-STAT CHEM 8, ED - Abnormal; Notable for the following:    Sodium 133 (*)    Potassium 6.6 (*)    BUN 124 (*)    Creatinine, Ser 15.00 (*)    Glucose, Bld 68 (*)    Calcium, Ion 0.68 (*)    All other components within normal limits  CBG MONITORING, ED - Abnormal; Notable for the following:    Glucose-Capillary 62 (*)    All other components within normal limits  CBG MONITORING, ED - Abnormal; Notable for the following:    Glucose-Capillary 128 (*)    All other components within normal limits  CBG MONITORING, ED - Abnormal; Notable for the following:    Glucose-Capillary 65 (*)    All other components within normal limits   CLOSTRIDIUM DIFFICILE BY PCR  CBC WITH DIFFERENTIAL/PLATELET  HEPATIC FUNCTION PANEL  CBG MONITORING, ED    Imaging Review Dg Chest Portable 1 View  06/19/2014   CLINICAL DATA:  Vascular access problem.  Shortness of breath.  EXAM: PORTABLE CHEST - 1 VIEW  COMPARISON:  May 23, 2013.  FINDINGS: The heart size and mediastinal contours are within normal limits. No pneumothorax or plural effusion is noted. Stable mild central pulmonary vascular congestion is noted. No acute pulmonary disease is noted. The visualized skeletal structures are unremarkable.  IMPRESSION: Stable mild central pulmonary vascular congestion. No other significant abnormality seen in the chest.   Electronically Signed   By: Lupita Raider, M.D.   On: 06/19/2014 13:34     EKG Interpretation   Date/Time:  Tuesday June 19 2014 12:27:23 EST Ventricular Rate:  73 PR Interval:  183 QRS Duration: 109 QT Interval:  444 QTC Calculation: 489 R Axis:   69 Text Interpretation:  Sinus rhythm Borderline low voltage, extremity leads  Borderline prolonged QT interval since last tracing no significant change  Abnormal ekg Confirmed by Hyacinth Meeker  MD, BRIAN ([id number]) on 06/19/2014 12:29:35  PM      MDM  Final diagnoses:  Hyperkalemia  Radicular leg pain   59 yo M hx of ESRD on HD M, T, Th, F, through L femoral AVF, CAD s/p PCI, HTN, presents with CC of back, R leg pain.  Physical exam as above.  VS WNL.  EKG without significant change from prior.  Pt with elevated K 6.6, elevated BUN/Cr 124/15, elevated from prior.    Pt to be given insulin, dextrose, kayexalate.  No need for calcium gluconate given normal EKG.  Will obtain venous duplex R leg to r/o DVT as cause of patient's pain.  Will reeval.  1:34 PM  Pt became hypoglycemic with insulin to 62.  Pt's IV infiltrated.  Given IM glucagon, and new IV established, and then given D50.  Calcium low on labs, will give Calcium Gluconate.   Medicine consulted for admission  for hyperkalemia, need for dialysis.  Venous duplex still waiting to be done, here now.  Glucose repeat 65, pt eating, but will also give another D50 as insulin still circulating.  Will contact Nephrology about setting up dialysis for patient.   Pt understands and agrees with plan.   Signout to Dr. Penne Lash 5:49 PM  Awaiting Nephrology call to set up dialysis.    Jon Gills  Discussed pt with attending Dr. Hyacinth Meeker.    Jon Gills, MD 06/19/14 1750  Vida Roller, MD 06/19/14 2127

## 2014-06-19 NOTE — ED Notes (Signed)
Pt brought b ack to room via wheelchair with visitor in tow; pt getting undressed and into a gown at this time; Dreama, RN present in room

## 2014-06-19 NOTE — ED Notes (Signed)
Pt presents to department requesting hemodialysis. States he receives treatments on Monday, Tuesday, Wednesday and Thursday. Unable to receive treatment yesterday due to pain all over body. 10/10 generalized pain upon arrival to ED. Pt is alert and oriented x4. HD catheter to L upper leg.

## 2014-06-19 NOTE — ED Notes (Signed)
Chem 8 results given to Dr. Miller 

## 2014-06-19 NOTE — ED Notes (Signed)
Admitting team at bedside.

## 2014-06-19 NOTE — Progress Notes (Signed)
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 Bed in lowest position.  Yellow bracelet on.  Non-skid socks on.  Bed alarm on. [address] Orientation: Patient oriented to staff, room, and unit. Family: friend and family at bedside  Orders have been reviewed and implemented. Admission nurse attempted to complete admission questions; however, patient was too sleepy to continue.  Will defer to morning.  Will continue to monitor.  Rozann Lesches, RN, BSN

## 2014-06-19 NOTE — ED Notes (Signed)
EKG delayed due to lead failure. Switched out leads and continuing to try to capture EKG.

## 2014-06-19 NOTE — ED Notes (Signed)
Notified Attending MD that pt's CBG 65. Verbal order to give pt food. Pt is alert and oriented.

## 2014-06-19 NOTE — ED Notes (Signed)
Pt had large BM. Changed pt and linens

## 2014-06-19 NOTE — ED Notes (Signed)
Vascular at bedside performing ultrasound.

## 2014-06-19 NOTE — ED Notes (Signed)
Patient blood sugar level 62. EDP ordered dextrose. Pt IV infiltrated, gave 1mg  of glucagon IM. EDP at bedside placing ultra sound IV.

## 2014-06-19 NOTE — H&P (Signed)
Date: 06/19/2014               Patient Name:  [name] MRN: [id number]  DOB: 1956/01/09 Age / Sex: 59 y.o., male   PCP: Dyke Maes, MD         Medical Service: Internal Medicine Teaching Service         Attending Physician: Dr. Juliet Rude. Rubin Payor, MD    First Contact: Dr. Gara Kroner Pager: [phone]  Second Contact: Dr. Evelena Peat Pager: [phone]       After Hours (After 5p/  First Contact Pager: [phone]  weekends / holidays): Second Contact Pager: [phone]   Chief Complaint: rt lower back pain and rt leg pain  History of Present Illness: Pt is a 59 y/o male w/ PMHx of ESRD, DM, anxiety, chronic back pain, HTN, and HLD who presents with rt lower back pain and rt leg pain. Pt states has had lower back pain and rt leg pain x 3 months that has steadily gotten worse. He only notices rt lower back and rt leg during HD. Pain is constant during HD and resolves when he lays down after HD. He states once he feels pain he notices it in his lower back and rt leg, he denies shooting pain from back down leg.  He was unable to complete HD yesterday due to pain and thus HD was cut short. He is on a M, T, W, Th schedule and was suppose to get HD today but decided to come to the ED for dialysis b/c he cannot tolerate HD at his previous center due to lower back and rt leg pain. He has baseline chronic lower back pain that he takes oxycodone for which is prescribed to him by his PCP Dr. Briant Cedar. He is able to ambulate with his walker. He denies lower extremity swelling or weakness. He does have numbness of his 1st toe on his right foot s/p toe nail removal that is chronic.   On admission to the ED, pt's potassium was 6.6 and pt was given insulin, dextrose, and kayexalate. Pt smokes 1PPD x 30 years. Denies alcohol or illicit drug use.     Meds: Current Facility-Administered Medications  Medication Dose Route Frequency Provider Last Rate Last Dose  . calcium gluconate 1 g in sodium  chloride 0.9 % 100 mL IVPB  1 g Intravenous Once Jon Gills, MD 330 mL/hr at 06/19/14 1756 1 g at 06/19/14 1756   Current Outpatient Prescriptions  Medication Sig Dispense Refill  . aspirin 81 MG tablet Take 1 tablet (81 mg total) by mouth daily.    Marland Kitchen atorvastatin (LIPITOR) 80 MG tablet Take 1 tablet (80 mg total) by mouth daily at 6 PM. 30 tablet 11  . b complex-vitamin c-folic acid (NEPHRO-VITE) 0.8 MG TABS Take 0.8 mg by mouth at bedtime.     . calcium carbonate (TUMS - DOSED IN MG ELEMENTAL CALCIUM) 500 MG chewable tablet Chew 3 tablets by mouth 3 (three) times daily.    . colchicine 0.6 MG tablet Take 0.6 mg by mouth 2 (two) times daily as needed. For gout    . esomeprazole (NEXIUM) 40 MG capsule Take 40 mg by mouth every evening.     . gabapentin (NEURONTIN) 100 MG capsule Take 1 capsule by mouth 2 (two) times daily as needed.    . loperamide (IMODIUM) 2 MG capsule Take 4 mg by mouth as needed for diarrhea or loose stools.    . metoprolol tartrate (LOPRESSOR)  25 MG tablet Take 1 tablet (25 mg total) by mouth 2 (two) times daily. (Patient taking differently: Take 25 mg by mouth daily. ) 60 tablet 11  . naproxen sodium (ANAPROX) 220 MG tablet Take 440 mg by mouth 2 (two) times daily with a meal.    . oxyCODONE (OXY IR/ROXICODONE) 5 MG immediate release tablet Take 1 tablet (5 mg total) by mouth every 6 (six) hours as needed for moderate pain. 30 tablet 0  . ticagrelor (BRILINTA) 90 MG TABS tablet Take 90 mg by mouth 2 (two) times daily.    . traZODone (DESYREL) 50 MG tablet Take 50 mg by mouth at bedtime as needed for sleep.     . cephALEXin (KEFLEX) 500 MG capsule Take 500 mg by mouth every 8 (eight) hours.    . nitroGLYCERIN (NITROSTAT) 0.4 MG SL tablet Place 1 tablet (0.4 mg total) under the tongue every 5 (five) minutes as needed for chest pain. 25 tablet 3  . prasugrel (EFFIENT) 10 MG TABS tablet Take 1 tablet (10 mg total) by mouth daily. (Patient not taking: Reported on 06/19/2014) 30  tablet 6  . sodium bicarbonate 650 MG tablet Take 650 mg by mouth 2 (two) times daily.      . sulindac (CLINORIL) 200 MG tablet Take 200 mg by mouth 2 (two) times daily.      Allergies: Allergies as of 06/19/2014  . (No Known Allergies)   Past Medical History  Diagnosis Date  . Anxiety   . Back pain   . GERD (gastroesophageal reflux disease)   . Peripheral neuropathy   . Arthritis   . CHF (congestive heart failure)   . Active smoker   . ESRD on hemodialysis     [name] 4 days per week on M-Tu-Wed and Fri.  Started HD in 1998 and has been on HD initially at [facility], then went to [facility], then in [facility] then to [facility] and now is at [facility] for last 10 years.  Has L thigh AVG.     . Diabetes mellitus     diet controlled  . 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 PONV (postoperative nausea and vomiting)   . Hypertension   . Bell palsy   . Heart murmur   . Carpal tunnel syndrome     bilateral  . Dysrhythmia     Hx: of palpitataions  . Headache(784.0)     Hx: of Migraines   Past Surgical History  Procedure Laterality Date  . Total hip arthroplasty    . Thyroidectomy    . Tooth extraction    . Mandible fracture surgery    . Dg av dialysis graft declot or    . Insertion of dialysis catheter  03/28/2011    Procedure: INSERTION OF DIALYSIS CATHETER;  Surgeon: Pryor Ochoa, MD;  Location: [facility] OR;  Service: Vascular;  Laterality: Left;  . Av fistula placement  03/31/2011    Procedure: INSERTION OF ARTERIOVENOUS (AV) GORE-TEX GRAFT THIGH;  Surgeon: Sherren Kerns, MD;  Location: [facility] OR;  Service: Vascular;  Laterality: Right;  redo right thigh arteriovenous gortex graft using gore-tex stretch 6mm x 70cm  . Thrombectomy w/ embolectomy  06/02/2011    Procedure: THROMBECTOMY ARTERIOVENOUS GORE-TEX GRAFT;  Surgeon: Chuck Hint, MD;  Location: [facility] OR;  Service: Vascular;  Laterality: Right;  Thrombectomy right thigh arteriovenous gortex graft;   revision by  replacement of  large portion of graft with 7mm gore-tex   . Insertion of dialysis catheter  08/28/2011    Procedure: INSERTION OF DIALYSIS CATHETER;  Surgeon: Fransisco Hertz, MD;  Location: [facility] OR;  Service: Vascular;  Laterality: Left;  Atempted Bilateral Internal Jugular, Bilateral Subclavin insertion of 55cm Dialysis Catheter Left Femoral  . Insertion of dialysis catheter  12/15/2011    Procedure: INSERTION OF DIALYSIS CATHETER;  Surgeon: Sherren Kerns, MD;  Location: [facility] OR;  Service: Vascular;  Laterality: Right;  Insertion of Right Femoral Dialysis Catheter  . Exchange of a dialysis catheter  02/26/2012    Procedure: EXCHANGE OF A DIALYSIS CATHETER;  Surgeon: Larina Earthly, MD;  Location: [facility] OR;  Service: Vascular;  Laterality: Right;  . Joint replacement    . Exchange of a dialysis catheter  05/18/2012    Procedure: EXCHANGE OF A DIALYSIS CATHETER;  Surgeon: Larina Earthly, MD;  Location: [facility] OR;  Service: Vascular;  Laterality: Right;  right femoral dialysis catheter  . Av fistula placement  05/18/2012    Procedure: INSERTION OF ARTERIOVENOUS (AV) GORE-TEX GRAFT THIGH;  Surgeon: Larina Earthly, MD;  Location: [facility] OR;  Service: Vascular;  Laterality: Left;  using 6mm by 70cm goretex graft  . Thrombectomy w/ embolectomy Left 08/25/2012    Procedure: THROMBECTOMY ARTERIOVENOUS GORE-TEX GRAFT;  Surgeon: Pryor Ochoa, MD;  Location: [facility] OR;  Service: Vascular;  Laterality: Left;  Attempted thrombectomy of left thigh arteriovenous gortex graft.   . Av fistula placement Left 08/25/2012    Procedure: INSERTION OF ARTERIOVENOUS (AV) GORE-TEX GRAFT THIGH;  Surgeon: Pryor Ochoa, MD;  Location: [facility] OR;  Service: Vascular;  Laterality: Left;  Using 6mm x 40cm vascular Gortex graft.  . Revision of arteriovenous goretex graft Left 12/14/2012    Procedure: Revision of Left Thigh Graft;  Surgeon: Larina Earthly, MD;  Location: [facility] OR;  Service: Vascular;  Laterality: Left;  . Avgg removal Left 02/16/2013     Procedure: EXCISION OF LEFT ARM ARTERIOVENOUS GORETEX GRAFT TIMES 2 WITH VEIN PATCH ANGIOPLASTY OF BRACIAL ARTERY.  ;  Surgeon: Chuck Hint, MD;  Location: [facility] OR;  Service: Vascular;  Laterality: Left;  Converted from MAC to General.    . Revision of arteriovenous goretex graft Left 08/08/2013    Procedure: REVISION OF LEFT THIGH ARTERIOVENOUS GORETEX GRAFT;  Surgeon: Sherren Kerns, MD;  Location: [facility] OR;  Service: Vascular;  Laterality: Left;  . Venogram Bilateral 10/27/2011    Procedure: VENOGRAM;  Surgeon: Nada Libman, MD;  Location: [facility] CATH LAB;  Service: Cardiovascular;  Laterality: Bilateral;  bilat upper extrem venograms  . Left heart cath Bilateral 05/21/2013    Procedure: LEFT HEART CATH;  Surgeon: Iran Ouch, MD;  Location: [facility] CATH LAB;  Service: Cardiovascular;  Laterality: Bilateral;  . Percutaneous coronary stent intervention (pci-s)  05/21/2013    Procedure: PERCUTANEOUS CORONARY STENT INTERVENTION (PCI-S);  Surgeon: Iran Ouch, MD;  Location: [facility] CATH LAB;  Service: Cardiovascular;;   Family History  Problem Relation Age of Onset  . Diabetes Mother   . Stroke Mother   . Heart disease Mother   . Kidney disease Father   . Hyperlipidemia Father    History   Social History  . Marital Status: Single    Spouse Name: N/A  . Number of Children: N/A  . Years of Education: N/A   Occupational History  . Not on file.   Social History Main Topics  . Smoking status:  Current Every Day Smoker -- 1.00 packs/day for 40 years    Types: Cigarettes  . Smokeless tobacco: Never Used     Comment: pt states that he wants to quit "1 cigarette every now and then" - 06/08/13, currently smoking 1/2 ppd (01/25/14)  . Alcohol Use: No  . Drug Use: No  . Sexual Activity: No   Other Topics Concern  . Not on file   Social History Narrative    Review of Systems: Neg for vomiting, fever, NS, chills, fever, sick contacts, chest pain, chest palpitations, diarrhea. Positive  for nausea. Has SOB that is at baseline. Frontal HA that he gets on occasion.   Physical Exam: Blood pressure 145/81, pulse 75, temperature 97.5 F (36.4 C), temperature source Oral, resp. rate 14, SpO2 92 %.  General: NAD, obese HEENT: muddy sclera, pupils reactive b/l, moist mucous membranes Lungs: CTAB, no wheezing Cardiac: distant heart sounds 2/2 body habitus GI: soft, active bowel sounds, non tender to palpation MSK: neg for spinal tenderness Neuro: CN II-XII grossly intact, 4/5 strength on LLE, intact sensation of LE b/l Ext: no pedal edema  Lab results: Basic Metabolic Panel:  Recent Labs  16/10/96 1317 06/19/14 1546  NA 133* 137  K 6.6* 5.8*  CL 99 96  CO2  --  19  GLUCOSE 68* 120*  BUN 124* 131*  CREATININE 15.00* 16.01*  CALCIUM  --  6.2*  PHOS  --  12.0*   Liver Function Tests:  Recent Labs  06/19/14 1546  ALBUMIN 3.0*   CBC:  Recent Labs  06/19/14 1317  HGB 17.0  HCT 50.0    Recent Labs  06/19/14 1434 06/19/14 1502 06/19/14 1549 06/19/14 1720  GLUCAP 62* 70 128* 65*    Imaging results:  Dg Chest Portable 1 View  06/19/2014   CLINICAL DATA:  Vascular access problem.  Shortness of breath.  EXAM: PORTABLE CHEST - 1 VIEW  COMPARISON:  May 23, 2013.  FINDINGS: The heart size and mediastinal contours are within normal limits. No pneumothorax or plural effusion is noted. Stable mild central pulmonary vascular congestion is noted. No acute pulmonary disease is noted. The visualized skeletal structures are unremarkable.  IMPRESSION: Stable mild central pulmonary vascular congestion. No other significant abnormality seen in the chest.   Electronically Signed   By: Lupita Raider, M.D.   On: 06/19/2014 13:34    Other results: EKG: normal sinus rhythm, peaked T waves in leads V3-V6, QRS interval   Assessment & Plan by Problem: Active Problems:   End-stage renal disease on hemodialysis   HTN (hypertension)   Right leg pain  Hyperkalemia w/  ESRD--2/2 short HD yesterday and missing HD today. Pt on [facility] schedule. potassium 6.6 on admission. EKG w/ peaked T waves compared to last EKG on 01/26/14. Pt given insulin, dextrose, and kayexalate in the ED. Repeat BMET revealed potassium of 5.8.  - consulted nephrology in the ED for HD, spoke with Dr. Marisue Humble w/ Nephrology who will take pt to dialysis tomorrow morning - continued home TUMS and renal MVI - repeat EKG tomorrow morning  Lower back pain and rt leg pain-- lower back pain is chronic, however pt has progressively worsened lower rt back pain and rt leg pain only during HD. Hx of hip arthroplasty, will need to get medical records in the morning. Pt was suppose to get a MRI of his back but never followed up to get it.  - holding home oxycodone  q6h prn for pain -  lumbar spine xray and xray of rt hip ordered - LE dopplers ordered in the ED - PT consulted  Hx of STEMI--in 07/2013. cath revealed 3 vessel disease but he was not a candidate for emergent CABG. He had a DES to the LAD. He had an occluded RCA that could not be fixed.  - holding home nitro  - continue home asa 81mg  and brilinta.   HTN - continue home lopressor 25mg  BID   HLD - continue home lipitor 80mg   Anxiety - holding home trazadone   DM with peripheral neuropathy-- diabetes is diet controlled. HbA1c 6.3 on 05/2010 - CBGs ACHS - cont home gabapentin 100mg  BID  GERD - cont home nexium  FEN - renal/carb mod diet - hyperkalemia as above  DVT ppx-- brilinta  Dispo: Disposition is deferred at this time, awaiting improvement of current medical problems. Anticipated discharge in approximately 1-2 day(s).   The patient does have a current PCP Dyke Maes, MD) and does not need an [facility] hospital follow-up appointment after discharge.  The patient does not have transportation limitations that hinder transportation to clinic appointments.  Signed: Gara Kroner, MD 06/19/2014, 6:09 PM

## 2014-06-20 ENCOUNTER — Inpatient Hospital Stay (HOSPITAL_COMMUNITY): Payer: Medicare Other

## 2014-06-20 ENCOUNTER — Encounter (HOSPITAL_COMMUNITY): Payer: Self-pay | Admitting: General Practice

## 2014-06-20 DIAGNOSIS — Z7982 Long term (current) use of aspirin: Secondary | ICD-10-CM

## 2014-06-20 DIAGNOSIS — Z992 Dependence on renal dialysis: Secondary | ICD-10-CM | POA: Diagnosis not present

## 2014-06-20 DIAGNOSIS — Z7902 Long term (current) use of antithrombotics/antiplatelets: Secondary | ICD-10-CM | POA: Diagnosis not present

## 2014-06-20 DIAGNOSIS — E1122 Type 2 diabetes mellitus with diabetic chronic kidney disease: Secondary | ICD-10-CM

## 2014-06-20 DIAGNOSIS — L97109 Non-pressure chronic ulcer of unspecified thigh with unspecified severity: Secondary | ICD-10-CM | POA: Diagnosis present

## 2014-06-20 DIAGNOSIS — Z955 Presence of coronary angioplasty implant and graft: Secondary | ICD-10-CM | POA: Diagnosis not present

## 2014-06-20 DIAGNOSIS — I252 Old myocardial infarction: Secondary | ICD-10-CM | POA: Diagnosis not present

## 2014-06-20 DIAGNOSIS — K219 Gastro-esophageal reflux disease without esophagitis: Secondary | ICD-10-CM

## 2014-06-20 DIAGNOSIS — F419 Anxiety disorder, unspecified: Secondary | ICD-10-CM

## 2014-06-20 DIAGNOSIS — G510 Bell's palsy: Secondary | ICD-10-CM | POA: Diagnosis present

## 2014-06-20 DIAGNOSIS — E785 Hyperlipidemia, unspecified: Secondary | ICD-10-CM | POA: Diagnosis present

## 2014-06-20 DIAGNOSIS — E209 Hypoparathyroidism, unspecified: Secondary | ICD-10-CM | POA: Diagnosis present

## 2014-06-20 DIAGNOSIS — F17210 Nicotine dependence, cigarettes, uncomplicated: Secondary | ICD-10-CM | POA: Diagnosis present

## 2014-06-20 DIAGNOSIS — E1140 Type 2 diabetes mellitus with diabetic neuropathy, unspecified: Secondary | ICD-10-CM

## 2014-06-20 DIAGNOSIS — G43909 Migraine, unspecified, not intractable, without status migrainosus: Secondary | ICD-10-CM | POA: Diagnosis present

## 2014-06-20 DIAGNOSIS — M5441 Lumbago with sciatica, right side: Secondary | ICD-10-CM | POA: Diagnosis present

## 2014-06-20 DIAGNOSIS — M5126 Other intervertebral disc displacement, lumbar region: Secondary | ICD-10-CM | POA: Diagnosis present

## 2014-06-20 DIAGNOSIS — N2581 Secondary hyperparathyroidism of renal origin: Secondary | ICD-10-CM | POA: Diagnosis present

## 2014-06-20 DIAGNOSIS — M545 Low back pain: Secondary | ICD-10-CM

## 2014-06-20 DIAGNOSIS — I120 Hypertensive chronic kidney disease with stage 5 chronic kidney disease or end stage renal disease: Secondary | ICD-10-CM | POA: Diagnosis present

## 2014-06-20 DIAGNOSIS — Z79891 Long term (current) use of opiate analgesic: Secondary | ICD-10-CM | POA: Diagnosis not present

## 2014-06-20 DIAGNOSIS — E875 Hyperkalemia: Secondary | ICD-10-CM | POA: Diagnosis present

## 2014-06-20 DIAGNOSIS — M4806 Spinal stenosis, lumbar region: Secondary | ICD-10-CM | POA: Diagnosis present

## 2014-06-20 DIAGNOSIS — Z6836 Body mass index (BMI) 36.0-36.9, adult: Secondary | ICD-10-CM | POA: Diagnosis not present

## 2014-06-20 DIAGNOSIS — R197 Diarrhea, unspecified: Secondary | ICD-10-CM | POA: Diagnosis present

## 2014-06-20 DIAGNOSIS — N186 End stage renal disease: Secondary | ICD-10-CM | POA: Diagnosis present

## 2014-06-20 DIAGNOSIS — M79604 Pain in right leg: Secondary | ICD-10-CM | POA: Diagnosis present

## 2014-06-20 DIAGNOSIS — G8929 Other chronic pain: Secondary | ICD-10-CM

## 2014-06-20 DIAGNOSIS — E1142 Type 2 diabetes mellitus with diabetic polyneuropathy: Secondary | ICD-10-CM | POA: Diagnosis present

## 2014-06-20 DIAGNOSIS — M1990 Unspecified osteoarthritis, unspecified site: Secondary | ICD-10-CM | POA: Diagnosis present

## 2014-06-20 DIAGNOSIS — F40240 Claustrophobia: Secondary | ICD-10-CM | POA: Diagnosis present

## 2014-06-20 DIAGNOSIS — E11649 Type 2 diabetes mellitus with hypoglycemia without coma: Secondary | ICD-10-CM | POA: Diagnosis present

## 2014-06-20 DIAGNOSIS — E876 Hypokalemia: Secondary | ICD-10-CM

## 2014-06-20 DIAGNOSIS — T82868A Thrombosis of vascular prosthetic devices, implants and grafts, initial encounter: Secondary | ICD-10-CM | POA: Diagnosis not present

## 2014-06-20 DIAGNOSIS — I2510 Atherosclerotic heart disease of native coronary artery without angina pectoris: Secondary | ICD-10-CM | POA: Diagnosis present

## 2014-06-20 DIAGNOSIS — D649 Anemia, unspecified: Secondary | ICD-10-CM | POA: Diagnosis present

## 2014-06-20 DIAGNOSIS — Z96649 Presence of unspecified artificial hip joint: Secondary | ICD-10-CM | POA: Diagnosis present

## 2014-06-20 DIAGNOSIS — Z9181 History of falling: Secondary | ICD-10-CM | POA: Diagnosis not present

## 2014-06-20 DIAGNOSIS — I5032 Chronic diastolic (congestive) heart failure: Secondary | ICD-10-CM | POA: Diagnosis present

## 2014-06-20 DIAGNOSIS — I82811 Embolism and thrombosis of superficial veins of right lower extremities: Secondary | ICD-10-CM | POA: Diagnosis present

## 2014-06-20 LAB — RENAL FUNCTION PANEL
Albumin: 2.9 g/dL — ABNORMAL LOW (ref 3.5–5.2)
Albumin: 3 g/dL — ABNORMAL LOW (ref 3.5–5.2)
Anion gap: 27 — ABNORMAL HIGH (ref 5–15)
Anion gap: 18 — ABNORMAL HIGH (ref 5–15)
BUN: 140 mg/dL — AB (ref 6–23)
BUN: 60 mg/dL — ABNORMAL HIGH (ref 6–23)
CHLORIDE: 94 mmol/L — AB (ref 96–112)
CO2: 16 mmol/L — AB (ref 19–32)
CO2: 24 mmol/L (ref 19–32)
CREATININE: 16.03 mg/dL — AB (ref 0.50–1.35)
Calcium: 6.2 mg/dL — CL (ref 8.4–10.5)
Calcium: 7.8 mg/dL — ABNORMAL LOW (ref 8.4–10.5)
Chloride: 96 mmol/L (ref 96–112)
Creatinine, Ser: 9.77 mg/dL — ABNORMAL HIGH (ref 0.50–1.35)
GFR calc Af Amer: 3 mL/min — ABNORMAL LOW (ref >90)
GFR calc Af Amer: 6 mL/min — ABNORMAL LOW (ref >90)
GFR calc non Af Amer: 3 mL/min — ABNORMAL LOW (ref >90)
GFR calc non Af Amer: 5 mL/min — ABNORMAL LOW (ref >90)
GLUCOSE: 128 mg/dL — AB (ref 70–99)
Glucose, Bld: 72 mg/dL (ref 70–99)
PHOSPHORUS: 7.9 mg/dL — AB (ref 2.3–4.6)
POTASSIUM: 7 mmol/L — AB (ref 3.5–5.1)
Phosphorus: 14.4 mg/dL — ABNORMAL HIGH (ref 2.3–4.6)
Potassium: 4.2 mmol/L (ref 3.5–5.1)
SODIUM: 138 mmol/L (ref 135–145)
Sodium: 137 mmol/L (ref 135–145)

## 2014-06-20 LAB — CLOSTRIDIUM DIFFICILE BY PCR: CDIFFPCR: NEGATIVE

## 2014-06-20 LAB — CBC
HCT: 42.1 % (ref 39.0–52.0)
HEMATOCRIT: 41.2 % (ref 39.0–52.0)
HEMOGLOBIN: 13.8 g/dL (ref 13.0–17.0)
Hemoglobin: 14.3 g/dL (ref 13.0–17.0)
MCH: 31 pg (ref 26.0–34.0)
MCH: 30.6 pg (ref 26.0–34.0)
MCHC: 33.5 g/dL (ref 30.0–36.0)
MCHC: 34 g/dL (ref 30.0–36.0)
MCV: 92.6 fL (ref 78.0–100.0)
MCV: 90 fL (ref 78.0–100.0)
PLATELETS: 161 10*3/uL (ref 150–400)
Platelets: 201 10*3/uL (ref 150–400)
RBC: 4.45 MIL/uL (ref 4.22–5.81)
RBC: 4.68 MIL/uL (ref 4.22–5.81)
RDW: 16.3 % — ABNORMAL HIGH (ref 11.5–15.5)
RDW: 16.1 % — ABNORMAL HIGH (ref 11.5–15.5)
WBC: 15.2 10*3/uL — ABNORMAL HIGH (ref 4.0–10.5)
WBC: 11.8 10*3/uL — AB (ref 4.0–10.5)

## 2014-06-20 LAB — GLUCOSE, CAPILLARY
GLUCOSE-CAPILLARY: 118 mg/dL — AB (ref 70–99)
Glucose-Capillary: 74 mg/dL (ref 70–99)
Glucose-Capillary: 107 mg/dL — ABNORMAL HIGH (ref 70–99)
Glucose-Capillary: 147 mg/dL — ABNORMAL HIGH (ref 70–99)

## 2014-06-20 LAB — MRSA PCR SCREENING: MRSA BY PCR: NEGATIVE

## 2014-06-20 MED ORDER — OXYCODONE-ACETAMINOPHEN 5-325 MG PO TABS
1.0000 | ORAL_TABLET | Freq: Once | ORAL | Status: AC
  Administered 2014-06-20: 1 via ORAL
  Filled 2014-06-20: qty 1

## 2014-06-20 MED ORDER — HYDROMORPHONE HCL 1 MG/ML IJ SOLN
INTRAMUSCULAR | Status: AC
  Filled 2014-06-20: qty 1

## 2014-06-20 MED ORDER — HYDROMORPHONE HCL 1 MG/ML IJ SOLN
1.0000 mg | INTRAMUSCULAR | Status: AC
Start: 2014-06-20 — End: 2014-06-20
  Administered 2014-06-20 (×2): 1 mg via INTRAVENOUS

## 2014-06-20 MED ORDER — OXYCODONE HCL 5 MG PO TABS
5.0000 mg | ORAL_TABLET | Freq: Four times a day (QID) | ORAL | Status: DC | PRN
  Administered 2014-06-21 – 2014-06-22 (×4): 5 mg via ORAL
  Filled 2014-06-20 (×4): qty 1

## 2014-06-20 MED ORDER — LORAZEPAM 1 MG PO TABS: 1.0000 mg | ORAL_TABLET | Freq: Once | ORAL | Status: AC | PRN

## 2014-06-20 NOTE — Progress Notes (Signed)
Subjective: Pt denies current back or leg pain. Denies chest pain or palpitations. Denies diarrhea.  Objective: Vital signs in last 24 hours: Filed Vitals:   06/20/14 0927 06/20/14 0932 06/20/14 1000 06/20/14 1030  BP: 142/36 122/39 149/69 154/38  Pulse: 71 75 72 67  Temp: 97.6 F (36.4 C)     TempSrc: Oral     Resp: Weight: 277 lb 9 oz (125.9 kg)     SpO2: 98%      Weight change:   Intake/Output Summary (Last 24 hours) at 06/20/14 1103 Last data filed at 06/20/14 0607  Gross per 24 hour  Intake    483 ml  Output      0 ml  Net    483 ml   General: NAD, laying in bed comfortably Lungs: CTAB, no wheezing Cardiac: RRR, no murmurs GI: soft, active bowel sounds Neuro: CN II-XII grossly intact Ext: neg for pedal edema MSK: full range of motion of rt hip, difficulty with external rotation of left hip, neg straight leg test b/l, non tender to palpation of rt hip.    Lab Results: Basic Metabolic Panel:  Recent Labs Lab 06/19/14 1546 06/20/14 0545  NA 137 137  K 5.8* 7.0*  CL 96 94*  CO2 19 16*  GLUCOSE 120* 72  BUN 131* 140*  CREATININE 16.01* 16.03*  CALCIUM 6.2* 6.2*  PHOS 12.0* 14.4*   Liver Function Tests:  Recent Labs Lab 06/19/14 1546 06/20/14 0545  AST 28  --   ALT 16  --   ALKPHOS 49  --   BILITOT 1.0  --   PROT 6.9  --   ALBUMIN 3.0*  3.0* 2.9*   CBC:  Recent Labs Lab 06/19/14 1307 06/19/14 1317 06/20/14 0545  WBC 17.9*  --  15.2*  NEUTROABS 13.7*  --   --   HGB 14.9 17.0 13.8  HCT 43.8 50.0 41.2  MCV 92.2  --  92.6  PLT 190  --  201   CBG:  Recent Labs Lab 06/19/14 1502 06/19/14 1549 06/19/14 1720 06/19/14 1841 06/19/14 2013 06/20/14 0802  GLUCAP 70 128* 65* 92 123* 74    Studies/Results: Dg Lumbar Spine 2-3 Views  06/19/2014   CLINICAL DATA:  Low back pain for 3 days.  No known injury.  EXAM: LUMBAR SPINE - 2-3 VIEW  COMPARISON:  09/19/2013  FINDINGS: Degenerative changes in the lumbar spine at with  narrowed lumbar interspaces and associated endplate hypertrophic changes. Prominent degenerative changes at the lumbosacral interspace, similar to prior study. No vertebral compression deformities. No focal bone lesion or bone destruction. Extensive vascular calcifications. Vascular calcifications and calcification in the vas deferens.  IMPRESSION: Degenerative changes in the lumbar spine. No significant change since prior study.   Electronically Signed   By: Burman Nieves M.D.   On: 06/19/2014 21:51   Dg Chest Portable 1 View  06/19/2014   CLINICAL DATA:  Vascular access problem.  Shortness of breath.  EXAM: PORTABLE CHEST - 1 VIEW  COMPARISON:  May 23, 2013.  FINDINGS: The heart size and mediastinal contours are within normal limits. No pneumothorax or plural effusion is noted. Stable mild central pulmonary vascular congestion is noted. No acute pulmonary disease is noted. The visualized skeletal structures are unremarkable.  IMPRESSION: Stable mild central pulmonary vascular congestion. No other significant abnormality seen in the chest.   Electronically Signed   By: Lupita Raider, M.D.   On: 06/19/2014 13:34  Dg Hip Unilat With Pelvis 2-3 Views Right  06/19/2014   CLINICAL DATA:  Right hip pain for 3 days. History of right hip fracture. No new injury.  EXAM: RIGHT HIP (WITH PELVIS) 2-3 VIEWS  COMPARISON:  None.  FINDINGS: Degenerative changes in the lower lumbar spine and both hips. Postoperative changes with 4 screws fixing the right femoral neck. Surgical hardware appears intact. No residual or recurrent fracture demonstrated in the right hip. Proximal left hip deformity. This may be due to old fracture deformity. Angulation of the head of precludes evaluation of the femoral neck on the left. Pelvis appears intact. No displaced fractures. SI joints and symphysis pubis are not displaced. Ligamentous calcification along the right iliac crest. Diffuse bone demineralization.  IMPRESSION: No  definite acute fracture demonstrated. Postoperative changes in the right hip. Nonspecific deformity of the left hip possibly due to old fracture deformity. Deformity limits evaluation of the left hip.   Electronically Signed   By: Burman Nieves M.D.   On: 06/19/2014 21:50   Medications: I have reviewed the patient's current medications. Scheduled Meds: . aspirin  81 mg Oral Daily  . atorvastatin  80 mg Oral q1800  . calcium carbonate  3 tablet Oral TID  . gabapentin  100 mg Oral BID  . heparin subcutaneous  5,000 Units Subcutaneous 3 times per day  . HYDROmorphone      .  HYDROmorphone (DILAUDID) injection  1 mg Intravenous Q1 Hr x 3  . metoprolol tartrate  25 mg Oral BID  . multivitamin  1 tablet Oral QHS  . pantoprazole  80 mg Oral Q1200  . sodium bicarbonate  650 mg Oral BID  . sodium chloride  3 mL Intravenous Q12H  . sodium chloride  3 mL Intravenous Q12H  . ticagrelor  90 mg Oral BID   Continuous Infusions:  PRN Meds:.oxyCODONE-acetaminophen Assessment/Plan: Principal Problem:   Right leg pain Active Problems:   End-stage renal disease on hemodialysis   HTN (hypertension)   Hyperkalemia-- potassium 7.0 this morning, EKG with no significant changes from yesterday - HD today - will recheck BMET after HD to ensure hyperkalemia has resolved.   ESRD-- on [facility] schedule - HD today and possibly tomorrow  - continue home TUMS and renal MVI  Chronic lower back pain with worsening of rt lower back and rt leg pain-- lower back pain is chronic due to degenerative changes which he takes oxycodone for. Lumbar spine xray revealed degenerative changes and xray of rt hip neg for for acute changes. - holding home oxycodone  q6h prn for pain, can resume if needed - LE dopplers ordered in the ED - PT consulted - per nephrology may need pre HD bolus of short acting pain mes with or without long acting pain meds for baseline since narcotics are drawn off with HD.  - MRI lumbar spine  as inpatient can give ativan if anxious, unable to go through larger MRI machine in patient due to hx of stent - if MRI lumbar spine neg can place ortho referral at outpatient  Hx of STEMI--in 07/2013. cath revealed 3 vessel disease but he was not a candidate for emergent CABG. He had a DES to the LAD. He had an occluded RCA that could not be fixed.  - holding home nitro  - continue home asa  and brilinta.   HTN - continue home lopressor  BID   HLD - continue home lipitor   Anxiety - holding home trazadone   DM  with peripheral neuropathy-- diabetes is diet controlled. HbA1c 6.3 on 05/2010 - CBGs ACHS - cont home gabapentin 100mg  BID  GERD - cont home nexium  FEN - renal/carb mod diet  DVT ppx-- brilinta  Dispo:  Anticipated discharge today or tomorrow.   The patient does have a current PCP Dyke Maes, MD) and does not need an [facility] hospital follow-up appointment after discharge.  The patient does not have transportation limitations that hinder transportation to clinic appointments. .Services Needed at time of discharge: Y = Yes, Blank = No PT:   OT:   RN:   Equipment:   Other:       Gara Kroner, MD 06/20/2014, 11:03 AM

## 2014-06-20 NOTE — Consult Note (Signed)
Renal Service Consult Note [facility]  [name] 06/20/2014 [name] D Requesting Physician:  Dr. Meredith Pel  Reason for Consult:  ESRD patient with hip and back pain , high K and missed HD HPI: The patient is a 59 y.o. year-old with hx of ESRD on HD x 18 years, hep B, tobacco, DJD, HTN and chronic back pain. Presented to ED with pain in back and RLE preventing him from completing HD treatments.  K was up , rec'd acute pharm Rx in ED, and this am K back up to 7.0.  No SOB, CP, cough, no abd pain, n/v/d. Makes all his treatments but for the last several months, increasing pain in his back shooting down the R leg have made it so he can't stay on for full sessions. He says pain starts "about 1 1/2 hours in my treatments".  He saw on ortho MD recently and had spinal injection which didn't improve his situation.  He is concerned about dialysis and wants to know what can be done for this pain.  Chart review: 6/06 - abd pain d/t mesenteric/ omental inflamm process, resolved with abx/ conservative care; left hip pain d/t degenerated femoral head, ESRD on HD, anemia, hx hep C 2/07 - severe back pain,, hx of marked multilevel stenosis by CT in 2006 > admitted, neuro eval, suspected osteomyelitis, aspirate cultures negative, so empirically treated with IV vanc / cipro.  ESRD on HD, R fem AVG graft T/R 3/07 - placement of new R thigh AVG 2/12 - Klebs/ EColi bacteremia, unclear source; clotted fem AVG s/p thrombolysis. ESRD. Hx osteomyelitis of lumbar spine "chronic". Chronic pain. Unclear cause of bacteremia, Klebs and EColi grew from blood cx's and both were sensitive to cipro so pt was treated with abx to complete 14 day course.  12/12 - recurrent thrombosis of R thigh AVG, in place x 7 years > insertion of new R thigh access 1/14 - pt with R thigh HD cath for new left thigh AVG > underwent new L thigh AVG placement 1/15 - CP, STEMI, ^trop > went directly to cath lab w results > 3V  disease, not felt to be candidate for emergent CABG, underwent DES to LAD; PCI to RCA was attempted but vessel was occluded and could not be crossed. CFX had high-grade stenosis and would be treated medically. Sedation problems due to chronic pain. Anesthesia had to assist and propofol was used for sedation.  Basal hypoK on ventriculogram, prob RV involvemnet, ECHO w poor images.  4/15 - excess bleeding after HD from AVG > admitted, VVS inspected AVF and took to OR where underwent revision of the left thigh graft for bleeding.  DC'd home stable.    ROS  no ha, no rash  no sore throat  no dysuria, makes very little urine  no confusion   no CP  Past Medical History  Past Medical History  Diagnosis Date  . Anxiety   . Back pain   . GERD (gastroesophageal reflux disease)   . Peripheral neuropathy   . Arthritis   . CHF (congestive heart failure)   . Active smoker   . ESRD on hemodialysis     [name] 4 days per week on M-Tu-Wed and Fri.  Started HD in 1998 and has been on HD initially at [facility], then went to [facility], then in [facility], then to [facility] and now is at [facility] for last 10 years.  Has L thigh AVG.     Marland Kitchen  Diabetes mellitus     diet controlled  . 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 PONV (postoperative nausea and vomiting)   . Hypertension   . Bell palsy   . Heart murmur   . Carpal tunnel syndrome     bilateral  . Dysrhythmia     Hx: of palpitataions  . Headache(784.0)     Hx: of Migraines   Past Surgical History  Past Surgical History  Procedure Laterality Date  . Total hip arthroplasty    . Thyroidectomy    . Tooth extraction    . Mandible fracture surgery    . Dg av dialysis graft declot or    . Insertion of dialysis catheter  03/28/2011    Procedure: INSERTION OF DIALYSIS CATHETER;  Surgeon: Pryor Ochoa, MD;  Location: [facility] OR;  Service: Vascular;  Laterality: Left;  . Av fistula placement  03/31/2011    Procedure: INSERTION OF  ARTERIOVENOUS (AV) GORE-TEX GRAFT THIGH;  Surgeon: Sherren Kerns, MD;  Location: [facility] OR;  Service: Vascular;  Laterality: Right;  redo right thigh arteriovenous gortex graft using gore-tex stretch 6mm x 70cm  . Thrombectomy w/ embolectomy  06/02/2011    Procedure: THROMBECTOMY ARTERIOVENOUS GORE-TEX GRAFT;  Surgeon: Chuck Hint, MD;  Location: [facility] OR;  Service: Vascular;  Laterality: Right;  Thrombectomy right thigh arteriovenous gortex graft;  revision by  replacement of large portion of graft with 7mm gore-tex   . Insertion of dialysis catheter  08/28/2011    Procedure: INSERTION OF DIALYSIS CATHETER;  Surgeon: Fransisco Hertz, MD;  Location: [facility] OR;  Service: Vascular;  Laterality: Left;  Atempted Bilateral Internal Jugular, Bilateral Subclavin insertion of 55cm Dialysis Catheter Left Femoral  . Insertion of dialysis catheter  12/15/2011    Procedure: INSERTION OF DIALYSIS CATHETER;  Surgeon: Sherren Kerns, MD;  Location: [facility] OR;  Service: Vascular;  Laterality: Right;  Insertion of Right Femoral Dialysis Catheter  . Exchange of a dialysis catheter  02/26/2012    Procedure: EXCHANGE OF A DIALYSIS CATHETER;  Surgeon: Larina Earthly, MD;  Location: [facility] OR;  Service: Vascular;  Laterality: Right;  . Joint replacement    . Exchange of a dialysis catheter  05/18/2012    Procedure: EXCHANGE OF A DIALYSIS CATHETER;  Surgeon: Larina Earthly, MD;  Location: [facility] OR;  Service: Vascular;  Laterality: Right;  right femoral dialysis catheter  . Av fistula placement  05/18/2012    Procedure: INSERTION OF ARTERIOVENOUS (AV) GORE-TEX GRAFT THIGH;  Surgeon: Larina Earthly, MD;  Location: [facility] OR;  Service: Vascular;  Laterality: Left;  using 6mm by 70cm goretex graft  . Thrombectomy w/ embolectomy Left 08/25/2012    Procedure: THROMBECTOMY ARTERIOVENOUS GORE-TEX GRAFT;  Surgeon: Pryor Ochoa, MD;  Location: [facility] OR;  Service: Vascular;  Laterality: Left;  Attempted thrombectomy of left thigh arteriovenous gortex graft.   . Av  fistula placement Left 08/25/2012    Procedure: INSERTION OF ARTERIOVENOUS (AV) GORE-TEX GRAFT THIGH;  Surgeon: Pryor Ochoa, MD;  Location: [facility] OR;  Service: Vascular;  Laterality: Left;  Using 6mm x 40cm vascular Gortex graft.  . Revision of arteriovenous goretex graft Left 12/14/2012    Procedure: Revision of Left Thigh Graft;  Surgeon: Larina Earthly, MD;  Location: [facility] OR;  Service: Vascular;  Laterality: Left;  . Avgg removal Left 02/16/2013    Procedure: EXCISION OF LEFT ARM ARTERIOVENOUS GORETEX GRAFT TIMES 2 WITH VEIN  PATCH ANGIOPLASTY OF BRACIAL ARTERY.  ;  Surgeon: Chuck Hint, MD;  Location: [facility] OR;  Service: Vascular;  Laterality: Left;  Converted from MAC to General.    . Revision of arteriovenous goretex graft Left 08/08/2013    Procedure: REVISION OF LEFT THIGH ARTERIOVENOUS GORETEX GRAFT;  Surgeon: Sherren Kerns, MD;  Location: [facility] OR;  Service: Vascular;  Laterality: Left;  . Venogram Bilateral 10/27/2011    Procedure: VENOGRAM;  Surgeon: Nada Libman, MD;  Location: [facility] CATH LAB;  Service: Cardiovascular;  Laterality: Bilateral;  bilat upper extrem venograms  . Left heart cath Bilateral 05/21/2013    Procedure: LEFT HEART CATH;  Surgeon: Iran Ouch, MD;  Location: [facility] CATH LAB;  Service: Cardiovascular;  Laterality: Bilateral;  . Percutaneous coronary stent intervention (pci-s)  05/21/2013    Procedure: PERCUTANEOUS CORONARY STENT INTERVENTION (PCI-S);  Surgeon: Iran Ouch, MD;  Location: [facility] CATH LAB;  Service: Cardiovascular;;  . Coronary angioplasty with stent placement     Family History  Family History  Problem Relation Age of Onset  . Diabetes Mother   . Stroke Mother   . Heart disease Mother   . Kidney disease Father   . Hyperlipidemia Father    Social History  reports that he has been smoking Cigarettes.  He has a 40 pack-year smoking history. He has never used smokeless tobacco. He reports that he does not drink alcohol or use illicit  drugs. Allergies No Known Allergies Home medications Prior to Admission medications   Medication Sig Start Date End Date Taking? Authorizing Provider  aspirin 81 MG tablet Take 1 tablet (81 mg total) by mouth daily. 05/24/13  Yes Rhonda G Barrett, PA-C  atorvastatin (LIPITOR) 80 MG tablet Take 1 tablet (80 mg total) by mouth daily at 6 PM. 05/24/13  Yes Rhonda G Barrett, PA-C  b complex-vitamin c-folic acid (NEPHRO-VITE) 0.8 MG TABS Take 0.8 mg by mouth at bedtime.    Yes Historical Provider, MD  calcium carbonate (TUMS - DOSED IN MG ELEMENTAL CALCIUM) 500 MG chewable tablet Chew 3 tablets by mouth 3 (three) times daily.   Yes Historical Provider, MD  colchicine 0.6 MG tablet Take 0.6 mg by mouth 2 (two) times daily as needed. For gout   Yes Historical Provider, MD  esomeprazole (NEXIUM) 40 MG capsule Take 40 mg by mouth every evening.    Yes Historical Provider, MD  gabapentin (NEURONTIN) 100 MG capsule Take 1 capsule by mouth 2 (two) times daily as needed. 12/29/13  Yes Historical Provider, MD  loperamide (IMODIUM) 2 MG capsule Take 4 mg by mouth as needed for diarrhea or loose stools.   Yes Historical Provider, MD  metoprolol tartrate (LOPRESSOR) 25 MG tablet Take 1 tablet (25 mg total) by mouth 2 (two) times daily. Patient taking differently: Take 25 mg by mouth daily.  05/24/13  Yes Rhonda G Barrett, PA-C  naproxen sodium (ANAPROX) 220 MG tablet Take 440 mg by mouth 2 (two) times daily with a meal.   Yes Historical Provider, MD  oxyCODONE (OXY IR/ROXICODONE) 5 MG immediate release tablet Take 1 tablet (5 mg total) by mouth every 6 (six) hours as needed for moderate pain. 08/08/13  Yes Samantha J Rhyne, PA-C  ticagrelor (BRILINTA) 90 MG TABS tablet Take 90 mg by mouth 2 (two) times daily.   Yes Historical Provider, MD  traZODone (DESYREL) 50 MG tablet Take 50 mg by mouth at bedtime as needed for sleep.  03/06/13  Yes Historical Provider, MD  cephALEXin (KEFLEX) 500 MG capsule Take 500 mg by mouth  every 8 (eight) hours.    Historical Provider, MD  nitroGLYCERIN (NITROSTAT) 0.4 MG SL tablet Place 1 tablet (0.4 mg total) under the tongue every 5 (five) minutes as needed for chest pain. 05/24/13   Rhonda G Barrett, PA-C  prasugrel (EFFIENT) 10 MG TABS tablet Take 1 tablet (10 mg total) by mouth daily. Patient not taking: Reported on 06/19/2014 06/16/13   Chrystie Nose, MD  sodium bicarbonate 650 MG tablet Take 650 mg by mouth 2 (two) times daily.      Historical Provider, MD  sulindac (CLINORIL) 200 MG tablet Take 200 mg by mouth 2 (two) times daily.    Historical Provider, MD   Liver Function Tests  Recent Labs Lab 06/19/14 1546 06/20/14 0545  AST 28  --   ALT 16  --   ALKPHOS 49  --   BILITOT 1.0  --   PROT 6.9  --   ALBUMIN 3.0*  3.0* 2.9*   No results for input(s): LIPASE, AMYLASE in the last 168 hours. CBC  Recent Labs Lab 06/19/14 1307 06/19/14 1317 06/20/14 0545  WBC 17.9*  --  15.2*  NEUTROABS 13.7*  --   --   HGB 14.9 17.0 13.8  HCT 43.8 50.0 41.2  MCV 92.2  --  92.6  PLT 190  --  201   Basic Metabolic Panel  Recent Labs Lab 06/19/14 1317 06/19/14 1546 06/20/14 0545  NA 133* 137 137  K 6.6* 5.8* 7.0*  CL 99 96 94*  CO2  --  19 16*  GLUCOSE 68* 120* 72  BUN 124* 131* 140*  CREATININE 15.00* 16.01* 16.03*  CALCIUM  --  6.2* 6.2*  PHOS  --  12.0* 14.4*    Filed Vitals:   06/19/14 2012 06/20/14 0630 06/20/14 0927 06/20/14 0932  BP: 144/76 124/65 142/36 122/39  Pulse: 82 68 71 75  Temp: 98.2 F (36.8 C) 97.6 F (36.4 C) 97.6 F (36.4 C)   TempSrc: Oral Oral Oral   Resp: 20 17 15 23   Weight:   125.9 kg (277 lb 9 oz)   SpO2: 99% 98%     Exam Alert, no distress No rash, cyanosis or gangrene Sclera anicteric, throat clear No jvd Chest clear bilat RRR no MRG Abd soft, NTND No LE edema L thigh AVG patent Neuro is nf, Ox 3  HD: [facility] 3.5h  450/A1.5  116kg   2/3.5 Bath  Heparin 11K   L thigh AVG No  meds  Assessment: 1. Acute on chronic back/ RLE pain - has seen ortho in OP setting and had spinal injection. Has long hx of back pain, not sure what the underlying diagnosis is.  Having problems with pain control and this is affecting is OP dialysis.  Will d/w primary team, consider long-acting pain meds, further investigation regarding cause, possible surgical Rx, etc.  In dialysis narcotics are drawn off resulting in problems for some patients who are narcotic-dependent.  In hospital when can give extra medication IV during HD but in OP setting management is more difficult.  May need pre HD "bolus" of short-acting pain meds +/- long-acting for baseline.  2. ESRD on HD 18 years 3. Multiple access failures - has functional thigh graft on L 4. Hyperkalemia 5. CAD hx 3VCAD / inferior STEMI, not felt to be good candidate for surgery in Jan 2015 , Rx w LAD stent; 100% RCA couldn't be crossed and  high-grade CFX to be rx'd medically 6. MBD s/p PTX, w low pth on high Ca bath 7. Anemia Hb 13, no ESA 8. Volume - up 9 kg, no resp symptoms   Plan - HD today, possibly tomorrow, address chronic pain, get K down w HD, get vol down  Vinson Moselle MD (pgr) [phone]    ([id number] 06/20/2014, 10:01 AM

## 2014-06-20 NOTE — Progress Notes (Signed)
PT Cancellation Note  Patient Details Name: [name] MRN: [id number] DOB: [birth year]   Cancelled Treatment:    Reason Eval/Treat Not Completed: Patient at procedure or test/unavailable. Will check back as schedule allows to complete PT eval.    Conni Slipper 06/20/2014, 1:32 PM   Conni Slipper, PT, DPT Acute Rehabilitation Services Pager: [phone]

## 2014-06-20 NOTE — Progress Notes (Signed)
Internal Medicine Attending  Date: 06/20/2014  Patient name: [name] Medical record number: [id number] Date of birth: Jan 16, 1956 Age: 59 y.o. Gender: male  I saw and evaluated the patient. I discussed patient and reviewed the resident's note by Dr. Danella Penton, and I agree with the resident's findings and plans as documented in her note.  See attending admit note for details of my assessment.

## 2014-06-20 NOTE — Progress Notes (Signed)
UR completed 

## 2014-06-20 NOTE — Progress Notes (Signed)
CRITICAL VALUE ALERT  Critical value received:  K 7.0, Ca 6.2  Date of notification:  2/24  Time of notification:  0835  Critical value read back:Yes.    Nurse who received alert:  Carrie Mew  MD notified (1st page):  Dr. Danella Penton  Time of first page:  [phone]  MD notified (2nd page):   Time of second page: [phone]  Responding MD:      Time MD responded:  HD orders

## 2014-06-21 ENCOUNTER — Inpatient Hospital Stay (HOSPITAL_COMMUNITY): Payer: Medicare Other

## 2014-06-21 DIAGNOSIS — F418 Other specified anxiety disorders: Secondary | ICD-10-CM

## 2014-06-21 DIAGNOSIS — M5441 Lumbago with sciatica, right side: Secondary | ICD-10-CM | POA: Insufficient documentation

## 2014-06-21 DIAGNOSIS — M5410 Radiculopathy, site unspecified: Secondary | ICD-10-CM | POA: Insufficient documentation

## 2014-06-21 DIAGNOSIS — E1142 Type 2 diabetes mellitus with diabetic polyneuropathy: Secondary | ICD-10-CM

## 2014-06-21 DIAGNOSIS — E875 Hyperkalemia: Secondary | ICD-10-CM

## 2014-06-21 DIAGNOSIS — M79605 Pain in left leg: Secondary | ICD-10-CM

## 2014-06-21 LAB — CBC WITH DIFFERENTIAL/PLATELET
Basophils Absolute: 0 10*3/uL (ref 0.0–0.1)
Basophils Relative: 0 % (ref 0–1)
Eosinophils Absolute: 0.3 10*3/uL (ref 0.0–0.7)
Eosinophils Relative: 2 % (ref 0–5)
HCT: 43.5 % (ref 39.0–52.0)
Hemoglobin: 14.3 g/dL (ref 13.0–17.0)
LYMPHS PCT: 10 % — AB (ref 12–46)
Lymphs Abs: 1.4 10*3/uL (ref 0.7–4.0)
MCH: 30.9 pg (ref 26.0–34.0)
MCHC: 32.9 g/dL (ref 30.0–36.0)
MCV: 94 fL (ref 78.0–100.0)
MONOS PCT: 15 % — AB (ref 3–12)
Monocytes Absolute: 2.1 10*3/uL — ABNORMAL HIGH (ref 0.1–1.0)
Neutro Abs: 10.3 10*3/uL — ABNORMAL HIGH (ref 1.7–7.7)
Neutrophils Relative %: 73 % (ref 43–77)
Platelets: 163 10*3/uL (ref 150–400)
RBC: 4.63 MIL/uL (ref 4.22–5.81)
RDW: 16.2 % — ABNORMAL HIGH (ref 11.5–15.5)
WBC: 14.1 10*3/uL — ABNORMAL HIGH (ref 4.0–10.5)

## 2014-06-21 LAB — BASIC METABOLIC PANEL
ANION GAP: 14 (ref 5–15)
BUN: 76 mg/dL — ABNORMAL HIGH (ref 6–23)
CO2: 24 mmol/L (ref 19–32)
Calcium: 7 mg/dL — ABNORMAL LOW (ref 8.4–10.5)
Chloride: 97 mmol/L (ref 96–112)
Creatinine, Ser: 12.66 mg/dL — ABNORMAL HIGH (ref 0.50–1.35)
GFR calc Af Amer: 4 mL/min — ABNORMAL LOW (ref >90)
GFR calc non Af Amer: 4 mL/min — ABNORMAL LOW (ref >90)
Glucose, Bld: 97 mg/dL (ref 70–99)
POTASSIUM: 5.9 mmol/L — AB (ref 3.5–5.1)
SODIUM: 135 mmol/L (ref 135–145)

## 2014-06-21 LAB — PROTIME-INR
INR: 1.03 (ref 0.00–1.49)
PROTHROMBIN TIME: 13.6 s (ref 11.6–15.2)

## 2014-06-21 LAB — GLUCOSE, CAPILLARY
GLUCOSE-CAPILLARY: 104 mg/dL — AB (ref 70–99)
GLUCOSE-CAPILLARY: 110 mg/dL — AB (ref 70–99)
GLUCOSE-CAPILLARY: 89 mg/dL (ref 70–99)

## 2014-06-21 MED ORDER — ALTEPLASE 2 MG IJ SOLR
2.0000 mg | Freq: Once | INTRAMUSCULAR | Status: DC | PRN
  Filled 2014-06-21: qty 2

## 2014-06-21 MED ORDER — LORAZEPAM 2 MG/ML IJ SOLN
INTRAMUSCULAR | Status: AC
  Filled 2014-06-21: qty 1

## 2014-06-21 MED ORDER — PENTAFLUOROPROP-TETRAFLUOROETH EX AERO: 1.0000 "application " | INHALATION_SPRAY | CUTANEOUS | Status: DC | PRN

## 2014-06-21 MED ORDER — LIDOCAINE HCL (PF) 1 % IJ SOLN: 5.0000 mL | INTRAMUSCULAR | Status: DC | PRN

## 2014-06-21 MED ORDER — SODIUM CHLORIDE 0.9 % IV SOLN: 100.0000 mL | INTRAVENOUS | Status: DC | PRN

## 2014-06-21 MED ORDER — PATIENT'S GUIDE TO USING COUMADIN BOOK
Freq: Once | Status: AC
  Administered 2014-06-21: 1
  Filled 2014-06-21: qty 1

## 2014-06-21 MED ORDER — NEPRO/CARBSTEADY PO LIQD: 237.0000 mL | ORAL | Status: DC | PRN

## 2014-06-21 MED ORDER — LIDOCAINE-PRILOCAINE 2.5-2.5 % EX CREA: 1.0000 "application " | TOPICAL_CREAM | CUTANEOUS | Status: DC | PRN

## 2014-06-21 MED ORDER — HEPARIN SODIUM (PORCINE) 1000 UNIT/ML DIALYSIS
1000.0000 [IU] | INTRAMUSCULAR | Status: DC | PRN
  Filled 2014-06-21: qty 1

## 2014-06-21 MED ORDER — HEPARIN SODIUM (PORCINE) 1000 UNIT/ML DIALYSIS
11000.0000 [IU] | Freq: Once | INTRAMUSCULAR | Status: DC
  Filled 2014-06-21: qty 11

## 2014-06-21 MED ORDER — LORAZEPAM 1 MG PO TABS
1.0000 mg | ORAL_TABLET | Freq: Once | ORAL | Status: AC
  Administered 2014-06-21: 1 mg via ORAL
  Filled 2014-06-21: qty 1

## 2014-06-21 MED ORDER — WARFARIN SODIUM 7.5 MG PO TABS
7.5000 mg | ORAL_TABLET | Freq: Once | ORAL | Status: AC
  Administered 2014-06-21: 7.5 mg via ORAL
  Filled 2014-06-21: qty 1

## 2014-06-21 MED ORDER — WARFARIN - PHARMACIST DOSING INPATIENT: Freq: Every day | Status: DC

## 2014-06-21 MED ORDER — LORAZEPAM 2 MG/ML IJ SOLN
1.0000 mg | Freq: Once | INTRAMUSCULAR | Status: AC
  Administered 2014-06-21: 1 mg via INTRAVENOUS

## 2014-06-21 MED ORDER — WARFARIN VIDEO: Freq: Once | Status: DC

## 2014-06-21 NOTE — Progress Notes (Signed)
Subjective: Pt having severe lower back and rt leg pain that is different from yesterday. Pt previously stated he only had back and rt leg pain during dialysis. Pt states he has chronic back pain that has been exacerbated by HD for the past 6 months. He fell on Monday while trying to pull his walker out of his truck onto his rt side. Unable to tolerate HD this morning b/c he could not lay still despite pain meds given prior to HD.   Objective: Vital signs in last 24 hours: Filed Vitals:   06/21/14 0500 06/21/14 0656 06/21/14 0807 06/21/14 1348  BP: 117/69  133/99 154/89  Pulse: 70 76 78 72  Temp: 97.8 F (36.6 C) 97.6 F (36.4 C) 97.7 F (36.5 C) 97.9 F (36.6 C)  TempSrc: Oral Oral Oral Oral  Resp: Weight:  265 lb 10.5 oz (120.5 kg)    SpO2: 100% 96% 99% 99%   Weight change:   Intake/Output Summary (Last 24 hours) at 06/21/14 1351 Last data filed at 06/21/14 1300  Gross per 24 hour  Intake    400 ml  Output      0 ml  Net    400 ml   General: laying on rt side  Lungs: CTAB, no wheezing Cardiac: RRR, no murmurs GI: soft, active bowel sounds Neuro: CN II-XII grossly intact Ext: neg for pedal edema MSK: positive rt straight leg test  Lab Results: Basic Metabolic Panel:  Recent Labs Lab 06/20/14 0545 06/20/14 1608 06/21/14 1034  NA 137 138 135  K 7.0* 4.2 5.9*  CL 94* 96 97  CO2 16* 24 24  GLUCOSE 72 128* 97  BUN 140* 60* 76*  CREATININE 16.03* 9.77* 12.66*  CALCIUM 6.2* 7.8* 7.0*  PHOS 14.4* 7.9*  --    Liver Function Tests:  Recent Labs Lab 06/19/14 1546 06/20/14 0545 06/20/14 1608  AST 28  --   --   ALT 16  --   --   ALKPHOS 49  --   --   BILITOT 1.0  --   --   PROT 6.9  --   --   ALBUMIN 3.0*  3.0* 2.9* 3.0*   CBC:  Recent Labs Lab 06/19/14 1307  06/20/14 1608 06/21/14 1034  WBC 17.9*  < > 11.8* 14.1*  NEUTROABS 13.7*  --   --  10.3*  HGB 14.9  < > 14.3 14.3  HCT 43.8  < > 42.1 43.5  MCV 92.2  < > 90.0 94.0  PLT  190  < > 161 163  < > = values in this interval not displayed. CBG:  Recent Labs Lab 06/20/14 0802 06/20/14 1421 06/20/14 1623 06/20/14 2209 06/21/14 0807 06/21/14 1259  GLUCAP 74 107* 118* 147* 104* 110*    Studies/Results: Dg Lumbar Spine 2-3 Views  06/19/2014   CLINICAL DATA:  Low back pain for 3 days.  No known injury.  EXAM: LUMBAR SPINE - 2-3 VIEW  COMPARISON:  09/19/2013  FINDINGS: Degenerative changes in the lumbar spine at with narrowed lumbar interspaces and associated endplate hypertrophic changes. Prominent degenerative changes at the lumbosacral interspace, similar to prior study. No vertebral compression deformities. No focal bone lesion or bone destruction. Extensive vascular calcifications. Vascular calcifications and calcification in the vas deferens.  IMPRESSION: Degenerative changes in the lumbar spine. No significant change since prior study.   Electronically Signed   By: Burman Nieves M.D.   On: 06/19/2014 21:51  Dg Hip Unilat With Pelvis 2-3 Views Right  06/19/2014   CLINICAL DATA:  Right hip pain for 3 days. History of right hip fracture. No new injury.  EXAM: RIGHT HIP (WITH PELVIS) 2-3 VIEWS  COMPARISON:  None.  FINDINGS: Degenerative changes in the lower lumbar spine and both hips. Postoperative changes with 4 screws fixing the right femoral neck. Surgical hardware appears intact. No residual or recurrent fracture demonstrated in the right hip. Proximal left hip deformity. This may be due to old fracture deformity. Angulation of the head of precludes evaluation of the femoral neck on the left. Pelvis appears intact. No displaced fractures. SI joints and symphysis pubis are not displaced. Ligamentous calcification along the right iliac crest. Diffuse bone demineralization.  IMPRESSION: No definite acute fracture demonstrated. Postoperative changes in the right hip. Nonspecific deformity of the left hip possibly due to old fracture deformity. Deformity limits  evaluation of the left hip.   Electronically Signed   By: Burman Nieves M.D.   On: 06/19/2014 21:50   Medications: I have reviewed the patient's current medications. Scheduled Meds: . aspirin  81 mg Oral Daily  . atorvastatin  80 mg Oral q1800  . calcium carbonate  3 tablet Oral TID  . gabapentin  100 mg Oral BID  . heparin subcutaneous  5,000 Units Subcutaneous 3 times per day  . LORazepam      . metoprolol tartrate  25 mg Oral BID  . multivitamin  1 tablet Oral QHS  . pantoprazole  80 mg Oral Q1200  . sodium bicarbonate  650 mg Oral BID  . sodium chloride  3 mL Intravenous Q12H  . sodium chloride  3 mL Intravenous Q12H  . ticagrelor  90 mg Oral BID   Continuous Infusions:  PRN Meds:.oxyCODONE Assessment/Plan: Principal Problem:   Right leg pain Active Problems:   End-stage renal disease on hemodialysis   HTN (hypertension)   Chronic lower back pain with worsening of rt lower back and rt leg pain--  Lumbar spine xray revealed degenerative changes and xray of rt hip neg for for acute changes. Pt's hip exam is changed from yesterday's exam. Previously pt had a neg straight leg test b/l, this am pt has a positive rt straight leg test. He is also experiencing worsening of rt back and rt leg pain that he states usually only occurs w/ HD.  -  oxycodone 5mg  q6h prn for pain - per nephrology may need pre HD bolus of short acting pain meds with or without long acting pain meds for baseline since narcotics are drawn off with HD.  - unable to obtain MRI lumbar spine b/c pt's shoulders could not fit though machine. Also pt is cannot go through larger MRI scanner in this hospital due to cardiac stent. Will set up outpatient open MRI appointment on discharge and ensure pt has adequate pain control.   Superficial Rt LE thrombus-- LE dopplers positive for acute superficial thrombus in the rt greater saphenous vein beginning at the rt saphanofemoral junction. Will start on coumadin per pharm.  Lovenox would be ideal but CI given ESRD. Will set up INR checks as outpatient with Dr. Blanchie Dessert office.  Hyperkalemia-- potassium 5.9 this morning, in HD currently - will check post dialysis potassium and morning BMET  ESRD-- on MTWF schedule - HD today  - continue home TUMS and renal MVI  Hx of STEMI--in 07/2013. cath revealed 3 vessel disease but he was not a candidate for emergent CABG. He had a  DES to the LAD. He had an occluded RCA that could not be fixed.  - holding home nitro  - continue home asa  and brilinta.   HTN - continue home lopressor  BID   HLD - continue home lipitor   Anxiety - holding home trazadone   DM with peripheral neuropathy-- diabetes is diet controlled. HbA1c 6.3 on 05/2010 - CBGs ACHS - cont home gabapentin  BID  GERD - cont home nexium  FEN - renal/carb mod diet  DVT ppx-- brilinta  Dispo:  Anticipated discharge today or tomorrow.   The patient does have a current PCP Dyke Maes, MD) and does not need an [facility] hospital follow-up appointment after discharge.  The patient does not have transportation limitations that hinder transportation to clinic appointments. .Services Needed at time of discharge: Y = Yes, Blank = No PT:  home health PT, 24 hr supervision/assistance   OT:   RN:   Equipment:   Other:     LOS: 1 day   Gara Kroner, MD 06/21/2014, 1:51 PM

## 2014-06-21 NOTE — Progress Notes (Signed)
Subjective:  This morning too restless with back pain for dialysis; returned in afternoon after Ativan and Oxycodone, currently tolerating treatment  Objective: Vital signs in last 24 hours: Temp:  [97.5 F (36.4 C)-99 F (37.2 C)] 97.9 F (36.6 C) (02/25 1348) Pulse Rate:  [68-78] 72 (02/25 1348) Resp:  [14-17] 17 (02/25 1348) BP: (91-154)/(50-99) 154/89 mmHg (02/25 1348) SpO2:  [96 %-100 %] 99 % (02/25 1348) Weight:  [120.5 kg (265 lb 10.5 oz)] 120.5 kg (265 lb 10.5 oz) (02/25 0656) Weight change:   Intake/Output from previous day: 02/24 0701 - 02/25 0700 In: -  Out: 4490  Intake/Output this shift: Total I/O In: 400 [P.O.:400] Out: -   Lab Results:  Recent Labs  06/20/14 1608 06/21/14 1034  WBC 11.8* 14.1*  HGB 14.3 14.3  HCT 42.1 43.5  PLT 161 163   BMET:  Recent Labs  06/20/14 0545 06/20/14 1608 06/21/14 1034  NA 137 138 135  K 7.0* 4.2 5.9*  CL 94* 96 97  CO2 16* 24 24  GLUCOSE 72 128* 97  BUN 140* 60* 76*  CREATININE 16.03* 9.77* 12.66*  CALCIUM 6.2* 7.8* 7.0*  ALBUMIN 2.9* 3.0*  --    No results for input(s): PTH in the last 72 hours. Iron Studies: No results for input(s): IRON, TIBC, TRANSFERRIN, FERRITIN in the last 72 hours.  Studies/Results: No results found.   Inpatient Medications: . aspirin  81 mg Oral Daily  . atorvastatin  80 mg Oral q1800  . calcium carbonate  3 tablet Oral TID  . gabapentin  100 mg Oral BID  . heparin subcutaneous  5,000 Units Subcutaneous 3 times per day  . LORazepam      . metoprolol tartrate  25 mg Oral BID  . multivitamin  1 tablet Oral QHS  . pantoprazole  80 mg Oral Q1200  . sodium bicarbonate  650 mg Oral BID  . sodium chloride  3 mL Intravenous Q12H  . sodium chloride  3 mL Intravenous Q12H  . ticagrelor  90 mg Oral BID   EXAM: General appearance:  Alert, in no apparent distress Resp:  CTA without rales, rhonchi, or wheezes Cardio:  RRR without murmur or rub GI:  + BS, soft and  nontender Extremities:  No edema Access:  L thigh AVG with BFR 450  HD: [facility] 3.5h 450/A1.5 116kg 2/3.5 Bath Heparin 11K L thigh AVG No meds  Assessment/Plan: 1. Acute-on-chronic back and RLE pain - difficulty with pain control affecting HD; MRI of lumbar spine today with results pending, currently comfortable s/p Ativan, also Oxycodone 5 mg q6h. 2. ESRD - HD on [facility] @ [facility], K 5.9 pre-HD.  HD today.  This is extra HD today to try to get down to dry wt 3. Hx multiple access failures - now with L thigh AVG 4. HTN/Volume - BP 143/93 on Metoprolol 25 mg bid; wt 120.5 kg s/p net UF 4.5 L on 2/23 (1st full Tx in long time). 5. Anemia - Hgb 14.3, no meds. 6. 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 7. Nutrition - Alb 3, renal carb-mod diet, vitamin. 8. Hx 3-vessel CAD - inferior STEMI, deemed poor candidate for surgery in 04/2013.   LOS: 1 day   LYLES,CHARLES 06/21/2014,2:00 PM   Pt seen, examined, agree w assess/plan as above with additions as indicated.  Vinson Moselle MD pager [phone]    cell [phone] 06/21/2014, 5:08 PM

## 2014-06-21 NOTE — Progress Notes (Addendum)
ANTICOAGULATION CONSULT NOTE - Initial Consult  Pharmacy Consult for coumadin Indication: superficial thrombus  No Known Allergies  Patient Measurements: Weight: 265 lb 10.5 oz (120.5 kg)   Vital Signs: Temp: 97.9 F (36.6 C) (02/25 1348) Temp Source: Oral (02/25 1348) BP: 137/81 mmHg (02/25 1500) Pulse Rate: 72 (02/25 1500)  Labs:  Recent Labs  06/20/14 0545 06/20/14 1608 06/21/14 1034  HGB 13.8 14.3 14.3  HCT 41.2 42.1 43.5  PLT 201 161 163  CREATININE 16.03* 9.77* 12.66*    Estimated Creatinine Clearance: 8.3 mL/min (by C-G formula based on Cr of 12.66).   Medical History: Past Medical History  Diagnosis Date  . Anxiety   . Back pain   . GERD (gastroesophageal reflux disease)   . Peripheral neuropathy   . Arthritis   . CHF (congestive heart failure)   . Active smoker   . ESRD on hemodialysis     [name] 4 days per week on M-Tu-Wed and Fri.  Started HD in 1998 and has been on HD [facility] at [name], then went to [facility], then in [facility], then to [facility] and now is at [facility] for last 10 years.  Has L thigh AVG.     . Diabetes mellitus     diet controlled  . 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 PONV (postoperative nausea and vomiting)   . Hypertension   . Bell palsy   . Heart murmur   . Carpal tunnel syndrome     bilateral  . Dysrhythmia     Hx: of palpitataions  . Headache(784.0)     Hx: of Migraines    Medications:  Prescriptions prior to admission  Medication Sig Dispense Refill Last Dose  . aspirin 81 MG tablet Take 1 tablet (81 mg total) by mouth daily.   06/18/2014 at Unknown time  . atorvastatin (LIPITOR) 80 MG tablet Take 1 tablet (80 mg total) by mouth daily at 6 PM. 30 tablet 11 06/18/2014 at Unknown time  . b complex-vitamin c-folic acid (NEPHRO-VITE) 0.8 MG TABS Take 0.8 mg by mouth at bedtime.    06/18/2014 at Unknown time  . calcium carbonate (TUMS - DOSED IN MG ELEMENTAL CALCIUM) 500 MG chewable tablet  Chew 3 tablets by mouth 3 (three) times daily.   Past Week at Unknown time  . colchicine 0.6 MG tablet Take 0.6 mg by mouth 2 (two) times daily as needed. For gout   06/18/2014 at Unknown time  . esomeprazole (NEXIUM) 40 MG capsule Take 40 mg by mouth every evening.    06/18/2014 at Unknown time  . gabapentin (NEURONTIN) 100 MG capsule Take 1 capsule by mouth 2 (two) times daily as needed.   Past Week at Unknown time  . latanoprost (XALATAN) 0.005 % ophthalmic solution Place 1 drop into both eyes at bedtime.   2 Past Week at Unknown time  . loperamide (IMODIUM) 2 MG capsule Take 4 mg by mouth as needed for diarrhea or loose stools.   06/19/2014 at Unknown time  . metoprolol tartrate (LOPRESSOR) 25 MG tablet Take 1 tablet (25 mg total) by mouth 2 (two) times daily. (Patient taking differently: Take 25 mg by mouth daily. ) 60 tablet 11 06/18/2014 at 6 pm  . naproxen sodium (ANAPROX) 220 MG tablet Take 440 mg by mouth 2 (two) times daily with a meal.   06/18/2014 at Unknown time  . oxyCODONE (OXY IR/ROXICODONE) 5 MG  immediate release tablet Take 1 tablet (5 mg total) by mouth every 6 (six) hours as needed for moderate pain. 30 tablet 0 06/19/2014 at Unknown time  . ticagrelor (BRILINTA) 90 MG TABS tablet Take 90 mg by mouth 2 (two) times daily.   06/18/2014 at Unknown time  . traZODone (DESYREL) 50 MG tablet Take 50 mg by mouth at bedtime as needed for sleep.    06/18/2014 at Unknown time  . cephALEXin (KEFLEX) 500 MG capsule Take 500 mg by mouth every 8 (eight) hours.   Not Taking at Unknown time  . nitroGLYCERIN (NITROSTAT) 0.4 MG SL tablet Place 1 tablet (0.4 mg total) under the tongue every 5 (five) minutes as needed for chest pain. 25 tablet 3 never  . prasugrel (EFFIENT) 10 MG TABS tablet Take 1 tablet (10 mg total) by mouth daily. (Patient not taking: Reported on 06/19/2014) 30 tablet 6 Not Taking at Unknown time  . sodium bicarbonate 650 MG tablet Take 650 mg by mouth 2 (two) times daily.     Not Taking at  Unknown time  . sulindac (CLINORIL) 200 MG tablet Take 200 mg by mouth 2 (two) times daily.   unknown   Scheduled:  . aspirin  81 mg Oral Daily  . atorvastatin  80 mg Oral q1800  . calcium carbonate  3 tablet Oral TID  . gabapentin  100 mg Oral BID  . heparin subcutaneous  5,000 Units Subcutaneous 3 times per day  . LORazepam      . metoprolol tartrate  25 mg Oral BID  . multivitamin  1 tablet Oral QHS  . pantoprazole  80 mg Oral Q1200  . sodium bicarbonate  650 mg Oral BID  . sodium chloride  3 mL Intravenous Q12H  . sodium chloride  3 mL Intravenous Q12H  . ticagrelor  90 mg Oral BID    Assessment: 59 yo male with superficial Rt LE thrombus (venous doppler on 2/23 shows- acute superficial thrombus in the right greater Saphenous vein beginning at the right saphanofemoral junction). Pharmacy has been consulted to dose coumadin (baseline INR noted as 1.03).  He is noted on sq heparin.  Patient is also on aspirin and Brilinta. He is s/p DES to LAD in 04/2013. Per cardiology office  note on 02/08/14 plans were antiplatelet therapy until at least January 2016 with preference of long term antiplatelet therapy with possible switch to plavix.   Goal of Therapy:  INR 2-3 Monitor platelets by anticoagulation protocol: Yes   Plan:  -Coumadin 7.5mg  po today -Daily PT/INR -Consider consulting with cardiology regarding possible switch to plavix. Would also consider avoiding triple therapy while on coumadin (could consider coumadin with brilinta only).  Harland German, Pharm D 06/21/2014 4:35 PM

## 2014-06-21 NOTE — Progress Notes (Signed)
Hemodialysis= Pt brought to unit for HD this am. Would not lay still, refusing to cooperate. Medicated for pain approx 30 minutes prior to pick up. Unable to obtain accurate bp on patient d/t constant movement. States "I wont do dialysis like this." PA aware and present at bedside. Report called to RN on 6E.

## 2014-06-21 NOTE — Progress Notes (Signed)
Internal Medicine Attending  Date: 06/21/2014  Patient name: [name] Medical record number: [id number] Date of birth: 30-Jun-1955 Age: 59 y.o. Gender: male  I saw and evaluated the patient, and discussed his care with resident on A.M rounds.  I reviewed the resident's note by Dr. Danella Penton and I agree with the resident's findings and plans as documented in her note, with the following additional comments.  Would discuss with radiology whether a CT scan of the lumbosacral spine would be reasonable alternative to MRI, since we are unable to do MRI here.

## 2014-06-21 NOTE — Evaluation (Signed)
Physical Therapy Evaluation Patient Details Name: [name] MRN: [id number] DOB: 1955/12/19 Today's Date: 06/21/2014   History of Present Illness  Patient is a 59 year old man with history of end-stage renal disease on hemodialysis, diabetes mellitus, hypertension, chronic back pain, and other problems as outlined in the medical history who reports pain in his right lower back radiating into his right lower extremity which is worse during hemodialysis sessions. The pain is so severe that he missed one recent HD session and cut short another.  Clinical Impression  Pt admitted with above diagnosis. Pt currently with functional limitations due to the deficits listed below (see PT Problem List). At the time of PT eval pt was able to perform transfers with min assist and RW. Pt significantly limited by pain and was unable to achieve an upright posture while standing, or tolerate sitting up in the recliner chair. Attempted multiple positions for pain control with no success. Pt was returned to bed and was positioned on his side with a pillow between knees. Pt will benefit from skilled PT to increase their independence and safety with mobility to allow discharge to the venue listed below. Will require more detail about PLOF and home situation. If pt does not improve with therapy, may need to consider STR at d/c.      Follow Up Recommendations Home health PT;Supervision/Assistance - 24 hour    Equipment Recommendations  None recommended by PT    Recommendations for Other Services       Precautions / Restrictions Precautions Precautions: Fall Precaution Comments: Pt needs education on back precautions to minimize pain during transfers Restrictions Weight Bearing Restrictions: No      Mobility  Bed Mobility Overal bed mobility: Modified Independent             General bed mobility comments: Use of bed rails and increased time required. Pt with difficulty maintaining upright sitting  posture at first due to pain. Required UE support.   Transfers Overall transfer level: Needs assistance Equipment used: Rolling walker (2 wheeled) Transfers: Sit to/from [employer] Sit to Stand: Min assist Stand pivot transfers: Min assist       General transfer comment: Assist for balance and safety. Pt impulsive with intiiating transfers due to pain - especially to stand up to alleviate some of his pain. Pt was unable to achieve full stand with upright posture. Pt with BUE support on walker, but leaning on L elbow with trunk flexed for comfort as he took pivotal steps around. Unable to attempt trunk extension when cued.   Ambulation/Gait             General Gait Details: Unable to attempt due to pain.   Stairs            Wheelchair Mobility    Modified Rankin (Stroke Patients Only)       Balance Overall balance assessment: Needs assistance Sitting-balance support: Feet supported;No upper extremity supported Sitting balance-Leahy Scale: Poor Sitting balance - Comments: Requiores UE support due to pain   Standing balance support: Bilateral upper extremity supported;During functional activity Standing balance-Leahy Scale: Poor Standing balance comment: Requires UE support to maintain standing balance at this time.                              Pertinent Vitals/Pain Pain Assessment: Faces Faces Pain Scale: Hurts whole lot Pain Location: Low back radiating into R leg Pain Descriptors / Indicators: Aching;Grimacing;Radiating  Pain Intervention(s): Limited activity within patient's tolerance;Monitored during session;Premedicated before session;Repositioned    Home Living Family/patient expects to be discharged to:: Private residence Living Arrangements: Parent Available Help at Discharge: Family;Available 24 hours/day Type of Home: House         Home Equipment: Walker - 2 wheels Additional Comments: Full history not obtained. Pt  very distracted with pain.     Prior Function Level of Independence: Independent with assistive device(s)         Comments: Pt reports he used the RW all the time and used a sock aid to assist with dressing at home.      Hand Dominance   Dominant Hand: Right    Extremity/Trunk Assessment   Upper Extremity Assessment: Defer to OT evaluation           Lower Extremity Assessment: Generalized weakness;RLE deficits/detail (Difficult to assess - unable to MMT due to pain) RLE Deficits / Details: Increased acute pain due to radiating symptoms from low back. Pt reports pain stops at his knee.     Cervical / Trunk Assessment: Normal  Communication   Communication: No difficulties  Cognition Arousal/Alertness: Awake/alert Behavior During Therapy: Restless;Anxious;Impulsive (with pain) Overall Cognitive Status: Within Functional Limits for tasks assessed                      General Comments      Exercises        Assessment/Plan    PT Assessment Patient needs continued PT services  PT Diagnosis Difficulty walking;Acute pain   PT Problem List Decreased strength;Decreased range of motion;Decreased activity tolerance;Decreased balance;Decreased mobility;Decreased knowledge of use of DME;Decreased safety awareness;Pain  PT Treatment Interventions DME instruction;Stair training;Gait training;Functional mobility training;Therapeutic activities;Therapeutic exercise;Neuromuscular re-education;Patient/family education   PT Goals (Current goals can be found in the Care Plan section) Acute Rehab PT Goals Patient Stated Goal: Decrease his pain PT Goal Formulation: With patient Time For Goal Achievement: 07/05/14 Potential to Achieve Goals: Good    Frequency Min 3X/week   Barriers to discharge        Co-evaluation               End of Session Equipment Utilized During Treatment: Gait belt Activity Tolerance: Patient limited by pain Patient left: in bed;with  call bell/phone within reach;Other (comment) (MD team in room) Nurse Communication: Mobility status         Time: [phone] PT Time Calculation (min) (ACUTE ONLY): 17 min   Charges:   PT Evaluation $Initial PT Evaluation Tier I: 1 Procedure     PT G Codes:        Conni Slipper 07-05-14, 9:41 AM   Conni Slipper, PT, DPT Acute Rehabilitation Services Pager: [phone]

## 2014-06-22 DIAGNOSIS — I82819 Embolism and thrombosis of superficial veins of unspecified lower extremities: Secondary | ICD-10-CM | POA: Insufficient documentation

## 2014-06-22 DIAGNOSIS — M79604 Pain in right leg: Secondary | ICD-10-CM

## 2014-06-22 DIAGNOSIS — I82811 Embolism and thrombosis of superficial veins of right lower extremities: Secondary | ICD-10-CM

## 2014-06-22 DIAGNOSIS — I2510 Atherosclerotic heart disease of native coronary artery without angina pectoris: Secondary | ICD-10-CM

## 2014-06-22 LAB — BASIC METABOLIC PANEL
Anion gap: 15 (ref 5–15)
BUN: 48 mg/dL — AB (ref 6–23)
CALCIUM: 7.4 mg/dL — AB (ref 8.4–10.5)
CO2: 26 mmol/L (ref 19–32)
Chloride: 92 mmol/L — ABNORMAL LOW (ref 96–112)
Creatinine, Ser: 9.73 mg/dL — ABNORMAL HIGH (ref 0.50–1.35)
GFR calc Af Amer: 6 mL/min — ABNORMAL LOW (ref >90)
GFR calc non Af Amer: 5 mL/min — ABNORMAL LOW (ref >90)
Glucose, Bld: 89 mg/dL (ref 70–99)
POTASSIUM: 5.3 mmol/L — AB (ref 3.5–5.1)
Sodium: 133 mmol/L — ABNORMAL LOW (ref 135–145)

## 2014-06-22 LAB — GLUCOSE, CAPILLARY
Glucose-Capillary: 123 mg/dL — ABNORMAL HIGH (ref 70–99)
Glucose-Capillary: 87 mg/dL (ref 70–99)
Glucose-Capillary: 83 mg/dL (ref 70–99)

## 2014-06-22 LAB — PROTIME-INR
INR: 1.05 (ref 0.00–1.49)
Prothrombin Time: 13.9 seconds (ref 11.6–15.2)

## 2014-06-22 MED ORDER — OXYCODONE HCL 5 MG PO TABS
5.0000 mg | ORAL_TABLET | ORAL | Status: DC | PRN
  Administered 2014-06-22 – 2014-06-28 (×23): 10 mg via ORAL
  Filled 2014-06-22 (×21): qty 2

## 2014-06-22 MED ORDER — LORAZEPAM 1 MG PO TABS
1.0000 mg | ORAL_TABLET | Freq: Once | ORAL | Status: DC
  Filled 2014-06-22: qty 1

## 2014-06-22 MED ORDER — LORAZEPAM 2 MG/ML IJ SOLN
1.0000 mg | Freq: Once | INTRAMUSCULAR | Status: AC
  Administered 2014-06-22: 1 mg via INTRAVENOUS
  Filled 2014-06-22: qty 1

## 2014-06-22 MED ORDER — WARFARIN SODIUM 7.5 MG PO TABS
7.5000 mg | ORAL_TABLET | Freq: Once | ORAL | Status: AC
  Administered 2014-06-22: 7.5 mg via ORAL
  Filled 2014-06-22: qty 1

## 2014-06-22 NOTE — Progress Notes (Signed)
ANTICOAGULATION CONSULT NOTE - Initial Consult  Pharmacy Consult for coumadin Indication: superficial thrombus  No Known Allergies  Patient Measurements: Weight: 256 lb 13.4 oz (116.5 kg)   Vital Signs: Temp: 98 F (36.7 C) (02/26 0745) Temp Source: Oral (02/26 0745) BP: 115/76 mmHg (02/26 1012) Pulse Rate: 67 (02/26 1012)  Labs:  Recent Labs  06/20/14 0545 06/20/14 1608 06/21/14 1034 06/21/14 1700 06/22/14 0820  HGB 13.8 14.3 14.3  --   --   HCT 41.2 42.1 43.5  --   --   PLT 201 161 163  --   --   LABPROT  --   --   --  13.6 13.9  INR  --   --   --  1.03 1.05  CREATININE 16.03* 9.77* 12.66*  --  9.73*    Estimated Creatinine Clearance: 10.6 mL/min (by C-G formula based on Cr of 9.73).   Medical History: Past Medical History  Diagnosis Date  . Anxiety   . Back pain   . GERD (gastroesophageal reflux disease)   . Peripheral neuropathy   . Arthritis   . CHF (congestive heart failure)   . Active smoker   . ESRD on hemodialysis     [name] 4 days per week on M-Tu-Wed and Fri.  Started HD in 1998 and has been on HD initially at [facility], then went to [facility], then in [facility], then to [facility] and now is at [facility] for last 10 years.  Has L thigh AVG.     . Diabetes mellitus     diet controlled  . 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 PONV (postoperative nausea and vomiting)   . Hypertension   . Bell palsy   . Heart murmur   . Carpal tunnel syndrome     bilateral  . Dysrhythmia     Hx: of palpitataions  . Headache(784.0)     Hx: of Migraines    Medications:  Prescriptions prior to admission  Medication Sig Dispense Refill Last Dose  . aspirin 81 MG tablet Take 1 tablet (81 mg total) by mouth daily.   06/18/2014 at Unknown time  . atorvastatin (LIPITOR) 80 MG tablet Take 1 tablet (80 mg total) by mouth daily at 6 PM. 30 tablet 11 06/18/2014 at Unknown time  . b complex-vitamin c-folic acid (NEPHRO-VITE) 0.8 MG TABS Take 0.8 mg  by mouth at bedtime.    06/18/2014 at Unknown time  . calcium carbonate (TUMS - DOSED IN MG ELEMENTAL CALCIUM) 500 MG chewable tablet Chew 3 tablets by mouth 3 (three) times daily.   Past Week at Unknown time  . colchicine 0.6 MG tablet Take 0.6 mg by mouth 2 (two) times daily as needed. For gout   06/18/2014 at Unknown time  . esomeprazole (NEXIUM) 40 MG capsule Take 40 mg by mouth every evening.    06/18/2014 at Unknown time  . gabapentin (NEURONTIN) 100 MG capsule Take 1 capsule by mouth 2 (two) times daily as needed.   Past Week at Unknown time  . latanoprost (XALATAN) 0.005 % ophthalmic solution Place 1 drop into both eyes at bedtime.   2 Past Week at Unknown time  . loperamide (IMODIUM) 2 MG capsule Take 4 mg by mouth as needed for diarrhea or loose stools.   06/19/2014 at Unknown time  . metoprolol tartrate (LOPRESSOR) 25 MG tablet Take 1 tablet (25 mg total) by mouth 2 (two)  times daily. (Patient taking differently: Take 25 mg by mouth daily. ) 60 tablet 11 06/18/2014 at 6 pm  . naproxen sodium (ANAPROX) 220 MG tablet Take 440 mg by mouth 2 (two) times daily with a meal.   06/18/2014 at Unknown time  . oxyCODONE (OXY IR/ROXICODONE) 5 MG immediate release tablet Take 1 tablet (5 mg total) by mouth every 6 (six) hours as needed for moderate pain. 30 tablet 0 06/19/2014 at Unknown time  . ticagrelor (BRILINTA) 90 MG TABS tablet Take 90 mg by mouth 2 (two) times daily.   06/18/2014 at Unknown time  . traZODone (DESYREL) 50 MG tablet Take 50 mg by mouth at bedtime as needed for sleep.    06/18/2014 at Unknown time  . cephALEXin (KEFLEX) 500 MG capsule Take 500 mg by mouth every 8 (eight) hours.   Not Taking at Unknown time  . nitroGLYCERIN (NITROSTAT) 0.4 MG SL tablet Place 1 tablet (0.4 mg total) under the tongue every 5 (five) minutes as needed for chest pain. 25 tablet 3 never  . prasugrel (EFFIENT) 10 MG TABS tablet Take 1 tablet (10 mg total) by mouth daily. (Patient not taking: Reported on 06/19/2014)  30 tablet 6 Not Taking at Unknown time  . sodium bicarbonate 650 MG tablet Take 650 mg by mouth 2 (two) times daily.     Not Taking at Unknown time  . sulindac (CLINORIL) 200 MG tablet Take 200 mg by mouth 2 (two) times daily.   unknown   Scheduled:  . atorvastatin  80 mg Oral q1800  . calcium carbonate  3 tablet Oral TID  . gabapentin  100 mg Oral BID  . heparin subcutaneous  5,000 Units Subcutaneous 3 times per day  . LORazepam  1 mg Intravenous Once  . metoprolol tartrate  25 mg Oral BID  . multivitamin  1 tablet Oral QHS  . pantoprazole  80 mg Oral Q1200  . sodium bicarbonate  650 mg Oral BID  . sodium chloride  3 mL Intravenous Q12H  . sodium chloride  3 mL Intravenous Q12H  . ticagrelor  90 mg Oral BID  . warfarin   Does not apply Once  . Warfarin - Pharmacist Dosing Inpatient   Does not apply q1800    Assessment: 59 yo male with superficial Rt LE thrombus (venous doppler on 2/23 shows- acute superficial thrombus in the right greater Saphenous vein beginning at the right saphanofemoral junction). Pharmacy has been consulted to dose coumadin (baseline INR noted as 1.03).  He is noted on sq heparin.  INR is 1.05 after first dose of coumadin given last night.  No bleeding documented.  Goal of Therapy:  INR 2-3 Monitor platelets by anticoagulation protocol: Yes   Plan:  -Coumadin 7.5mg  po today -Daily PT/INR  Dorna Leitz, PharmD, BCPS

## 2014-06-22 NOTE — Progress Notes (Signed)
Subjective: Pt complaining of lower rt back pain, laying on rt side during exam. Oxycodone was increased to 10mg  by overnight team.   Objective: Vital signs in last 24 hours: Filed Vitals:   06/21/14 2100 06/22/14 0609 06/22/14 0745 06/22/14 1012  BP: 102/68 127/61 93/72 115/76  Pulse: 73 69 66 67  Temp: 98.6 F (37 C) 98.3 F (36.8 C) 98 F (36.7 C)   TempSrc: Oral Oral Oral   Resp: 19 18 18    Weight:      SpO2: 98% 97% 95%    Weight change: -20 lb 11.6 oz (-9.4 kg)  Intake/Output Summary (Last 24 hours) at 06/22/14 1312 Last data filed at 06/22/14 0208  Gross per 24 hour  Intake    240 ml  Output   4000 ml  Net  -3760 ml   General: laying on rt side  Lungs: CTAB, no wheezing Cardiac: RRR, no murmurs GI: soft, active bowel sounds Neuro: CN II-XII grossly intact Ext: neg for pedal edema MSK: rt hip non tender to palpation, decreased external rotation of rt hip, straight leg test improved from yesterday's exam -able to fully flex at hip equally b/l with pain at lower back.   Lab Results: Basic Metabolic Panel:  Recent Labs Lab 06/20/14 0545 06/20/14 1608 06/21/14 1034 06/22/14 0820  NA 137 138 135 133*  K 7.0* 4.2 5.9* 5.3*  CL 94* 96 97 92*  CO2 16* 24 24 26   GLUCOSE 72 128* 97 89  BUN 140* 60* 76* 48*  CREATININE 16.03* 9.77* 12.66* 9.73*  CALCIUM 6.2* 7.8* 7.0* 7.4*  PHOS 14.4* 7.9*  --   --    Liver Function Tests:  Recent Labs Lab 06/19/14 1546 06/20/14 0545 06/20/14 1608  AST 28  --   --   ALT 16  --   --   ALKPHOS 49  --   --   BILITOT 1.0  --   --   PROT 6.9  --   --   ALBUMIN 3.0*  3.0* 2.9* 3.0*   CBC:  Recent Labs Lab 06/19/14 1307  06/20/14 1608 06/21/14 1034  WBC 17.9*  < > 11.8* 14.1*  NEUTROABS 13.7*  --   --  10.3*  HGB 14.9  < > 14.3 14.3  HCT 43.8  < > 42.1 43.5  MCV 92.2  < > 90.0 94.0  PLT 190  < > 161 163  < > = values in this interval not displayed. CBG:  Recent Labs Lab 06/20/14 2209 06/21/14 0807  06/21/14 1259 06/21/14 1816 06/21/14 2057 06/22/14 0744  GLUCAP 147* 104* 110* 89 123* 87    Studies/Results: No results found. Medications: I have reviewed the patient's current medications. Scheduled Meds: . atorvastatin  80 mg Oral q1800  . calcium carbonate  3 tablet Oral TID  . gabapentin  100 mg Oral BID  . heparin subcutaneous  5,000 Units Subcutaneous 3 times per day  . LORazepam  1 mg Intravenous Once  . metoprolol tartrate  25 mg Oral BID  . multivitamin  1 tablet Oral QHS  . pantoprazole  80 mg Oral Q1200  . sodium bicarbonate  650 mg Oral BID  . sodium chloride  3 mL Intravenous Q12H  . sodium chloride  3 mL Intravenous Q12H  . ticagrelor  90 mg Oral BID  . warfarin  7.5 mg Oral ONCE-1800  . warfarin   Does not apply Once  . Warfarin - Pharmacist Dosing Inpatient   Does not  apply q1800   Continuous Infusions:  PRN Meds:.oxyCODONE Assessment/Plan: Principal Problem:   Right leg pain Active Problems:   End-stage renal disease on hemodialysis   HTN (hypertension)   Radicular leg pain   Right-sided low back pain with right-sided sciatica   Chronic lower back pain with worsening of rt lower back and rt leg pain--  Lumbar spine xray revealed degenerative changes and xray of rt hip neg for for acute changes. Straight leg test improved from yesterday.  -  oxycodone  q4h prn for pain - per nephrology may need pre HD bolus of short acting pain meds with or without long acting pain meds for baseline since narcotics are drawn off with HD.  - unable to obtain MRI lumbar spine b/c pt's shoulders could not fit though machine yesterday, however MRI willing to attempt again today. Will consider CT of lumbar spine if pt unable to get MRI of lumbar spine today.  - ativan  IV prior to MRI procedure today  Superficial Rt LE thrombus-- LE dopplers positive for acute superficial thrombus in the rt greater saphenous vein beginning at the rt saphanofemoral junction.  -  started on coumadin per pharm yesterday, recommended length of tx is 4 weeks.  - Dr. Gala Romney w/ cardiology saw patient regarding concurrent asa and brilinta use for hx of stent. Recommended discontinuing brilinta if coumadin is continued for SVT.  - consulted vascular surgery regarding need for anticoagulation. Given hx of pt's fall risk would like to avoid anticoagulation if possible however location of SVT near saphanofemoral junction has increased risk to progress to DVT. Appreciate VVS recommendations.   Hyperkalemia-- potassium 5.3 this morning, in HD tomorrow - will continue to monitor, BMET tomorrow  ESRD-- on MTWF schedule - HD tomorrow then resume normal schedule Monday.  - continue home TUMS and renal MVI  Hx of STEMI--in 07/2013. cath revealed 3 vessel disease but he was not a candidate for emergent CABG. He had a DES to the LAD. He had an occluded RCA that could not be fixed.  - holding home nitro  - continue home asa  and brilinta.   HTN - continue home lopressor  BID   HLD - continue home lipitor   Anxiety - holding home trazadone   DM with peripheral neuropathy-- diabetes is diet controlled. HbA1c 6.3 on 05/2010 - CBGs ACHS - cont home gabapentin  BID  GERD - cont home nexium  FEN - renal/carb mod diet  DVT ppx-- brilinta  Dispo:  Anticipated 1-2 days.   The patient does have a current PCP Dyke Maes, MD) and does not need an [facility] hospital follow-up appointment after discharge.  The patient does not have transportation limitations that hinder transportation to clinic appointments. .Services Needed at time of discharge: Y = Yes, Blank = No PT:  home health PT, 24 hr supervision/assistance   OT:   RN:   Equipment:   Other:     LOS: 2 days   Gara Kroner, MD  Pager # [phone] 06/22/2014, 1:12 PM

## 2014-06-22 NOTE — Discharge Instructions (Signed)

## 2014-06-22 NOTE — Care Management Note (Signed)
    Page 1 of 1   06/24/2014     12:52:27 PM CARE MANAGEMENT NOTE 06/24/2014  Patient:  [name], [name]   Account Number:  [id number]  Date Initiated:  06/22/2014  Documentation initiated by:  AMERSON,JULIE  Subjective/Objective Assessment:   Pt adm on 06/19/14 with back pain, RT LE thrombus.  PTA, pt resides at home with family.     Action/Plan:   PT recommending HHPT at dc.  Will follow for dc needs   Anticipated DC Date:  06/24/2014   Anticipated DC Plan:  [place]  CM consult      [facility] Choice  HOME HEALTH   Choice offered to / List presented to:  C-1 Patient        [place] arranged  [facility]      [place].   Status of service:  Completed, signed off Medicare Important Message given?  YES (If response is "NO", the following Medicare IM given date fields will be blank) Date Medicare IM given:  06/22/2014 Medicare IM given by:  AMERSON,JULIE Date Additional Medicare IM given:   Additional Medicare IM given by:    Discharge Disposition:  [place]  Per UR Regulation:  Reviewed for med. necessity/level of care/duration of stay  If discussed at [place] of Stay Meetings, dates discussed:    Comments:  06/24/14 12:45 Cm met with pt to offer choice of home health agency. Pt chooses [facility] to render HHPT.  Pt will be recuperating at his mother's home address: [address] [place], [place] [address].  Pt's mobile number has changed to [phone].  referral called to [facility] rep, Lecretia with change of address and change of contact number.  No other CM needs were communicated. Mariane Masters, BSN, Cm [phone].

## 2014-06-22 NOTE — Consult Note (Signed)
Referring Physician: [facility] Reason for Consultation: Anticoagulation recommendations   HPI:  Patient is a 59 year old man with history of end-stage renal disease on hemodialysis, diabetes mellitus, CAD s/p DES to LAD in 1/15, hypertension, chronic back pain  who was admitted with severe right lower back radiating into his right lower extremity.  Admitted to [facility]. Right lower extremity venous duplex study showed evidence of acute superficial thrombosis in the right greater saphenous vein beginning of the right saphenofemoral junction; there was no DVT. Primary team felt warfarin was indicated and has asked [place] to comment and handling of DAPT in setting of warfarin therapy.   He has had problems with HD access and has grafts in each extremity.   Admitted with inferior STEMI in 1/15. Cath with 3-v CAD (LM: ok LAD 95% mid, LCx 80% distal RCA occluded. Unable to open RCA. LAD treated with DES)  EF 55%. Has been on ASA and Brilinta since.   Denies recurrent CP or SOB. C/o ongoing back pain. Unable to sit up    Review of Systems:     Cardiac Review of Systems: {Y] = yes [ ]  = no  Chest Pain [    ]  Resting SOB [   ] Exertional SOB  [  ]  Orthopnea [  ]   Pedal Edema [   ]    Palpitations [  ] Syncope  [  ]   Presyncope [   ]  General Review of Systems: [Y] = yes [  ]=no Constitional: recent weight change [  ]; anorexia [  ]; fatigue [  ]; nausea [  ]; night sweats [  ]; fever [  ]; or chills [  ];                 Eyes : blurred vision [  ]; diplopia [   ]; vision changes [  ];  Amaurosis fugax[  ]; Resp: cough [  ];  wheezing[  ];  hemoptysis[  ];  PND [  ];  GI:  gallstones[  ], vomiting[  ];  dysphagia[  ]; melena[  ];  hematochezia [  ]; heartburn[  ];   GU: kidney stones [  ]; hematuria[  ];   dysuria [  ];  nocturia[  ]; incontinence [  ];             Skin: rash, swelling[  ];, hair loss[  ];  peripheral edema[  ];  or itching[  ]; Musculosketetal: myalgias[  ];  joint swelling[  ];   joint erythema[  ];  joint painy[  ];  back pain[  y];  Heme/Lymph: bruising[  ];  bleeding[  ];  anemia[ y ];  Neuro: TIA[  ];  headaches[  ];  stroke[  ];  vertigo[  ];  seizures[  ];   paresthesias[  ];  difficulty walking[  ];  Psych:depression[  ]; anxiety[  ];  Endocrine: diabetes[ y ];  thyroid dysfunction[  ];  Other:  Past Medical History  Diagnosis Date  . Anxiety   . Back pain   . GERD (gastroesophageal reflux disease)   . Peripheral neuropathy   . Arthritis   . CHF (congestive heart failure)   . Active smoker   . ESRD on hemodialysis     [name] 4 days per week on M-Tu-Wed and Fri.  Started HD in 1998 and has been on HD initially at [facility], then went  to [facility], then in [facility], then to [facility] and now is at [facility] for last 10 years.  Has L thigh AVG.     . Diabetes mellitus     diet controlled  . 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 PONV (postoperative nausea and vomiting)   . Hypertension   . Bell palsy   . Heart murmur   . Carpal tunnel syndrome     bilateral  . Dysrhythmia     Hx: of palpitataions  . Headache(784.0)     Hx: of Migraines    Medications Prior to Admission  Medication Sig Dispense Refill  . aspirin 81 MG tablet Take 1 tablet (81 mg total) by mouth daily.    Marland Kitchen atorvastatin (LIPITOR) 80 MG tablet Take 1 tablet (80 mg total) by mouth daily at 6 PM. 30 tablet 11  . b complex-vitamin c-folic acid (NEPHRO-VITE) 0.8 MG TABS Take 0.8 mg by mouth at bedtime.     . calcium carbonate (TUMS - DOSED IN MG ELEMENTAL CALCIUM) 500 MG chewable tablet Chew 3 tablets by mouth 3 (three) times daily.    . colchicine 0.6 MG tablet Take 0.6 mg by mouth 2 (two) times daily as needed. For gout    . esomeprazole (NEXIUM) 40 MG capsule Take 40 mg by mouth every evening.     . gabapentin (NEURONTIN) 100 MG capsule Take 1 capsule by mouth 2 (two) times daily as needed.    . latanoprost (XALATAN) 0.005 % ophthalmic solution Place 1 drop  into both eyes at bedtime.   2  . loperamide (IMODIUM) 2 MG capsule Take 4 mg by mouth as needed for diarrhea or loose stools.    . metoprolol tartrate (LOPRESSOR) 25 MG tablet Take 1 tablet (25 mg total) by mouth 2 (two) times daily. (Patient taking differently: Take 25 mg by mouth daily. ) 60 tablet 11  . naproxen sodium (ANAPROX) 220 MG tablet Take 440 mg by mouth 2 (two) times daily with a meal.    . oxyCODONE (OXY IR/ROXICODONE) 5 MG immediate release tablet Take 1 tablet (5 mg total) by mouth every 6 (six) hours as needed for moderate pain. 30 tablet 0  . ticagrelor (BRILINTA) 90 MG TABS tablet Take 90 mg by mouth 2 (two) times daily.    . traZODone (DESYREL) 50 MG tablet Take 50 mg by mouth at bedtime as needed for sleep.     . cephALEXin (KEFLEX) 500 MG capsule Take 500 mg by mouth every 8 (eight) hours.    . nitroGLYCERIN (NITROSTAT) 0.4 MG SL tablet Place 1 tablet (0.4 mg total) under the tongue every 5 (five) minutes as needed for chest pain. 25 tablet 3  . prasugrel (EFFIENT) 10 MG TABS tablet Take 1 tablet (10 mg total) by mouth daily. (Patient not taking: Reported on 06/19/2014) 30 tablet 6  . sodium bicarbonate 650 MG tablet Take 650 mg by mouth 2 (two) times daily.      . sulindac (CLINORIL) 200 MG tablet Take 200 mg by mouth 2 (two) times daily.       Marland Kitchen atorvastatin  80 mg Oral q1800  . calcium carbonate  3 tablet Oral TID  . gabapentin  100 mg Oral BID  . heparin subcutaneous  5,000 Units Subcutaneous 3 times per day  . LORazepam  1 mg Intravenous Once  . metoprolol tartrate  25 mg Oral BID  . multivitamin  1  tablet Oral QHS  . pantoprazole  80 mg Oral Q1200  . sodium bicarbonate  650 mg Oral BID  . sodium chloride  3 mL Intravenous Q12H  . sodium chloride  3 mL Intravenous Q12H  . ticagrelor  90 mg Oral BID  . warfarin  7.5 mg Oral ONCE-1800  . warfarin   Does not apply Once  . Warfarin - Pharmacist Dosing Inpatient   Does not apply q1800    Infusions:    No Known  Allergies  History   Social History  . Marital Status: Single    Spouse Name: N/A  . Number of Children: N/A  . Years of Education: N/A   Occupational History  . Not on file.   Social History Main Topics  . Smoking status: Current Every Day Smoker -- 1.00 packs/day for 40 years    Types: Cigarettes  . Smokeless tobacco: Never Used  . Alcohol Use: No  . Drug Use: No  . Sexual Activity: No   Other Topics Concern  . Not on file   Social History Narrative    Family History  Problem Relation Age of Onset  . Diabetes Mother   . Stroke Mother   . Heart disease Mother   . Kidney disease Father   . Hyperlipidemia Father     PHYSICAL EXAM: Filed Vitals:   06/22/14 1012  BP: 115/76  Pulse: 67  Temp:   Resp:      Intake/Output Summary (Last 24 hours) at 06/22/14 1231 Last data filed at 06/22/14 0208  Gross per 24 hour  Intake    640 ml  Output   4000 ml  Net  -3360 ml    General:  Lying in bed. Unable to sit up due to back pain HEENT: normal Neck: supple. thick hard to see JVP. Carotids 2+ bilat; no bruits. No lymphadenopathy or thryomegaly appreciated. Cor: Distant Regular rate & rhythm. No rubs, gallops or murmurs. Lungs: clear Abdomen: obese soft, nontender, nondistended. No hepatosplenomegaly. No bruits or masses. Good bowel sounds. Extremities: no cyanosis, clubbing, rash, edema. + HD grafts in both thighs and arms Neuro: alert & oriented x 3, cranial nerves grossly intact. Affect pleasant  ECG: NSR 74 inferior qs. No ST-T wave abnormalities.    Results for orders placed or performed during the hospital encounter of 06/19/14 (from the past 24 hour(s))  Glucose, capillary     Status: Abnormal   Collection Time: 06/21/14 12:59 PM  Result Value Ref Range   Glucose-Capillary 110 (H) 70 - 99 mg/dL  Protime-INR     Status: None   Collection Time: 06/21/14  5:00 PM  Result Value Ref Range   Prothrombin Time 13.6 11.6 - 15.2 seconds   INR 1.03 0.00 - 1.49    Glucose, capillary     Status: None   Collection Time: 06/21/14  6:16 PM  Result Value Ref Range   Glucose-Capillary 89 70 - 99 mg/dL  Glucose, capillary     Status: Abnormal   Collection Time: 06/21/14  8:57 PM  Result Value Ref Range   Glucose-Capillary 123 (H) 70 - 99 mg/dL  Glucose, capillary     Status: None   Collection Time: 06/22/14  7:44 AM  Result Value Ref Range   Glucose-Capillary 87 70 - 99 mg/dL  Basic metabolic panel     Status: Abnormal   Collection Time: 06/22/14  8:20 AM  Result Value Ref Range   Sodium 133 (L) 135 - 145 mmol/L  Potassium 5.3 (H) 3.5 - 5.1 mmol/L   Chloride 92 (L) 96 - 112 mmol/L   CO2 26 19 - 32 mmol/L   Glucose, Bld 89 70 - 99 mg/dL   BUN 48 (H) 6 - 23 mg/dL   Creatinine, Ser 1.61 (H) 0.50 - 1.35 mg/dL   Calcium 7.4 (L) 8.4 - 10.5 mg/dL   GFR calc non Af Amer 5 (L) >90 mL/min   GFR calc Af Amer 6 (L) >90 mL/min   Anion gap 15 5 - 15  Protime-INR     Status: None   Collection Time: 06/22/14  8:20 AM  Result Value Ref Range   Prothrombin Time 13.9 11.6 - 15.2 seconds   INR 1.05 0.00 - 1.49   No results found.   ASSESSMENT: 1. RLE superficial thrombus 2. CAD with DES to LAD 1/15 3. ESRD 4. DM2 5. Ongoing tobacco use 6. Severe back and leg pain  PLAN/DISCUSSION:;  He is now over 1 year out from his MI and stent. If warfarin anti-coagulation is felt to be indicated for superficial thrombus would stop Brilinta and treat with ASA 81 and wrafarin. If warfarin not indicated would continue current regimen.   Will sign off. Please do not hesitate to contact us with questions.   Teila Skalsky,MD 12:41 PM

## 2014-06-22 NOTE — Progress Notes (Signed)
Internal Medicine Attending  Date: 06/22/2014  Patient name: [name] Medical record number: [id number] Date of birth: 11-Feb-1956 Age: 59 y.o. Gender: male  I saw and evaluated the patient. I discussed patient and reviewed the resident's note by Dr. Danella Penton, and I agree with the resident's findings and plans as documented in her note.

## 2014-06-22 NOTE — Progress Notes (Signed)
Subjective:   Still having a lot of back pain  Objective Filed Vitals:   06/21/14 1815 06/21/14 2100 06/22/14 0609 06/22/14 0745  BP: 134/84 102/68 127/61 93/72  Pulse: 75 73 69 66  Temp: 98.2 F (36.8 C) 98.6 F (37 C) 98.3 F (36.8 C) 98 F (36.7 C)  TempSrc: Oral Oral Oral Oral  Resp: 18 19 18 18   Weight:      SpO2: 100% 98% 97% 95%   Physical Exam General: alert and oriented. No acute distress.  Heart: RRR Lungs: CTA, unlabored  Abdomen: obese. Soft, nontender +BS Extremities: no edema Dialysis Access: L thigh AVG + b/t  HD: MTuWF [facility] 3.5h 450/A1.5 116kg 2/3.5 Bath Heparin 11K L thigh AVG No meds  Assessment/Plan: 1. Acute-on-chronic back and RLE pain - difficulty with pain control affecting HD. Lumbar spine- degenerative changes. Unable to obtain MRI- could not fit in machine.  2. Right LE thrombus- acute superficial thrombus in the rt greater saphenous vein- Dr Rennis Golden to manage coumadin outpt 3. ESRD - HD on MTuWF @ [facility], K 5.3 xtra HD yesterday- will run tomorrow then back on schedule monday  4. Hx multiple access failures - now with L thigh AVG 5. HTN/Volume - BP 127/61 on Metoprolol 25 mg bid; at EDW 6. Anemia - Hgb 14.3, no meds. 7. 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 8. Nutrition - Alb 3, renal carb-mod diet, vitamin. 9. Hx 3-vessel CAD - inferior STEMI, deemed poor candidate for surgery in 04/2013.  Jetty Duhamel, NP [facility] [phone] 06/22/2014,9:33 AM  LOS: 2 days   Pt seen, examined and agree w A/P as above. Have d/w primary possibility of using long-acting pain meds to better control pain.  Pain is not well controlled from what I can see and adding long-acting medication would likely help [place] to dialyze him more effectively.  Vinson Moselle MD pager [phone]    cell [phone] 06/22/2014, 3:40 PM     Additional Objective Labs: Basic Metabolic  Panel:  Recent Labs Lab 06/19/14 1546 06/20/14 0545 06/20/14 1608 06/21/14 1034 06/22/14 0820  NA 137 137 138 135 133*  K 5.8* 7.0* 4.2 5.9* 5.3*  CL 96 94* 96 97 92*  CO2 19 16* 24 24 26   GLUCOSE 120* 72 128* 97 89  BUN 131* 140* 60* 76* 48*  CREATININE 16.01* 16.03* 9.77* 12.66* 9.73*  CALCIUM 6.2* 6.2* 7.8* 7.0* 7.4*  PHOS 12.0* 14.4* 7.9*  --   --    Liver Function Tests:  Recent Labs Lab 06/19/14 1546 06/20/14 0545 06/20/14 1608  AST 28  --   --   ALT 16  --   --   ALKPHOS 49  --   --   BILITOT 1.0  --   --   PROT 6.9  --   --   ALBUMIN 3.0*  3.0* 2.9* 3.0*   No results for input(s): LIPASE, AMYLASE in the last 168 hours. CBC:  Recent Labs Lab 06/19/14 1307  06/20/14 0545 06/20/14 1608 06/21/14 1034  WBC 17.9*  --  15.2* 11.8* 14.1*  NEUTROABS 13.7*  --   --   --  10.3*  HGB 14.9  < > 13.8 14.3 14.3  HCT 43.8  < > 41.2 42.1 43.5  MCV 92.2  --  92.6 90.0 94.0  PLT 190  --  201 161 163  < > = values in this interval not displayed. Blood  Culture    Component Value Date/Time   SDES WOUND ARM LEFT 02/16/2013 0811   SDES WOUND ARM LEFT 02/16/2013 0811   SPECREQUEST ARTERIOVENOUS GORTEX GRAFT SITE PT ON ZINACEF 02/16/2013 0811   SPECREQUEST ARTERIOVENOUS GORTEX GRAFT SITE PT ON ZINACEF 02/16/2013 0811   CULT  02/16/2013 0811    NO ANAEROBES ISOLATED Performed at [facility]   CULT  02/16/2013 0811    NO GROWTH 2 DAYS Performed at [facility]   REPTSTATUS 02/21/2013 FINAL 02/16/2013 0811   REPTSTATUS 02/18/2013 FINAL 02/16/2013 0811    Cardiac Enzymes: No results for input(s): CKTOTAL, CKMB, CKMBINDEX, TROPONINI in the last 168 hours. CBG:  Recent Labs Lab 06/21/14 0807 06/21/14 1259 06/21/14 1816 06/21/14 2057 06/22/14 0744  GLUCAP 104* 110* 89 123* 87   Iron Studies: No results for input(s): IRON, TIBC, TRANSFERRIN, FERRITIN in the last 72 hours. @ Studies/Results: No results found. Medications:   .  atorvastatin  80 mg Oral q1800  . calcium carbonate  3 tablet Oral TID  . gabapentin  100 mg Oral BID  . heparin subcutaneous  5,000 Units Subcutaneous 3 times per day  . LORazepam  1 mg Intravenous Once  . metoprolol tartrate  25 mg Oral BID  . multivitamin  1 tablet Oral QHS  . pantoprazole  80 mg Oral Q1200  . sodium bicarbonate  650 mg Oral BID  . sodium chloride  3 mL Intravenous Q12H  . sodium chloride  3 mL Intravenous Q12H  . ticagrelor  90 mg Oral BID  . warfarin   Does not apply Once  . Warfarin - Pharmacist Dosing Inpatient   Does not apply [phone]

## 2014-06-22 NOTE — Progress Notes (Signed)
Physical Therapy Treatment Patient Details Name: [name] MRN: [id number] DOB: 1956-04-21 Today's Date: 06/22/2014    History of Present Illness Patient is a 59 year old man with history of end-stage renal disease on hemodialysis, diabetes mellitus, hypertension, chronic back pain, and other problems as outlined in the medical history who reports pain in his right lower back radiating into his right lower extremity which is worse during hemodialysis sessions. The pain is so severe that he missed one recent HD session and cut short another.    PT Comments    Pt continues to be significantly limited by pain. Was not able to achieve full upright positioning with transfers. Pt reports most of his pain is in his R knee, and pt was particularly painful with palpation to hamstring insertion behind knee, and hamstrings muscle bellies. Will continue to follow and progress as able per POC.   Follow Up Recommendations  Home health PT;Supervision/Assistance - 24 hour     Equipment Recommendations  None recommended by PT    Recommendations for Other Services       Precautions / Restrictions Precautions Precautions: Fall Precaution Comments: Pt needs education on back precautions to minimize pain during transfers Restrictions Weight Bearing Restrictions: No    Mobility  Bed Mobility Overal bed mobility: Modified Independent             General bed mobility comments: Use of bed rails and increased time required. Pt with difficulty maintaining upright sitting posture at first due to pain. Required UE support.   Transfers Overall transfer level: Needs assistance Equipment used: Rolling walker (2 wheeled) Transfers: Sit to/from [employer] Sit to Stand: Min assist Stand pivot transfers: Min assist       General transfer comment: Assist for balance and safety. Pt impulsive with intiiating transfers due to pain - especially to stand up to alleviate some of his pain.  Pt was unable to achieve full stand with upright posture. Pt declined use of the walker and his focus was to transfer from [facility] back to bed to get lying down as soon as possible.   Ambulation/Gait             General Gait Details: Unable to attempt due to pain.    Stairs            Wheelchair Mobility    Modified Rankin (Stroke Patients Only)       Balance Overall balance assessment: Needs assistance Sitting-balance support: Feet supported;No upper extremity supported Sitting balance-Leahy Scale: Poor Sitting balance - Comments: Requiores UE support due to pain   Standing balance support: Bilateral upper extremity supported Standing balance-Leahy Scale: Poor                      Cognition Arousal/Alertness: Awake/alert Behavior During Therapy: Restless;Anxious;Impulsive (with pain) Overall Cognitive Status: Within Functional Limits for tasks assessed                      Exercises      General Comments        Pertinent Vitals/Pain Pain Assessment: Faces Faces Pain Scale: Hurts whole lot Pain Location: Low back radiating into R leg Pain Descriptors / Indicators: Aching;Grimacing;Radiating Pain Intervention(s): Limited activity within patient's tolerance;Monitored during session;Repositioned    Home Living                      Prior Function  PT Goals (current goals can now be found in the care plan section) Acute Rehab PT Goals Patient Stated Goal: Decrease his pain PT Goal Formulation: With patient Time For Goal Achievement: 07/05/14 Potential to Achieve Goals: Good Progress towards PT goals: Not progressing toward goals - comment    Frequency  Min 3X/week    PT Plan Current plan remains appropriate    Co-evaluation             End of Session Equipment Utilized During Treatment: Gait belt Activity Tolerance: Patient limited by pain Patient left: in bed;with call bell/phone within reach      Time: 1115-1130 PT Time Calculation (min) (ACUTE ONLY): 15 min  Charges:  $Therapeutic Activity: 8-22 mins                    G Codes:      Conni Slipper 06-27-2014, 1:44 PM   Conni Slipper, PT, DPT Acute Rehabilitation Services Pager: [phone]

## 2014-06-22 NOTE — Progress Notes (Signed)
Pt expressed pain at 10/10 to lower back and that current regimen of oxy IR 5mg  q 6 was ineffective in treating his pain. Page to attending service with call back from Dr. Tasia Catchings. New order to epic for adjustment. Will continue to monitor. Dondra Spry, RN

## 2014-06-22 NOTE — Consult Note (Signed)
[facility] [place] HISTORY AND PHYSICAL   History of Present Illness:  Patient is a 59 y.o. year old male who presents for evaluation of pain right leg.  Pt found to have thrombus at saphenofemoral junction.  Pt has no swelling in his leg.  He states pain has been present several weeks and the entire leg hurts.  The pain is present whether sitting standing walking or lying in bed.  He states he is unable to bear wt on the leg.  Other medical problems include chronic back pain, CAD, ESRD, hypertension.  The patient has had multilple thigh dialysis grafts and all leg grafts have been abandoned on the right side.    Past Medical History  Diagnosis Date  . Anxiety   . Back pain   . GERD (gastroesophageal reflux disease)   . Peripheral neuropathy   . Arthritis   . CHF (congestive heart failure)   . Active smoker   . ESRD on hemodialysis     [name] 4 days per week on M-Tu-Wed and Fri.  Started HD in 1998 and has been on HD initially at [facility], then went to [facility], then in [facility], then to [facility] and now is at [facility] for last 10 years.  Has L thigh AVG.     . Diabetes mellitus     diet controlled  . 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 PONV (postoperative nausea and vomiting)   . Hypertension   . Bell palsy   . Heart murmur   . Carpal tunnel syndrome     bilateral  . Dysrhythmia     Hx: of palpitataions  . Headache(784.0)     Hx: of Migraines    Past Surgical History  Procedure Laterality Date  . Total hip arthroplasty    . Thyroidectomy    . Tooth extraction    . Mandible fracture surgery    . Dg av dialysis graft declot or    . Insertion of dialysis catheter  03/28/2011    Procedure: INSERTION OF DIALYSIS CATHETER;  Surgeon: Pryor Ochoa, MD;  Location: [facility] OR;  Service: Vascular;  Laterality: Left;  . Av fistula placement  03/31/2011    Procedure: INSERTION OF ARTERIOVENOUS (AV) GORE-TEX GRAFT THIGH;  Surgeon: Sherren Kerns, MD;  Location: [facility] OR;  Service: Vascular;  Laterality: Right;  redo right thigh arteriovenous gortex graft using gore-tex stretch 6mm x 70cm  . Thrombectomy w/ embolectomy  06/02/2011    Procedure: THROMBECTOMY ARTERIOVENOUS GORE-TEX GRAFT;  Surgeon: Chuck Hint, MD;  Location: [facility] OR;  Service: Vascular;  Laterality: Right;  Thrombectomy right thigh arteriovenous gortex graft;  revision by  replacement of large portion of graft with 7mm gore-tex   . Insertion of dialysis catheter  08/28/2011    Procedure: INSERTION OF DIALYSIS CATHETER;  Surgeon: Fransisco Hertz, MD;  Location: [facility] OR;  Service: Vascular;  Laterality: Left;  Atempted Bilateral Internal Jugular, Bilateral Subclavin insertion of 55cm Dialysis Catheter Left Femoral  . Insertion of dialysis catheter  12/15/2011    Procedure: INSERTION OF DIALYSIS CATHETER;  Surgeon: Sherren Kerns, MD;  Location: [facility] OR;  Service: Vascular;  Laterality: Right;  Insertion of Right Femoral Dialysis Catheter  . Exchange of a dialysis catheter  02/26/2012    Procedure: EXCHANGE OF A DIALYSIS CATHETER;  Surgeon: Larina Earthly, MD;  Location: [facility] OR;  Service: Vascular;  Laterality: Right;  . Joint replacement    . Exchange of a dialysis catheter  05/18/2012    Procedure: EXCHANGE OF A DIALYSIS CATHETER;  Surgeon: Larina Earthly, MD;  Location: [facility] OR;  Service: Vascular;  Laterality: Right;  right femoral dialysis catheter  . Av fistula placement  05/18/2012    Procedure: INSERTION OF ARTERIOVENOUS (AV) GORE-TEX GRAFT THIGH;  Surgeon: Larina Earthly, MD;  Location: [facility] OR;  Service: Vascular;  Laterality: Left;  using 6mm by 70cm goretex graft  . Thrombectomy w/ embolectomy Left 08/25/2012    Procedure: THROMBECTOMY ARTERIOVENOUS GORE-TEX GRAFT;  Surgeon: Pryor Ochoa, MD;  Location: [facility] OR;  Service: Vascular;  Laterality: Left;  Attempted thrombectomy of left thigh arteriovenous gortex graft.   . Av fistula placement Left 08/25/2012    Procedure: INSERTION OF  ARTERIOVENOUS (AV) GORE-TEX GRAFT THIGH;  Surgeon: Pryor Ochoa, MD;  Location: [facility] OR;  Service: Vascular;  Laterality: Left;  Using 6mm x 40cm vascular Gortex graft.  . Revision of arteriovenous goretex graft Left 12/14/2012    Procedure: Revision of Left Thigh Graft;  Surgeon: Larina Earthly, MD;  Location: [facility] OR;  Service: Vascular;  Laterality: Left;  . Avgg removal Left 02/16/2013    Procedure: EXCISION OF LEFT ARM ARTERIOVENOUS GORETEX GRAFT TIMES 2 WITH VEIN PATCH ANGIOPLASTY OF BRACIAL ARTERY.  ;  Surgeon: Chuck Hint, MD;  Location: [facility] OR;  Service: Vascular;  Laterality: Left;  Converted from MAC to General.    . Revision of arteriovenous goretex graft Left 08/08/2013    Procedure: REVISION OF LEFT THIGH ARTERIOVENOUS GORETEX GRAFT;  Surgeon: Sherren Kerns, MD;  Location: [facility] OR;  Service: Vascular;  Laterality: Left;  . Venogram Bilateral 10/27/2011    Procedure: VENOGRAM;  Surgeon: Nada Libman, MD;  Location: [facility] CATH LAB;  Service: Cardiovascular;  Laterality: Bilateral;  bilat upper extrem venograms  . Left heart cath Bilateral 05/21/2013    Procedure: LEFT HEART CATH;  Surgeon: Iran Ouch, MD;  Location: [facility] CATH LAB;  Service: Cardiovascular;  Laterality: Bilateral;  . Percutaneous coronary stent intervention (pci-s)  05/21/2013    Procedure: PERCUTANEOUS CORONARY STENT INTERVENTION (PCI-S);  Surgeon: Iran Ouch, MD;  Location: [facility] CATH LAB;  Service: Cardiovascular;;  . Coronary angioplasty with stent placement      Social History History  Substance Use Topics  . Smoking status: Current Every Day Smoker -- 1.00 packs/day for 40 years    Types: Cigarettes  . Smokeless tobacco: Never Used  . Alcohol Use: No    Family History Family History  Problem Relation Age of Onset  . Diabetes Mother   . Stroke Mother   . Heart disease Mother   . Kidney disease Father   . Hyperlipidemia Father     Allergies  No Known Allergies   Current  Facility-Administered Medications  Medication Dose Route Frequency Provider Last Rate Last Dose  . atorvastatin (LIPITOR) tablet 80 mg  80 mg Oral q1800 Yolanda Manges, DO   80 mg at 06/22/14 1920  . calcium carbonate (TUMS - dosed in mg elemental calcium) chewable tablet 600 mg of elemental calcium  3 tablet Oral TID Yolanda Manges, DO   600 mg of elemental calcium at 06/22/14 1920  . gabapentin (NEURONTIN) capsule 100 mg  100 mg Oral BID Yolanda Manges, DO   100 mg at 06/22/14 1017  . heparin injection 5,000 Units  5,000 Units Subcutaneous 3 times per day Trinna Post  Elson Clan, DO   5,000 Units at 06/22/14 1513  . metoprolol tartrate (LOPRESSOR) tablet 25 mg  25 mg Oral BID Yolanda Manges, DO   25 mg at 06/22/14 1017  . multivitamin (RENA-VIT) tablet 1 tablet  1 tablet Oral QHS Yolanda Manges, DO   1 tablet at 06/21/14 2142  . oxyCODONE (Oxy IR/ROXICODONE) immediate release tablet 5-10 mg  5-10 mg Oral Q4H PRN Hyacinth Meeker, MD   10 mg at 06/22/14 2031  . pantoprazole (PROTONIX) EC tablet 80 mg  80 mg Oral Q1200 Yolanda Manges, DO   80 mg at 06/22/14 1514  . sodium bicarbonate tablet 650 mg  650 mg Oral BID Yolanda Manges, DO   650 mg at 06/22/14 1017  . sodium chloride 0.9 % injection 3 mL  3 mL Intravenous Q12H Yolanda Manges, DO   3 mL at 06/22/14 0858  . sodium chloride 0.9 % injection 3 mL  3 mL Intravenous Q12H Yolanda Manges, DO   3 mL at 06/20/14 2103  . ticagrelor (BRILINTA) tablet 90 mg  90 mg Oral BID Yolanda Manges, DO   90 mg at 06/22/14 1017  . warfarin (COUMADIN) video   Does not apply Once Benny Lennert, [facility]      . Warfarin - Pharmacist Dosing Inpatient   Does not apply q1800 Benny Lennert, RPH   0  at 06/22/14 1800    ROS:   General:  No weight loss, Fever, chills  HEENT: No recent headaches, no nasal bleeding, no visual changes, no sore throat  Neurologic: No dizziness, blackouts, seizures. No recent symptoms of stroke or mini- stroke. No recent episodes of slurred speech, or  temporary blindness.  Cardiac: No recent episodes of chest pain/pressure, no shortness of breath at rest.  No shortness of breath with exertion.  Denies history of atrial fibrillation or irregular heartbeat  Vascular: No history of rest pain in feet.  No history of claudication.  No history of non-healing ulcer, No history of DVT   Pulmonary: No home oxygen, no productive cough, no hemoptysis,  No asthma or wheezing  Musculoskeletal:  [ x] Arthritis, [x ] Low back pain,  [x ] Joint pain  Hematologic:No history of hypercoagulable state.  No history of easy bleeding.  No history of anemia  Gastrointestinal: No hematochezia or melena,  No gastroesophageal reflux, no trouble swallowing  Urinary: [x ] chronic Kidney disease, [x ] on HD -  MWF or  TTHS,  Burning with urination,  Frequent urination,  Difficulty urinating;   Skin: No rashes  Psychological: + history of anxiety,  + history of depression   Physical Examination  Filed Vitals:   06/21/14 2100 06/22/14 0609 06/22/14 0745 06/22/14 1012  BP: 102/68 127/61 93/72 115/76  Pulse: 73 69 66 67  Temp: 98.6 F (37 C) 98.3 F (36.8 C) 98 F (36.7 C)   TempSrc: Oral Oral Oral   Resp: Weight:      SpO2: 98% 97% 95%     Body mass index is 35.84 kg/(m^2).  General:  Alert and oriented, no acute distress HEENT: Normal Pulmonary: Clear to auscultation bilaterally Cardiac: Regular Rate and Rhythm  Abdomen: Soft, non-tender, non-distended, no mass Skin: No rash Extremity Pulses:  2+ radial, brachial, femoral, absent dorsalis pedis, posterior tibial pulses right foot Musculoskeletal: No deformity or edema  Neurologic: Upper and lower extremity motor 5/5 and symmetric  DATA:  Duplex reviewed possible saphenofemoral thrombus   ASSESSMENT:  Usually after multiple thrombectomy and revision the saphenous is no longer in continuity and the common femoral/external iliac vein is narrowed as well.  Not sure if his  pain is from this as he is not really tender over the course of the saphenous.     PLAN:  Risks of coumadin probably not worth benefit for this as the vein may not actually be in continuity.  Would schedule patient for follow up duplex in 2 weeks to make sure no propagation.  If it has not progressed or no swelling would treat conservatively with warm compresses/ice/anti inflammatories.  Fabienne Bruns, MD [facility] [place] Office: [phone] Pager: [phone]

## 2014-06-23 LAB — RENAL FUNCTION PANEL
ALBUMIN: 3.1 g/dL — AB (ref 3.5–5.2)
Anion gap: 13 (ref 5–15)
BUN: 48 mg/dL — AB (ref 6–23)
CHLORIDE: 94 mmol/L — AB (ref 96–112)
CO2: 27 mmol/L (ref 19–32)
Calcium: 7.4 mg/dL — ABNORMAL LOW (ref 8.4–10.5)
Creatinine, Ser: 8.73 mg/dL — ABNORMAL HIGH (ref 0.50–1.35)
GFR, EST AFRICAN AMERICAN: 7 mL/min — AB (ref >90)
GFR, EST NON AFRICAN AMERICAN: 6 mL/min — AB (ref >90)
GLUCOSE: 89 mg/dL (ref 70–99)
PHOSPHORUS: 5.8 mg/dL — AB (ref 2.3–4.6)
Potassium: 4.4 mmol/L (ref 3.5–5.1)
Sodium: 134 mmol/L — ABNORMAL LOW (ref 135–145)

## 2014-06-23 LAB — BASIC METABOLIC PANEL
Anion gap: 14 (ref 5–15)
BUN: 77 mg/dL — AB (ref 6–23)
CALCIUM: 6.6 mg/dL — AB (ref 8.4–10.5)
CO2: 21 mmol/L (ref 19–32)
CREATININE: 11.78 mg/dL — AB (ref 0.50–1.35)
Chloride: 95 mmol/L — ABNORMAL LOW (ref 96–112)
GFR, EST AFRICAN AMERICAN: 5 mL/min — AB (ref >90)
GFR, EST NON AFRICAN AMERICAN: 4 mL/min — AB (ref >90)
GLUCOSE: 78 mg/dL (ref 70–99)
POTASSIUM: 6 mmol/L — AB (ref 3.5–5.1)
SODIUM: 130 mmol/L — AB (ref 135–145)

## 2014-06-23 LAB — GLUCOSE, CAPILLARY
GLUCOSE-CAPILLARY: 160 mg/dL — AB (ref 70–99)
GLUCOSE-CAPILLARY: 90 mg/dL (ref 70–99)
Glucose-Capillary: 85 mg/dL (ref 70–99)
Glucose-Capillary: 88 mg/dL (ref 70–99)

## 2014-06-23 LAB — CBC
HEMATOCRIT: 46.1 % (ref 39.0–52.0)
Hemoglobin: 15.2 g/dL (ref 13.0–17.0)
MCH: 30.5 pg (ref 26.0–34.0)
MCHC: 33 g/dL (ref 30.0–36.0)
MCV: 92.4 fL (ref 78.0–100.0)
Platelets: 163 10*3/uL (ref 150–400)
RBC: 4.99 MIL/uL (ref 4.22–5.81)
RDW: 15.7 % — ABNORMAL HIGH (ref 11.5–15.5)
WBC: 15.8 10*3/uL — AB (ref 4.0–10.5)

## 2014-06-23 LAB — PROTIME-INR
INR: 1.29 (ref 0.00–1.49)
PROTHROMBIN TIME: 16.2 s — AB (ref 11.6–15.2)

## 2014-06-23 MED ORDER — OXYCODONE HCL 5 MG PO TABS
ORAL_TABLET | ORAL | Status: AC
  Filled 2014-06-23: qty 2

## 2014-06-23 MED ORDER — CALCIUM CARBONATE ANTACID 500 MG PO CHEW
400.0000 mg | CHEWABLE_TABLET | Freq: Once | ORAL | Status: AC
  Administered 2014-06-23: 400 mg via ORAL

## 2014-06-23 MED ORDER — ASPIRIN EC 81 MG PO TBEC
81.0000 mg | DELAYED_RELEASE_TABLET | Freq: Every day | ORAL | Status: DC
  Administered 2014-06-23 – 2014-06-28 (×6): 81 mg via ORAL
  Filled 2014-06-23 (×7): qty 1

## 2014-06-23 MED ORDER — FENTANYL 50 MCG/HR TD PT72
50.0000 ug | MEDICATED_PATCH | TRANSDERMAL | Status: DC
Start: 2014-06-23 — End: 2014-06-28
  Administered 2014-06-23 – 2014-06-26 (×2): 50 ug via TRANSDERMAL
  Filled 2014-06-23 (×2): qty 1

## 2014-06-23 NOTE — Progress Notes (Signed)
Subjective: Pt does not remember going to the MRI machine yesterday. Denies current back pain. Was previously in HD this morning which was cut short due to right hip and leg pain. When asked about this patient states that HD made lower back and rt leg pain worse however states pain is better now. Able to do b/l straight leg test that was negative and pt had difficulty with internal rotation of rt leg but states that pain was at his knee. Crepitus was felt on rt knee during internal rotation of rt hip.   Objective: Vital signs in last 24 hours: Filed Vitals:   06/22/14 2119 06/23/14 0449 06/23/14 0657 06/23/14 0715  BP: 99/60 105/78 148/68 123/72  Pulse: 69 66 68 64  Temp: 98.3 F (36.8 C) 97.9 F (36.6 C) 97.5 F (36.4 C)   TempSrc: Oral Oral Oral   Resp: Weight: 262 lb 5.6 oz (119 kg)  265 lb 6.9 oz (120.4 kg)   SpO2: 100% 100% 100%    Weight change: 5 lb 8.2 oz (2.5 kg) No intake or output data in the 24 hours ending 06/23/14 0737 General: laying on back comfortably eating cereal.  Lungs: CTAB, no wheezing Cardiac: RRR, no murmurs GI: soft, active bowel sounds, non TTP Neuro: CN II-XII grossly intact Ext: neg for pedal edema MSK:  Able to do b/l straight leg test that was negative and pt had difficulty with internal rotation of rt leg but states that pain was at his knee. Crepitus was felt on rt knee during internal rotation of rt hip.   Lab Results: Basic Metabolic Panel:  Recent Labs Lab 06/20/14 0545 06/20/14 1608 06/21/14 1034 06/22/14 0820  NA 137 138 135 133*  K 7.0* 4.2 5.9* 5.3*  CL 94* 96 97 92*  CO2 16* GLUCOSE 72 128* 97 89  BUN 140* 60* 76* 48*  CREATININE 16.03* 9.77* 12.66* 9.73*  CALCIUM 6.2* 7.8* 7.0* 7.4*  PHOS 14.4* 7.9*  --   --    Liver Function Tests:  Recent Labs Lab 06/19/14 1546 06/20/14 0545 06/20/14 1608  AST 28  --   --   ALT 16  --   --   ALKPHOS 49  --   --   BILITOT 1.0  --   --   PROT 6.9  --   --     ALBUMIN 3.0*  3.0* 2.9* 3.0*   CBC:  Recent Labs Lab 06/19/14 1307  06/20/14 1608 06/21/14 1034  WBC 17.9*  < > 11.8* 14.1*  NEUTROABS 13.7*  --   --  10.3*  HGB 14.9  < > 14.3 14.3  HCT 43.8  < > 42.1 43.5  MCV 92.2  < > 90.0 94.0  PLT 190  < > 161 163  < > = values in this interval not displayed. CBG:  Recent Labs Lab 06/21/14 0807 06/21/14 1259 06/21/14 1816 06/21/14 2057 06/22/14 0744 06/22/14 1752  GLUCAP 104* 110* 89 123* 87 83    Studies/Results: No results found. Medications: I have reviewed the patient's current medications. Scheduled Meds: . atorvastatin  80 mg Oral q1800  . calcium carbonate  3 tablet Oral TID  . gabapentin  100 mg Oral BID  . heparin subcutaneous  5,000 Units Subcutaneous 3 times per day  . metoprolol tartrate  25 mg Oral BID  . multivitamin  1 tablet Oral QHS  . oxyCODONE      . pantoprazole  80 mg Oral Q1200  . sodium bicarbonate  650 mg Oral BID  . sodium chloride  3 mL Intravenous Q12H  . sodium chloride  3 mL Intravenous Q12H  . ticagrelor  90 mg Oral BID  . warfarin   Does not apply Once  . Warfarin - Pharmacist Dosing Inpatient   Does not apply q1800   Continuous Infusions:  PRN Meds:.oxyCODONE Assessment/Plan: Principal Problem:   Right leg pain Active Problems:   End-stage renal disease on hemodialysis   HTN (hypertension)   Radicular leg pain   Right-sided low back pain with right-sided sciatica   Acute superficial venous thrombosis of lower extremity   Chronic lower back pain with worsening of rt lower back and rt leg pain--  Lumbar spine xray revealed degenerative changes and xray of rt hip neg for for acute changes.  -  Pain being managed with oxycodone 10mg  q4h prn for pain which seems to be adequately controlling pain.  - per nephrology may need pre HD bolus of short acting pain meds with or without long acting pain meds for baseline since narcotics are drawn off with HD. - will hold off on starting long  acting pain medication this admission and d/c pt home on oxycodone 10mg  q4h prn for pain as needed (was previously on oxy 5mg  at home). Pt's PCP is Dr. Briant Cedar who he sees during his HD session. - unable to obtain MRI lumbar spine again yesterday as patient was unable to lay flat despite being given 1mg  ativan IV prior to procedure, pt this morning states that he does not recall going down to the MRI scanner yesterday. Will order outpatient open MRI lumbar spine as outpatient. Likely pt can go home today if potassium is WNL and physical therapy has seen him today prior to d/c.   Superficial Rt LE thrombus-- LE dopplers positive for acute superficial thrombus in the rt greater saphenous vein beginning at the rt saphanofemoral junction.   - Dr. Gala Romney w/ cardiology saw patient regarding concurrent asa and brilinta use for hx of stent. Recommended discontinuing brilinta if coumadin is continued for SVT.  - Dr. Darrick Penna w/ vascular surgery saw patient last night, appreciate recommendations. Will stop coumadin and have pt f/u in 2 weeks for duplex to ensure no propagation of thrombus. If it has not progressed will tx conservatively w/ warm compresses/ice/ NSAIDs.   Hyperkalemia-- potassium 6.0 this morning, unable to complete full HD session this morning due to rt hip and rt leg pain. Ordered stat renal function panel to check potassium. Can d/c home if WNL.   ESRD-- on MTWF schedule - renal function pending, can resume normal schedule Monday.  - continue home TUMS and renal MVI  Hx of STEMI--in 07/2013. cath revealed 3 vessel disease but he was not a candidate for emergent CABG. He had a DES to the LAD. He had an occluded RCA that could not be fixed.  - holding home nitro  - continue home asa 81mg  and brilinta.   HTN - continue home lopressor 25mg  BID   HLD - continue home lipitor 80mg   Anxiety - holding home trazadone   DM with peripheral neuropathy-- diabetes is diet controlled. HbA1c 6.3  on 05/2010 - CBGs ACHS - cont home gabapentin 100mg  BID  GERD - cont home nexium  FEN - renal/carb mod diet  DVT ppx-- brilinta  Dispo:  Anticipated d/c home today or tomorrow. Consulted social work in case patient has transportation issues getting to open MRI scanner  for lumbar MRI as outpatient.   The patient does have a current PCP Dyke Maes, MD) and does not need an [facility] hospital follow-up appointment after discharge.  The patient does not have transportation limitations that hinder transportation to clinic appointments. .Services Needed at time of discharge: Y = Yes, Blank = No PT:  home health PT, 24 hr supervision/assistance   OT:   RN:   Equipment:   Other:     LOS: 3 days   Gara Kroner, MD  Pager # [phone] 06/23/2014, 1:12 PM

## 2014-06-23 NOTE — Progress Notes (Addendum)
Subjective:   Still having a lot of back pain, on HD now.   Objective Filed Vitals:   06/23/14 0657 06/23/14 0715 06/23/14 0730 06/23/14 0800  BP: 148/68 123/72 96/68 111/61  Pulse: 68 64 65 63  Temp: 97.5 F (36.4 C)     TempSrc: Oral     Resp: Weight: 120.4 kg (265 lb 6.9 oz)     SpO2: 100%      Physical Exam General: alert and oriented. No acute distress Heart: RRR Lungs: CTA, unlabored  Abdomen: obese. Soft and nontender +BS Extremities: no edema  Dialysis Access: L thigh AVG patent on HD  HD: [facility] 3.5h 450/A1.5 116kg 2/3.5 Bath Heparin 11K L thigh AVG No meds  Assessment/Plan: 1. Acute-on-chronic back and RLE pain - difficulty with pain control affecting HD. Lumbar spine- degenerative changes. Unable to obtain MRI- could not fit in machine.  2. Right LE thrombus- acute superficial thrombus in the rt greater saphenous vein- Dr Darrick Penna saw, does not recommend coumadin. Schedule duplex in 2 weeks 3. ESRD - HD on [facility] @ [facility], HD today then back on schedule monday /sign off d/t pain- K+ 6 pre HD 4. Hx multiple access failures - now with L thigh AVG 5. HTN/Volume - BP 101/51 on Metoprolol 25 mg bid; at EDW 6. Anemia - Hgb 14.3, no meds. 7. 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 8. Nutrition - Alb 3, renal carb-mod diet, vitamin. 9. Hx 3-vessel CAD - inferior STEMI, deemed poor candidate for surgery in 04/2013. 10. dispo- home w PT  Jetty Duhamel, NP [facility] Beeper [phone] 06/23/2014,8:52 AM  LOS: 3 days    Pt seen, examined and agree w A/P as above. Still in pain, required two person assist to get up to commode last night per RN.  Not getting out of bed. Cannot be dc'd in this condition and get to dialysis as outpatient. Have d/w primary team.   Vinson Moselle MD pager [phone]    cell [phone] 06/23/2014, 1:19 PM   Additional Objective Labs: Basic Metabolic  Panel:  Recent Labs Lab 06/19/14 1546 06/20/14 0545 06/20/14 1608 06/21/14 1034 06/22/14 0820 06/23/14 0500  NA 137 137 138 135 133* 130*  K 5.8* 7.0* 4.2 5.9* 5.3* 6.0*  CL 96 94* 96 97 92* 95*  CO2 19 16* GLUCOSE 120* 72 128* 97 89 78  BUN 131* 140* 60* 76* 48* 77*  CREATININE 16.01* 16.03* 9.77* 12.66* 9.73* 11.78*  CALCIUM 6.2* 6.2* 7.8* 7.0* 7.4* 6.6*  PHOS 12.0* 14.4* 7.9*  --   --   --    Liver Function Tests:  Recent Labs Lab 06/19/14 1546 06/20/14 0545 06/20/14 1608  AST 28  --   --   ALT 16  --   --   ALKPHOS 49  --   --   BILITOT 1.0  --   --   PROT 6.9  --   --   ALBUMIN 3.0*  3.0* 2.9* 3.0*   No results for input(s): LIPASE, AMYLASE in the last 168 hours. CBC:  Recent Labs Lab 06/19/14 1307  06/20/14 0545 06/20/14 1608 06/21/14 1034  WBC 17.9*  --  15.2* 11.8* 14.1*  NEUTROABS 13.7*  --   --   --  10.3*  HGB 14.9  < > 13.8 14.3 14.3  HCT 43.8  < > 41.2 42.1 43.5  MCV 92.2  --  92.6 90.0 94.0  PLT 190  --  201 161 163  < > = values in this interval not displayed. Blood Culture    Component Value Date/Time   SDES WOUND ARM LEFT 02/16/2013 0811   SDES WOUND ARM LEFT 02/16/2013 0811   SPECREQUEST ARTERIOVENOUS GORTEX GRAFT SITE PT ON ZINACEF 02/16/2013 0811   SPECREQUEST ARTERIOVENOUS GORTEX GRAFT SITE PT ON ZINACEF 02/16/2013 0811   CULT  02/16/2013 0811    NO ANAEROBES ISOLATED Performed at [facility]   CULT  02/16/2013 0811    NO GROWTH 2 DAYS Performed at [facility]   REPTSTATUS 02/21/2013 FINAL 02/16/2013 0811   REPTSTATUS 02/18/2013 FINAL 02/16/2013 0811    Cardiac Enzymes: No results for input(s): CKTOTAL, CKMB, CKMBINDEX, TROPONINI in the last 168 hours. CBG:  Recent Labs Lab 06/21/14 2057 06/22/14 0744 06/22/14 1204 06/22/14 1752 06/22/14 2113  GLUCAP 123* 87 85 83 160*   Iron Studies: No results for input(s): IRON, TIBC, TRANSFERRIN, FERRITIN in the last 72  hours. @lablastinr3 @ Studies/Results: No results found. Medications:   . aspirin EC  81 mg Oral Daily  . atorvastatin  80 mg Oral q1800  . calcium carbonate  3 tablet Oral TID  . gabapentin  100 mg Oral BID  . heparin subcutaneous  5,000 Units Subcutaneous 3 times per day  . metoprolol tartrate  25 mg Oral BID  . multivitamin  1 tablet Oral QHS  . pantoprazole  80 mg Oral Q1200  . sodium bicarbonate  650 mg Oral BID  . sodium chloride  3 mL Intravenous Q12H  . sodium chloride  3 mL Intravenous Q12H  . ticagrelor  90 mg Oral BID  . warfarin   Does not apply Once

## 2014-06-23 NOTE — Discharge Summary (Signed)
Name: [name] MRN: [id number] DOB: February 12, 1956 59 y.o. PCP: Dyke Maes, MD  Date of Admission: 06/19/2014 11:50 AM Date of Discharge: 06/28/2014 Attending Physician: Randall Hiss, MD  Discharge Diagnosis:   Right leg pain and lower right back pain   End-stage renal disease on hemodialysis   Hyperkalemia   HTN (hypertension)   Acute superficial venous thrombosis of lower extremity   Left thigh wound  Discharge Medications:   Medication List    STOP taking these medications        cephALEXin 500 MG capsule  Commonly known as:  KEFLEX     metoprolol tartrate 25 MG tablet  Commonly known as:  LOPRESSOR      TAKE these medications        aspirin EC 81 MG tablet  Take 1 tablet (81 mg total) by mouth daily.     atorvastatin 80 MG tablet  Commonly known as:  LIPITOR  Take 1 tablet (80 mg total) by mouth daily at 6 PM.     b complex-vitamin c-folic acid 0.8 MG Tabs tablet  Take 0.8 mg by mouth at bedtime.     calcium carbonate 500 MG chewable tablet  Commonly known as:  TUMS - dosed in mg elemental calcium  Chew 3 tablets by mouth 3 (three) times daily.     colchicine 0.6 MG tablet  Take 0.6 mg by mouth 2 (two) times daily as needed. For gout     doxycycline 100 MG tablet  Commonly known as:  VIBRA-TABS  Take 1 tablet (100 mg total) by mouth 2 (two) times daily.     esomeprazole 40 MG capsule  Commonly known as:  NEXIUM  Take 40 mg by mouth every evening.     fentaNYL 50 MCG/HR  Commonly known as:  DURAGESIC - dosed mcg/hr  Place 1 patch (50 mcg total) onto the skin every 3 (three) days.     gabapentin 100 MG capsule  Commonly known as:  NEURONTIN  Take 1 capsule by mouth 2 (two) times daily as needed.     latanoprost 0.005 % ophthalmic solution  Commonly known as:  XALATAN  Place 1 drop into both eyes at bedtime.     loperamide 2 MG capsule  Commonly known as:  IMODIUM  Take 4 mg by mouth as needed for diarrhea or loose stools.     naproxen sodium 220 MG tablet  Commonly known as:  ANAPROX  Take 440 mg by mouth 2 (two) times daily with a meal.     nitroGLYCERIN 0.4 MG SL tablet  Commonly known as:  NITROSTAT  Place 1 tablet (0.4 mg total) under the tongue every 5 (five) minutes as needed for chest pain.     oxyCODONE 5 MG immediate release tablet  Commonly known as:  Oxy IR/ROXICODONE  Take 1-2 tablets (5-10 mg total) by mouth every 4 (four) hours as needed for moderate pain.     prasugrel 10 MG Tabs tablet  Commonly known as:  EFFIENT  Take 1 tablet (10 mg total) by mouth daily.     sodium bicarbonate 650 MG tablet  Take 650 mg by mouth 2 (two) times daily.     sulindac 200 MG tablet  Commonly known as:  CLINORIL  Take 200 mg by mouth 2 (two) times daily.     ticagrelor 90 MG Tabs tablet  Commonly known as:  BRILINTA  Take 90 mg by mouth 2 (two) times daily.  traZODone 50 MG tablet  Commonly known as:  DESYREL  Take 50 mg by mouth at bedtime as needed for sleep.        Disposition and follow-up:   Mr.Toivo R Landstrom was discharged from [facility] in Stable condition.  At the hospital follow up visit please address:  1.  Patient's lopressor was stopped on discharge due to low normal blood pressures overnight, please resume as needed.  2. Pt was discharged home with doxycycline  BID x 10 days on discharge for left thigh wound. Wound culture negative, final read pending. Please monitor wound for signs/symptoms of infection. Dr. Edilia Bo with vascular surgery saw patient on day of discharge, believes wound to be superficial.   3. Pt was noted to have a right superficial venous thrombus near the SFC junction. Dr. Darrick Penna with vascular surgery saw patient and recommended follow up lower extremity dopplers to see if SVT propagated or not. If there is no propagation recommends conservative treatment with NSAIDs, ice, and heat. Dr. Jones Broom with cardiology saw patient and did not  recommend anticoagulation as well as pt has frequent falls. If pt does end up requiring anticoagulation Dr. Jones Broom recommends stopping brilinta and continuing aspirin along with anticoagulation due to increased bleeding risk.   4.  Labs / imaging needed at time of follow-up: BMET and CBC  5.  Pending labs/ test needing follow-up: wound culture taken on 06/27/14 - culture was neg, pending final read.   6. Pain was ultimately controlled with oxycodone IR  q4h prn and fentanyl patch that was last placed on 06/26/14.     Follow-up Appointments: Follow-up Information    Follow up with [facility] In 2 weeks.   Specialty:  Neurosurgery   Why:  for back pain   Contact information:   [address] [place] [address] [phone]       Follow up with Dyke Maes, MD.   Specialty:  Nephrology   Contact information:   [address] [place] [address] [phone]       Follow up with Sherren Kerns, MD In 2 weeks.   Specialty:  Vascular Surgery   Why:  right lower extremity doppler   Contact information:   [address] [place] [address] [phone]       Discharge Instructions:   Please make sure patient has the above hospital follow up appointments made. Thank you.   Consultations: Treatment Team:  Arita Miss, MD [name], MD Chuck Hint, MD  Dr. Gala Romney, MD Dr. Darrick Penna, MD  Procedures Performed:  Dg Lumbar Spine 2-3 Views  06/19/2014   CLINICAL DATA:  Low back pain for 3 days.  No known injury.  EXAM: LUMBAR SPINE - 2-3 VIEW  COMPARISON:  09/19/2013  FINDINGS: Degenerative changes in the lumbar spine at with narrowed lumbar interspaces and associated endplate hypertrophic changes. Prominent degenerative changes at the lumbosacral interspace, similar to prior study. No vertebral compression deformities. No focal bone lesion or bone destruction. Extensive vascular calcifications.  Vascular calcifications and calcification in the vas deferens.  IMPRESSION: Degenerative changes in the lumbar spine. No significant change since prior study.   Electronically Signed   By: Burman Nieves M.D.   On: 06/19/2014 21:51   Mr Lumbar Spine Wo Contrast  06/27/2014   CLINICAL DATA:  59 year old male with end-stage renal disease on hemodialysis. History diabetes, hypertension and chronic back pain.  EXAM: MRI LUMBAR SPINE  WITHOUT CONTRAST  TECHNIQUE: Multiplanar, multisequence MR imaging of the lumbar spine was performed. No intravenous contrast was administered.  COMPARISON:  None.  FINDINGS: The vertebral bodies of the lumbar spine are normal in size. The vertebral bodies of the lumbar spine are normal in alignment. There is diffuse heterogeneous marrow signal throughout the lumbar spine with areas of low signal with the overall appearance compatible with the patient's history of end-stage renal disease and renal osteodystrophy. There is degenerative disc disease with disc height loss at L3-4 and L5-S1. There are Modic endplate changes at L3-4 and L5-S1.  There is The spinal cord is normal in signal and contour. The cord terminates normally at L1 . The nerve roots of the cauda equina and the filum terminale are normal.  The visualized portions of the SI joints are unremarkable.  Multi cystic, atrophic bilateral kidneys.  T12-L1: No significant disc bulge. No evidence of neural foraminal stenosis. No central canal stenosis.  L1-L2: No significant disc bulge. No evidence of neural foraminal stenosis. No central canal stenosis.  L2-L3: Mild bilateral facet arthropathy. Mild spinal stenosis. No evidence of neural foraminal stenosis.  L3-L4: Large right paracentral and foraminal disc protrusion with mass effect on the right intraspinal L4 nerve root and the exiting L3 nerve root. There is cranial migration of disc material along the right posterior paracentral aspect of the L3 vertebral body. There is severe  right foraminal stenosis. There is moderate left foraminal stenosis. There is severe bilateral facet arthropathy with ligamentum flavum infolding resulting in severe spinal stenosis.  L4-L5: Mild broad-based disc bulge. Mild bilateral facet arthropathy. Mild spinal stenosis. Mild bilateral foraminal stenosis.  L5-S1: Mild broad-based disc bulge. Mild bilateral facet arthropathy. Moderate bilateral foraminal stenosis. No central canal stenosis.  IMPRESSION: 1. At L3-4 there is a large right paracentral and foraminal disc protrusion with mass effect on the right intraspinal L4 nerve root and the exiting L3 nerve root. There is cranial migration of disc material along the right posterior paracentral aspect of the L3 vertebral body. There is severe right foraminal stenosis. There is moderate left foraminal stenosis. There is severe bilateral facet arthropathy with ligamentum flavum infolding resulting in severe spinal stenosis. 2. Multilevel lumbar spine spondylosis as detailed above.   Electronically Signed   By: Elige Ko   On: 06/27/2014 10:10   Dg Chest Portable 1 View  06/19/2014   CLINICAL DATA:  Vascular access problem.  Shortness of breath.  EXAM: PORTABLE CHEST - 1 VIEW  COMPARISON:  May 23, 2013.  FINDINGS: The heart size and mediastinal contours are within normal limits. No pneumothorax or plural effusion is noted. Stable mild central pulmonary vascular congestion is noted. No acute pulmonary disease is noted. The visualized skeletal structures are unremarkable.  IMPRESSION: Stable mild central pulmonary vascular congestion. No other significant abnormality seen in the chest.   Electronically Signed   By: Lupita Raider, M.D.   On: 06/19/2014 13:34   Dg Hip Unilat With Pelvis 2-3 Views Right  06/19/2014   CLINICAL DATA:  Right hip pain for 3 days. History of right hip fracture. No new injury.  EXAM: RIGHT HIP (WITH PELVIS) 2-3 VIEWS  COMPARISON:  None.  FINDINGS: Degenerative changes in the  lower lumbar spine and both hips. Postoperative changes with 4 screws fixing the right femoral neck. Surgical hardware appears intact. No residual or recurrent fracture demonstrated in the right hip. Proximal left hip deformity. This may be due to old fracture deformity. Angulation of  the head of precludes evaluation of the femoral neck on the left. Pelvis appears intact. No displaced fractures. SI joints and symphysis pubis are not displaced. Ligamentous calcification along the right iliac crest. Diffuse bone demineralization.  IMPRESSION: No definite acute fracture demonstrated. Postoperative changes in the right hip. Nonspecific deformity of the left hip possibly due to old fracture deformity. Deformity limits evaluation of the left hip.   Electronically Signed   By: Burman Nieves M.D.   On: 06/19/2014 21:50    Admission HPI: Pt is a 59 y/o male w/ PMHx of ESRD, DM, anxiety, chronic back pain, HTN, and HLD who presents with rt lower back pain and rt leg pain. Pt states has had lower back pain and rt leg pain x 3 months that has steadily gotten worse. He only notices rt lower back and rt leg during HD. Pain is constant during HD and resolves when he lays down after HD. He states once he feels pain he notices it in his lower back and rt leg, he denies shooting pain from back down leg. He was unable to complete HD yesterday due to pain and thus HD was cut short. He is on a M, T, W, Th schedule and was suppose to get HD today but decided to come to the ED for dialysis b/c he cannot tolerate HD at his previous center due to lower back and rt leg pain. He has baseline chronic lower back pain that he takes oxycodone for which is prescribed to him by his PCP Dr. Briant Cedar. He is able to ambulate with his walker. He denies lower extremity swelling or weakness. He does have numbness of his 1st toe on his right foot s/p toe nail removal that is chronic.   On admission to the ED, pt's potassium was 6.6 and pt was  given insulin, dextrose, and kayexalate. Pt smokes 1PPD x 30 years. Denies alcohol or illicit drug use.    Hospital Course by problem list:  Right leg pain and lower right back pain   End-stage renal disease on hemodialysis   Hyperkalemia   HTN (hypertension)   Acute superficial venous thrombosis of lower extremity   Left thigh wound   Chronic lower back pain with worsening of rt lower back and rt leg pain--likely secondary to L4 spinal stenosis.Lumbar spine xray on admission revealed degenerative changes and xray of rt hip neg for for acute changes. Pt had difficulty getting MRI of lumbar spine due to inability to lay flat due to pain. Eventually pt was able to get a MRI of lumbar spine with general anesthesia after cardiology saw patient and stated that he was low cardiac risk for anesthesia. MRI of lumbar spine revealed severe L4 spinal stenosis. Neurosurgery was consulted. Per Dr. Wynetta Emery, " L3-4 disc herniation with a free fragment migrating cephalad causing compression of the right L3 and L4 nerve root. Clinically patient has a normal neurologic exam with no focal motor deficits and some very mild sensory deficits. Due to patient's multiple medical comorbidities is end-stage renal disease and eczema high risk for bleeding complications in wound healing. In addition his diabetes and ptotic history also make him a significant risk for wound healing in general anesthesia. I understand cardiology had declared him a low risk for general anesthesia however I think the greater issue is his bleeding and wound healing issues. Patient also may have some systemic infection related to some open ulcers that are currently in the process of being worked up. In the  setting of a normal neurologic exam and in this patient with multiple medical comorbidities I would continue to aggressively pursue nonoperative care. If infection were ruled out and the patient with did not of the contraindications to steroids could  consider epidural steroid injection or alternatively continued pain management with physical therapy and time. I think it's okay with the patient be transferred to a skilled nursing facility or inpatient rehabilitation for ongoing physical therapy was scheduled follow-up in approximately 2-3 weeks as an outpatient." Please ensure patient has follow up with neurosurgery in 2-3 weeks.   Superficial Rt LE thrombus-- LE dopplers positive for acute superficial thrombus in the rt greater saphenous vein beginning at the rt saphanofemoral junction. Pt received dose of coumadin per pharm which was then stopped after vascular surgery saw patient. Dr. Darrick Penna with vascular surgery recommended repeating lower extremity dopplers to ensure no propagation of thrombus. If it has not progressed recommend tx conservatively w/ warm compresses/ice/ NSAIDs. Dr. Gala Romney w/ cardiology saw patient regarding concurrent asa and brilinta use for hx of stent. Recommended discontinuing brilinta if coumadin is continued for SVT.   Proximal medial thigh wound-- near site of previous IV access. 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 Wound gram stain positive for abundant WBCs, predominantly PMNs, no organisms or squamous epi cells. Wound culture negative, final read pending. Dr. Edilia Bo with vascular surgery saw patient day of discharge, probed wound with q-tip. Believe wound is superficial. Recommended local wound care. States that, "the only surgical option would be to replace the venous half of the graft essentially and then excise the old segment. The graft is irregular in multiple areas, thus I would probably have to replace the entire venous half of the graft." Spoke with Dr. Edilia Bo prior to discharge to SNF and states agree with abx tx of doxycycline 100mg  BID x 10 days.   Also, spoke with nephrology PA Bridgette who states pt received 3 doses of vancomycin at outpatient dialysis and was instructed to follow  up with vascular surgery for left thigh wound however he never did. Okay with d/c pt to SNF today with HD arranged for Friday, 06/29/14 and with doxy 100mg  BID x 10days. Pt should have a vascular surgery follow up appointment made with Dr. Darrick Penna for LE dopplers, can have wound checked there as well.    Hyperkalemia--secondary to short HD day before admission and missing HD day of admission. Potassium 6.6 on admission. EKG on presentation had peaked T waves compared to last EKG on 01/26/14. Pt given insulin, dextrose, and kayexalate in the ED. Repeat BMET revealed potassium of 5.8. Consulted nephrology in the ED for HD and spoke with Dr. Marisue Humble w/ Nephrology who took patient to HD the next day. Resolved with hemodialysis.   ESRD-- on MTWF schedule. Pt received dialysis while inpatient. Per discussion with Dr. Arlean Hopping it was agreed that pt would be started on fentanyl patch q72 hours which would be filled outpatient by his office.   Hx of STEMI--in 07/2013. cath revealed 3 vessel disease but he was not a candidate for emergent CABG. He had a DES to the LAD. He had an occluded RCA that could not be fixed. Held home nitro during this admission and continued home asa 81mg  and brilinta.   Discharge Vitals:   BP 121/64 mmHg  Pulse 70  Temp(Src) 98.4 F (36.9 C) (Oral)  Resp 18  Ht 5\' 11"  (1.803 m)  Wt 262 lb 5.6 oz (119 kg)  BMI 36.61 kg/m2  SpO2 97%  Discharge Labs:  Results for orders placed or performed during the hospital encounter of 06/19/14 (from the past 24 hour(s))  Glucose, capillary     Status: Abnormal   Collection Time: 06/27/14  4:55 PM  Result Value Ref Range   Glucose-Capillary 113 (H) 70 - 99 mg/dL  Glucose, capillary     Status: Abnormal   Collection Time: 06/27/14 10:41 PM  Result Value Ref Range   Glucose-Capillary 110 (H) 70 - 99 mg/dL  Protime-INR     Status: None   Collection Time: 06/28/14  7:45 AM  Result Value Ref Range   Prothrombin Time 14.6 11.6 - 15.2  seconds   INR 1.13 0.00 - 1.49  Basic metabolic panel     Status: Abnormal   Collection Time: 06/28/14  7:45 AM  Result Value Ref Range   Sodium 133 (L) 135 - 145 mmol/L   Potassium 4.9 3.5 - 5.1 mmol/L   Chloride 94 (L) 96 - 112 mmol/L   CO2 27 19 - 32 mmol/L   Glucose, Bld 94 70 - 99 mg/dL   BUN 39 (H) 6 - 23 mg/dL   Creatinine, Ser 1.61 (H) 0.50 - 1.35 mg/dL   Calcium 8.5 8.4 - 09.6 mg/dL   GFR calc non Af Amer 8 (L) >90 mL/min   GFR calc Af Amer 9 (L) >90 mL/min   Anion gap 12 5 - 15  Glucose, capillary     Status: None   Collection Time: 06/28/14  8:06 AM  Result Value Ref Range   Glucose-Capillary 94 70 - 99 mg/dL   Comment 1 Notify RN    Comment 2 Documented in Char     Signed: Gara Kroner, MD 06/28/2014, 2:09 PM    Services Ordered on Discharge: SNF Equipment Ordered on Discharge: none

## 2014-06-23 NOTE — Progress Notes (Signed)
Patient has received  2 hours and 17 minutes of his prescribed 4 hour treatment. Patient has signed off due to pain in his right hip and leg. Patient has been given oxycodone 10 mg at 0730.The patient states that the pain medication has not helped his pain. Despite urging from staff, the patient is adamant regarding discontinuing today's treatment. It has been communicated to the patient that his potassium is elevated and continued treatment is necessary. Patient has declined to continue his treatment.

## 2014-06-24 LAB — BASIC METABOLIC PANEL
Anion gap: 13 (ref 5–15)
BUN: 72 mg/dL — ABNORMAL HIGH (ref 6–23)
CHLORIDE: 95 mmol/L — AB (ref 96–112)
CO2: 23 mmol/L (ref 19–32)
CREATININE: 10.46 mg/dL — AB (ref 0.50–1.35)
Calcium: 6.7 mg/dL — ABNORMAL LOW (ref 8.4–10.5)
GFR calc Af Amer: 5 mL/min — ABNORMAL LOW (ref >90)
GFR calc non Af Amer: 5 mL/min — ABNORMAL LOW (ref >90)
Glucose, Bld: 83 mg/dL (ref 70–99)
POTASSIUM: 6.3 mmol/L — AB (ref 3.5–5.1)
Sodium: 131 mmol/L — ABNORMAL LOW (ref 135–145)

## 2014-06-24 LAB — GLUCOSE, CAPILLARY
GLUCOSE-CAPILLARY: 95 mg/dL (ref 70–99)
GLUCOSE-CAPILLARY: 90 mg/dL (ref 70–99)
Glucose-Capillary: 121 mg/dL — ABNORMAL HIGH (ref 70–99)
Glucose-Capillary: 92 mg/dL (ref 70–99)

## 2014-06-24 LAB — CBC
HCT: 41.2 % (ref 39.0–52.0)
Hemoglobin: 13.9 g/dL (ref 13.0–17.0)
MCH: 30.5 pg (ref 26.0–34.0)
MCHC: 33.7 g/dL (ref 30.0–36.0)
MCV: 90.5 fL (ref 78.0–100.0)
Platelets: 207 10*3/uL (ref 150–400)
RBC: 4.55 MIL/uL (ref 4.22–5.81)
RDW: 15.5 % (ref 11.5–15.5)
WBC: 15.5 10*3/uL — ABNORMAL HIGH (ref 4.0–10.5)

## 2014-06-24 LAB — RENAL FUNCTION PANEL
ALBUMIN: 2.9 g/dL — AB (ref 3.5–5.2)
Anion gap: 18 — ABNORMAL HIGH (ref 5–15)
BUN: 88 mg/dL — ABNORMAL HIGH (ref 6–23)
CHLORIDE: 91 mmol/L — AB (ref 96–112)
CO2: 20 mmol/L (ref 19–32)
CREATININE: 11.5 mg/dL — AB (ref 0.50–1.35)
Calcium: 6.4 mg/dL — CL (ref 8.4–10.5)
GFR calc Af Amer: 5 mL/min — ABNORMAL LOW (ref >90)
GFR, EST NON AFRICAN AMERICAN: 4 mL/min — AB (ref >90)
Glucose, Bld: 84 mg/dL (ref 70–99)
POTASSIUM: 6.6 mmol/L — AB (ref 3.5–5.1)
Phosphorus: 9.8 mg/dL — ABNORMAL HIGH (ref 2.3–4.6)
SODIUM: 129 mmol/L — AB (ref 135–145)

## 2014-06-24 LAB — PROTIME-INR
INR: 1.32 (ref 0.00–1.49)
Prothrombin Time: 16.5 seconds — ABNORMAL HIGH (ref 11.6–15.2)

## 2014-06-24 MED ORDER — LIDOCAINE HCL (PF) 1 % IJ SOLN: 5.0000 mL | INTRAMUSCULAR | Status: DC | PRN

## 2014-06-24 MED ORDER — SODIUM CHLORIDE 0.9 % IV SOLN: 100.0000 mL | INTRAVENOUS | Status: DC | PRN

## 2014-06-24 MED ORDER — PENTAFLUOROPROP-TETRAFLUOROETH EX AERO: 1.0000 "application " | INHALATION_SPRAY | CUTANEOUS | Status: DC | PRN

## 2014-06-24 MED ORDER — HEPARIN SODIUM (PORCINE) 1000 UNIT/ML DIALYSIS: 5000.0000 [IU] | INTRAMUSCULAR | Status: DC | PRN

## 2014-06-24 MED ORDER — LIDOCAINE-PRILOCAINE 2.5-2.5 % EX CREA: 1.0000 "application " | TOPICAL_CREAM | CUTANEOUS | Status: DC | PRN

## 2014-06-24 MED ORDER — HEPARIN SODIUM (PORCINE) 1000 UNIT/ML DIALYSIS: 1000.0000 [IU] | INTRAMUSCULAR | Status: DC | PRN

## 2014-06-24 MED ORDER — LIDOCAINE-PRILOCAINE 2.5-2.5 % EX CREA
1.0000 "application " | TOPICAL_CREAM | CUTANEOUS | Status: DC | PRN
  Filled 2014-06-24: qty 5

## 2014-06-24 MED ORDER — NEPRO/CARBSTEADY PO LIQD
237.0000 mL | ORAL | Status: DC | PRN
  Filled 2014-06-24: qty 237

## 2014-06-24 MED ORDER — NEPRO/CARBSTEADY PO LIQD: 237.0000 mL | ORAL | Status: DC | PRN

## 2014-06-24 MED ORDER — ALTEPLASE 2 MG IJ SOLR
2.0000 mg | Freq: Once | INTRAMUSCULAR | Status: DC | PRN
  Filled 2014-06-24: qty 2

## 2014-06-24 MED ORDER — HEPARIN SODIUM (PORCINE) 1000 UNIT/ML DIALYSIS
1000.0000 [IU] | INTRAMUSCULAR | Status: DC | PRN
Start: 2014-06-24 — End: 2014-06-24

## 2014-06-24 MED ORDER — DIPHENHYDRAMINE HCL 25 MG PO CAPS
25.0000 mg | ORAL_CAPSULE | Freq: Once | ORAL | Status: AC
  Administered 2014-06-24: 25 mg via ORAL
  Filled 2014-06-24: qty 1

## 2014-06-24 MED ORDER — ALTEPLASE 2 MG IJ SOLR: 2.0000 mg | Freq: Once | INTRAMUSCULAR | Status: DC | PRN

## 2014-06-24 MED ORDER — HEPARIN SODIUM (PORCINE) 1000 UNIT/ML DIALYSIS: 11000.0000 [IU] | Freq: Once | INTRAMUSCULAR | Status: DC

## 2014-06-24 MED ORDER — HEPARIN SODIUM (PORCINE) 1000 UNIT/ML DIALYSIS
11000.0000 [IU] | Freq: Once | INTRAMUSCULAR | Status: DC
  Filled 2014-06-24: qty 11

## 2014-06-24 MED ORDER — HEPARIN SODIUM (PORCINE) 1000 UNIT/ML IJ SOLN
11000.0000 [IU] | Freq: Once | INTRAMUSCULAR | Status: AC
  Administered 2014-06-24: 11000 [IU] via INTRAVENOUS

## 2014-06-24 MED ORDER — HEPARIN SODIUM (PORCINE) 1000 UNIT/ML DIALYSIS
8000.0000 [IU] | Freq: Once | INTRAMUSCULAR | Status: DC
  Filled 2014-06-24: qty 8

## 2014-06-24 NOTE — Progress Notes (Signed)
CRITICAL VALUE ALERT  Critical value received:  Potassium  Date of notification:  06/24/2014  Time of notification:  0653  Critical value read back:Yes.    Nurse who received alert:  Bernie Covey RN-BC, WTA  MD notified (1st page):  Dr. Leatha Gilding  Time of first page:  [phone]  MD notified (2nd page):  Time of second page:  Responding MD:  Dr. Leatha Gilding  Time MD responded:  Caprice.Barbara  MD aware.

## 2014-06-24 NOTE — Progress Notes (Signed)
Dr. Arlean Hopping called and informed of critical K level of 6.6 and Ca level of 6.4. Pt to be put back on 1K bath for and additional hour during his Tx. Bath changed to 1K 2.5Ca as ordered

## 2014-06-24 NOTE — Progress Notes (Signed)
Subjective: Pt complaining of left hip pain and rt wrist pain extending to rt shoulder. States he was unable to complete HD yesterday to generalized all over pain that was not limited to rt hip and rt leg pain. He is states he would be willing to work with physical therapy today, he was not able to work with them before b/c he states he was in pain and PT had just come after HD.   Pt states duragesic patch has not kicked in yet. States his pain is worse in the hospital than it has been at home. Does not want to take kayxelate due to severe diarrhea.   Objective: Vital signs in last 24 hours: Filed Vitals:   06/23/14 1735 06/23/14 2114 06/24/14 0230 06/24/14 0518  BP: 154/74 115/67  92/48  Pulse: 73 75  66  Temp: 98.4 F (36.9 C) 98.5 F (36.9 C)  98.2 F (36.8 C)  TempSrc: Oral Oral  Oral  Resp: Height:    (1.803 m)   Weight:      SpO2: 99% 98%  95%   Weight change: -1 lb 5.2 oz (-0.6 kg)  Intake/Output Summary (Last 24 hours) at 06/24/14 0925 Last data filed at 06/24/14 0700  Gross per 24 hour  Intake    840 ml  Output      0 ml  Net    840 ml   General: laying on back,able to sit up without difficulty Lungs: CTAB, no wheezing Cardiac: RRR, no murmurs GI: soft, active bowel sounds, non TTP Neuro: CN II-XII grossly intact Ext: neg for pedal edema MSK:  Straight leg test negative b/l, difficulty with external rotation of hips b/l that has been unchanged since admission, tender to palpation of left flank  Lab Results: Basic Metabolic Panel:  Recent Labs Lab 06/20/14 1608  06/23/14 1215 06/24/14 0540  NA 138  < > 134* 131*  K 4.2  < > 4.4 6.3*  CL 96  < > 94* 95*  CO2 24  < > 27 23  GLUCOSE 128*  < > 89 83  BUN 60*  < > 48* 72*  CREATININE 9.77*  < > 8.73* 10.46*  CALCIUM 7.8*  < > 7.4* 6.7*  PHOS 7.9*  --  5.8*  --   < > = values in this interval not displayed. Liver Function Tests:  Recent Labs Lab 06/19/14 1546  06/20/14 1608  06/23/14 1215  AST 28  --   --   --   ALT 16  --   --   --   ALKPHOS 49  --   --   --   BILITOT 1.0  --   --   --   PROT 6.9  --   --   --   ALBUMIN 3.0*  3.0*  < > 3.0* 3.1*  < > = values in this interval not displayed. CBC:  Recent Labs Lab 06/19/14 1307  06/21/14 1034 06/23/14 1215  WBC 17.9*  < > 14.1* 15.8*  NEUTROABS 13.7*  --  10.3*  --   HGB 14.9  < > 14.3 15.2  HCT 43.8  < > 43.5 46.1  MCV 92.2  < > 94.0 92.4  PLT 190  < > 163 163  < > = values in this interval not displayed. CBG:  Recent Labs Lab 06/22/14 1752 06/22/14 2113 06/23/14 1157 06/23/14 1650 06/23/14 2112 06/24/14 0824  GLUCAP 83 160* 88 90 95 121*  Studies/Results: No results found. Medications: I have reviewed the patient's current medications. Scheduled Meds: . aspirin EC  81 mg Oral Daily  . atorvastatin  80 mg Oral q1800  . calcium carbonate  3 tablet Oral TID  . fentaNYL  50 mcg Transdermal Q72H  . gabapentin  100 mg Oral BID  . heparin subcutaneous  5,000 Units Subcutaneous 3 times per day  . metoprolol tartrate  25 mg Oral BID  . multivitamin  1 tablet Oral QHS  . pantoprazole  80 mg Oral Q1200  . sodium bicarbonate  650 mg Oral BID  . sodium chloride  3 mL Intravenous Q12H  . sodium chloride  3 mL Intravenous Q12H  . ticagrelor  90 mg Oral BID  . warfarin   Does not apply Once   Continuous Infusions:  PRN Meds:.oxyCODONE Assessment/Plan: Principal Problem:   Right leg pain Active Problems:   End-stage renal disease on hemodialysis   HTN (hypertension)   Radicular leg pain   Right-sided low back pain with right-sided sciatica   Acute superficial venous thrombosis of lower extremity   Chronic lower back pain with worsening of rt lower back and rt leg pain--   -  oxycodone 10mg  q4h prn and fentanyl patch q72 hours. Pt still having pain but describes it as more generalized pain stating his rt wrist, rt shoulder, left flank, and lower back hurt. States he has pain  all over during HD. Does not think fentanyl patch has helped with pain. Physical exam neg for straight leg test b/l, able to sit up w/o difficulty. - will continue to monitor pain to see if pain improves with fentanyl patch as it was just applied yesterday. - PT to work with patient today as pt has been laying in bed since admission.  - once pain is better controlled can have pt get open MRI scanner outpatient. Patient unable to lay still for 3 attempts to obtain MRI of l- spine since admission. Does not remember last attempt to get MRI thus likely ativan 1mg  and oxycodone 10mg  adequately controlled pain while trying to get imaging done. Does endorse claustrophobia. Has been seen by ortho in the past and was given injections.   Hyperkalemia-- potassium 6.3 this morning, unable to complete full HD session yesterday due to generalized muscle pain. Pt does not want to take kayxelate for hyperkalemia, willing to try HD today.  - nephrology following, HD tonight  Superficial Rt LE thrombus-- initially started coumadin this admission which was then stopped after vascular surgery saw patient. Will order LE dopplers for patient in 2 weeks to ensure no propagation of thrombus. If it has not progressed will tx conservatively w/ warm compresses/ice/ NSAIDs.   ESRD-- on [facility] schedule - HD tonight, may or may not need to have HD on Monday, nephrology following - continue home TUMS and renal MVI  Hx of STEMI--in 07/2013. cath revealed 3 vessel disease but he was not a candidate for emergent CABG. He had a DES to the LAD. He had an occluded RCA that could not be fixed.  - holding home nitro  - continue home asa 81mg  and brilinta.   HTN - continue home lopressor 25mg  BID   HLD - continue home lipitor 80mg   Anxiety - holding home trazadone   DM with peripheral neuropathy-- diabetes is diet controlled. HbA1c 6.3 on 05/2010 - CBGs ACHS - cont home gabapentin 100mg  BID  GERD - cont home nexium  FEN -  renal/carb mod diet  DVT ppx-- brilinta  Dispo:  Anticipated d/c home today or tomorrow. Consulted social work in case patient has transportation issues getting to open MRI scanner for lumbar MRI as outpatient as well as transportation to HD.   The patient does have a current PCP Dyke Maes, MD) and does not need an [facility] hospital follow-up appointment after discharge.  The patient does not have transportation limitations that hinder transportation to clinic appointments. .Services Needed at time of discharge: Y = Yes, Blank = No PT:  home health PT, 24 hr supervision/assistance   OT:   RN:   Equipment:   Other:     LOS: 4 days   Gara Kroner, MD  Pager # [phone] 06/24/2014, 1:12 PM

## 2014-06-24 NOTE — Progress Notes (Signed)
Subjective:   Still complaining of back pain, signed off HD yesterday due to pain all over.   Objective Filed Vitals:   06/23/14 1735 06/23/14 2114 06/24/14 0230 06/24/14 0518  BP: 154/74 115/67  92/48  Pulse: 73 75  66  Temp: 98.4 F (36.9 C) 98.5 F (36.9 C)  98.2 F (36.8 C)  TempSrc: Oral Oral  Oral  Resp: 18 19  17   Height:   5\' 11"  (1.803 m)   Weight:      SpO2: 99% 98%  95%   Physical Exam General: alert and oriented. No acute distress, more drowsy today but somewhat restless in bed Heart: RRR  Lungs: CTA. Unlabored.  Abdomen: obese. nontender +BS Extremities: no edema.  Dialysis Access: L thigh AVG +b/t  HD: [facility] [place] 3.5h 450/A1.5 116kg 2/3.5 Bath Heparin 11K L thigh AVG No meds  Assessment/Plan: 1. Acute-on-chronic back and RLE pain - difficulty with pain control affecting HD. Lumbar spine- degenerative changes. Unable to obtain MRI- could not fit in machine. Duragesic patch started yesterday. Recommend ortho to eval. Still needs MRI 2. Right LE thrombus- acute superficial thrombus in the rt greater saphenous vein- Dr Darrick Penna saw, does not recommend coumadin. schedule duplex in 2 weeks 3. ESRD - HD on [facility] @ [facility], K 6.3 refusing kayexelate d/t diarrhea but agrees to HD tonight. Signs off every HD because of pain out pt as well 4. Hx multiple access failures - now with L thigh AVG 5. HTN/Volume - BP 92/48 on Metoprolol 25 mg bid. Sounds like he had some cramping on HD may need edw increased 6. Anemia - Hgb 15.2, no meds. 7. 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 8. Nutrition - Alb 3.1, renal carb-mod diet, vitamin. 9. Hx 3-vessel CAD - inferior STEMI, deemed poor candidate for surgery in 04/2013. 10. Dispo- DC when pain controlled and PT eval. Will likely need transportation to HD  Jetty Duhamel, NP [facility] [phone] 06/24/2014,9:22 AM  LOS: 4 days   Pt seen, examined and  agree w A/P as above.  Vinson Moselle MD pager [phone]    cell [phone] 06/24/2014, 11:17 AM     Additional Objective Labs: Basic Metabolic Panel:  Recent Labs Lab 06/20/14 0545 06/20/14 1608  06/23/14 0500 06/23/14 1215 06/24/14 0540  NA 137 138  < > 130* 134* 131*  K 7.0* 4.2  < > 6.0* 4.4 6.3*  CL 94* 96  < > 95* 94* 95*  CO2 16* 24  < > 21 27 23   GLUCOSE 72 128*  < > 78 89 83  BUN 140* 60*  < > 77* 48* 72*  CREATININE 16.03* 9.77*  < > 11.78* 8.73* 10.46*  CALCIUM 6.2* 7.8*  < > 6.6* 7.4* 6.7*  PHOS 14.4* 7.9*  --   --  5.8*  --   < > = values in this interval not displayed. Liver Function Tests:  Recent Labs Lab 06/19/14 1546 06/20/14 0545 06/20/14 1608 06/23/14 1215  AST 28  --   --   --   ALT 16  --   --   --   ALKPHOS 49  --   --   --   BILITOT 1.0  --   --   --   PROT 6.9  --   --   --   ALBUMIN 3.0*  3.0* 2.9* 3.0* 3.1*   No results for input(s): LIPASE, AMYLASE in  the last 168 hours. CBC:  Recent Labs Lab 06/19/14 1307  06/20/14 0545 06/20/14 1608 06/21/14 1034 06/23/14 1215  WBC 17.9*  --  15.2* 11.8* 14.1* 15.8*  NEUTROABS 13.7*  --   --   --  10.3*  --   HGB 14.9  < > 13.8 14.3 14.3 15.2  HCT 43.8  < > 41.2 42.1 43.5 46.1  MCV 92.2  --  92.6 90.0 94.0 92.4  PLT 190  --  201 161 163 163  < > = values in this interval not displayed. Blood Culture    Component Value Date/Time   SDES WOUND ARM LEFT 02/16/2013 0811   SDES WOUND ARM LEFT 02/16/2013 0811   SPECREQUEST ARTERIOVENOUS GORTEX GRAFT SITE PT ON ZINACEF 02/16/2013 0811   SPECREQUEST ARTERIOVENOUS GORTEX GRAFT SITE PT ON ZINACEF 02/16/2013 0811   CULT  02/16/2013 0811    NO ANAEROBES ISOLATED Performed at [facility]   CULT  02/16/2013 0811    NO GROWTH 2 DAYS Performed at [facility]   REPTSTATUS 02/21/2013 FINAL 02/16/2013 0811   REPTSTATUS 02/18/2013 FINAL 02/16/2013 0811    Cardiac Enzymes: No results for input(s): CKTOTAL, CKMB, CKMBINDEX,  TROPONINI in the last 168 hours. CBG:  Recent Labs Lab 06/22/14 2113 06/23/14 1157 06/23/14 1650 06/23/14 2112 06/24/14 0824  GLUCAP 160* 88 90 95 121*   Iron Studies: No results for input(s): IRON, TIBC, TRANSFERRIN, FERRITIN in the last 72 hours. @ Studies/Results: No results found. Medications:   . aspirin EC  81 mg Oral Daily  . atorvastatin  80 mg Oral q1800  . calcium carbonate  3 tablet Oral TID  . fentaNYL  50 mcg Transdermal Q72H  . gabapentin  100 mg Oral BID  . heparin subcutaneous  5,000 Units Subcutaneous 3 times per day  . metoprolol tartrate  25 mg Oral BID  . multivitamin  1 tablet Oral QHS  . pantoprazole  80 mg Oral Q1200  . sodium bicarbonate  650 mg Oral BID  . sodium chloride  3 mL Intravenous Q12H  . sodium chloride  3 mL Intravenous Q12H  . ticagrelor  90 mg Oral BID  . warfarin   Does not apply Once

## 2014-06-24 NOTE — Progress Notes (Signed)
CRITICAL VALUE ALERT  Critical value received: 2042  Date of notification02/28/2016  Time of notification:  2042  Critical value read back yes  Nurse who received alert: Cristy Friedlander  MD notified (1st page):  [place]  Time of first page: 2042  MD notified (2nd page):  Time of second page:  Responding MD: Arlean Hopping  Time MD responded:  2044

## 2014-06-24 NOTE — Progress Notes (Signed)
Physical Therapy Treatment Patient Details Name: [name] MRN: [id number] DOB: [birth year] Today's Date: 06/24/2014    History of Present Illness Patient is a 59 year old man with history of end-stage renal disease on hemodialysis, diabetes mellitus, hypertension, chronic back pain, and other problems as outlined in the medical history who reports pain in his right lower back radiating into his right lower extremity which is worse during hemodialysis sessions. The pain is so severe that he missed one recent HD session and cut short another.    PT Comments    Patient able to stand and take some steps today.  Improvement from prior session, though remains limited by pain and fatigue.  Follow Up Recommendations  Home health PT;Supervision/Assistance - 24 hour     Equipment Recommendations  None recommended by PT    Recommendations for Other Services       Precautions / Restrictions Precautions Precautions: Fall Restrictions Weight Bearing Restrictions: No    Mobility  Bed Mobility Overal bed mobility: Needs Assistance Bed Mobility: Rolling;Sidelying to Sit;Sit to Sidelying Rolling: Modified independent (Device/Increase time) Sidelying to sit: Min assist     Sit to sidelying: Min assist General bed mobility comments: Use of bed rail for rolling.  Stopped patient to instruct on technique for sidelying > sitting.  Required increased time and min assist to raise trunk to sitting.  Required assist to bring LE's onto bed to return to sidelying.  Repeated x2.  Once in sitting, patient with good sitting balance.  Transfers Overall transfer level: Needs assistance Equipment used: Rolling walker (2 wheeled) Transfers: Sit to/from [employer] Sit to Stand: Min assist Stand pivot transfers: Min assist       General transfer comment: Verbal cues for hand placement.  Patient performed stand-pivot transfers bed <> BSC x2 with min assist.  On return to bed, patient  returned knee first into bed and rolled onto back.  Encouraged patient to sit on bed first, and move to sidelying.  Practiced x2.    Ambulation/Gait Ambulation/Gait assistance: Min assist Ambulation Distance (Feet): 10 Feet Assistive device: 1 person hand held assist Gait Pattern/deviations: Step-through pattern;Decreased step length - right;Decreased step length - left;Shuffle;Trunk flexed Gait velocity: Decreased Gait velocity interpretation: Below normal speed for age/gender General Gait Details: Verbal cues to stand upright - patient with very flexed trunk.  Patient ambulated to sink, and stood x 4 minutes, performing LE exercises.  Ambulated back to bed with min assist.  Distance limited by pain.   Stairs            Wheelchair Mobility    Modified Rankin (Stroke Patients Only)       Balance                                    Cognition Arousal/Alertness: Awake/alert Behavior During Therapy: Impulsive;Restless Overall Cognitive Status: Within Functional Limits for tasks assessed (decreased safety awareness - ? baseline)                      Exercises Other Exercises Other Exercises: Standing knee flexion - 5 reps each leg Other Exercises: Standing hip extension - 5 reps both LE's Other Exercises: Shallow squats - 5 reps    General Comments        Pertinent Vitals/Pain Pain Assessment: Faces Faces Pain Scale: Hurts even more Pain Location: Low back, hands, and knees Pain Descriptors / Indicators:  Aching (Swollen hands) Pain Intervention(s): Limited activity within patient's tolerance;Monitored during session;Repositioned    Home Living                      Prior Function            PT Goals (current goals can now be found in the care plan section) Progress towards PT goals: Progressing toward goals    Frequency  Min 3X/week    PT Plan Current plan remains appropriate    Co-evaluation             End of  Session Equipment Utilized During Treatment: Gait belt Activity Tolerance: Patient limited by pain;Patient limited by fatigue Patient left: in bed;with call bell/phone within reach;with bed alarm set     Time: 1610-9604 PT Time Calculation (min) (ACUTE ONLY): 35 min  Charges:  $Therapeutic Activity: 23-37 mins                    G Codes:      Vena Austria 2014-07-05, 3:59 PM Durenda Hurt. Renaldo Fiddler, [facility] Acute Rehab Services Pager [phone]

## 2014-06-25 DIAGNOSIS — M5441 Lumbago with sciatica, right side: Secondary | ICD-10-CM

## 2014-06-25 DIAGNOSIS — M5410 Radiculopathy, site unspecified: Secondary | ICD-10-CM

## 2014-06-25 LAB — CBC WITH DIFFERENTIAL/PLATELET
Basophils Absolute: 0 10*3/uL (ref 0.0–0.1)
Basophils Relative: 0 % (ref 0–1)
Eosinophils Absolute: 0.3 10*3/uL (ref 0.0–0.7)
Eosinophils Relative: 2 % (ref 0–5)
HEMATOCRIT: 44.2 % (ref 39.0–52.0)
Hemoglobin: 14.6 g/dL (ref 13.0–17.0)
LYMPHS PCT: 13 % (ref 12–46)
Lymphs Abs: 1.5 10*3/uL (ref 0.7–4.0)
MCH: 30.7 pg (ref 26.0–34.0)
MCHC: 33 g/dL (ref 30.0–36.0)
MCV: 92.9 fL (ref 78.0–100.0)
MONO ABS: 1.5 10*3/uL — AB (ref 0.1–1.0)
MONOS PCT: 13 % — AB (ref 3–12)
NEUTROS ABS: 8.4 10*3/uL — AB (ref 1.7–7.7)
Neutrophils Relative %: 72 % (ref 43–77)
Platelets: 170 10*3/uL (ref 150–400)
RBC: 4.76 MIL/uL (ref 4.22–5.81)
RDW: 15.5 % (ref 11.5–15.5)
WBC: 11.6 10*3/uL — AB (ref 4.0–10.5)

## 2014-06-25 LAB — BASIC METABOLIC PANEL
ANION GAP: 14 (ref 5–15)
BUN: 45 mg/dL — ABNORMAL HIGH (ref 6–23)
CO2: 26 mmol/L (ref 19–32)
Calcium: 8.2 mg/dL — ABNORMAL LOW (ref 8.4–10.5)
Chloride: 95 mmol/L — ABNORMAL LOW (ref 96–112)
Creatinine, Ser: 7.84 mg/dL — ABNORMAL HIGH (ref 0.50–1.35)
GFR calc non Af Amer: 7 mL/min — ABNORMAL LOW (ref >90)
GFR, EST AFRICAN AMERICAN: 8 mL/min — AB (ref >90)
GLUCOSE: 90 mg/dL (ref 70–99)
POTASSIUM: 4.6 mmol/L (ref 3.5–5.1)
SODIUM: 135 mmol/L (ref 135–145)

## 2014-06-25 LAB — GLUCOSE, CAPILLARY
GLUCOSE-CAPILLARY: 114 mg/dL — AB (ref 70–99)
Glucose-Capillary: 117 mg/dL — ABNORMAL HIGH (ref 70–99)
Glucose-Capillary: 83 mg/dL (ref 70–99)
Glucose-Capillary: 92 mg/dL (ref 70–99)

## 2014-06-25 LAB — PROTIME-INR
INR: 1.29 (ref 0.00–1.49)
Prothrombin Time: 16.3 seconds — ABNORMAL HIGH (ref 11.6–15.2)

## 2014-06-25 MED ORDER — LANTHANUM CARBONATE 500 MG PO CHEW
1000.0000 mg | CHEWABLE_TABLET | Freq: Three times a day (TID) | ORAL | Status: DC
  Administered 2014-06-25 – 2014-06-28 (×7): 1000 mg via ORAL
  Filled 2014-06-25 (×12): qty 2

## 2014-06-25 MED ORDER — MORPHINE SULFATE 2 MG/ML IJ SOLN
2.0000 mg | Freq: Once | INTRAMUSCULAR | Status: AC
  Administered 2014-06-25: 2 mg via INTRAVENOUS
  Filled 2014-06-25: qty 1

## 2014-06-25 NOTE — Progress Notes (Signed)
Subjective: Pt complaining of rt back pain. Later when seen working with PT pt complaining of all over pain. Willing to go to SNF or CIR.   Objective: Vital signs in last 24 hours: Filed Vitals:   06/24/14 2313 06/24/14 2330 06/25/14 0447 06/25/14 0932  BP: 139/84 122/104 124/71 121/70  Pulse: 73 80 72 75  Temp: 97.6 F (36.4 C)  99.2 F (37.3 C) 98.5 F (36.9 C)  TempSrc: Oral  Oral Oral  Resp: 18 14 16 16   Height:      Weight: 263 lb 10.7 oz (119.6 kg)     SpO2: 98%  94% 98%   Weight change: 7 lb 15 oz (3.6 kg)  Intake/Output Summary (Last 24 hours) at 06/25/14 1119 Last data filed at 06/25/14 0933  Gross per 24 hour  Intake    580 ml  Output   2001 ml  Net  -1421 ml   General: laying on back,able to sit up without difficulty Lungs: CTAB, no wheezing Cardiac: RRR, no murmurs GI: soft, active bowel sounds, non TTP Neuro: CN II-XII grossly intact Ext: neg for pedal edema MSK:  Straight leg test negative b/l, difficulty with external rotation of hips b/l that has been unchanged since admission, tender to percussion of lower spine  Lab Results: Basic Metabolic Panel:  Recent Labs Lab 06/23/14 1215  06/24/14 2007 06/25/14 0419  NA 134*  < > 129* 135  K 4.4  < > 6.6* 4.6  CL 94*  < > 91* 95*  CO2 27  < > 20 26  GLUCOSE 89  < > 84 90  BUN 48*  < > 88* 45*  CREATININE 8.73*  < > 11.50* 7.84*  CALCIUM 7.4*  < > 6.4* 8.2*  PHOS 5.8*  --  9.8*  --   < > = values in this interval not displayed. Liver Function Tests:  Recent Labs Lab 06/19/14 1546  06/23/14 1215 06/24/14 2007  AST 28  --   --   --   ALT 16  --   --   --   ALKPHOS 49  --   --   --   BILITOT 1.0  --   --   --   PROT 6.9  --   --   --   ALBUMIN 3.0*  3.0*  < > 3.1* 2.9*  < > = values in this interval not displayed. CBC:  Recent Labs Lab 06/21/14 1034  06/24/14 2006 06/25/14 0419  WBC 14.1*  < > 15.5* 11.6*  NEUTROABS 10.3*  --   --  8.4*  HGB 14.3  < > 13.9 14.6  HCT 43.5  < > 41.2  44.2  MCV 94.0  < > 90.5 92.9  PLT 163  < > 207 170  < > = values in this interval not displayed. CBG:  Recent Labs Lab 06/23/14 2112 06/24/14 0824 06/24/14 1150 06/24/14 1635 06/24/14 2359 06/25/14 0822  GLUCAP 95 121* 90 92 83 114*    Studies/Results: No results found. Medications: I have reviewed the patient's current medications. Scheduled Meds: . aspirin EC  81 mg Oral Daily  . atorvastatin  80 mg Oral q1800  . calcium carbonate  3 tablet Oral TID  . fentaNYL  50 mcg Transdermal Q72H  . gabapentin  100 mg Oral BID  . heparin subcutaneous  5,000 Units Subcutaneous 3 times per day  . lanthanum  1,000 mg Oral TID WC  . metoprolol tartrate  25 mg Oral  BID  . multivitamin  1 tablet Oral QHS  . pantoprazole  80 mg Oral Q1200  . sodium chloride  3 mL Intravenous Q12H  . sodium chloride  3 mL Intravenous Q12H  . ticagrelor  90 mg Oral BID   Continuous Infusions:  PRN Meds:.oxyCODONE Assessment/Plan: Principal Problem:   Right leg pain Active Problems:   End-stage renal disease on hemodialysis   HTN (hypertension)   Radicular leg pain   Right-sided low back pain with right-sided sciatica   Acute superficial venous thrombosis of lower extremity   Chronic lower back pain with worsening of rt lower back and rt leg pain--   -  oxycodone  q4h prn and fentanyl patch q72 hours.  - PT recommends CIR - scheduled to have MRI lumbar spine with general anesthesia Wednesday, will touch base with Dr. Gala Romney regarding pt's cardiac risk regarding general anesthesia.  Hyperkalemia-- potassium 4.6 this morning after HD last night - nephrology following, HD tomorrow  Superficial Rt LE thrombus-- initially started coumadin this admission which was then stopped after vascular surgery saw patient. Will order LE dopplers for patient in 2 weeks to ensure no propagation of thrombus. If it has not progressed will tx conservatively w/ warm compresses/ice/ NSAIDs.   ESRD-- on  MTWF schedule - HD schedule for tomorrow  - continue home TUMS and renal MVI - nephrology added forenol  Hx of STEMI--in 07/2013. cath revealed 3 vessel disease but he was not a candidate for emergent CABG. He had a DES to the LAD. He had an occluded RCA that could not be fixed.  - holding home nitro  - continue home asa  and brilinta.   HTN - continue home lopressor  BID   HLD - continue home lipitor   Anxiety - holding home trazadone   DM with peripheral neuropathy-- diabetes is diet controlled. HbA1c 6.3 on 05/2010 - CBGs ACHS - cont home gabapentin  BID  GERD - cont home nexium  FEN - renal/carb mod diet  DVT ppx-- brilinta  Dispo:  Anticipated discharge date deferred at this time until medical conditions improved and pt has appropriate dispo placement. CIR consulted.   The patient does have a current PCP Dyke Maes, MD) and does not need an [facility] hospital follow-up appointment after discharge.  The patient does not have transportation limitations that hinder transportation to clinic appointments. .Services Needed at time of discharge: Y = Yes, Blank = No PT:  CIR, 24 hr supervision/assistance   OT:   RN:   Equipment:   Other:     LOS: 5 days   Gara Kroner, MD  Pager # [phone] 06/25/2014, 1:12 PM

## 2014-06-25 NOTE — Clinical Social Work Note (Signed)
CSW worked with patient throughout the day regarding SNF placement. Patient was given bed offers and miles/minutes of facilities from his home in [facility] [place] [facility]. This afternoon CSW had conversation with patient regarding not being able to smoke at any of the skilled facilities. Patient became very apprehensive as he is a heavy smoker and battling not being able to smoke now. Patient very honest in expressing his feelings regarding wanting to smoke now and not being able to go 20 days or so without a cigarette.  CSW provided understanding and support. [name] wants to go home but really knows that he cannot take care of himself, nor can his 59 year old mother care for him, as patient and CSW discussed these concerns this morning.  [name], MSW, LCSW Licensed Clinical Social Worker Clinical Social Work Department [employer] [phone]

## 2014-06-25 NOTE — Clinical Social Work Psychosocial (Signed)
Clinical Social Work Department BRIEF PSYCHOSOCIAL ASSESSMENT 06/25/2014  Patient:  [name], [name]     Account Number:  [id number]     Admit date:  06/19/2014  Clinical Social Worker:  Delmer Islam  Date/Time:  06/25/2014 04:14 AM  Referred by:  Physician  Date Referred:  06/25/2014 Referred for  SNF Placement   Other Referral:   Interview type:  Patient Other interview type:    PSYCHOSOCIAL DATA Living Status:  ALONE Admitted from facility:   Level of care:   Primary support name:  Rosalie Gums Primary support relationship to patient:  PARENT Degree of support available:   Strong support    CURRENT CONCERNS Current Concerns  Post-Acute Placement   Other Concerns:    SOCIAL WORK ASSESSMENT / PLAN CSW received consult for skilled facility placement for short-term rehab.  Visited with patient and he is alert, oriented and willing to talk with CSW. Patient very proactive and knows he needs rehab before going home as he lives alone. Patient's family lives in [place] and he plans to stay with his mother for awhile after leaving rehab.   Assessment/plan status:  Psychosocial Support/Ongoing Assessment of Needs Other assessment/ plan:   Information/referral to community resources:   Skilled facility list for [facility].    PATIENT'S/FAMILY'S RESPONSE TO PLAN OF CARE: Patient very open and receptive to talking with CSW about skilled facility placement. CSW explained process and answered patient's questions.      Genelle Bal, MSW, LCSW Licensed Clinical Social Worker Clinical Social Work Department [employer] [phone]

## 2014-06-25 NOTE — Care Management Note (Signed)
CARE MANAGEMENT NOTE 06/25/2014  Patient:  [name], [name]   Account Number:  [id number]  Date Initiated:  06/22/2014  Documentation initiated by:  AMERSON,JULIE  Subjective/Objective Assessment:   Pt adm on 06/19/14 with back pain, RT LE thrombus.  PTA, pt resides at home with family.     Action/Plan:   PT recommending HHPT at dc.  Will follow for dc needs  06/25/14 Pt requesting short term SNF, CSW notified.   Anticipated DC Date:  06/26/2014   Anticipated DC Plan:  [place]  CM consult      [facility] Choice  HOME HEALTH   Choice offered to / List presented to:  C-1 Patient           [place].   Status of service:  Completed, signed off Medicare Important Message given?  YES (If response is "NO", the following Medicare IM given date fields will be blank) Date Medicare IM given:  06/22/2014 Medicare IM given by:  AMERSON,JULIE Date Additional Medicare IM given:  06/25/2014 Additional Medicare IM given by:  [facility]  Discharge Disposition:  [place]  Per UR Regulation:  Reviewed for med. necessity/level of care/duration of stay  If discussed at [place] of Stay Meetings, dates discussed:    Comments:   2/29/2015 Met with pt re d/c needs and pt has now decided that he needs short term rehab. CSW notified and working with pt re placement for short term rehab.  CRoyal RN MPH, case manager, [phone]   06/24/14 12:45 Cm met with pt to offer choice of home health agency. Pt chooses [facility] to render HHPT.  Pt will be recuperating at his mother's home address: [address], [place], [place] [address].  Pt's mobile number has changed to [phone].  referral called to [facility] rep, Lecretia with change of address and change of contact number.  No other CM needs were communicated. Mariane Masters, BSN, Cm [phone].

## 2014-06-25 NOTE — Progress Notes (Signed)
Internal Medicine Attending  Date: 06/25/2014  Patient name: [name] Medical record number: [id number] Date of birth: [birth year] Age: 59 y.o. Gender: male  I saw and evaluated the patient, and discussed his care with resident on A.M rounds.  I reviewed the resident's note by Dr. Danella Penton and I agree with the resident's findings and plans as documented in her note.  Dr. Daiva Eves will take over as attending physician tomorrow 06/26/2014.

## 2014-06-25 NOTE — Progress Notes (Signed)
Physical Therapy Treatment Patient Details Name: [name] MRN: [id number] DOB: November 12, 1955 Today's Date: 06/25/2014    History of Present Illness Patient is a 59 year old man with history of end-stage renal disease on hemodialysis, diabetes mellitus, hypertension, chronic back pain, and other problems as outlined in the medical history who reports pain in his right lower back radiating into his right lower extremity which is worse during hemodialysis sessions. The pain is so severe that he missed one recent HD session and cut short another.    PT Comments    Pt progressing slowly towards physical therapy goals. Was able to tolerate ambulation this session however was not able to attempt any therapeutic exercise due to pain. Focus of session was to improve standing posture with pt at sink with BUE's supported on the counter. Pt with increased pain in his fingers/finger tips this session and supporting himself on the walker/at the sink appeared more difficult than previous sessions. Pt reports concern with returning home as he will not have 24 hour assist. Feel that this pt may be a good candidate for CIR depending on activity tolerance. Discussed with pt the expectation of 3 hours/day of therapy at CIR - pt to attempt sitting up in recliner for first time today. In previous sessions has not been able to tolerate.   Follow Up Recommendations  CIR;Supervision/Assistance - 24 hour     Equipment Recommendations  None recommended by PT    Recommendations for Other Services Rehab consult     Precautions / Restrictions Precautions Precautions: Fall Precaution Comments: Pt needs education on back precautions to minimize pain during transfers Restrictions Weight Bearing Restrictions: No    Mobility  Bed Mobility Overal bed mobility: Needs Assistance Bed Mobility: Rolling;Sidelying to Sit Rolling: Modified independent (Device/Increase time) Sidelying to sit: Mod assist       General  bed mobility comments: Use of bed rail for rolling. Pt with pain in hands/finger tips and had difficulty pushing himself up to full sit. Mod assist provided to achieve full sitting EOB.   Transfers Overall transfer level: Needs assistance Equipment used: Rolling walker (2 wheeled);1 person hand held assist Transfers: Sit to/from [employer] Sit to Stand: Min assist Stand pivot transfers: Min assist       General transfer comment: Pt was able to power-up to full standing with min assist for balance and stability. Pt with flexed posture but unable to improve posture with cueing due to pain. Pt used RW initially, and at end of session transferred bed to chair without AD. Min assist required for safety and balance as pt very flexed.   Ambulation/Gait Ambulation/Gait assistance: Min assist Ambulation Distance (Feet): 12 Feet Assistive device: Rolling walker (2 wheeled) Gait Pattern/deviations: Step-through pattern;Decreased stride length;Trunk flexed Gait velocity: Decreased Gait velocity interpretation: Below normal speed for age/gender General Gait Details: Pt was able to ambulate ~6 feet at a time with RW for support. Very flexed posture with elbow lean on walker for support. Pt unable to improve posture with cueing due to pain. Stood at sink x5 minutes with attempts to improve trunk extension, however pt required return to sitting due to fatigue and pain.    Stairs            Wheelchair Mobility    Modified Rankin (Stroke Patients Only)       Balance Overall balance assessment: Needs assistance Sitting-balance support: Feet supported;Bilateral upper extremity supported Sitting balance-Leahy Scale: Poor Sitting balance - Comments: Pt requires UE support to  maintain seated balance at this time. Overall improved from last session however.    Standing balance support: Bilateral upper extremity supported;During functional activity Standing balance-Leahy Scale:  Poor Standing balance comment: Pt requires UE support at this time for all standing activity.                     Cognition Arousal/Alertness: Awake/alert Behavior During Therapy: Impulsive;Restless (with pain) Overall Cognitive Status: Within Functional Limits for tasks assessed (decreased safety awareness - ? baseline)                      Exercises      General Comments        Pertinent Vitals/Pain Pain Assessment: 0-10 Pain Score: 7  Pain Location: Low back, hands Pain Intervention(s): Limited activity within patient's tolerance;Monitored during session;Repositioned    Home Living                      Prior Function            PT Goals (current goals can now be found in the care plan section) Acute Rehab PT Goals Patient Stated Goal: Decrease his pain PT Goal Formulation: With patient Time For Goal Achievement: 07/05/14 Potential to Achieve Goals: Good Progress towards PT goals: Progressing toward goals    Frequency  Min 3X/week    PT Plan Discharge plan needs to be updated    Co-evaluation             End of Session Equipment Utilized During Treatment: Gait belt Activity Tolerance: Patient limited by pain;Patient limited by fatigue Patient left: in bed;with call bell/phone within reach;with bed alarm set     Time: 8675-4492 PT Time Calculation (min) (ACUTE ONLY): 22 min  Charges:  $Therapeutic Activity: 8-22 mins                    G Codes:      Conni Slipper 07/24/14, 9:52 AM   Conni Slipper, PT, DPT Acute Rehabilitation Services Pager: [phone]

## 2014-06-25 NOTE — Progress Notes (Signed)
Subjective:  Back pain, tolerated dialysis slightly better last night, but unable to walk without assistance.He is concerned about how he will go home, wants to get up and rehab  Objective: Vital signs in last 24 hours: Temp:  [97.6 F (36.4 C)-99.2 F (37.3 C)] 99.2 F (37.3 C) (02/29 0447) Pulse Rate:  [66-80] 72 (02/29 0447) Resp:  [13-23] 16 (02/29 0447) BP: (98-182)/(51-104) 124/71 mmHg (02/29 0447) SpO2:  [94 %-98 %] 94 % (02/29 0447) Weight:  [119.6 kg (263 lb 10.7 oz)-122 kg (268 lb 15.4 oz)] 119.6 kg (263 lb 10.7 oz) (02/28 2313) Weight change: 3.6 kg (7 lb 15 oz)  Intake/Output from previous day: 02/28 0701 - 02/29 0700 In: 360 [P.O.:360] Out: 2001  Intake/Output this shift:   Lab Results:  Recent Labs  06/24/14 2006 06/25/14 0419  WBC 15.5* 11.6*  HGB 13.9 14.6  HCT 41.2 44.2  PLT 207 170   BMET:  Recent Labs  06/23/14 1215  06/24/14 2007 06/25/14 0419  NA 134*  < > 129* 135  K 4.4  < > 6.6* 4.6  CL 94*  < > 91* 95*  CO2 27  < > 20 26  GLUCOSE 89  < > 84 90  BUN 48*  < > 88* 45*  CREATININE 8.73*  < > 11.50* 7.84*  CALCIUM 7.4*  < > 6.4* 8.2*  ALBUMIN 3.1*  --  2.9*  --   < > = values in this interval not displayed. No results for input(s): PTH in the last 72 hours. Iron Studies: No results for input(s): IRON, TIBC, TRANSFERRIN, FERRITIN in the last 72 hours.  Studies/Results: No results found.   EXAM: General appearance: Alert, in no apparent distress morbid obesity Resp: CTA without rales, rhonchi, or wheezes decreased bx Cardio: RRR without murmur or rub Gr 2/6 M GI: + BS, soft and nontender obese,pos bs, liver down 5 cm Extremities: No edema Access: L thigh AVG with + bruit  HD: MTuWF [place] 3.5h 450/A1.5 116kg 2/3.5 Bath Heparin 11K L thigh AVG No meds  Assessment/Plan: 1. Acute-on-chronic back and RLE pain - difficulty with pain control affecting HD; unable to get MRI sec to pt size, receiving PT, currently on  Fentanyl patch & Oxycodone 10 mg q4h. Needs PT&OT 2. R LE thrombus - acute superficial thrombus @ saphenofemoral junction; seen by VVS, treat conservatively, no Coumadin, follow-up Duplex in 2 wks. 3. ESRD - HD on MTuWF @ [facility], K 4.6 s/p HD 2/27 & 28.  Next HD tomorrow. 4. Hx multiple access failures - now with L thigh AVG. 5. HTN/Volume - BP 124/71 on Metoprolol 25 mg bid; wt 119.6 kg s/p net UF 2 L yesterday. 6. Anemia - Hgb 14.6, no meds. 7. 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 Add Fosrenol 8. Nutrition - Alb 2.9, renal carb-mod diet, vitamin. 9. Hx 3-vessel CAD - inferior STEMI, deemed poor candidate for surgery in 04/2013. 10. obesity   LOS: 5 days   LYLES,CHARLES 06/25/2014,7:31 AM

## 2014-06-25 NOTE — Progress Notes (Signed)
Rehab Admissions Coordinator Note:  Patient was screened by Trish Mage for appropriateness for an Inpatient Acute Rehab Consult.  At this time, we are recommending Inpatient Rehab consult.  Lelon Frohlich M 06/25/2014, 10:05 AM  I can be reached at [phone].

## 2014-06-25 NOTE — Consult Note (Signed)
Physical Medicine and Rehabilitation Consult Reason for Consult:*Intractable back pain/end-stage renal disease/multi-medical Referring Phsyician: Internal medicine    HPI: [name] is an 59 y.o. right handed male. With history of end-stage renal disease with hemodialysis, diabetes mellitus with peripheral neuropathy, hypertension, diastolic congestive heart failure, peripheral vascular disease with multiple revascularization procedures and chronic back pain and uses Neurontin as well as oxycodone. Patient recently living alone used a walker and would drive himself to dialysis. Admitted to 23 2016 with low back pain radiating to the right lower extremity. The pain was so severe that he missed one recent hemodialysis session. Lumbar spine films show degenerative changes of lumbar spine no significant change from prior studies. Fentanyl patch added for pain control. Attempts made at MRI 3 unsuccessful as patient was unable to lie still as well as being claustrophobic. Venous Doppler studies of lower extremities 06/19/2014 showed acute superficial thrombus right greater saphenous vein beginning at the right saphenofemoral junction. No Coumadin initiated as it was felt not worth the risk after fall per vascular surgery and recommended follow-up duplex in 2 weeks to make sure no propagation and remained on subcutaneous heparin 5000 units 3 times a day. Physical therapy evaluation completed and ongoing with recommendations of physical medicine rehabilitation consult.  Review of Systems  Cardiovascular: Positive for palpitations and leg swelling.  Gastrointestinal:       GERD  Musculoskeletal: Positive for myalgias and back pain.  Neurological: Positive for headaches.  Psychiatric/Behavioral:       Anxiety  All other systems reviewed and are negative.  Past Medical History  Diagnosis Date  . Anxiety   . Back pain   . GERD (gastroesophageal reflux disease)   . Peripheral neuropathy   . Arthritis    . CHF (congestive heart failure)   . Active smoker   . ESRD on hemodialysis     [name] 4 days per week on M-Tu-Wed and Fri.  Started HD in 1998 and has been on HD initially at [facility], then went to [place] [facility], then in [facility], then to [facility] and now is at [facility] for last 10 years.  Has L thigh AVG.     . Diabetes mellitus     diet controlled  . 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 PONV (postoperative nausea and vomiting)   . Hypertension   . Bell palsy   . Heart murmur   . Carpal tunnel syndrome     bilateral  . Dysrhythmia     Hx: of palpitataions  . Headache(784.0)     Hx: of Migraines   Past Surgical History  Procedure Laterality Date  . Total hip arthroplasty    . Thyroidectomy    . Tooth extraction    . Mandible fracture surgery    . Dg av dialysis graft declot or    . Insertion of dialysis catheter  03/28/2011    Procedure: INSERTION OF DIALYSIS CATHETER;  Surgeon: Pryor Ochoa, MD;  Location: [facility] OR;  Service: Vascular;  Laterality: Left;  . Av fistula placement  03/31/2011    Procedure: INSERTION OF ARTERIOVENOUS (AV) GORE-TEX GRAFT THIGH;  Surgeon: Sherren Kerns, MD;  Location: [facility] OR;  Service: Vascular;  Laterality: Right;  redo right thigh arteriovenous gortex graft using gore-tex stretch 72mm x 70cm  . Thrombectomy w/ embolectomy  06/02/2011    Procedure: THROMBECTOMY ARTERIOVENOUS GORE-TEX GRAFT;  Surgeon: Chuck Hint, MD;  Location: [facility] OR;  Service: Vascular;  Laterality: Right;  Thrombectomy right thigh arteriovenous gortex graft;  revision by  replacement of large portion of graft with 7mm gore-tex   . Insertion of dialysis catheter  08/28/2011    Procedure: INSERTION OF DIALYSIS CATHETER;  Surgeon: Fransisco Hertz, MD;  Location: [facility] OR;  Service: Vascular;  Laterality: Left;  Atempted Bilateral Internal Jugular, Bilateral Subclavin insertion of 55cm Dialysis Catheter Left Femoral  . Insertion of dialysis catheter  12/15/2011     Procedure: INSERTION OF DIALYSIS CATHETER;  Surgeon: Sherren Kerns, MD;  Location: [facility] OR;  Service: Vascular;  Laterality: Right;  Insertion of Right Femoral Dialysis Catheter  . Exchange of a dialysis catheter  02/26/2012    Procedure: EXCHANGE OF A DIALYSIS CATHETER;  Surgeon: Larina Earthly, MD;  Location: [facility] OR;  Service: Vascular;  Laterality: Right;  . Joint replacement    . Exchange of a dialysis catheter  05/18/2012    Procedure: EXCHANGE OF A DIALYSIS CATHETER;  Surgeon: Larina Earthly, MD;  Location: [facility] OR;  Service: Vascular;  Laterality: Right;  right femoral dialysis catheter  . Av fistula placement  05/18/2012    Procedure: INSERTION OF ARTERIOVENOUS (AV) GORE-TEX GRAFT THIGH;  Surgeon: Larina Earthly, MD;  Location: [facility] OR;  Service: Vascular;  Laterality: Left;  using 6mm by 70cm goretex graft  . Thrombectomy w/ embolectomy Left 08/25/2012    Procedure: THROMBECTOMY ARTERIOVENOUS GORE-TEX GRAFT;  Surgeon: Pryor Ochoa, MD;  Location: [facility] OR;  Service: Vascular;  Laterality: Left;  Attempted thrombectomy of left thigh arteriovenous gortex graft.   . Av fistula placement Left 08/25/2012    Procedure: INSERTION OF ARTERIOVENOUS (AV) GORE-TEX GRAFT THIGH;  Surgeon: Pryor Ochoa, MD;  Location: [facility] OR;  Service: Vascular;  Laterality: Left;  Using 6mm x 40cm vascular Gortex graft.  . Revision of arteriovenous goretex graft Left 12/14/2012    Procedure: Revision of Left Thigh Graft;  Surgeon: Larina Earthly, MD;  Location: [facility] OR;  Service: Vascular;  Laterality: Left;  . Avgg removal Left 02/16/2013    Procedure: EXCISION OF LEFT ARM ARTERIOVENOUS GORETEX GRAFT TIMES 2 WITH VEIN PATCH ANGIOPLASTY OF BRACIAL ARTERY.  ;  Surgeon: Chuck Hint, MD;  Location: [facility] OR;  Service: Vascular;  Laterality: Left;  Converted from MAC to General.    . Revision of arteriovenous goretex graft Left 08/08/2013    Procedure: REVISION OF LEFT THIGH ARTERIOVENOUS GORETEX GRAFT;  Surgeon: Sherren Kerns, MD;   Location: [facility] OR;  Service: Vascular;  Laterality: Left;  . Venogram Bilateral 10/27/2011    Procedure: VENOGRAM;  Surgeon: Nada Libman, MD;  Location: [facility] CATH LAB;  Service: Cardiovascular;  Laterality: Bilateral;  bilat upper extrem venograms  . Left heart cath Bilateral 05/21/2013    Procedure: LEFT HEART CATH;  Surgeon: Iran Ouch, MD;  Location: [facility] CATH LAB;  Service: Cardiovascular;  Laterality: Bilateral;  . Percutaneous coronary stent intervention (pci-s)  05/21/2013    Procedure: PERCUTANEOUS CORONARY STENT INTERVENTION (PCI-S);  Surgeon: Iran Ouch, MD;  Location: [facility] CATH LAB;  Service: Cardiovascular;;  . Coronary angioplasty with stent placement     Family History  Problem Relation Age of Onset  . Diabetes Mother   . Stroke Mother   . Heart disease Mother   . Kidney disease Father   . Hyperlipidemia Father    Social History:  reports that he has been smoking Cigarettes.  He has a 40  pack-year smoking history. He has never used smokeless tobacco. He reports that he does not drink alcohol or use illicit drugs.  No Known Allergies  Prior to Admission medications   Medication Sig Start Date End Date Taking? Authorizing Provider  aspirin 81 MG tablet Take 1 tablet (81 mg total) by mouth daily. 05/24/13  Yes Rhonda G Barrett, PA-C  atorvastatin (LIPITOR) 80 MG tablet Take 1 tablet (80 mg total) by mouth daily at 6 PM. 05/24/13  Yes Rhonda G Barrett, PA-C  b complex-vitamin c-folic acid (NEPHRO-VITE) 0.8 MG TABS Take 0.8 mg by mouth at bedtime.    Yes Historical Provider, MD  calcium carbonate (TUMS - DOSED IN MG ELEMENTAL CALCIUM) 500 MG chewable tablet Chew 3 tablets by mouth 3 (three) times daily.   Yes Historical Provider, MD  colchicine 0.6 MG tablet Take 0.6 mg by mouth 2 (two) times daily as needed. For gout   Yes Historical Provider, MD  esomeprazole (NEXIUM) 40 MG capsule Take 40 mg by mouth every evening.    Yes Historical Provider, MD  gabapentin (NEURONTIN) 100  MG capsule Take 1 capsule by mouth 2 (two) times daily as needed. 12/29/13  Yes Historical Provider, MD  latanoprost (XALATAN) 0.005 % ophthalmic solution Place 1 drop into both eyes at bedtime.  04/20/14  Yes Historical Provider, MD  loperamide (IMODIUM) 2 MG capsule Take 4 mg by mouth as needed for diarrhea or loose stools.   Yes Historical Provider, MD  metoprolol tartrate (LOPRESSOR) 25 MG tablet Take 1 tablet (25 mg total) by mouth 2 (two) times daily. Patient taking differently: Take 25 mg by mouth daily.  05/24/13  Yes Rhonda G Barrett, PA-C  naproxen sodium (ANAPROX) 220 MG tablet Take 440 mg by mouth 2 (two) times daily with a meal.   Yes Historical Provider, MD  oxyCODONE (OXY IR/ROXICODONE) 5 MG immediate release tablet Take 1 tablet (5 mg total) by mouth every 6 (six) hours as needed for moderate pain. 08/08/13  Yes Samantha J Rhyne, PA-C  ticagrelor (BRILINTA) 90 MG TABS tablet Take 90 mg by mouth 2 (two) times daily.   Yes Historical Provider, MD  traZODone (DESYREL) 50 MG tablet Take 50 mg by mouth at bedtime as needed for sleep.  03/06/13  Yes Historical Provider, MD  cephALEXin (KEFLEX) 500 MG capsule Take 500 mg by mouth every 8 (eight) hours.    Historical Provider, MD  nitroGLYCERIN (NITROSTAT) 0.4 MG SL tablet Place 1 tablet (0.4 mg total) under the tongue every 5 (five) minutes as needed for chest pain. 05/24/13   Rhonda G Barrett, PA-C  prasugrel (EFFIENT) 10 MG TABS tablet Take 1 tablet (10 mg total) by mouth daily. Patient not taking: Reported on 06/19/2014 06/16/13   Chrystie Nose, MD  sodium bicarbonate 650 MG tablet Take 650 mg by mouth 2 (two) times daily.      Historical Provider, MD  sulindac (CLINORIL) 200 MG tablet Take 200 mg by mouth 2 (two) times daily.    Historical Provider, MD   Scheduled Medications: . aspirin EC  81 mg Oral Daily  . atorvastatin  80 mg Oral q1800  . calcium carbonate  3 tablet Oral TID  . fentaNYL  50 mcg Transdermal Q72H  . gabapentin  100  mg Oral BID  . heparin subcutaneous  5,000 Units Subcutaneous 3 times per day  . lanthanum  1,000 mg Oral TID WC  . metoprolol tartrate  25 mg Oral BID  . multivitamin  1  tablet Oral QHS  . pantoprazole  80 mg Oral Q1200  . sodium chloride  3 mL Intravenous Q12H  . sodium chloride  3 mL Intravenous Q12H  . ticagrelor  90 mg Oral BID   PRN MED's: oxyCODONE Home: Home Living Family/patient expects to be discharged to:: Private residence Living Arrangements: Parent Available Help at Discharge: Family, Available 24 hours/day Type of Home: House Home Equipment: Walker - 2 wheels Additional Comments: Full history not obtained. Pt very distracted with pain.   Functional History: Prior Function Comments: Pt reports he used the RW all the time and used a sock aid to assist with dressing at home.  Functional Status:  Mobility:     Ambulation/Gait Ambulation Distance (Feet): 12 Feet Gait velocity: Decreased General Gait Details: Pt was able to ambulate ~6 feet at a time with RW for support. Very flexed posture with elbow lean on walker for support. Pt unable to improve posture with cueing due to pain. Stood at sink x5 minutes with attempts to improve trunk extension, however pt required return to sitting due to fatigue and pain.     ADL:    Cognition: Cognition Overall Cognitive Status: Within Functional Limits for tasks assessed (decreased safety awareness - ? baseline) Orientation Level: Oriented X4 Cognition Arousal/Alertness: Awake/alert Behavior During Therapy: Impulsive, Restless (with pain) Overall Cognitive Status: Within Functional Limits for tasks assessed (decreased safety awareness - ? baseline)  Blood pressure 121/70, pulse 75, temperature 98.5 F (36.9 C), temperature source Oral, resp. rate 16, height 5\' 11"  (1.803 m), weight 119.6 kg (263 lb 10.7 oz), SpO2 98 %. Physical Exam  Vitals reviewed. Constitutional: He is oriented to person, place, and time.   59 year old right-handed obese African-American male sitting up in chair  HENT:  Head: Normocephalic.  Eyes: EOM are normal.  Neck: Normal range of motion. Neck supple. No thyromegaly present.  Cardiovascular: Normal rate and regular rhythm.   Respiratory: Breath sounds normal. No respiratory distress.  GI: Soft. Bowel sounds are normal. He exhibits no distension.  Musculoskeletal:  SLR equivocal, SST negative. Little pain while seated EOB.  Low back generally tender. Worse with flexion  Neurological: He is alert and oriented to person, place, and time.  Mood is flat but appropriate. Follows all commands. Moving all extremities. Strength 5/5 in both legs. ?sensory deficits distally.   Skin: Skin is warm and dry.    Results for orders placed or performed during the hospital encounter of 06/19/14 (from the past 24 hour(s))  Glucose, capillary     Status: None   Collection Time: 06/24/14 11:50 AM  Result Value Ref Range   Glucose-Capillary 90 70 - 99 mg/dL  Glucose, capillary     Status: None   Collection Time: 06/24/14  4:35 PM  Result Value Ref Range   Glucose-Capillary 92 70 - 99 mg/dL   Comment 1 Notify RN    Comment 2 Documented in Char   CBC     Status: Abnormal   Collection Time: 06/24/14  8:06 PM  Result Value Ref Range   WBC 15.5 (H) 4.0 - 10.5 K/uL   RBC 4.55 4.22 - 5.81 MIL/uL   Hemoglobin 13.9 13.0 - 17.0 g/dL   HCT 40.9 81.1 - 91.4 %   MCV 90.5 78.0 - 100.0 fL   MCH 30.5 26.0 - 34.0 pg   MCHC 33.7 30.0 - 36.0 g/dL   RDW 78.2 95.6 - 21.3 %   Platelets 207 150 - 400 K/uL  Renal function panel  Status: Abnormal   Collection Time: 06/24/14  8:07 PM  Result Value Ref Range   Sodium 129 (L) 135 - 145 mmol/L   Potassium 6.6 (HH) 3.5 - 5.1 mmol/L   Chloride 91 (L) 96 - 112 mmol/L   CO2 20 19 - 32 mmol/L   Glucose, Bld 84 70 - 99 mg/dL   BUN 88 (H) 6 - 23 mg/dL   Creatinine, Ser 16.10 (H) 0.50 - 1.35 mg/dL   Calcium 6.4 (LL) 8.4 - 10.5 mg/dL   Phosphorus 9.8 (H)  2.3 - 4.6 mg/dL   Albumin 2.9 (L) 3.5 - 5.2 g/dL   GFR calc non Af Amer 4 (L) >90 mL/min   GFR calc Af Amer 5 (L) >90 mL/min   Anion gap 18 (H) 5 - 15  Glucose, capillary     Status: None   Collection Time: 06/24/14 11:59 PM  Result Value Ref Range   Glucose-Capillary 83 70 - 99 mg/dL  Protime-INR     Status: Abnormal   Collection Time: 06/25/14  4:19 AM  Result Value Ref Range   Prothrombin Time 16.3 (H) 11.6 - 15.2 seconds   INR 1.29 0.00 - 1.49  Basic metabolic panel     Status: Abnormal   Collection Time: 06/25/14  4:19 AM  Result Value Ref Range   Sodium 135 135 - 145 mmol/L   Potassium 4.6 3.5 - 5.1 mmol/L   Chloride 95 (L) 96 - 112 mmol/L   CO2 26 19 - 32 mmol/L   Glucose, Bld 90 70 - 99 mg/dL   BUN 45 (H) 6 - 23 mg/dL   Creatinine, Ser 9.60 (H) 0.50 - 1.35 mg/dL   Calcium 8.2 (L) 8.4 - 10.5 mg/dL   GFR calc non Af Amer 7 (L) >90 mL/min   GFR calc Af Amer 8 (L) >90 mL/min   Anion gap 14 5 - 15  CBC with Differential/Platelet     Status: Abnormal   Collection Time: 06/25/14  4:19 AM  Result Value Ref Range   WBC 11.6 (H) 4.0 - 10.5 K/uL   RBC 4.76 4.22 - 5.81 MIL/uL   Hemoglobin 14.6 13.0 - 17.0 g/dL   HCT 45.4 09.8 - 11.9 %   MCV 92.9 78.0 - 100.0 fL   MCH 30.7 26.0 - 34.0 pg   MCHC 33.0 30.0 - 36.0 g/dL   RDW 14.7 82.9 - 56.2 %   Platelets 170 150 - 400 K/uL   Neutrophils Relative % 72 43 - 77 %   Neutro Abs 8.4 (H) 1.7 - 7.7 K/uL   Lymphocytes Relative 13 12 - 46 %   Lymphs Abs 1.5 0.7 - 4.0 K/uL   Monocytes Relative 13 (H) 3 - 12 %   Monocytes Absolute 1.5 (H) 0.1 - 1.0 K/uL   Eosinophils Relative 2 0 - 5 %   Eosinophils Absolute 0.3 0.0 - 0.7 K/uL   Basophils Relative 0 0 - 1 %   Basophils Absolute 0.0 0.0 - 0.1 K/uL  Glucose, capillary     Status: Abnormal   Collection Time: 06/25/14  8:22 AM  Result Value Ref Range   Glucose-Capillary 114 (H) 70 - 99 mg/dL   No results found.  Assessment/Plan: Diagnosis: severe low back pain with radicular  symptoms 1. Does the need for close, 24 hr/day medical supervision in concert with the patient's rehab needs make it unreasonable for this patient to be served in a less intensive setting? Potentially 2. Co-Morbidities requiring supervision/potential complications: htn,  ESRD on HD 3. Due to bowel management, safety, skin/wound care, disease management, medication administration, pain management and patient education, does the patient require 24 hr/day rehab nursing? Potentially 4. Does the patient require coordinated care of a physician, rehab nurse, PT (1-2 hrs/day, 5 days/week) and OT (1-2 hrs/day, 5 days/week) to address physical and functional deficits in the context of the above medical diagnosis(es)? Potentially Addressing deficits in the following areas: balance, endurance, locomotion, strength, transferring, bowel/bladder control, bathing, dressing, feeding, grooming and toileting 5. Can the patient actively participate in an intensive therapy program of at least 3 hrs of therapy per day at least 5 days per week? Potentially 6. The potential for patient to make measurable gains while on inpatient rehab is good and fair 7. Anticipated functional outcomes upon discharge from inpatients are ?mod I PT, ?mod I OT, n/aSLP 8. Estimated rehab length of stay to reach the above functional goals is: TBD 9. Does the patient have adequate social supports to accommodate these discharge functional goals? Potentially 10. Anticipated D/C setting: Home 11. Anticipated post D/C treatments: HH therapy and Outpatient therapy 12. Overall Rehab/Functional Prognosis: TBD  RECOMMENDATIONS: This patient's condition is appropriate for continued rehabilitative care in the following setting: To be determined based on MRI findings and acute care course.  Patient has agreed to participate in recommended program. Potentially Note that insurance prior authorization may be required for reimbursement for recommended  care.  Comment: Will follow along for MRI results. He has some radicular symptoms but exam was equivocal for radicular pain. Need to have some medical necessity to stay on inpatient rehab as well.  Thanks  Ranelle Oyster, MD, [facility] Physical Medicine & Rehabilitation 06/25/2014    Charlton Amor. 06/25/2014

## 2014-06-26 DIAGNOSIS — I10 Essential (primary) hypertension: Secondary | ICD-10-CM

## 2014-06-26 DIAGNOSIS — E875 Hyperkalemia: Secondary | ICD-10-CM | POA: Insufficient documentation

## 2014-06-26 DIAGNOSIS — I259 Chronic ischemic heart disease, unspecified: Secondary | ICD-10-CM

## 2014-06-26 LAB — PROTIME-INR
INR: 1.12 (ref 0.00–1.49)
Prothrombin Time: 14.5 seconds (ref 11.6–15.2)

## 2014-06-26 LAB — CBC WITH DIFFERENTIAL/PLATELET
BASOS ABS: 0 10*3/uL (ref 0.0–0.1)
Basophils Relative: 0 % (ref 0–1)
EOS ABS: 0.4 10*3/uL (ref 0.0–0.7)
Eosinophils Relative: 3 % (ref 0–5)
HCT: 39.9 % (ref 39.0–52.0)
Hemoglobin: 13.2 g/dL (ref 13.0–17.0)
Lymphocytes Relative: 13 % (ref 12–46)
Lymphs Abs: 1.8 10*3/uL (ref 0.7–4.0)
MCH: 30.5 pg (ref 26.0–34.0)
MCHC: 33.1 g/dL (ref 30.0–36.0)
MCV: 92.1 fL (ref 78.0–100.0)
Monocytes Absolute: 1.8 10*3/uL — ABNORMAL HIGH (ref 0.1–1.0)
Monocytes Relative: 13 % — ABNORMAL HIGH (ref 3–12)
NEUTROS PCT: 71 % (ref 43–77)
Neutro Abs: 9.6 10*3/uL — ABNORMAL HIGH (ref 1.7–7.7)
PLATELETS: 187 10*3/uL (ref 150–400)
RBC: 4.33 MIL/uL (ref 4.22–5.81)
RDW: 15.3 % (ref 11.5–15.5)
WBC: 13.6 10*3/uL — AB (ref 4.0–10.5)

## 2014-06-26 LAB — BASIC METABOLIC PANEL
ANION GAP: 16 — AB (ref 5–15)
BUN: 76 mg/dL — ABNORMAL HIGH (ref 6–23)
CHLORIDE: 91 mmol/L — AB (ref 96–112)
CO2: 26 mmol/L (ref 19–32)
CREATININE: 10.24 mg/dL — AB (ref 0.50–1.35)
Calcium: 7.1 mg/dL — ABNORMAL LOW (ref 8.4–10.5)
GFR calc Af Amer: 6 mL/min — ABNORMAL LOW (ref >90)
GFR calc non Af Amer: 5 mL/min — ABNORMAL LOW (ref >90)
Glucose, Bld: 81 mg/dL (ref 70–99)
Potassium: 6.3 mmol/L (ref 3.5–5.1)
SODIUM: 133 mmol/L — AB (ref 135–145)

## 2014-06-26 LAB — GLUCOSE, CAPILLARY
Glucose-Capillary: 110 mg/dL — ABNORMAL HIGH (ref 70–99)
Glucose-Capillary: 125 mg/dL — ABNORMAL HIGH (ref 70–99)

## 2014-06-26 MED ORDER — OXYCODONE HCL 5 MG PO TABS
ORAL_TABLET | ORAL | Status: AC
  Filled 2014-06-26: qty 2

## 2014-06-26 NOTE — Procedures (Signed)
I was present at this session.  I have reviewed the session itself and made appropriate changes.  HD via L groin AVG.  olc green. Access press ok.  sleeping  [name] L 3/1/20167:59 AM

## 2014-06-26 NOTE — Progress Notes (Signed)
OT Cancellation Note  Patient Details Name: [name] MRN: [id number] DOB: [birth year]   Cancelled Treatment:    Reason Eval/Treat Not Completed: Fatigue/lethargy limiting ability to participate. Pt recently returned from HD and eating as well. Pt requested OT return later. OT will re attempt later today as time allows  Galen Manila 06/26/2014, 12:08 PM

## 2014-06-26 NOTE — Progress Notes (Signed)
  Date: 06/26/2014  Patient name: [name]  Medical record number: [id number]  Date of birth: 03/10/1956   This patient's plan of care was discussed with Dr. Mikey Bussing and Dr. Danella Penton. Please see their note for complete details. I concur with their findings.   Randall Hiss, MD 06/26/2014, 1:31 PM

## 2014-06-26 NOTE — Consult Note (Signed)
Cardiologist:  Hilty Reason for Consult: Pre Op Referring Physician:   KAIVEN [name] is an 59 y.o. male.  HPI:   OSTEN JANEK is a 59 y.o. male with history of CAD. He is ESRD on HD, DM, neuropathy, HTN, HL. Patient was admitted in January 2015 with an inferior STEMI.  He had 3 vessel disease, but he was not felt to be a candidate for emergent CABG. He had a DES to the LAD and PCI to the RCA was attempted but it was occluded and could not be crossed. The CFX also had high-grade stenosis and will be treated medically. His EF was preserved. There were problems with sedation as he has chronic MS pain issues. Anesthesia assisted during the case and propofol was used for sedation. With this, he tolerated the procedure without complication. He was started on a beta blocker and a statin. He is not on an ACE inhibitor because of his renal dysfunction. A ventriculogram performed during the cath showed some basal wall hypokinesis. An echocardiogram was performed but the images were poor. 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   Patient was originally admitted with a chief complaint of right low back pain.  He needs an MRI and we are asked to consult for preop clearance because he needs to be under full anesthesia.  Patient states he has felt pre-well from a cardiac standpoint since his heart cath over a year ago.  He denies chest pain, shortness of breath, orthopnea, lower extremity edema, nausea, vomiting, fever and abdominal pain.  He was also found to have a superficial thrombus in his leg.  Dr. Oneida Alar was consulted and was not felt Coumadin was necessary and worst the risk.  Continues take aspirin and Brilinta..      Past Medical History  Diagnosis Date  . Anxiety   . Back pain   . GERD (gastroesophageal reflux disease)   . Peripheral neuropathy   . Arthritis   . CHF (congestive heart failure)   . Active smoker   . ESRD on hemodialysis     [name] 4 days per week on M-Tu-Wed and Fri.  Started HD in 1998 and has been on HD initially at [facility], then went to [facility], then in [facility], then to [facility] and now is at [facility] for last 10 years.  Has L thigh AVG.     . Diabetes mellitus     diet controlled  . 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 PONV (postoperative nausea and vomiting)   . Hypertension   . Bell palsy   . Heart murmur   . Carpal tunnel syndrome     bilateral  . Dysrhythmia     Hx: of palpitataions  . Headache(784.0)     Hx: of Migraines    Past Surgical History  Procedure Laterality Date  . Total hip arthroplasty    . Thyroidectomy    . Tooth extraction    . Mandible fracture surgery    . Dg av dialysis graft declot or    . Insertion of dialysis catheter  03/28/2011    Procedure: INSERTION OF DIALYSIS CATHETER;  Surgeon: Mal Misty, MD;  Location: [place];  Service: Vascular;  Laterality: Left;  . Av fistula placement  03/31/2011    Procedure: INSERTION OF ARTERIOVENOUS (AV) GORE-TEX GRAFT THIGH;  Surgeon: Juanda Crumble  Antony Blackbird, MD;  Location: [place];  Service: Vascular;  Laterality: Right;  redo right thigh arteriovenous gortex graft using gore-tex stretch 61m x 70cm  . Thrombectomy w/ embolectomy  06/02/2011    Procedure: THROMBECTOMY ARTERIOVENOUS GORE-TEX GRAFT;  Surgeon: CAngelia Mould MD;  [place]  Service: Vascular;  Laterality: Right;  Thrombectomy right thigh arteriovenous gortex graft;  revision by  replacement of large portion of graft with 749mgore-tex   . Insertion of dialysis catheter  08/28/2011    Procedure: INSERTION OF DIALYSIS CATHETER;  Surgeon: BrConrad BurlingtonMD;  [place]: [place];  Laterality: Left;  Atempted Bilateral Internal Jugular, Bilateral Subclavin insertion of 55cm Dialysis Catheter Left Femoral  . Insertion of dialysis catheter  12/15/2011    Procedure: INSERTION OF DIALYSIS CATHETER;  Surgeon: ChElam DutchMD;  [place]: [place];  Laterality: Right;  Insertion of Right Femoral Dialysis Catheter  . Exchange of a dialysis catheter  02/26/2012    Procedure: EXCHANGE OF A DIALYSIS CATHETER;  Surgeon: ToRosetta PosnerMD;  [place] Service: V[place];  Laterality: Right;  . Joint replacement    . Exchange of a dialysis catheter  05/18/2012    Procedure: EXCHANGE OF A DIALYSIS CATHETER;  Surgeon: ToRosetta PosnerMD;  [place] Service: Vascular;  Laterality: Right;  right femoral dialysis catheter  . Av fistula placement  05/18/2012    Procedure: INSERTION OF ARTERIOVENOUS (AV) GORE-TEX GRAFT THIGH;  Surgeon: ToRosetta PosnerMD;  [facility];  Service: Vascular;  Laterality: Left;  using 83m65my 70cm goretex graft  . Thrombectomy w/ embolectomy Left 08/25/2012    Procedure: THROMBECTOMY ARTERIOVENOUS GORE-TEX GRAFT;  Surgeon: JamMal MistyD;  [facility] [place] [place];  Laterality: Left;  Attempted thrombectomy of left thigh arteriovenous gortex graft.   . Av fistula placement Left 08/25/2012    Procedure: INSERTION OF ARTERIOVENOUS (AV) GORE-TEX GRAFT THIGH;  Surgeon: JamMal MistyD;  [facility];  Service: Vascular;  Laterality: Left;  Using 83mm24m40cm vascular Gortex graft.  . Revision of arteriovenous goretex graft Left 12/14/2012    Procedure: Revision of Left Thigh Graft;  Surgeon: ToddRosetta Posner;  [facility] [place];  Laterality: Left;  . Avgg removal Left 02/16/2013    Procedure: EXCISION OF LEFT ARM ARTERIOVENOUS GORETEX GRAFT TIMES 2 WITH VEIN PATCH ANGIOPLASTY OF BRACIAL ARTERY.  ;  Surgeon: ChriAngelia Mould;  [facility]ascular;  Laterality: Left;  Converted from [facility].    . Revision of arteriovenous goretex graft Left 08/08/2013    Procedure: REVISION OF LEFT THIGH ARTERIOVENOUS GORETEX GRAFT;  Surgeon: CharElam Dutch;  [facility] [place];  Laterality: Left;  . Venogram Bilateral 10/27/2011     Procedure: VENOGRAM;  Surgeon: VancSerafina Mitchell;  [facility];  Service: Cardiovascular;  Laterality: Bilateral;  bilat upper extrem venograms  . Left heart cath Bilateral 05/21/2013    Procedure: LEFT HEART CATH;  Surgeon: MuhaWellington Hampshire;  [facility];  Service: Cardiovascular;  Laterality: Bilateral;  . Percutaneous coronary stent intervention (pci-s)  05/21/2013    Procedure: PERCUTANEOUS CORONARY STENT INTERVENTION (PCI-S);  Surgeon: MuhaWellington Hampshire;  [facility];  Service: Cardiovascular;;  . Coronary angioplasty with stent placement      Family History  Problem Relation Age of Onset  . Diabetes Mother   . Stroke Mother   . Heart disease Mother   . Kidney disease Father   . Hyperlipidemia Father     Social History:  reports that he has been smoking Cigarettes.  He has a 40 pack-year smoking history. He has never used smokeless tobacco. He reports that he does not drink alcohol or use illicit drugs.  Allergies: No Known Allergies  Medications: Scheduled Meds: . aspirin EC  81 mg Oral Daily  . atorvastatin  80 mg Oral q1800  . calcium carbonate  3 tablet Oral TID  . fentaNYL  50 mcg Transdermal Q72H  . gabapentin  100 mg Oral BID  . heparin subcutaneous  5,000 Units Subcutaneous 3 times per day  . lanthanum  1,000 mg Oral TID WC  . metoprolol tartrate  25 mg Oral BID  . multivitamin  1 tablet Oral QHS  . oxyCODONE      . pantoprazole  80 mg Oral Q1200  . sodium chloride  3 mL Intravenous Q12H  . sodium chloride  3 mL Intravenous Q12H  . ticagrelor  90 mg Oral BID   Continuous Infusions:  PRN Meds:.oxyCODONE   Results for orders placed or performed during the hospital encounter of 06/19/14 (from the past 48 hour(s))  CBC     Status: Abnormal   Collection Time: 06/24/14  8:06 PM  Result Value Ref Range   WBC 15.5 (H) 4.0 - 10.5 K/uL   RBC 4.55 4.22 - 5.81 MIL/uL   Hemoglobin 13.9 13.0 - 17.0 g/dL   HCT 41.2 39.0 - 52.0 %    MCV 90.5 78.0 - 100.0 fL   MCH 30.5 26.0 - 34.0 pg   MCHC 33.7 30.0 - 36.0 g/dL   RDW 15.5 11.5 - 15.5 %   Platelets 207 150 - 400 K/uL  Renal function panel     Status: Abnormal   Collection Time: 06/24/14  8:07 PM  Result Value Ref Range   Sodium 129 (L) 135 - 145 mmol/L   Potassium 6.6 (HH) 3.5 - 5.1 mmol/L    Comment: REPEATED TO VERIFY CRITICAL RESULT CALLED TO, READ BACK BY AND VERIFIED WITH: RENEE [facility] RN AT 2042 06/24/14 BY WOOLLENK    Chloride 91 (L) 96 - 112 mmol/L   CO2 20 19 - 32 mmol/L   Glucose, Bld 84 70 - 99 mg/dL   BUN 88 (H) 6 - 23 mg/dL   Creatinine, Ser 11.50 (H) 0.50 - 1.35 mg/dL   Calcium 6.4 (LL) 8.4 - 10.5 mg/dL    Comment: REPEATED TO VERIFY CRITICAL RESULT CALLED TO, READ BACK BY AND VERIFIED WITH: RENEE [facility] RN AT 2042 06/24/14 BY WOOLLENK    Phosphorus 9.8 (H) 2.3 - 4.6 mg/dL   Albumin 2.9 (L) 3.5 - 5.2 g/dL   GFR calc non Af Amer 4 (L) >90 mL/min   GFR calc Af Amer 5 (L) >90 mL/min    Comment: (NOTE) The eGFR has been calculated using the CKD EPI equation. This calculation has not been validated in all clinical situations. eGFR's persistently <90 mL/min signify possible Chronic Kidney Disease.    Anion gap 18 (H) 5 - 15  Glucose, capillary     Status: None   Collection Time: 06/24/14 11:59 PM  Result Value Ref Range   Glucose-Capillary 83 70 - 99 mg/dL  Protime-INR     Status: Abnormal   Collection Time: 06/25/14  4:19 AM  Result Value Ref  Range   Prothrombin Time 16.3 (H) 11.6 - 15.2 seconds   INR 1.29 0.00 - 7.71  Basic metabolic panel     Status: Abnormal   Collection Time: 06/25/14  4:19 AM  Result Value Ref Range   Sodium 135 135 - 145 mmol/L   Potassium 4.6 3.5 - 5.1 mmol/L    Comment: DELTA CHECK NOTED   Chloride 95 (L) 96 - 112 mmol/L   CO2 26 19 - 32 mmol/L   Glucose, Bld 90 70 - 99 mg/dL   BUN 45 (H) 6 - 23 mg/dL    Comment: DELTA CHECK NOTED   Creatinine, Ser 7.84 (H) 0.50 - 1.35 mg/dL   Calcium 8.2 (L) 8.4 - 10.5 mg/dL     GFR calc non Af Amer 7 (L) >90 mL/min   GFR calc Af Amer 8 (L) >90 mL/min    Comment: (NOTE) The eGFR has been calculated using the CKD EPI equation. This calculation has not been validated in all clinical situations. eGFR's persistently <90 mL/min signify possible Chronic Kidney Disease.    Anion gap 14 5 - 15  CBC with Differential/Platelet     Status: Abnormal   Collection Time: 06/25/14  4:19 AM  Result Value Ref Range   WBC 11.6 (H) 4.0 - 10.5 K/uL   RBC 4.76 4.22 - 5.81 MIL/uL   Hemoglobin 14.6 13.0 - 17.0 g/dL   HCT 44.2 39.0 - 52.0 %   MCV 92.9 78.0 - 100.0 fL   MCH 30.7 26.0 - 34.0 pg   MCHC 33.0 30.0 - 36.0 g/dL   RDW 15.5 11.5 - 15.5 %   Platelets 170 150 - 400 K/uL   Neutrophils Relative % 72 43 - 77 %   Neutro Abs 8.4 (H) 1.7 - 7.7 K/uL   Lymphocytes Relative 13 12 - 46 %   Lymphs Abs 1.5 0.7 - 4.0 K/uL   Monocytes Relative 13 (H) 3 - 12 %   Monocytes Absolute 1.5 (H) 0.1 - 1.0 K/uL   Eosinophils Relative 2 0 - 5 %   Eosinophils Absolute 0.3 0.0 - 0.7 K/uL   Basophils Relative 0 0 - 1 %   Basophils Absolute 0.0 0.0 - 0.1 K/uL  Glucose, capillary     Status: Abnormal   Collection Time: 06/25/14  8:22 AM  Result Value Ref Range   Glucose-Capillary 114 (H) 70 - 99 mg/dL  Glucose, capillary     Status: None   Collection Time: 06/25/14 12:15 PM  Result Value Ref Range   Glucose-Capillary 92 70 - 99 mg/dL  Glucose, capillary     Status: Abnormal   Collection Time: 06/25/14 10:29 PM  Result Value Ref Range   Glucose-Capillary 117 (H) 70 - 99 mg/dL  Basic metabolic panel     Status: Abnormal   Collection Time: 06/26/14  5:00 AM  Result Value Ref Range   Sodium 133 (L) 135 - 145 mmol/L   Potassium 6.3 (HH) 3.5 - 5.1 mmol/L    Comment: CRITICAL RESULT CALLED TO, READ BACK BY AND VERIFIED WITH: ZHAO B RN 06/26/14 0741 COSTELLO B REPEATED TO VERIFY    Chloride 91 (L) 96 - 112 mmol/L   CO2 26 19 - 32 mmol/L   Glucose, Bld 81 70 - 99 mg/dL   BUN 76 (H) 6 - 23  mg/dL    Comment: DELTA CHECK NOTED   Creatinine, Ser 10.24 (H) 0.50 - 1.35 mg/dL   Calcium 7.1 (L) 8.4 - 10.5 mg/dL  GFR calc non Af Amer 5 (L) >90 mL/min   GFR calc Af Amer 6 (L) >90 mL/min    Comment: (NOTE) The eGFR has been calculated using the CKD EPI equation. This calculation has not been validated in all clinical situations. eGFR's persistently <90 mL/min signify possible Chronic Kidney Disease.    Anion gap 16 (H) 5 - 15  CBC with Differential/Platelet     Status: Abnormal   Collection Time: 06/26/14  5:00 AM  Result Value Ref Range   WBC 13.6 (H) 4.0 - 10.5 K/uL   RBC 4.33 4.22 - 5.81 MIL/uL   Hemoglobin 13.2 13.0 - 17.0 g/dL   HCT 39.9 39.0 - 52.0 %   MCV 92.1 78.0 - 100.0 fL   MCH 30.5 26.0 - 34.0 pg   MCHC 33.1 30.0 - 36.0 g/dL   RDW 15.3 11.5 - 15.5 %   Platelets 187 150 - 400 K/uL   Neutrophils Relative % 71 43 - 77 %   Lymphocytes Relative 13 12 - 46 %   Monocytes Relative 13 (H) 3 - 12 %   Eosinophils Relative 3 0 - 5 %   Basophils Relative 0 0 - 1 %   Neutro Abs 9.6 (H) 1.7 - 7.7 K/uL   Lymphs Abs 1.8 0.7 - 4.0 K/uL   Monocytes Absolute 1.8 (H) 0.1 - 1.0 K/uL   Eosinophils Absolute 0.4 0.0 - 0.7 K/uL   Basophils Absolute 0.0 0.0 - 0.1 K/uL   Smear Review MORPHOLOGY UNREMARKABLE   Protime-INR     Status: None   Collection Time: 06/26/14 12:12 PM  Result Value Ref Range   Prothrombin Time 14.5 11.6 - 15.2 seconds   INR 1.12 0.00 - 1.49  Glucose, capillary     Status: Abnormal   Collection Time: 06/26/14 12:21 PM  Result Value Ref Range   Glucose-Capillary 110 (H) 70 - 99 mg/dL  Glucose, capillary     Status: Abnormal   Collection Time: 06/26/14  5:07 PM  Result Value Ref Range   Glucose-Capillary 125 (H) 70 - 99 mg/dL    No results found.  Review of Systems  Constitutional: Negative for fever.  HENT: Negative for congestion.   Respiratory: Negative for cough and shortness of breath.   Cardiovascular: Negative for chest pain, orthopnea, leg  swelling and PND.  Gastrointestinal: Negative for nausea, vomiting and abdominal pain.  Musculoskeletal: Negative for myalgias.  Neurological: Negative for dizziness and weakness.  All other systems reviewed and are negative.  Blood pressure 92/51, pulse 72, temperature 98.7 F (37.1 C), temperature source Oral, resp. rate 17, height _0  (1.803 m), weight 257 lb 15 oz (117 kg), SpO2 95 %. Physical Exam  Nursing note and vitals reviewed. Constitutional: He is oriented to person, place, and time. He appears well-developed and well-nourished.  HENT:  Head: Normocephalic and atraumatic.  Eyes: EOM are normal. Pupils are equal, round, and reactive to light.  Neck: Normal range of motion. Neck supple.  Cardiovascular: Normal rate, regular rhythm, S1 normal and S2 normal.   No murmur heard. Pulses:      Radial pulses are 1+ on the right side, and 2+ on the left side.       Dorsalis pedis pulses are 1+ on the right side, and 1+ on the left side.  Respiratory: Effort normal and breath sounds normal. He has no wheezes. He has no rales.  GI: Soft. Bowel sounds are normal. He exhibits no distension. There is no tenderness.  Musculoskeletal: He exhibits no edema.  Neurological: He is alert and oriented to person, place, and time. He exhibits normal muscle tone.  Skin: Skin is warm and dry.  Psychiatric: He has a normal mood and affect.    Assessment/Plan: Principal Problem:   Right leg pain Active Problems:   End-stage renal disease on hemodialysis   HTN (hypertension)   Radicular leg pain   Right-sided low back pain with right-sided sciatica   Acute superficial venous thrombosis of lower extremity   Hyperkalemia  Plan NATHANIAL ARRIGHI is a 59 y.o. male with history of CAD. He is ESRD on HD, DM, neuropathy, HTN, HL. Patient was admitted in January 2015 with an inferior STEMI.  He had 3 vessel disease, but he was not felt to be a candidate for emergent CABG. He had a DES to the LAD and PCI  to the RCA was attempted but it was occluded and could not be crossed. The CFX also had high-grade stenosis and will be treated medically. His EF was preserved.  He is ok for MRI with anesthesia.  He is greater than 1 year out from his initial stenting, should he need surgery, okay to stop Brilinta then restart post procedure.   Tarri Fuller 06/26/2014, 6:12 PM      Agree with note written by Luisa Dago [facility]  ATSP for cardiac clearance to have general anesthesia during a back MRI. H/O CAD s/p stenting 1/15 in setting of Inferior MI with DES. Preserved LV fxn. On DAPT. Anatomy stable at that time. No CP since. OK for grneral anesthesia at low CV risk. Can stop DAPT if surgery required 7 days prior to procedure and restart when OK with surgery. Call if we can be of further assistance  Lorretta Harp 06/26/2014 8:14 PM

## 2014-06-26 NOTE — Progress Notes (Signed)
Subjective: Interval History: has complaints hands sore, anxious to get to rehab.  Objective: Vital signs in last 24 hours: Temp:  [97.7 F (36.5 C)-98.5 F (36.9 C)] 97.7 F (36.5 C) (03/01 0652) Pulse Rate:  [64-75] 66 (03/01 0730) Resp:  [10-17] 10 (03/01 0730) BP: (121-146)/(52-71) 146/71 mmHg (03/01 0730) SpO2:  [97 %-98 %] 98 % (03/01 0652) Weight:  [117.1 kg (258 lb 2.5 oz)-121 kg (266 lb 12.1 oz)] 121 kg (266 lb 12.1 oz) (03/01 6789) Weight change: -4.9 kg (-10 lb 12.8 oz)  Intake/Output from previous day: 02/29 0701 - 03/01 0700 In: 220 [P.O.:220] Out: 0  Intake/Output this shift:    General appearance: cooperative, moderately obese and slowed mentation Resp: diminished breath sounds bilaterally Cardio: S1, S2 normal and systolic murmur: holosystolic 2/6, blowing at apex GI: obese , pos bs, soft. liver down 5 cm Extremities: AVG L groin  Lab Results:  Recent Labs  06/24/14 2006 06/25/14 0419  WBC 15.5* 11.6*  HGB 13.9 14.6  HCT 41.2 44.2  PLT 207 170   BMET:  Recent Labs  06/25/14 0419 06/26/14 0500  NA 135 133*  K 4.6 6.3*  CL 95* 91*  CO2 26 26  GLUCOSE 90 81  BUN 45* 76*  CREATININE 7.84* 10.24*  CALCIUM 8.2* 7.1*   No results for input(s): PTH in the last 72 hours. Iron Studies: No results for input(s): IRON, TIBC, TRANSFERRIN, FERRITIN in the last 72 hours.  Studies/Results: No results found.  I have reviewed the patient's current medications.  Assessment/Plan: 1 ESRD HD today.  MTWF.  Set up for am. 2 Anemia not an issue 3 DJD 4 LBP per primary.  Has seen ortho in past, ?? Plans 5 obesity 6 Hypoparathyroid P HD, rehab, 3.5 bath    LOS: 6 days   Deward Sebek L 06/26/2014,7:59 AM

## 2014-06-26 NOTE — Progress Notes (Signed)
Subjective: Seen during HD. Pt resting comfortably on back, 4 hours into dialysis with 30 mins left. Complaining of back pain and rt shoulder pain. Interested in SNF and CIR. Informed about MRI of lumbar spine tomorrow.   Objective: Vital signs in last 24 hours: Filed Vitals:   06/26/14 0930 06/26/14 1000 06/26/14 1015 06/26/14 1030  BP: 129/47 126/56 96/63 112/57  Pulse: 70 84 80 73  Temp:      TempSrc:      Resp: Height:      Weight:      SpO2:       Weight change: -10 lb 12.8 oz (-4.9 kg)  Intake/Output Summary (Last 24 hours) at 06/26/14 1042 Last data filed at 06/25/14 1306  Gross per 24 hour  Intake      0 ml  Output      0 ml  Net      0 ml   General: laying on back, NAD Lungs: clear to anterior auscultation Cardiac: RRR, no murmurs GI: soft, active bowel sounds, non TTP Neuro: CN II-XII grossly intact  Lab Results: Basic Metabolic Panel:  Recent Labs Lab 06/23/14 1215  06/24/14 2007 06/25/14 0419 06/26/14 0500  NA 134*  < > 129* 135 133*  K 4.4  < > 6.6* 4.6 6.3*  CL 94*  < > 91* 95* 91*  CO2 27  < > GLUCOSE 89  < > 84 90 81  BUN 48*  < > 88* 45* 76*  CREATININE 8.73*  < > 11.50* 7.84* 10.24*  CALCIUM 7.4*  < > 6.4* 8.2* 7.1*  PHOS 5.8*  --  9.8*  --   --   < > = values in this interval not displayed. Liver Function Tests:  Recent Labs Lab 06/19/14 1546  06/23/14 1215 06/24/14 2007  AST 28  --   --   --   ALT 16  --   --   --   ALKPHOS 49  --   --   --   BILITOT 1.0  --   --   --   PROT 6.9  --   --   --   ALBUMIN 3.0*  3.0*  < > 3.1* 2.9*  < > = values in this interval not displayed. CBC:  Recent Labs Lab 06/25/14 0419 06/26/14 0500  WBC 11.6* 13.6*  NEUTROABS 8.4* 9.6*  HGB 14.6 13.2  HCT 44.2 39.9  MCV 92.9 92.1  PLT 170 187   CBG:  Recent Labs Lab 06/24/14 1150 06/24/14 1635 06/24/14 2359 06/25/14 0822 06/25/14 1215 06/25/14 2229  GLUCAP 90 92 83 114* 92 117*    Studies/Results: No  results found. Medications: I have reviewed the patient's current medications. Scheduled Meds: . aspirin EC  81 mg Oral Daily  . atorvastatin  80 mg Oral q1800  . calcium carbonate  3 tablet Oral TID  . fentaNYL  50 mcg Transdermal Q72H  . gabapentin  100 mg Oral BID  . heparin subcutaneous  5,000 Units Subcutaneous 3 times per day  . lanthanum  1,000 mg Oral TID WC  . metoprolol tartrate  25 mg Oral BID  . multivitamin  1 tablet Oral QHS  . oxyCODONE      . pantoprazole  80 mg Oral Q1200  . sodium chloride  3 mL Intravenous Q12H  . sodium chloride  3 mL Intravenous Q12H  . ticagrelor  90 mg Oral BID  Continuous Infusions:  PRN Meds:.oxyCODONE Assessment/Plan: Principal Problem:   Right leg pain Active Problems:   End-stage renal disease on hemodialysis   HTN (hypertension)   Radicular leg pain   Right-sided low back pain with right-sided sciatica   Acute superficial venous thrombosis of lower extremity   Chronic lower back pain with worsening of rt lower back and rt leg pain  -  oxycodone 10mg  q4h prn and fentanyl patch q72 hours - CIR evaluate pt yesterday, awaiting MRI lumbar spine results - social work looking into SNF placement - scheduled to have MRI lumbar spine with general anesthesia tomorrow  Hyperkalemia-- potassium 6.3 this morning,  Currently in HD. - nephrology following  Superficial Rt LE thrombus-- initially started coumadin this admission which was then stopped after vascular surgery saw patient. Will order LE dopplers for patient in 2 weeks to ensure no propagation of thrombus. If it has not progressed will tx conservatively w/ warm compresses/ice/ NSAIDs.   ESRD-- on MTWF schedule - continue home TUMS and renal MVI - nephrology added forenol  Hx of STEMI--in 07/2013. cath revealed 3 vessel disease but he was not a candidate for emergent CABG. He had a DES to the LAD. He had an occluded RCA that could not be fixed.  - holding home nitro  -  continue home asa 81mg  and brilinta.   HTN - continue home lopressor 25mg  BID   HLD - continue home lipitor 80mg   Anxiety - holding home trazadone   DM with peripheral neuropathy-- diabetes is diet controlled. HbA1c 6.3 on 05/2010 - CBGs ACHS - cont home gabapentin 100mg  BID  GERD - cont home nexium  FEN - renal/carb mod diet  DVT ppx-- brilinta  Dispo:  Anticipated discharge date deferred at this time until medical conditions improved and pt has appropriate dispo placement, CIR vs SNF  The patient does have a current PCP Dyke Maes, MD) and does not need an [facility] hospital follow-up appointment after discharge.  The patient does not have transportation limitations that hinder transportation to clinic appointments. .Services Needed at time of discharge: Y = Yes, Blank = No PT:  CIR, 24 hr supervision/assistance   OT:   RN:   Equipment:   Other:     LOS: 6 days   Gara Kroner, MD  Pager # [phone] 06/26/2014, 1:12 PM

## 2014-06-26 NOTE — Clinical Social Work Placement (Signed)
Clinical Social Work Department CLINICAL SOCIAL WORK PLACEMENT NOTE 06/26/2014  Patient:  JAQUEZ, SEGRAVES  Account Number:  [id number] Admit date:  06/19/2014  Clinical Social Worker:  Genelle Bal, LCSW  Date/time:  06/26/2014 02:10 AM  Clinical Social Work is seeking post-discharge placement for this patient at the following level of care:   SKILLED NURSING   (*CSW will update this form in Epic as items are completed)   06/25/2014  Patient/family provided with [facility] Department of Clinical Social Work's list of facilities offering this level of care within the geographic area requested by the patient (or if unable, by the patient's family).  06/25/2014  Patient/family informed of their freedom to choose among providers that offer the needed level of care, that participate in Medicare, Medicaid or managed care program needed by the patient, have an available bed and are willing to accept the patient.    Patient/family informed of [facility]' ownership interest in [facility], as well as of the fact that they are under no obligation to receive care at this facility.  PASARR submitted to EDS on 06/25/2014 PASARR number received on 06/25/2014  FL2 transmitted to all facilities in geographic area requested by pt/family on  06/25/2014 FL2 transmitted to all facilities within larger geographic area on   Patient informed that his/her managed care company has contracts with or will negotiate with  certain facilities, including the following:     Patient/family informed of bed offers received:  06/26/2014 Patient chooses bed at [facility], [place] Physician recommends and patient chooses bed at    Patient to be transferred to [facility], [place] on   Patient to be transferred to facility by  Patient and family notified of transfer on  Name of family member notified:    The following physician request were entered in Epic:   Additional  Comments:     Genelle Bal, MSW, LCSW Licensed Clinical Social Worker Clinical Social Work Department [employer] [phone]

## 2014-06-26 NOTE — Evaluation (Signed)
Occupational Therapy Evaluation Patient Details Name: [name] MRN: [id number] DOB: 1956-02-24 Today's Date: 06/26/2014    History of Present Illness Patient is a 59 year old man with history of end-stage renal disease on hemodialysis, diabetes mellitus, hypertension, chronic back pain, and other problems as outlined in the medical history who reports pain in his right lower back radiating into his right lower extremity which is worse during hemodialysis sessions. The pain is so severe that he missed one recent HD session and cut short another.   Clinical Impression   Pt demonstrates decline in function and safety with ADLs and ADL mobility with decreased strength, balance and endurance. Pt would benefit from acute OT services to address impairments to increase level of function and safety     Follow Up Recommendations  CIR    Equipment Recommendations  Other (comment) (TBD at next venue of care)    Recommendations for Other Services       Precautions / Restrictions Precautions Precautions: Fall Precaution Comments: Pt needs education on back precautions to minimize pain during transfers Restrictions Weight Bearing Restrictions: No      Mobility Bed Mobility Overal bed mobility: Needs Assistance Bed Mobility: Rolling;Sidelying to Sit Rolling: Modified independent (Device/Increase time) Sidelying to sit: Mod assist       General bed mobility comments: used bed rails, increased time  Transfers Overall transfer level: Needs assistance Equipment used: Rolling walker (2 wheeled);1 person hand held assist Transfers: Sit to/from [employer] Sit to Stand: Min assist Stand pivot transfers: Min assist       General transfer comment: Pt was able to power-up to full standing with min assist for balance and stability. Pt with flexed posture but unable to improve posture with cueing due to pain.     Balance   Sitting-balance support: No upper extremity  supported;Feet supported Sitting balance-Leahy Scale: Fair     Standing balance support: Bilateral upper extremity supported;During functional activity Standing balance-Leahy Scale: Poor                              ADL Overall ADL's : Needs assistance/impaired     Grooming: Wash/dry hands;Wash/dry face;Min guard;Sitting Grooming Details (indicate cue type and reason): Fair sitting balance Upper Body Bathing: Min guard;Sitting Upper Body Bathing Details (indicate cue type and reason): Fair sitting balance Lower Body Bathing: Moderate assistance   Upper Body Dressing : Min guard Upper Body Dressing Details (indicate cue type and reason): Fair sitting balance Lower Body Dressing: Maximal assistance;Total assistance   Toilet Transfer: Minimal assistance;BSC;RW;Cueing for safety   Toileting- Clothing Manipulation and Hygiene: Moderate assistance;Sit to/from stand       Functional mobility during ADLs: Moderate assistance;+2 for safety/equipment       Vision  glasses, hx of cataracts   Perception Perception Perception Tested?: No   Praxis Praxis Praxis tested?: Not tested    Pertinent Vitals/Pain Pain Score: 3  Pain Location: back Pain Descriptors / Indicators: Aching Pain Intervention(s): Limited activity within patient's tolerance;Monitored during session;Repositioned     Hand Dominance Left   Extremity/Trunk Assessment Upper Extremity Assessment Upper Extremity Assessment: Generalized weakness;Overall [facility] for tasks assessed   Lower Extremity Assessment Lower Extremity Assessment: Defer to PT evaluation   Cervical / Trunk Assessment Cervical / Trunk Assessment: Normal   Communication Communication Communication: No difficulties   Cognition Arousal/Alertness: Awake/alert Behavior During Therapy: Impulsive Overall Cognitive Status: Within Functional Limits for tasks assessed  General Comments   Pt pleasant and  cooperative                 Home Living Family/patient expects to be discharged to:: Inpatient rehab Living Arrangements: Parent Available Help at Discharge: Family;Available 24 hours/day Type of Home: House       Home Layout: One level     Bathroom Shower/Tub: Producer, television/film/video: Standard     Home Equipment: Environmental consultant - 2 wheels;Shower seat;Wheelchair - power;Cane - single point;Adaptive equipment Adaptive Equipment: Sock aid        Prior Functioning/Environment Level of Independence: Independent with assistive device(s)        Comments: Pt reports he used the RW all the time and used a sock aid to assist with dressing at home.     OT Diagnosis: Generalized weakness;Acute pain   OT Problem List: Decreased strength;Impaired balance (sitting and/or standing);Pain;Decreased activity tolerance;Decreased knowledge of use of DME or AE   OT Treatment/Interventions: Self-care/ADL training;DME and/or AE instruction;Therapeutic activities;Therapeutic exercise;Patient/family education;Balance training    OT Goals(Current goals can be found in the care plan section) Acute Rehab OT Goals Patient Stated Goal: "for pain to get better to do for myself like i used to" OT Goal Formulation: With patient Time For Goal Achievement: 07/03/14 Potential to Achieve Goals: Good ADL Goals Pt Will Perform Grooming: with supervision;with set-up;sitting Pt Will Perform Upper Body Bathing: with set-up;with supervision;sitting Pt Will Perform Lower Body Bathing: with min assist;sitting/lateral leans;sit to/from stand;with mod assist Pt Will Perform Upper Body Dressing: with set-up;with supervision;sitting Pt Will Perform Lower Body Dressing: with mod assist;sitting/lateral leans;sit to/from stand Pt Will Transfer to Toilet: with min guard assist;regular height toilet;bedside commode;ambulating;grab bars Pt Will Perform Toileting - Clothing Manipulation and hygiene: with min  assist;sit to/from stand  OT Frequency: Min 2X/week   Barriers to D/C: Decreased caregiver support                        End of Session Equipment Utilized During Treatment: Rolling walker;Other (comment) (BSC)  Activity Tolerance: Patient limited by fatigue;Patient limited by pain Patient left: in chair;with call bell/phone within reach;with chair alarm set   Time: 1349-1413 OT Time Calculation (min): 24 min Charges:  OT General Charges $OT Visit: 1 Procedure OT Evaluation $Initial OT Evaluation Tier I: 1 Procedure OT Treatments $Therapeutic Activity: 8-22 mins G-Codes:    Galen Manila 06/26/2014, 2:34 PM

## 2014-06-26 NOTE — Progress Notes (Signed)
Inpatient Rehabilitation  I met with [name] at the bedside to discuss his post acute rehab options.  He has concerns (as do I) that he can't tolerate 3 hours of therapies at a CIR llevel.  He awaits MRI.   Pt. Is currently slated to go to [facility] once tests are complete.  I updated Lorriane Shire from [place] from [facility] of my conversation with Mr. Mowrey.  I will follow up tomorrow.  Please call if questions.  [place] Admissions Coordinator Cell [phone] Office [phone]

## 2014-06-27 ENCOUNTER — Inpatient Hospital Stay (HOSPITAL_COMMUNITY): Payer: Medicare Other | Admitting: Anesthesiology

## 2014-06-27 ENCOUNTER — Encounter (HOSPITAL_COMMUNITY)
Admission: EM | Disposition: A | Discharge status: Skilled Nursing Facility | Payer: Self-pay | Source: Home / Self Care | Attending: Internal Medicine

## 2014-06-27 ENCOUNTER — Inpatient Hospital Stay (HOSPITAL_COMMUNITY): Payer: Medicare Other

## 2014-06-27 HISTORY — PX: RADIOLOGY WITH ANESTHESIA: SHX6223

## 2014-06-27 LAB — PROTIME-INR
INR: 1.09 (ref 0.00–1.49)
PROTHROMBIN TIME: 14.2 s (ref 11.6–15.2)

## 2014-06-27 LAB — GLUCOSE, CAPILLARY
GLUCOSE-CAPILLARY: 106 mg/dL — AB (ref 70–99)
GLUCOSE-CAPILLARY: 110 mg/dL — AB (ref 70–99)
Glucose-Capillary: 77 mg/dL (ref 70–99)
Glucose-Capillary: 113 mg/dL — ABNORMAL HIGH (ref 70–99)

## 2014-06-27 LAB — BASIC METABOLIC PANEL
Anion gap: 16 — ABNORMAL HIGH (ref 5–15)
BUN: 54 mg/dL — ABNORMAL HIGH (ref 6–23)
CALCIUM: 8.2 mg/dL — AB (ref 8.4–10.5)
CO2: 25 mmol/L (ref 19–32)
Chloride: 93 mmol/L — ABNORMAL LOW (ref 96–112)
Creatinine, Ser: 8.39 mg/dL — ABNORMAL HIGH (ref 0.50–1.35)
GFR calc non Af Amer: 6 mL/min — ABNORMAL LOW (ref >90)
GFR, EST AFRICAN AMERICAN: 7 mL/min — AB (ref >90)
GLUCOSE: 75 mg/dL (ref 70–99)
Potassium: 5.5 mmol/L — ABNORMAL HIGH (ref 3.5–5.1)
SODIUM: 134 mmol/L — AB (ref 135–145)

## 2014-06-27 SURGERY — RADIOLOGY WITH ANESTHESIA
Anesthesia: General

## 2014-06-27 MED ORDER — OXYCODONE HCL 5 MG PO TABS
5.0000 mg | ORAL_TABLET | Freq: Once | ORAL | Status: AC | PRN
  Administered 2014-06-27: 5 mg via ORAL

## 2014-06-27 MED ORDER — FENTANYL CITRATE 0.05 MG/ML IJ SOLN: 25.0000 ug | INTRAMUSCULAR | Status: DC | PRN

## 2014-06-27 MED ORDER — EPINEPHRINE HCL 0.1 MG/ML IJ SOSY
PREFILLED_SYRINGE | INTRAMUSCULAR | Status: DC | PRN
  Administered 2014-06-27: 100 ug via INTRAVENOUS

## 2014-06-27 MED ORDER — PROPOFOL 10 MG/ML IV BOLUS
INTRAVENOUS | Status: DC | PRN
  Administered 2014-06-27: 150 mg via INTRAVENOUS

## 2014-06-27 MED ORDER — MIDAZOLAM HCL 5 MG/5ML IJ SOLN
INTRAMUSCULAR | Status: DC | PRN
  Administered 2014-06-27: 1 mg via INTRAVENOUS

## 2014-06-27 MED ORDER — ONDANSETRON HCL 4 MG/2ML IJ SOLN: 4.0000 mg | Freq: Four times a day (QID) | INTRAMUSCULAR | Status: DC | PRN

## 2014-06-27 MED ORDER — PHENYLEPHRINE HCL 10 MG/ML IJ SOLN
10.0000 mg | INTRAVENOUS | Status: DC | PRN
  Administered 2014-06-27: 100 ug/min via INTRAVENOUS

## 2014-06-27 MED ORDER — LIDOCAINE HCL (CARDIAC) 20 MG/ML IV SOLN
INTRAVENOUS | Status: DC | PRN
  Administered 2014-06-27: 60 mg via INTRAVENOUS

## 2014-06-27 MED ORDER — SODIUM CHLORIDE 0.9 % IV SOLN
INTRAVENOUS | Status: DC | PRN
  Administered 2014-06-27: 08:00:00 via INTRAVENOUS

## 2014-06-27 MED ORDER — OXYCODONE HCL 5 MG/5ML PO SOLN: 5.0000 mg | Freq: Once | ORAL | Status: AC | PRN

## 2014-06-27 MED ORDER — PHENYLEPHRINE HCL 10 MG/ML IJ SOLN
INTRAMUSCULAR | Status: DC | PRN
  Administered 2014-06-27: 80 ug via INTRAVENOUS

## 2014-06-27 MED ORDER — OXYCODONE HCL 5 MG PO TABS
ORAL_TABLET | ORAL | Status: AC
  Filled 2014-06-27: qty 1

## 2014-06-27 NOTE — Consult Note (Signed)
Reason for Consult: Back and right leg pain Referring Physician: [facility]  [name] is an 59 y.o. male.  HPI: Patient is a 59 year old gentleman with multiple medical problems to include end-stage renal disease on hemodialysis coronary artery disease status post MI and multiple stents whose had over a year of back and right leg pain that's gone were severed necessitated admission approximately week ago. Patient reports pain probably over the right side of his low back to his hip down the front of his quad to his knee. He has occasional pain that goes below the shin but denies any significant amount of pain to the foot has some left leg pain but nowhere near the palm of his right. He has been able to her in the hospital feels overall the pain may be slightly improved from when he was admitted. Ultimately underwent an MRI scan of general anesthesia which showed a herniated disc and we have been consulted.  Past Medical History  Diagnosis Date  . Anxiety   . Back pain   . GERD (gastroesophageal reflux disease)   . Peripheral neuropathy   . Arthritis   . CHF (congestive heart failure)   . Active smoker   . ESRD on hemodialysis     [name] 4 days per week on M-Tu-Wed and Fri.  Started HD in 1998 and has been on HD initially at [facility], then went to [facility], then in [facility], then to [facility] and now is at [facility] for last 10 years.  Has L thigh AVG.     . Diabetes mellitus     diet controlled  . 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 PONV (postoperative nausea and vomiting)   . Hypertension   . Bell palsy   . Heart murmur   . Carpal tunnel syndrome     bilateral  . Dysrhythmia     Hx: of palpitataions  . Headache(784.0)     Hx: of Migraines    Past Surgical History  Procedure Laterality Date  . Total hip arthroplasty    . Thyroidectomy    . Tooth extraction    . Mandible fracture surgery    . Dg av dialysis graft declot or    . Insertion  of dialysis catheter  03/28/2011    Procedure: INSERTION OF DIALYSIS CATHETER;  Surgeon: Mal Misty, MD;  Location: [place];  Service: Vascular;  Laterality: Left;  . Av fistula placement  03/31/2011    Procedure: INSERTION OF ARTERIOVENOUS (AV) GORE-TEX GRAFT THIGH;  Surgeon: Elam Dutch, MD;  [facility];  Service: Vascular;  Laterality: Right;  redo right thigh arteriovenous gortex graft using gore-tex stretch 66m x 70cm  . Thrombectomy w/ embolectomy  06/02/2011    Procedure: THROMBECTOMY ARTERIOVENOUS GORE-TEX GRAFT;  Surgeon: CAngelia Mould MD;  [place]  Service: Vascular;  Laterality: Right;  Thrombectomy right thigh arteriovenous gortex graft;  revision by  replacement of large portion of graft with 744mgore-tex   . Insertion of dialysis catheter  08/28/2011    Procedure: INSERTION OF DIALYSIS CATHETER;  Surgeon: BrConrad BurlingtonMD;  [place];  Laterality: Left;  Atempted Bilateral Internal Jugular, Bilateral Subclavin insertion of 55cm Dialysis Catheter Left Femoral  . Insertion of dialysis catheter  12/15/2011    Procedure: INSERTION OF DIALYSIS CATHETER;  Surgeon: ChElam DutchMD;  [place] [facility]  Service: Vascular;  Laterality: Right;  Insertion of Right Femoral Dialysis Catheter  . Exchange of a dialysis catheter  02/26/2012    Procedure: EXCHANGE OF A DIALYSIS CATHETER;  Surgeon: Rosetta Posner, MD;  Location: [place];  Service: Vascular;  Laterality: Right;  . Joint replacement    . Exchange of a dialysis catheter  05/18/2012    Procedure: EXCHANGE OF A DIALYSIS CATHETER;  Surgeon: Rosetta Posner, MD;  Location: [place];  Service: Vascular;  Laterality: Right;  right femoral dialysis catheter  . Av fistula placement  05/18/2012    Procedure: INSERTION OF ARTERIOVENOUS (AV) GORE-TEX GRAFT THIGH;  Surgeon: Rosetta Posner, MD;  Location: [name] OR;  Service: Vascular;  Laterality: Left;  using 66m by 70cm goretex graft  . Thrombectomy w/ embolectomy  Left 08/25/2012    Procedure: THROMBECTOMY ARTERIOVENOUS GORE-TEX GRAFT;  Surgeon: JMal Misty MD;  [place]  Service: Vascular;  Laterality: Left;  Attempted thrombectomy of left thigh arteriovenous gortex graft.   . Av fistula placement Left 08/25/2012    Procedure: INSERTION OF ARTERIOVENOUS (AV) GORE-TEX GRAFT THIGH;  Surgeon: JMal Misty MD;  [facility];  Service: Vascular;  Laterality: Left;  Using 613mx 40cm vascular Gortex graft.  . Revision of arteriovenous goretex graft Left 12/14/2012    Procedure: Revision of Left Thigh Graft;  Surgeon: ToRosetta PosnerMD;  [place] [facility] Service: Vascular;  Laterality: Left;  . Avgg removal Left 02/16/2013    Procedure: EXCISION OF LEFT ARM ARTERIOVENOUS GORETEX GRAFT TIMES 2 WITH VEIN PATCH ANGIOPLASTY OF BRACIAL ARTERY.  ;  Surgeon: ChAngelia MouldMD;  [facility];  Laterality: Left;  [facility]    . Revision of arteriovenous goretex graft Left 08/08/2013    Procedure: REVISION OF LEFT THIGH ARTERIOVENOUS GORETEX GRAFT;  Surgeon: ChElam DutchMD;  [place] [facility] Service: Vascular;  Laterality: Left;  . Venogram Bilateral 10/27/2011    Procedure: VENOGRAM;  Surgeon: VaSerafina MitchellMD;  [facility];  Service: Cardiovascular;  Laterality: Bilateral;  bilat upper extrem venograms  . Left heart cath Bilateral 05/21/2013    Procedure: LEFT HEART CATH;  Surgeon: MuWellington HampshireMD;  [facility];  Service: Cardiovascular;  Laterality: Bilateral;  . Percutaneous coronary stent intervention (pci-s)  05/21/2013    Procedure: PERCUTANEOUS CORONARY STENT INTERVENTION (PCI-S);  Surgeon: MuWellington HampshireMD;  [facility];  Service: Cardiovascular;;  . Coronary angioplasty with stent placement      Family History  Problem Relation Age of Onset  . Diabetes Mother   . Stroke Mother   . Heart disease Mother   . Kidney disease Father   . Hyperlipidemia  Father     Social History:  reports that he has been smoking Cigarettes.  He has a 40 pack-year smoking history. He has never used smokeless tobacco. He reports that he does not drink alcohol or use illicit drugs.  Allergies: No Known Allergies  Medications: I have reviewed the patient's current medications.  Results for orders placed or performed during the hospital encounter of 06/19/14 (from the past 48 hour(s))  Glucose, capillary     Status: Abnormal   Collection Time: 06/25/14 10:29 PM  Result Value Ref Range   Glucose-Capillary 117 (H) 70 - 99 mg/dL  Basic metabolic panel     Status: Abnormal   Collection Time: 06/26/14  5:00 AM  Result Value Ref Range   Sodium 133 (L) 135 - 145 mmol/L   Potassium 6.3 (HH) 3.5 - 5.1 mmol/L    Comment: CRITICAL RESULT CALLED TO, READ BACK BY AND VERIFIED WITH: ZHAO B RN 06/26/14 0741 COSTELLO B REPEATED TO VERIFY    Chloride 91 (L) 96 - 112 mmol/L   CO2 26 19 - 32 mmol/L   Glucose, Bld 81 70 - 99 mg/dL   BUN 76 (H) 6 - 23 mg/dL    Comment: DELTA CHECK NOTED   Creatinine, Ser 10.24 (H) 0.50 - 1.35 mg/dL   Calcium 7.1 (L) 8.4 - 10.5 mg/dL   GFR calc non Af Amer 5 (L) >90 mL/min   GFR calc Af Amer 6 (L) >90 mL/min    Comment: (NOTE) The eGFR has been calculated using the CKD EPI equation. This calculation has not been validated in all clinical situations. eGFR's persistently <90 mL/min signify possible Chronic Kidney Disease.    Anion gap 16 (H) 5 - 15  CBC with Differential/Platelet     Status: Abnormal   Collection Time: 06/26/14  5:00 AM  Result Value Ref Range   WBC 13.6 (H) 4.0 - 10.5 K/uL   RBC 4.33 4.22 - 5.81 MIL/uL   Hemoglobin 13.2 13.0 - 17.0 g/dL   HCT 39.9 39.0 - 52.0 %   MCV 92.1 78.0 - 100.0 fL   MCH 30.5 26.0 - 34.0 pg   MCHC 33.1 30.0 - 36.0 g/dL   RDW 15.3 11.5 - 15.5 %   Platelets 187 150 - 400 K/uL   Neutrophils Relative % 71 43 - 77 %   Lymphocytes Relative 13 12 - 46 %   Monocytes Relative 13 (H) 3 - 12 %    Eosinophils Relative 3 0 - 5 %   Basophils Relative 0 0 - 1 %   Neutro Abs 9.6 (H) 1.7 - 7.7 K/uL   Lymphs Abs 1.8 0.7 - 4.0 K/uL   Monocytes Absolute 1.8 (H) 0.1 - 1.0 K/uL   Eosinophils Absolute 0.4 0.0 - 0.7 K/uL   Basophils Absolute 0.0 0.0 - 0.1 K/uL   Smear Review MORPHOLOGY UNREMARKABLE   Protime-INR     Status: None   Collection Time: 06/26/14 12:12 PM  Result Value Ref Range   Prothrombin Time 14.5 11.6 - 15.2 seconds   INR 1.12 0.00 - 1.49  Glucose, capillary     Status: Abnormal   Collection Time: 06/26/14 12:21 PM  Result Value Ref Range   Glucose-Capillary 110 (H) 70 - 99 mg/dL  Glucose, capillary     Status: Abnormal   Collection Time: 06/26/14  5:07 PM  Result Value Ref Range   Glucose-Capillary 125 (H) 70 - 99 mg/dL  Glucose, capillary     Status: Abnormal   Collection Time: 06/26/14  8:42 PM  Result Value Ref Range   Glucose-Capillary 106 (H) 70 - 99 mg/dL  Protime-INR     Status: None   Collection Time: 06/27/14  6:26 AM  Result Value Ref Range   Prothrombin Time 14.2 11.6 - 15.2 seconds   INR 1.09 0.00 - 3.15  Basic metabolic panel     Status: Abnormal   Collection Time: 06/27/14  6:26 AM  Result Value Ref Range   Sodium 134 (L) 135 - 145 mmol/L   Potassium 5.5 (H) 3.5 - 5.1 mmol/L   Chloride 93 (L) 96 - 112 mmol/L   CO2 25 19 - 32 mmol/L   Glucose, Bld 75 70 -  99 mg/dL   BUN 54 (H) 6 - 23 mg/dL   Creatinine, Ser 8.39 (H) 0.50 - 1.35 mg/dL   Calcium 8.2 (L) 8.4 - 10.5 mg/dL   GFR calc non Af Amer 6 (L) >90 mL/min   GFR calc Af Amer 7 (L) >90 mL/min    Comment: (NOTE) The eGFR has been calculated using the CKD EPI equation. This calculation has not been validated in all clinical situations. eGFR's persistently <90 mL/min signify possible Chronic Kidney Disease.    Anion gap 16 (H) 5 - 15  Glucose, capillary     Status: None   Collection Time: 06/27/14  9:53 AM  Result Value Ref Range   Glucose-Capillary 77 70 - 99 mg/dL   Comment 1 Notify RN     Comment 2 Documented in Char     Mr Lumbar Spine Wo Contrast  06/27/2014   CLINICAL DATA:  59 year old male with end-stage renal disease on hemodialysis. History diabetes, hypertension and chronic back pain.  EXAM: MRI LUMBAR SPINE WITHOUT CONTRAST  TECHNIQUE: Multiplanar, multisequence MR imaging of the lumbar spine was performed. No intravenous contrast was administered.  COMPARISON:  None.  FINDINGS: The vertebral bodies of the lumbar spine are normal in size. The vertebral bodies of the lumbar spine are normal in alignment. There is diffuse heterogeneous marrow signal throughout the lumbar spine with areas of low signal with the overall appearance compatible with the patient's history of end-stage renal disease and renal osteodystrophy. There is degenerative disc disease with disc height loss at L3-4 and L5-S1. There are Modic endplate changes at H4-1 and L5-S1.  There is The spinal cord is normal in signal and contour. The cord terminates normally at L1 . The nerve roots of the cauda equina and the filum terminale are normal.  The visualized portions of the SI joints are unremarkable.  Multi cystic, atrophic bilateral kidneys.  T12-L1: No significant disc bulge. No evidence of neural foraminal stenosis. No central canal stenosis.  L1-L2: No significant disc bulge. No evidence of neural foraminal stenosis. No central canal stenosis.  L2-L3: Mild bilateral facet arthropathy. Mild spinal stenosis. No evidence of neural foraminal stenosis.  L3-L4: Large right paracentral and foraminal disc protrusion with mass effect on the right intraspinal L4 nerve root and the exiting L3 nerve root. There is cranial migration of disc material along the right posterior paracentral aspect of the L3 vertebral body. There is severe right foraminal stenosis. There is moderate left foraminal stenosis. There is severe bilateral facet arthropathy with ligamentum flavum infolding resulting in severe spinal stenosis.  L4-L5: Mild  broad-based disc bulge. Mild bilateral facet arthropathy. Mild spinal stenosis. Mild bilateral foraminal stenosis.  L5-S1: Mild broad-based disc bulge. Mild bilateral facet arthropathy. Moderate bilateral foraminal stenosis. No central canal stenosis.  IMPRESSION: 1. At L3-4 there is a large right paracentral and foraminal disc protrusion with mass effect on the right intraspinal L4 nerve root and the exiting L3 nerve root. There is cranial migration of disc material along the right posterior paracentral aspect of the L3 vertebral body. There is severe right foraminal stenosis. There is moderate left foraminal stenosis. There is severe bilateral facet arthropathy with ligamentum flavum infolding resulting in severe spinal stenosis. 2. Multilevel lumbar spine spondylosis as detailed above.   Electronically Signed   By: Kathreen Devoid   On: 06/27/2014 10:10    Review of Systems  Constitutional: Negative.   Cardiovascular: Positive for claudication.  Musculoskeletal: Positive for myalgias, back pain and joint  pain.  Neurological: Positive for tingling and sensory change.   Blood pressure 120/38, pulse 78, temperature 97.6 F (36.4 C), temperature source Oral, resp. rate 18, height _0  (1.803 m), weight 119 kg (262 lb 5.6 oz), SpO2 98 %. Physical Exam  Constitutional: He is oriented to person, place, and time. He appears well-developed and well-nourished.  Eyes: Pupils are equal, round, and reactive to light.  Neck: Normal range of motion.  Neurological: He is alert and oriented to person, place, and time. He has normal strength. GCS eye subscore is 4. GCS verbal subscore is 5. GCS motor subscore is 6.  Strength is 5 out of 5 in his iliopsoas, quads, handshake, gases, and tibialis, EHL.    Assessment/Plan: 59 year old gentleman with an L3-4 disc herniation with a free fragment migrating cephalad causing compression of the right L3 and L4 nerve root. Clinically patient has a normal neurologic exam  with no focal motor deficits and some very mild sensory deficits. Due to patient's multiple medical comorbidities is end-stage renal disease and eczema high risk for bleeding complications in wound healing. In addition his diabetes and ptotic history also make him a significant risk for wound healing in general anesthesia. I understand cardiology had declared him a low risk for general anesthesia however I think the greater issue is his bleeding and wound healing issues. Patient also may have some systemic infection related to some open ulcers that are currently in the process of being worked up. In the setting of a normal neurologic exam and in this patient with multiple medical comorbidities I would continue to aggressively pursue nonoperative care. If infection were ruled out and the patient with did not of the contraindications to steroids could consider epidural steroid injection or alternatively continued pain management with physical therapy and time. I think it's okay with the patient be transferred to a skilled nursing facility or inpatient rehabilitation for ongoing physical therapy was scheduled follow-up in approximately 2-3 weeks as an outpatient.  Christeen Lai P 06/27/2014, 4:33 PM

## 2014-06-27 NOTE — Progress Notes (Signed)
PT Cancellation Note  Patient Details Name: [name] MRN: [id number] DOB: 03-Aug-1955   Cancelled Treatment:    Reason Eval/Treat Not Completed: Patient at procedure or test/unavailable. Pt off unit for procedure in AM and is now in HD for the PM. Will continue to follow.    Conni Slipper 06/27/2014, 12:15 PM   Conni Slipper, PT, DPT Acute Rehabilitation Services Pager: [phone]

## 2014-06-27 NOTE — Procedures (Signed)
I was present at this session.  I have reviewed the session itself and made appropriate changes.  Using L groin avf, has area ? Infx.  Access press ok.  bp ok.    Kiyani Jernigan L 3/2/201611:33 AM

## 2014-06-27 NOTE — Anesthesia Procedure Notes (Signed)
Procedure Name: LMA Insertion Date/Time: 06/27/2014 8:32 AM Performed by: Coralee Rud Pre-anesthesia Checklist: Patient identified, Emergency Drugs available, Suction available and Patient being monitored Patient Re-evaluated:Patient Re-evaluated prior to inductionOxygen Delivery Method: Circle system utilized Preoxygenation: Pre-oxygenation with 100% oxygen Intubation Type: IV induction Ventilation: Mask ventilation without difficulty LMA: LMA inserted LMA Size: 5.0 Number of attempts: 1 Placement Confirmation: positive ETCO2 Tube secured with: Tape Dental Injury: Teeth and Oropharynx as per pre-operative assessment

## 2014-06-27 NOTE — Progress Notes (Addendum)
Inpatient Rehabilitation  Pt. Awaits MRI results and plan of care .  [name] [phone]) will follow pt. In my absence tomorrow.  Please call if questions.  Weldon Picking PT Inpatient Rehab Admissions Coordinator Cell [phone] Office [phone]

## 2014-06-27 NOTE — Progress Notes (Signed)
Chaplain responded to page that pt requested prayer  Before 7 am procedure. Pt also shared that his life is "a mess" right now and he's really struggling in general. Chaplain provided prayer. Will inform unit chaplain for follow-up.  Wille Glaser 06/27/2014 7:11 AM

## 2014-06-27 NOTE — Progress Notes (Signed)
Subjective: Interval History: has complaints LBP anxious to hear about MRI.  Objective: Vital signs in last 24 hours: Temp:  [97.7 F (36.5 C)-98.7 F (37.1 C)] 97.7 F (36.5 C) (03/02 1015) Pulse Rate:  [70-85] 78 (03/02 1015) Resp:  [12-18] 15 (03/02 1010) BP: (82-113)/(51-73) 105/51 mmHg (03/02 1015) SpO2:  [93 %-100 %] 95 % (03/02 1015) Weight:  [120.4 kg (265 lb 6.9 oz)] 120.4 kg (265 lb 6.9 oz) (03/01 2047) Weight change: -0.1 kg (-3.5 oz)  Intake/Output from previous day: 03/01 0701 - 03/02 0700 In: 880 [P.O.:880] Out: 4000  Intake/Output this shift: Total I/O In: 300 [I.V.:300] Out: -   General appearance: alert and morbidly obese Resp: diminished breath sounds bilaterally Cardio: S1, S2 normal and systolic murmur: holosystolic 2/6, blowing at apex GI: obese, pos bs, soft Extremities: AVG L groin,  On Medial aspect distally adj to AVG area is open and pus expressed  Lab Results:  Recent Labs  06/25/14 0419 06/26/14 0500  WBC 11.6* 13.6*  HGB 14.6 13.2  HCT 44.2 39.9  PLT 170 187   BMET:  Recent Labs  06/26/14 0500 06/27/14 0626  NA 133* 134*  K 6.3* 5.5*  CL 91* 93*  CO2 26 25  GLUCOSE 81 75  BUN 76* 54*  CREATININE 10.24* 8.39*  CALCIUM 7.1* 8.2*   No results for input(s): PTH in the last 72 hours. Iron Studies: No results for input(s): IRON, TIBC, TRANSFERRIN, FERRITIN in the last 72 hours.  Studies/Results: Mr Lumbar Spine Wo Contrast  06/27/2014   CLINICAL DATA:  59 year old male with end-stage renal disease on hemodialysis. History diabetes, hypertension and chronic back pain.  EXAM: MRI LUMBAR SPINE WITHOUT CONTRAST  TECHNIQUE: Multiplanar, multisequence MR imaging of the lumbar spine was performed. No intravenous contrast was administered.  COMPARISON:  None.  FINDINGS: The vertebral bodies of the lumbar spine are normal in size. The vertebral bodies of the lumbar spine are normal in alignment. There is diffuse heterogeneous marrow signal  throughout the lumbar spine with areas of low signal with the overall appearance compatible with the patient's history of end-stage renal disease and renal osteodystrophy. There is degenerative disc disease with disc height loss at L3-4 and L5-S1. There are Modic endplate changes at L3-4 and L5-S1.  There is The spinal cord is normal in signal and contour. The cord terminates normally at L1 . The nerve roots of the cauda equina and the filum terminale are normal.  The visualized portions of the SI joints are unremarkable.  Multi cystic, atrophic bilateral kidneys.  T12-L1: No significant disc bulge. No evidence of neural foraminal stenosis. No central canal stenosis.  L1-L2: No significant disc bulge. No evidence of neural foraminal stenosis. No central canal stenosis.  L2-L3: Mild bilateral facet arthropathy. Mild spinal stenosis. No evidence of neural foraminal stenosis.  L3-L4: Large right paracentral and foraminal disc protrusion with mass effect on the right intraspinal L4 nerve root and the exiting L3 nerve root. There is cranial migration of disc material along the right posterior paracentral aspect of the L3 vertebral body. There is severe right foraminal stenosis. There is moderate left foraminal stenosis. There is severe bilateral facet arthropathy with ligamentum flavum infolding resulting in severe spinal stenosis.  L4-L5: Mild broad-based disc bulge. Mild bilateral facet arthropathy. Mild spinal stenosis. Mild bilateral foraminal stenosis.  L5-S1: Mild broad-based disc bulge. Mild bilateral facet arthropathy. Moderate bilateral foraminal stenosis. No central canal stenosis.  IMPRESSION: 1. At L3-4 there is a large right  paracentral and foraminal disc protrusion with mass effect on the right intraspinal L4 nerve root and the exiting L3 nerve root. There is cranial migration of disc material along the right posterior paracentral aspect of the L3 vertebral body. There is severe right foraminal stenosis.  There is moderate left foraminal stenosis. There is severe bilateral facet arthropathy with ligamentum flavum infolding resulting in severe spinal stenosis. 2. Multilevel lumbar spine spondylosis as detailed above.   Electronically Signed   By: Elige Ko   On: 06/27/2014 10:10    I have reviewed the patient's current medications.  Assessment/Plan: 1 ESRD for HD 2 Anemia epo 3 ?Abscess adj to AVG, culture and get VVS to see. 4 HTN not an issue 5 Obesity 6 LBP MRI pending.  Need to r/o infx or area amenable to surgery 7 HPTH actually hypo after surgery 8 CAD P HD, epo, culture lesion.      LOS: 7 days   Greysen Swanton L 06/27/2014,11:24 AM

## 2014-06-27 NOTE — Progress Notes (Signed)
Subjective: Pt seen in HD today. Complaining of rt leg pain that he mainly feels. Having generalized cramping. Informed patient of lower lumbar spinal stenosis seen on MRI this morning.   Objective: Vital signs in last 24 hours: Filed Vitals:   06/26/14 1052 06/26/14 1708 06/26/14 2047 06/27/14 0528  BP: 116/64 92/51 111/73 102/57  Pulse: 80 72 79 70  Temp: 98.2 F (36.8 C) 98.7 F (37.1 C) 98.7 F (37.1 C) 98.1 F (36.7 C)  TempSrc: Oral Oral Oral Oral  Resp: 18 17 18 17   Height:      Weight: 257 lb 15 oz (117 kg)  265 lb 6.9 oz (120.4 kg)   SpO2: 100% 95% 93% 97%   Weight change: -3.5 oz (-0.1 kg)  Intake/Output Summary (Last 24 hours) at 06/27/14 2703 Last data filed at 06/27/14 5009  Gross per 24 hour  Intake    880 ml  Output   4000 ml  Net  -3120 ml   General: laying on back in HD Lungs: clear to anterior auscultation Cardiac: RRR, no murmurs GI: soft, active bowel sounds Neuro: CN II-XII grossly intact Ext:  Open 1 cm wound near AV graft on anterior medial aspect of proximal left thigh.    Lab Results: Basic Metabolic Panel:  Recent Labs Lab 06/23/14 1215  06/24/14 2007 06/25/14 0419 06/26/14 0500  NA 134*  < > 129* 135 133*  K 4.4  < > 6.6* 4.6 6.3*  CL 94*  < > 91* 95* 91*  CO2 27  < > 20 26 26   GLUCOSE 89  < > 84 90 81  BUN 48*  < > 88* 45* 76*  CREATININE 8.73*  < > 11.50* 7.84* 10.24*  CALCIUM 7.4*  < > 6.4* 8.2* 7.1*  PHOS 5.8*  --  9.8*  --   --   < > = values in this interval not displayed. Liver Function Tests:  Recent Labs Lab 06/23/14 1215 06/24/14 2007  ALBUMIN 3.1* 2.9*   CBC:  Recent Labs Lab 06/25/14 0419 06/26/14 0500  WBC 11.6* 13.6*  NEUTROABS 8.4* 9.6*  HGB 14.6 13.2  HCT 44.2 39.9  MCV 92.9 92.1  PLT 170 187   CBG:  Recent Labs Lab 06/25/14 0822 06/25/14 1215 06/25/14 2229 06/26/14 1221 06/26/14 1707 06/26/14 2042  GLUCAP 114* 92 117* 110* 125* 106*    Studies/Results: No results  found. Medications: I have reviewed the patient's current medications. Scheduled Meds: . [MAR Hold] aspirin EC  81 mg Oral Daily  . [MAR Hold] atorvastatin  80 mg Oral q1800  . [MAR Hold] calcium carbonate  3 tablet Oral TID  . [MAR Hold] fentaNYL  50 mcg Transdermal Q72H  . [MAR Hold] gabapentin  100 mg Oral BID  . [MAR Hold] heparin subcutaneous  5,000 Units Subcutaneous 3 times per day  . [MAR Hold] lanthanum  1,000 mg Oral TID WC  . [MAR Hold] metoprolol tartrate  25 mg Oral BID  . [MAR Hold] multivitamin  1 tablet Oral QHS  . [MAR Hold] pantoprazole  80 mg Oral Q1200  . [MAR Hold] sodium chloride  3 mL Intravenous Q12H  . [MAR Hold] sodium chloride  3 mL Intravenous Q12H  . [MAR Hold] ticagrelor  90 mg Oral BID   Continuous Infusions:  PRN Meds:.[MAR Hold] oxyCODONE Assessment/Plan: Principal Problem:   Right leg pain Active Problems:   End-stage renal disease on hemodialysis   HTN (hypertension)   Radicular leg pain   Right-sided  low back pain with right-sided sciatica   Acute superficial venous thrombosis of lower extremity   Hyperkalemia   Chronic lower back pain with worsening of rt lower back and rt leg pain -- likely 2/2 L4 spinal stenosis seen on MRI of lumbar spine this morning. Consulted neurosurgery who will see patient this evening. Appreciate their recommendations.  -  oxycodone  q4h prn and fentanyl patch q72 hours - CIR following, pt also has SNF bed available when patient is stable for discharge  Proximal medial thigh wound-- near site of previous IV access. 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  - wound culture sent - VVS to see patient   Superficial Rt LE thrombus-- initially started coumadin this admission which was then stopped after vascular surgery saw patient. Will order LE dopplers for patient in 2 weeks to ensure no propagation of thrombus. If it has not progressed will tx conservatively w/ warm compresses/ice/  NSAIDs.   ESRD-- on MTWF schedule - continue home TUMS and renal MVI - nephrology added forenol this admission  Hx of STEMI--in 07/2013. cath revealed 3 vessel disease but he was not a candidate for emergent CABG. He had a DES to the LAD. He had an occluded RCA that could not be fixed.  - holding home nitro  - continue home asa  and brilinta.   HTN - continue home lopressor  BID   HLD - continue home lipitor   Anxiety - holding home trazadone   DM with peripheral neuropathy-- diabetes is diet controlled. HbA1c 6.3 on 05/2010 - CBGs ACHS - cont home gabapentin  BID  GERD - cont home nexium  FEN - renal/carb mod diet  DVT ppx-- brilinta  Dispo:  Anticipated discharge date deferred at this time until medical conditions improved and pt has appropriate dispo placement, CIR vs SNF  The patient does have a current PCP Dyke Maes, MD) and does not need an [facility] hospital follow-up appointment after discharge.  The patient does not have transportation limitations that hinder transportation to clinic appointments. .Services Needed at time of discharge: Y = Yes, Blank = No PT:  CIR, 24 hr supervision/assistance   OT:   RN:   Equipment:   Other:     LOS: 7 days   Gara Kroner, MD  Pager # [phone] 06/27/2014, 1:12 PM

## 2014-06-27 NOTE — Transfer of Care (Signed)
Immediate Anesthesia Transfer of Care Note  Patient: [name]  Procedure(s) Performed: Procedure(s): MRI LUMBER WITHOUT CONTRAST (N/A)  Patient Location: PACU  Anesthesia Type:General  Level of Consciousness: awake, alert  and oriented  Airway & Oxygen Therapy: Patient Spontanous Breathing and Patient connected to face mask oxygen  Post-op Assessment: Report given to RN, Post -op Vital signs reviewed and stable and Patient moving all extremities  Post vital signs: Reviewed and stable  Last Vitals:  Filed Vitals:   06/27/14 0528  BP: 102/57  Pulse: 70  Temp: 36.7 C  Resp: 17    Complications: No apparent anesthesia complications

## 2014-06-27 NOTE — Anesthesia Preprocedure Evaluation (Addendum)
Anesthesia Evaluation  Patient identified by MRN, date of birth, ID band Patient awake  General Assessment Comment:K+  6.3  3/1 16  Reviewed: Allergy & Precautions, NPO status , Patient's Chart, lab work & pertinent test results  History of Anesthesia Complications (+) PONV  Airway Mallampati: II   Neck ROM: full    Dental  (+) Edentulous Lower, Edentulous Upper   Pulmonary Current Smoker,  breath sounds clear to auscultation  Pulmonary exam normal       Cardiovascular hypertension, + CAD, + Past MI, + Cardiac Stents, + Peripheral Vascular Disease and +CHF Rhythm:Regular  Pt had recent cardiac clearance for this MRI and is reported to be low CV risk with general anesthesia.   Neuro/Psych  Headaches, Anxiety Weak bilaterally from waist down  Neuromuscular disease    GI/Hepatic GERD-  ,(+) Hepatitis -, B  Endo/Other  diabetes, Well Controlled, Type 2  Renal/GU ESRF and DialysisRenal disease     Musculoskeletal  (+) Arthritis -,   Abdominal (+)  Abdomen: soft.    Peds  Hematology negative hematology ROS (+)   Anesthesia Other Findings   Reproductive/Obstetrics                            Anesthesia Physical Anesthesia Plan  ASA: III  Anesthesia Plan: General   Post-op Pain Management:    Induction: Intravenous  Airway Management Planned: Oral ETT  Additional Equipment:   Intra-op Plan:   Post-operative Plan: Extubation in OR  Informed Consent: I have reviewed the patients History and Physical, chart, labs and discussed the procedure including the risks, benefits and alternatives for the proposed anesthesia with the patient or authorized representative who has indicated his/her understanding and acceptance.   Dental advisory given  Plan Discussed with: CRNA and Anesthesiologist  Anesthesia Plan Comments:         Anesthesia Quick Evaluation

## 2014-06-28 ENCOUNTER — Encounter (HOSPITAL_COMMUNITY): Payer: Self-pay | Admitting: Radiology

## 2014-06-28 DIAGNOSIS — L03116 Cellulitis of left lower limb: Secondary | ICD-10-CM | POA: Insufficient documentation

## 2014-06-28 DIAGNOSIS — T82868A Thrombosis of vascular prosthetic devices, implants and grafts, initial encounter: Secondary | ICD-10-CM

## 2014-06-28 DIAGNOSIS — S70922S Unspecified superficial injury of left thigh, sequela: Secondary | ICD-10-CM

## 2014-06-28 DIAGNOSIS — X58XXXS Exposure to other specified factors, sequela: Secondary | ICD-10-CM

## 2014-06-28 DIAGNOSIS — M48061 Spinal stenosis, lumbar region without neurogenic claudication: Secondary | ICD-10-CM | POA: Insufficient documentation

## 2014-06-28 LAB — PROTIME-INR
INR: 1.13 (ref 0.00–1.49)
Prothrombin Time: 14.6 seconds (ref 11.6–15.2)

## 2014-06-28 LAB — BASIC METABOLIC PANEL
Anion gap: 12 (ref 5–15)
BUN: 39 mg/dL — AB (ref 6–23)
CALCIUM: 8.5 mg/dL (ref 8.4–10.5)
CO2: 27 mmol/L (ref 19–32)
CREATININE: 7.11 mg/dL — AB (ref 0.50–1.35)
Chloride: 94 mmol/L — ABNORMAL LOW (ref 96–112)
GFR calc Af Amer: 9 mL/min — ABNORMAL LOW (ref >90)
GFR, EST NON AFRICAN AMERICAN: 8 mL/min — AB (ref >90)
GLUCOSE: 94 mg/dL (ref 70–99)
Potassium: 4.9 mmol/L (ref 3.5–5.1)
Sodium: 133 mmol/L — ABNORMAL LOW (ref 135–145)

## 2014-06-28 LAB — GLUCOSE, CAPILLARY
Glucose-Capillary: 94 mg/dL (ref 70–99)
Glucose-Capillary: 98 mg/dL (ref 70–99)

## 2014-06-28 MED ORDER — DOXYCYCLINE HYCLATE 100 MG PO TABS: 100.0000 mg | ORAL_TABLET | Freq: Two times a day (BID) | ORAL | Status: DC

## 2014-06-28 MED ORDER — OXYCODONE HCL 5 MG PO TABS: 5.0000 mg | ORAL_TABLET | ORAL | Status: DC | PRN

## 2014-06-28 MED ORDER — FENTANYL 50 MCG/HR TD PT72: 50.0000 ug | MEDICATED_PATCH | TRANSDERMAL | Status: DC

## 2014-06-28 NOTE — Progress Notes (Signed)
  Date: 06/28/2014  Patient name: [name]  Medical record number: [id number]  Date of birth: 1955-10-12   I have seen and evaluated [name] and discussed their care with the Residency Team. Including Dr. Mikey Bussing and Dr. Danella Penton  Assessment and Plan: I have seen and evaluated the patient as outlined above. I agree with the formulated Assessment and Plan as detailed in the residents' admission note, with the following changes:   [name] is been found to have significant spinal stenosis but not felt to be a good surgical candidate by neurosurgery.  He was found also to have an area of purulent discharge near his thigh graft.\   I examined this is morning: Seen a picture 06/28/2014:    He states he has been receiving antibiotics with hemodialysis for this. Nephrology had expressed her on some the wound and sent this for culture which has not yielded an organism though again the patient had been on antibiotics recently. VVS and  Dr. Durwin Nora has thoroughly examine the wound and probed with a Q-tip and feels that it is likely a superficial infection.  I would recommend placing the patient oral doxycycline 100 mg twice daily for the next 10 days and continue local wound care. Certainly if this fails to resolve or worsens he will need more aggressive imaging and or trip to the OR for removal of venous portion of the graft.   He will need close followup with VVS post dc from the hospital.   [name] Hiss, MD 3/3/20161:51 PM

## 2014-06-28 NOTE — Progress Notes (Addendum)
   [facility]  HPI: 59 y/o male was last seen by Dr. Darrick Penna on 06/22/2014 for right leg pain and weakness.  The patient has had multilple thigh dialysis grafts and all leg grafts have been abandoned on the right side. Left thigh graft revision was performed 08/08/2013.    08/07/2013 a duplex study showed: Femoral to greater saphenous loop graft is patent. There is a 3.8cm simple anechoic lesion of the groin in the region of the outflow segment of the graft, which does not appear to connect with the graft. This is suggestive of a possible seroma, versus hematoma, versus unknown etiology. There is perigraft fluid visualized in the proximal to mid graft, in the region of the patient's wound.  He states he has had medial swelling around the graft on/off and it will drain then heals up again.  He states he has been on antibiotics with this problem in the past at HD.  He reports no fever or chills.   Objective 116/49 65 98.8 F (37.1 C) (Oral) 18 99%  Intake/Output Summary (Last 24 hours) at 06/28/14 0946 Last data filed at 06/28/14 0600  Gross per 24 hour  Intake    720 ml  Output    690 ml  Net     30 ml   Left medial thigh great old stick site ulcer with SS blood drainage, no expressible purulents. Palpable thrill in graft, currently using the graft without difficulty at HD.  Assessment/Planning: Medial thigh AV graft wound Pending cultures, WBC 06/26/2014 13.6, TM 98.8 If the graft is infected it may have to be removed and a diatek placed.   We will follow results of cultures  [facility] 121 06/28/2014 9:46 AM  Agree with above. The patient has a 5 mm wound adjacent to his left thigh graft at approximately the 8:00 position. I attempted to probe this with a Q-tip and this appears to be very superficial. Certainly there is concern for infection, however I would recommend local wound care and we will follow this closely. The only surgical option  would be to replace the venous half of the graft essentially and then excise the old segment. The graft is irregular in multiple areas, thus I would probably have to replace the entire venous half of the graft. We will follow.  Waverly Ferrari, MD, FACS Beeper [phone] Office: [phone]

## 2014-06-28 NOTE — Progress Notes (Signed)
Loetta Rough to be D/C'd Skilled nursing facility per MD order.  Called report to facility RN.     Medication List    STOP taking these medications        cephALEXin 500 MG capsule  Commonly known as:  KEFLEX     metoprolol tartrate 25 MG tablet  Commonly known as:  LOPRESSOR      TAKE these medications        aspirin EC 81 MG tablet  Take 1 tablet (81 mg total) by mouth daily.     atorvastatin 80 MG tablet  Commonly known as:  LIPITOR  Take 1 tablet (80 mg total) by mouth daily at 6 PM.     b complex-vitamin c-folic acid 0.8 MG Tabs tablet  Take 0.8 mg by mouth at bedtime.     calcium carbonate 500 MG chewable tablet  Commonly known as:  TUMS - dosed in mg elemental calcium  Chew 3 tablets by mouth 3 (three) times daily.     colchicine 0.6 MG tablet  Take 0.6 mg by mouth 2 (two) times daily as needed. For gout     doxycycline 100 MG tablet  Commonly known as:  VIBRA-TABS  Take 1 tablet (100 mg total) by mouth 2 (two) times daily.     esomeprazole 40 MG capsule  Commonly known as:  NEXIUM  Take 40 mg by mouth every evening.     fentaNYL 50 MCG/HR  Commonly known as:  DURAGESIC - dosed mcg/hr  Place 1 patch (50 mcg total) onto the skin every 3 (three) days.     gabapentin 100 MG capsule  Commonly known as:  NEURONTIN  Take 1 capsule by mouth 2 (two) times daily as needed.     latanoprost 0.005 % ophthalmic solution  Commonly known as:  XALATAN  Place 1 drop into both eyes at bedtime.     loperamide 2 MG capsule  Commonly known as:  IMODIUM  Take 4 mg by mouth as needed for diarrhea or loose stools.     naproxen sodium 220 MG tablet  Commonly known as:  ANAPROX  Take 440 mg by mouth 2 (two) times daily with a meal.     nitroGLYCERIN 0.4 MG SL tablet  Commonly known as:  NITROSTAT  Place 1 tablet (0.4 mg total) under the tongue every 5 (five) minutes as needed for chest pain.     oxyCODONE 5 MG immediate release tablet  Commonly known as:  Oxy  IR/ROXICODONE  Take 1-2 tablets (5-10 mg total) by mouth every 4 (four) hours as needed for moderate pain.     prasugrel 10 MG Tabs tablet  Commonly known as:  EFFIENT  Take 1 tablet (10 mg total) by mouth daily.     sodium bicarbonate 650 MG tablet  Take 650 mg by mouth 2 (two) times daily.     sulindac 200 MG tablet  Commonly known as:  CLINORIL  Take 200 mg by mouth 2 (two) times daily.     ticagrelor 90 MG Tabs tablet  Commonly known as:  BRILINTA  Take 90 mg by mouth 2 (two) times daily.     traZODone 50 MG tablet  Commonly known as:  DESYREL  Take 50 mg by mouth at bedtime as needed for sleep.        Filed Vitals:   06/28/14 0945  BP: 121/64  Pulse: 70  Temp: 98.4 F (36.9 C)  Resp: 18    Skin clean,  dry and intact without evidence of skin break down, no evidence of skin tears noted. IV catheter discontinued intact. Site without signs and symptoms of complications. Dressing and pressure applied. Pt denies pain at this time. No complaints noted. Patient escorted via [employer] [facility]  Jewels Langone A 06/28/2014 5:38 PM

## 2014-06-28 NOTE — Consult Note (Signed)
WOC wound consult note Reason for Consult: Consult requested for left thigh wound.  VVS is following for assessment and plan of care; refer to progress notes on 3/3. Wound type: Full thickness Measurement: .3X.3X.2cm Wound bed: dark red, no fluctuance when probed. Drainage (amount, consistency, odor) Small amt yellow drainage, no odor Periwound: Graft located nearby and palpable under skin. Dressing procedure/placement/frequency: There is no depth to pack. VVS applied folded gauze when they were in earlier. Continue present plan of care and change daily or PRN if saturated from drainage.  Please refer to VVS service for further questions. Please re-consult if further assistance is needed.  Thank-you,  Cammie Mcgee MSN, RN, CWOCN, [place], CNS [phone]

## 2014-06-28 NOTE — Progress Notes (Signed)
Subjective: Pt sleeping in bed, complaining of muscle cramping upon awakening. States he does not want to go to CIR b/c he does not think he can work with PT that often, prefers to go to SNF.   Objective: Vital signs in last 24 hours: Filed Vitals:   06/27/14 2041 06/28/14 0210 06/28/14 0244 06/28/14 0449  BP: 98/56 116/62 114/59 116/49  Pulse: 82 76 72 65  Temp: 99.3 F (37.4 C) 98.6 F (37 C)  98.8 F (37.1 C)  TempSrc: Oral Oral  Oral  Resp: 18 16  18   Height:      Weight:      SpO2: 98% 95% 98% 99%   Weight change: 3 lb 12 oz (1.7 kg)  Intake/Output Summary (Last 24 hours) at 06/28/14 0912 Last data filed at 06/28/14 0600  Gross per 24 hour  Intake   1020 ml  Output    690 ml  Net    330 ml   General: NAD HEENT: moist mucous membranes Lungs: CTAB, no wheezes Cardiac: RRR, no murmurs GI: soft, active bowel sounds, non TTP Neuro: CN II-XII grossly intact MSK: on left leg straight leg test pt had pain on posterior thigh, on rt straight leg test pt reported back pain.    Lab Results: Basic Metabolic Panel:  Recent Labs Lab 06/23/14 1215  06/24/14 2007  06/27/14 0626 06/28/14 0745  NA 134*  < > 129*  < > 134* 133*  K 4.4  < > 6.6*  < > 5.5* 4.9  CL 94*  < > 91*  < > 93* 94*  CO2 27  < > 20  < > 25 27  GLUCOSE 89  < > 84  < > 75 94  BUN 48*  < > 88*  < > 54* 39*  CREATININE 8.73*  < > 11.50*  < > 8.39* 7.11*  CALCIUM 7.4*  < > 6.4*  < > 8.2* 8.5  PHOS 5.8*  --  9.8*  --   --   --   < > = values in this interval not displayed. Liver Function Tests:  Recent Labs Lab 06/23/14 1215 06/24/14 2007  ALBUMIN 3.1* 2.9*   CBC:  Recent Labs Lab 06/25/14 0419 06/26/14 0500  WBC 11.6* 13.6*  NEUTROABS 8.4* 9.6*  HGB 14.6 13.2  HCT 44.2 39.9  MCV 92.9 92.1  PLT 170 187   CBG:  Recent Labs Lab 06/26/14 1707 06/26/14 2042 06/27/14 0953 06/27/14 1655 06/27/14 2241 06/28/14 0806  GLUCAP 125* 106* 77 113* 110* 94    Studies/Results: Mr Lumbar  Spine Wo Contrast  06/27/2014   CLINICAL DATA:  59 year old male with end-stage renal disease on hemodialysis. History diabetes, hypertension and chronic back pain.  EXAM: MRI LUMBAR SPINE WITHOUT CONTRAST  TECHNIQUE: Multiplanar, multisequence MR imaging of the lumbar spine was performed. No intravenous contrast was administered.  COMPARISON:  None.  FINDINGS: The vertebral bodies of the lumbar spine are normal in size. The vertebral bodies of the lumbar spine are normal in alignment. There is diffuse heterogeneous marrow signal throughout the lumbar spine with areas of low signal with the overall appearance compatible with the patient's history of end-stage renal disease and renal osteodystrophy. There is degenerative disc disease with disc height loss at L3-4 and L5-S1. There are Modic endplate changes at L3-4 and L5-S1.  There is The spinal cord is normal in signal and contour. The cord terminates normally at L1 . The nerve roots of the cauda equina  and the filum terminale are normal.  The visualized portions of the SI joints are unremarkable.  Multi cystic, atrophic bilateral kidneys.  T12-L1: No significant disc bulge. No evidence of neural foraminal stenosis. No central canal stenosis.  L1-L2: No significant disc bulge. No evidence of neural foraminal stenosis. No central canal stenosis.  L2-L3: Mild bilateral facet arthropathy. Mild spinal stenosis. No evidence of neural foraminal stenosis.  L3-L4: Large right paracentral and foraminal disc protrusion with mass effect on the right intraspinal L4 nerve root and the exiting L3 nerve root. There is cranial migration of disc material along the right posterior paracentral aspect of the L3 vertebral body. There is severe right foraminal stenosis. There is moderate left foraminal stenosis. There is severe bilateral facet arthropathy with ligamentum flavum infolding resulting in severe spinal stenosis.  L4-L5: Mild broad-based disc bulge. Mild bilateral facet  arthropathy. Mild spinal stenosis. Mild bilateral foraminal stenosis.  L5-S1: Mild broad-based disc bulge. Mild bilateral facet arthropathy. Moderate bilateral foraminal stenosis. No central canal stenosis.  IMPRESSION: 1. At L3-4 there is a large right paracentral and foraminal disc protrusion with mass effect on the right intraspinal L4 nerve root and the exiting L3 nerve root. There is cranial migration of disc material along the right posterior paracentral aspect of the L3 vertebral body. There is severe right foraminal stenosis. There is moderate left foraminal stenosis. There is severe bilateral facet arthropathy with ligamentum flavum infolding resulting in severe spinal stenosis. 2. Multilevel lumbar spine spondylosis as detailed above.   Electronically Signed   By: Elige Ko   On: 06/27/2014 10:10   Medications: I have reviewed the patient's current medications. Scheduled Meds: . aspirin EC  81 mg Oral Daily  . atorvastatin  80 mg Oral q1800  . calcium carbonate  3 tablet Oral TID  . fentaNYL  50 mcg Transdermal Q72H  . gabapentin  100 mg Oral BID  . heparin subcutaneous  5,000 Units Subcutaneous 3 times per day  . lanthanum  1,000 mg Oral TID WC  . metoprolol tartrate  25 mg Oral BID  . multivitamin  1 tablet Oral QHS  . pantoprazole  80 mg Oral Q1200  . sodium chloride  3 mL Intravenous Q12H  . sodium chloride  3 mL Intravenous Q12H  . ticagrelor  90 mg Oral BID   Continuous Infusions:  PRN Meds:.oxyCODONE Assessment/Plan: Principal Problem:   Right leg pain Active Problems:   End-stage renal disease on hemodialysis   HTN (hypertension)   Radicular leg pain   Right-sided low back pain with right-sided sciatica   Acute superficial venous thrombosis of lower extremity   Hyperkalemia   Proximal medial thigh wound-- near site of previous IV access. 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  - wound gram stain positive for abundant WBCs, predominantly  PMNs, no organisms or squamous epi cells - wound culture negative, report pending - VVS saw patient this morning, will follow along w/ wound culture results. If infected will replace graft and place diatek cath  - can consider CT w/ contrast to further investigate for abscess, will talk to nephro regarding this or can try MRI of left thigh but may need anesthesia again.   Chronic lower back pain with worsening of rt lower back and rt leg pain -- likely 2/2 L4 spinal stenosis seen on MRI of lumbar spine this morning.  - neurosurgery saw patient yesterday, state pt is high risk for bleeding complications and wound healing. Prefers  medical management due to multiple co morbidities. Once infxn ruled out in regards to thigh wound noted below can consider epidural steroid injections or pain management with PT. Pt stable for transfer to SNF and can f/u with neurosx in 2-3 weeks as outpatient -  oxycodone  q4h prn and fentanyl patch q72 hours - CIR following, pt also has SNF bed available when patient is stable for discharge  Superficial Rt LE thrombus-- initially started coumadin this admission which was then stopped after vascular surgery saw patient. Will order LE dopplers for patient in 2 weeks to ensure no propagation of thrombus. If it has not progressed will tx conservatively w/ warm compresses/ice/ NSAIDs.   ESRD-- on MTWF schedule - continue home TUMS and renal MVI - nephrology added forenol this admission  Hx of STEMI--in 07/2013. cath revealed 3 vessel disease but he was not a candidate for emergent CABG. He had a DES to the LAD. He had an occluded RCA that could not be fixed.  - holding home nitro  - continue home asa  and brilinta.   HTN - continue home lopressor  BID   HLD - continue home lipitor   Anxiety - holding home trazadone   DM with peripheral neuropathy-- diabetes is diet controlled. HbA1c 6.3 on 05/2010 - CBGs ACHS - cont home gabapentin   BID  GERD - cont home nexium  FEN - renal/carb mod diet  DVT ppx-- brilinta and hep [place]  Dispo:  Anticipated discharge date deferred at this time until medical conditions improved. Pt has appropriate dispo placement, CIR vs SNF  The patient does have a current PCP Dyke Maes, MD) and does not need an [facility] hospital follow-up appointment after discharge.  The patient does not have transportation limitations that hinder transportation to clinic appointments. .Services Needed at time of discharge: Y = Yes, Blank = No PT:  CIR, 24 hr supervision/assistance   OT:   RN:   Equipment:   Other:     LOS: 8 days   Gara Kroner, MD  Pager # [phone] 06/28/2014, 1:12 PM

## 2014-06-28 NOTE — Progress Notes (Signed)
Rehab admissions - I spoke with patient today.  He prefers rehab at a slower pace for a longer time.  He is fine with going to SNF for rehab.  Noted patient to be treated medically.  Lacks the medical necessity to support an acute inpatient rehab stay as well.  Agree with SNF.  Call me for questions.  #[phone]

## 2014-06-28 NOTE — Progress Notes (Signed)
Subjective:   Still having back pain, was able to work with PT for about 10 mins.   Objective Filed Vitals:   06/27/14 2041 06/28/14 0210 06/28/14 0244 06/28/14 0449  BP: 98/56 116/62 114/59 116/49  Pulse: 82 76 72 65  Temp: 99.3 F (37.4 C) 98.6 F (37 C)  98.8 F (37.1 C)  TempSrc: Oral Oral  Oral  Resp: 18 16  18   Height:      Weight:      SpO2: 98% 95% 98% 99%   Physical Exam General: alert and oriented. No acute distress. Sitting up in chair Heart: RRR GR 2/6 M Lungs: CTA, unlabored decreased bs Abdomen: obese, soft, nontender +BS liver down 6 cm Extremities: no edema Dialysis Access: L AVG +b/t small open area  HD: MTuWF [facility] 3.5h 450/A1.5 116kg 2/3.5 [place] Heparin 11K L thigh AVG No meds  Assessment/Plan: 1. Back pain- MRI yesterday- L3 L4 disc herniation. Neurosurgery consulted- pursue nonoperative care, follow up in 2-3 weeks as outpt Not coop with rehab.  2. ESRD - MWF AF, HD pending tomorrow. Culture of AVG with no growth to date- VVS following 3. Anemia - hgb 13.2 no meds.  4. Secondary hyperparathyroidism - Ca+ 8.5 no vit D, cont fosrenol 5. HTN/volume - 116/49 no volume excess, metop 6. Nutrition - renal diet 7 Right LE thrombus- acute superficial thrombus in the rt greater saphenous vein- Dr Darrick Penna saw, does not recommend coumadin. schedule duplex in 2 weeks 8 dispo- to [facility]   Jetty Duhamel, NP [facility] [phone] 06/28/2014,9:46 AM  LOS: 8 days  I have seen and examined this patient and agree with the plan of care seen, examined ,eval. And counseled. .  Ori Trejos L 06/28/2014, 11:32 AM   Additional Objective Labs: Basic Metabolic Panel:  Recent Labs Lab 06/23/14 1215  06/24/14 2007  06/26/14 0500 06/27/14 0626 06/28/14 0745  NA 134*  < > 129*  < > 133* 134* 133*  K 4.4  < > 6.6*  < > 6.3* 5.5* 4.9  CL 94*  < > 91*  < > 91* 93* 94*  CO2 27  < > 20  < > 26 25 27   GLUCOSE 89  < > 84  < > 81 75  94  BUN 48*  < > 88*  < > 76* 54* 39*  CREATININE 8.73*  < > 11.50*  < > 10.24* 8.39* 7.11*  CALCIUM 7.4*  < > 6.4*  < > 7.1* 8.2* 8.5  PHOS 5.8*  --  9.8*  --   --   --   --   < > = values in this interval not displayed. Liver Function Tests:  Recent Labs Lab 06/23/14 1215 06/24/14 2007  ALBUMIN 3.1* 2.9*   No results for input(s): LIPASE, AMYLASE in the last 168 hours. CBC:  Recent Labs Lab 06/21/14 1034 06/23/14 1215 06/24/14 2006 06/25/14 0419 06/26/14 0500  WBC 14.1* 15.8* 15.5* 11.6* 13.6*  NEUTROABS 10.3*  --   --  8.4* 9.6*  HGB 14.3 15.2 13.9 14.6 13.2  HCT 43.5 46.1 41.2 44.2 39.9  MCV 94.0 92.4 90.5 92.9 92.1  PLT 163 163 207 170 187   Blood Culture    Component Value Date/Time   SDES WOUND 06/27/2014 1200   SPECREQUEST Immunocompromised HEMODIALYSIS GRAFT 06/27/2014 1200   CULT NO GROWTH Performed at [facility]  06/27/2014 1200   REPTSTATUS PENDING 06/27/2014 1200    Cardiac Enzymes: No results for  input(s): CKTOTAL, CKMB, CKMBINDEX, TROPONINI in the last 168 hours. CBG:  Recent Labs Lab 06/26/14 2042 06/27/14 0953 06/27/14 1655 06/27/14 2241 06/28/14 0806  GLUCAP 106* 77 113* 110* 94   Iron Studies: No results for input(s): IRON, TIBC, TRANSFERRIN, FERRITIN in the last 72 hours. @ Studies/Results: Mr Lumbar Spine Wo Contrast  06/27/2014   CLINICAL DATA:  59 year old male with end-stage renal disease on hemodialysis. History diabetes, hypertension and chronic back pain.  EXAM: MRI LUMBAR SPINE WITHOUT CONTRAST  TECHNIQUE: Multiplanar, multisequence MR imaging of the lumbar spine was performed. No intravenous contrast was administered.  COMPARISON:  None.  FINDINGS: The vertebral bodies of the lumbar spine are normal in size. The vertebral bodies of the lumbar spine are normal in alignment. There is diffuse heterogeneous marrow signal throughout the lumbar spine with areas of low signal with the overall appearance compatible  with the patient's history of end-stage renal disease and renal osteodystrophy. There is degenerative disc disease with disc height loss at L3-4 and L5-S1. There are Modic endplate changes at L3-4 and L5-S1.  There is The spinal cord is normal in signal and contour. The cord terminates normally at L1 . The nerve roots of the cauda equina and the filum terminale are normal.  The visualized portions of the SI joints are unremarkable.  Multi cystic, atrophic bilateral kidneys.  T12-L1: No significant disc bulge. No evidence of neural foraminal stenosis. No central canal stenosis.  L1-L2: No significant disc bulge. No evidence of neural foraminal stenosis. No central canal stenosis.  L2-L3: Mild bilateral facet arthropathy. Mild spinal stenosis. No evidence of neural foraminal stenosis.  L3-L4: Large right paracentral and foraminal disc protrusion with mass effect on the right intraspinal L4 nerve root and the exiting L3 nerve root. There is cranial migration of disc material along the right posterior paracentral aspect of the L3 vertebral body. There is severe right foraminal stenosis. There is moderate left foraminal stenosis. There is severe bilateral facet arthropathy with ligamentum flavum infolding resulting in severe spinal stenosis.  L4-L5: Mild broad-based disc bulge. Mild bilateral facet arthropathy. Mild spinal stenosis. Mild bilateral foraminal stenosis.  L5-S1: Mild broad-based disc bulge. Mild bilateral facet arthropathy. Moderate bilateral foraminal stenosis. No central canal stenosis.  IMPRESSION: 1. At L3-4 there is a large right paracentral and foraminal disc protrusion with mass effect on the right intraspinal L4 nerve root and the exiting L3 nerve root. There is cranial migration of disc material along the right posterior paracentral aspect of the L3 vertebral body. There is severe right foraminal stenosis. There is moderate left foraminal stenosis. There is severe bilateral facet arthropathy with  ligamentum flavum infolding resulting in severe spinal stenosis. 2. Multilevel lumbar spine spondylosis as detailed above.   Electronically Signed   By: Elige Ko   On: 06/27/2014 10:10   Medications:   . aspirin EC  81 mg Oral Daily  . atorvastatin  80 mg Oral q1800  . calcium carbonate  3 tablet Oral TID  . fentaNYL  50 mcg Transdermal Q72H  . gabapentin  100 mg Oral BID  . heparin subcutaneous  5,000 Units Subcutaneous 3 times per day  . lanthanum  1,000 mg Oral TID WC  . metoprolol tartrate  25 mg Oral BID  . multivitamin  1 tablet Oral QHS  . pantoprazole  80 mg Oral Q1200  . sodium chloride  3 mL Intravenous Q12H  . sodium chloride  3 mL Intravenous Q12H  . ticagrelor  90 mg  Oral BID

## 2014-06-28 NOTE — Progress Notes (Signed)
Occupational Therapy Treatment Patient Details Name: [name] MRN: [id number] DOB: [birth year] Today's Date: 06/28/2014    History of present illness Patient is a 59 year old man with history of end-stage renal disease on hemodialysis, diabetes mellitus, hypertension, chronic back pain, and other problems as outlined in the medical history who reports pain in his right lower back radiating into his right lower extremity which is worse during hemodialysis sessions. The pain is so severe that he missed one recent HD session and cut short another. Pt had MRI which revealed L3-4 disc herniation with a free fragment migrating cephalad causing compression of the right L3 and L4 nerve root.       Follow Up Recommendations  CIR          Precautions / Restrictions Precautions Precautions: Fall Precaution Comments: Pt needs reinforcement for back precautions to minimize pain during transfers Restrictions Weight Bearing Restrictions: No       Mobility Bed Mobility Overal bed mobility: Needs Assistance Bed Mobility: Supine to Sit Rolling: Min assist Sidelying to sit: Min assist Supine to sit: Min assist     General bed mobility comments: Increased time and heavy use of bed rails. Pt obviously in pain but able to work through it with min assist for trunk stability during elevation.   Transfers Overall transfer level: Needs assistance Equipment used: 1 person hand held assist Transfers: Sit to/from Stand Sit to Stand: Min assist Stand pivot transfers: Min assist       General transfer comment: Pt was able to power-up to full standing with min assist for balance and stability. Pt with flexed posture but unable to improve posture with cueing due to pain.     Balance Overall balance assessment: Needs assistance Sitting-balance support: Feet supported;No upper extremity supported Sitting balance-Leahy Scale: Fair     Standing balance support: Bilateral upper extremity  supported;During functional activity Standing balance-Leahy Scale: Poor Standing balance comment: Requires UE support to maintain standing balance at thisRequires UE support to maintain standing balance at this time.                    ADL Overall ADL's : Needs assistance/impaired                                       General ADL Comments: pt complaining of hand pain.  Hands dry and swollen.  Encouraged ROM and retrograde massage. Pt performed. Issued  tan soft putty.  OT instructed pt to use putty.                  Cognition   Behavior During Therapy: WFL for tasks assessed/performed Overall Cognitive Status: Within Functional Limits for tasks assessed                                    Pertinent Vitals/ Pain       Pain Assessment: No/denies pain Faces Pain Scale: Hurts little more Pain Location: L hamstring Pain Descriptors / Indicators: Aching Pain Intervention(s): Monitored during session;Limited activity within patient's tolerance         Frequency       Progress Toward Goals  OT Goals(current goals can now be found in the care plan section)  Progress towards OT goals: Progressing toward goals  Acute Rehab OT Goals Patient Stated Goal: "for  pain to get better to do for myself like i used to"  Plan Discharge plan remains appropriate       End of Session     Activity Tolerance Patient tolerated treatment well   Patient Left in chair;with call bell/phone within reach;with chair alarm set   Nurse Communication Mobility status        Time: 1210-1222 OT Time Calculation (min): 12 min  Charges: OT General Charges $OT Visit: 1 Procedure OT Treatments $Self Care/Home Management : 8-22 mins  Darcee Dekker, [place] 06/28/2014, 12:32 PM

## 2014-06-28 NOTE — Progress Notes (Signed)
Physical Therapy Treatment Patient Details Name: [name] MRN: [id number] DOB: [birth year] Today's Date: 06/28/2014    History of Present Illness Patient is a 59 year old man with history of end-stage renal disease on hemodialysis, diabetes mellitus, hypertension, chronic back pain, and other problems as outlined in the medical history who reports pain in his right lower back radiating into his right lower extremity which is worse during hemodialysis sessions. The pain is so severe that he missed one recent HD session and cut short another. Pt had MRI which revealed L3-4 disc herniation with a free fragment migrating cephalad causing compression of the right L3 and L4 nerve root.     PT Comments    Pt is progressing towards physical therapy goals. Discussed rehab plan as it appears his treatment team is recommending conservative measures instead of surgery at this time. Pt was further educated on expectations of a CIR program and discussed some short term goals to focus on between sessions such sitting in the recliner, improving standing posture, and improving tolerance for functional activity. Pt was able to tolerate increased ambulation this session, however continues to demonstrate a flexed posture (elbows on walker) due to pain. Recommend trying a double platform walker next session to encourage trunk extension with safer UE support.    Follow Up Recommendations  CIR;Supervision/Assistance - 24 hour     Equipment Recommendations  None recommended by PT    Recommendations for Other Services Rehab consult     Precautions / Restrictions Precautions Precautions: Fall Precaution Comments: Pt needs reinforcement for back precautions to minimize pain during transfers Restrictions Weight Bearing Restrictions: No    Mobility  Bed Mobility Overal bed mobility: Needs Assistance Bed Mobility: Rolling;Sidelying to Sit Rolling: Modified independent (Device/Increase time) Sidelying to  sit: Min assist       General bed mobility comments: Increased time and heavy use of bed rails. Pt obviously in pain but able to work through it with min assist for trunk stability during elevation.   Transfers Overall transfer level: Needs assistance Equipment used: Rolling walker (2 wheeled) Transfers: Sit to/from Stand Sit to Stand: Min assist         General transfer comment: Pt was able to power-up to full standing with min assist for balance and stability. Pt with flexed posture but unable to improve posture with cueing due to pain.   Ambulation/Gait Ambulation/Gait assistance: Min guard Ambulation Distance (Feet): 25 Feet Assistive device: Rolling walker (2 wheeled) Gait Pattern/deviations: Step-through pattern;Decreased stride length;Trunk flexed Gait velocity: Decreased Gait velocity interpretation: Below normal speed for age/gender General Gait Details: Very flexed posture with elbow lean on walker for support. Pt unable to improve and maintain posture with cueing due to pain.    Stairs            Wheelchair Mobility    Modified Rankin (Stroke Patients Only)       Balance Overall balance assessment: Needs assistance Sitting-balance support: Feet supported;No upper extremity supported Sitting balance-Leahy Scale: Fair     Standing balance support: Bilateral upper extremity supported;During functional activity Standing balance-Leahy Scale: Poor Standing balance comment: Requires UE support to maintain standing balance at thisRequires UE support to maintain standing balance at this time.                     Cognition Arousal/Alertness: Awake/alert Behavior During Therapy: Impulsive Overall Cognitive Status: Within Functional Limits for tasks assessed  Exercises      General Comments        Pertinent Vitals/Pain Pain Assessment: Faces Faces Pain Scale: Hurts even more Pain Location: Low back radiating into  R leg, L hamstrings, palms of hands Pain Descriptors / Indicators: Aching Pain Intervention(s): Heat applied;Limited activity within patient's tolerance;Monitored during session;Repositioned (under L thigh and on palms of hands)    Home Living                      Prior Function            PT Goals (current goals can now be found in the care plan section) Acute Rehab PT Goals Patient Stated Goal: "for pain to get better to do for myself like i used to" PT Goal Formulation: With patient Time For Goal Achievement: 07/05/14 Potential to Achieve Goals: Good Progress towards PT goals: Progressing toward goals    Frequency  Min 3X/week    PT Plan Discharge plan needs to be updated    Co-evaluation             End of Session Equipment Utilized During Treatment: Gait belt Activity Tolerance: Patient limited by pain;Patient limited by fatigue Patient left: in chair;with chair alarm set;with call bell/phone within reach     Time: 0905-0928 PT Time Calculation (min) (ACUTE ONLY): 23 min  Charges:  $Gait Training: 8-22 mins $Therapeutic Activity: 8-22 mins                    G Codes:      Conni Slipper Jul 28, 2014, 10:42 AM   Conni Slipper, PT, DPT Acute Rehabilitation Services Pager: [phone]

## 2014-06-29 LAB — WOUND CULTURE: Culture: NO GROWTH

## 2014-06-29 LAB — GLUCOSE, CAPILLARY: GLUCOSE-CAPILLARY: 96 mg/dL (ref 70–99)

## 2014-06-29 NOTE — Anesthesia Postprocedure Evaluation (Signed)
Anesthesia Post Note  Patient: [name]  Procedure(s) Performed: Procedure(s) (LRB): MRI LUMBER WITHOUT CONTRAST (N/A)  Anesthesia type: General  Patient location: PACU  Post pain: Pain level controlled and Adequate analgesia  Post assessment: Post-op Vital signs reviewed, Patient's Cardiovascular Status Stable, Respiratory Function Stable, Patent Airway and Pain level controlled  Last Vitals:  Filed Vitals:   06/28/14 0945  BP: 121/64  Pulse: 70  Temp: 36.9 C  Resp: 18    Post vital signs: Reviewed and stable  Level of consciousness: awake, alert  and oriented  Complications: No apparent anesthesia complications

## 2014-07-16 ENCOUNTER — Non-Acute Institutional Stay (SKILLED_NURSING_FACILITY): Payer: Medicare Other | Admitting: Internal Medicine

## 2014-07-16 ENCOUNTER — Encounter: Payer: Self-pay | Admitting: Internal Medicine

## 2014-07-16 DIAGNOSIS — M5441 Lumbago with sciatica, right side: Secondary | ICD-10-CM

## 2014-07-16 DIAGNOSIS — N186 End stage renal disease: Secondary | ICD-10-CM | POA: Diagnosis not present

## 2014-07-16 DIAGNOSIS — I10 Essential (primary) hypertension: Secondary | ICD-10-CM

## 2014-07-16 DIAGNOSIS — M79652 Pain in left thigh: Secondary | ICD-10-CM

## 2014-07-16 DIAGNOSIS — Z992 Dependence on renal dialysis: Secondary | ICD-10-CM

## 2014-07-16 DIAGNOSIS — I82811 Embolism and thrombosis of superficial veins of right lower extremities: Secondary | ICD-10-CM

## 2014-07-16 DIAGNOSIS — I509 Heart failure, unspecified: Secondary | ICD-10-CM

## 2014-07-16 NOTE — Progress Notes (Signed)
Patient ID: [name], male   DOB: [birth year], 59 y.o.   MRN: [id number]    HISTORY AND PHYSICAL  Location:  [facility]    Place of Service: SNF [phone]   07/16/14  Extended Emergency Contact Information Primary Emergency Contact: Tait,[name] Address: [address]          [place], [place] [address] [place] Home Phone: [phone] Relation: Mother Secondary Emergency Contact: Krutz,[name]  [place] Home Phone: [phone] Relation: Sister  Advanced Directive information  FULL Code  Chief Complaint  Patient presents with  . New Admit To SNF    HPI:  60 yo male seen today for admission into SNF following hospital stay for left thigh wound, acute superficial RLE thrombosis, ESRD/HD MTWF, DM II, acute/chronic heart failure. He c/o chronic pain 8/10 on scale in his back. He gets HD via left thigh AVG 4 days per week. He has no other concerns. No nursing issues.  He is an active smoker and has no plans to quit.  He is taking prasugrel and ticagrelor for CHF. Also, he is taking ASA and lipitor  Pain meds include roxicodone, fentanyl patch and gabapentin. He also takes naproxen and sulindac    Past Medical History  Diagnosis Date  . Anxiety   . Back pain   . GERD (gastroesophageal reflux disease)   . Peripheral neuropathy   . Arthritis   . CHF (congestive heart failure)   . Active smoker   . ESRD on hemodialysis     [name] 4 days per week on M-Tu-Wed and Fri.  Started HD in 1998 and has been on HD initially at [facility], then went to [facility], then in [facility], then to [facility] and now is at [facility] for last 10 years.  Has L thigh AVG.     . Diabetes mellitus     diet controlled  . 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 PONV (postoperative nausea and vomiting)   . Hypertension   . Bell palsy   . Heart murmur   . Carpal tunnel syndrome     bilateral  . Dysrhythmia     Hx: of palpitataions   . Headache(784.0)     Hx: of Migraines    Past Surgical History  Procedure Laterality Date  . Total hip arthroplasty    . Thyroidectomy    . Tooth extraction    . Mandible fracture surgery    . Dg av dialysis graft declot or    . Insertion of dialysis catheter  03/28/2011    Procedure: INSERTION OF DIALYSIS CATHETER;  Surgeon: Pryor Ochoa, MD;  Location: [facility] OR;  Service: Vascular;  Laterality: Left;  . Av fistula placement  03/31/2011    Procedure: INSERTION OF ARTERIOVENOUS (AV) GORE-TEX GRAFT THIGH;  Surgeon: Sherren Kerns, MD;  Location: [facility] OR;  Service: Vascular;  Laterality: Right;  redo right thigh arteriovenous gortex graft using gore-tex stretch 58mm x 70cm  . Thrombectomy w/ embolectomy  06/02/2011    Procedure: THROMBECTOMY ARTERIOVENOUS GORE-TEX GRAFT;  Surgeon: Chuck Hint, MD;  Location: [facility] OR;  Service: Vascular;  Laterality: Right;  Thrombectomy right thigh arteriovenous gortex graft;  revision by  replacement of large portion of graft with 24mm gore-tex   . Insertion of dialysis catheter  08/28/2011    Procedure: INSERTION OF DIALYSIS CATHETER;  Surgeon: Fransisco Hertz, MD;  Location: [facility] OR;  Service: Vascular;  Laterality: Left;  Atempted Bilateral Internal Jugular, Bilateral Subclavin insertion of 55cm Dialysis Catheter Left Femoral  . Insertion of dialysis catheter  12/15/2011    Procedure: INSERTION OF DIALYSIS CATHETER;  Surgeon: Sherren Kerns, MD;  Location: [facility] OR;  Service: Vascular;  Laterality: Right;  Insertion of Right Femoral Dialysis Catheter  . Exchange of a dialysis catheter  02/26/2012    Procedure: EXCHANGE OF A DIALYSIS CATHETER;  Surgeon: Larina Earthly, MD;  Location: [facility] OR;  Service: Vascular;  Laterality: Right;  . Joint replacement    . Exchange of a dialysis catheter  05/18/2012    Procedure: EXCHANGE OF A DIALYSIS CATHETER;  Surgeon: Larina Earthly, MD;  Location: [facility] OR;  Service: Vascular;  Laterality: Right;  right femoral dialysis catheter  .  Av fistula placement  05/18/2012    Procedure: INSERTION OF ARTERIOVENOUS (AV) GORE-TEX GRAFT THIGH;  Surgeon: Larina Earthly, MD;  Location: [facility] OR;  Service: Vascular;  Laterality: Left;  using 6mm by 70cm goretex graft  . Thrombectomy w/ embolectomy Left 08/25/2012    Procedure: THROMBECTOMY ARTERIOVENOUS GORE-TEX GRAFT;  Surgeon: Pryor Ochoa, MD;  Location: [facility] OR;  Service: Vascular;  Laterality: Left;  Attempted thrombectomy of left thigh arteriovenous gortex graft.   . Av fistula placement Left 08/25/2012    Procedure: INSERTION OF ARTERIOVENOUS (AV) GORE-TEX GRAFT THIGH;  Surgeon: Pryor Ochoa, MD;  Location: [facility] OR;  Service: Vascular;  Laterality: Left;  Using 6mm x 40cm vascular Gortex graft.  . Revision of arteriovenous goretex graft Left 12/14/2012    Procedure: Revision of Left Thigh Graft;  Surgeon: Larina Earthly, MD;  Location: [facility] OR;  Service: Vascular;  Laterality: Left;  . Avgg removal Left 02/16/2013    Procedure: EXCISION OF LEFT ARM ARTERIOVENOUS GORETEX GRAFT TIMES 2 WITH VEIN PATCH ANGIOPLASTY OF BRACIAL ARTERY.  ;  Surgeon: Chuck Hint, MD;  Location: [facility] OR;  Service: Vascular;  Laterality: Left;  Converted from MAC to General.    . Revision of arteriovenous goretex graft Left 08/08/2013    Procedure: REVISION OF LEFT THIGH ARTERIOVENOUS GORETEX GRAFT;  Surgeon: Sherren Kerns, MD;  Location: [facility] OR;  Service: Vascular;  Laterality: Left;  . Venogram Bilateral 10/27/2011    Procedure: VENOGRAM;  Surgeon: Nada Libman, MD;  Location: [facility] CATH LAB;  Service: Cardiovascular;  Laterality: Bilateral;  bilat upper extrem venograms  . Left heart cath Bilateral 05/21/2013    Procedure: LEFT HEART CATH;  Surgeon: Iran Ouch, MD;  Location: [facility] CATH LAB;  Service: Cardiovascular;  Laterality: Bilateral;  . Percutaneous coronary stent intervention (pci-s)  05/21/2013    Procedure: PERCUTANEOUS CORONARY STENT INTERVENTION (PCI-S);  Surgeon: Iran Ouch, MD;  Location: [facility] CATH  LAB;  Service: Cardiovascular;;  . Coronary angioplasty with stent placement    . Radiology with anesthesia N/A 06/27/2014    Procedure: MRI LUMBER WITHOUT CONTRAST;  Surgeon: Medication Radiologist, MD;  Location: [facility] OR;  Service: Radiology;  Laterality: N/A;    Patient Care Team: Primitivo Gauze, MD as PCP - General (Nephrology) Primitivo Gauze, MD as Consulting Physician (Nephrology) Julio Sicks, MD (Neurosurgery)  History   Social History  . Marital Status: Single    Spouse Name: N/A  . Number of Children: N/A  . Years of Education: N/A   Occupational History  . Not on file.   Social History Main Topics  . Smoking status: Current Every Day Smoker --  1.00 packs/day for 40 years    Types: Cigarettes  . Smokeless tobacco: Never Used  . Alcohol Use: No  . Drug Use: No  . Sexual Activity: No   Other Topics Concern  . Not on file   Social History Narrative     reports that he has been smoking Cigarettes.  He has a 40 pack-year smoking history. He has never used smokeless tobacco. He reports that he does not drink alcohol or use illicit drugs.  Family History  Problem Relation Age of Onset  . Diabetes Mother   . Stroke Mother   . Heart disease Mother   . Kidney disease Father   . Hyperlipidemia Father    Family Status  Relation Status Death Age  . Mother Alive   . Father Deceased      There is no immunization history on file for this patient.  No Known Allergies  Medications: Patient's Medications  New Prescriptions   No medications on file  Previous Medications   ASPIRIN 81 MG TABLET    Take 1 tablet (81 mg total) by mouth daily.   ATORVASTATIN (LIPITOR) 80 MG TABLET    Take 1 tablet (80 mg total) by mouth daily at 6 PM.   B COMPLEX-VITAMIN C-FOLIC ACID (NEPHRO-VITE) 0.8 MG TABS    Take 0.8 mg by mouth at bedtime.    CALCIUM CARBONATE (TUMS - DOSED IN MG ELEMENTAL CALCIUM) 500 MG CHEWABLE TABLET    Chew 3 tablets by mouth 3 (three) times daily.    COLCHICINE 0.6 MG TABLET    Take 0.6 mg by mouth 2 (two) times daily as needed. For gout   DOXYCYCLINE (VIBRA-TABS) 100 MG TABLET    Take 1 tablet (100 mg total) by mouth 2 (two) times daily.   FENTANYL (DURAGESIC - DOSED MCG/HR) 50 MCG/HR    Place 1 patch (50 mcg total) onto the skin every 3 (three) days.   GABAPENTIN (NEURONTIN) 100 MG CAPSULE    Take 1 capsule by mouth 2 (two) times daily as needed.   LATANOPROST (XALATAN) 0.005 % OPHTHALMIC SOLUTION    Place 1 drop into both eyes at bedtime.    LOPERAMIDE (IMODIUM) 2 MG CAPSULE    Take 4 mg by mouth as needed for diarrhea or loose stools.   NAPROXEN SODIUM (ANAPROX) 220 MG TABLET    Take 440 mg by mouth 2 (two) times daily with a meal.   NITROGLYCERIN (NITROSTAT) 0.4 MG SL TABLET    Place 1 tablet (0.4 mg total) under the tongue every 5 (five) minutes as needed for chest pain.   OMEPRAZOLE (PRILOSEC) 40 MG CAPSULE    Take 40 mg by mouth daily.   OXYCODONE (OXY IR/ROXICODONE) 5 MG IMMEDIATE RELEASE TABLET    Take 1-2 tablets (5-10 mg total) by mouth every 4 (four) hours as needed for moderate pain.   PRASUGREL (EFFIENT) 10 MG TABS TABLET    Take 1 tablet (10 mg total) by mouth daily.   SODIUM BICARBONATE 650 MG TABLET    Take 650 mg by mouth 2 (two) times daily.     SULINDAC (CLINORIL) 200 MG TABLET    Take 200 mg by mouth 2 (two) times daily.   TICAGRELOR (BRILINTA) 90 MG TABS TABLET    Take 90 mg by mouth 2 (two) times daily.   TRAZODONE (DESYREL) 50 MG TABLET    Take 50 mg by mouth at bedtime as needed for sleep.   Modified Medications   No medications  on file  Discontinued Medications   ESOMEPRAZOLE (NEXIUM) 40 MG CAPSULE    Take 40 mg by mouth every evening.     Review of Systems  Constitutional: Negative for chills, activity change and fatigue.  HENT: Negative for sore throat and trouble swallowing.   Eyes: Negative for visual disturbance.  Respiratory: Negative for cough, chest tightness and shortness of breath.   Cardiovascular:  Negative for chest pain, palpitations and leg swelling.  Gastrointestinal: Negative for nausea, vomiting, abdominal pain and blood in stool.  Genitourinary: Negative for urgency, frequency and difficulty urinating.  Musculoskeletal: Positive for myalgias, back pain, arthralgias and gait problem.  Skin: Negative for rash.  Neurological: Negative for dizziness, seizures, weakness and headaches.  Psychiatric/Behavioral: Negative for confusion and sleep disturbance. The patient is not nervous/anxious.     Filed Vitals:   07/16/14 1554  BP: 144/89  Pulse: 84  Temp: 97.1 F (36.2 C)  SpO2: 98%   There is no weight on file to calculate BMI.  Physical Exam  Constitutional: He is oriented to person, place, and time. He appears well-developed and well-nourished.  Frail appearing in NAD. Awake and alert  HENT:  Mouth/Throat: Oropharynx is clear and moist.  Eyes: Pupils are equal, round, and reactive to light. No scleral icterus.  Neck: Neck supple. No thyromegaly present.  Cardiovascular: Normal rate, regular rhythm, normal heart sounds and intact distal pulses.  Exam reveals no gallop and no friction rub.   No murmur heard. No carotid bruit b/l; no distal LE swelling; left anterior thigh AVG with palpable thrill and audible bruit.  Pulmonary/Chest: Effort normal and breath sounds normal. He has no wheezes. He has no rales. He exhibits no tenderness.  Abdominal: Soft. Bowel sounds are normal. He exhibits no distension, no abdominal bruit, no pulsatile midline mass and no mass. There is no tenderness. There is no rebound and no guarding.  Musculoskeletal: He exhibits tenderness (back).  Lymphadenopathy:    He has no cervical adenopathy.  Neurological: He is oriented to person, place, and time. He has normal reflexes.  Skin: Skin is warm and dry. No rash noted.  Left anterior thigh scab intact without d/c. It is located just above his AVG  Psychiatric: He has a normal mood and affect. His  behavior is normal. Judgment and thought content normal.     Labs reviewed: Admission on 06/19/2014, Discharged on 06/28/2014  No results displayed because visit has over 200 results.      Dg Lumbar Spine 2-3 Views  06/19/2014   CLINICAL DATA:  Low back pain for 3 days.  No known injury.  EXAM: LUMBAR SPINE - 2-3 VIEW  COMPARISON:  09/19/2013  FINDINGS: Degenerative changes in the lumbar spine at with narrowed lumbar interspaces and associated endplate hypertrophic changes. Prominent degenerative changes at the lumbosacral interspace, similar to prior study. No vertebral compression deformities. No focal bone lesion or bone destruction. Extensive vascular calcifications. Vascular calcifications and calcification in the vas deferens.  IMPRESSION: Degenerative changes in the lumbar spine. No significant change since prior study.   Electronically Signed   By: Burman Nieves M.D.   On: 06/19/2014 21:51   Mr Lumbar Spine Wo Contrast  06/27/2014   CLINICAL DATA:  59 year old male with end-stage renal disease on hemodialysis. History diabetes, hypertension and chronic back pain.  EXAM: MRI LUMBAR SPINE WITHOUT CONTRAST  TECHNIQUE: Multiplanar, multisequence MR imaging of the lumbar spine was performed. No intravenous contrast was administered.  COMPARISON:  None.  FINDINGS: The vertebral  bodies of the lumbar spine are normal in size. The vertebral bodies of the lumbar spine are normal in alignment. There is diffuse heterogeneous marrow signal throughout the lumbar spine with areas of low signal with the overall appearance compatible with the patient's history of end-stage renal disease and renal osteodystrophy. There is degenerative disc disease with disc height loss at L3-4 and L5-S1. There are Modic endplate changes at L3-4 and L5-S1.  There is The spinal cord is normal in signal and contour. The cord terminates normally at L1 . The nerve roots of the cauda equina and the filum terminale are normal.  The  visualized portions of the SI joints are unremarkable.  Multi cystic, atrophic bilateral kidneys.  T12-L1: No significant disc bulge. No evidence of neural foraminal stenosis. No central canal stenosis.  L1-L2: No significant disc bulge. No evidence of neural foraminal stenosis. No central canal stenosis.  L2-L3: Mild bilateral facet arthropathy. Mild spinal stenosis. No evidence of neural foraminal stenosis.  L3-L4: Large right paracentral and foraminal disc protrusion with mass effect on the right intraspinal L4 nerve root and the exiting L3 nerve root. There is cranial migration of disc material along the right posterior paracentral aspect of the L3 vertebral body. There is severe right foraminal stenosis. There is moderate left foraminal stenosis. There is severe bilateral facet arthropathy with ligamentum flavum infolding resulting in severe spinal stenosis.  L4-L5: Mild broad-based disc bulge. Mild bilateral facet arthropathy. Mild spinal stenosis. Mild bilateral foraminal stenosis.  L5-S1: Mild broad-based disc bulge. Mild bilateral facet arthropathy. Moderate bilateral foraminal stenosis. No central canal stenosis.  IMPRESSION: 1. At L3-4 there is a large right paracentral and foraminal disc protrusion with mass effect on the right intraspinal L4 nerve root and the exiting L3 nerve root. There is cranial migration of disc material along the right posterior paracentral aspect of the L3 vertebral body. There is severe right foraminal stenosis. There is moderate left foraminal stenosis. There is severe bilateral facet arthropathy with ligamentum flavum infolding resulting in severe spinal stenosis. 2. Multilevel lumbar spine spondylosis as detailed above.   Electronically Signed   By: Elige Ko   On: 06/27/2014 10:10   Dg Chest Portable 1 View  06/19/2014   CLINICAL DATA:  Vascular access problem.  Shortness of breath.  EXAM: PORTABLE CHEST - 1 VIEW  COMPARISON:  May 23, 2013.  FINDINGS: The heart  size and mediastinal contours are within normal limits. No pneumothorax or plural effusion is noted. Stable mild central pulmonary vascular congestion is noted. No acute pulmonary disease is noted. The visualized skeletal structures are unremarkable.  IMPRESSION: Stable mild central pulmonary vascular congestion. No other significant abnormality seen in the chest.   Electronically Signed   By: Lupita Raider, M.D.   On: 06/19/2014 13:34   Dg Hip Unilat With Pelvis 2-3 Views Right  06/19/2014   CLINICAL DATA:  Right hip pain for 3 days. History of right hip fracture. No new injury.  EXAM: RIGHT HIP (WITH PELVIS) 2-3 VIEWS  COMPARISON:  None.  FINDINGS: Degenerative changes in the lower lumbar spine and both hips. Postoperative changes with 4 screws fixing the right femoral neck. Surgical hardware appears intact. No residual or recurrent fracture demonstrated in the right hip. Proximal left hip deformity. This may be due to old fracture deformity. Angulation of the head of precludes evaluation of the femoral neck on the left. Pelvis appears intact. No displaced fractures. SI joints and symphysis pubis are not displaced. Ligamentous  calcification along the right iliac crest. Diffuse bone demineralization.  IMPRESSION: No definite acute fracture demonstrated. Postoperative changes in the right hip. Nonspecific deformity of the left hip possibly due to old fracture deformity. Deformity limits evaluation of the left hip.   Electronically Signed   By: Burman Nieves M.D.   On: 06/19/2014 21:50   Hospital records reviewed - thigh wound was tx with Doxy and will require 10 days of tx. Wound cx neg. Hyperkalemia resolved with HD. No anticoagulation necessary for superficial thrombosis   Assessment/Plan   ICD-9-CM ICD-10-CM   1. Pain of left thigh - improving; cont wound care 729.5 M79.652   2. Acute superficial venous thrombosis of lower extremity, right - cont anti-inflammatory 453.6 I82.811   3. Right-sided  low back pain with right-sided sciatica - stable; cont pain meds 724.3 M54.41   4. Essential hypertension - stable ; cont meds 401.9 I10   5. End-stage renal disease on hemodialysis - cont HD 4 days per week 585.6 N18.6    V45.11 Z99.2   6. Chronic heart failure, unspecified heart failure type - stable; cont meds 428.9 I50.9     --f/u with cardiology as scheduled  --finish abx  --fluid restrict 1200 cc per day  --check BMP and CBC  w diff as ordered  --continue other medications as ordered  --f/u with nephrology ad HD as scheduled  --PT/OT as ordered  --GOAL: short term rehab and d/c home when medically appropriate. Communicated with pt and nursing.   Aubrielle Stroud S. Ancil Linsey  [facility] [address] [place], [place] [address] [phone] Office (Wednesdays and Fridays 8 AM - 5 PM) [phone] Cell (Monday-Friday 8 AM - 5 PM)

## 2014-07-17 ENCOUNTER — Other Ambulatory Visit: Payer: Self-pay | Admitting: *Deleted

## 2014-07-17 MED ORDER — FENTANYL 50 MCG/HR TD PT72: MEDICATED_PATCH | TRANSDERMAL | Status: DC

## 2014-07-17 MED ORDER — OXYCODONE HCL 5 MG PO TABS: 5.0000 mg | ORAL_TABLET | ORAL | Status: DC | PRN

## 2014-07-17 NOTE — Telephone Encounter (Signed)
[facility] 

## 2014-07-19 ENCOUNTER — Ambulatory Visit: Payer: Medicare Other | Admitting: Internal Medicine

## 2014-07-19 ENCOUNTER — Ambulatory Visit (INDEPENDENT_AMBULATORY_CARE_PROVIDER_SITE_OTHER): Payer: Medicare Other | Admitting: Internal Medicine

## 2014-07-19 ENCOUNTER — Encounter: Payer: Self-pay | Admitting: Internal Medicine

## 2014-07-19 VITALS — BP 122/70 | HR 84 | Ht 71.0 in | Wt 260.1 lb

## 2014-07-19 DIAGNOSIS — Z992 Dependence on renal dialysis: Secondary | ICD-10-CM | POA: Diagnosis not present

## 2014-07-19 DIAGNOSIS — N186 End stage renal disease: Secondary | ICD-10-CM | POA: Diagnosis not present

## 2014-07-19 DIAGNOSIS — I10 Essential (primary) hypertension: Secondary | ICD-10-CM

## 2014-07-19 DIAGNOSIS — I2119 ST elevation (STEMI) myocardial infarction involving other coronary artery of inferior wall: Secondary | ICD-10-CM | POA: Diagnosis not present

## 2014-07-19 NOTE — Patient Instructions (Signed)
DO NOT TAKE BRILINTA  YOU WILL STILL TAKE ASPIRIN & EFFIENT.   Your physician wants you to follow-up in: 6 MONTHS WITH DR. HILTY. You will receive a reminder letter in the mail two months in advance. If you don't receive a letter, please call our office to schedule the follow-up appointment.

## 2014-07-19 NOTE — Progress Notes (Signed)
OFFICE NOTE  Chief Complaint:  Some nausea  Primary Care Physician: Dyke Maes, MD  HPI:  [name] is a 59 y.o. male with no previous history of CAD. He is ESRD on HD, DM, neuropathy, HTN, HL. He had chest pain and went to [facility] the next day. His ECG was abnormal and showed borderline ST elevation. His troponin was elevated and his ECG became more abnormal. He was transferred to [facility] and taken directly to the cath lab. Results are below. He had 3 vessel disease, but he was not felt to be a candidate for emergent CABG. He had a DES to the LAD and PCI to the RCA was attempted but it was occluded and could not be crossed. The CFX also had high-grade stenosis and will be treated medically. His EF was preserved. There were problems with sedation as he has chronic MS pain issues. Anesthesia assisted during the case and propofol was used for sedation. With this, he tolerated the procedure without complication. He was started on a beta blocker and a statin. He is not on an ACE inhibitor because of his renal dysfunction. A ventriculogram performed during the cath showed some basal wall hypokinesis. An echocardiogram was performed but the images were poor. 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 A Nephrology consult was called to manage his volume status. He had some volume on his CXR, but this was controlled at HD. The nephrology team coordinated his HD appointments and determined his post-HD weight goal. He is to keep all HD appointments. He was seen by cardiac rehab and educated on MI restrictions, heart-healthy lifestyle modifications and exercise guidelines. He needed a rolling walker and this was ordered. He will also have a HHRN, PT and an aide.  On 01/28, he was seen by Dr. Myrtis Ser and Dr. Arlean Hopping. He had dialysis. [name] was very sure he was well enough to go home.   Today he follows up from his hospitalization. He  reports ongoing fatigue, weakness, occasional lightheadedness and dizziness with positional changes. He denies any chest pain however. He is interested in ongoing home health care and is asking for disability. Apparently he is ready on some disability, possibly arrange through nephrology, but he wonders if he can qualify for people to come and clean his house. He has not started any cardiac rehabilitation but would qualify for this. 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  [name] returns for followup today. He reports doing fairly well. He denies any chest pain or worsening shortness of breath. His weight is remaining stable on dialysis. He continues to take dual antiplatelet therapy. He will need to remain on this until January of 2016 40 to electively come off that medication if he were to have surgery on his back or his knee, both of which are giving him problems.  I saw [name] back in the office today. He was recently hospitalized again. He had a cardiology consultation regarding preoperative risk. That time it was noted that he was on aspirin and Brilinta, however he did previously been switched by myself to Effient. Unfortunately, his medicine list from his long-term care facility indicates that he is on both Brilinta and Effient. It seems like he's  had no significant bleeding complications, but it is very dangerous for him to be on both medicines. It's not clear exactly how this happened on his medicine list. In addition is also on aspirin. He denies any chest pain or worsening shortness of breath. He's not vomited up any blood. Has not had any bleeding problems on dialysis. He also denies any chest pain.  PMHx:  Past Medical History  Diagnosis Date  . Anxiety   . Back pain   . GERD  (gastroesophageal reflux disease)   . Peripheral neuropathy   . Arthritis   . CHF (congestive heart failure)   . Active smoker   . ESRD on hemodialysis     [name] 4 days per week on M-Tu-Wed and Fri.  Started HD in 1998 and has been on HD initially at [facility], then went to [place], then in [facility], then to [name] and now is at [facility] for last 10 years.  Has L thigh AVG.     . Diabetes mellitus     diet controlled  . 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 PONV (postoperative nausea and vomiting)   . Hypertension   . Bell palsy   . Heart murmur   . Carpal tunnel syndrome     bilateral  . Dysrhythmia     Hx: of palpitataions  . Headache(784.0)     Hx: of Migraines    Past Surgical History  Procedure Laterality Date  . Total hip arthroplasty    . Thyroidectomy    . Tooth extraction    . Mandible fracture surgery    . Dg av dialysis graft declot or    . Insertion of dialysis catheter  03/28/2011    Procedure: INSERTION OF DIALYSIS CATHETER;  Surgeon: Pryor Ochoa, MD;  Location: [facility] OR;  Service: Vascular;  Laterality: Left;  . Av fistula placement  03/31/2011    Procedure: INSERTION OF ARTERIOVENOUS (AV) GORE-TEX GRAFT THIGH;  Surgeon: Sherren Kerns, MD;  Location: [facility] OR;  Service: Vascular;  Laterality: Right;  redo right thigh arteriovenous gortex graft using gore-tex stretch 6mm x 70cm  . Thrombectomy w/ embolectomy  06/02/2011    Procedure: THROMBECTOMY ARTERIOVENOUS GORE-TEX GRAFT;  Surgeon: Chuck Hint, MD;  Location: [facility] OR;  Service: Vascular;  Laterality: Right;  Thrombectomy right thigh arteriovenous gortex graft;  revision by  replacement of large portion of graft with 7mm gore-tex   . Insertion of dialysis catheter  08/28/2011    Procedure: INSERTION OF DIALYSIS CATHETER;  Surgeon: Fransisco Hertz, MD;  Location: [facility] OR;  Service: Vascular;  Laterality: Left;  Atempted Bilateral Internal Jugular, Bilateral Subclavin insertion of 55cm  Dialysis Catheter Left Femoral  . Insertion of dialysis catheter  12/15/2011    Procedure: INSERTION OF DIALYSIS CATHETER;  Surgeon: Sherren Kerns, MD;  Location: [facility] OR;  Service: Vascular;  Laterality: Right;  Insertion of Right Femoral Dialysis Catheter  . Exchange of a dialysis catheter  02/26/2012    Procedure: EXCHANGE OF A DIALYSIS CATHETER;  Surgeon: Larina Earthly, MD;  Location: [facility] OR;  Service: Vascular;  Laterality: Right;  . Joint replacement    . Exchange of a dialysis catheter  05/18/2012    Procedure: EXCHANGE OF A DIALYSIS CATHETER;  Surgeon: Larina Earthly, MD;  Location: [facility] OR;  Service: Vascular;  Laterality: Right;  right femoral dialysis catheter  . Av fistula placement  05/18/2012    Procedure: INSERTION OF ARTERIOVENOUS (AV) GORE-TEX GRAFT THIGH;  Surgeon: Larina Earthly, MD;  Location: [facility] OR;  Service: Vascular;  Laterality: Left;  using 103mm by 70cm goretex graft  . Thrombectomy w/ embolectomy Left 08/25/2012    Procedure: THROMBECTOMY ARTERIOVENOUS GORE-TEX GRAFT;  Surgeon: Pryor Ochoa, MD;  Location: [facility] OR;  Service: Vascular;  Laterality: Left;  Attempted thrombectomy of left thigh arteriovenous gortex graft.   . Av fistula placement Left 08/25/2012    Procedure: INSERTION OF ARTERIOVENOUS (AV) GORE-TEX GRAFT THIGH;  Surgeon: Pryor Ochoa, MD;  Location: [facility] OR;  Service: Vascular;  Laterality: Left;  Using 34mm x 40cm vascular Gortex graft.  . Revision of arteriovenous goretex graft Left 12/14/2012    Procedure: Revision of Left Thigh Graft;  Surgeon: Larina Earthly, MD;  Location: [facility] OR;  Service: Vascular;  Laterality: Left;  . Avgg removal Left 02/16/2013    Procedure: EXCISION OF LEFT ARM ARTERIOVENOUS GORETEX GRAFT TIMES 2 WITH VEIN PATCH ANGIOPLASTY OF BRACIAL ARTERY.  ;  Surgeon: Chuck Hint, MD;  Location: [facility] OR;  Service: Vascular;  Laterality: Left;  Converted from MAC to General.    . Revision of arteriovenous goretex graft Left 08/08/2013    Procedure:  REVISION OF LEFT THIGH ARTERIOVENOUS GORETEX GRAFT;  Surgeon: Sherren Kerns, MD;  Location: [facility] OR;  Service: Vascular;  Laterality: Left;  . Venogram Bilateral 10/27/2011    Procedure: VENOGRAM;  Surgeon: Nada Libman, MD;  Location: [facility] CATH LAB;  Service: Cardiovascular;  Laterality: Bilateral;  bilat upper extrem venograms  . Left heart cath Bilateral 05/21/2013    Procedure: LEFT HEART CATH;  Surgeon: Iran Ouch, MD;  Location: [facility] CATH LAB;  Service: Cardiovascular;  Laterality: Bilateral;  . Percutaneous coronary stent intervention (pci-s)  05/21/2013    Procedure: PERCUTANEOUS CORONARY STENT INTERVENTION (PCI-S);  Surgeon: Iran Ouch, MD;  Location: [facility] CATH LAB;  Service: Cardiovascular;;  . Coronary angioplasty with stent placement    . Radiology with anesthesia N/A 06/27/2014    Procedure: MRI LUMBER WITHOUT CONTRAST;  Surgeon: Medication Radiologist, MD;  Location: [facility] OR;  Service: Radiology;  Laterality: N/A;    FAMHx:  Family History  Problem Relation Age of Onset  . Diabetes Mother   . Stroke Mother   . Heart disease Mother   . Kidney disease Father   . Hyperlipidemia Father     SOCHx:   reports that he has been smoking Cigarettes.  He has a 8 pack-year smoking history. He has never used smokeless tobacco. He reports that he does not drink alcohol or use illicit drugs.  ALLERGIES:  No Known Allergies  ROS: A comprehensive review of systems was negative except for: Gastrointestinal: positive for nausea  HOME MEDS: Current Outpatient Prescriptions  Medication Sig Dispense Refill  . aspirin 81 MG tablet Take 1 tablet (81 mg total) by mouth daily.    Marland Kitchen atorvastatin (LIPITOR) 80 MG tablet Take 1 tablet (80 mg total) by mouth daily at 6 PM. 30 tablet 11  . b complex-vitamin c-folic acid (NEPHRO-VITE) 0.8 MG TABS Take 0.8 mg by mouth at bedtime.     . calcium carbonate (TUMS - DOSED IN MG ELEMENTAL CALCIUM) 500 MG chewable tablet Chew 3 tablets by mouth 3 (three)  times daily.    . colchicine 0.6 MG tablet Take 0.6 mg by mouth 2 (two) times daily as needed. For gout    . fentaNYL (DURAGESIC - DOSED MCG/HR)  50 MCG/HR Apply one patch topically every 72 hours for pain 10 patch 0  . gabapentin (NEURONTIN) 100 MG capsule Take 1 capsule by mouth 2 (two) times daily as needed.    . latanoprost (XALATAN) 0.005 % ophthalmic solution Place 1 drop into both eyes at bedtime.   2  . loperamide (IMODIUM) 2 MG capsule Take 4 mg by mouth as needed for diarrhea or loose stools.    . naproxen sodium (ANAPROX) 220 MG tablet Take 440 mg by mouth 2 (two) times daily with a meal.    . nitroGLYCERIN (NITROSTAT) 0.4 MG SL tablet Place 1 tablet (0.4 mg total) under the tongue every 5 (five) minutes as needed for chest pain. 25 tablet 3  . omeprazole (PRILOSEC) 40 MG capsule Take 40 mg by mouth daily.    Marland Kitchen oxyCODONE (OXY IR/ROXICODONE) 5 MG immediate release tablet Take 1-2 tablets (5-10 mg total) by mouth every 4 (four) hours as needed for moderate pain. 360 tablet 0  . prasugrel (EFFIENT) 10 MG TABS tablet Take 1 tablet (10 mg total) by mouth daily. 30 tablet 6  . sodium bicarbonate 650 MG tablet Take 650 mg by mouth 2 (two) times daily.      . sulindac (CLINORIL) 200 MG tablet Take 200 mg by mouth 2 (two) times daily.    . traZODone (DESYREL) 50 MG tablet Take 50 mg by mouth at bedtime as needed for sleep.      No current facility-administered medications for this visit.    LABS/IMAGING: No results found for this or any previous visit (from the past 48 hour(s)). No results found.  VITALS: BP 122/70 mmHg  Pulse 84  Ht 5\' 11"  (1.803 m)  Wt 260 lb 2.3 oz (118 kg)  BMI 36.30 kg/m2  EXAM: General appearance: alert, appears older than stated age, no distress and moderately obese Neck: no carotid bruit and no JVD Lungs: diminished breath sounds bibasilar Heart: regular rate and rhythm Abdomen: soft, non-tender; bowel sounds normal; no masses,  no organomegaly and  obese Extremities: edema 1+ bilateral, dialysis fistula left lower thigh and No signs of infection Pulses: 2+ and symmetric Skin: Skin color, texture, turgor normal. No rashes or lesions Neurologic: Mental status: Alert, oriented, thought content appropriate Psych: Slow, fatigued appearing  EKG: Normal sinus rhythm at 84  ASSESSMENT: 1. STEMI status post DES to the LAD, and occluded RCA which may be subacute to chronic- on aspirin and Effient lifetime 2. ESRD on HD 3. Dyslipidemia 4. Near morbid obesity  PLAN: 1.   [name] has not had recurrent chest pain or shortness of breath. He seems to be doing well on dialysis. He should remain on dual antiplatelet therapy without interruption for elective procedures until at least January 2016. Preferably I would like to keep him on this lifetime. I discontinued the Brilinta from his med list. And clarified the fact that he should only be on Effient and aspirin.  He is apparently contemplating back surgery. From my standpoint he would be low risk for back surgery. Since his been over a year since his intervention, he could hold his aspirin for 10 days prior to back surgery and the Effient for 5 days prior to the procedure and restart afterwards.  Plan to see him back in 6 months or sooner as necessary.  Chrystie Nose, MD, [facility] Attending Cardiologist [facility]  HILTY,Kenneth C 07/19/2014, 3:17 PM

## 2014-07-20 ENCOUNTER — Other Ambulatory Visit: Payer: Self-pay | Admitting: Internal Medicine

## 2014-07-23 ENCOUNTER — Other Ambulatory Visit: Payer: Self-pay | Admitting: Neurosurgery

## 2014-07-23 DIAGNOSIS — M5416 Radiculopathy, lumbar region: Secondary | ICD-10-CM

## 2014-08-10 ENCOUNTER — Emergency Department (HOSPITAL_COMMUNITY): Payer: Medicare Other

## 2014-08-10 ENCOUNTER — Encounter (HOSPITAL_COMMUNITY): Payer: Self-pay | Admitting: Nurse Practitioner

## 2014-08-10 ENCOUNTER — Inpatient Hospital Stay (HOSPITAL_COMMUNITY)
Admission: EM | Admit: 2014-08-10 | Discharge: 2014-08-23 | DRG: 853 | Disposition: A | Discharge status: Skilled Nursing Facility | Payer: Medicare Other | Attending: Internal Medicine | Admitting: Internal Medicine

## 2014-08-10 DIAGNOSIS — I2510 Atherosclerotic heart disease of native coronary artery without angina pectoris: Secondary | ICD-10-CM | POA: Diagnosis not present

## 2014-08-10 DIAGNOSIS — Z96649 Presence of unspecified artificial hip joint: Secondary | ICD-10-CM | POA: Diagnosis present

## 2014-08-10 DIAGNOSIS — Z6835 Body mass index (BMI) 35.0-35.9, adult: Secondary | ICD-10-CM | POA: Diagnosis not present

## 2014-08-10 DIAGNOSIS — N2581 Secondary hyperparathyroidism of renal origin: Secondary | ICD-10-CM | POA: Diagnosis present

## 2014-08-10 DIAGNOSIS — D638 Anemia in other chronic diseases classified elsewhere: Secondary | ICD-10-CM | POA: Diagnosis present

## 2014-08-10 DIAGNOSIS — L97509 Non-pressure chronic ulcer of other part of unspecified foot with unspecified severity: Secondary | ICD-10-CM

## 2014-08-10 DIAGNOSIS — F419 Anxiety disorder, unspecified: Secondary | ICD-10-CM | POA: Diagnosis present

## 2014-08-10 DIAGNOSIS — R531 Weakness: Secondary | ICD-10-CM | POA: Diagnosis present

## 2014-08-10 DIAGNOSIS — M79604 Pain in right leg: Secondary | ICD-10-CM

## 2014-08-10 DIAGNOSIS — Z955 Presence of coronary angioplasty implant and graft: Secondary | ICD-10-CM

## 2014-08-10 DIAGNOSIS — T827XXA Infection and inflammatory reaction due to other cardiac and vascular devices, implants and grafts, initial encounter: Secondary | ICD-10-CM | POA: Insufficient documentation

## 2014-08-10 DIAGNOSIS — T82898A Other specified complication of vascular prosthetic devices, implants and grafts, initial encounter: Secondary | ICD-10-CM | POA: Diagnosis not present

## 2014-08-10 DIAGNOSIS — M545 Low back pain, unspecified: Secondary | ICD-10-CM

## 2014-08-10 DIAGNOSIS — R509 Fever, unspecified: Secondary | ICD-10-CM

## 2014-08-10 DIAGNOSIS — K219 Gastro-esophageal reflux disease without esophagitis: Secondary | ICD-10-CM | POA: Diagnosis present

## 2014-08-10 DIAGNOSIS — F17210 Nicotine dependence, cigarettes, uncomplicated: Secondary | ICD-10-CM | POA: Diagnosis present

## 2014-08-10 DIAGNOSIS — T82868A Thrombosis of vascular prosthetic devices, implants and grafts, initial encounter: Secondary | ICD-10-CM | POA: Diagnosis not present

## 2014-08-10 DIAGNOSIS — N186 End stage renal disease: Secondary | ICD-10-CM | POA: Diagnosis present

## 2014-08-10 DIAGNOSIS — E11621 Type 2 diabetes mellitus with foot ulcer: Secondary | ICD-10-CM

## 2014-08-10 DIAGNOSIS — L03116 Cellulitis of left lower limb: Secondary | ICD-10-CM

## 2014-08-10 DIAGNOSIS — R740 Nonspecific elevation of levels of transaminase and lactic acid dehydrogenase [LDH]: Secondary | ICD-10-CM | POA: Diagnosis not present

## 2014-08-10 DIAGNOSIS — K5900 Constipation, unspecified: Secondary | ICD-10-CM | POA: Diagnosis not present

## 2014-08-10 DIAGNOSIS — K7200 Acute and subacute hepatic failure without coma: Secondary | ICD-10-CM | POA: Diagnosis present

## 2014-08-10 DIAGNOSIS — R9431 Abnormal electrocardiogram [ECG] [EKG]: Secondary | ICD-10-CM | POA: Diagnosis not present

## 2014-08-10 DIAGNOSIS — M5441 Lumbago with sciatica, right side: Secondary | ICD-10-CM | POA: Diagnosis present

## 2014-08-10 DIAGNOSIS — M5136 Other intervertebral disc degeneration, lumbar region: Secondary | ICD-10-CM | POA: Diagnosis present

## 2014-08-10 DIAGNOSIS — Z833 Family history of diabetes mellitus: Secondary | ICD-10-CM | POA: Diagnosis not present

## 2014-08-10 DIAGNOSIS — I120 Hypertensive chronic kidney disease with stage 5 chronic kidney disease or end stage renal disease: Secondary | ICD-10-CM | POA: Diagnosis present

## 2014-08-10 DIAGNOSIS — G92 Toxic encephalopathy: Secondary | ICD-10-CM | POA: Diagnosis present

## 2014-08-10 DIAGNOSIS — R6521 Severe sepsis with septic shock: Secondary | ICD-10-CM | POA: Diagnosis present

## 2014-08-10 DIAGNOSIS — Z992 Dependence on renal dialysis: Secondary | ICD-10-CM | POA: Diagnosis not present

## 2014-08-10 DIAGNOSIS — M60052 Infective myositis, left thigh: Secondary | ICD-10-CM | POA: Diagnosis not present

## 2014-08-10 DIAGNOSIS — G8929 Other chronic pain: Secondary | ICD-10-CM | POA: Diagnosis present

## 2014-08-10 DIAGNOSIS — Y832 Surgical operation with anastomosis, bypass or graft as the cause of abnormal reaction of the patient, or of later complication, without mention of misadventure at the time of the procedure: Secondary | ICD-10-CM | POA: Diagnosis present

## 2014-08-10 DIAGNOSIS — T8579XA Infection and inflammatory reaction due to other internal prosthetic devices, implants and grafts, initial encounter: Secondary | ICD-10-CM | POA: Diagnosis not present

## 2014-08-10 DIAGNOSIS — Z8249 Family history of ischemic heart disease and other diseases of the circulatory system: Secondary | ICD-10-CM | POA: Diagnosis not present

## 2014-08-10 DIAGNOSIS — Z823 Family history of stroke: Secondary | ICD-10-CM | POA: Diagnosis not present

## 2014-08-10 DIAGNOSIS — I252 Old myocardial infarction: Secondary | ICD-10-CM | POA: Diagnosis not present

## 2014-08-10 DIAGNOSIS — Z7902 Long term (current) use of antithrombotics/antiplatelets: Secondary | ICD-10-CM

## 2014-08-10 DIAGNOSIS — R072 Precordial pain: Secondary | ICD-10-CM | POA: Diagnosis not present

## 2014-08-10 DIAGNOSIS — M6282 Rhabdomyolysis: Secondary | ICD-10-CM | POA: Diagnosis present

## 2014-08-10 DIAGNOSIS — I739 Peripheral vascular disease, unspecified: Secondary | ICD-10-CM | POA: Diagnosis present

## 2014-08-10 DIAGNOSIS — L97129 Non-pressure chronic ulcer of left thigh with unspecified severity: Secondary | ICD-10-CM | POA: Diagnosis present

## 2014-08-10 DIAGNOSIS — R059 Cough, unspecified: Secondary | ICD-10-CM

## 2014-08-10 DIAGNOSIS — Z791 Long term (current) use of non-steroidal anti-inflammatories (NSAID): Secondary | ICD-10-CM

## 2014-08-10 DIAGNOSIS — E780 Pure hypercholesterolemia: Secondary | ICD-10-CM | POA: Diagnosis present

## 2014-08-10 DIAGNOSIS — M4806 Spinal stenosis, lumbar region: Secondary | ICD-10-CM | POA: Diagnosis present

## 2014-08-10 DIAGNOSIS — Z8619 Personal history of other infectious and parasitic diseases: Secondary | ICD-10-CM

## 2014-08-10 DIAGNOSIS — M79659 Pain in unspecified thigh: Secondary | ICD-10-CM | POA: Insufficient documentation

## 2014-08-10 DIAGNOSIS — E1142 Type 2 diabetes mellitus with diabetic polyneuropathy: Secondary | ICD-10-CM | POA: Diagnosis present

## 2014-08-10 DIAGNOSIS — M5410 Radiculopathy, site unspecified: Secondary | ICD-10-CM | POA: Diagnosis present

## 2014-08-10 DIAGNOSIS — B182 Chronic viral hepatitis C: Secondary | ICD-10-CM | POA: Diagnosis present

## 2014-08-10 DIAGNOSIS — M549 Dorsalgia, unspecified: Secondary | ICD-10-CM | POA: Insufficient documentation

## 2014-08-10 DIAGNOSIS — A419 Sepsis, unspecified organism: Principal | ICD-10-CM | POA: Diagnosis present

## 2014-08-10 DIAGNOSIS — T82590A Other mechanical complication of surgically created arteriovenous fistula, initial encounter: Secondary | ICD-10-CM | POA: Diagnosis present

## 2014-08-10 DIAGNOSIS — I5032 Chronic diastolic (congestive) heart failure: Secondary | ICD-10-CM | POA: Diagnosis present

## 2014-08-10 DIAGNOSIS — M609 Myositis, unspecified: Secondary | ICD-10-CM | POA: Diagnosis present

## 2014-08-10 DIAGNOSIS — R579 Shock, unspecified: Secondary | ICD-10-CM | POA: Diagnosis not present

## 2014-08-10 DIAGNOSIS — Z7982 Long term (current) use of aspirin: Secondary | ICD-10-CM

## 2014-08-10 DIAGNOSIS — L97529 Non-pressure chronic ulcer of other part of left foot with unspecified severity: Secondary | ICD-10-CM | POA: Diagnosis present

## 2014-08-10 DIAGNOSIS — E875 Hyperkalemia: Secondary | ICD-10-CM | POA: Diagnosis present

## 2014-08-10 DIAGNOSIS — R05 Cough: Secondary | ICD-10-CM

## 2014-08-10 DIAGNOSIS — M25551 Pain in right hip: Secondary | ICD-10-CM | POA: Diagnosis present

## 2014-08-10 DIAGNOSIS — R748 Abnormal levels of other serum enzymes: Secondary | ICD-10-CM | POA: Diagnosis present

## 2014-08-10 DIAGNOSIS — R079 Chest pain, unspecified: Secondary | ICD-10-CM

## 2014-08-10 DIAGNOSIS — B9689 Other specified bacterial agents as the cause of diseases classified elsewhere: Secondary | ICD-10-CM | POA: Diagnosis not present

## 2014-08-10 DIAGNOSIS — R7989 Other specified abnormal findings of blood chemistry: Secondary | ICD-10-CM | POA: Diagnosis not present

## 2014-08-10 DIAGNOSIS — L02416 Cutaneous abscess of left lower limb: Secondary | ICD-10-CM | POA: Diagnosis present

## 2014-08-10 DIAGNOSIS — I10 Essential (primary) hypertension: Secondary | ICD-10-CM | POA: Diagnosis not present

## 2014-08-10 DIAGNOSIS — G9341 Metabolic encephalopathy: Secondary | ICD-10-CM | POA: Diagnosis present

## 2014-08-10 DIAGNOSIS — R945 Abnormal results of liver function studies: Secondary | ICD-10-CM

## 2014-08-10 DIAGNOSIS — R0602 Shortness of breath: Secondary | ICD-10-CM

## 2014-08-10 DIAGNOSIS — R778 Other specified abnormalities of plasma proteins: Secondary | ICD-10-CM | POA: Diagnosis present

## 2014-08-10 DIAGNOSIS — I214 Non-ST elevation (NSTEMI) myocardial infarction: Secondary | ICD-10-CM | POA: Insufficient documentation

## 2014-08-10 DIAGNOSIS — R7401 Elevation of levels of liver transaminase levels: Secondary | ICD-10-CM

## 2014-08-10 DIAGNOSIS — I959 Hypotension, unspecified: Secondary | ICD-10-CM

## 2014-08-10 HISTORY — DX: Chronic diastolic (congestive) heart failure: I50.32

## 2014-08-10 HISTORY — DX: Palpitations: R00.2

## 2014-08-10 HISTORY — DX: Atherosclerotic heart disease of native coronary artery without angina pectoris: I25.10

## 2014-08-10 LAB — I-STAT TROPONIN, ED: Troponin i, poc: 0.19 ng/mL (ref 0.00–0.08)

## 2014-08-10 LAB — COMPREHENSIVE METABOLIC PANEL
ALT: 235 U/L — ABNORMAL HIGH (ref 0–53)
AST: 1223 U/L — ABNORMAL HIGH (ref 0–37)
Albumin: 2.5 g/dL — ABNORMAL LOW (ref 3.5–5.2)
Alkaline Phosphatase: 248 U/L — ABNORMAL HIGH (ref 39–117)
Anion gap: 12 (ref 5–15)
BUN: 23 mg/dL (ref 6–23)
CALCIUM: 9.7 mg/dL (ref 8.4–10.5)
CO2: 28 mmol/L (ref 19–32)
CREATININE: 4.6 mg/dL — AB (ref 0.50–1.35)
Chloride: 95 mmol/L — ABNORMAL LOW (ref 96–112)
GFR calc Af Amer: 15 mL/min — ABNORMAL LOW (ref >90)
GFR, EST NON AFRICAN AMERICAN: 13 mL/min — AB (ref >90)
GLUCOSE: 90 mg/dL (ref 70–99)
Potassium: 5.2 mmol/L — ABNORMAL HIGH (ref 3.5–5.1)
Sodium: 135 mmol/L (ref 135–145)
Total Bilirubin: 2.7 mg/dL — ABNORMAL HIGH (ref 0.3–1.2)
Total Protein: 7.9 g/dL (ref 6.0–8.3)

## 2014-08-10 LAB — LACTIC ACID, PLASMA
Lactic Acid, Venous: 1.8 mmol/L (ref 0.5–2.0)
Lactic Acid, Venous: 1.8 mmol/L (ref 0.5–2.0)
Lactic Acid, Venous: 1.4 mmol/L (ref 0.5–2.0)

## 2014-08-10 LAB — CBC WITH DIFFERENTIAL/PLATELET
Basophils Absolute: 0 10*3/uL (ref 0.0–0.1)
Basophils Relative: 0 % (ref 0–1)
Eosinophils Absolute: 0.6 10*3/uL (ref 0.0–0.7)
Eosinophils Relative: 4 % (ref 0–5)
HEMATOCRIT: 37.7 % — AB (ref 39.0–52.0)
HEMOGLOBIN: 12.1 g/dL — AB (ref 13.0–17.0)
LYMPHS ABS: 0.4 10*3/uL — AB (ref 0.7–4.0)
Lymphocytes Relative: 3 % — ABNORMAL LOW (ref 12–46)
MCH: 30.7 pg (ref 26.0–34.0)
MCHC: 32.1 g/dL (ref 30.0–36.0)
MCV: 95.7 fL (ref 78.0–100.0)
MONO ABS: 0.3 10*3/uL (ref 0.1–1.0)
MONOS PCT: 2 % — AB (ref 3–12)
NEUTROS PCT: 91 % — AB (ref 43–77)
Neutro Abs: 13.5 10*3/uL — ABNORMAL HIGH (ref 1.7–7.7)
Platelets: 253 10*3/uL (ref 150–400)
RBC: 3.94 MIL/uL — AB (ref 4.22–5.81)
RDW: 16.7 % — ABNORMAL HIGH (ref 11.5–15.5)
WBC: 14.8 10*3/uL — ABNORMAL HIGH (ref 4.0–10.5)

## 2014-08-10 LAB — PROTIME-INR
INR: 1.11 (ref 0.00–1.49)
INR: 1.1 (ref 0.00–1.49)
Prothrombin Time: 14.4 seconds (ref 11.6–15.2)
Prothrombin Time: 14.3 seconds (ref 11.6–15.2)

## 2014-08-10 LAB — APTT
aPTT: 49 seconds — ABNORMAL HIGH (ref 24–37)
aPTT: 34 seconds (ref 24–37)

## 2014-08-10 LAB — LIPASE, BLOOD: Lipase: 31 U/L (ref 11–59)

## 2014-08-10 LAB — PROCALCITONIN: PROCALCITONIN: 8.76 ng/mL

## 2014-08-10 LAB — ACETAMINOPHEN LEVEL: Acetaminophen (Tylenol), Serum: <10 ug/mL — ABNORMAL LOW (ref 10–30)

## 2014-08-10 LAB — CK: Total CK: 11720 U/L — ABNORMAL HIGH (ref 7–232)

## 2014-08-10 LAB — TROPONIN I: TROPONIN I: 0.12 ng/mL — AB (ref <0.031)

## 2014-08-10 LAB — GLUCOSE, CAPILLARY: GLUCOSE-CAPILLARY: 75 mg/dL (ref 70–99)

## 2014-08-10 LAB — I-STAT CG4 LACTIC ACID, ED: Lactic Acid, Venous: 1.44 mmol/L (ref 0.5–2.0)

## 2014-08-10 LAB — MRSA PCR SCREENING: MRSA by PCR: NEGATIVE

## 2014-08-10 MED ORDER — PIPERACILLIN-TAZOBACTAM IN DEX 2-0.25 GM/50ML IV SOLN
2.2500 g | Freq: Three times a day (TID) | INTRAVENOUS | Status: DC
  Administered 2014-08-10 – 2014-08-12 (×6): 2.25 g via INTRAVENOUS
  Filled 2014-08-10 (×8): qty 50

## 2014-08-10 MED ORDER — ONDANSETRON HCL 4 MG PO TABS: 4.0000 mg | ORAL_TABLET | Freq: Four times a day (QID) | ORAL | Status: DC | PRN

## 2014-08-10 MED ORDER — LATANOPROST 0.005 % OP SOLN
1.0000 [drp] | Freq: Every day | OPHTHALMIC | Status: DC
  Administered 2014-08-10 – 2014-08-22 (×12): 1 [drp] via OPHTHALMIC
  Filled 2014-08-10: qty 2.5

## 2014-08-10 MED ORDER — FENTANYL 50 MCG/HR TD PT72: 50.0000 ug | MEDICATED_PATCH | TRANSDERMAL | Status: DC

## 2014-08-10 MED ORDER — ONDANSETRON HCL 4 MG/2ML IJ SOLN: 4.0000 mg | Freq: Four times a day (QID) | INTRAMUSCULAR | Status: DC | PRN

## 2014-08-10 MED ORDER — HYDROMORPHONE HCL 1 MG/ML IJ SOLN
0.5000 mg | Freq: Once | INTRAMUSCULAR | Status: DC
  Filled 2014-08-10: qty 1

## 2014-08-10 MED ORDER — THIAMINE HCL 100 MG/ML IJ SOLN
100.0000 mg | Freq: Every day | INTRAMUSCULAR | Status: DC
  Administered 2014-08-10 – 2014-08-13 (×4): 100 mg via INTRAVENOUS
  Filled 2014-08-10 (×4): qty 2

## 2014-08-10 MED ORDER — HEPARIN SODIUM (PORCINE) 5000 UNIT/ML IJ SOLN
5000.0000 [IU] | Freq: Three times a day (TID) | INTRAMUSCULAR | Status: DC
  Administered 2014-08-10 – 2014-08-23 (×33): 5000 [IU] via SUBCUTANEOUS
  Filled 2014-08-10 (×28): qty 1

## 2014-08-10 MED ORDER — ASPIRIN EC 325 MG PO TBEC
325.0000 mg | DELAYED_RELEASE_TABLET | Freq: Every day | ORAL | Status: DC
Start: 2014-08-10 — End: 2014-08-23
  Administered 2014-08-11 – 2014-08-23 (×12): 325 mg via ORAL
  Filled 2014-08-10 (×12): qty 1

## 2014-08-10 MED ORDER — SODIUM BICARBONATE 650 MG PO TABS
650.0000 mg | ORAL_TABLET | Freq: Two times a day (BID) | ORAL | Status: DC
  Administered 2014-08-10 – 2014-08-14 (×8): 650 mg via ORAL
  Filled 2014-08-10 (×8): qty 1

## 2014-08-10 MED ORDER — SODIUM CHLORIDE 0.9 % IV BOLUS (SEPSIS)
250.0000 mL | Freq: Once | INTRAVENOUS | Status: AC
  Administered 2014-08-10: 250 mL via INTRAVENOUS

## 2014-08-10 MED ORDER — MORPHINE SULFATE 4 MG/ML IJ SOLN
4.0000 mg | Freq: Once | INTRAMUSCULAR | Status: AC
  Administered 2014-08-10: 4 mg via INTRAVENOUS
  Filled 2014-08-10: qty 1

## 2014-08-10 MED ORDER — ASPIRIN 81 MG PO CHEW
324.0000 mg | CHEWABLE_TABLET | Freq: Once | ORAL | Status: AC
  Administered 2014-08-10: 324 mg via ORAL
  Filled 2014-08-10: qty 4

## 2014-08-10 MED ORDER — FENTANYL CITRATE (PF) 100 MCG/2ML IJ SOLN
50.0000 ug | Freq: Once | INTRAMUSCULAR | Status: AC
  Administered 2014-08-10: 50 ug via INTRAVENOUS
  Filled 2014-08-10: qty 2

## 2014-08-10 MED ORDER — SODIUM CHLORIDE 0.9 % IV BOLUS (SEPSIS)
250.0000 mL | Freq: Once | INTRAVENOUS | Status: AC
Start: 2014-08-10 — End: 2014-08-10
  Administered 2014-08-10: 250 mL via INTRAVENOUS

## 2014-08-10 MED ORDER — ONDANSETRON HCL 4 MG/2ML IJ SOLN
4.0000 mg | Freq: Once | INTRAMUSCULAR | Status: AC
  Administered 2014-08-10: 4 mg via INTRAVENOUS
  Filled 2014-08-10: qty 2

## 2014-08-10 MED ORDER — CHLORHEXIDINE GLUCONATE CLOTH 2 % EX PADS
6.0000 | MEDICATED_PAD | Freq: Every day | CUTANEOUS | Status: DC
  Administered 2014-08-11 – 2014-08-18 (×8): 6 via TOPICAL

## 2014-08-10 MED ORDER — SODIUM CHLORIDE 0.9 % IV SOLN
INTRAVENOUS | Status: DC
  Administered 2014-08-10: 13:00:00 via INTRAVENOUS

## 2014-08-10 MED ORDER — FOLIC ACID 5 MG/ML IJ SOLN
1.0000 mg | Freq: Every day | INTRAMUSCULAR | Status: DC
  Administered 2014-08-10 – 2014-08-13 (×3): 1 mg via INTRAVENOUS
  Filled 2014-08-10 (×5): qty 0.2

## 2014-08-10 NOTE — ED Notes (Signed)
Per PTAR patient from [facility] patients had 30 min of dialysis and then c/o of feeling week all over. Patient is from [facility].     100/60  94%/RA 16 88- regular CBG 99

## 2014-08-10 NOTE — Progress Notes (Signed)
Notified on call NP of patients BP now being 104/63. HR 83, NSR. Patient resting easy. Will continue to monitor.  Spoke with main lab about POCT Infectious Mono test, and paged Point of Care. Point of care states that we do not offer that specific test here, that the blood draw Mono test needs to be ordered. Lab is currently drawing this lab.

## 2014-08-10 NOTE — Consult Note (Signed)
Referring Provider: No ref. provider found Primary Care Physician:  Dyke Maes, MD Primary Nephrologist:    Reason for Consultation:  Medical management of dialysis patient including volume management and anemia and Secondary HPT  HPI: Know ESRD and receives dialysis at [facility]  He had dialysis today  He has chronic pain and is has a Left thigh graft. He has been resident of [facility]. He has diffuse weakness and loss of muscle strength . He denies pain at the moment and was medicated with narcotics in the ER   Past Medical History  Diagnosis Date  . Anxiety   . Back pain   . GERD (gastroesophageal reflux disease)   . Peripheral neuropathy   . Arthritis   . Chronic diastolic CHF (congestive heart failure)     a. 04/2013 Echo: EF nl, no rwma, mild LVH.  Marland Kitchen Active smoker   . ESRD on hemodialysis     [name] [facility] 4 days per week on M-Tu-Wed and Fri.  Started HD in 1998 and has been on HD initially at [facility], then went to [facility], then in [facility], then to [facility] and now is at [facility] for last 10 years.  Has L thigh AVG.     . Diabetes mellitus     diet controlled  . 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 PONV (postoperative nausea and vomiting)   . Hypertension   . Bell palsy   . Carpal tunnel syndrome     bilateral  . Palpitations   . Headache(784.0)     Hx: of Migraines  . CAD (coronary artery disease)     a. 04/2013 Ant STEMI/PCI: LM nl, LAD 95p (3.0x18 Xience DES), D1/2/3 small, LCX 80ost, RI 20p, RCA 11m CTO (unsuccessful PCI), EF 55%, mod basal inf HK.    Past Surgical History  Procedure Laterality Date  . Total hip arthroplasty    . Thyroidectomy    . Tooth extraction    . Mandible fracture surgery    . Dg av dialysis graft declot or    . Insertion of dialysis catheter  03/28/2011    Procedure: INSERTION OF DIALYSIS CATHETER;  Surgeon: Pryor Ochoa, MD;  Location: [facility] OR;  Service: Vascular;  Laterality: Left;  .  Av fistula placement  03/31/2011    Procedure: INSERTION OF ARTERIOVENOUS (AV) GORE-TEX GRAFT THIGH;  Surgeon: Sherren Kerns, MD;  Location: [facility] OR;  Service: Vascular;  Laterality: Right;  redo right thigh arteriovenous gortex graft using gore-tex stretch 6mm x 70cm  . Thrombectomy w/ embolectomy  06/02/2011    Procedure: THROMBECTOMY ARTERIOVENOUS GORE-TEX GRAFT;  Surgeon: Chuck Hint, MD;  Location: [facility] OR;  Service: Vascular;  Laterality: Right;  Thrombectomy right thigh arteriovenous gortex graft;  revision by  replacement of large portion of graft with 7mm gore-tex   . Insertion of dialysis catheter  08/28/2011    Procedure: INSERTION OF DIALYSIS CATHETER;  Surgeon: Fransisco Hertz, MD;  Location: [facility] OR;  Service: Vascular;  Laterality: Left;  Atempted Bilateral Internal Jugular, Bilateral Subclavin insertion of 55cm Dialysis Catheter Left Femoral  . Insertion of dialysis catheter  12/15/2011    Procedure: INSERTION OF DIALYSIS CATHETER;  Surgeon: Sherren Kerns, MD;  Location: [facility] OR;  Service: Vascular;  Laterality: Right;  Insertion of Right Femoral Dialysis Catheter  . Exchange of a dialysis catheter  02/26/2012    Procedure: EXCHANGE OF  A DIALYSIS CATHETER;  Surgeon: Larina Earthly, MD;  Location: [facility] OR;  Service: Vascular;  Laterality: Right;  . Joint replacement    . Exchange of a dialysis catheter  05/18/2012    Procedure: EXCHANGE OF A DIALYSIS CATHETER;  Surgeon: Larina Earthly, MD;  Location: [facility] OR;  Service: Vascular;  Laterality: Right;  right femoral dialysis catheter  . Av fistula placement  05/18/2012    Procedure: INSERTION OF ARTERIOVENOUS (AV) GORE-TEX GRAFT THIGH;  Surgeon: Larina Earthly, MD;  Location: [facility] OR;  Service: Vascular;  Laterality: Left;  using 6mm by 70cm goretex graft  . Thrombectomy w/ embolectomy Left 08/25/2012    Procedure: THROMBECTOMY ARTERIOVENOUS GORE-TEX GRAFT;  Surgeon: Pryor Ochoa, MD;  Location: [facility] OR;  Service: Vascular;  Laterality: Left;  Attempted  thrombectomy of left thigh arteriovenous gortex graft.   . Av fistula placement Left 08/25/2012    Procedure: INSERTION OF ARTERIOVENOUS (AV) GORE-TEX GRAFT THIGH;  Surgeon: Pryor Ochoa, MD;  Location: [facility] OR;  Service: Vascular;  Laterality: Left;  Using 6mm x 40cm vascular Gortex graft.  . Revision of arteriovenous goretex graft Left 12/14/2012    Procedure: Revision of Left Thigh Graft;  Surgeon: Larina Earthly, MD;  Location: [facility] OR;  Service: Vascular;  Laterality: Left;  . Avgg removal Left 02/16/2013    Procedure: EXCISION OF LEFT ARM ARTERIOVENOUS GORETEX GRAFT TIMES 2 WITH VEIN PATCH ANGIOPLASTY OF BRACIAL ARTERY.  ;  Surgeon: Chuck Hint, MD;  Location: [facility] OR;  Service: Vascular;  Laterality: Left;  Converted from MAC to General.    . Revision of arteriovenous goretex graft Left 08/08/2013    Procedure: REVISION OF LEFT THIGH ARTERIOVENOUS GORETEX GRAFT;  Surgeon: Sherren Kerns, MD;  Location: [facility] OR;  Service: Vascular;  Laterality: Left;  . Venogram Bilateral 10/27/2011    Procedure: VENOGRAM;  Surgeon: Nada Libman, MD;  Location: [facility] CATH LAB;  Service: Cardiovascular;  Laterality: Bilateral;  bilat upper extrem venograms  . Left heart cath Bilateral 05/21/2013    Procedure: LEFT HEART CATH;  Surgeon: Iran Ouch, MD;  Location: [facility] CATH LAB;  Service: Cardiovascular;  Laterality: Bilateral;  . Percutaneous coronary stent intervention (pci-s)  05/21/2013    Procedure: PERCUTANEOUS CORONARY STENT INTERVENTION (PCI-S);  Surgeon: Iran Ouch, MD;  Location: [facility] CATH LAB;  Service: Cardiovascular;;  . Coronary angioplasty with stent placement    . Radiology with anesthesia N/A 06/27/2014    Procedure: MRI LUMBER WITHOUT CONTRAST;  Surgeon: Medication Radiologist, MD;  Location: [facility] OR;  Service: Radiology;  Laterality: N/A;    Prior to Admission medications   Medication Sig Start Date End Date Taking? Authorizing Provider  aspirin 81 MG tablet Take 1 tablet (81 mg total) by  mouth daily. 05/24/13  Yes Rhonda G Barrett, PA-C  b complex-vitamin c-folic acid (NEPHRO-VITE) 0.8 MG TABS Take 0.8 mg by mouth at bedtime.    Yes Historical Provider, MD  calcium carbonate (TUMS - DOSED IN MG ELEMENTAL CALCIUM) 500 MG chewable tablet Chew 3 tablets by mouth 3 (three) times daily.   Yes Historical Provider, MD  fentaNYL (DURAGESIC - DOSED MCG/HR) 50 MCG/HR Apply one patch topically every 72 hours for pain 07/17/14  Yes Mahima Pandey, MD  latanoprost (XALATAN) 0.005 % ophthalmic solution Place 1 drop into both eyes at bedtime.  04/20/14  Yes Historical Provider, MD  omeprazole (PRILOSEC) 40 MG capsule Take 40 mg by mouth daily.   Yes Historical Provider, MD  oxyCODONE (OXY IR/ROXICODONE) 5 MG immediate release tablet Take 1-2 tablets (5-10 mg total) by mouth every 4 (four) hours as needed for moderate pain. 07/17/14  Yes Mahima Glade Lloyd, MD  atorvastatin (LIPITOR) 80 MG tablet Take 1 tablet (80 mg total) by mouth daily at 6 PM. 05/24/13   Rhonda G Barrett, PA-C  colchicine 0.6 MG tablet Take 0.6 mg by mouth 2 (two) times daily as needed. For gout    Historical Provider, MD  gabapentin (NEURONTIN) 100 MG capsule Take 1 capsule by mouth 2 (two) times daily as needed. 12/29/13   Historical Provider, MD  loperamide (IMODIUM) 2 MG capsule Take 4 mg by mouth as needed for diarrhea or loose stools.    Historical Provider, MD  naproxen sodium (ANAPROX) 220 MG tablet Take 440 mg by mouth 2 (two) times daily with a meal.    Historical Provider, MD  nitroGLYCERIN (NITROSTAT) 0.4 MG SL tablet Place 1 tablet (0.4 mg total) under the tongue every 5 (five) minutes as needed for chest pain. 05/24/13   Rhonda G Barrett, PA-C  prasugrel (EFFIENT) 10 MG TABS tablet Take 1 tablet (10 mg total) by mouth daily. 06/16/13   Chrystie Nose, MD  sodium bicarbonate 650 MG tablet Take 650 mg by mouth 2 (two) times daily.      Historical Provider, MD  sulindac (CLINORIL) 200 MG tablet Take 200 mg by mouth 2 (two) times  daily.    Historical Provider, MD  traZODone (DESYREL) 50 MG tablet Take 50 mg by mouth at bedtime as needed for sleep.  03/06/13   Historical Provider, MD    Current Facility-Administered Medications  Medication Dose Route Frequency Provider Last Rate Last Dose  . aspirin EC tablet 325 mg  325 mg Oral Daily Russella Dar, NP      . Melene Muller ON 08/11/2014] Chlorhexidine Gluconate Cloth 2 % PADS 6 each  6 each Topical Q0600 Rhetta Mura, MD      . folic acid injection 1 mg  1 mg Intravenous Daily Russella Dar, NP   1 mg at 08/10/14 6948  . heparin injection 5,000 Units  5,000 Units Subcutaneous 3 times per day Russella Dar, NP      . latanoprost (XALATAN) 0.005 % ophthalmic solution 1 drop  1 drop Both Eyes QHS Russella Dar, NP      . ondansetron [facility]) tablet 4 mg  4 mg Oral Q6H PRN Russella Dar, NP       Or  . ondansetron [facility]) injection 4 mg  4 mg Intravenous Q6H PRN Russella Dar, NP      . piperacillin-tazobactam (ZOSYN) IVPB 2.25 g  2.25 g Intravenous 3 times per day Sampson Si, RPH   2.25 g at 08/10/14 1822  . sodium bicarbonate tablet 650 mg  650 mg Oral BID Russella Dar, NP      . thiamine (B-1) injection 100 mg  100 mg Intravenous Daily Russella Dar, NP   100 mg at 08/10/14 1822    Allergies as of 08/10/2014  . (No Known Allergies)    Family History  Problem Relation Age of Onset  . Diabetes Mother   . Stroke Mother   . Heart disease Mother   . Kidney disease Father   . Hyperlipidemia Father     History   Social History  . Marital Status: Single    Spouse Name: N/A  . Number of Children: N/A  . Years of Education:  N/A   Occupational History  . Not on file.   Social History Main Topics  . Smoking status: Current Every Day Smoker -- 0.20 packs/day for 40 years    Types: Cigarettes  . Smokeless tobacco: Never Used  . Alcohol Use: No  . Drug Use: No  . Sexual Activity: No   Other Topics Concern  . Not on file   Social  History Narrative    Review of Systems: Gen: Denies any fever, chills, sweats,+ anorexia HEENT: No visual complaints, No history of Retinopathy. Normal external appearance No Epistaxis or Sore throat. No sinusitis.   CV: Denies chest pain, angina,  Resp: Denies dyspnea at rest, dyspnea with exercise, cough, sputum, wheezing, coughing up blood, and pleurisy. GI: Denies vomiting blood, jaundice, and fecal incontinence.   Denies dysphagia or odynophagia. GU : Anuric dialysis ZO:XWRUEAV pains and  cramps, atrophy. Ongoing narcotic use Derm: Denies rash, itching, dry skin, hives, moles, warts, or unhealing ulcers.  Psych: Denies depression, anxiety, memory loss, suicidal ideation, hallucinations, paranoia, and confusion. Heme: Denies bruising, bleeding, and enlarged lymph nodes. Neuro: No headache.  No diplopia. No dysarthria.  No dysphasia.  No history of CVA.  No Seizures. No paresthesias.  No weakness. Endocrine No Thyroid disease.  No Adrenal disease. Diabetic Physical Exam: Vital signs in last 24 hours: Temp:  [98 F (36.7 C)-98.1 F (36.7 C)] 98.1 F (36.7 C) (04/15 1850) Pulse Rate:  [89-117] 98 (04/15 1942) Resp:  [17-25] 18 (04/15 1942) BP: (84-165)/(43-95) 92/54 mmHg (04/15 1942) SpO2:  [88 %-98 %] 98 % (04/15 1942) Weight:  [113.898 kg (251 lb 1.6 oz)] 113.898 kg (251 lb 1.6 oz) (04/15 1713) Last BM Date: 08/09/14 General:   Poor health Head:  Normocephalic and atraumatic. Eyes:  Sclera clear, no icterus.   Conjunctiva pink. Ears:  Normal auditory acuity. Nose:  No deformity, discharge,  or lesions. Mouth:  No deformity or lesions, dentition normal. Neck:  Supple; no masses or thyromegaly. JVP not elevated Lungs:  Clear throughout to auscultation.   No wheezes, crackles, or rhonchi. No acute distress. Heart:  Regular rate and rhythm; no murmurs, clicks, rubs,  or gallops. Abdomen:  Soft, nontender and nondistended. No masses, hepatosplenomegaly or hernias noted. Normal  bowel sounds, without guarding, and without rebound.   Msk: Lying in bed  Pulses:  Symmetrical  Extremities:  Without clubbing or edema. Neurologic:  Alert and  oriented x4;  grossly normal neurologically. Skin:  Intact without significant lesions or rashes. Psych:  Alert and cooperative. Normal mood and affect.  Intake/Output from previous day:   Intake/Output this shift:    Lab Results:  Recent Labs  08/10/14 1248  WBC 14.8*  HGB 12.1*  HCT 37.7*  PLT 253   BMET  Recent Labs  08/10/14 1248  NA 135  K 5.2*  CL 95*  CO2 28  GLUCOSE 90  BUN 23  CREATININE 4.60*  CALCIUM 9.7   LFT  Recent Labs  08/10/14 1248  PROT 7.9  ALBUMIN 2.5*  AST 1223*  ALT 235*  ALKPHOS 248*  BILITOT 2.7*   PT/INR  Recent Labs  08/10/14 1248 08/10/14 1830  LABPROT 14.4 14.3  INR 1.11 1.10   Hepatitis Panel No results for input(s): HEPBSAG, HCVAB, HEPAIGM, HEPBIGM in the last 72 hours.  Studies/Results: Dg Chest 1 View  08/10/2014   CLINICAL DATA:  Chronic low back pain, now with hip pain  EXAM: CHEST  1 VIEW  COMPARISON:  06/19/2014  FINDINGS: Lungs  are essentially clear. No focal consolidation. No pleural effusion or pneumothorax.  The heart is top-normal in size.  Surgical clips overlying the neck.  IMPRESSION: No evidence of acute cardiopulmonary disease.   Electronically Signed   By: Charline Bills M.D.   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

## 2014-08-10 NOTE — Progress Notes (Addendum)
Patient seen, examined and plan formulated with Ms Rennis Harding, NP at the patient's bedside   59 [facility] 3 v CAD, s/p DES-->Cath 04/2013 3vd, ESRD on HD, DM + Nephro/neuropathy, Morbid obesity, There is no weight on file to calculate BMI.    Patient is a pretty poor historian and cannot give either myself nor Ms Rennis Harding clear and current history   Weak 2/52--->legs coluldn't do nothing. +back pain in Lower lumbar arera, -shoulder or upper back pain taking 2 pills tylenol a day of tylenol for ~ 6 months for the overall pains ++ naused ~ 1/52 ago--not eating much at all-has been having some right upper quadrant pain X1 week with discomfort and nausea since then. States occasionally Couldn't even take in water Sleeps sitting up has done this for a long time Question in multiple different ways by both ED provider as well as by [place] and he cannot tell me specifically if he is having chest pain. Spitting at the bedside clear phlegm however able to take some applesauce and fluids   Laboratory significant for usual hyperkalemia 5.2-has been higher than this in the past chest x-ray 1 view no acute disease Alkaline phosphatase 248, AST 1223, ALT 235, total bili 2.7--- these are new findings from prior LFTs performed 06/19/14 POC troponin 0.19, regular troponin 0.12, lactic acid 1.44 EKG my read PR interval 0.12 QRS axis 45, ST depressions in V4 5 and 6 corresponding with with possible anterolateral wall p pathology INR 1.11   Review of chart reveals on 06/27/14 multilevel disc degeneration with right paracentral foraminal disc per children and migration with severe foraminal stenosis severe bilateral facet arthropathy I agree     On exam Alert but somewhat confused male, cannot appreciate asterixis Arcus senilis Mild icterus mallampati 3 Abdomen soft, pressure on epigastrium causes discomfort but seems equivocal Increased lumbar lordosis bilaterally Nonfocal neuro exam however  confused to some extent and seems a little encephalopathic  Plan Patient presents with toxic metabolic encephalopathy with unknown cause his LFTs are elevated and he has no specific source for his confusion He became progressively hypotensive in the emergency room and I gave him 250 cc bolus I'm concerned that he might have sepsis either from an ascending cholangitis/gallbladder issue or he may have line abscess/femoral access bacteremia. At this time because our main source seems to be in the liver, we will obtain #1 ultrasound abdomen stat--i ADDEND have added acute hepatits panel, EBV  Etc.  Screen for HIV if these initial tests neg #2 critical care consult-might need CVP and extra lines  #3 start broad-spectrum antibiotics-start with Zosyn. Potentially add vancomycin if abdominal ultrasound is negative as this would then be an indication that source may be elsewhere #4 we appreciate cardiology input for acute coronary syndrome--he apparently has not been taking any of his antiplatelet agents and has been managed medically so far. #5 nephrology has been consulted and will dialyze him in the morning-they're aware that he is going to step down   Patient is critically ill If decompensation occurs we request critical care medicine to kindly take over the patient's care until he stabilizes  Admitted to step down  ADDEND I have discussed again 8:05 PM with Dr. Elvis Coil of Nephrology who will see patient in consult.  I have alerted our night coverage to re-assess this patient as i got called about patient;s persisitng hypotension..If these abnormal clinical findings persist, appropriate workup will need to be considered inclusive of pressors to maintain  BP.  He has poor access- but May be able to use Femroal line for access His situation is gaurded. He is at higfh risk for deceompensation   CRITICAL CARE Performed by: Rhetta Mura   Total critical care time: 45  Critical care  time was exclusive of separately billable procedures and treating other patients.  Critical care was necessary to treat or prevent imminent or life-threatening deterioration.  Critical care was time spent personally by me on the following activities: development of treatment plan with patient and/or surrogate as well as nursing, discussions with consultants, evaluation of patient's response to treatment, examination of patient, obtaining history from patient or surrogate, ordering and performing treatments and interventions, ordering and review of laboratory studies, ordering and review of radiographic studies, pulse oximetry and re-evaluation of patient's condition.   Pleas Koch, MD [facility] [phone]

## 2014-08-10 NOTE — Progress Notes (Signed)
BP thigh cuff, right leg, 87/71. Removed Fentanyl Patch from Patients Left chest. MD notified. Will continue to monitor. Stopped Fluids per MD order.

## 2014-08-10 NOTE — ED Notes (Signed)
Patient c/o generalized weakness and malaise over the past week with coughing and body aches. Patient has had flu shot this season. Patient went to dialysis M/Tu/W/Fr- patient last full dialysis was Wednesday and went today despite not feeling well. Patient completed 30 min of dialysis but was having back pain and feeling very weak so he stopped dialysis and came over here. Patient endorses mild lightheadedness but denies dizziness, shortness of breath, syncope, chest pain nausea or vomiting or diarrhea.

## 2014-08-10 NOTE — Consult Note (Signed)
Name: [name] MRN: [id number] DOB: [birth year]    ADMISSION DATE:  08/10/2014 CONSULTATION DATE:  08/10/2014  REFERRING MD :  [facility]  CHIEF COMPLAINT:  Hypotension  BRIEF PATIENT DESCRIPTION: 59 year old male with ESRD whose access is now in his thigh since there are no other areas left to access who was in the HD center today where he started having CP and was brought to the ED.  In the ED he ws noted to be hypotensive and was given a fluid challenge of 100 ml and PCCM was called for hypotension after [facility] has admitted him to the floor.  SIGNIFICANT EVENTS  4/15 admission to SDU for CP and hypotension.  STUDIES:  4/15>>>CXR that I reviewed myself was completely negative.   HISTORY OF PRESENT ILLNESS:  59 year old male with ESRD whose access is now in his thigh since there are no other areas left to access who was in the HD center today where he started having CP and was brought to the ED.  In the ED he ws noted to be hypotensive and was given a fluid challenge of 100 ml and PCCM was called for hypotension after [facility] has admitted him to the floor.  PAST MEDICAL HISTORY :   has a past medical history of Anxiety; Back pain; GERD (gastroesophageal reflux disease); Peripheral neuropathy; Arthritis; Chronic diastolic CHF (congestive heart failure); Active smoker; ESRD on hemodialysis; Diabetes mellitus; Hepatitis (2010); PONV (postoperative nausea and vomiting); Hypertension; Bell palsy; Carpal tunnel syndrome; Palpitations; Headache(784.0); and CAD (coronary artery disease).  has past surgical history that includes Total hip arthroplasty; Thyroidectomy; Tooth extraction; Mandible fracture surgery; dg av dialysis graft declot or; Insertion of dialysis catheter (03/28/2011); AV fistula placement (03/31/2011); Thrombectomy w/ embolectomy (06/02/2011); Insertion of dialysis catheter (08/28/2011); Insertion of dialysis catheter (12/15/2011); Exchange of a dialysis catheter (02/26/2012); Joint replacement;  Exchange of a dialysis catheter (05/18/2012); AV fistula placement (05/18/2012); Thrombectomy w/ embolectomy (Left, 08/25/2012); AV fistula placement (Left, 08/25/2012); Revision of arteriovenous goretex graft (Left, 12/14/2012); Arteriovenous goretex graft removal (Left, 02/16/2013); Revision of arteriovenous goretex graft (Left, 08/08/2013); venogram (Bilateral, 10/27/2011); left heart cath (Bilateral, 05/21/2013); percutaneous coronary stent intervention (pci-s) (05/21/2013); Coronary angioplasty with stent; and Radiology with anesthesia (N/A, 06/27/2014). Prior to Admission medications   Medication Sig Start Date End Date Taking? Authorizing Provider  aspirin 81 MG tablet Take 1 tablet (81 mg total) by mouth daily. 05/24/13   Rhonda G Barrett, PA-C  atorvastatin (LIPITOR) 80 MG tablet Take 1 tablet (80 mg total) by mouth daily at 6 PM. 05/24/13   Rhonda G Barrett, PA-C  b complex-vitamin c-folic acid (NEPHRO-VITE) 0.8 MG TABS Take 0.8 mg by mouth at bedtime.     Historical Provider, MD  calcium carbonate (TUMS - DOSED IN MG ELEMENTAL CALCIUM) 500 MG chewable tablet Chew 3 tablets by mouth 3 (three) times daily.    Historical Provider, MD  colchicine 0.6 MG tablet Take 0.6 mg by mouth 2 (two) times daily as needed. For gout    Historical Provider, MD  fentaNYL (DURAGESIC - DOSED MCG/HR) 50 MCG/HR Apply one patch topically every 72 hours for pain 07/17/14   Oneal Grout, MD  gabapentin (NEURONTIN) 100 MG capsule Take 1 capsule by mouth 2 (two) times daily as needed. 12/29/13   Historical Provider, MD  latanoprost (XALATAN) 0.005 % ophthalmic solution Place 1 drop into both eyes at bedtime.  04/20/14   Historical Provider, MD  loperamide (IMODIUM) 2 MG capsule Take 4 mg by  mouth as needed for diarrhea or loose stools.    Historical Provider, MD  naproxen sodium (ANAPROX) 220 MG tablet Take 440 mg by mouth 2 (two) times daily with a meal.    Historical Provider, MD  nitroGLYCERIN (NITROSTAT) 0.4 MG SL tablet Place 1  tablet (0.4 mg total) under the tongue every 5 (five) minutes as needed for chest pain. 05/24/13   Rhonda G Barrett, PA-C  omeprazole (PRILOSEC) 40 MG capsule Take 40 mg by mouth daily.    Historical Provider, MD  oxyCODONE (OXY IR/ROXICODONE) 5 MG immediate release tablet Take 1-2 tablets (5-10 mg total) by mouth every 4 (four) hours as needed for moderate pain. 07/17/14   Oneal Grout, MD  prasugrel (EFFIENT) 10 MG TABS tablet Take 1 tablet (10 mg total) by mouth daily. 06/16/13   Chrystie Nose, MD  sodium bicarbonate 650 MG tablet Take 650 mg by mouth 2 (two) times daily.      Historical Provider, MD  sulindac (CLINORIL) 200 MG tablet Take 200 mg by mouth 2 (two) times daily.    Historical Provider, MD  traZODone (DESYREL) 50 MG tablet Take 50 mg by mouth at bedtime as needed for sleep.  03/06/13   Historical Provider, MD   No Known Allergies  FAMILY HISTORY:  family history includes Diabetes in his mother; Heart disease in his mother; Hyperlipidemia in his father; Kidney disease in his father; Stroke in his mother. SOCIAL HISTORY:  reports that he has been smoking Cigarettes.  He has a 8 pack-year smoking history. He has never used smokeless tobacco. He reports that he does not drink alcohol or use illicit drugs.  REVIEW OF SYSTEMS:   Constitutional: Negative for fever, chills, weight loss, malaise/fatigue and diaphoresis.  HENT: Negative for hearing loss, ear pain, nosebleeds, congestion, sore throat, neck pain, tinnitus and ear discharge.   Eyes: Negative for blurred vision, double vision, photophobia, pain, discharge and redness.  Respiratory: Negative for cough, hemoptysis, sputum production, shortness of breath, wheezing and stridor.   Cardiovascular: Negative for chest pain, palpitations, orthopnea, claudication, leg swelling and PND.  Gastrointestinal: Negative for heartburn, nausea, vomiting, abdominal pain, diarrhea, constipation, blood in stool and melena.  Genitourinary: Negative  for dysuria, urgency, frequency, hematuria and flank pain.  Musculoskeletal: Negative for joint pain and falls. Positive for leg pain and back pain. Skin: Negative for rash. Positive for itching at the dialysis site. Neurological: Negative for dizziness, tingling, tremors, sensory change, speech change, focal weakness, seizures, loss of consciousness, weakness and headaches.  Endo/Heme/Allergies: Negative for environmental allergies and polydipsia. Does not bruise/bleed easily.  SUBJECTIVE:   VITAL SIGNS: Temp:  [98 F (36.7 C)] 98 F (36.7 C) (04/15 1225) Pulse Rate:  [89-117] 109 (04/15 1630) Resp:  [17-25] 20 (04/15 1630) BP: (89-165)/(43-95) 91/45 mmHg (04/15 1630) SpO2:  [93 %-98 %] 97 % (04/15 1630)  PHYSICAL EXAMINATION: General:  Chronically ill appearing male, resting in exam bed, mildly confused but able to answer questions. Neuro:  Awake and interactive but slow to response. Head: [place]/AT EENT:  PERRL, EOM-I and MMM. Cardiovascular:  RRR, Nl S1/S2, -M/R/G. Lungs:  Decrease BS at the bases. Abdomen:  Soft, NT, ND and +BS. Musculoskeletal:  -edema and -tenderness. Skin:  Diffuse scaring throughout the neck, chest, groin and now legs from dialysis access.   Recent Labs Lab 08/10/14 1248  NA 135  K 5.2*  CL 95*  CO2 28  BUN 23  CREATININE 4.60*  GLUCOSE 90    Recent Labs  Lab 08/10/14 1248  HGB 12.1*  HCT 37.7*  WBC 14.8*  PLT 253   Dg Chest 1 View  08/10/2014   CLINICAL DATA:  Chronic low back pain, now with hip pain  EXAM: CHEST  1 VIEW  COMPARISON:  06/19/2014  FINDINGS: Lungs are essentially clear. No focal consolidation. No pleural effusion or pneumothorax.  The heart is top-normal in size.  Surgical clips overlying the neck.  IMPRESSION: No evidence of acute cardiopulmonary disease.   Electronically Signed   By: Charline Bills M.D.   On: 08/10/2014 13:43    ASSESSMENT / PLAN:  59 year old chronically debilitated man who lives in a SNF presenting  from HD center with CP and leg pain and back pain.  Troponins only slightly elevated and cardiology is seeing.  PCCM called for hypotension.  Patient's normal BP per him is 100, his lactic acid is only 1.44 in a patient that is ESRD, his fluid challenge was only 100 ml of fluid and now SBP is 91 and patient is talking.  He wishes to be full code but I am afraid that there is no longer a place where we can get access.  His IJ bilaterally are occluded, there scars over both subclavians and femorals.  Access will have to be gotten by vascular surgery at this point.  Hypotension: likely removed too much fluid in dialysis since he is fluid responsive and bare in mind that his normal is 100.  Lactic acid is also normal which makes me unexcited about end organ perfusion issues.  Give him back at least a liter then draw a repeat lactic acid.  If lactic acid is >3.5 then please call PCCM back.  Lack of access:  Patient is likely end stage access at this point.  He is not aware of the extent of condition.  Will likely need to involve palliative care.  If access is needed please call vascular surgery.  CP: troponins mildly positive.  Would not anti-coagulate based on such a trivial rise in troponins.  Repeat and if increases dramatically then may need interventions.  Will consult cardiology.  ?Sepsis: I see no source of infection in this patient.  Check a PCT level and if normal would simply discontinue antibiotics all together.  The only possibility of sepsis is if he had transient bacteremia during dialysis but again that is unusual.  PCCM will sign off, please call back repeat lactate is >3.5 after an adequate fluid challenge.  Alyson Reedy, M.D. [facility] Pulmonary/Critical Care Medicine. Pager: [phone]. After hours pager: [phone].  08/10/2014, 4:56 PM

## 2014-08-10 NOTE — ED Provider Notes (Signed)
CSN: [id number]     Arrival date & time 08/10/14  1217 History   First MD Initiated Contact with Patient 08/10/14 1221     Chief Complaint  Patient presents with  . Weakness     (Consider location/radiation/quality/duration/timing/severity/associated sxs/prior Treatment) HPI  NIL AMIRIAN is a 59 y.o. male with past medical history significant for borderline diabetes, ESRD, CAD (2 stents) complaining of generalized fatigue, productive cough, diffuse myalgia. Patient was dialyzed today, he only had an hour and a half secondary to pain and weakness. She denies chest pain, shortness of breath, increasing peripheral edema, weight gain, rash, dyspnea on exertion, fever, chills. Patient also reports exacerbation of his chronic low back and hip pain, he says his pain is not atypical for him however it is significantly more severe than normal. It is exacerbated by position.   Cards: Hilty  Past Medical History  Diagnosis Date  . Anxiety   . Back pain   . GERD (gastroesophageal reflux disease)   . Peripheral neuropathy   . Arthritis   . CHF (congestive heart failure)   . Active smoker   . ESRD on hemodialysis     [name] 4 days per week on M-Tu-Wed and Fri.  Started HD in 1998 and has been on HD initially at [facility], then went to [facility], then in [facility], then to [facility] and now is at [facility] for last 10 years.  Has L thigh AVG.     . Diabetes mellitus     diet controlled  . 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 PONV (postoperative nausea and vomiting)   . Hypertension   . Bell palsy   . Heart murmur   . Carpal tunnel syndrome     bilateral  . Dysrhythmia     Hx: of palpitataions  . Headache(784.0)     Hx: of Migraines   Past Surgical History  Procedure Laterality Date  . Total hip arthroplasty    . Thyroidectomy    . Tooth extraction    . Mandible fracture surgery    . Dg av dialysis graft declot or    . Insertion of dialysis catheter   03/28/2011    Procedure: INSERTION OF DIALYSIS CATHETER;  Surgeon: Pryor Ochoa, MD;  Location: [facility] OR;  Service: Vascular;  Laterality: Left;  . Av fistula placement  03/31/2011    Procedure: INSERTION OF ARTERIOVENOUS (AV) GORE-TEX GRAFT THIGH;  Surgeon: Sherren Kerns, MD;  Location: [facility] OR;  Service: Vascular;  Laterality: Right;  redo right thigh arteriovenous gortex graft using gore-tex stretch 42mm x 70cm  . Thrombectomy w/ embolectomy  06/02/2011    Procedure: THROMBECTOMY ARTERIOVENOUS GORE-TEX GRAFT;  Surgeon: Chuck Hint, MD;  Location: [facility] OR;  Service: Vascular;  Laterality: Right;  Thrombectomy right thigh arteriovenous gortex graft;  revision by  replacement of large portion of graft with 52mm gore-tex   . Insertion of dialysis catheter  08/28/2011    Procedure: INSERTION OF DIALYSIS CATHETER;  Surgeon: Fransisco Hertz, MD;  Location: [facility] OR;  Service: Vascular;  Laterality: Left;  Atempted Bilateral Internal Jugular, Bilateral Subclavin insertion of 55cm Dialysis Catheter Left Femoral  . Insertion of dialysis catheter  12/15/2011    Procedure: INSERTION OF DIALYSIS CATHETER;  Surgeon: Sherren Kerns, MD;  Location: [facility] OR;  Service: Vascular;  Laterality: Right;  Insertion of Right Femoral Dialysis Catheter  . Exchange of a  dialysis catheter  02/26/2012    Procedure: EXCHANGE OF A DIALYSIS CATHETER;  Surgeon: Larina Earthly, MD;  Location: [facility] OR;  Service: Vascular;  Laterality: Right;  . Joint replacement    . Exchange of a dialysis catheter  05/18/2012    Procedure: EXCHANGE OF A DIALYSIS CATHETER;  Surgeon: Larina Earthly, MD;  Location: [facility] OR;  Service: Vascular;  Laterality: Right;  right femoral dialysis catheter  . Av fistula placement  05/18/2012    Procedure: INSERTION OF ARTERIOVENOUS (AV) GORE-TEX GRAFT THIGH;  Surgeon: Larina Earthly, MD;  Location: [facility] OR;  Service: Vascular;  Laterality: Left;  using 6mm by 70cm goretex graft  . Thrombectomy w/ embolectomy Left 08/25/2012     Procedure: THROMBECTOMY ARTERIOVENOUS GORE-TEX GRAFT;  Surgeon: Pryor Ochoa, MD;  Location: [facility] OR;  Service: Vascular;  Laterality: Left;  Attempted thrombectomy of left thigh arteriovenous gortex graft.   . Av fistula placement Left 08/25/2012    Procedure: INSERTION OF ARTERIOVENOUS (AV) GORE-TEX GRAFT THIGH;  Surgeon: Pryor Ochoa, MD;  Location: [facility] OR;  Service: Vascular;  Laterality: Left;  Using 6mm x 40cm vascular Gortex graft.  . Revision of arteriovenous goretex graft Left 12/14/2012    Procedure: Revision of Left Thigh Graft;  Surgeon: Larina Earthly, MD;  Location: [facility] OR;  Service: Vascular;  Laterality: Left;  . Avgg removal Left 02/16/2013    Procedure: EXCISION OF LEFT ARM ARTERIOVENOUS GORETEX GRAFT TIMES 2 WITH VEIN PATCH ANGIOPLASTY OF BRACIAL ARTERY.  ;  Surgeon: Chuck Hint, MD;  Location: [facility] OR;  Service: Vascular;  Laterality: Left;  Converted from MAC to General.    . Revision of arteriovenous goretex graft Left 08/08/2013    Procedure: REVISION OF LEFT THIGH ARTERIOVENOUS GORETEX GRAFT;  Surgeon: Sherren Kerns, MD;  Location: [facility] OR;  Service: Vascular;  Laterality: Left;  . Venogram Bilateral 10/27/2011    Procedure: VENOGRAM;  Surgeon: Nada Libman, MD;  Location: [facility] CATH LAB;  Service: Cardiovascular;  Laterality: Bilateral;  bilat upper extrem venograms  . Left heart cath Bilateral 05/21/2013    Procedure: LEFT HEART CATH;  Surgeon: Iran Ouch, MD;  Location: [facility] CATH LAB;  Service: Cardiovascular;  Laterality: Bilateral;  . Percutaneous coronary stent intervention (pci-s)  05/21/2013    Procedure: PERCUTANEOUS CORONARY STENT INTERVENTION (PCI-S);  Surgeon: Iran Ouch, MD;  Location: [facility] CATH LAB;  Service: Cardiovascular;;  . Coronary angioplasty with stent placement    . Radiology with anesthesia N/A 06/27/2014    Procedure: MRI LUMBER WITHOUT CONTRAST;  Surgeon: Medication Radiologist, MD;  Location: [facility] OR;  Service: Radiology;  Laterality: N/A;    Family History  Problem Relation Age of Onset  . Diabetes Mother   . Stroke Mother   . Heart disease Mother   . Kidney disease Father   . Hyperlipidemia Father    History  Substance Use Topics  . Smoking status: Current Every Day Smoker -- 0.20 packs/day for 40 years    Types: Cigarettes  . Smokeless tobacco: Never Used  . Alcohol Use: No    Review of Systems  10 systems reviewed and found to be negative, except as noted in the HPI.   Allergies  Review of patient's allergies indicates no known allergies.  Home Medications   Prior to Admission medications   Medication Sig Start Date End Date Taking? Authorizing Provider  aspirin 81 MG tablet Take 1 tablet (81 mg total) by mouth daily. 05/24/13  Joline Salt Barrett, PA-C  atorvastatin (LIPITOR) 80 MG tablet Take 1 tablet (80 mg total) by mouth daily at 6 PM. 05/24/13   Rhonda G Barrett, PA-C  b complex-vitamin c-folic acid (NEPHRO-VITE) 0.8 MG TABS Take 0.8 mg by mouth at bedtime.     Historical Provider, MD  calcium carbonate (TUMS - DOSED IN MG ELEMENTAL CALCIUM) 500 MG chewable tablet Chew 3 tablets by mouth 3 (three) times daily.    Historical Provider, MD  colchicine 0.6 MG tablet Take 0.6 mg by mouth 2 (two) times daily as needed. For gout    Historical Provider, MD  fentaNYL (DURAGESIC - DOSED MCG/HR) 50 MCG/HR Apply one patch topically every 72 hours for pain 07/17/14   Oneal Grout, MD  gabapentin (NEURONTIN) 100 MG capsule Take 1 capsule by mouth 2 (two) times daily as needed. 12/29/13   Historical Provider, MD  latanoprost (XALATAN) 0.005 % ophthalmic solution Place 1 drop into both eyes at bedtime.  04/20/14   Historical Provider, MD  loperamide (IMODIUM) 2 MG capsule Take 4 mg by mouth as needed for diarrhea or loose stools.    Historical Provider, MD  naproxen sodium (ANAPROX) 220 MG tablet Take 440 mg by mouth 2 (two) times daily with a meal.    Historical Provider, MD  nitroGLYCERIN (NITROSTAT) 0.4 MG SL tablet Place  1 tablet (0.4 mg total) under the tongue every 5 (five) minutes as needed for chest pain. 05/24/13   Rhonda G Barrett, PA-C  omeprazole (PRILOSEC) 40 MG capsule Take 40 mg by mouth daily.    Historical Provider, MD  oxyCODONE (OXY IR/ROXICODONE) 5 MG immediate release tablet Take 1-2 tablets (5-10 mg total) by mouth every 4 (four) hours as needed for moderate pain. 07/17/14   Oneal Grout, MD  prasugrel (EFFIENT) 10 MG TABS tablet Take 1 tablet (10 mg total) by mouth daily. 06/16/13   Chrystie Nose, MD  sodium bicarbonate 650 MG tablet Take 650 mg by mouth 2 (two) times daily.      Historical Provider, MD  sulindac (CLINORIL) 200 MG tablet Take 200 mg by mouth 2 (two) times daily.    Historical Provider, MD  traZODone (DESYREL) 50 MG tablet Take 50 mg by mouth at bedtime as needed for sleep.  03/06/13   Historical Provider, MD   BP 137/76 mmHg  Pulse 106  Temp(Src) 98 F (36.7 C) (Oral)  Resp 18  SpO2 96% Physical Exam  Constitutional: He is oriented to person, place, and time. He appears well-developed and well-nourished.  Appears weak, eyes closed, responsive to voice  HENT:  Head: Normocephalic and atraumatic.  Mouth/Throat: Oropharynx is clear and moist.  Eyes: Conjunctivae and EOM are normal. Pupils are equal, round, and reactive to light.  Neck: Normal range of motion.  Cardiovascular: Normal rate, regular rhythm and intact distal pulses.   Multiple nonfunctional fistulas and grafts to bilateral upper extremities, patient has fistula 2 right 5 with good thrill  Pulmonary/Chest: Effort normal and breath sounds normal. No stridor. No respiratory distress. He has no wheezes. He has no rales. He exhibits no tenderness.  Abdominal: Soft. Bowel sounds are normal. He exhibits no distension and no mass. There is no tenderness. There is no rebound and no guarding.  Musculoskeletal: Normal range of motion. He exhibits no edema or tenderness.  Neurological: He is alert and oriented to person,  place, and time. No cranial nerve deficit.  Psychiatric: He has a normal mood and affect.  Nursing note and vitals  reviewed.   ED Course  Procedures (including critical care time) Labs Review Labs Reviewed  CBC WITH DIFFERENTIAL/PLATELET - Abnormal; Notable for the following:    WBC 14.8 (*)    RBC 3.94 (*)    Hemoglobin 12.1 (*)    HCT 37.7 (*)    RDW 16.7 (*)    Neutrophils Relative % 91 (*)    Neutro Abs 13.5 (*)    Lymphocytes Relative 3 (*)    Lymphs Abs 0.4 (*)    Monocytes Relative 2 (*)    All other components within normal limits  COMPREHENSIVE METABOLIC PANEL - Abnormal; Notable for the following:    Potassium 5.2 (*)    Chloride 95 (*)    Creatinine, Ser 4.60 (*)    Albumin 2.5 (*)    AST 1223 (*)    ALT 235 (*)    Alkaline Phosphatase 248 (*)    Total Bilirubin 2.7 (*)    GFR calc non Af Amer 13 (*)    GFR calc Af Amer 15 (*)    All other components within normal limits  APTT - Abnormal; Notable for the following:    aPTT 49 (*)    All other components within normal limits  I-STAT TROPOININ, ED - Abnormal; Notable for the following:    Troponin i, poc 0.19 (*)    All other components within normal limits  PROTIME-INR  I-STAT CG4 LACTIC ACID, ED    Imaging Review Dg Chest 1 View  08/10/2014   CLINICAL DATA:  Chronic low back pain, now with hip pain  EXAM: CHEST  1 VIEW  COMPARISON:  06/19/2014  FINDINGS: Lungs are essentially clear. No focal consolidation. No pleural effusion or pneumothorax.  The heart is top-normal in size.  Surgical clips overlying the neck.  IMPRESSION: No evidence of acute cardiopulmonary disease.   Electronically Signed   By: Charline Bills M.D.   On: 08/10/2014 13:43     EKG Interpretation   Date/Time:  Friday August 10 2014 12:21:37 EDT Ventricular Rate:  87 PR Interval:  150 QRS Duration: 106 QT Interval:  408 QTC Calculation: 491 R Axis:   64 Text Interpretation:  Sinus rhythm Low voltage, precordial leads  Nonspecific  ST and T wave abnormality Lateral leads Borderline prolonged  QT interval Baseline wander When compared with ECG of 06/24/2014 QT  prolonged and Nonspecific ST and T wave abnormality is now Present  Confirmed by [facility]  MD, KATHLEEN [phone]) on 08/10/2014 12:28:55 PM      EKG Interpretation  Date/Time:  Friday August 10 2014 14:15:58 EDT Ventricular Rate:  106 PR Interval:  159 QRS Duration: 106 QT Interval:  333 QTC Calculation: 442 R Axis:   74 Text Interpretation:  Sinus tachycardia Multiple ventricular premature complexes Repol abnrm suggests ischemia, lateral leads ST more depressed Lateral leads Since last tracing of earlier today Confirmed by [facility]  MD, Nicholos Johns [phone]) on 08/10/2014 2:30:23 PM       MDM   Final diagnoses:  Cough  NSTEMI (non-ST elevated myocardial infarction)  Abnormal EKG    Filed Vitals:   08/10/14 1300 08/10/14 1315 08/10/14 1347 08/10/14 1401  BP: 142/95 165/72 113/63 137/76  Pulse: 90 100 106   Temp:      TempSrc:      Resp: 22 20 18    SpO2: 94% 94% 96%     Medications  0.9 %  sodium chloride infusion ( Intravenous New Bag/Given 08/10/14 1257)  ondansetron (ZOFRAN) injection 4 mg (  4 mg Intravenous Given 08/10/14 1259)  morphine 4 MG/ML injection 4 mg (4 mg Intravenous Given 08/10/14 1300)  aspirin chewable tablet 324 mg (324 mg Oral Given 08/10/14 1252)    [name] is a pleasant 59 y.o. male presenting with generalized fatigue, productive cough, diffuse myalgia worsening over the course of one week. Patient is afebrile, mildly tachycardic completed 2 hours if his normal hemodialysis this morning. EKG shows nonspecific T wave changes in the lateral leads. One view chest x-ray is negative. His i-STAT troponin is elevated 0.19. Again checked with patient and he confirms no chest pain, no shortness of breath nausea or vomiting. Vision has history of STEMI with stents (was not CABG Candidate). Paged cardiology who will evaluate the patient, given  full dose aspirin upon arrival. Labs reveal a mild cytosis of 14.8, he also has a transaminitis with AST of 1200 and ALT of 200, alkaline phosphatase 250 and T bili is 2.7. K is mildly elevated at 5.2, discussed with attending, will not intervene at this time.  Patient comes back from x-ray in severe pain to right low back. States that he's had this pain in the past but never this severe, he states that laying on his side improves it. Blood pressures in bilateral arms show some asymmetry, it is unclear if this is from his fistula, difference of about 20 mmHg at least consistently.  02:19 PM Repeat EKG shows clear ST depression in the lateral leads, no reciprocal changes, will re-page cards to discuss the dynamic changes  Dr. Clarise Cruz has evaluated the patient, he recommends medical admission and cardiac rule out. Patient was admitted to family practice in February, I discussed the case with them and they state that as it is not technically a bounce back and this patient is not followed in our clinic as an unassigned admission.  Case discussed with triad hospitalist Junious Silk, they will admit the patient to a step down bed.  Wynetta Emery, PA-C 08/10/14 1629  Samuel Jester, DO 08/11/14 [phone]

## 2014-08-10 NOTE — ED Notes (Signed)
Performed orthostatic vitals. Patient says he is unable to move or stand on his legs. Lying and sitting vitals were obtained.

## 2014-08-10 NOTE — Progress Notes (Signed)
ANTIBIOTIC CONSULT NOTE - INITIAL  Pharmacy Consult for Zosyn Indication: rule out sepsis  No Known Allergies  Patient Measurements:   Vital Signs: Temp: 98 F (36.7 C) (04/15 1225) Temp Source: Oral (04/15 1225) BP: 91/45 mmHg (04/15 1630) Pulse Rate: 109 (04/15 1630) Intake/Output from previous day:   Intake/Output from this shift:    Labs:  Recent Labs  08/10/14 1248  WBC 14.8*  HGB 12.1*  PLT 253  CREATININE 4.60*   CrCl cannot be calculated (Unknown ideal weight.). No results for input(s): VANCOTROUGH, VANCOPEAK, VANCORANDOM, GENTTROUGH, GENTPEAK, GENTRANDOM, TOBRATROUGH, TOBRAPEAK, TOBRARND, AMIKACINPEAK, AMIKACINTROU, AMIKACIN in the last 72 hours.   Microbiology: No results found for this or any previous visit (from the past 720 hour(s)).  Medical History: Past Medical History  Diagnosis Date  . Anxiety   . Back pain   . GERD (gastroesophageal reflux disease)   . Peripheral neuropathy   . Arthritis   . Chronic diastolic CHF (congestive heart failure)     a. 04/2013 Echo: EF nl, no rwma, mild LVH.  Marland Kitchen Active smoker   . ESRD on hemodialysis     [name] 4 days per week on M-Tu-Wed and Fri.  Started HD in 1998 and has been on HD initially at [facility], then went to [facility], then in [facility], then to [facility] and now is at [facility] for last 10 years.  Has L thigh AVG.     . Diabetes mellitus     diet controlled  . 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 PONV (postoperative nausea and vomiting)   . Hypertension   . Bell palsy   . Carpal tunnel syndrome     bilateral  . Palpitations   . Headache(784.0)     Hx: of Migraines  . CAD (coronary artery disease)     a. 04/2013 Ant STEMI/PCI: LM nl, LAD 95p (3.0x18 Xience DES), D1/2/3 small, LCX 80ost, RI 20p, RCA 16m CTO (unsuccessful PCI), EF 55%, mod basal inf HK.    Medications:  Anti-infectives    Start     Dose/Rate Route Frequency Ordered Stop   08/10/14 1700   piperacillin-tazobactam (ZOSYN) IVPB 2.25 g     2.25 g 100 mL/hr over 30 Minutes Intravenous 3 times per day 08/10/14 1646       Assessment: 81 yoM with ESRD on HD MTuWF presents from dialysis with generalized weakness, body aches, and chest pain. Of note, patient only completed 30 min of HD on Fri. Pharmacy consulted to dose Zosyn for sepsis. Per internal medicine, suspecting cholangitis, therefore vancomycin not necessary. Per CCM, no clear source of infection and may consider discontinuing antibiotics. Afebrile, WBC 14.8.   4/15 BCx2 >>  Zosyn 4/15 >>  Goal of Therapy:  Clinical resolution of infection  Plan:  Zosyn 2.25 g IV q8h Follow up C&S, clinical progress, length of therapy  Derwood Kaplan 08/10/2014,4:55 PM

## 2014-08-10 NOTE — Progress Notes (Addendum)
Pt bp manual was 84/54, asymptomatic.  Dr. Mahala Menghini notified and telephone orders received to bolus pt with NS 250 mL and to notify house coverage tonight if BP falls below 80/50.  Will continue to closely monitor.

## 2014-08-10 NOTE — ED Notes (Signed)
Cardiology at bedside.

## 2014-08-10 NOTE — H&P (Signed)
[facility] History and Physical                                                                                    [name], is a 59 y.o. male  MRN: [id number]   DOB - [birth year]  Admit Date - 08/10/2014  Outpatient Primary MD for the patient is Dyke Maes, MD  With History of -  Past Medical History  Diagnosis Date  . Anxiety   . Back pain   . GERD (gastroesophageal reflux disease)   . Peripheral neuropathy   . Arthritis   . Chronic diastolic CHF (congestive heart failure)     a. 04/2013 Echo: EF nl, no rwma, mild LVH.  Marland Kitchen Active smoker   . ESRD on hemodialysis     [name] 4 days per week on M-Tu-Wed and Fri.  Started HD in 1998 and has been on HD initially at [facility], then went to [facility], then in [facility], then to [facility] and now is at [facility] for last 10 years.  Has L thigh AVG.     . Diabetes mellitus     diet controlled  . 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 PONV (postoperative nausea and vomiting)   . Hypertension   . Bell palsy   . Carpal tunnel syndrome     bilateral  . Palpitations   . Headache(784.0)     Hx: of Migraines  . CAD (coronary artery disease)     a. 04/2013 Ant STEMI/PCI: LM nl, LAD 95p (3.0x18 Xience DES), D1/2/3 small, LCX 80ost, RI 20p, RCA 126m CTO (unsuccessful PCI), EF 55%, mod basal inf HK.      Past Surgical History  Procedure Laterality Date  . Total hip arthroplasty    . Thyroidectomy    . Tooth extraction    . Mandible fracture surgery    . Dg av dialysis graft declot or    . Insertion of dialysis catheter  03/28/2011    Procedure: INSERTION OF DIALYSIS CATHETER;  Surgeon: Pryor Ochoa, MD;  Location: [facility] OR;  Service: Vascular;  Laterality: Left;  . Av fistula placement  03/31/2011    Procedure: INSERTION OF ARTERIOVENOUS (AV) GORE-TEX GRAFT THIGH;  Surgeon: Sherren Kerns, MD;  Location: [facility] OR;  Service: Vascular;  Laterality: Right;  redo right thigh arteriovenous gortex graft using  gore-tex stretch 6mm x 70cm  . Thrombectomy w/ embolectomy  06/02/2011    Procedure: THROMBECTOMY ARTERIOVENOUS GORE-TEX GRAFT;  Surgeon: Chuck Hint, MD;  Location: [facility] OR;  Service: Vascular;  Laterality: Right;  Thrombectomy right thigh arteriovenous gortex graft;  revision by  replacement of large portion of graft with 7mm gore-tex   . Insertion of dialysis catheter  08/28/2011    Procedure: INSERTION OF DIALYSIS CATHETER;  Surgeon: Fransisco Hertz, MD;  Location: [facility] OR;  Service: Vascular;  Laterality: Left;  Atempted Bilateral Internal Jugular, Bilateral Subclavin insertion of 55cm Dialysis Catheter Left Femoral  . Insertion of dialysis catheter  12/15/2011    Procedure: INSERTION OF DIALYSIS CATHETER;  Surgeon: Sherren Kerns, MD;  Location: [facility] OR;  Service: Vascular;  Laterality: Right;  Insertion of Right Femoral Dialysis Catheter  . Exchange of a dialysis catheter  02/26/2012    Procedure: EXCHANGE OF A DIALYSIS CATHETER;  Surgeon: Larina Earthly, MD;  Location: [facility] OR;  Service: Vascular;  Laterality: Right;  . Joint replacement    . Exchange of a dialysis catheter  05/18/2012    Procedure: EXCHANGE OF A DIALYSIS CATHETER;  Surgeon: Larina Earthly, MD;  Location: [facility] OR;  Service: Vascular;  Laterality: Right;  right femoral dialysis catheter  . Av fistula placement  05/18/2012    Procedure: INSERTION OF ARTERIOVENOUS (AV) GORE-TEX GRAFT THIGH;  Surgeon: Larina Earthly, MD;  Location: [facility] OR;  Service: Vascular;  Laterality: Left;  using 51mm by 70cm goretex graft  . Thrombectomy w/ embolectomy Left 08/25/2012    Procedure: THROMBECTOMY ARTERIOVENOUS GORE-TEX GRAFT;  Surgeon: Pryor Ochoa, MD;  Location: [facility] OR;  Service: Vascular;  Laterality: Left;  Attempted thrombectomy of left thigh arteriovenous gortex graft.   . Av fistula placement Left 08/25/2012    Procedure: INSERTION OF ARTERIOVENOUS (AV) GORE-TEX GRAFT THIGH;  Surgeon: Pryor Ochoa, MD;  Location: [facility] OR;  Service: Vascular;  Laterality:  Left;  Using 72mm x 40cm vascular Gortex graft.  . Revision of arteriovenous goretex graft Left 12/14/2012    Procedure: Revision of Left Thigh Graft;  Surgeon: Larina Earthly, MD;  Location: [facility] OR;  Service: Vascular;  Laterality: Left;  . Avgg removal Left 02/16/2013    Procedure: EXCISION OF LEFT ARM ARTERIOVENOUS GORETEX GRAFT TIMES 2 WITH VEIN PATCH ANGIOPLASTY OF BRACIAL ARTERY.  ;  Surgeon: Chuck Hint, MD;  Location: [facility] OR;  Service: Vascular;  Laterality: Left;  Converted from MAC to General.    . Revision of arteriovenous goretex graft Left 08/08/2013    Procedure: REVISION OF LEFT THIGH ARTERIOVENOUS GORETEX GRAFT;  Surgeon: Sherren Kerns, MD;  Location: [facility] OR;  Service: Vascular;  Laterality: Left;  . Venogram Bilateral 10/27/2011    Procedure: VENOGRAM;  Surgeon: Nada Libman, MD;  Location: [facility] CATH LAB;  Service: Cardiovascular;  Laterality: Bilateral;  bilat upper extrem venograms  . Left heart cath Bilateral 05/21/2013    Procedure: LEFT HEART CATH;  Surgeon: Iran Ouch, MD;  Location: [facility] CATH LAB;  Service: Cardiovascular;  Laterality: Bilateral;  . Percutaneous coronary stent intervention (pci-s)  05/21/2013    Procedure: PERCUTANEOUS CORONARY STENT INTERVENTION (PCI-S);  Surgeon: Iran Ouch, MD;  Location: [facility] CATH LAB;  Service: Cardiovascular;;  . Coronary angioplasty with stent placement    . Radiology with anesthesia N/A 06/27/2014    Procedure: MRI LUMBER WITHOUT CONTRAST;  Surgeon: Medication Radiologist, MD;  Location: [facility] OR;  Service: Radiology;  Laterality: N/A;    in for   Chief Complaint  Patient presents with  . Weakness     HPI This 59 year old male patient with chronic kidney disease on dialysis, known 3 vessel coronary artery disease status post drug-eluting stent to LAD, diabetes with neuropathy, hypertension, morbid obesity, dyslipidemia, known chronic low back pain with right lower extremity radiculopathy. He was sent to the ER today with  a litany of complaints. Patient is a very poor historian and has been difficult to obtain an accurate history from this gentleman. When he was initially evaluated by the emergency room staff he was reporting primary back pain which was very significant to the point he could not tolerate dialysis so dialysis was discontinued early  and patient sent to the emergency room. He did have an abnormal troponin which was only mildly elevated as well as some EKG changes with apparent ST depression in the lateral leads so this prompted a cardiology consultation. When the patient was evaluated by the cardiology physician assistant he was now reporting chest pain started during dialysis. He told the provider that his chest pain was 9/10 in the ER and was associated with shortness of breath and was typical of his previous angina although appeared to be more severe. While in the ER he received aspirin 324 mg, but now M morphine and Zofran. He continued to have the same pain in his back and right hip but denied nausea.   When the patient initially presented to the ER he was afebrile with a blood pressure of 142/95 and a heart rate of 90, his room air saturations were 94%. Since that time his blood pressure has trended downward and has averaged around 95/50. Blood pressure has decreased as low as 78/34 and while we were examining the patient blood pressure dipped down to 85 systolic. This prompted [place] to give a 250 cc normal saline bolus. In review of his laboratory data again his troponins were only mildly positive at 0.19 and 0.12 which is likely reflective of his underlying chronic kidney disease. His potassium was 5.2 and his creatinine was 4.6 noting he did not complete dialysis today. Were concerning was abnormal transaminases: [place] phosphatase 248, albumin 2.5, AST 1223, ALT 235 and total bilirubin 2.7. Lactic acid was 1.44 and repeat was 1.8. Patient had a leukocytosis with white count of 14,800 with neutrophils 91%. We  once again questioned the patient now concerned patient may have a biliary etiology to his pain and his leukocytosis. He reports that about 2-3 weeks ago he may have had some vague right upper quadrant abdominal pain. He has not had any diarrhea. He reports he had nausea ~ one week ago and has been unable to eat. He initially described this as an itchy feeling in the back that started after eating but then also reported that when he does swallow the food down it makes him sick on his stomach and makes his stomach hurt but in no specific area. Because of his back pain he's been taking 2 Tylenol pills a day for at least 6 months but was unclear the actual dosage of this stating this was given to him at the dialysis Center. The primary complaint we have experienced with him during our exam was generalized weakness in the arms and legs as well as radicular pain from the mid back down both legs. He also reported the pain was significant enough he could not breathe. EKG as noted above with possible unfair lateral ST segment changes. Chest x-ray showed no acute changes.  Review of Systems   In addition to the HPI above,  No Fever-chills or other constitutional symptoms No Headache, changes with Vision or hearing, NEW weakness, tingling, numbness in any extremity, No Cough or Shortness of Breath, palpitations, orthopnea or DOE No emesis, no melena or hematochezia, no dark tarry stools No dysuria, hematuria or flank pain No new skin rashes, lesions, masses or bruises, No recent weight gain or loss No polyuria, polydypsia or polyphagia,  *A full 10 point Review of Systems was done, except as stated above, all other Review of Systems were negative.  Social History History  Substance Use Topics  . Smoking status: Current Every Day Smoker -- 0.20 packs/day for 40  years    Types: Cigarettes  . Smokeless tobacco: Never Used  . Alcohol Use: No    Family History Family History  Problem Relation Age of Onset   . Diabetes Mother   . Stroke Mother   . Heart disease Mother   . Kidney disease Father   . Hyperlipidemia Father     Prior to Admission medications   Medication Sig Start Date End Date Taking? Authorizing Provider  aspirin 81 MG tablet Take 1 tablet (81 mg total) by mouth daily. 05/24/13   Rhonda G Barrett, PA-C  atorvastatin (LIPITOR) 80 MG tablet Take 1 tablet (80 mg total) by mouth daily at 6 PM. 05/24/13   Rhonda G Barrett, PA-C  b complex-vitamin c-folic acid (NEPHRO-VITE) 0.8 MG TABS Take 0.8 mg by mouth at bedtime.     Historical Provider, MD  calcium carbonate (TUMS - DOSED IN MG ELEMENTAL CALCIUM) 500 MG chewable tablet Chew 3 tablets by mouth 3 (three) times daily.    Historical Provider, MD  colchicine 0.6 MG tablet Take 0.6 mg by mouth 2 (two) times daily as needed. For gout    Historical Provider, MD  fentaNYL (DURAGESIC - DOSED MCG/HR) 50 MCG/HR Apply one patch topically every 72 hours for pain 07/17/14   Oneal Grout, MD  gabapentin (NEURONTIN) 100 MG capsule Take 1 capsule by mouth 2 (two) times daily as needed. 12/29/13   Historical Provider, MD  latanoprost (XALATAN) 0.005 % ophthalmic solution Place 1 drop into both eyes at bedtime.  04/20/14   Historical Provider, MD  loperamide (IMODIUM) 2 MG capsule Take 4 mg by mouth as needed for diarrhea or loose stools.    Historical Provider, MD  naproxen sodium (ANAPROX) 220 MG tablet Take 440 mg by mouth 2 (two) times daily with a meal.    Historical Provider, MD  nitroGLYCERIN (NITROSTAT) 0.4 MG SL tablet Place 1 tablet (0.4 mg total) under the tongue every 5 (five) minutes as needed for chest pain. 05/24/13   Rhonda G Barrett, PA-C  omeprazole (PRILOSEC) 40 MG capsule Take 40 mg by mouth daily.    Historical Provider, MD  oxyCODONE (OXY IR/ROXICODONE) 5 MG immediate release tablet Take 1-2 tablets (5-10 mg total) by mouth every 4 (four) hours as needed for moderate pain. 07/17/14   Oneal Grout, MD  prasugrel (EFFIENT) 10 MG TABS  tablet Take 1 tablet (10 mg total) by mouth daily. 06/16/13   Chrystie Nose, MD  sodium bicarbonate 650 MG tablet Take 650 mg by mouth 2 (two) times daily.      Historical Provider, MD  sulindac (CLINORIL) 200 MG tablet Take 200 mg by mouth 2 (two) times daily.    Historical Provider, MD  traZODone (DESYREL) 50 MG tablet Take 50 mg by mouth at bedtime as needed for sleep.  03/06/13   Historical Provider, MD    No Known Allergies  Physical Exam  Vitals  Blood pressure 96/56, pulse 112, temperature 98 F (36.7 C), temperature source Oral, resp. rate 20, height 5\' 11"  (1.803 m), weight 251 lb 1.6 oz (113.898 kg), SpO2 88 %.   General:  In mild distress as evidenced by restlessness, continued diffuse pain and overall metabolic encephalopathic appearance  Psych:  Flat affect, oriented at least to name and place; poor historian  Neuro:   No focal neurological deficits, CN II through XII intact, Strength 4/5 all 4 extremities, Sensation intact all 4 extremities.  ENT:  Ears and Eyes appear Normal, Conjunctivae clear, PER. Moist  oral mucosa without erythema or exudates.  Neck:  Supple, No lymphadenopathy appreciated  Respiratory:  Symmetrical chest wall movement, Good air movement bilaterally, CTAB. Room Air  Cardiac:  RRR, No Murmurs, no LE edema noted, no JVD, No carotid bruits, peripheral pulses palpable at 2+  Abdomen:  Positive bowel sounds, Soft, mildly tender right upper quadrant that not consistent, Non distended,  No masses appreciated, no obvious hepatosplenomegaly  Skin:  No Cyanosis, Normal Skin Turgor, No Skin Rash or Bruise.  Extremities: Symmetrical without obvious trauma or injury,  no effusions.  Data Review  CBC  Recent Labs Lab 08/10/14 1248  WBC 14.8*  HGB 12.1*  HCT 37.7*  PLT 253  MCV 95.7  MCH 30.7  MCHC 32.1  RDW 16.7*  LYMPHSABS 0.4*  MONOABS 0.3  EOSABS 0.6  BASOSABS 0.0    Chemistries   Recent Labs Lab 08/10/14 1248  NA 135  K 5.2*    CL 95*  CO2 28  GLUCOSE 90  BUN 23  CREATININE 4.60*  CALCIUM 9.7  AST 1223*  ALT 235*  ALKPHOS 248*  BILITOT 2.7*    estimated creatinine clearance is 22.2 mL/min (by C-G formula based on Cr of 4.6).  No results for input(s): TSH, T4TOTAL, T3FREE, THYROIDAB in the last 72 hours.  Invalid input(s): FREET3  Coagulation profile  Recent Labs Lab 08/10/14 1248  INR 1.11    No results for input(s): DDIMER in the last 72 hours.  Cardiac Enzymes  Recent Labs Lab 08/10/14 1437  TROPONINI 0.12*    Invalid input(s): POCBNP  Urinalysis No results found for: COLORURINE, APPEARANCEUR, LABSPEC, PHURINE, GLUCOSEU, HGBUR, BILIRUBINUR, KETONESUR, PROTEINUR, UROBILINOGEN, NITRITE, LEUKOCYTESUR  Imaging results:   Dg Chest 1 View  08/10/2014   CLINICAL DATA:  Chronic low back pain, now with hip pain  EXAM: CHEST  1 VIEW  COMPARISON:  06/19/2014  FINDINGS: Lungs are essentially clear. No focal consolidation. No pleural effusion or pneumothorax.  The heart is top-normal in size.  Surgical clips overlying the neck.  IMPRESSION: No evidence of acute cardiopulmonary disease.   Electronically Signed   By: Charline Bills M.D.   On: 08/10/2014 13:43     EKG: Initial EKG with sinus rhythm and nonspecific inferolateral ST changes; EKG #2 sinus tachycardia and subtle downsloping ST segments in lateral leads   Assessment & Plan  Principal Problem:   ? Sepsis w/ associated hypotension -Admit to stepdown -Appreciate PCCM assistance -Patient's hypotension has responded to fluid challenge initially but need to monitor closely -No definitive source yet identified but given abnormal transaminases biliary etiologies such as cholangitis certainly in the differential despite benign abdominal exam noting patient does have leukocytosis with left shift -Blood cultures have been ordered -Continue empiric antibiotics for now -Gentle IV fluid hydration since renal failure patient -Lactic acid  this far has been normal but we'll continue to cycle -Procalcitonin not helpful in dialysis patient  Active Problems:   Abnormal transaminases -This is not normal for this patient and there is a mild obstructive pattern so concerning for underlying cholecystitis or cholangitis -Abdominal ultrasound pending; given patient's body habitus if this is not helpful may require CT of the abdomen and pelvis -Repeat labs in a.m. -Empiric gram-negative coverage with antibiotics -Blood cultures as above -Hold statin -Nothing by mouth    Metabolic encephalopathy -Unclear if related to infectious process or related to patient's preadmission medications -Patient was on Desyrel, Neurontin prior to admission and so will will hold these medicines but will  continue Duragesic patch -Monitor closely -If worsens may require ABG    End-stage renal disease on hemodialysis -Nephrology consulted and aware of patient admission and the fact that he was unable to complete dialysis today -Concern is if hypotension recurs and persists may require CVVHD   Generalized weakness -Unclear if related to sepsis process versus sedative medications versus other metabolic cause -Patient on statin drug prior to admission so we'll check CPK to rule out rhabdomyolysis -Continue to follow closely    History of placement of stent in LAD coronary artery/Elevated troponin/? Chest pain -Appreciate cardiology assistance -Recommend treating with full dose aspirin, statin and beta blocker; unfortunately statin on hold because of significant transaminitis and beta blocker on hold because of recent hypotension -Medication reconciliation sheet states patient may have been on Effient but he is unable to clarify-this was discussed with cardiology who states to utilize above recommended regimen for now    HTN  -Currently hypotensive/very soft blood pressures    Radicular leg pain/ Right-sided low back pain with right-sided  sciatica -We'll need physical therapy evaluation once hemodynamically stable     DVT Prophylaxis: Subcutaneous heparin  Family Communication: No family at bedside    Code Status: Full code   Condition: Guarded  Time spent in minutes : 60   Amauris Debois L. ANP on 08/10/2014 at 6:15 PM  Between 7am to 7pm - Pager - [phone]  After 7pm go to [email or web address] - password [email or web address]  And look for the night coverage person covering me after hours  [facility]

## 2014-08-10 NOTE — Consult Note (Signed)
Patient ID: GRAE NISKA MRN: [id number], DOB/AGE: January 07, 1956   Admit date: 08/10/2014   Primary Physician: Dyke Maes, MD Primary Cardiologist: Dr. Rennis Golden Chief Complaint: chest pain Pt. Profile:  THESEUS MALBROUGH is a 59 y.o. 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  HPI: Cath on 04/2013 revealed 3 vessel disease, but he was not felt to be a candidate for CABG. He had a DES to the LAD and PCI to the RCA was attempted but it was occluded and could not be crossed. The CFX also had high-grade stenosis and will be treated medically. His EF was preserved. He had not had recurrent chest pain or SOB. Last seen by Dr. Rennis Golden 07/19/2014 and he was cleared for back surgery as low risk.   He complains of generalized fatigue, productive cough, diffuse myalgia, worsening over the course of one week. Patient is afebrile, mildly tachycardic, completed 2 hours of his normal hemodialysis this morning. He states his generalized pain got worse and developed chest pain.   Chest pain started during dialysis today and currently his CP is 9/10 in ED with some SOB.This pain is like his previous angina, however it is more severe. He got ASA 324mg , fentanyl and morphine in ED and Zofran. No change in pain with medications. There is radiation of pain to his back, but no nausea. He is complaining of severe back pain and hip pain. The pain is not changed with deep inspiration and no pain with chest palpation. He is unable to describe quality of pain.   POC trop is 0.19. K 5.2, Cr 4.6.    Problem List  Past Medical History  Diagnosis Date  . Anxiety   . Back pain   . GERD (gastroesophageal reflux disease)   . Peripheral neuropathy   . Arthritis   . CHF (congestive heart failure)   . Active smoker   . ESRD on hemodialysis     [name] 4 days per week on  M-Tu-Wed and Fri.  Started HD in 1998 and has been on HD initially at [facility], then went to [facility], then in [facility], then to [facility] and now is at [facility] for last 10 years.  Has L thigh AVG.     . Diabetes mellitus     diet controlled  . 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 PONV (postoperative nausea and vomiting)   . Hypertension   . Bell palsy   . Heart murmur   . Carpal tunnel syndrome     bilateral  . Dysrhythmia     Hx: of palpitataions  . Headache(784.0)     Hx: of Migraines    Past Surgical History  Procedure Laterality Date  . Total hip arthroplasty    . Thyroidectomy    . Tooth extraction    . Mandible fracture surgery    . Dg av dialysis graft declot or    . Insertion of dialysis catheter  03/28/2011    Procedure: INSERTION OF DIALYSIS CATHETER;  Surgeon: Pryor Ochoa, MD;  Location: [facility] OR;  Service: Vascular;  Laterality: Left;  . Av fistula placement  03/31/2011    Procedure: INSERTION OF ARTERIOVENOUS (AV) GORE-TEX GRAFT THIGH;  Surgeon: Sherren Kerns, MD;  Location: [facility] OR;  Service: Vascular;  Laterality: Right;  redo right thigh arteriovenous gortex graft using gore-tex stretch 6mm x 70cm  . Thrombectomy w/ embolectomy  06/02/2011    Procedure: THROMBECTOMY ARTERIOVENOUS GORE-TEX GRAFT;  Surgeon: Chuck Hint, MD;  Location: [facility] OR;  Service: Vascular;  Laterality: Right;  Thrombectomy right thigh arteriovenous gortex graft;  revision by  replacement of large portion of graft with 7mm gore-tex   . Insertion of dialysis catheter  08/28/2011    Procedure: INSERTION OF DIALYSIS CATHETER;  Surgeon: Fransisco Hertz, MD;  Location: [facility] OR;  Service: Vascular;  Laterality: Left;  Atempted Bilateral Internal Jugular, Bilateral Subclavin insertion of 55cm Dialysis Catheter Left Femoral  . Insertion of dialysis catheter  12/15/2011    Procedure: INSERTION OF DIALYSIS CATHETER;  Surgeon: Sherren Kerns, MD;  Location: [facility] OR;  Service: Vascular;   Laterality: Right;  Insertion of Right Femoral Dialysis Catheter  . Exchange of a dialysis catheter  02/26/2012    Procedure: EXCHANGE OF A DIALYSIS CATHETER;  Surgeon: Larina Earthly, MD;  Location: [facility] OR;  Service: Vascular;  Laterality: Right;  . Joint replacement    . Exchange of a dialysis catheter  05/18/2012    Procedure: EXCHANGE OF A DIALYSIS CATHETER;  Surgeon: Larina Earthly, MD;  Location: [facility] OR;  Service: Vascular;  Laterality: Right;  right femoral dialysis catheter  . Av fistula placement  05/18/2012    Procedure: INSERTION OF ARTERIOVENOUS (AV) GORE-TEX GRAFT THIGH;  Surgeon: Larina Earthly, MD;  Location: [facility] OR;  Service: Vascular;  Laterality: Left;  using 6mm by 70cm goretex graft  . Thrombectomy w/ embolectomy Left 08/25/2012    Procedure: THROMBECTOMY ARTERIOVENOUS GORE-TEX GRAFT;  Surgeon: Pryor Ochoa, MD;  Location: [facility] OR;  Service: Vascular;  Laterality: Left;  Attempted thrombectomy of left thigh arteriovenous gortex graft.   . Av fistula placement Left 08/25/2012    Procedure: INSERTION OF ARTERIOVENOUS (AV) GORE-TEX GRAFT THIGH;  Surgeon: Pryor Ochoa, MD;  Location: [facility] OR;  Service: Vascular;  Laterality: Left;  Using 6mm x 40cm vascular Gortex graft.  . Revision of arteriovenous goretex graft Left 12/14/2012    Procedure: Revision of Left Thigh Graft;  Surgeon: Larina Earthly, MD;  Location: [facility] OR;  Service: Vascular;  Laterality: Left;  . Avgg removal Left 02/16/2013    Procedure: EXCISION OF LEFT ARM ARTERIOVENOUS GORETEX GRAFT TIMES 2 WITH VEIN PATCH ANGIOPLASTY OF BRACIAL ARTERY.  ;  Surgeon: Chuck Hint, MD;  Location: [facility] OR;  Service: Vascular;  Laterality: Left;  Converted from MAC to General.    . Revision of arteriovenous goretex graft Left 08/08/2013    Procedure: REVISION OF LEFT THIGH ARTERIOVENOUS GORETEX GRAFT;  Surgeon: Sherren Kerns, MD;  Location: [facility] OR;  Service: Vascular;  Laterality: Left;  . Venogram Bilateral 10/27/2011    Procedure: VENOGRAM;   Surgeon: Nada Libman, MD;  Location: [facility] CATH LAB;  Service: Cardiovascular;  Laterality: Bilateral;  bilat upper extrem venograms  . Left heart cath Bilateral 05/21/2013    Procedure: LEFT HEART CATH;  Surgeon: Iran Ouch, MD;  Location: [facility] CATH LAB;  Service: Cardiovascular;  Laterality: Bilateral;  . Percutaneous coronary stent intervention (pci-s)  05/21/2013    Procedure: PERCUTANEOUS CORONARY STENT INTERVENTION (PCI-S);  Surgeon: Iran Ouch, MD;  Location: [facility] CATH LAB;  Service: Cardiovascular;;  . Coronary angioplasty with stent placement    . Radiology with anesthesia N/A 06/27/2014    Procedure: MRI LUMBER WITHOUT  CONTRAST;  Surgeon: Medication Radiologist, MD;  Location: [facility] OR;  Service: Radiology;  Laterality: N/A;     Allergies  No Known Allergies   Home Medications  Prior to Admission medications   Medication Sig Start Date End Date Taking? Authorizing Provider  aspirin 81 MG tablet Take 1 tablet (81 mg total) by mouth daily. 05/24/13   Rhonda G Barrett, PA-C  atorvastatin (LIPITOR) 80 MG tablet Take 1 tablet (80 mg total) by mouth daily at 6 PM. 05/24/13   Rhonda G Barrett, PA-C  b complex-vitamin c-folic acid (NEPHRO-VITE) 0.8 MG TABS Take 0.8 mg by mouth at bedtime.     Historical Provider, MD  calcium carbonate (TUMS - DOSED IN MG ELEMENTAL CALCIUM) 500 MG chewable tablet Chew 3 tablets by mouth 3 (three) times daily.    Historical Provider, MD  colchicine 0.6 MG tablet Take 0.6 mg by mouth 2 (two) times daily as needed. For gout    Historical Provider, MD  fentaNYL (DURAGESIC - DOSED MCG/HR) 50 MCG/HR Apply one patch topically every 72 hours for pain 07/17/14   Oneal Grout, MD  gabapentin (NEURONTIN) 100 MG capsule Take 1 capsule by mouth 2 (two) times daily as needed. 12/29/13   Historical Provider, MD  latanoprost (XALATAN) 0.005 % ophthalmic solution Place 1 drop into both eyes at bedtime.  04/20/14   Historical Provider, MD  loperamide (IMODIUM) 2 MG capsule  Take 4 mg by mouth as needed for diarrhea or loose stools.    Historical Provider, MD  naproxen sodium (ANAPROX) 220 MG tablet Take 440 mg by mouth 2 (two) times daily with a meal.    Historical Provider, MD  nitroGLYCERIN (NITROSTAT) 0.4 MG SL tablet Place 1 tablet (0.4 mg total) under the tongue every 5 (five) minutes as needed for chest pain. 05/24/13   Rhonda G Barrett, PA-C  omeprazole (PRILOSEC) 40 MG capsule Take 40 mg by mouth daily.    Historical Provider, MD  oxyCODONE (OXY IR/ROXICODONE) 5 MG immediate release tablet Take 1-2 tablets (5-10 mg total) by mouth every 4 (four) hours as needed for moderate pain. 07/17/14   Oneal Grout, MD  prasugrel (EFFIENT) 10 MG TABS tablet Take 1 tablet (10 mg total) by mouth daily. 06/16/13   Chrystie Nose, MD  sodium bicarbonate 650 MG tablet Take 650 mg by mouth 2 (two) times daily.      Historical Provider, MD  sulindac (CLINORIL) 200 MG tablet Take 200 mg by mouth 2 (two) times daily.    Historical Provider, MD  traZODone (DESYREL) 50 MG tablet Take 50 mg by mouth at bedtime as needed for sleep.  03/06/13   Historical Provider, MD    Family History  Family History  Problem Relation Age of Onset  . Diabetes Mother   . Stroke Mother   . Heart disease Mother   . Kidney disease Father   . Hyperlipidemia Father    Family Status  Relation Status Death Age  . Mother Alive   . Father Deceased      Social History  History   Social History  . Marital Status: Single    Spouse Name: N/A  . Number of Children: N/A  . Years of Education: N/A   Occupational History  . Not on file.   Social History Main Topics  . Smoking status: Current Every Day Smoker -- 0.20 packs/day for 40 years    Types: Cigarettes  . Smokeless tobacco: Never Used  . Alcohol Use: No  .  Drug Use: No  . Sexual Activity: No   Other Topics Concern  . Not on file   Social History Narrative     All other systems reviewed and are otherwise negative except as  noted above.  Physical Exam  Blood pressure 96/68, pulse 106, temperature 98 F (36.7 C), temperature source Oral, resp. rate 18, SpO2 96 %.  General: Pleasant, NAD Psych: Anxious and uncomfortable. Neuro: Alert and oriented X 3. Moves all extremities spontaneously with pain.  HEENT: Normal  Neck: Supple without bruits or JVD. Lungs:  Resp regular and unlabored, CTA. Heart: RRR no s3, s4, or murmurs. Abdomen: Soft, non-tender, non-distended, BS + x 4.  Extremities: No clubbing, cyanosis or edema. PT 2+ and equal bilaterally. generalized weakness.  Skin: various diffuse scar 2nd to HD access procedures.   Labs  No results for input(s): CKTOTAL, CKMB, TROPONINI in the last 72 hours. Lab Results  Component Value Date   WBC 14.8* 08/10/2014   HGB 12.1* 08/10/2014   HCT 37.7* 08/10/2014   MCV 95.7 08/10/2014   PLT 253 08/10/2014    Recent Labs Lab 08/10/14 1248  NA 135  K 5.2*  CL 95*  CO2 28  BUN 23  CREATININE 4.60*  CALCIUM 9.7  PROT 7.9  BILITOT 2.7*  ALKPHOS 248*  ALT 235*  AST 1223*  GLUCOSE 90   Lab Results  Component Value Date   CHOL 121 05/23/2013   HDL 20* 05/23/2013   LDLCALC 69 05/23/2013   TRIG 162* 05/23/2013     Radiology/Studies  Dg Chest 1 View  08/10/2014   CLINICAL DATA:  Chronic low back pain, now with hip pain  EXAM: CHEST  1 VIEW  COMPARISON:  06/19/2014  FINDINGS: Lungs are essentially clear. No focal consolidation. No pleural effusion or pneumothorax.  The heart is top-normal in size.  Surgical clips overlying the neck.  IMPRESSION: No evidence of acute cardiopulmonary disease.   Electronically Signed   By: Charline Bills M.D.   On: 08/10/2014 13:43   LHC: 05/21/2013 Final Conclusions:  1. Severe three-vessel coronary artery disease. Occluded mid RCA to the culprit for inferior ST elevation myocardial infarction. However, this is a late presentation of at least 48 hours. There is high grade proximal LAD stenosis and significant ostial  left circumflex stenosis.  2. High normal left ventricular end-diastolic pressure of with normal LV systolic function with ejection fraction of 55% and moderate inferior wall hypokinesis  3. Unsuccessful RCA PCI due to inability to cross the occlusion in spite of using 4 different wires.  4. successful angioplasty and drug-eluting stent placement to the proximal LAD  ECG SR Ventricular Rate: 87 PR Interval: 150 QRS Duration: 106 QT Interval: 408 QTC Calculation: 491 R Axis: 64 Text Interpretation: Sinus rhythm Low voltage, precordial leads  Nonspecific ST and T wave abnormality Lateral leads Borderline prolonged  QT interval Baseline wander When compared with ECG of 06/24/2014 QT  prolonged and Nonspecific ST and T wave abnormality is now Present .  ASSESSMENT AND PLAN  JONNY DEARDEN is a 59 y.o. male with a history of history of CAD s/p DES to LAD, ESRD on hemodialysis, DM, neuropathy, HTN, morbid obisity and HL who consulted for elevated trop and abnormal EKG.  1. Chest pain. Unclear if angina. Mild elevation in troponin possibly secondary to ESRD. Continue cycle enzyme. Will do cath if enzymes significantly elevated. Otherwise, medical therapy for known CAD. Continue ASA, statin, Effient. No BB or ACE 2nd to  borderline hypotension. Do not think BP tolerates long acting nitrates, consider Ranexa.   2. Elevated troponin 3. CAD s/p LAD stent 1/15  4. ESRD 5. Severe back pain 6. DM2 7. Morbid obesity   Signed, Bhagat,Bhavinkumar, PA-C 08/10/2014, 2:16 PM  Pager [phone]  Patient seen and examined with Vin Bagat, PA-C. We discussed all aspects of the encounter. I agree with the assessment and plan as stated above.   Patient with h/o multiple medical problems including ERSD and known CAD per cath in 1/15. A that time had acute IMI. RCA unable to be opened. Underwent stenting of 95% LAD lesion. There was also a high grade lesion in the ostium of a small AV groove  circumflex but this was treated medically as all the major OM branches came off a large ramus which was ok. Recently has been struggling with severe back pain. Currently unable to walk. Denies CP at baseline. Today in HD had pain all over with some pain in his chest which he can't describe well. Pain has now resolved. In ER, he is in severe back pain. No CP. Respiratory status ok. Troponin only minimally elevated at 0.12. However ECG does have new ST depression.   My suspicion for ACS is quite low however, given his h/o and abnormal ECG agree with brining in for rule out MI and control of his back pain. If tropoins stay flat would consider Myoview in am. If troponins climb may need cath. Treat with ASA, statin and b-blocker as BP tolerates. Would not start heparin unless CP recurs or troponins are rising.   Hawley Michel,MD 4:30 PM

## 2014-08-11 ENCOUNTER — Inpatient Hospital Stay (HOSPITAL_COMMUNITY): Payer: Medicare Other

## 2014-08-11 DIAGNOSIS — T82898A Other specified complication of vascular prosthetic devices, implants and grafts, initial encounter: Secondary | ICD-10-CM

## 2014-08-11 DIAGNOSIS — R740 Nonspecific elevation of levels of transaminase and lactic acid dehydrogenase [LDH]: Secondary | ICD-10-CM

## 2014-08-11 DIAGNOSIS — Z955 Presence of coronary angioplasty implant and graft: Secondary | ICD-10-CM

## 2014-08-11 DIAGNOSIS — R072 Precordial pain: Secondary | ICD-10-CM

## 2014-08-11 DIAGNOSIS — R079 Chest pain, unspecified: Secondary | ICD-10-CM

## 2014-08-11 LAB — CK: Total CK: 14273 U/L — ABNORMAL HIGH (ref 7–232)

## 2014-08-11 LAB — COMPREHENSIVE METABOLIC PANEL
ALBUMIN: 2.2 g/dL — AB (ref 3.5–5.2)
ALT: 234 U/L — ABNORMAL HIGH (ref 0–53)
AST: 1218 U/L — AB (ref 0–37)
Alkaline Phosphatase: 213 U/L — ABNORMAL HIGH (ref 39–117)
Anion gap: 18 — ABNORMAL HIGH (ref 5–15)
BUN: 33 mg/dL — AB (ref 6–23)
CO2: 24 mmol/L (ref 19–32)
Calcium: 8.8 mg/dL (ref 8.4–10.5)
Chloride: 94 mmol/L — ABNORMAL LOW (ref 96–112)
Creatinine, Ser: 6.25 mg/dL — ABNORMAL HIGH (ref 0.50–1.35)
GFR calc non Af Amer: 9 mL/min — ABNORMAL LOW (ref >90)
GFR, EST AFRICAN AMERICAN: 10 mL/min — AB (ref >90)
Glucose, Bld: 68 mg/dL — ABNORMAL LOW (ref 70–99)
Potassium: 4.7 mmol/L (ref 3.5–5.1)
SODIUM: 136 mmol/L (ref 135–145)
Total Bilirubin: 2.1 mg/dL — ABNORMAL HIGH (ref 0.3–1.2)
Total Protein: 7.2 g/dL (ref 6.0–8.3)

## 2014-08-11 LAB — CBC
HCT: 33.7 % — ABNORMAL LOW (ref 39.0–52.0)
HEMOGLOBIN: 11.1 g/dL — AB (ref 13.0–17.0)
MCH: 31.6 pg (ref 26.0–34.0)
MCHC: 32.9 g/dL (ref 30.0–36.0)
MCV: 96 fL (ref 78.0–100.0)
PLATELETS: 268 10*3/uL (ref 150–400)
RBC: 3.51 MIL/uL — ABNORMAL LOW (ref 4.22–5.81)
RDW: 17.3 % — AB (ref 11.5–15.5)
WBC: 22.3 10*3/uL — ABNORMAL HIGH (ref 4.0–10.5)

## 2014-08-11 LAB — HEPATITIS PANEL, ACUTE
HCV Ab: REACTIVE — AB
Hep A IgM: NONREACTIVE
Hep B C IgM: NONREACTIVE
Hepatitis B Surface Ag: NEGATIVE

## 2014-08-11 LAB — TROPONIN I
Troponin I: 1.98 ng/mL (ref <0.031)
Troponin I: 2.11 ng/mL (ref <0.031)
Troponin I: 2.37 ng/mL (ref <0.031)

## 2014-08-11 LAB — APTT: aPTT: 38 seconds — ABNORMAL HIGH (ref 24–37)

## 2014-08-11 LAB — GLUCOSE, CAPILLARY: Glucose-Capillary: 99 mg/dL (ref 70–99)

## 2014-08-11 LAB — PROTIME-INR
INR: 1.09 (ref 0.00–1.49)
PROTHROMBIN TIME: 14.3 s (ref 11.6–15.2)

## 2014-08-11 LAB — MONONUCLEOSIS SCREEN: Mono Screen: NEGATIVE

## 2014-08-11 MED ORDER — FENTANYL CITRATE (PF) 100 MCG/2ML IJ SOLN
INTRAMUSCULAR | Status: AC
  Filled 2014-08-11: qty 2

## 2014-08-11 MED ORDER — SODIUM CHLORIDE 0.9 % IV BOLUS (SEPSIS): 500.0000 mL | Freq: Once | INTRAVENOUS | Status: DC

## 2014-08-11 MED ORDER — FENTANYL CITRATE (PF) 100 MCG/2ML IJ SOLN
25.0000 ug | INTRAMUSCULAR | Status: DC | PRN
  Administered 2014-08-11 – 2014-08-14 (×12): 25 ug via INTRAVENOUS
  Filled 2014-08-11 (×11): qty 2

## 2014-08-11 MED ORDER — REGADENOSON 0.4 MG/5ML IV SOLN
INTRAVENOUS | Status: AC
  Filled 2014-08-11: qty 5

## 2014-08-11 MED ORDER — TECHNETIUM TC 99M SESTAMIBI GENERIC - CARDIOLITE
10.0000 | Freq: Once | INTRAVENOUS | Status: AC | PRN
  Administered 2014-08-11: 10 via INTRAVENOUS

## 2014-08-11 MED ORDER — REGADENOSON 0.4 MG/5ML IV SOLN
0.4000 mg | Freq: Once | INTRAVENOUS | Status: AC
  Administered 2014-08-11: 0.4 mg via INTRAVENOUS
  Filled 2014-08-11: qty 5

## 2014-08-11 MED ORDER — TECHNETIUM TC 99M SESTAMIBI - CARDIOLITE
30.0000 | Freq: Once | INTRAVENOUS | Status: AC | PRN
Start: 2014-08-11 — End: 2014-08-11
  Administered 2014-08-11: 30 via INTRAVENOUS

## 2014-08-11 MED ORDER — INSULIN ASPART 100 UNIT/ML ~~LOC~~ SOLN
0.0000 [IU] | Freq: Three times a day (TID) | SUBCUTANEOUS | Status: DC
  Administered 2014-08-14: 2 [IU] via SUBCUTANEOUS

## 2014-08-11 MED ORDER — FENTANYL CITRATE (PF) 100 MCG/2ML IJ SOLN
25.0000 ug | Freq: Once | INTRAMUSCULAR | Status: AC
  Administered 2014-08-11: 25 ug via INTRAVENOUS
  Filled 2014-08-11: qty 2

## 2014-08-11 MED ORDER — CETYLPYRIDINIUM CHLORIDE 0.05 % MT LIQD
7.0000 mL | Freq: Two times a day (BID) | OROMUCOSAL | Status: DC
  Administered 2014-08-11 – 2014-08-22 (×21): 7 mL via OROMUCOSAL

## 2014-08-11 MED ORDER — VANCOMYCIN HCL 10 G IV SOLR
2000.0000 mg | Freq: Once | INTRAVENOUS | Status: AC
  Administered 2014-08-11: 2000 mg via INTRAVENOUS
  Filled 2014-08-11: qty 2000

## 2014-08-11 NOTE — Progress Notes (Signed)
Subjective:  Back from Myoview" Ache all over,want at to try  To eat " Objective Vital signs in last 24 hours: Filed Vitals:   08/11/14 0516 08/11/14 0517 08/11/14 0533 08/11/14 0823  BP:   121/69   Pulse:      Temp:    98.2 F (36.8 C)  TempSrc:    Oral  Resp: 16 14    Height:      Weight:      SpO2:       Weight change:   Physical Exam: General: Alert Obese, uncomfortable ,But NAD, cooperative Heart: RRR, no mur, gallop, rub Lungs: CTA bilat , unlabored breathing Abdomen: Obese, bs pos. ,soft , NT. ND Extremities:no pedal edema Dialysis Access: L fem avg pos. bruit   Problem/Plan: 1. ESRD- HD  =4 X week Mon, Tue, wed, Fri  / had 2 hr tx yest / k 4.7 and vol. Okay today/ next HD Monday. 2. Positive Troponins with ho CAD/ just back from  [place] [facility] /Card . Following 3. HTN/VOL- Hypotension on admit stabilizing  after  IV  Fluids in er/ now  =117/85 / but [facility].       Pre=147/68 and post 160/99 leaving below his  edw  .7 ( edw 116kg) / sepsis issue with ^ wbc/ card with +         trop.   (As OP has had Chronic issues with excess vol gains and bp thus 4X per week) 4    OTHER-Transaminitis agree with Dr Gloriann Loan and would be concerned of hepatotoxicity from tylenol  5.    ^ WBC-  On iv antibiotics ,cultures pending  6.    Anemia - OP ESA 0 / fe  weekly hd/ hgb 11.1 7.    Secondary hyperparathyroidism -  No op vit  D ,  On 3.5 Ca bath with low Ca / last  pth  20/ no current binder          Fu  ca/phos lab  8..  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 - needs PT       help        addendum = About 1 hour after exam called by Floor Rn about Spontaneous Bleeding from Femoral AVGG when / Per Pt trying to sit up on bedside / Large pressure dressing in Place when I arrived pt bp stable and coherent /Dr. Myra Gianotti [facility] called for consult.  Lenny Pastel, PA-C [facility]  [phone] 08/11/2014,10:35 AM  LOS: 1 day   Labs: Basic Metabolic Panel:  Recent Labs Lab 08/10/14 1248 08/11/14 0710  NA 135 136  K 5.2* 4.7  CL 95* 94*  CO2 28 24  GLUCOSE 90 68*  BUN 23 33*  CREATININE 4.60* 6.25*  CALCIUM 9.7 8.8   Liver Function Tests:  Recent Labs Lab 08/10/14 1248 08/11/14 0710  AST 1223* 1218*  ALT 235* 234*  ALKPHOS 248* 213*  BILITOT 2.7* 2.1*  PROT 7.9 7.2  ALBUMIN 2.5* 2.2*    Recent Labs Lab 08/10/14 1830  LIPASE 31   No results for input(s): AMMONIA in the last 168 hours. CBC:  Recent Labs Lab 08/10/14 1248 08/11/14 0710  WBC 14.8* 22.3*  NEUTROABS 13.5*  --   HGB 12.1* 11.1*  HCT 37.7* 33.7*  MCV 95.7 96.0  PLT 253 268   Cardiac Enzymes:  Recent Labs Lab 08/10/14 1437 08/10/14 1830  CKTOTAL  --  11720*  TROPONINI 0.12*  --    CBG:  Recent Labs Lab 08/10/14 1708  GLUCAP 75    Studies/Results: Dg Chest 1 View  08/10/2014   CLINICAL DATA:  Chronic low back pain, now with hip pain  EXAM: CHEST  1 VIEW  COMPARISON:  06/19/2014  FINDINGS: Lungs are essentially clear. No focal consolidation. No pleural effusion or pneumothorax.  The heart is top-normal in size.  Surgical clips overlying the neck.  IMPRESSION: No evidence of acute cardiopulmonary disease.   Electronically Signed   By: Charline Bills M.D.   On: 08/10/2014 13:43   US Abdomen Complete  08/11/2014   CLINICAL DATA:  Transaminitis  EXAM: ULTRASOUND ABDOMEN COMPLETE  COMPARISON:  None.  FINDINGS: Gallbladder: Multiple gallstones. Lung 7 mm adherent stone. No gallbladder wall thickening. No pericholecystic fluid.  Common bile duct: Diameter: 5.7 mm.  No evidence of a duct stone.  Liver: No focal lesion identified. Within normal limits in parenchymal echogenicity.  IVC: No abnormality visualized.  Pancreas: Limited visualization.  Portions seen are unremarkable.  Spleen: Size and appearance within normal limits.  Right Kidney: Length: 9.9 cm. Diffusely  echogenic with diffuse cortical thinning. No hydronephrosis.  Left Kidney: Length: 9.9 cm. Diffusely echogenic with diffuse cortical thinning. No hydronephrosis. 2.6 cm lower pole cyst.  Abdominal aorta: No aneurysm visualized.  Other findings: None.  IMPRESSION: 1. No acute findings. 2. Cholelithiasis without evidence of acute cholecystitis. 3. End-stage renal disease with bilateral small echogenic kidneys. No hydronephrosis. Small left renal cyst. 4. Normal sonographic appearance of the liver.   Electronically Signed   By: Amie Portland M.D.   On: 08/11/2014 07:50   Medications:   . antiseptic oral rinse  7 mL Mouth Rinse BID  . aspirin EC  325 mg Oral Daily  . Chlorhexidine Gluconate Cloth  6 each Topical Q0600  . folic acid  1 mg Intravenous Daily  . heparin  5,000 Units Subcutaneous 3 times per day  . latanoprost  1 drop Both Eyes QHS  . piperacillin-tazobactam (ZOSYN)  IV  2.25 g Intravenous 3 times per day  . sodium bicarbonate  650 mg Oral BID  . thiamine  100 mg Intravenous Daily

## 2014-08-11 NOTE — Consult Note (Signed)
Pt is known to cardiology. He is s/p 3.0 x 18 mm Xience drug-eluting stent placement to prox LAD  (postdilated with a 3.5 noncompliant balloon) in January 2015. Currently pt has a bleeding from the dialysis graft. Dialysis graft needs to be replaced. According to my conversation with vascular surgery procedure has very high risk of bleeding even if patient is not on any antithrombotic medications. Currently patient is on aspirin and prasugrel.   Given the fact that drug-eluting stent was placed more than a year ago it is reasonable to hold prasugrel for the perioperative period and restart as soon as postsurgical bleeding risk is reduced. Continue 81 mg aspirin perioperatively.

## 2014-08-11 NOTE — Progress Notes (Signed)
TELEMETRY: Reviewed telemetry pt in NSR: Filed Vitals:   08/11/14 0516 08/11/14 0517 08/11/14 0533 08/11/14 0823  BP:   121/69   Pulse:      Temp:    98.2 F (36.8 C)  TempSrc:    Oral  Resp: 16 14    Height:      Weight:      SpO2:        Intake/Output Summary (Last 24 hours) at 08/11/14 0949 Last data filed at 08/11/14 0200  Gross per 24 hour  Intake    100 ml  Output      0 ml  Net    100 ml   Filed Weights   08/10/14 1713 08/11/14 0418  Weight: 251 lb 1.6 oz (113.898 kg) 258 lb 4.8 oz (117.164 kg)    Subjective Complains of back, hip, shoulder and hand pain. No chest pain. Has a lot of Phelgm.   Marland Kitchen antiseptic oral rinse  7 mL Mouth Rinse BID  . aspirin EC  325 mg Oral Daily  . Chlorhexidine Gluconate Cloth  6 each Topical Q0600  . folic acid  1 mg Intravenous Daily  . heparin  5,000 Units Subcutaneous 3 times per day  . latanoprost  1 drop Both Eyes QHS  . piperacillin-tazobactam (ZOSYN)  IV  2.25 g Intravenous 3 times per day  . sodium bicarbonate  650 mg Oral BID  . thiamine  100 mg Intravenous Daily      LABS: Basic Metabolic Panel:  Recent Labs  88/50/27 1248 08/11/14 0710  NA 135 136  K 5.2* 4.7  CL 95* 94*  CO2 28 24  GLUCOSE 90 68*  BUN 23 33*  CREATININE 4.60* 6.25*  CALCIUM 9.7 8.8   Liver Function Tests:  Recent Labs  08/10/14 1248 08/11/14 0710  AST 1223* 1218*  ALT 235* 234*  ALKPHOS 248* 213*  BILITOT 2.7* 2.1*  PROT 7.9 7.2  ALBUMIN 2.5* 2.2*    Recent Labs  08/10/14 1830  LIPASE 31   CBC:  Recent Labs  08/10/14 1248 08/11/14 0710  WBC 14.8* 22.3*  NEUTROABS 13.5*  --   HGB 12.1* 11.1*  HCT 37.7* 33.7*  MCV 95.7 96.0  PLT 253 268   Cardiac Enzymes:  Recent Labs  08/10/14 1437 08/10/14 1830  CKTOTAL  --  11720*  TROPONINI 0.12*  --    BNP: No results for input(s): PROBNP in the last 72 hours. D-Dimer: No results for input(s): DDIMER in the last 72 hours. Hemoglobin A1C: No results for input(s):  HGBA1C in the last 72 hours. Fasting Lipid Panel: No results for input(s): CHOL, HDL, LDLCALC, TRIG, CHOLHDL, LDLDIRECT in the last 72 hours. Thyroid Function Tests: No results for input(s): TSH, T4TOTAL, T3FREE, THYROIDAB in the last 72 hours.  Invalid input(s): FREET3   Radiology/Studies:  Dg Chest 1 View  08/10/2014   CLINICAL DATA:  Chronic low back pain, now with hip pain  EXAM: CHEST  1 VIEW  COMPARISON:  06/19/2014  FINDINGS: Lungs are essentially clear. No focal consolidation. No pleural effusion or pneumothorax.  The heart is top-normal in size.  Surgical clips overlying the neck.  IMPRESSION: No evidence of acute cardiopulmonary disease.   Electronically Signed   By: Charline Bills M.D.   On: 08/10/2014 13:43   US Abdomen Complete  08/11/2014   CLINICAL DATA:  Transaminitis  EXAM: ULTRASOUND ABDOMEN COMPLETE  COMPARISON:  None.  FINDINGS: Gallbladder: Multiple gallstones. Lung 7 mm adherent stone. No  gallbladder wall thickening. No pericholecystic fluid.  Common bile duct: Diameter: 5.7 mm.  No evidence of a duct stone.  Liver: No focal lesion identified. Within normal limits in parenchymal echogenicity.  IVC: No abnormality visualized.  Pancreas: Limited visualization.  Portions seen are unremarkable.  Spleen: Size and appearance within normal limits.  Right Kidney: Length: 9.9 cm. Diffusely echogenic with diffuse cortical thinning. No hydronephrosis.  Left Kidney: Length: 9.9 cm. Diffusely echogenic with diffuse cortical thinning. No hydronephrosis. 2.6 cm lower pole cyst.  Abdominal aorta: No aneurysm visualized.  Other findings: None.  IMPRESSION: 1. No acute findings. 2. Cholelithiasis without evidence of acute cholecystitis. 3. End-stage renal disease with bilateral small echogenic kidneys. No hydronephrosis. Small left renal cyst. 4. Normal sonographic appearance of the liver.   Electronically Signed   By: Amie Portland M.D.   On: 08/11/2014 07:50   Ecg: sinus tachy with PVCs.  Lateral ST depression.  PHYSICAL EXAM General: Obese BM appears uncomfortable.  Head: Normocephalic, atraumatic, sclera non-icteric, oropharynx is clear Neck: Negative for carotid bruits. JVD not elevated. No adenopathy Lungs: Clear bilaterally to auscultation without wheezes, rales, or rhonchi. Breathing is unlabored. Heart: RRR S1 S2 without murmurs, rubs, or gallops.  Abdomen: Soft, non-tender, non-distended with normoactive bowel sounds. No hepatomegaly. Extremities: No clubbing, cyanosis or edema.  Distal pedal pulses are 2+ and equal bilaterally. Neuro: Alert and oriented X 3. Moves all extremities spontaneously.   ASSESSMENT AND PLAN: 1. Chest pain with atypical features. Troponin mildly elevated but decreasing. 0.19>>0.12. Ecg does show lateral ST depression. Suspicion for ACS is relatively low. Continue ASA, statin, and beta blocker. Will arrange Lexiscan Myoview today. Anticipate inferior scar with old RCA occlusion. If significant ischemia in anterior wall distribution will need to consider cardiac cath.  2. 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 S/p stent of LAD in Jan. 2015.  3. ESRD on hemodialysis. 4. Acute on chronic back pain. 5. Transaminitis. 6. Morbid obesity.  7. HTN  Present on Admission:  . Elevated troponin . HTN (hypertension) . Radicular leg pain . Right-sided low back pain with right-sided sciatica . Abnormal transaminases . Sepsis associated hypotension . Metabolic encephalopathy . Chest pain  Signed, Mahayla Haddaway [place], MDFACC 08/11/2014 9:49 AM

## 2014-08-11 NOTE — Progress Notes (Signed)
Spoke to on Call NP, patient complaining of more 8/10 shoulder and back pain. NP to put in PRN orders.

## 2014-08-11 NOTE — Progress Notes (Signed)
NM Study completed:  RESULTS IMPRESSION: 1. No reversible ischemia or infarction.  2. Hypokinesia of the inferior septal wall.  3. Left ventricular ejection fraction 52%  4. Low-risk stress test findings*.

## 2014-08-11 NOTE — Progress Notes (Signed)
ANTIBIOTIC CONSULT NOTE - INITIAL  Pharmacy Consult for Zosyn Indication: rule out sepsis  No Known Allergies  Patient Measurements:   Vital Signs: Temp: 98.4 F (36.9 C) (04/16 1712) Temp Source: Oral (04/16 1712) BP: 136/70 mmHg (04/16 1530) Pulse Rate: 91 (04/16 1530) Intake/Output from previous day: 04/15 0701 - 04/16 0700 In: 100 [IV Piggyback:100] Out: -  Intake/Output from this shift:    Labs:  Recent Labs  08/10/14 1248 08/11/14 0710  WBC 14.8* 22.3*  HGB 12.1* 11.1*  PLT 253 268  CREATININE 4.60* 6.25*   Estimated Creatinine Clearance: 16.6 mL/min (by C-G formula based on Cr of 6.25). No results for input(s): VANCOTROUGH, VANCOPEAK, VANCORANDOM, GENTTROUGH, GENTPEAK, GENTRANDOM, TOBRATROUGH, TOBRAPEAK, TOBRARND, AMIKACINPEAK, AMIKACINTROU, AMIKACIN in the last 72 hours.   Microbiology: Recent Results (from the past 720 hour(s))  MRSA PCR Screening     Status: None   Collection Time: 08/10/14  5:33 PM  Result Value Ref Range Status   MRSA by PCR NEGATIVE NEGATIVE Final    Comment:        The GeneXpert MRSA Assay (FDA approved for NASAL specimens only), is one component of a comprehensive MRSA colonization surveillance program. It is not intended to diagnose MRSA infection nor to guide or monitor treatment for MRSA infections.     Medical History: Past Medical History  Diagnosis Date  . Anxiety   . Back pain   . GERD (gastroesophageal reflux disease)   . Peripheral neuropathy   . Arthritis   . Chronic diastolic CHF (congestive heart failure)     a. 04/2013 Echo: EF nl, no rwma, mild LVH.  Marland Kitchen Active smoker   . ESRD on hemodialysis     [name] 4 days per week on M-Tu-Wed and Fri.  Started HD in 1998 and has been on HD initially at [facility], then went to [facility], then in [facility], then to [facility] and now is at [facility] for last 10 years.  Has L thigh AVG.     . Diabetes mellitus     diet controlled  . Hepatitis 2010    pt states  hx of hep B 3 yrs ago  . PONV (postoperative nausea and vomiting)   . Hypertension   . Bell palsy   . Carpal tunnel syndrome     bilateral  . Palpitations   . Headache(784.0)     Hx: of Migraines  . CAD (coronary artery disease)     a. 04/2013 Ant STEMI/PCI: LM nl, LAD 95p (3.0x18 Xience DES), D1/2/3 small, LCX 80ost, RI 20p, RCA 134m CTO (unsuccessful PCI), EF 55%, mod basal inf HK.    Medications:  Anti-infectives    Start     Dose/Rate Route Frequency Ordered Stop   08/10/14 1700  piperacillin-tazobactam (ZOSYN) IVPB 2.25 g     2.25 g 100 mL/hr over 30 Minutes Intravenous 3 times per day 08/10/14 1646       Assessment: 49 yoM with ESRD on HD MTuWF presents from dialysis with generalized weakness, body aches, and chest pain. Of note, patient only completed 30 min of HD on Fri. Pt has been on zosyn empirically. Vanc is going to be added empirically now. P t was getting HD 4x/week. Next plan HD is Monday.   4/15 BCx2 >>  Zosyn 4/15 >> Vanc 4/16>>  Goal of Therapy:  Clinical resolution of infection  Vanc trough preHD = 15-25  Plan:   Zosyn 2.25 g IV q8h Vanc 2g IV  x1 then 1g IV after each HD  Ulyses Southward, PharmD Pager: [phone] 08/11/2014 6:01 PM

## 2014-08-11 NOTE — Progress Notes (Signed)
Patient states he is nervous about how he is going to get help when he leaves the hospital. Patient resides at [facility], and he states he has used up "all of his days". He states he is going to need help with a hospital bed, and "all things he will need" when he leaves the rehab facility or is discharged from [facility]. Patient states that his wife will not be able to help. At this time, patient is fatigued, and can minimally help with his care.

## 2014-08-11 NOTE — Significant Event (Signed)
Rapid Response Event Note  Overview: Time Called: 1511 Arrival Time: 1515 Event Type: Other (Comment)  Initial Focused Assessment:  Called by primary RN for patient bleeding from left thigh graft.  Upon my arrival RN holding pressure on left thigh, bleeding seems to have stopped.  Pressure dressing applied.   Interventions:  MD paged and at bedside.  No RRT interventions at this time, RN to call if assistance needed   Event Summary:   at      at          [facility], Maryagnes Amos

## 2014-08-11 NOTE — Progress Notes (Signed)
Utilization Review Completed.[name] T4/16/2016  

## 2014-08-11 NOTE — Progress Notes (Signed)
[facility] PROGRESS NOTE  KAYMEN SUPPES GSU:[id number] DOB: [birth year] DOA: 08/10/2014 PCP: Dyke Maes, MD Interim summary: 59- year ol  Male with multiple medical issues, admitted for chest pain, back pain.  Assessment/Plan: Hypotension: resolved. unclear etiology.   Elevated liver function tests, and elevated CK levels: Unclear etiology. His hepatitis C antibody is positive. Repeat levels slightly improved. Differential include shock liver, vs hepatitis. US Abdomen unremarkable. We will request GI in am if his repeat LFT's does not improve. He denies any abdominal pain, nausea or vomiting.    Leukocytosis: Worsened to 22,000. Elevated pro calcitonin , normal lactic acid level.  On zosyn, would add vancomycin empirically.   Chest pain / abnormal EKG/ ELEVATED troponins: chest pain resolved. Underwent stress myoview today. Results are unremarkable. Cardiology on board.    Bleeding from the right thigh HD graft with some ulceration medially: Could be the source of infection, vancomycin added to the regimen. Vascular consulted and recommendations given.   ESRD ON  HD: Further management as per renal. HD 4 times a week.    Anemia: Mild and normocytic.    Hyperkalemia: resolved on repeat labs today.   Diabetes Mellitus: CBG (last 3)   Recent Labs  08/10/14 1708  GLUCAP 75   hgba1c ordered and pending.      Code Status: full code Family Communication: family at bedside Disposition Plan: pending.    Consultants:  Renal  Vascular  Cardiology   Procedures:  Stress myoview  HD    Antibiotics:  Vancomycin   zosyn  HPI/Subjective: Reports not feeling good.   Objective: Filed Vitals:   08/11/14 1530  BP: 136/70  Pulse: 91  Temp:   Resp: 21    Intake/Output Summary (Last 24 hours) at 08/11/14 1702 Last data filed at 08/11/14 0200  Gross per 24 hour  Intake    100 ml  Output      0 ml  Net    100 ml   Filed Weights   08/10/14 1713 08/11/14 0418  Weight: 113.898 kg (251 lb 1.6 oz) 117.164 kg (258 lb 4.8 oz)    Exam:   General:  Alert not indistress  Cardiovascular: s1s2  Respiratory: diminished air entry at bases  Abdomen: soft non tender non distended bowel sounds heard  Musculoskeletal: pedal edema present  Data Reviewed: Basic Metabolic Panel:  Recent Labs Lab 08/10/14 1248 08/11/14 0710  NA 135 136  K 5.2* 4.7  CL 95* 94*  CO2 28 24  GLUCOSE 90 68*  BUN 23 33*  CREATININE 4.60* 6.25*  CALCIUM 9.7 8.8   Liver Function Tests:  Recent Labs Lab 08/10/14 1248 08/11/14 0710  AST 1223* 1218*  ALT 235* 234*  ALKPHOS 248* 213*  BILITOT 2.7* 2.1*  PROT 7.9 7.2  ALBUMIN 2.5* 2.2*    Recent Labs Lab 08/10/14 1830  LIPASE 31   No results for input(s): AMMONIA in the last 168 hours. CBC:  Recent Labs Lab 08/10/14 1248 08/11/14 0710  WBC 14.8* 22.3*  NEUTROABS 13.5*  --   HGB 12.1* 11.1*  HCT 37.7* 33.7*  MCV 95.7 96.0  PLT 253 268   Cardiac Enzymes:  Recent Labs Lab 08/10/14 1437 08/10/14 1830 08/11/14 1026  CKTOTAL  --  11720* 14273*  TROPONINI 0.12*  --  1.98*   BNP (last 3 results) No results for input(s): BNP in the last 8760 hours.  ProBNP (last 3 results) No results for input(s): PROBNP in the last 8760 hours.  CBG:  Recent Labs Lab 08/10/14 1708  GLUCAP 75    Recent Results (from the past 240 hour(s))  MRSA PCR Screening     Status: None   Collection Time: 08/10/14  5:33 PM  Result Value Ref Range Status   MRSA by PCR NEGATIVE NEGATIVE Final    Comment:        The GeneXpert MRSA Assay (FDA approved for NASAL specimens only), is one component of a comprehensive MRSA colonization surveillance program. It is not intended to diagnose MRSA infection nor to guide or monitor treatment for MRSA infections.      Studies: Dg Chest 1 View  08/10/2014   CLINICAL DATA:  Chronic low back pain, now with hip pain  EXAM: CHEST  1 VIEW   COMPARISON:  06/19/2014  FINDINGS: Lungs are essentially clear. No focal consolidation. No pleural effusion or pneumothorax.  The heart is top-normal in size.  Surgical clips overlying the neck.  IMPRESSION: No evidence of acute cardiopulmonary disease.   Electronically Signed   By: Charline Bills M.D.   On: 08/10/2014 13:43   US Abdomen Complete  08/11/2014   CLINICAL DATA:  Transaminitis  EXAM: ULTRASOUND ABDOMEN COMPLETE  COMPARISON:  None.  FINDINGS: Gallbladder: Multiple gallstones. Lung 7 mm adherent stone. No gallbladder wall thickening. No pericholecystic fluid.  Common bile duct: Diameter: 5.7 mm.  No evidence of a duct stone.  Liver: No focal lesion identified. Within normal limits in parenchymal echogenicity.  IVC: No abnormality visualized.  Pancreas: Limited visualization.  Portions seen are unremarkable.  Spleen: Size and appearance within normal limits.  Right Kidney: Length: 9.9 cm. Diffusely echogenic with diffuse cortical thinning. No hydronephrosis.  Left Kidney: Length: 9.9 cm. Diffusely echogenic with diffuse cortical thinning. No hydronephrosis. 2.6 cm lower pole cyst.  Abdominal aorta: No aneurysm visualized.  Other findings: None.  IMPRESSION: 1. No acute findings. 2. Cholelithiasis without evidence of acute cholecystitis. 3. End-stage renal disease with bilateral small echogenic kidneys. No hydronephrosis. Small left renal cyst. 4. Normal sonographic appearance of the liver.   Electronically Signed   By: Amie Portland M.D.   On: 08/11/2014 07:50   Nm Myocar Multi W/spect W/wall Motion / Ef  08/11/2014   CLINICAL DATA:  59 year old male with a history of palpitations.  Cardiovascular risk factors include smoking, diabetes, hypertension, coronary artery disease.  EXAM: MYOCARDIAL IMAGING WITH SPECT (REST AND PHARMACOLOGIC-STRESS)  GATED LEFT VENTRICULAR WALL MOTION STUDY  LEFT VENTRICULAR EJECTION FRACTION  TECHNIQUE: Standard myocardial SPECT imaging was performed after resting  intravenous injection of 10 mCi Tc-35m sestamibi. Subsequently, intravenous infusion of Lexiscan was performed under the supervision of the Cardiology staff. At peak effect of the drug, 30 mCi Tc-17m sestamibi was injected intravenously and standard myocardial SPECT imaging was performed. Quantitative gated imaging was also performed to evaluate left ventricular wall motion, and estimate left ventricular ejection fraction.  COMPARISON:  None.  FINDINGS: Perfusion: No decreased activity in the left ventricle on stress imaging to suggest reversible ischemia or infarction.  Wall Motion: Hypokinesia of the inferior septal wall.  Left Ventricular Ejection Fraction: 52 %  End diastolic volume 129 ml  End systolic volume 62 ml  IMPRESSION: 1. No reversible ischemia or infarction.  2. Hypokinesia of the inferior septal wall.  3. Left ventricular ejection fraction 52%  4. Low-risk stress test findings*.  *2012 Appropriate Use Criteria for Coronary Revascularization Focused Update: J Am Coll Cardiol. 2012;59(9):857-881. http://content.dementiazones.com.aspx?articleid=1201161   Electronically Signed   By: Gilmer Mor D.O.  On: 08/11/2014 13:52    Scheduled Meds: . antiseptic oral rinse  7 mL Mouth Rinse BID  . aspirin EC  325 mg Oral Daily  . Chlorhexidine Gluconate Cloth  6 each Topical Q0600  . fentaNYL      . folic acid  1 mg Intravenous Daily  . heparin  5,000 Units Subcutaneous 3 times per day  . latanoprost  1 drop Both Eyes QHS  . piperacillin-tazobactam (ZOSYN)  IV  2.25 g Intravenous 3 times per day  . regadenoson      . sodium bicarbonate  650 mg Oral BID  . thiamine  100 mg Intravenous Daily   Continuous Infusions:   Principal Problem:   Sepsis associated hypotension Active Problems:   End-stage renal disease on hemodialysis   History of placement of stent in LAD coronary artery   HTN (hypertension)   Radicular leg pain   Right-sided low back pain with right-sided sciatica    Elevated troponin   Abnormal transaminases   Metabolic encephalopathy   Chest pain   Shock circulatory   Pain in the chest   Weakness    Time spent: 25 minutes    Dreonna Hussein  [facility] Pager [phone] If 7PM-7AM, please contact night-coverage at [email or web address], password [facility] 08/11/2014, 5:02 PM  LOS: 1 day

## 2014-08-11 NOTE — Progress Notes (Signed)
Spoke with on call NP. Patient in 9/10 pain in back and right shoulder. NP to order one time dose Fentanyl.

## 2014-08-11 NOTE — Progress Notes (Signed)
Dr. P. [place] made aware of pt troponin 1.98.

## 2014-08-11 NOTE — Consult Note (Signed)
Consult Note  Patient name: [name] MRN: [id number] DOB: Feb 28, 1956 Sex: male  Consulting Physician:  Renal  Reason for Consult:  Chief Complaint  Patient presents with  . Weakness    HISTORY OF PRESENT ILLNESS: This is a 59 year old gentleman who I was asked to evaluate for bleeding from his right thigh dialysis graft.  This was initially placed in January 2014 by Dr. early.  The graft underwent thrombectomy and revision to the common femoral vein in May 2014.  In August 2014 he presented with bleeding near the apex of the graft.  An interposition graft was placed.  In April 2015 he had the medial limb of the graft replaced secondary to bleeding.  He was evaluated by Dr. Edilia Bo in March 2016.  The wound was probed with a Q-tip and found to be very superficial.  Local wound care was recommended.  The patient suffers from diabetes.  He was admitted with elevated troponins.  His most recent Myoview showed low risk for ischemia.  He does take Effient.  His hypercholesterolemia is managed with a statin.  He is an active smoker.  Past Medical History  Diagnosis Date  . Anxiety   . Back pain   . GERD (gastroesophageal reflux disease)   . Peripheral neuropathy   . Arthritis   . Chronic diastolic CHF (congestive heart failure)     a. 04/2013 Echo: EF nl, no rwma, mild LVH.  Marland Kitchen Active smoker   . ESRD on hemodialysis     [name] 4 days per week on M-Tu-Wed and Fri.  Started HD in 1998 and has been on HD initially at [facility], then went to [facility], then in [facility], then to [facility] and now is at [facility] for last 10 years.  Has L thigh AVG.     . Diabetes mellitus     diet controlled  . 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 PONV (postoperative nausea and vomiting)   . Hypertension   . Bell palsy   . Carpal tunnel syndrome     bilateral  . Palpitations   . Headache(784.0)     Hx: of Migraines  . CAD (coronary artery disease)     a. 04/2013 Ant  STEMI/PCI: LM nl, LAD 95p (3.0x18 Xience DES), D1/2/3 small, LCX 80ost, RI 20p, RCA 17m CTO (unsuccessful PCI), EF 55%, mod basal inf HK.    Past Surgical History  Procedure Laterality Date  . Total hip arthroplasty    . Thyroidectomy    . Tooth extraction    . Mandible fracture surgery    . Dg av dialysis graft declot or    . Insertion of dialysis catheter  03/28/2011    Procedure: INSERTION OF DIALYSIS CATHETER;  Surgeon: Pryor Ochoa, MD;  Location: [facility] OR;  Service: Vascular;  Laterality: Left;  . Av fistula placement  03/31/2011    Procedure: INSERTION OF ARTERIOVENOUS (AV) GORE-TEX GRAFT THIGH;  Surgeon: Sherren Kerns, MD;  Location: [facility] OR;  Service: Vascular;  Laterality: Right;  redo right thigh arteriovenous gortex graft using gore-tex stretch 6mm x 70cm  . Thrombectomy w/ embolectomy  06/02/2011    Procedure: THROMBECTOMY ARTERIOVENOUS GORE-TEX GRAFT;  Surgeon: Chuck Hint, MD;  Location: [facility] OR;  Service: Vascular;  Laterality: Right;  Thrombectomy right thigh arteriovenous gortex graft;  revision by  replacement of large portion of graft  with 7mm gore-tex   . Insertion of dialysis catheter  08/28/2011    Procedure: INSERTION OF DIALYSIS CATHETER;  Surgeon: Fransisco Hertz, MD;  Location: [facility] OR;  Service: Vascular;  Laterality: Left;  Atempted Bilateral Internal Jugular, Bilateral Subclavin insertion of 55cm Dialysis Catheter Left Femoral  . Insertion of dialysis catheter  12/15/2011    Procedure: INSERTION OF DIALYSIS CATHETER;  Surgeon: Sherren Kerns, MD;  Location: [facility] OR;  Service: Vascular;  Laterality: Right;  Insertion of Right Femoral Dialysis Catheter  . Exchange of a dialysis catheter  02/26/2012    Procedure: EXCHANGE OF A DIALYSIS CATHETER;  Surgeon: Larina Earthly, MD;  Location: [facility] OR;  Service: Vascular;  Laterality: Right;  . Joint replacement    . Exchange of a dialysis catheter  05/18/2012    Procedure: EXCHANGE OF A DIALYSIS CATHETER;  Surgeon: Larina Earthly,  MD;  Location: [facility] OR;  Service: Vascular;  Laterality: Right;  right femoral dialysis catheter  . Av fistula placement  05/18/2012    Procedure: INSERTION OF ARTERIOVENOUS (AV) GORE-TEX GRAFT THIGH;  Surgeon: Larina Earthly, MD;  Location: [facility] OR;  Service: Vascular;  Laterality: Left;  using 6mm by 70cm goretex graft  . Thrombectomy w/ embolectomy Left 08/25/2012    Procedure: THROMBECTOMY ARTERIOVENOUS GORE-TEX GRAFT;  Surgeon: Pryor Ochoa, MD;  Location: [facility] OR;  Service: Vascular;  Laterality: Left;  Attempted thrombectomy of left thigh arteriovenous gortex graft.   . Av fistula placement Left 08/25/2012    Procedure: INSERTION OF ARTERIOVENOUS (AV) GORE-TEX GRAFT THIGH;  Surgeon: Pryor Ochoa, MD;  Location: [facility] OR;  Service: Vascular;  Laterality: Left;  Using 6mm x 40cm vascular Gortex graft.  . Revision of arteriovenous goretex graft Left 12/14/2012    Procedure: Revision of Left Thigh Graft;  Surgeon: Larina Earthly, MD;  Location: [facility] OR;  Service: Vascular;  Laterality: Left;  . Avgg removal Left 02/16/2013    Procedure: EXCISION OF LEFT ARM ARTERIOVENOUS GORETEX GRAFT TIMES 2 WITH VEIN PATCH ANGIOPLASTY OF BRACIAL ARTERY.  ;  Surgeon: Chuck Hint, MD;  Location: [facility] OR;  Service: Vascular;  Laterality: Left;  Converted from MAC to General.    . Revision of arteriovenous goretex graft Left 08/08/2013    Procedure: REVISION OF LEFT THIGH ARTERIOVENOUS GORETEX GRAFT;  Surgeon: Sherren Kerns, MD;  Location: [facility] OR;  Service: Vascular;  Laterality: Left;  . Venogram Bilateral 10/27/2011    Procedure: VENOGRAM;  Surgeon: Nada Libman, MD;  Location: [facility] CATH LAB;  Service: Cardiovascular;  Laterality: Bilateral;  bilat upper extrem venograms  . Left heart cath Bilateral 05/21/2013    Procedure: LEFT HEART CATH;  Surgeon: Iran Ouch, MD;  Location: [facility] CATH LAB;  Service: Cardiovascular;  Laterality: Bilateral;  . Percutaneous coronary stent intervention (pci-s)  05/21/2013    Procedure:  PERCUTANEOUS CORONARY STENT INTERVENTION (PCI-S);  Surgeon: Iran Ouch, MD;  Location: [facility] CATH LAB;  Service: Cardiovascular;;  . Coronary angioplasty with stent placement    . Radiology with anesthesia N/A 06/27/2014    Procedure: MRI LUMBER WITHOUT CONTRAST;  Surgeon: Medication Radiologist, MD;  Location: [facility] OR;  Service: Radiology;  Laterality: N/A;    History   Social History  . Marital Status: Single    Spouse Name: N/A  . Number of Children: N/A  . Years of Education: N/A   Occupational History  . Not on file.   Social History Main Topics  . Smoking status:  Current Every Day Smoker -- 0.20 packs/day for 40 years    Types: Cigarettes  . Smokeless tobacco: Never Used  . Alcohol Use: No  . Drug Use: No  . Sexual Activity: No   Other Topics Concern  . Not on file   Social History Narrative    Family History  Problem Relation Age of Onset  . Diabetes Mother   . Stroke Mother   . Heart disease Mother   . Kidney disease Father   . Hyperlipidemia Father     Allergies as of 08/10/2014  . (No Known Allergies)    No current facility-administered medications on file prior to encounter.   Current Outpatient Prescriptions on File Prior to Encounter  Medication Sig Dispense Refill  . aspirin 81 MG tablet Take 1 tablet (81 mg total) by mouth daily.    Marland Kitchen b complex-vitamin c-folic acid (NEPHRO-VITE) 0.8 MG TABS Take 0.8 mg by mouth at bedtime.     . calcium carbonate (TUMS - DOSED IN MG ELEMENTAL CALCIUM) 500 MG chewable tablet Chew 3 tablets by mouth 3 (three) times daily.    . fentaNYL (DURAGESIC - DOSED MCG/HR) 50 MCG/HR Apply one patch topically every 72 hours for pain 10 patch 0  . latanoprost (XALATAN) 0.005 % ophthalmic solution Place 1 drop into both eyes at bedtime.   2  . omeprazole (PRILOSEC) 40 MG capsule Take 40 mg by mouth daily.    Marland Kitchen oxyCODONE (OXY IR/ROXICODONE) 5 MG immediate release tablet Take 1-2 tablets (5-10 mg total) by mouth every 4 (four) hours  as needed for moderate pain. 360 tablet 0  . atorvastatin (LIPITOR) 80 MG tablet Take 1 tablet (80 mg total) by mouth daily at 6 PM. 30 tablet 11  . colchicine 0.6 MG tablet Take 0.6 mg by mouth 2 (two) times daily as needed. For gout    . gabapentin (NEURONTIN) 100 MG capsule Take 1 capsule by mouth 2 (two) times daily as needed.    . loperamide (IMODIUM) 2 MG capsule Take 4 mg by mouth as needed for diarrhea or loose stools.    . naproxen sodium (ANAPROX) 220 MG tablet Take 440 mg by mouth 2 (two) times daily with a meal.    . nitroGLYCERIN (NITROSTAT) 0.4 MG SL tablet Place 1 tablet (0.4 mg total) under the tongue every 5 (five) minutes as needed for chest pain. 25 tablet 3  . prasugrel (EFFIENT) 10 MG TABS tablet Take 1 tablet (10 mg total) by mouth daily. 30 tablet 6  . sodium bicarbonate 650 MG tablet Take 650 mg by mouth 2 (two) times daily.      . sulindac (CLINORIL) 200 MG tablet Take 200 mg by mouth 2 (two) times daily.    . traZODone (DESYREL) 50 MG tablet Take 50 mg by mouth at bedtime as needed for sleep.        REVIEW OF SYSTEMS: Please see admission history and physical on 08/10/2014.  Chest pain is improved.  Bleeding from left thigh dialysis graft.  otherwise no changes.  PHYSICAL EXAMINATION: General: The patient appears their stated age.  Vital signs are BP 136/70 mmHg  Pulse 91  Temp(Src) 98.4 F (36.9 C) (Oral)  Resp 21  Ht 5\' 11"  (1.803 m)  Wt 258 lb 4.8 oz (117.164 kg)  BMI 36.04 kg/m2  SpO2 100% Pulmonary: Respirations are non-labored HEENT:  No gross abnormalities Abdomen: Soft and non-tender  Musculoskeletal: There are no major deformities.   Neurologic: No focal weakness or  paresthesias are detected, Skin:  Psychiatric: The patient has normal affect. Cardiovascular: Good thrill within dialysis graft.  No active bleeding  Diagnostic Studies: None    Assessment:  Bleeding from left thigh dialysis graft Plan: The patient has a ulcerated area which  has been managed chronically over the past month.  He had an acute bleeding episode today.  He was admitted for atypical chest symptoms.  He does have significant cardiac history.  I think the patient needs to have the medial half of his graft replaced.  He is currently on Effient, as he had a drug-eluting stent placed to his LAD and January 2015.  I spoke with cardiology.  Plan is to discontinue the Effient tonight with removal/replacement of the medial half of his graft once the Effient has worn off.  It'll be restarted after the operation.  If the patient bleeds before that time, I would do this emergently.     Jorge Ny, M.D. [facility] Office: [phone] Pager:  [phone]

## 2014-08-12 ENCOUNTER — Inpatient Hospital Stay (HOSPITAL_COMMUNITY): Payer: Medicare Other

## 2014-08-12 ENCOUNTER — Encounter (HOSPITAL_COMMUNITY): Payer: Self-pay | Admitting: Radiology

## 2014-08-12 DIAGNOSIS — N186 End stage renal disease: Secondary | ICD-10-CM

## 2014-08-12 DIAGNOSIS — R945 Abnormal results of liver function studies: Secondary | ICD-10-CM | POA: Insufficient documentation

## 2014-08-12 DIAGNOSIS — M79659 Pain in unspecified thigh: Secondary | ICD-10-CM | POA: Insufficient documentation

## 2014-08-12 DIAGNOSIS — T827XXA Infection and inflammatory reaction due to other cardiac and vascular devices, implants and grafts, initial encounter: Secondary | ICD-10-CM | POA: Insufficient documentation

## 2014-08-12 DIAGNOSIS — B9689 Other specified bacterial agents as the cause of diseases classified elsewhere: Secondary | ICD-10-CM

## 2014-08-12 DIAGNOSIS — Z992 Dependence on renal dialysis: Secondary | ICD-10-CM

## 2014-08-12 DIAGNOSIS — R6521 Severe sepsis with septic shock: Secondary | ICD-10-CM

## 2014-08-12 DIAGNOSIS — E11621 Type 2 diabetes mellitus with foot ulcer: Secondary | ICD-10-CM | POA: Insufficient documentation

## 2014-08-12 DIAGNOSIS — M545 Low back pain, unspecified: Secondary | ICD-10-CM | POA: Insufficient documentation

## 2014-08-12 DIAGNOSIS — L97509 Non-pressure chronic ulcer of other part of unspecified foot with unspecified severity: Secondary | ICD-10-CM

## 2014-08-12 DIAGNOSIS — L97529 Non-pressure chronic ulcer of other part of left foot with unspecified severity: Secondary | ICD-10-CM

## 2014-08-12 DIAGNOSIS — R9431 Abnormal electrocardiogram [ECG] [EKG]: Secondary | ICD-10-CM | POA: Insufficient documentation

## 2014-08-12 DIAGNOSIS — R7989 Other specified abnormal findings of blood chemistry: Secondary | ICD-10-CM

## 2014-08-12 DIAGNOSIS — I214 Non-ST elevation (NSTEMI) myocardial infarction: Secondary | ICD-10-CM | POA: Insufficient documentation

## 2014-08-12 DIAGNOSIS — M609 Myositis, unspecified: Secondary | ICD-10-CM | POA: Insufficient documentation

## 2014-08-12 DIAGNOSIS — T8579XA Infection and inflammatory reaction due to other internal prosthetic devices, implants and grafts, initial encounter: Secondary | ICD-10-CM

## 2014-08-12 DIAGNOSIS — M60052 Infective myositis, left thigh: Secondary | ICD-10-CM

## 2014-08-12 LAB — CBC
HCT: 33.5 % — ABNORMAL LOW (ref 39.0–52.0)
HEMOGLOBIN: 10.5 g/dL — AB (ref 13.0–17.0)
MCH: 30.3 pg (ref 26.0–34.0)
MCHC: 31.3 g/dL (ref 30.0–36.0)
MCV: 96.5 fL (ref 78.0–100.0)
Platelets: 302 10*3/uL (ref 150–400)
RBC: 3.47 MIL/uL — AB (ref 4.22–5.81)
RDW: 17.2 % — AB (ref 11.5–15.5)
WBC: 20.6 10*3/uL — AB (ref 4.0–10.5)

## 2014-08-12 LAB — COMPREHENSIVE METABOLIC PANEL
ALT: 283 U/L — ABNORMAL HIGH (ref 0–53)
AST: 1533 U/L — AB (ref 0–37)
Albumin: 2.1 g/dL — ABNORMAL LOW (ref 3.5–5.2)
Alkaline Phosphatase: 203 U/L — ABNORMAL HIGH (ref 39–117)
Anion gap: 16 — ABNORMAL HIGH (ref 5–15)
BUN: 46 mg/dL — ABNORMAL HIGH (ref 6–23)
CHLORIDE: 93 mmol/L — AB (ref 96–112)
CO2: 25 mmol/L (ref 19–32)
CREATININE: 7.66 mg/dL — AB (ref 0.50–1.35)
Calcium: 8.4 mg/dL (ref 8.4–10.5)
GFR calc non Af Amer: 7 mL/min — ABNORMAL LOW (ref >90)
GFR, EST AFRICAN AMERICAN: 8 mL/min — AB (ref >90)
Glucose, Bld: 81 mg/dL (ref 70–99)
POTASSIUM: 4.4 mmol/L (ref 3.5–5.1)
SODIUM: 134 mmol/L — AB (ref 135–145)
Total Bilirubin: 1.8 mg/dL — ABNORMAL HIGH (ref 0.3–1.2)
Total Protein: 7.4 g/dL (ref 6.0–8.3)

## 2014-08-12 LAB — EPSTEIN-BARR VIRUS EARLY D ANTIGEN ANTIBODY, IGG: EBV Early Antigen Ab, IgG: <9 U/mL (ref 0.0–8.9)

## 2014-08-12 LAB — GLUCOSE, CAPILLARY
GLUCOSE-CAPILLARY: 80 mg/dL (ref 70–99)
GLUCOSE-CAPILLARY: 107 mg/dL — AB (ref 70–99)
Glucose-Capillary: 94 mg/dL (ref 70–99)
Glucose-Capillary: 81 mg/dL (ref 70–99)

## 2014-08-12 MED ORDER — LORAZEPAM 2 MG/ML IJ SOLN
2.0000 mg | Freq: Once | INTRAMUSCULAR | Status: AC
  Administered 2014-08-12: 2 mg via INTRAVENOUS

## 2014-08-12 MED ORDER — LORAZEPAM 2 MG/ML IJ SOLN
INTRAMUSCULAR | Status: AC
  Filled 2014-08-12: qty 1

## 2014-08-12 MED ORDER — IOHEXOL 300 MG/ML  SOLN
20.0000 mL | INTRAMUSCULAR | Status: AC
  Administered 2014-08-12 (×2): 20 mL via ORAL

## 2014-08-12 NOTE — Consult Note (Signed)
[facility]    Date of Admission:  08/10/2014  Date of Consult:  08/12/2014  Reason for Consult: Sepsis in patient with suspected infected left thigh graft Referring Physician: Dr. Blake Divine   HPI: [name] is an 59 y.o. male with ESRD on HD, DM, Hepatitis C ? Hep B history, CAD, THA who is seen actually in March when he is on the teaching service with drainage from his thigh. At that time he was not thought to have deep infection involving the graft was discharged on oral doxycycline. He was admitted yesterday to the hospital after coming up to the ER with chest pain and back pain and becoming profoundly hypotensive with evidence of septic shock. He was fluid resuscitated blood cultures were drawn and he was started on broad-spectrum antibiotics in the form of vancomycin and Zosyn. He had increasing drainage from his left thigh wound was concerned that the graft is infected. He been seen by vascular surgery plan on intervening tomorrow. His labs are notable for an elevated CPK it has risen from 11,720-14,273.his ALT is also profoundly elevated at 1533 today.  I suspect that he has a severe infection involving his graft likely with possibly around this and potentially involving the muscle as well given the elevated CPK values we are seeing.   Past Medical History  Diagnosis Date  . Anxiety   . Back pain   . GERD (gastroesophageal reflux disease)   . Peripheral neuropathy   . Arthritis   . Chronic diastolic CHF (congestive heart failure)     a. 04/2013 Echo: EF nl, no rwma, mild LVH.  Marland Kitchen Active smoker   . ESRD on hemodialysis     [name] 4 days per week on M-Tu-Wed and Fri.  Started HD in 1998 and has been on HD initially at [facility], then went to [facility], then in [facility], then to [facility] and now is at [facility] for last 10 years.  Has L thigh AVG.     . Diabetes mellitus     diet controlled  . Hepatitis 2010    pt states hx of hep B 3 yrs ago    . PONV (postoperative nausea and vomiting)   . Hypertension   . Bell palsy   . Carpal tunnel syndrome     bilateral  . Palpitations   . Headache(784.0)     Hx: of Migraines  . CAD (coronary artery disease)     a. 04/2013 Ant STEMI/PCI: LM nl, LAD 95p (3.0x18 Xience DES), D1/2/3 small, LCX 80ost, RI 20p, RCA 157m CTO (unsuccessful PCI), EF 55%, mod basal inf HK.  . S/P coronary artery stent placement 05/21/2013    Xience Alpine Medtronic conditional 5 @ 1.5 and 3T     Past Surgical History  Procedure Laterality Date  . Total hip arthroplasty    . Thyroidectomy    . Tooth extraction    . Mandible fracture surgery    . Dg av dialysis graft declot or    . Insertion of dialysis catheter  03/28/2011    Procedure: INSERTION OF DIALYSIS CATHETER;  Surgeon: Pryor Ochoa, MD;  Location: [facility] OR;  Service: Vascular;  Laterality: Left;  . Av fistula placement  03/31/2011    Procedure: INSERTION OF ARTERIOVENOUS (AV) GORE-TEX GRAFT THIGH;  Surgeon: Sherren Kerns, MD;  Location: [facility] OR;  Service: Vascular;  Laterality: Right;  redo right thigh arteriovenous gortex graft using gore-tex stretch  71mm x 70cm  . Thrombectomy w/ embolectomy  06/02/2011    Procedure: THROMBECTOMY ARTERIOVENOUS GORE-TEX GRAFT;  Surgeon: Angelia Mould, MD;  Location: [place];  Service: Vascular;  Laterality: Right;  Thrombectomy right thigh arteriovenous gortex graft;  revision by  replacement of large portion of graft with 69mm gore-tex   . Insertion of dialysis catheter  08/28/2011    Procedure: INSERTION OF DIALYSIS CATHETER;  Surgeon: [address], MD;  Location: [place];  Service: Vascular;  Laterality: Left;  Atempted Bilateral Internal Jugular, Bilateral Subclavin insertion of 55cm Dialysis Catheter Left Femoral  . Insertion of dialysis catheter  12/15/2011    Procedure: INSERTION OF DIALYSIS CATHETER;  Surgeon: Elam Dutch, MD;  Location: [place];  Service: Vascular;  Laterality: Right;  Insertion of Right Femoral  Dialysis Catheter  . Exchange of a dialysis catheter  02/26/2012    Procedure: EXCHANGE OF A DIALYSIS CATHETER;  Surgeon: Rosetta Posner, MD;  Location: [place];  Service: Vascular;  Laterality: Right;  . Joint replacement    . Exchange of a dialysis catheter  05/18/2012    Procedure: EXCHANGE OF A DIALYSIS CATHETER;  Surgeon: Rosetta Posner, MD;  Location: [place];  Service: Vascular;  Laterality: Right;  right femoral dialysis catheter  . Av fistula placement  05/18/2012    Procedure: INSERTION OF ARTERIOVENOUS (AV) GORE-TEX GRAFT THIGH;  Surgeon: Rosetta Posner, MD;  Location: [facility] OR;  Service: Vascular;  Laterality: Left;  using 69mm by 70cm goretex graft  . Thrombectomy w/ embolectomy Left 08/25/2012    Procedure: THROMBECTOMY ARTERIOVENOUS GORE-TEX GRAFT;  Surgeon: Mal Misty, MD;  Location: [place];  Service: Vascular;  Laterality: Left;  Attempted thrombectomy of left thigh arteriovenous gortex graft.   . Av fistula placement Left 08/25/2012    Procedure: INSERTION OF ARTERIOVENOUS (AV) GORE-TEX GRAFT THIGH;  Surgeon: Mal Misty, MD;  Location: [facility] OR;  Service: Vascular;  Laterality: Left;  Using 81mm x 40cm vascular Gortex graft.  . Revision of arteriovenous goretex graft Left 12/14/2012    Procedure: Revision of Left Thigh Graft;  Surgeon: Rosetta Posner, MD;  Location: [place];  Service: Vascular;  Laterality: Left;  . Avgg removal Left 02/16/2013    Procedure: EXCISION OF LEFT ARM ARTERIOVENOUS GORETEX GRAFT TIMES 2 WITH VEIN PATCH ANGIOPLASTY OF BRACIAL ARTERY.  ;  Surgeon: Angelia Mould, MD;  Location: [place];  Service: Vascular;  Laterality: Left;  Converted from [place] to General.    . Revision of arteriovenous goretex graft Left 08/08/2013    Procedure: REVISION OF LEFT THIGH ARTERIOVENOUS GORETEX GRAFT;  Surgeon: Elam Dutch, MD;  Location: [place];  Service: Vascular;  Laterality: Left;  . Venogram Bilateral 10/27/2011    Procedure: VENOGRAM;  Surgeon: Serafina Mitchell, MD;  Location: [facility] CATH  LAB;  Service: Cardiovascular;  Laterality: Bilateral;  bilat upper extrem venograms  . Left heart cath Bilateral 05/21/2013    Procedure: LEFT HEART CATH;  Surgeon: Wellington Hampshire, MD;  Location: [facility] CATH LAB;  Service: Cardiovascular;  Laterality: Bilateral;  . Percutaneous coronary stent intervention (pci-s)  05/21/2013    Procedure: PERCUTANEOUS CORONARY STENT INTERVENTION (PCI-S);  Surgeon: Wellington Hampshire, MD;  Location: [name] CATH LAB;  Service: Cardiovascular;;  . Coronary angioplasty with stent placement    . Radiology with anesthesia N/A 06/27/2014    Procedure: MRI LUMBER WITHOUT CONTRAST;  Surgeon: Medication Radiologist, MD;  Location: [place];  Service: Radiology;  Laterality:  N/A;  ergies:   No Known Allergies   Medications: I have reviewed patients current medications as documented in Epic Anti-infectives    Start     Dose/Rate Route Frequency Ordered Stop   08/11/14 2000  vancomycin (VANCOCIN) 2,000 mg in sodium chloride 0.9 % 500 mL IVPB     2,000 mg 250 mL/hr over 120 Minutes Intravenous  Once 08/11/14 1803 08/11/14 2343   08/10/14 1700  piperacillin-tazobactam (ZOSYN) IVPB 2.25 g  Status:  Discontinued     2.25 g 100 mL/hr over 30 Minutes Intravenous 3 times per day 08/10/14 1646 08/12/14 1054      Social History:  reports that he has been smoking Cigarettes.  He has a 8 pack-year smoking history. He has never used smokeless tobacco. He reports that he does not drink alcohol or use illicit drugs.  Family History  Problem Relation Age of Onset  . Diabetes Mother   . Stroke Mother   . Heart disease Mother   . Kidney disease Father   . Hyperlipidemia Father     As in HPI and primary teams notes otherwise 12 point review of systems is negative  Blood pressure 114/50, pulse 79, temperature 97.9 F (36.6 C), temperature source Oral, resp. rate 18, height $RemoveBe'5\' 11"'beoGKwNjp$  (1.803 m), weight 253 lb 6.4 oz (114.941 kg), SpO2 98 %. General: Alert and awake, oriented x3, not in any  acute distress. HEENT: anicteric sclera, pupils reactive to light and accommodation, EOMI, oropharynx clear and without exudate CVS regular rate,  Chest: no wheezing, rales or rhonchi Abdomen: soft nontender, nondistended, normal bowel sounds, Extremities:   Left thigh wound  With tenderness surrounding this area  08/12/14:     Feet:  08/12/14:     Left foot with ulcer under large toe:        Neuro: nonfocal   Results for orders placed or performed during the hospital encounter of 08/10/14 (from the past 48 hour(s))  Troponin I     Status: Abnormal   Collection Time: 08/10/14  2:37 PM  Result Value Ref Range   Troponin I 0.12 (H) <0.031 ng/mL    Comment:        PERSISTENTLY INCREASED TROPONIN VALUES IN THE RANGE OF 0.04-0.49 ng/mL CAN BE SEEN IN:       -UNSTABLE ANGINA       -CONGESTIVE HEART FAILURE       -MYOCARDITIS       -CHEST TRAUMA       -ARRYHTHMIAS       -LATE PRESENTING MYOCARDIAL INFARCTION       -COPD   CLINICAL FOLLOW-UP RECOMMENDED.   Lactic acid, plasma     Status: None   Collection Time: 08/10/14  4:43 PM  Result Value Ref Range   Lactic Acid, Venous 1.8 0.5 - 2.0 mmol/L  Acetaminophen level     Status: Abnormal   Collection Time: 08/10/14  4:43 PM  Result Value Ref Range   Acetaminophen (Tylenol), Serum <10.0 (L) 10 - 30 ug/mL    Comment:        THERAPEUTIC CONCENTRATIONS VARY SIGNIFICANTLY. A RANGE OF 10-30 ug/mL MAY BE AN EFFECTIVE CONCENTRATION FOR MANY PATIENTS. HOWEVER, SOME ARE BEST TREATED AT CONCENTRATIONS OUTSIDE THIS RANGE. ACETAMINOPHEN CONCENTRATIONS >150 ug/mL AT 4 HOURS AFTER INGESTION AND >50 ug/mL AT 12 HOURS AFTER INGESTION ARE OFTEN ASSOCIATED WITH TOXIC REACTIONS.   Glucose, capillary     Status: None   Collection Time: 08/10/14  5:08 PM  Result Value  Ref Range   Glucose-Capillary 75 70 - 99 mg/dL  MRSA PCR Screening     Status: None   Collection Time: 08/10/14  5:33 PM  Result Value Ref Range   MRSA by  PCR NEGATIVE NEGATIVE    Comment:        The GeneXpert MRSA Assay (FDA approved for NASAL specimens only), is one component of a comprehensive MRSA colonization surveillance program. It is not intended to diagnose MRSA infection nor to guide or monitor treatment for MRSA infections.   Culture, blood (routine x 2)     Status: None (Preliminary result)   Collection Time: 08/10/14  6:08 PM  Result Value Ref Range   Specimen Description BLOOD LEFT ANTECUBITAL    Special Requests      BOTTLES DRAWN AEROBIC AND ANAEROBIC 10 CC BLUE 8CC RED   Culture             BLOOD CULTURE RECEIVED NO GROWTH TO DATE CULTURE WILL BE HELD FOR 5 DAYS BEFORE ISSUING A FINAL NEGATIVE REPORT Performed at [facility]    Report Status PENDING   Culture, blood (routine x 2)     Status: None (Preliminary result)   Collection Time: 08/10/14  6:18 PM  Result Value Ref Range   Specimen Description BLOOD RIGHT HAND    Special Requests BOTTLES DRAWN AEROBIC ONLY 8 CC    Culture             BLOOD CULTURE RECEIVED NO GROWTH TO DATE CULTURE WILL BE HELD FOR 5 DAYS BEFORE ISSUING A FINAL NEGATIVE REPORT Performed at [facility]    Report Status PENDING   Lipase, blood     Status: None   Collection Time: 08/10/14  6:30 PM  Result Value Ref Range   Lipase 31 11 - 59 U/L  CK     Status: Abnormal   Collection Time: 08/10/14  6:30 PM  Result Value Ref Range   Total CK 11720 (H) 7 - 232 U/L    Comment: RESULTS CONFIRMED BY MANUAL DILUTION  Lactic acid, plasma     Status: None   Collection Time: 08/10/14  6:30 PM  Result Value Ref Range   Lactic Acid, Venous 1.8 0.5 - 2.0 mmol/L  Procalcitonin     Status: None   Collection Time: 08/10/14  6:30 PM  Result Value Ref Range   Procalcitonin 8.76 ng/mL    Comment:        Interpretation: PCT > 2 ng/mL: Systemic infection (sepsis) is likely, unless other causes are known. (NOTE)         ICU PCT Algorithm               Non ICU PCT Algorithm     ----------------------------     ------------------------------         PCT < 0.25 ng/mL                 PCT < 0.1 ng/mL     Stopping of antibiotics            Stopping of antibiotics       strongly encouraged.               strongly encouraged.    ----------------------------     ------------------------------       PCT level decrease by               PCT < 0.25 ng/mL       >=  80% from peak PCT       OR PCT 0.25 - 0.5 ng/mL          Stopping of antibiotics                                             encouraged.     Stopping of antibiotics           encouraged.    ----------------------------     ------------------------------       PCT level decrease by              PCT >= 0.25 ng/mL       < 80% from peak PCT        AND PCT >= 0.5 ng/mL            Continuing antibiotics                                               encouraged.       Continuing antibiotics            encouraged.    ----------------------------     ------------------------------     PCT level increase compared          PCT > 0.5 ng/mL         with peak PCT AND          PCT >= 0.5 ng/mL             Escalation of antibiotics                                          strongly encouraged.      Escalation of antibiotics        strongly encouraged.   Protime-INR     Status: None   Collection Time: 08/10/14  6:30 PM  Result Value Ref Range   Prothrombin Time 14.3 11.6 - 15.2 seconds   INR 1.10 0.00 - 1.49  APTT     Status: None   Collection Time: 08/10/14  6:30 PM  Result Value Ref Range   aPTT 34 24 - 37 seconds  Lactic acid, plasma     Status: None   Collection Time: 08/10/14  9:27 PM  Result Value Ref Range   Lactic Acid, Venous 1.4 0.5 - 2.0 mmol/L  Hepatitis panel, acute     Status: Abnormal   Collection Time: 08/10/14  9:27 PM  Result Value Ref Range   Hepatitis B Surface Ag NEGATIVE NEGATIVE   HCV Ab Reactive (A) NEGATIVE   Hep A IgM NON REACTIVE NON REACTIVE    Comment: (NOTE) Effective March 12, 2014, Hepatitis Acute Panel (test code [phone]) will be revised to automatically reflex to the Hepatitis C Viral RNA, Quantitative, Real-Time PCR assay if the Hepatitis C antibody screening result is Reactive. This action is being taken to ensure that the CDC/USPSTF recommended HCV diagnostic algorithm with the appropriate test reflex needed for accurate interpretation is followed.    Hep B C IgM NON REACTIVE NON REACTIVE    Comment: (NOTE) High levels of Hepatitis B Core IgM antibody are detectable during  the acute stage of Hepatitis B. This antibody is used to differentiate current from past HBV infection. Performed at [facility]   Mononucleosis screen     Status: None   Collection Time: 08/10/14 10:48 PM  Result Value Ref Range   Mono Screen NEGATIVE NEGATIVE  Comprehensive metabolic panel     Status: Abnormal   Collection Time: 08/11/14  7:10 AM  Result Value Ref Range   Sodium 136 135 - 145 mmol/L   Potassium 4.7 3.5 - 5.1 mmol/L   Chloride 94 (L) 96 - 112 mmol/L   CO2 24 19 - 32 mmol/L   Glucose, Bld 68 (L) 70 - 99 mg/dL   BUN 33 (H) 6 - 23 mg/dL   Creatinine, Ser 6.25 (H) 0.50 - 1.35 mg/dL   Calcium 8.8 8.4 - 10.5 mg/dL   Total Protein 7.2 6.0 - 8.3 g/dL   Albumin 2.2 (L) 3.5 - 5.2 g/dL   AST 1218 (H) 0 - 37 U/L   ALT 234 (H) 0 - 53 U/L   Alkaline Phosphatase 213 (H) 39 - 117 U/L   Total Bilirubin 2.1 (H) 0.3 - 1.2 mg/dL   GFR calc non Af Amer 9 (L) >90 mL/min   GFR calc Af Amer 10 (L) >90 mL/min    Comment: (NOTE) The eGFR has been calculated using the CKD EPI equation. This calculation has not been validated in all clinical situations. eGFR's persistently <90 mL/min signify possible Chronic Kidney Disease.    Anion gap 18 (H) 5 - 15  CBC     Status: Abnormal   Collection Time: 08/11/14  7:10 AM  Result Value Ref Range   WBC 22.3 (H) 4.0 - 10.5 K/uL   RBC 3.51 (L) 4.22 - 5.81 MIL/uL   Hemoglobin 11.1 (L) 13.0 - 17.0 g/dL   HCT 33.7 (L) 39.0 - 52.0 %     MCV 96.0 78.0 - 100.0 fL   MCH 31.6 26.0 - 34.0 pg   MCHC 32.9 30.0 - 36.0 g/dL   RDW 17.3 (H) 11.5 - 15.5 %   Platelets 268 150 - 400 K/uL  Protime-INR     Status: None   Collection Time: 08/11/14  7:10 AM  Result Value Ref Range   Prothrombin Time 14.3 11.6 - 15.2 seconds   INR 1.09 0.00 - 1.49  APTT     Status: Abnormal   Collection Time: 08/11/14  7:10 AM  Result Value Ref Range   aPTT 38 (H) 24 - 37 seconds    Comment:        IF BASELINE aPTT IS ELEVATED, SUGGEST PATIENT RISK ASSESSMENT BE USED TO DETERMINE APPROPRIATE ANTICOAGULANT THERAPY.   Troponin I (q 6hr x 3)     Status: Abnormal   Collection Time: 08/11/14 10:26 AM  Result Value Ref Range   Troponin I 1.98 (HH) <0.031 ng/mL    Comment:        POSSIBLE MYOCARDIAL ISCHEMIA. SERIAL TESTING RECOMMENDED. REPEATED TO VERIFY CRITICAL RESULT CALLED TO, READ BACK BY AND VERIFIED WITH: RN A TETE AT [id number] MARTINB   CK     Status: Abnormal   Collection Time: 08/11/14 10:26 AM  Result Value Ref Range   [place] 14273 (H) 7 - 232 U/L    Comment: RESULTS CONFIRMED BY MANUAL DILUTION  Troponin I (q 6hr x 3)     Status: Abnormal   Collection Time: 08/11/14  3:42 PM  Result Value Ref Range   Troponin I 2.11 (  HH) <0.031 ng/mL    Comment:        POSSIBLE MYOCARDIAL ISCHEMIA. SERIAL TESTING RECOMMENDED. REPEATED TO VERIFY CRITICAL VALUE NOTED.  VALUE IS CONSISTENT WITH PREVIOUSLY REPORTED AND CALLED VALUE.   Troponin I (q 6hr x 3)     Status: Abnormal   Collection Time: 08/11/14  9:04 PM  Result Value Ref Range   Troponin I 2.37 (HH) <0.031 ng/mL    Comment:        POSSIBLE MYOCARDIAL ISCHEMIA. SERIAL TESTING RECOMMENDED. REPEATED TO VERIFY CRITICAL VALUE NOTED.  VALUE IS CONSISTENT WITH PREVIOUSLY REPORTED AND CALLED VALUE.   Glucose, capillary     Status: None   Collection Time: 08/11/14 10:24 PM  Result Value Ref Range   Glucose-Capillary 99 70 - 99 mg/dL  CBC     Status: Abnormal   Collection  Time: 08/12/14  4:00 AM  Result Value Ref Range   WBC 20.6 (H) 4.0 - 10.5 K/uL   RBC 3.47 (L) 4.22 - 5.81 MIL/uL   Hemoglobin 10.5 (L) 13.0 - 17.0 g/dL   HCT 33.5 (L) 39.0 - 52.0 %   MCV 96.5 78.0 - 100.0 fL   MCH 30.3 26.0 - 34.0 pg   MCHC 31.3 30.0 - 36.0 g/dL   RDW 17.2 (H) 11.5 - 15.5 %   Platelets 302 150 - 400 K/uL  Comprehensive metabolic panel     Status: Abnormal   Collection Time: 08/12/14  4:00 AM  Result Value Ref Range   Sodium 134 (L) 135 - 145 mmol/L   Potassium 4.4 3.5 - 5.1 mmol/L   Chloride 93 (L) 96 - 112 mmol/L   CO2 25 19 - 32 mmol/L   Glucose, Bld 81 70 - 99 mg/dL   BUN 46 (H) 6 - 23 mg/dL   Creatinine, Ser 7.66 (H) 0.50 - 1.35 mg/dL   Calcium 8.4 8.4 - 10.5 mg/dL   Total Protein 7.4 6.0 - 8.3 g/dL   Albumin 2.1 (L) 3.5 - 5.2 g/dL   AST 1533 (H) 0 - 37 U/L   ALT 283 (H) 0 - 53 U/L   Alkaline Phosphatase 203 (H) 39 - 117 U/L   Total Bilirubin 1.8 (H) 0.3 - 1.2 mg/dL   GFR calc non Af Amer 7 (L) >90 mL/min   GFR calc Af Amer 8 (L) >90 mL/min    Comment: (NOTE) The eGFR has been calculated using the CKD EPI equation. This calculation has not been validated in all clinical situations. eGFR's persistently <90 mL/min signify possible Chronic Kidney Disease.    Anion gap 16 (H) 5 - 15  Glucose, capillary     Status: None   Collection Time: 08/12/14  7:56 AM  Result Value Ref Range   Glucose-Capillary 80 70 - 99 mg/dL   '@BRIEFLABTABLE'$ (sdes,specrequest,cult,reptstatus)   ) Recent Results (from the past 720 hour(s))  MRSA PCR Screening     Status: None   Collection Time: 08/10/14  5:33 PM  Result Value Ref Range Status   MRSA by PCR NEGATIVE NEGATIVE Final    Comment:        The GeneXpert MRSA Assay (FDA approved for NASAL specimens only), is one component of a comprehensive MRSA colonization surveillance program. It is not intended to diagnose MRSA infection nor to guide or monitor treatment for MRSA infections.   Culture, blood (routine x 2)      Status: None (Preliminary result)   Collection Time: 08/10/14  6:08 PM  Result Value Ref Range Status  Specimen Description BLOOD LEFT ANTECUBITAL  Final   Special Requests   Final    BOTTLES DRAWN AEROBIC AND ANAEROBIC 10 CC BLUE 8CC RED   Culture   Final           BLOOD CULTURE RECEIVED NO GROWTH TO DATE CULTURE WILL BE HELD FOR 5 DAYS BEFORE ISSUING A FINAL NEGATIVE REPORT Performed at [facility]    Report Status PENDING  Incomplete  Culture, blood (routine x 2)     Status: None (Preliminary result)   Collection Time: 08/10/14  6:18 PM  Result Value Ref Range Status   Specimen Description BLOOD RIGHT HAND  Final   Special Requests BOTTLES DRAWN AEROBIC ONLY 8 CC  Final   Culture   Final           BLOOD CULTURE RECEIVED NO GROWTH TO DATE CULTURE WILL BE HELD FOR 5 DAYS BEFORE ISSUING A FINAL NEGATIVE REPORT Performed at [facility]    Report Status PENDING  Incomplete     Impression/Recommendation  Principal Problem:   Sepsis associated hypotension Active Problems:   End-stage renal disease on hemodialysis   History of placement of stent in LAD coronary artery   HTN (hypertension)   Radicular leg pain   Right-sided low back pain with right-sided sciatica   Elevated troponin   Abnormal transaminases   Metabolic encephalopathy   Chest pain   Shock circulatory   Pain in the chest   Weakness   Abnormal EKG   Abnormal liver function test   [name] is a 59 y.o. 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  --VVS taking him to the OR will be critical and INTRAOPERATIVE TISSUE, PUS for culture would be helpful for guiding therapy  --I am narrowing to gram + coverage with vancomycin for now  #2 Elevated  transaminases: likely from the muscle injury this can cause elevation in these numbers as well, doubt he has flare of HCV causing this  #3 HCV ab +: will check HCV RNA. HCV genotype  I CANNOT UNDERSTAND WHY he has NOT had an HIV test yet. I am sending this off with am labs as well  #4 Diabetic foot ulcer on the left:  I engaged the diabetic foot focused order set and obtained ABIs which showed significant vascular disease in both legs. I have obtained plain films of the left foot in May consider an MRI of this foot as well. Also engaged wound care nutrition diabetic education.  Dr. Linus Salmons taking over the service tomorrow.      08/12/2014, 1:48 PM   Thank you so much for this interesting consult  [place] [facility] [place] [phone] (pager) [phone] (office) 08/12/2014, 1:48 PM  Rhina Br[place] 08/12/2014, 1:48 PM

## 2014-08-12 NOTE — Progress Notes (Signed)
    Subjective  -   1 episode of bleeding yesterday after I saw him.  Otherwise he feels well without complaints   Physical Exam:  Dressing in place and left thigh. Palpable thrill within dialysis graft Respirations nonlabored       Assessment/Plan:    Infected ulcer on the medial side of left thigh dialysis graft.  I had initially posted the patient for revision today, however I found out that he was taking Effient.  Therefore I discussed this with cardiology and the Effient was discontinued.  From the records, it does not look like it was started when he was admitted the hospital.  I will post him tomorrow for revision of his left thigh graft.  He'll need to be nothing by mouth after midnight.  Keshanna Riso IV, V. WELLS 08/12/2014 9:13 AM --  Filed Vitals:   08/12/14 0753  BP: 114/50  Pulse: 79  Temp: 97.9 F (36.6 C)  Resp: 18    Intake/Output Summary (Last 24 hours) at 08/12/14 0913 Last data filed at 08/12/14 6553  Gross per 24 hour  Intake    270 ml  Output      0 ml  Net    270 ml     Laboratory CBC    Component Value Date/Time   WBC 20.6* 08/12/2014 0400   HGB 10.5* 08/12/2014 0400   HCT 33.5* 08/12/2014 0400   PLT 302 08/12/2014 0400    BMET    Component Value Date/Time   NA 134* 08/12/2014 0400   K 4.4 08/12/2014 0400   CL 93* 08/12/2014 0400   CO2 25 08/12/2014 0400   GLUCOSE 81 08/12/2014 0400   BUN 46* 08/12/2014 0400   CREATININE 7.66* 08/12/2014 0400   CALCIUM 8.4 08/12/2014 0400   GFRNONAA 7* 08/12/2014 0400   GFRAA 8* 08/12/2014 0400    COAG Lab Results  Component Value Date   INR 1.09 08/11/2014   INR 1.10 08/10/2014   INR 1.11 08/10/2014   No results found for: PTT  Antibiotics Anti-infectives    Start     Dose/Rate Route Frequency Ordered Stop   08/11/14 2000  vancomycin (VANCOCIN) 2,000 mg in sodium chloride 0.9 % 500 mL IVPB     2,000 mg 250 mL/hr over 120 Minutes Intravenous  Once 08/11/14 1803 08/11/14 2343   08/10/14 1700  piperacillin-tazobactam (ZOSYN) IVPB 2.25 g     2.25 g 100 mL/hr over 30 Minutes Intravenous 3 times per day 08/10/14 1646         V. Charlena Cross, M.D. [facility] Office: [phone] Pager:  [phone]

## 2014-08-12 NOTE — Progress Notes (Signed)
[facility] PROGRESS NOTE  [name] WKG:[id number] DOB: [birth year] DOA: 08/10/2014 PCP: Dyke Maes, MD Interim summary: 59- year ol  Male with multiple medical  Problems including chronic diastolic heart failure, ESRD on HD 4 times a week, diet controlled DM, h/o hepatitis C, hypertension,  CAD s/p PCI, admitted for chest pain, back pain.  On admission he was found to be hypotensive and underwent HD on Friday 4 /14.  Assessment/Plan:  Hypotension: resolved.    Elevated liver function tests, and elevated CK levels: Unclear etiology. His hepatitis C antibody is positive. Differential include shock liver, vs  Acute hepatitis vs rhabdomyolysis vs sepsis, source probably from the left thigh graft. US Abdomen unremarkable. He currently denies nausea or vomiting/ abdominal pain. Ct abdomen ad pelvis ordered for further evaluation. Gastroenterology Dr Randa Evens and ID consult requested   Leukocytosis: improved slightly. Elevated pro calcitonin , normal lactic acid level. Probably from sepsis. On zosyn, added vancomycin empirically.   Chest pain / abnormal EKG/ ELEVATED troponin's CAD s/p PCI: chest pain resolved. Underwent stress myoview. Results are unremarkable. Cardiology signed off. Off Effient for the vascular procedure in am.    Bleeding from the left thigh HD graft with some ulceration medially: Could be the source of infection, vancomycin added to the regimen. Vascular consulted and recommendations given. Plan for OR in am.   ESRD ON  HD: Further management as per renal. HD 4 times a week. Dr Hyman Hopes on board.    Anemia: Mild and normocytic.    Hyperkalemia: resolved on repeat labs today.   Diabetes Mellitus: CBG (last 3)   Recent Labs  08/10/14 1708 08/11/14 2224 08/12/14 0756  GLUCAP 75 99 80   hgba1c ordered and pending. Diet controlled DM.   Hypertension: controlled.    Chronic diastolic heart failure: He appears to be compensated.       Code  Status: full code Family Communication: NONE at bedside Disposition Plan: pending further investigation..    Consultants:  Renal  Vascular  Cardiology   Procedures:  Stress myoview  HD    Antibiotics:  Vancomycin   zosyn  HPI/Subjective: No more bleeding from the thigh graft.   Objective: Filed Vitals:   08/12/14 0753  BP: 114/50  Pulse: 79  Temp: 97.9 F (36.6 C)  Resp: 18    Intake/Output Summary (Last 24 hours) at 08/12/14 1052 Last data filed at 08/12/14 0828  Gross per 24 hour  Intake    270 ml  Output      0 ml  Net    270 ml   Filed Weights   08/10/14 1713 08/11/14 0418 08/12/14 0400  Weight: 113.898 kg (251 lb 1.6 oz) 117.164 kg (258 lb 4.8 oz) 114.941 kg (253 lb 6.4 oz)    Exam:   General:  Alert not indistress  Cardiovascular: s1s2  Respiratory: diminished air entry at bases  Abdomen: soft non tender non distended bowel sounds heard  Musculoskeletal: pedal edema present  Data Reviewed: Basic Metabolic Panel:  Recent Labs Lab 08/10/14 1248 08/11/14 0710 08/12/14 0400  NA 135 136 134*  K 5.2* 4.7 4.4  CL 95* 94* 93*  CO2 28 24 25   GLUCOSE 90 68* 81  BUN 23 33* 46*  CREATININE 4.60* 6.25* 7.66*  CALCIUM 9.7 8.8 8.4   Liver Function Tests:  Recent Labs Lab 08/10/14 1248 08/11/14 0710 08/12/14 0400  AST 1223* 1218* 1533*  ALT 235* 234* 283*  ALKPHOS 248* 213* 203*  BILITOT 2.7* 2.1* 1.8*  PROT 7.9 7.2 7.4  ALBUMIN 2.5* 2.2* 2.1*    Recent Labs Lab 08/10/14 1830  LIPASE 31   No results for input(s): AMMONIA in the last 168 hours. CBC:  Recent Labs Lab 08/10/14 1248 08/11/14 0710 08/12/14 0400  WBC 14.8* 22.3* 20.6*  NEUTROABS 13.5*  --   --   HGB 12.1* 11.1* 10.5*  HCT 37.7* 33.7* 33.5*  MCV 95.7 96.0 96.5  PLT 253 268 302   Cardiac Enzymes:  Recent Labs Lab 08/10/14 1437 08/10/14 1830 08/11/14 1026 08/11/14 1542 08/11/14 2104  CKTOTAL  --  11720* 14273*  --   --   TROPONINI 0.12*  --   1.98* 2.11* 2.37*   BNP (last 3 results) No results for input(s): BNP in the last 8760 hours.  ProBNP (last 3 results) No results for input(s): PROBNP in the last 8760 hours.  CBG:  Recent Labs Lab 08/10/14 1708 08/11/14 2224 08/12/14 0756  GLUCAP 75 99 80    Recent Results (from the past 240 hour(s))  MRSA PCR Screening     Status: None   Collection Time: 08/10/14  5:33 PM  Result Value Ref Range Status   MRSA by PCR NEGATIVE NEGATIVE Final    Comment:        The GeneXpert MRSA Assay (FDA approved for NASAL specimens only), is one component of a comprehensive MRSA colonization surveillance program. It is not intended to diagnose MRSA infection nor to guide or monitor treatment for MRSA infections.   Culture, blood (routine x 2)     Status: None (Preliminary result)   Collection Time: 08/10/14  6:08 PM  Result Value Ref Range Status   Specimen Description BLOOD LEFT ANTECUBITAL  Final   Special Requests   Final    BOTTLES DRAWN AEROBIC AND ANAEROBIC 10 CC BLUE 8CC RED   Culture   Final           BLOOD CULTURE RECEIVED NO GROWTH TO DATE CULTURE WILL BE HELD FOR 5 DAYS BEFORE ISSUING A FINAL NEGATIVE REPORT Performed at [facility]    Report Status PENDING  Incomplete  Culture, blood (routine x 2)     Status: None (Preliminary result)   Collection Time: 08/10/14  6:18 PM  Result Value Ref Range Status   Specimen Description BLOOD RIGHT HAND  Final   Special Requests BOTTLES DRAWN AEROBIC ONLY 8 CC  Final   Culture   Final           BLOOD CULTURE RECEIVED NO GROWTH TO DATE CULTURE WILL BE HELD FOR 5 DAYS BEFORE ISSUING A FINAL NEGATIVE REPORT Performed at [facility]    Report Status PENDING  Incomplete     Studies: Dg Chest 1 View  08/10/2014   CLINICAL DATA:  Chronic low back pain, now with hip pain  EXAM: CHEST  1 VIEW  COMPARISON:  06/19/2014  FINDINGS: Lungs are essentially clear. No focal consolidation. No pleural effusion or  pneumothorax.  The heart is top-normal in size.  Surgical clips overlying the neck.  IMPRESSION: No evidence of acute cardiopulmonary disease.   Electronically Signed   By: Charline Bills M.D.   On: 08/10/2014 13:43   US Abdomen Complete  08/11/2014   CLINICAL DATA:  Transaminitis  EXAM: ULTRASOUND ABDOMEN COMPLETE  COMPARISON:  None.  FINDINGS: Gallbladder: Multiple gallstones. Lung 7 mm adherent stone. No gallbladder wall thickening. No pericholecystic fluid.  Common bile duct: Diameter: 5.7 mm.  No evidence of a duct stone.  Liver: No focal lesion identified. Within normal limits in parenchymal echogenicity.  IVC: No abnormality visualized.  Pancreas: Limited visualization.  Portions seen are unremarkable.  Spleen: Size and appearance within normal limits.  Right Kidney: Length: 9.9 cm. Diffusely echogenic with diffuse cortical thinning. No hydronephrosis.  Left Kidney: Length: 9.9 cm. Diffusely echogenic with diffuse cortical thinning. No hydronephrosis. 2.6 cm lower pole cyst.  Abdominal aorta: No aneurysm visualized.  Other findings: None.  IMPRESSION: 1. No acute findings. 2. Cholelithiasis without evidence of acute cholecystitis. 3. End-stage renal disease with bilateral small echogenic kidneys. No hydronephrosis. Small left renal cyst. 4. Normal sonographic appearance of the liver.   Electronically Signed   By: Amie Portland M.D.   On: 08/11/2014 07:50   Nm Myocar Multi W/spect W/wall Motion / Ef  08/11/2014   CLINICAL DATA:  59 year old male with a history of palpitations.  Cardiovascular risk factors include smoking, diabetes, hypertension, coronary artery disease.  EXAM: MYOCARDIAL IMAGING WITH SPECT (REST AND PHARMACOLOGIC-STRESS)  GATED LEFT VENTRICULAR WALL MOTION STUDY  LEFT VENTRICULAR EJECTION FRACTION  TECHNIQUE: Standard myocardial SPECT imaging was performed after resting intravenous injection of 10 mCi Tc-26m sestamibi. Subsequently, intravenous infusion of Lexiscan was performed under  the supervision of the Cardiology staff. At peak effect of the drug, 30 mCi Tc-60m sestamibi was injected intravenously and standard myocardial SPECT imaging was performed. Quantitative gated imaging was also performed to evaluate left ventricular wall motion, and estimate left ventricular ejection fraction.  COMPARISON:  None.  FINDINGS: Perfusion: No decreased activity in the left ventricle on stress imaging to suggest reversible ischemia or infarction.  Wall Motion: Hypokinesia of the inferior septal wall.  Left Ventricular Ejection Fraction: 52 %  End diastolic volume 129 ml  End systolic volume 62 ml  IMPRESSION: 1. No reversible ischemia or infarction.  2. Hypokinesia of the inferior septal wall.  3. Left ventricular ejection fraction 52%  4. Low-risk stress test findings*.  *2012 Appropriate Use Criteria for Coronary Revascularization Focused Update: J Am Coll Cardiol. 2012;59(9):857-881. http://content.dementiazones.com.aspx?articleid=1201161   Electronically Signed   By: Gilmer Mor D.O.   On: 08/11/2014 13:52    Scheduled Meds: . antiseptic oral rinse  7 mL Mouth Rinse BID  . aspirin EC  325 mg Oral Daily  . Chlorhexidine Gluconate Cloth  6 each Topical Q0600  . folic acid  1 mg Intravenous Daily  . heparin  5,000 Units Subcutaneous 3 times per day  . insulin aspart  0-9 Units Subcutaneous TID WC  . latanoprost  1 drop Both Eyes QHS  . piperacillin-tazobactam (ZOSYN)  IV  2.25 g Intravenous 3 times per day  . sodium bicarbonate  650 mg Oral BID  . thiamine  100 mg Intravenous Daily   Continuous Infusions:   Principal Problem:   Sepsis associated hypotension Active Problems:   End-stage renal disease on hemodialysis   History of placement of stent in LAD coronary artery   HTN (hypertension)   Radicular leg pain   Right-sided low back pain with right-sided sciatica   Elevated troponin   Abnormal transaminases   Metabolic encephalopathy   Chest pain   Shock circulatory    Pain in the chest   Weakness    Time spent: 25 minutes    Alleigh Mollica  [facility] Pager [phone] If 7PM-7AM, please contact night-coverage at [email or web address], password [facility] 08/12/2014, 10:52 AM  LOS: 2 days

## 2014-08-12 NOTE — Progress Notes (Signed)
VASCULAR LAB PRELIMINARY  ARTERIAL  ABI completed:    RIGHT    LEFT    PRESSURE WAVEFORM  PRESSURE WAVEFORM  BRACHIAL 139 Triphasic BRACHIAL Dialylis access Triphasic  DP >303 Biphasic DP >306 Triphasic  PT 128 Triphasic PT 134 Biphasic  GREAT TOE 10 NA GREAT TOE 50 NA    RIGHT LEFT  ABI DP Calcified/ PT 0.92 TBI 0.07 DP Calcified/ PT 0.96 TBI 0.36  Technically difficult study due to constant movement of the feet especially in evaluating the great toes. ABI on the right PT indicates normal arterial flow with the DP being calcified. Left ABI of the PT indicates normal arterial flow with the DP being calcified. Great toe pressures bilaterally indicate inadequate perfusion.   Doylene Splinter, RVS 08/12/2014, 1:28 PM

## 2014-08-12 NOTE — Progress Notes (Signed)
Pt attempted MRI with mg of ativan wouldn't stay  In scanner per pt  Due to back pain -- put pt back in bed he fell right to sleep - per pt when woke he sts he can not do it. Last mri was done under GA doe pain and claustrophobia

## 2014-08-12 NOTE — Consult Note (Signed)
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  If desired up on discharge, consultation to a podiatrist for routine paring of the callous and follow up of instructions for avoidance of DFUs via proper-fitting footwear and provision of offloading inserts would be helpful.  If you agree, please order. WOC nursing team will not follow, but will remain available to this patient, the nursing and medical team.  Please re-consult if needed. Thanks, Ladona Mow, MSN, RN, GNP, [place], CWON-AP [phone])

## 2014-08-12 NOTE — Progress Notes (Signed)
TELEMETRY: Reviewed telemetry pt in NSR: Filed Vitals:   08/12/14 0009 08/12/14 0400 08/12/14 0652 08/12/14 0753  BP: 146/78 139/52  114/50  Pulse:  76  79  Temp:  97.8 F (36.6 C)  97.9 F (36.6 C)  TempSrc:  Oral  Oral  Resp: Height:      Weight:  253 lb 6.4 oz (114.941 kg)    SpO2:  100%  98%    Intake/Output Summary (Last 24 hours) at 08/12/14 0935 Last data filed at 08/12/14 0828  Gross per 24 hour  Intake    270 ml  Output      0 ml  Net    270 ml   Filed Weights   08/10/14 1713 08/11/14 0418 08/12/14 0400  Weight: 251 lb 1.6 oz (113.898 kg) 258 lb 4.8 oz (117.164 kg) 253 lb 6.4 oz (114.941 kg)    Subjective Complains of back, hip, shoulder and hand pain. No chest pain.    Marland Kitchen antiseptic oral rinse  7 mL Mouth Rinse BID  . aspirin EC  325 mg Oral Daily  . Chlorhexidine Gluconate Cloth  6 each Topical Q0600  . folic acid  1 mg Intravenous Daily  . heparin  5,000 Units Subcutaneous 3 times per day  . insulin aspart  0-9 Units Subcutaneous TID WC  . latanoprost  1 drop Both Eyes QHS  . piperacillin-tazobactam (ZOSYN)  IV  2.25 g Intravenous 3 times per day  . sodium bicarbonate  650 mg Oral BID  . thiamine  100 mg Intravenous Daily      LABS: Basic Metabolic Panel:  Recent Labs  16/10/96 0710 08/12/14 0400  NA 136 134*  K 4.7 4.4  CL 94* 93*  CO2 24 25  GLUCOSE 68* 81  BUN 33* 46*  CREATININE 6.25* 7.66*  CALCIUM 8.8 8.4   Liver Function Tests:  Recent Labs  08/11/14 0710 08/12/14 0400  AST 1218* 1533*  ALT 234* 283*  ALKPHOS 213* 203*  BILITOT 2.1* 1.8*  PROT 7.2 7.4  ALBUMIN 2.2* 2.1*    Recent Labs  08/10/14 1830  LIPASE 31   CBC:  Recent Labs  08/10/14 1248 08/11/14 0710 08/12/14 0400  WBC 14.8* 22.3* 20.6*  NEUTROABS 13.5*  --   --   HGB 12.1* 11.1* 10.5*  HCT 37.7* 33.7* 33.5*  MCV 95.7 96.0 96.5  PLT 253 268 302   Cardiac Enzymes:  Recent Labs  08/10/14 1830 08/11/14 1026 08/11/14 1542  08/11/14 2104  CKTOTAL 04540* 14273*  --   --   TROPONINI  --  1.98* 2.11* 2.37*   BNP: No results for input(s): PROBNP in the last 72 hours. D-Dimer: No results for input(s): DDIMER in the last 72 hours. Hemoglobin A1C: No results for input(s): HGBA1C in the last 72 hours. Fasting Lipid Panel: No results for input(s): CHOL, HDL, LDLCALC, TRIG, CHOLHDL, LDLDIRECT in the last 72 hours. Thyroid Function Tests: No results for input(s): TSH, T4TOTAL, T3FREE, THYROIDAB in the last 72 hours.  Invalid input(s): FREET3   Radiology/Studies:  Dg Chest 1 View  08/10/2014   CLINICAL DATA:  Chronic low back pain, now with hip pain  EXAM: CHEST  1 VIEW  COMPARISON:  06/19/2014  FINDINGS: Lungs are essentially clear. No focal consolidation. No pleural effusion or pneumothorax.  The heart is top-normal in size.  Surgical clips overlying the neck.  IMPRESSION: No evidence of acute cardiopulmonary disease.   Electronically Signed   By:  Charline Bills M.D.   On: 08/10/2014 13:43   US Abdomen Complete  08/11/2014   CLINICAL DATA:  Transaminitis  EXAM: ULTRASOUND ABDOMEN COMPLETE  COMPARISON:  None.  FINDINGS: Gallbladder: Multiple gallstones. Lung 7 mm adherent stone. No gallbladder wall thickening. No pericholecystic fluid.  Common bile duct: Diameter: 5.7 mm.  No evidence of a duct stone.  Liver: No focal lesion identified. Within normal limits in parenchymal echogenicity.  IVC: No abnormality visualized.  Pancreas: Limited visualization.  Portions seen are unremarkable.  Spleen: Size and appearance within normal limits.  Right Kidney: Length: 9.9 cm. Diffusely echogenic with diffuse cortical thinning. No hydronephrosis.  Left Kidney: Length: 9.9 cm. Diffusely echogenic with diffuse cortical thinning. No hydronephrosis. 2.6 cm lower pole cyst.  Abdominal aorta: No aneurysm visualized.  Other findings: None.  IMPRESSION: 1. No acute findings. 2. Cholelithiasis without evidence of acute cholecystitis. 3.  End-stage renal disease with bilateral small echogenic kidneys. No hydronephrosis. Small left renal cyst. 4. Normal sonographic appearance of the liver.   Electronically Signed   By: Amie Portland M.D.   On: 08/11/2014 07:50   Nm Myocar Multi W/spect W/wall Motion / Ef  08/11/2014   CLINICAL DATA:  59 year old male with a history of palpitations.  Cardiovascular risk factors include smoking, diabetes, hypertension, coronary artery disease.  EXAM: MYOCARDIAL IMAGING WITH SPECT (REST AND PHARMACOLOGIC-STRESS)  GATED LEFT VENTRICULAR WALL MOTION STUDY  LEFT VENTRICULAR EJECTION FRACTION  TECHNIQUE: Standard myocardial SPECT imaging was performed after resting intravenous injection of 10 mCi Tc-10m sestamibi. Subsequently, intravenous infusion of Lexiscan was performed under the supervision of the Cardiology staff. At peak effect of the drug, 30 mCi Tc-51m sestamibi was injected intravenously and standard myocardial SPECT imaging was performed. Quantitative gated imaging was also performed to evaluate left ventricular wall motion, and estimate left ventricular ejection fraction.  COMPARISON:  None.  FINDINGS: Perfusion: No decreased activity in the left ventricle on stress imaging to suggest reversible ischemia or infarction.  Wall Motion: Hypokinesia of the inferior septal wall.  Left Ventricular Ejection Fraction: 52 %  End diastolic volume 129 ml  End systolic volume 62 ml  IMPRESSION: 1. No reversible ischemia or infarction.  2. Hypokinesia of the inferior septal wall.  3. Left ventricular ejection fraction 52%  4. Low-risk stress test findings*.  *2012 Appropriate Use Criteria for Coronary Revascularization Focused Update: J Am Coll Cardiol. 2012;59(9):857-881. http://content.dementiazones.com.aspx?articleid=1201161   Electronically Signed   By: Gilmer Mor D.O.   On: 08/11/2014 13:52   Ecg: sinus tachy with PVCs. Lateral ST depression.  PHYSICAL EXAM General: Obese BM appears uncomfortable.  Head:  Normocephalic, atraumatic, sclera non-icteric, oropharynx is clear Neck: Negative for carotid bruits. JVD not elevated. No adenopathy Lungs: Clear bilaterally to auscultation without wheezes, rales, or rhonchi. Breathing is unlabored. Heart: RRR S1 S2 without murmurs, rubs, or gallops.  Abdomen: Soft, non-tender, non-distended with normoactive bowel sounds. No hepatomegaly. Extremities: No clubbing, cyanosis or edema.  Distal pedal pulses are 2+ and equal bilaterally. Neuro: Alert and oriented X 3. Moves all extremities spontaneously.   ASSESSMENT AND PLAN: 1. Chest pain with atypical features. Troponin mildly elevated but decreasing. Peak 2.37. Ecg does show lateral ST depression. Suspicion for ACS is relatively low. Myoview without ischemia or infarct. EF is normal. Would recommend continued medical therapy. OK to hold Effient for surgical procedure.  2. 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 S/p stent of LAD in Jan. 2015.  3. ESRD on hemodialysis. 4. Acute on chronic  back pain. 5. Transaminitis. 6. Morbid obesity.  7. HTN  Cardiology will sign off. Please call with questions.  Present on Admission:  . Elevated troponin . HTN (hypertension) . Radicular leg pain . Right-sided low back pain with right-sided sciatica . Abnormal transaminases . Sepsis associated hypotension . Metabolic encephalopathy . Chest pain  Signed, Kache Mcclurg [place], MDFACC 08/12/2014 9:35 AM

## 2014-08-12 NOTE — Progress Notes (Signed)
ADDENDUM [facility]  INFORMATION =  [facility] [name] , [place], Fri  3.5 hrs,  2.0 k bath / 3.5 Calcium,  EDW 116kg, Heparin 11,000 units,    Venofer 50mg  q weekly hd / 0 ESA/ L fem AVGG

## 2014-08-12 NOTE — Progress Notes (Signed)
Subjective:  Somewhat stronger but still weak/ no further Femoral avgg bleed this am  Objective Vital signs in last 24 hours: Filed Vitals:   08/12/14 0009 08/12/14 0400 08/12/14 0652 08/12/14 0753  BP: 146/78 139/52  114/50  Pulse:  76  79  Temp:  97.8 F (36.6 C)  97.9 F (36.6 C)  TempSrc:  Oral  Oral  Resp: Height:      Weight:  114.941 kg (253 lb 6.4 oz)    SpO2:  100%  98%   Weight change: 1.043 kg (2 lb 4.8 oz)  Physical Exam: General: Alert Obese,  NAD, cooperative Heart: RRR, no mur, gallop, rub Lungs: CTA bilat , unlabored breathing Abdomen: Obese, bs pos. ,soft , NT. ND Extremities:no pedal edema Dialysis Access: L fem avg pos. bruit   Problem/Plan: 1. ESRD- HD =4 X week Mon, Tue, wed, Fri/ k 4.4and vol. Okay today/ next HD Monday after fem Avgg Surgery  By [facility]  2. Bleeding  Femoral AVGG=  Dr. Myra Gianotti following/ noted surgery on hold sec taking Effient  3. Positive Troponins with ho CAD/  myoview  Neg //Card. Signed off-  4.Transaminitis- noted ^ with Hep C pos  / GI to see/ [place] Chololithiasis  No Cholecystitis  5. HTN/VOL- Hypotension on admit resolved with ER iv fluids after IV Fluids / 147/83 now / but [facility]  Day of admit  Pre=147/68 and post 160/99 leaving below his edw .7 ( edw 116kg) / sepsis issue with ^ wbc/ card with + trop. (As OP has had Chronic issues with excess vol gains and bp thus 4X per week)    6. ^ WBC- On iv antibiotics ,cultures pending/ ?? Infect Avgg  ?? With spont bleed/ on Iv Zosyn   7.  Anemia - OP ESA 0 / fe  weekly hd/ hgb 11.1>10.5   8.  Secondary hyperparathyroidism - No op vit D , On 3.5 Ca bath with low Ca / last pth 20/ no current binder Fu ca/phos lab s   9. 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/ chronic narcotics - needs PT help    Lenny Pastel, PA-C [facility] Beeper [phone] 08/12/2014,10:49 AM  LOS: 2 days   Labs: Basic Metabolic Panel:  Recent Labs Lab 08/10/14 1248 08/11/14 0710 08/12/14 0400  NA 135 136 134*  K 5.2* 4.7 4.4  CL 95* 94* 93*  CO2 GLUCOSE 90 68* 81  BUN 23 33* 46*  CREATININE 4.60* 6.25* 7.66*  CALCIUM 9.7 8.8 8.4   Liver Function Tests:  Recent Labs Lab 08/10/14 1248 08/11/14 0710 08/12/14 0400  AST 1223* 1218* 1533*  ALT 235* 234* 283*  ALKPHOS 248* 213* 203*  BILITOT 2.7* 2.1* 1.8*  PROT 7.9 7.2 7.4  ALBUMIN 2.5* 2.2* 2.1*    Recent Labs Lab 08/10/14 1830  LIPASE 31  CBC:  Recent Labs Lab 08/10/14 1248 08/11/14 0710 08/12/14 0400  WBC 14.8* 22.3* 20.6*  NEUTROABS 13.5*  --   --   HGB 12.1* 11.1* 10.5*  HCT 37.7* 33.7* 33.5*  MCV 95.7 96.0 96.5  PLT 253 268 302   Cardiac Enzymes:  Recent Labs Lab 08/10/14 1437 08/10/14 1830 08/11/14 1026 08/11/14 1542 08/11/14 2104  CKTOTAL  --  11720* 14273*  --   --   TROPONINI 0.12*  --  1.98* 2.11* 2.37*   CBG:  Recent Labs Lab  08/10/14 1708 08/11/14 2224 08/12/14 0756  GLUCAP 75 99 80    Studies/Results: Dg Chest 1 View  08/10/2014   CLINICAL DATA:  Chronic low back pain, now with hip pain  EXAM: CHEST  1 VIEW  COMPARISON:  06/19/2014  FINDINGS: Lungs are essentially clear. No focal consolidation. No pleural effusion or pneumothorax.  The heart is top-normal in size.  Surgical clips overlying the neck.  IMPRESSION: No evidence of acute cardiopulmonary disease.   Electronically Signed   By: Charline Bills M.D.   On: 08/10/2014 13:43   US Abdomen Complete  08/11/2014   CLINICAL DATA:  Transaminitis  EXAM: ULTRASOUND ABDOMEN COMPLETE  COMPARISON:  None.  FINDINGS: Gallbladder: Multiple gallstones. Lung 7 mm adherent stone. No gallbladder wall thickening. No pericholecystic fluid.  Common bile duct: Diameter: 5.7 mm.  No evidence of a duct stone.  Liver: No focal lesion identified. Within normal limits in  parenchymal echogenicity.  IVC: No abnormality visualized.  Pancreas: Limited visualization.  Portions seen are unremarkable.  Spleen: Size and appearance within normal limits.  Right Kidney: Length: 9.9 cm. Diffusely echogenic with diffuse cortical thinning. No hydronephrosis.  Left Kidney: Length: 9.9 cm. Diffusely echogenic with diffuse cortical thinning. No hydronephrosis. 2.6 cm lower pole cyst.  Abdominal aorta: No aneurysm visualized.  Other findings: None.  IMPRESSION: 1. No acute findings. 2. Cholelithiasis without evidence of acute cholecystitis. 3. End-stage renal disease with bilateral small echogenic kidneys. No hydronephrosis. Small left renal cyst. 4. Normal sonographic appearance of the liver.   Electronically Signed   By: Amie Portland M.D.   On: 08/11/2014 07:50   Nm Myocar Multi W/spect W/wall Motion / Ef  08/11/2014   CLINICAL DATA:  59 year old male with a history of palpitations.  Cardiovascular risk factors include smoking, diabetes, hypertension, coronary artery disease.  EXAM: MYOCARDIAL IMAGING WITH SPECT (REST AND PHARMACOLOGIC-STRESS)  GATED LEFT VENTRICULAR WALL MOTION STUDY  LEFT VENTRICULAR EJECTION FRACTION  TECHNIQUE: Standard myocardial SPECT imaging was performed after resting intravenous injection of 10 mCi Tc-39m sestamibi. Subsequently, intravenous infusion of Lexiscan was performed under the supervision of the Cardiology staff. At peak effect of the drug, 30 mCi Tc-87m sestamibi was injected intravenously and standard myocardial SPECT imaging was performed. Quantitative gated imaging was also performed to evaluate left ventricular wall motion, and estimate left ventricular ejection fraction.  COMPARISON:  None.  FINDINGS: Perfusion: No decreased activity in the left ventricle on stress imaging to suggest reversible ischemia or infarction.  Wall Motion: Hypokinesia of the inferior septal wall.  Left Ventricular Ejection Fraction: 52 %  End diastolic volume 129 ml  End  systolic volume 62 ml  IMPRESSION: 1. No reversible ischemia or infarction.  2. Hypokinesia of the inferior septal wall.  3. Left ventricular ejection fraction 52%  4. Low-risk stress test findings*.  *2012 Appropriate Use Criteria for Coronary Revascularization Focused Update: J Am Coll Cardiol. 2012;59(9):857-881. http://[email or web address]?articleid=[email or web address]   Electronically Signed   By: Gilmer Mor D.O.   On: 08/11/2014 13:52   Medications:   . antiseptic oral rinse  7 mL Mouth Rinse BID  . aspirin EC  325 mg Oral Daily  . Chlorhexidine Gluconate Cloth  6 each Topical Q0600  . folic acid  1 mg Intravenous Daily  . heparin  5,000 Units Subcutaneous 3 times per day  . insulin aspart  0-9 Units Subcutaneous TID WC  . latanoprost  1 drop Both Eyes QHS  . piperacillin-tazobactam (ZOSYN)  IV  2.25 g Intravenous 3 times  per day  . sodium bicarbonate  650 mg Oral BID  . thiamine  100 mg Intravenous Daily

## 2014-08-12 NOTE — Consult Note (Signed)
[facility] CONSULT Reason for consult: Abnormal Liver Test Referring Physician: [facility] hospitalist. PCP: nephrology  [name] is an 59 y.o. male.  HPI: very complicated history. ESRD for many years on hemodialysis with multiple vascular issues with multiple vascular procedures. Most recently has graft in his left thigh that is likely infected. He has CAD, type II diabetes, hypertension, peripheral neuropathy, chronic back pain and arthritis requiring medications. He has had multiple vascular access related procedures as well as hip surgery. He has coronary stents. The patient apparently also had a history of hepatitis B in the distant past. He was admitted from the emergency room after having some chest pain. Apparently there were times when he was hypotensive. His initial blood pressure was okay but apparently this dropped to 78/34 requiring fluid bolus. All this is now better. Apparently, the feeling was that he had sepsis. Source was unclear. Liver test have been elevated. Transaminases AST 1533, ALT to 83, total bilirubin 1.8. Ultrasound revealed gallstones with normal CBD and normal liver parenchyma. In addition his CK is markedly elevated 15,000 troponin I is up slightly. Hepatitis C antibody is positive hepatitis B NA antibodies are negative we were asked to see about his liver test elevation, hepatitis C, question acute hepatitis C, cholangitis etc. The patient adamantly denies any right upper quadrant pain for nausea. He has had the hepatitis B presumably related to dialysis. He has never been a drug user or had any sexual exposure to any known drug users or anyone with hepatitis C  Past Medical History  Diagnosis Date  . Anxiety   . Back pain   . GERD (gastroesophageal reflux disease)   . Peripheral neuropathy   . Arthritis   . Chronic diastolic CHF (congestive heart failure)     a. 04/2013 Echo: EF nl, no rwma, mild LVH.  Marland Kitchen Active smoker   . ESRD on hemodialysis     [name] 4 days per week on M-Tu-Wed and Fri.  Started HD in 1998 and has been on HD initially at [facility], then went to [facility], then in [facility], then to [facility] and now is at [facility] for last 10 years.  Has L thigh AVG.     . Diabetes mellitus     diet controlled  . 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 PONV (postoperative nausea and vomiting)   . Hypertension   . Bell palsy   . Carpal tunnel syndrome     bilateral  . Palpitations   . Headache(784.0)     Hx: of Migraines  . CAD (coronary artery disease)     a. 04/2013 Ant STEMI/PCI: LM nl, LAD 95p (3.0x18 Xience DES), D1/2/3 small, LCX 80ost, RI 20p, RCA 158m CTO (unsuccessful PCI), EF 55%, mod basal inf HK.    Past Surgical History  Procedure Laterality Date  . Total hip arthroplasty    . Thyroidectomy    . Tooth extraction    . Mandible fracture surgery    . Dg av dialysis graft declot or    . Insertion of dialysis catheter  03/28/2011    Procedure: INSERTION OF DIALYSIS CATHETER;  Surgeon: Mal Misty, MD;  Location: [place];  Service: Vascular;  Laterality: Left;  . Av fistula placement  03/31/2011    Procedure: INSERTION OF ARTERIOVENOUS (AV) GORE-TEX GRAFT THIGH;  Surgeon: Elam Dutch, MD;  Location: [place];  Service: Vascular;  Laterality:  Right;  redo right thigh arteriovenous gortex graft using gore-tex stretch 70mm x 70cm  . Thrombectomy w/ embolectomy  06/02/2011    Procedure: THROMBECTOMY ARTERIOVENOUS GORE-TEX GRAFT;  Surgeon: Angelia Mould, MD;  Location: [place];  Service: Vascular;  Laterality: Right;  Thrombectomy right thigh arteriovenous gortex graft;  revision by  replacement of large portion of graft with 22mm gore-tex   . Insertion of dialysis catheter  08/28/2011    Procedure: INSERTION OF DIALYSIS CATHETER;  Surgeon: Conrad Grand View, MD;  Location: [place];  Service: Vascular;  Laterality: Left;  Atempted Bilateral Internal Jugular, Bilateral Subclavin insertion of 55cm Dialysis Catheter  Left Femoral  . Insertion of dialysis catheter  12/15/2011    Procedure: INSERTION OF DIALYSIS CATHETER;  Surgeon: Elam Dutch, MD;  Location: [place];  Service: Vascular;  Laterality: Right;  Insertion of Right Femoral Dialysis Catheter  . Exchange of a dialysis catheter  02/26/2012    Procedure: EXCHANGE OF A DIALYSIS CATHETER;  Surgeon: Rosetta Posner, MD;  Location: [place];  Service: Vascular;  Laterality: Right;  . Joint replacement    . Exchange of a dialysis catheter  05/18/2012    Procedure: EXCHANGE OF A DIALYSIS CATHETER;  Surgeon: Rosetta Posner, MD;  Location: [place];  Service: Vascular;  Laterality: Right;  right femoral dialysis catheter  . Av fistula placement  05/18/2012    Procedure: INSERTION OF ARTERIOVENOUS (AV) GORE-TEX GRAFT THIGH;  Surgeon: Rosetta Posner, MD;  Location: [facility] OR;  Service: Vascular;  Laterality: Left;  using 31mm by 70cm goretex graft  . Thrombectomy w/ embolectomy Left 08/25/2012    Procedure: THROMBECTOMY ARTERIOVENOUS GORE-TEX GRAFT;  Surgeon: Mal Misty, MD;  Location: [place];  Service: Vascular;  Laterality: Left;  Attempted thrombectomy of left thigh arteriovenous gortex graft.   . Av fistula placement Left 08/25/2012    Procedure: INSERTION OF ARTERIOVENOUS (AV) GORE-TEX GRAFT THIGH;  Surgeon: Mal Misty, MD;  Location: [facility] OR;  Service: Vascular;  Laterality: Left;  Using 41mm x 40cm vascular Gortex graft.  . Revision of arteriovenous goretex graft Left 12/14/2012    Procedure: Revision of Left Thigh Graft;  Surgeon: Rosetta Posner, MD;  Location: [place];  Service: Vascular;  Laterality: Left;  . Avgg removal Left 02/16/2013    Procedure: EXCISION OF LEFT ARM ARTERIOVENOUS GORETEX GRAFT TIMES 2 WITH VEIN PATCH ANGIOPLASTY OF BRACIAL ARTERY.  ;  Surgeon: Angelia Mould, MD;  Location: [place];  Service: Vascular;  Laterality: Left;  Converted from [place] to General.    . Revision of arteriovenous goretex graft Left 08/08/2013    Procedure: REVISION OF LEFT THIGH  ARTERIOVENOUS GORETEX GRAFT;  Surgeon: Elam Dutch, MD;  Location: [place];  Service: Vascular;  Laterality: Left;  . Venogram Bilateral 10/27/2011    Procedure: VENOGRAM;  Surgeon: Serafina Mitchell, MD;  Location: [facility] CATH LAB;  Service: Cardiovascular;  Laterality: Bilateral;  bilat upper extrem venograms  . Left heart cath Bilateral 05/21/2013    Procedure: LEFT HEART CATH;  Surgeon: Wellington Hampshire, MD;  Location: [facility] CATH LAB;  Service: Cardiovascular;  Laterality: Bilateral;  . Percutaneous coronary stent intervention (pci-s)  05/21/2013    Procedure: PERCUTANEOUS CORONARY STENT INTERVENTION (PCI-S);  Surgeon: Wellington Hampshire, MD;  Location: [facility] CATH LAB;  Service: Cardiovascular;;  . Coronary angioplasty with stent placement    . Radiology with anesthesia N/A 06/27/2014    Procedure: MRI LUMBER WITHOUT CONTRAST;  Surgeon: Medication  Radiologist, MD;  Location: [place];  Service: Radiology;  Laterality: N/A;    Family History  Problem Relation Age of Onset  . Diabetes Mother   . Stroke Mother   . Heart disease Mother   . Kidney disease Father   . Hyperlipidemia Father     Social History:  reports that he has been smoking Cigarettes.  He has a 8 pack-year smoking history. He has never used smokeless tobacco. He reports that he does not drink alcohol or use illicit drugs.  Allergies: No Known Allergies  Medications; Prior to Admission medications   Medication Sig Start Date End Date Taking? Authorizing Provider  Amino Acids-Protein Hydrolys (FEEDING SUPPLEMENT, PRO-STAT SUGAR FREE 64,) LIQD Take 30 mLs by mouth 2 (two) times daily.   Yes Historical Provider, MD  aspirin 81 MG tablet Take 1 tablet (81 mg total) by mouth daily. 05/24/13  Yes Rhonda G Barrett, PA-C  atorvastatin (LIPITOR) 80 MG tablet Take 1 tablet (80 mg total) by mouth daily at 6 PM. 05/24/13  Yes Rhonda G Barrett, PA-C  B Complex-C (B-COMPLEX WITH VITAMIN C) tablet Take 1 tablet by mouth at bedtime.   Yes Historical  Provider, MD  calcium carbonate (TUMS - DOSED IN MG ELEMENTAL CALCIUM) 500 MG chewable tablet Chew 3 tablets by mouth 3 (three) times daily.   Yes Historical Provider, MD  colchicine 0.6 MG tablet Take 0.6 mg by mouth every 12 (twelve) hours as needed (gout).    Yes Historical Provider, MD  dorzolamide-timolol (COSOPT) 22.3-6.8 MG/ML ophthalmic solution Place 1 drop into both eyes 3 (three) times daily.   Yes Historical Provider, MD  escitalopram (LEXAPRO) 10 MG tablet Take 10 mg by mouth daily.   Yes Historical Provider, MD  fentaNYL (DURAGESIC - DOSED MCG/HR) 50 MCG/HR Apply one patch topically every 72 hours for pain Patient taking differently: Place 50 mcg onto the skin every 3 (three) days. Apply at bedtime 07/17/14  Yes Mahima Bubba Camp, MD  gabapentin (NEURONTIN) 100 MG capsule Take 100 mg by mouth every 12 (twelve) hours as needed (neuropathy).  12/29/13  Yes Historical Provider, MD  latanoprost (XALATAN) 0.005 % ophthalmic solution Place 1 drop into both eyes at bedtime.  04/20/14  Yes Historical Provider, MD  loperamide (IMODIUM) 2 MG capsule Take 4 mg by mouth as needed for diarrhea or loose stools.   Yes Historical Provider, MD  LORazepam (ATIVAN) 0.5 MG tablet Take 0.5 mg by mouth every 8 (eight) hours as needed for anxiety.   Yes Historical Provider, MD  Menthol, Topical Analgesic, 4 % GEL Apply 1 application topically every 6 (six) hours as needed (pain). Apply to shoulders/lower back   Yes Historical Provider, MD  naproxen sodium (ANAPROX) 220 MG tablet Take 440 mg by mouth 2 (two) times daily with a meal.   Yes Historical Provider, MD  omeprazole (PRILOSEC) 40 MG capsule Take 40 mg by mouth at bedtime.    Yes Historical Provider, MD  prasugrel (EFFIENT) 10 MG TABS tablet Take 1 tablet (10 mg total) by mouth daily. 06/16/13  Yes Pixie Casino, MD  sodium bicarbonate 650 MG tablet Take 650 mg by mouth 2 (two) times daily.     Yes Historical Provider, MD  sulindac (CLINORIL) 200 MG tablet  Take 200 mg by mouth 2 (two) times daily.   Yes Historical Provider, MD  nitroGLYCERIN (NITROSTAT) 0.4 MG SL tablet Place 1 tablet (0.4 mg total) under the tongue every 5 (five) minutes as needed for chest pain.  Patient not taking: Reported on 08/11/2014 05/24/13   Evelene Croon Barrett, PA-C  oxyCODONE (OXY IR/ROXICODONE) 5 MG immediate release tablet Take 1-2 tablets (5-10 mg total) by mouth every 4 (four) hours as needed for moderate pain. Patient not taking: Reported on 08/11/2014 07/17/14   Blanchie Serve, MD   . antiseptic oral rinse  7 mL Mouth Rinse BID  . aspirin EC  325 mg Oral Daily  . Chlorhexidine Gluconate Cloth  6 each Topical Q0600  . folic acid  1 mg Intravenous Daily  . heparin  5,000 Units Subcutaneous 3 times per day  . insulin aspart  0-9 Units Subcutaneous TID WC  . latanoprost  1 drop Both Eyes QHS  . sodium bicarbonate  650 mg Oral BID  . thiamine  100 mg Intravenous Daily   PRN Meds fentaNYL (SUBLIMAZE) injection, ondansetron **OR** ondansetron (ZOFRAN) IV Results for orders placed or performed during the hospital encounter of 08/10/14 (from the past 48 hour(s))  CBC with Differential     Status: Abnormal   Collection Time: 08/10/14 12:48 PM  Result Value Ref Range   WBC 14.8 (H) 4.0 - 10.5 K/uL   RBC 3.94 (L) 4.22 - 5.81 MIL/uL   Hemoglobin 12.1 (L) 13.0 - 17.0 g/dL   HCT 37.7 (L) 39.0 - 52.0 %   MCV 95.7 78.0 - 100.0 fL   MCH 30.7 26.0 - 34.0 pg   MCHC 32.1 30.0 - 36.0 g/dL   RDW 16.7 (H) 11.5 - 15.5 %   Platelets 253 150 - 400 K/uL   Neutrophils Relative % 91 (H) 43 - 77 %   Neutro Abs 13.5 (H) 1.7 - 7.7 K/uL   Lymphocytes Relative 3 (L) 12 - 46 %   Lymphs Abs 0.4 (L) 0.7 - 4.0 K/uL   Monocytes Relative 2 (L) 3 - 12 %   Monocytes Absolute 0.3 0.1 - 1.0 K/uL   Eosinophils Relative 4 0 - 5 %   Eosinophils Absolute 0.6 0.0 - 0.7 K/uL   Basophils Relative 0 0 - 1 %   Basophils Absolute 0.0 0.0 - 0.1 K/uL  Comprehensive metabolic panel     Status: Abnormal    Collection Time: 08/10/14 12:48 PM  Result Value Ref Range   Sodium 135 135 - 145 mmol/L   Potassium 5.2 (H) 3.5 - 5.1 mmol/L   Chloride 95 (L) 96 - 112 mmol/L   CO2 28 19 - 32 mmol/L   Glucose, Bld 90 70 - 99 mg/dL   BUN 23 6 - 23 mg/dL   Creatinine, Ser 4.60 (H) 0.50 - 1.35 mg/dL   Calcium 9.7 8.4 - 10.5 mg/dL   Total Protein 7.9 6.0 - 8.3 g/dL   Albumin 2.5 (L) 3.5 - 5.2 g/dL   AST 1223 (H) 0 - 37 U/L   ALT 235 (H) 0 - 53 U/L   Alkaline Phosphatase 248 (H) 39 - 117 U/L   Total Bilirubin 2.7 (H) 0.3 - 1.2 mg/dL   GFR calc non Af Amer 13 (L) >90 mL/min   GFR calc Af Amer 15 (L) >90 mL/min    Comment: (NOTE) The eGFR has been calculated using the CKD EPI equation. This calculation has not been validated in all clinical situations. eGFR's persistently <90 mL/min signify possible Chronic Kidney Disease.    Anion gap 12 5 - 15  Protime-INR     Status: None   Collection Time: 08/10/14 12:48 PM  Result Value Ref Range   Prothrombin Time 14.4 11.6 -  15.2 seconds   INR 1.11 0.00 - 1.49  APTT     Status: Abnormal   Collection Time: 08/10/14 12:48 PM  Result Value Ref Range   aPTT 49 (H) 24 - 37 seconds    Comment:        IF BASELINE aPTT IS ELEVATED, SUGGEST PATIENT RISK ASSESSMENT BE USED TO DETERMINE APPROPRIATE ANTICOAGULANT THERAPY.   I-stat troponin, ED     Status: Abnormal   Collection Time: 08/10/14 12:52 PM  Result Value Ref Range   Troponin i, poc 0.19 (HH) 0.00 - 0.08 ng/mL   Comment NOTIFIED PHYSICIAN    Comment 3            Comment: Due to the release kinetics of cTnI, a negative result within the first hours of the onset of symptoms does not rule out myocardial infarction with certainty. If myocardial infarction is still suspected, repeat the test at appropriate intervals.   I-Stat CG4 Lactic Acid, ED     Status: None   Collection Time: 08/10/14 12:54 PM  Result Value Ref Range   Lactic Acid, Venous 1.44 0.5 - 2.0 mmol/L  Troponin I     Status: Abnormal    Collection Time: 08/10/14  2:37 PM  Result Value Ref Range   Troponin I 0.12 (H) <0.031 ng/mL    Comment:        PERSISTENTLY INCREASED TROPONIN VALUES IN THE RANGE OF 0.04-0.49 ng/mL CAN BE SEEN IN:       -UNSTABLE ANGINA       -CONGESTIVE HEART FAILURE       -MYOCARDITIS       -CHEST TRAUMA       -ARRYHTHMIAS       -LATE PRESENTING MYOCARDIAL INFARCTION       -COPD   CLINICAL FOLLOW-UP RECOMMENDED.   Lactic acid, plasma     Status: None   Collection Time: 08/10/14  4:43 PM  Result Value Ref Range   Lactic Acid, Venous 1.8 0.5 - 2.0 mmol/L  Acetaminophen level     Status: Abnormal   Collection Time: 08/10/14  4:43 PM  Result Value Ref Range   Acetaminophen (Tylenol), Serum <10.0 (L) 10 - 30 ug/mL    Comment:        THERAPEUTIC CONCENTRATIONS VARY SIGNIFICANTLY. A RANGE OF 10-30 ug/mL MAY BE AN EFFECTIVE CONCENTRATION FOR MANY PATIENTS. HOWEVER, SOME ARE BEST TREATED AT CONCENTRATIONS OUTSIDE THIS RANGE. ACETAMINOPHEN CONCENTRATIONS >150 ug/mL AT 4 HOURS AFTER INGESTION AND >50 ug/mL AT 12 HOURS AFTER INGESTION ARE OFTEN ASSOCIATED WITH TOXIC REACTIONS.   Glucose, capillary     Status: None   Collection Time: 08/10/14  5:08 PM  Result Value Ref Range   Glucose-Capillary 75 70 - 99 mg/dL  MRSA PCR Screening     Status: None   Collection Time: 08/10/14  5:33 PM  Result Value Ref Range   MRSA by PCR NEGATIVE NEGATIVE    Comment:        The GeneXpert MRSA Assay (FDA approved for NASAL specimens only), is one component of a comprehensive MRSA colonization surveillance program. It is not intended to diagnose MRSA infection nor to guide or monitor treatment for MRSA infections.   Culture, blood (routine x 2)     Status: None (Preliminary result)   Collection Time: 08/10/14  6:08 PM  Result Value Ref Range   Specimen Description BLOOD LEFT ANTECUBITAL    Special Requests      BOTTLES DRAWN AEROBIC AND ANAEROBIC 10  CC BLUE 8CC RED   Culture             BLOOD  CULTURE RECEIVED NO GROWTH TO DATE CULTURE WILL BE HELD FOR 5 DAYS BEFORE ISSUING A FINAL NEGATIVE REPORT Performed at [facility]    Report Status PENDING   Culture, blood (routine x 2)     Status: None (Preliminary result)   Collection Time: 08/10/14  6:18 PM  Result Value Ref Range   Specimen Description BLOOD RIGHT HAND    Special Requests BOTTLES DRAWN AEROBIC ONLY 8 CC    Culture             BLOOD CULTURE RECEIVED NO GROWTH TO DATE CULTURE WILL BE HELD FOR 5 DAYS BEFORE ISSUING A FINAL NEGATIVE REPORT Performed at [facility]    Report Status PENDING   Lipase, blood     Status: None   Collection Time: 08/10/14  6:30 PM  Result Value Ref Range   Lipase 31 11 - 59 U/L  CK     Status: Abnormal   Collection Time: 08/10/14  6:30 PM  Result Value Ref Range   Total CK 11720 (H) 7 - 232 U/L    Comment: RESULTS CONFIRMED BY MANUAL DILUTION  Lactic acid, plasma     Status: None   Collection Time: 08/10/14  6:30 PM  Result Value Ref Range   Lactic Acid, Venous 1.8 0.5 - 2.0 mmol/L  Procalcitonin     Status: None   Collection Time: 08/10/14  6:30 PM  Result Value Ref Range   Procalcitonin 8.76 ng/mL    Comment:        Interpretation: PCT > 2 ng/mL: Systemic infection (sepsis) is likely, unless other causes are known. (NOTE)         ICU PCT Algorithm               Non ICU PCT Algorithm    ----------------------------     ------------------------------         PCT < 0.25 ng/mL                 PCT < 0.1 ng/mL     Stopping of antibiotics            Stopping of antibiotics       strongly encouraged.               strongly encouraged.    ----------------------------     ------------------------------       PCT level decrease by               PCT < 0.25 ng/mL       >= 80% from peak PCT       OR PCT 0.25 - 0.5 ng/mL          Stopping of antibiotics                                             encouraged.     Stopping of antibiotics           encouraged.     ----------------------------     ------------------------------       PCT level decrease by              PCT >= 0.25 ng/mL       < 80% from peak PCT  AND PCT >= 0.5 ng/mL            Continuing antibiotics                                               encouraged.       Continuing antibiotics            encouraged.    ----------------------------     ------------------------------     PCT level increase compared          PCT > 0.5 ng/mL         with peak PCT AND          PCT >= 0.5 ng/mL             Escalation of antibiotics                                          strongly encouraged.      Escalation of antibiotics        strongly encouraged.   Protime-INR     Status: None   Collection Time: 08/10/14  6:30 PM  Result Value Ref Range   Prothrombin Time 14.3 11.6 - 15.2 seconds   INR 1.10 0.00 - 1.49  APTT     Status: None   Collection Time: 08/10/14  6:30 PM  Result Value Ref Range   aPTT 34 24 - 37 seconds  Lactic acid, plasma     Status: None   Collection Time: 08/10/14  9:27 PM  Result Value Ref Range   Lactic Acid, Venous 1.4 0.5 - 2.0 mmol/L  Hepatitis panel, acute     Status: Abnormal   Collection Time: 08/10/14  9:27 PM  Result Value Ref Range   Hepatitis B Surface Ag NEGATIVE NEGATIVE   HCV Ab Reactive (A) NEGATIVE   Hep A IgM NON REACTIVE NON REACTIVE    Comment: (NOTE) Effective March 12, 2014, Hepatitis Acute Panel (test code 45893) will be revised to automatically reflex to the Hepatitis C Viral RNA, Quantitative, Real-Time PCR assay if the Hepatitis C antibody screening result is Reactive. This action is being taken to ensure that the CDC/USPSTF recommended HCV diagnostic algorithm with the appropriate test reflex needed for accurate interpretation is followed.    Hep B C IgM NON REACTIVE NON REACTIVE    Comment: (NOTE) High levels of Hepatitis B Core IgM antibody are detectable during the acute stage of Hepatitis B. This antibody is used to  differentiate current from past HBV infection. Performed at [facility]   Mononucleosis screen     Status: None   Collection Time: 08/10/14 10:48 PM  Result Value Ref Range   Mono Screen NEGATIVE NEGATIVE  Comprehensive metabolic panel     Status: Abnormal   Collection Time: 08/11/14  7:10 AM  Result Value Ref Range   Sodium 136 135 - 145 mmol/L   Potassium 4.7 3.5 - 5.1 mmol/L   Chloride 94 (L) 96 - 112 mmol/L   CO2 24 19 - 32 mmol/L   Glucose, Bld 68 (L) 70 - 99 mg/dL   BUN 33 (H) 6 - 23 mg/dL   Creatinine, Ser 5.82 (H) 0.50 - 1.35 mg/dL   Calcium 8.8 8.4 - 51.4 mg/dL  Total Protein 7.2 6.0 - 8.3 g/dL   Albumin 2.2 (L) 3.5 - 5.2 g/dL   AST 1218 (H) 0 - 37 U/L   ALT 234 (H) 0 - 53 U/L   Alkaline Phosphatase 213 (H) 39 - 117 U/L   Total Bilirubin 2.1 (H) 0.3 - 1.2 mg/dL   GFR calc non Af Amer 9 (L) >90 mL/min   GFR calc Af Amer 10 (L) >90 mL/min    Comment: (NOTE) The eGFR has been calculated using the CKD EPI equation. This calculation has not been validated in all clinical situations. eGFR's persistently <90 mL/min signify possible Chronic Kidney Disease.    Anion gap 18 (H) 5 - 15  CBC     Status: Abnormal   Collection Time: 08/11/14  7:10 AM  Result Value Ref Range   WBC 22.3 (H) 4.0 - 10.5 K/uL   RBC 3.51 (L) 4.22 - 5.81 MIL/uL   Hemoglobin 11.1 (L) 13.0 - 17.0 g/dL   HCT 33.7 (L) 39.0 - 52.0 %   MCV 96.0 78.0 - 100.0 fL   MCH 31.6 26.0 - 34.0 pg   MCHC 32.9 30.0 - 36.0 g/dL   RDW 17.3 (H) 11.5 - 15.5 %   Platelets 268 150 - 400 K/uL  Protime-INR     Status: None   Collection Time: 08/11/14  7:10 AM  Result Value Ref Range   Prothrombin Time 14.3 11.6 - 15.2 seconds   INR 1.09 0.00 - 1.49  APTT     Status: Abnormal   Collection Time: 08/11/14  7:10 AM  Result Value Ref Range   aPTT 38 (H) 24 - 37 seconds    Comment:        IF BASELINE aPTT IS ELEVATED, SUGGEST PATIENT RISK ASSESSMENT BE USED TO DETERMINE APPROPRIATE ANTICOAGULANT THERAPY.    Troponin I (q 6hr x 3)     Status: Abnormal   Collection Time: 08/11/14 10:26 AM  Result Value Ref Range   Troponin I 1.98 (HH) <0.031 ng/mL    Comment:        POSSIBLE MYOCARDIAL ISCHEMIA. SERIAL TESTING RECOMMENDED. REPEATED TO VERIFY CRITICAL RESULT CALLED TO, READ BACK BY AND VERIFIED WITH: RN A TETE AT [id number] MARTINB   CK     Status: Abnormal   Collection Time: 08/11/14 10:26 AM  Result Value Ref Range   [place] [address] (H) 7 - 232 U/L    Comment: RESULTS CONFIRMED BY MANUAL DILUTION  Troponin I (q 6hr x 3)     Status: Abnormal   Collection Time: 08/11/14  3:42 PM  Result Value Ref Range   Troponin I 2.11 (HH) <0.031 ng/mL    Comment:        POSSIBLE MYOCARDIAL ISCHEMIA. SERIAL TESTING RECOMMENDED. REPEATED TO VERIFY CRITICAL VALUE NOTED.  VALUE IS CONSISTENT WITH PREVIOUSLY REPORTED AND CALLED VALUE.   Troponin I (q 6hr x 3)     Status: Abnormal   Collection Time: 08/11/14  9:04 PM  Result Value Ref Range   Troponin I 2.37 (HH) <0.031 ng/mL    Comment:        POSSIBLE MYOCARDIAL ISCHEMIA. SERIAL TESTING RECOMMENDED. REPEATED TO VERIFY CRITICAL VALUE NOTED.  VALUE IS CONSISTENT WITH PREVIOUSLY REPORTED AND CALLED VALUE.   Glucose, capillary     Status: None   Collection Time: 08/11/14 10:24 PM  Result Value Ref Range   Glucose-Capillary 99 70 - 99 mg/dL  CBC     Status: Abnormal   Collection Time:  08/12/14  4:00 AM  Result Value Ref Range   WBC 20.6 (H) 4.0 - 10.5 K/uL   RBC 3.47 (L) 4.22 - 5.81 MIL/uL   Hemoglobin 10.5 (L) 13.0 - 17.0 g/dL   HCT 33.5 (L) 39.0 - 52.0 %   MCV 96.5 78.0 - 100.0 fL   MCH 30.3 26.0 - 34.0 pg   MCHC 31.3 30.0 - 36.0 g/dL   RDW 17.2 (H) 11.5 - 15.5 %   Platelets 302 150 - 400 K/uL  Comprehensive metabolic panel     Status: Abnormal   Collection Time: 08/12/14  4:00 AM  Result Value Ref Range   Sodium 134 (L) 135 - 145 mmol/L   Potassium 4.4 3.5 - 5.1 mmol/L   Chloride 93 (L) 96 - 112 mmol/L   CO2 25 19 - 32 mmol/L    Glucose, Bld 81 70 - 99 mg/dL   BUN 46 (H) 6 - 23 mg/dL   Creatinine, Ser 7.66 (H) 0.50 - 1.35 mg/dL   Calcium 8.4 8.4 - 10.5 mg/dL   Total Protein 7.4 6.0 - 8.3 g/dL   Albumin 2.1 (L) 3.5 - 5.2 g/dL   AST 1533 (H) 0 - 37 U/L   ALT 283 (H) 0 - 53 U/L   Alkaline Phosphatase 203 (H) 39 - 117 U/L   Total Bilirubin 1.8 (H) 0.3 - 1.2 mg/dL   GFR calc non Af Amer 7 (L) >90 mL/min   GFR calc Af Amer 8 (L) >90 mL/min    Comment: (NOTE) The eGFR has been calculated using the CKD EPI equation. This calculation has not been validated in all clinical situations. eGFR's persistently <90 mL/min signify possible Chronic Kidney Disease.    Anion gap 16 (H) 5 - 15  Glucose, capillary     Status: None   Collection Time: 08/12/14  7:56 AM  Result Value Ref Range   Glucose-Capillary 80 70 - 99 mg/dL    Dg Chest 1 View  08/10/2014   CLINICAL DATA:  Chronic low back pain, now with hip pain  EXAM: CHEST  1 VIEW  COMPARISON:  06/19/2014  FINDINGS: Lungs are essentially clear. No focal consolidation. No pleural effusion or pneumothorax.  The heart is top-normal in size.  Surgical clips overlying the neck.  IMPRESSION: No evidence of acute cardiopulmonary disease.   Electronically Signed   By: Julian Hy M.D.   On: 08/10/2014 13:43   US Abdomen Complete  08/11/2014   CLINICAL DATA:  Transaminitis  EXAM: ULTRASOUND ABDOMEN COMPLETE  COMPARISON:  None.  FINDINGS: Gallbladder: Multiple gallstones. Lung 7 mm adherent stone. No gallbladder wall thickening. No pericholecystic fluid.  Common bile duct: Diameter: 5.7 mm.  No evidence of a duct stone.  Liver: No focal lesion identified. Within normal limits in parenchymal echogenicity.  IVC: No abnormality visualized.  Pancreas: Limited visualization.  Portions seen are unremarkable.  Spleen: Size and appearance within normal limits.  Right Kidney: Length: 9.9 cm. Diffusely echogenic with diffuse cortical thinning. No hydronephrosis.  Left Kidney: Length: 9.9  cm. Diffusely echogenic with diffuse cortical thinning. No hydronephrosis. 2.6 cm lower pole cyst.  Abdominal aorta: No aneurysm visualized.  Other findings: None.  IMPRESSION: 1. No acute findings. 2. Cholelithiasis without evidence of acute cholecystitis. 3. End-stage renal disease with bilateral small echogenic kidneys. No hydronephrosis. Small left renal cyst. 4. Normal sonographic appearance of the liver.   Electronically Signed   By: Lajean Manes M.D.   On: 08/11/2014 07:50   Nm Myocar  Multi W/spect W/wall Motion / Ef  08/11/2014   CLINICAL DATA:  59 year old male with a history of palpitations.  Cardiovascular risk factors include smoking, diabetes, hypertension, coronary artery disease.  EXAM: MYOCARDIAL IMAGING WITH SPECT (REST AND PHARMACOLOGIC-STRESS)  GATED LEFT VENTRICULAR WALL MOTION STUDY  LEFT VENTRICULAR EJECTION FRACTION  TECHNIQUE: Standard myocardial SPECT imaging was performed after resting intravenous injection of 10 mCi Tc-38m sestamibi. Subsequently, intravenous infusion of Lexiscan was performed under the supervision of the Cardiology staff. At peak effect of the drug, 30 mCi Tc-33m sestamibi was injected intravenously and standard myocardial SPECT imaging was performed. Quantitative gated imaging was also performed to evaluate left ventricular wall motion, and estimate left ventricular ejection fraction.  COMPARISON:  None.  FINDINGS: Perfusion: No decreased activity in the left ventricle on stress imaging to suggest reversible ischemia or infarction.  Wall Motion: Hypokinesia of the inferior septal wall.  Left Ventricular Ejection Fraction: 52 %  End diastolic volume 758 ml  End systolic volume 62 ml  IMPRESSION: 1. No reversible ischemia or infarction.  2. Hypokinesia of the inferior septal wall.  3. Left ventricular ejection fraction 52%  4. Low-risk stress test findings*.  *2012 Appropriate Use Criteria for Coronary Revascularization Focused Update: J Am Coll Cardiol.  8325;49(8):264-158. http://content.airportbarriers.com.aspx?articleid=1201161   Electronically Signed   By: Corrie Mckusick D.O.   On: 08/11/2014 13:52   ROS: generally is noted above.             Blood pressure 114/50, pulse 79, temperature 97.9 F (36.6 C), temperature source Oral, resp. rate 18, height $RemoveBe'5\' 11"'RXVwuorMB$  (1.803 m), weight 114.941 kg (253 lb 6.4 oz), SpO2 98 %.  Physical exam:   General--somewhat obese African-American male in no acute distress ENT-- sclera nonicteric Neck-- no lymphadenopathy Heart-- regular rate and rhythm without murmurs are gallops Lungs--clear Abdomen-- none distended with excellent bowel sounds. No right upper quadrant tenderness and liver is not palpable. Psych-- alert and oriented and appropriate Extremities - examined with Dr. Tommy Medal, left eye has ulcerated area which appears infected   Assessment: 1. Abnormal liver test. This could all be due to rhabdomyolysis due to infection given his markedly elevated CK. His AST is elevated much more so than ALT. I suspect the hepatitis C is chronic in this does not represent acute hepatitis C. The ultrasound does show gallstones but there is no dilation of the bile duct is exam reveals no right upper quadrant tenderness. 2. Gallstones probably asymptomatic 3. Positive hepatitis C antibody. Will need to check for viral load and may need to be treated at some point in the future 4. History of hepatitis B in the past 5. ESRD on hemodialysis 5 CAD with stent placement  Plan: 1. We'll check hepatitis C viral load 2. Would go ahead at this time with surgical treatment of his infected vascular graft in the hopes that this will improve his sepsis and elevated CK and transaminases.   Cameo Shewell JR,Nyazia Canevari L 08/12/2014, 11:31 AM

## 2014-08-13 ENCOUNTER — Inpatient Hospital Stay (HOSPITAL_COMMUNITY): Payer: Medicare Other | Admitting: Anesthesiology

## 2014-08-13 ENCOUNTER — Encounter (HOSPITAL_COMMUNITY)
Admission: EM | Disposition: A | Discharge status: Skilled Nursing Facility | Payer: Self-pay | Source: Home / Self Care | Attending: Internal Medicine

## 2014-08-13 DIAGNOSIS — I10 Essential (primary) hypertension: Secondary | ICD-10-CM

## 2014-08-13 HISTORY — PX: REVISION OF ARTERIOVENOUS GORETEX GRAFT: SHX6073

## 2014-08-13 LAB — CBC
HCT: 33.3 % — ABNORMAL LOW (ref 39.0–52.0)
Hemoglobin: 10.5 g/dL — ABNORMAL LOW (ref 13.0–17.0)
MCH: 30.3 pg (ref 26.0–34.0)
MCHC: 31.5 g/dL (ref 30.0–36.0)
MCV: 96.2 fL (ref 78.0–100.0)
Platelets: 272 10*3/uL (ref 150–400)
RBC: 3.46 MIL/uL — AB (ref 4.22–5.81)
RDW: 17.3 % — AB (ref 11.5–15.5)
WBC: 16.5 10*3/uL — AB (ref 4.0–10.5)

## 2014-08-13 LAB — GLUCOSE, CAPILLARY
GLUCOSE-CAPILLARY: 82 mg/dL (ref 70–99)
GLUCOSE-CAPILLARY: 84 mg/dL (ref 70–99)
GLUCOSE-CAPILLARY: 72 mg/dL (ref 70–99)
Glucose-Capillary: 69 mg/dL — ABNORMAL LOW (ref 70–99)
Glucose-Capillary: 75 mg/dL (ref 70–99)

## 2014-08-13 LAB — C-REACTIVE PROTEIN: CRP: 7.2 mg/dL — AB (ref <0.60)

## 2014-08-13 LAB — HEPATIC FUNCTION PANEL
ALBUMIN: 2.1 g/dL — AB (ref 3.5–5.2)
ALT: 322 U/L — ABNORMAL HIGH (ref 0–53)
AST: 1608 U/L — AB (ref 0–37)
Alkaline Phosphatase: 195 U/L — ABNORMAL HIGH (ref 39–117)
Bilirubin, Direct: 0.4 mg/dL (ref 0.0–0.5)
Indirect Bilirubin: 1 mg/dL — ABNORMAL HIGH (ref 0.3–0.9)
Total Bilirubin: 1.4 mg/dL — ABNORMAL HIGH (ref 0.3–1.2)
Total Protein: 7.2 g/dL (ref 6.0–8.3)

## 2014-08-13 LAB — SEDIMENTATION RATE: Sed Rate: 104 mm/hr — ABNORMAL HIGH (ref 0–16)

## 2014-08-13 LAB — BASIC METABOLIC PANEL
ANION GAP: 19 — AB (ref 5–15)
BUN: 56 mg/dL — ABNORMAL HIGH (ref 6–23)
CO2: 22 mmol/L (ref 19–32)
CREATININE: 9.04 mg/dL — AB (ref 0.50–1.35)
Calcium: 8.1 mg/dL — ABNORMAL LOW (ref 8.4–10.5)
Chloride: 94 mmol/L — ABNORMAL LOW (ref 96–112)
GFR calc Af Amer: 7 mL/min — ABNORMAL LOW (ref >90)
GFR calc non Af Amer: 6 mL/min — ABNORMAL LOW (ref >90)
Glucose, Bld: 79 mg/dL (ref 70–99)
Potassium: 4.9 mmol/L (ref 3.5–5.1)
SODIUM: 135 mmol/L (ref 135–145)

## 2014-08-13 LAB — HEMOGLOBIN A1C
Hgb A1c MFr Bld: 5.3 % (ref 4.8–5.6)
Mean Plasma Glucose: 105 mg/dL

## 2014-08-13 LAB — CLOSTRIDIUM DIFFICILE BY PCR: Toxigenic C. Difficile by PCR: NEGATIVE

## 2014-08-13 LAB — HIV ANTIBODY (ROUTINE TESTING W REFLEX): HIV Screen 4th Generation wRfx: NONREACTIVE

## 2014-08-13 LAB — PROTIME-INR
INR: 1.08 (ref 0.00–1.49)
Prothrombin Time: 14.1 seconds (ref 11.6–15.2)

## 2014-08-13 LAB — PROCALCITONIN: Procalcitonin: 10.85 ng/mL

## 2014-08-13 SURGERY — REVISION OF ARTERIOVENOUS GORETEX GRAFT
Anesthesia: General | Site: Leg Upper | Laterality: Left

## 2014-08-13 MED ORDER — THROMBIN 20000 UNITS EX SOLR
CUTANEOUS | Status: DC | PRN
  Administered 2014-08-13 (×2): via TOPICAL

## 2014-08-13 MED ORDER — PROPOFOL 10 MG/ML IV BOLUS
INTRAVENOUS | Status: AC
  Filled 2014-08-13: qty 20

## 2014-08-13 MED ORDER — ARTIFICIAL TEARS OP OINT
TOPICAL_OINTMENT | OPHTHALMIC | Status: AC
  Filled 2014-08-13: qty 3.5

## 2014-08-13 MED ORDER — GLYCOPYRROLATE 0.2 MG/ML IJ SOLN
INTRAMUSCULAR | Status: AC
  Filled 2014-08-13: qty 2

## 2014-08-13 MED ORDER — HYDROMORPHONE HCL 1 MG/ML IJ SOLN
0.2500 mg | INTRAMUSCULAR | Status: DC | PRN
  Administered 2014-08-13 (×2): 0.5 mg via INTRAVENOUS

## 2014-08-13 MED ORDER — LIDOCAINE HCL (CARDIAC) 20 MG/ML IV SOLN
INTRAVENOUS | Status: DC | PRN
  Administered 2014-08-13: 100 mg via INTRAVENOUS

## 2014-08-13 MED ORDER — LIDOCAINE HCL (PF) 1 % IJ SOLN: 5.0000 mL | INTRAMUSCULAR | Status: DC | PRN

## 2014-08-13 MED ORDER — MEPERIDINE HCL 25 MG/ML IJ SOLN: 6.2500 mg | INTRAMUSCULAR | Status: DC | PRN

## 2014-08-13 MED ORDER — HYDROMORPHONE HCL 1 MG/ML IJ SOLN
INTRAMUSCULAR | Status: AC
Start: 2014-08-13 — End: 2014-08-13
  Administered 2014-08-13: 0.5 mg via INTRAVENOUS
  Filled 2014-08-13: qty 1

## 2014-08-13 MED ORDER — MIDAZOLAM HCL 2 MG/2ML IJ SOLN
INTRAMUSCULAR | Status: AC
  Filled 2014-08-13: qty 2

## 2014-08-13 MED ORDER — LIDOCAINE HCL (CARDIAC) 20 MG/ML IV SOLN
INTRAVENOUS | Status: AC
  Filled 2014-08-13: qty 10

## 2014-08-13 MED ORDER — SODIUM CHLORIDE 0.9 % IJ SOLN
INTRAMUSCULAR | Status: AC
  Filled 2014-08-13: qty 10

## 2014-08-13 MED ORDER — 0.9 % SODIUM CHLORIDE (POUR BTL) OPTIME
TOPICAL | Status: DC | PRN
  Administered 2014-08-13: 1000 mL

## 2014-08-13 MED ORDER — HEPARIN SODIUM (PORCINE) 1000 UNIT/ML IJ SOLN
INTRAMUSCULAR | Status: AC
  Filled 2014-08-13: qty 1

## 2014-08-13 MED ORDER — PENTAFLUOROPROP-TETRAFLUOROETH EX AERO: 1.0000 "application " | INHALATION_SPRAY | CUTANEOUS | Status: DC | PRN

## 2014-08-13 MED ORDER — HEPARIN SODIUM (PORCINE) 1000 UNIT/ML IJ SOLN
INTRAMUSCULAR | Status: DC | PRN
  Administered 2014-08-13: 7000 [IU] via INTRAVENOUS

## 2014-08-13 MED ORDER — FENTANYL CITRATE (PF) 250 MCG/5ML IJ SOLN
INTRAMUSCULAR | Status: AC
  Filled 2014-08-13: qty 5

## 2014-08-13 MED ORDER — ONDANSETRON HCL 4 MG/2ML IJ SOLN
INTRAMUSCULAR | Status: AC
  Filled 2014-08-13: qty 2

## 2014-08-13 MED ORDER — PROPOFOL 10 MG/ML IV BOLUS
INTRAVENOUS | Status: DC | PRN
  Administered 2014-08-13: 200 mg via INTRAVENOUS

## 2014-08-13 MED ORDER — THROMBIN 20000 UNITS EX SOLR
CUTANEOUS | Status: AC
  Filled 2014-08-13: qty 20000

## 2014-08-13 MED ORDER — SODIUM CHLORIDE 0.9 % IR SOLN
Status: DC | PRN
  Administered 2014-08-13: 13:00:00

## 2014-08-13 MED ORDER — HEPARIN SODIUM (PORCINE) 1000 UNIT/ML DIALYSIS
1000.0000 [IU] | INTRAMUSCULAR | Status: DC | PRN
  Filled 2014-08-13: qty 1

## 2014-08-13 MED ORDER — PROMETHAZINE HCL 25 MG/ML IJ SOLN: 6.2500 mg | INTRAMUSCULAR | Status: DC | PRN

## 2014-08-13 MED ORDER — PHENYLEPHRINE HCL 10 MG/ML IJ SOLN
INTRAMUSCULAR | Status: DC | PRN
  Administered 2014-08-13 (×4): 80 ug via INTRAVENOUS

## 2014-08-13 MED ORDER — PROTAMINE SULFATE 10 MG/ML IV SOLN
INTRAVENOUS | Status: DC | PRN
  Administered 2014-08-13 (×2): 30 mg via INTRAVENOUS
  Administered 2014-08-13: 10 mg via INTRAVENOUS

## 2014-08-13 MED ORDER — OXYCODONE-ACETAMINOPHEN 5-325 MG PO TABS
1.0000 | ORAL_TABLET | ORAL | Status: DC | PRN
  Administered 2014-08-13 – 2014-08-14 (×3): 1 via ORAL
  Filled 2014-08-13 (×2): qty 1

## 2014-08-13 MED ORDER — SUCCINYLCHOLINE CHLORIDE 20 MG/ML IJ SOLN
INTRAMUSCULAR | Status: AC
  Filled 2014-08-13: qty 1

## 2014-08-13 MED ORDER — MIDAZOLAM HCL 2 MG/2ML IJ SOLN
INTRAMUSCULAR | Status: AC
  Administered 2014-08-13: 0.5 mg via INTRAVENOUS
  Filled 2014-08-13: qty 2

## 2014-08-13 MED ORDER — ROCURONIUM BROMIDE 50 MG/5ML IV SOLN
INTRAVENOUS | Status: AC
  Filled 2014-08-13: qty 2

## 2014-08-13 MED ORDER — FENTANYL CITRATE (PF) 100 MCG/2ML IJ SOLN
INTRAMUSCULAR | Status: DC | PRN
  Administered 2014-08-13 (×2): 50 ug via INTRAVENOUS
  Administered 2014-08-13: 100 ug via INTRAVENOUS
  Administered 2014-08-13: 50 ug via INTRAVENOUS

## 2014-08-13 MED ORDER — MIDAZOLAM HCL 2 MG/2ML IJ SOLN
0.5000 mg | Freq: Once | INTRAMUSCULAR | Status: AC | PRN
  Administered 2014-08-13: 0.5 mg via INTRAVENOUS

## 2014-08-13 MED ORDER — ONDANSETRON HCL 4 MG/2ML IJ SOLN
INTRAMUSCULAR | Status: DC | PRN
  Administered 2014-08-13: 4 mg via INTRAVENOUS

## 2014-08-13 MED ORDER — SODIUM CHLORIDE 0.9 % IV SOLN: 100.0000 mL | INTRAVENOUS | Status: DC | PRN

## 2014-08-13 MED ORDER — MIDAZOLAM HCL 5 MG/5ML IJ SOLN
INTRAMUSCULAR | Status: DC | PRN
  Administered 2014-08-13: 2 mg via INTRAVENOUS

## 2014-08-13 MED ORDER — HYDROMORPHONE HCL 1 MG/ML IJ SOLN
INTRAMUSCULAR | Status: AC
  Administered 2014-08-13: 0.5 mg via INTRAVENOUS
  Filled 2014-08-13: qty 1

## 2014-08-13 MED ORDER — EPHEDRINE SULFATE 50 MG/ML IJ SOLN
INTRAMUSCULAR | Status: AC
  Filled 2014-08-13: qty 1

## 2014-08-13 MED ORDER — PROTAMINE SULFATE 10 MG/ML IV SOLN
INTRAVENOUS | Status: AC
  Filled 2014-08-13: qty 5

## 2014-08-13 MED ORDER — LIDOCAINE-PRILOCAINE 2.5-2.5 % EX CREA
1.0000 "application " | TOPICAL_CREAM | CUTANEOUS | Status: DC | PRN
  Filled 2014-08-13: qty 5

## 2014-08-13 MED ORDER — CEFAZOLIN SODIUM-DEXTROSE 2-3 GM-% IV SOLR
INTRAVENOUS | Status: AC
  Filled 2014-08-13: qty 50

## 2014-08-13 MED ORDER — SODIUM CHLORIDE 0.9 % IV SOLN
INTRAVENOUS | Status: DC
Start: 2014-08-13 — End: 2014-08-23
  Administered 2014-08-13 – 2014-08-22 (×2): via INTRAVENOUS
  Administered 2014-08-22: 10 mL/h via INTRAVENOUS

## 2014-08-13 MED ORDER — HEPARIN SODIUM (PORCINE) 1000 UNIT/ML DIALYSIS
4000.0000 [IU] | INTRAMUSCULAR | Status: DC | PRN
  Filled 2014-08-13: qty 4

## 2014-08-13 MED ORDER — ALTEPLASE 2 MG IJ SOLR
2.0000 mg | Freq: Once | INTRAMUSCULAR | Status: AC | PRN
  Filled 2014-08-13: qty 2

## 2014-08-13 MED ORDER — NEPRO/CARBSTEADY PO LIQD
237.0000 mL | ORAL | Status: DC | PRN
  Filled 2014-08-13: qty 237

## 2014-08-13 MED ORDER — NEOSTIGMINE METHYLSULFATE 10 MG/10ML IV SOLN
INTRAVENOUS | Status: AC
Start: 2014-08-13 — End: 2014-08-13
  Filled 2014-08-13: qty 4

## 2014-08-13 MED ORDER — OXYCODONE-ACETAMINOPHEN 5-325 MG PO TABS
ORAL_TABLET | ORAL | Status: AC
  Administered 2014-08-13: 1 via ORAL
  Filled 2014-08-13: qty 1

## 2014-08-13 SURGICAL SUPPLY — 31 items
CANISTER SUCTION 2500CC (MISCELLANEOUS) ×2 IMPLANT
CANNULA VESSEL 3MM 2 BLNT TIP (CANNULA) ×2 IMPLANT
CLIP TI MEDIUM 24 (CLIP) ×2 IMPLANT
CLIP TI WIDE RED SMALL 24 (CLIP) ×2 IMPLANT
DRAIN SNY WOU (WOUND CARE) IMPLANT
ELECT REM PT RETURN 9FT ADLT (ELECTROSURGICAL) ×2
ELECTRODE REM PT RTRN 9FT ADLT (ELECTROSURGICAL) ×1 IMPLANT
EVACUATOR SILICONE 100CC (DRAIN) IMPLANT
GLOVE BIO SURGEON STRL SZ7.5 (GLOVE) ×2 IMPLANT
GOWN STRL REUS W/ TWL LRG LVL3 (GOWN DISPOSABLE) ×3 IMPLANT
GOWN STRL REUS W/TWL LRG LVL3 (GOWN DISPOSABLE) ×3
GRAFT GORETEX STND 6X20 (Vascular Products) ×2 IMPLANT
GRAFT GORETEXSTD 6X20 (Vascular Products) ×1 IMPLANT
KIT BASIN OR (CUSTOM PROCEDURE TRAY) ×2 IMPLANT
KIT ROOM TURNOVER OR (KITS) ×2 IMPLANT
LIQUID BAND (GAUZE/BANDAGES/DRESSINGS) ×2 IMPLANT
NS IRRIG 1000ML POUR BTL (IV SOLUTION) ×4 IMPLANT
PACK PERIPHERAL VASCULAR (CUSTOM PROCEDURE TRAY) ×2 IMPLANT
PAD ARMBOARD 7.5X6 YLW CONV (MISCELLANEOUS) ×4 IMPLANT
SPONGE SURGIFOAM ABS GEL 100 (HEMOSTASIS) IMPLANT
STAPLER VISISTAT 35W (STAPLE) IMPLANT
SUT ETHILON 3 0 PS 1 (SUTURE) IMPLANT
SUT PROLENE 5 0 C 1 24 (SUTURE) ×2 IMPLANT
SUT PROLENE 6 0 CC (SUTURE) ×6 IMPLANT
SUT VIC AB 2-0 CTX 36 (SUTURE) ×2 IMPLANT
SUT VIC AB 3-0 SH 27 (SUTURE) ×2
SUT VIC AB 3-0 SH 27X BRD (SUTURE) ×2 IMPLANT
SUT VICRYL 4-0 PS2 18IN ABS (SUTURE) ×4 IMPLANT
TRAY FOLEY CATH 16FRSI W/METER (SET/KITS/TRAYS/PACK) ×2 IMPLANT
UNDERPAD 30X30 INCONTINENT (UNDERPADS AND DIAPERS) ×2 IMPLANT
WATER STERILE IRR 1000ML POUR (IV SOLUTION) ×2 IMPLANT

## 2014-08-13 NOTE — Anesthesia Preprocedure Evaluation (Addendum)
Anesthesia Evaluation  Patient identified by MRN, date of birth, ID band Patient awake    Reviewed: Allergy & Precautions, NPO status , reviewed documented beta blocker date and time   History of Anesthesia Complications Negative for: history of anesthetic complications  Airway Mallampati: II  TM Distance: >3 FB Neck ROM: Full    Dental  (+) Edentulous Lower, Edentulous Upper   Pulmonary Current Smoker,  breath sounds clear to auscultation        Cardiovascular hypertension (controlled with dialysis), + CAD, + Past MI, + Cardiac Stents and + Peripheral Vascular Disease Rhythm:Regular Rate:Normal  '15 EF 55% 4/16 stress myoview: No reversible ischemia or infarction, Hypokinesia of the inferior septal wall, Left ventricular ejection fraction 52%    Neuro/Psych Anxiety Chronic pain: narcotics Bell's palsy    GI/Hepatic GERD-  Medicated and Controlled,(+) Hepatitis -, BMarkedly elevated LFTs   Endo/Other  diabetes (glu 82, controlled with dialysis)  Renal/GU ESRF and DialysisRenal disease (K+ 4.9)     Musculoskeletal   Abdominal (+) + obese,   Peds  Hematology  (+) Blood dyscrasia (effient, Hb 10.5), ,   Anesthesia Other Findings   Reproductive/Obstetrics                           Anesthesia Physical Anesthesia Plan  ASA: III  Anesthesia Plan: General   Post-op Pain Management:    Induction: Intravenous  Airway Management Planned: LMA  Additional Equipment:   Intra-op Plan:   Post-operative Plan:   Informed Consent: I have reviewed the patients History and Physical, chart, labs and discussed the procedure including the risks, benefits and alternatives for the proposed anesthesia with the patient or authorized representative who has indicated his/her understanding and acceptance.     Plan Discussed with: CRNA and Surgeon  Anesthesia Plan Comments: (Plan routine monitors, GA-  LMA OK)        Anesthesia Quick Evaluation

## 2014-08-13 NOTE — Progress Notes (Signed)
Patient history noted.  Elevated LFTs highly likely from rhabdomyolysis, and does not need any more specific liver evaluation at the present time.  Either [facility] or [facility] can follow-up with patient for management of possible chronic hepatitis C as outpatient, pending resolution of his more pressing acute medical matters; call our office at [phone] upon hospital discharge if follow-up with [place] for his possible chronic Hepatitis C is desired.  Will sign-off; thank you for the consultation.

## 2014-08-13 NOTE — H&P (View-Only) (Signed)
    Subjective  -   1 episode of bleeding yesterday after I saw him.  Otherwise he feels well without complaints   Physical Exam:  Dressing in place and left thigh. Palpable thrill within dialysis graft Respirations nonlabored       Assessment/Plan:    Infected ulcer on the medial side of left thigh dialysis graft.  I had initially posted the patient for revision today, however I found out that he was taking Effient.  Therefore I discussed this with cardiology and the Effient was discontinued.  From the records, it does not look like it was started when he was admitted the hospital.  I will post him tomorrow for revision of his left thigh graft.  He'll need to be nothing by mouth after midnight.  BRABHAM IV, V. WELLS 08/12/2014 9:13 AM --  Filed Vitals:   08/12/14 0753  BP: 114/50  Pulse: 79  Temp: 97.9 F (36.6 C)  Resp: 18    Intake/Output Summary (Last 24 hours) at 08/12/14 0913 Last data filed at 08/12/14 0828  Gross per 24 hour  Intake    270 ml  Output      0 ml  Net    270 ml     Laboratory CBC    Component Value Date/Time   WBC 20.6* 08/12/2014 0400   HGB 10.5* 08/12/2014 0400   HCT 33.5* 08/12/2014 0400   PLT 302 08/12/2014 0400    BMET    Component Value Date/Time   NA 134* 08/12/2014 0400   K 4.4 08/12/2014 0400   CL 93* 08/12/2014 0400   CO2 25 08/12/2014 0400   GLUCOSE 81 08/12/2014 0400   BUN 46* 08/12/2014 0400   CREATININE 7.66* 08/12/2014 0400   CALCIUM 8.4 08/12/2014 0400   GFRNONAA 7* 08/12/2014 0400   GFRAA 8* 08/12/2014 0400    COAG Lab Results  Component Value Date   INR 1.09 08/11/2014   INR 1.10 08/10/2014   INR 1.11 08/10/2014   No results found for: PTT  Antibiotics Anti-infectives    Start     Dose/Rate Route Frequency Ordered Stop   08/11/14 2000  vancomycin (VANCOCIN) 2,000 mg in sodium chloride 0.9 % 500 mL IVPB     2,000 mg 250 mL/hr over 120 Minutes Intravenous  Once 08/11/14 1803 08/11/14 2343   08/10/14 1700  piperacillin-tazobactam (ZOSYN) IVPB 2.25 g     2.25 g 100 mL/hr over 30 Minutes Intravenous 3 times per day 08/10/14 1646         V. Wells Brabham IV, M.D. [facility] Office: [phone] Pager:  [phone]  

## 2014-08-13 NOTE — Progress Notes (Signed)
Called for pt for hemodialysis. Nurse reports that the pt is refusing to come tonight and states that he will come in the morning. States he is tired, hungry and not going to go tonight. Dr. Eliott Nine called and made aware of this. Pts Tx move to the morning.

## 2014-08-13 NOTE — Progress Notes (Signed)
  [facility] Progress Note   Subjective: "tired", no other probs  Filed Vitals:   08/12/14 2200 08/12/14 2330 08/13/14 0400 08/13/14 0734  BP: 122/62 100/81 130/49 142/46  Pulse: 79 75 80 73  Temp: 97.7 F (36.5 C) 97.9 F (36.6 C) 98 F (36.7 C) 98.5 F (36.9 C)  TempSrc: Oral Oral Oral Oral  Resp: 18 18 18 18   Height:      Weight:   114.7 kg (252 lb 13.9 oz)   SpO2: 98% 99% 96% 99%   Exam: Alert, chronically ill No jvd Chest clear bilat RRR no RG Abd soft, obese, NTND L thigh AVG +two non-draining ulcerated areas medial aspect, +bruit No LE edema Neuro is nf, Ox 3  HD: AF MTWFri 3.5h  2/3.5 Bath  116kg  Heparin 11,000   L thigh AVG Venofer 50/wk, no ESA        Assessment: 1. Septic shock - resolved 2. L thigh AVG infection / ulceration - s/p Vanc IV x 1, ?pharm managing. No +cultures yet 3. Rhabdomyolysis  4. ^LFT's  5. 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 CAD hx stent 7. ESRD on HD 8. Anemia on IV Fe, Hb 10 9. MBD cont meds   Plan - HD today, Abx    Vinson Moselle MD  pager [phone]    cell [phone]  08/13/2014, 9:20 AM     Recent Labs Lab 08/11/14 0710 08/12/14 0400 08/13/14 0513  NA 136 134* 135  K 4.7 4.4 4.9  CL 94* 93* 94*  CO2 24 25 22   GLUCOSE 68* 81 79  BUN 33* 46* 56*  CREATININE 6.25* 7.66* 9.04*  CALCIUM 8.8 8.4 8.1*    Recent Labs Lab 08/11/14 0710 08/12/14 0400 08/13/14 0513  AST 1218* 1533* 1608*  ALT 234* 283* 322*  ALKPHOS 213* 203* 195*  BILITOT 2.1* 1.8* 1.4*  PROT 7.2 7.4 7.2  ALBUMIN 2.2* 2.1* 2.1*    Recent Labs Lab 08/10/14 1248 08/11/14 0710 08/12/14 0400 08/13/14 0513  WBC 14.8* 22.3* 20.6* 16.5*  NEUTROABS 13.5*  --   --   --   HGB 12.1* 11.1* 10.5* 10.5*  HCT 37.7* 33.7* 33.5* 33.3*  MCV 95.7 96.0 96.5 96.2  PLT 253 268 302 272   . antiseptic oral rinse  7 mL Mouth Rinse BID  . aspirin EC  325 mg Oral Daily  . Chlorhexidine Gluconate Cloth  6 each Topical Q0600   . folic acid  1 mg Intravenous Daily  . heparin  5,000 Units Subcutaneous 3 times per day  . insulin aspart  0-9 Units Subcutaneous TID WC  . latanoprost  1 drop Both Eyes QHS  . sodium bicarbonate  650 mg Oral BID  . thiamine  100 mg Intravenous Daily     fentaNYL (SUBLIMAZE) injection, ondansetron **OR** ondansetron (ZOFRAN) IV

## 2014-08-13 NOTE — Progress Notes (Signed)
INITIAL NUTRITION ASSESSMENT  DOCUMENTATION CODES Per approved criteria  -Obesity Unspecified   INTERVENTION:  When diet advanced, add Nepro Shake PO BID to maximize protein intake, each supplement provides 425 kcal and 19 grams protein.  Recommend renal multivitamin (Nephrovite) daily.  NUTRITION DIAGNOSIS: Increased nutrient needs related to ESRD on HD and wounds as evidenced by estimated needs.   Goal: Intake to meet >90% of estimated nutrition needs.  Monitor:  PO intake, labs, weight trend.  Reason for Assessment: MD Consult for wound healing.  59 y.o. male  Admitting Dx: Sepsis associated hypotension  ASSESSMENT: Patient admitted on 4/15 with sepsis related to suspected infected left thigh HD graft.   Patient with diabetic left foot ulcer and infected thigh graft with pyomyositis. Needs increased protein to support wound healing. Patient just returned from revision of left thigh graft this AM. He reports that he has lost weight, he weighed 123 kg last Friday.   No muscle or subcutaneous fat depletion noticed. Patient still NPO from procedures this AM. He was lethargic during RD visit, unable to provide much nutrition hx. He has been eating poorly since admission, consuming 50% of meals and snacks.   Height: Ht Readings from Last 1 Encounters:  08/10/14 5\' 11"  (1.803 m)    Weight: Wt Readings from Last 1 Encounters:  08/13/14 252 lb 13.9 oz (114.7 kg)    Ideal Body Weight: 78.2 kg  % Ideal Body Weight: 147%  Wt Readings from Last 10 Encounters:  08/13/14 252 lb 13.9 oz (114.7 kg)  07/19/14 260 lb 2.3 oz (118 kg)  06/27/14 262 lb 5.6 oz (119 kg)  01/25/14 261 lb 11.2 oz (118.706 kg)  08/07/13 277 lb 5.4 oz (125.8 kg)  06/08/13 283 lb 14.4 oz (128.776 kg)  05/24/13 279 lb 5.2 oz (126.7 kg)  03/01/13 278 lb (126.1 kg)  02/15/13 278 lb (126.1 kg)  02/14/13 280 lb 4.8 oz (127.143 kg)    Usual Body Weight: 123 kg per patient (270 lbs) last Friday  %  Usual Body Weight: 93%  BMI:  Body mass index is 35.28 kg/(m^2).  Estimated Nutritional Needs: Kcal: 2400-2600 Protein: 110-130 gm Fluid: 1.2 L  Skin: left thigh infected graft site; left foot diabetic ulcer  Diet Order: Diet NPO time specified Except for: Sips with Meds  EDUCATION NEEDS: -Education not appropriate at this time   Intake/Output Summary (Last 24 hours) at 08/13/14 1350 Last data filed at 08/13/14 0513  Gross per 24 hour  Intake      0 ml  Output    300 ml  Net   -300 ml    Last BM: 4/18   Labs:   Recent Labs Lab 08/11/14 0710 08/12/14 0400 08/13/14 0513  NA 136 134* 135  K 4.7 4.4 4.9  CL 94* 93* 94*  CO2 24 25 22   BUN 33* 46* 56*  CREATININE 6.25* 7.66* 9.04*  CALCIUM 8.8 8.4 8.1*  GLUCOSE 68* 81 79    CBG (last 3)   Recent Labs  08/12/14 1616 08/12/14 2202 08/13/14 0733  GLUCAP 107* 81 82    Scheduled Meds: . [MAR Hold] antiseptic oral rinse  7 mL Mouth Rinse BID  . [MAR Hold] aspirin EC  325 mg Oral Daily  . [MAR Hold] Chlorhexidine Gluconate Cloth  6 each Topical Q0600  . [MAR Hold] folic acid  1 mg Intravenous Daily  . [MAR Hold] heparin  5,000 Units Subcutaneous 3 times per day  . [MAR Hold] insulin aspart  0-9 Units Subcutaneous TID WC  . [MAR Hold] latanoprost  1 drop Both Eyes QHS  . [MAR Hold] sodium bicarbonate  650 mg Oral BID  . [MAR Hold] thiamine  100 mg Intravenous Daily    Continuous Infusions: . sodium chloride 10 mL/hr at 08/13/14 1033    Past Medical History  Diagnosis Date  . Anxiety   . Back pain   . GERD (gastroesophageal reflux disease)   . Peripheral neuropathy   . Arthritis   . Chronic diastolic CHF (congestive heart failure)     a. 04/2013 Echo: EF nl, no rwma, mild LVH.  Marland Kitchen Active smoker   . ESRD on hemodialysis     [name] 4 days per week on M-Tu-Wed and Fri.  Started HD in 1998 and has been on HD initially at [facility], then went to [facility], then in [facility], then to [facility] and now  is at [facility] for last 10 years.  Has L thigh AVG.     . Diabetes mellitus     diet controlled  . 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 PONV (postoperative nausea and vomiting)   . Hypertension   . Bell palsy   . Carpal tunnel syndrome     bilateral  . Palpitations   . Headache(784.0)     Hx: of Migraines  . CAD (coronary artery disease)     a. 04/2013 Ant STEMI/PCI: LM nl, LAD 95p (3.0x18 Xience DES), D1/2/3 small, LCX 80ost, RI 20p, RCA 123m CTO (unsuccessful PCI), EF 55%, mod basal inf HK.  . S/P coronary artery stent placement 05/21/2013    Xience Alpine Medtronic conditional 5 @ 1.5 and 3T     Past Surgical History  Procedure Laterality Date  . Total hip arthroplasty    . Thyroidectomy    . Tooth extraction    . Mandible fracture surgery    . Dg av dialysis graft declot or    . Insertion of dialysis catheter  03/28/2011    Procedure: INSERTION OF DIALYSIS CATHETER;  Surgeon: Pryor Ochoa, MD;  Location: [facility] OR;  Service: Vascular;  Laterality: Left;  . Av fistula placement  03/31/2011    Procedure: INSERTION OF ARTERIOVENOUS (AV) GORE-TEX GRAFT THIGH;  Surgeon: Sherren Kerns, MD;  Location: [facility] OR;  Service: Vascular;  Laterality: Right;  redo right thigh arteriovenous gortex graft using gore-tex stretch 6mm x 70cm  . Thrombectomy w/ embolectomy  06/02/2011    Procedure: THROMBECTOMY ARTERIOVENOUS GORE-TEX GRAFT;  Surgeon: Chuck Hint, MD;  Location: [facility] OR;  Service: Vascular;  Laterality: Right;  Thrombectomy right thigh arteriovenous gortex graft;  revision by  replacement of large portion of graft with 7mm gore-tex   . Insertion of dialysis catheter  08/28/2011    Procedure: INSERTION OF DIALYSIS CATHETER;  Surgeon: Fransisco Hertz, MD;  Location: [facility] OR;  Service: Vascular;  Laterality: Left;  Atempted Bilateral Internal Jugular, Bilateral Subclavin insertion of 55cm Dialysis Catheter Left Femoral  . Insertion of dialysis catheter  12/15/2011     Procedure: INSERTION OF DIALYSIS CATHETER;  Surgeon: Sherren Kerns, MD;  Location: [facility] OR;  Service: Vascular;  Laterality: Right;  Insertion of Right Femoral Dialysis Catheter  . Exchange of a dialysis catheter  02/26/2012    Procedure: EXCHANGE OF A DIALYSIS CATHETER;  Surgeon: Larina Earthly, MD;  Location: [facility] OR;  Service: Vascular;  Laterality: Right;  . Joint  replacement    . Exchange of a dialysis catheter  05/18/2012    Procedure: EXCHANGE OF A DIALYSIS CATHETER;  Surgeon: Larina Earthly, MD;  Location: [facility] OR;  Service: Vascular;  Laterality: Right;  right femoral dialysis catheter  . Av fistula placement  05/18/2012    Procedure: INSERTION OF ARTERIOVENOUS (AV) GORE-TEX GRAFT THIGH;  Surgeon: Larina Earthly, MD;  Location: [facility] OR;  Service: Vascular;  Laterality: Left;  using 6mm by 70cm goretex graft  . Thrombectomy w/ embolectomy Left 08/25/2012    Procedure: THROMBECTOMY ARTERIOVENOUS GORE-TEX GRAFT;  Surgeon: Pryor Ochoa, MD;  Location: [facility] OR;  Service: Vascular;  Laterality: Left;  Attempted thrombectomy of left thigh arteriovenous gortex graft.   . Av fistula placement Left 08/25/2012    Procedure: INSERTION OF ARTERIOVENOUS (AV) GORE-TEX GRAFT THIGH;  Surgeon: Pryor Ochoa, MD;  Location: [facility] OR;  Service: Vascular;  Laterality: Left;  Using 6mm x 40cm vascular Gortex graft.  . Revision of arteriovenous goretex graft Left 12/14/2012    Procedure: Revision of Left Thigh Graft;  Surgeon: Larina Earthly, MD;  Location: [facility] OR;  Service: Vascular;  Laterality: Left;  . Avgg removal Left 02/16/2013    Procedure: EXCISION OF LEFT ARM ARTERIOVENOUS GORETEX GRAFT TIMES 2 WITH VEIN PATCH ANGIOPLASTY OF BRACIAL ARTERY.  ;  Surgeon: Chuck Hint, MD;  Location: [facility] OR;  Service: Vascular;  Laterality: Left;  Converted from MAC to General.    . Revision of arteriovenous goretex graft Left 08/08/2013    Procedure: REVISION OF LEFT THIGH ARTERIOVENOUS GORETEX GRAFT;  Surgeon: Sherren Kerns, MD;   Location: [facility] OR;  Service: Vascular;  Laterality: Left;  . Venogram Bilateral 10/27/2011    Procedure: VENOGRAM;  Surgeon: Nada Libman, MD;  Location: [facility] CATH LAB;  Service: Cardiovascular;  Laterality: Bilateral;  bilat upper extrem venograms  . Left heart cath Bilateral 05/21/2013    Procedure: LEFT HEART CATH;  Surgeon: Iran Ouch, MD;  Location: [facility] CATH LAB;  Service: Cardiovascular;  Laterality: Bilateral;  . Percutaneous coronary stent intervention (pci-s)  05/21/2013    Procedure: PERCUTANEOUS CORONARY STENT INTERVENTION (PCI-S);  Surgeon: Iran Ouch, MD;  Location: [facility] CATH LAB;  Service: Cardiovascular;;  . Coronary angioplasty with stent placement    . Radiology with anesthesia N/A 06/27/2014    Procedure: MRI LUMBER WITHOUT CONTRAST;  Surgeon: Medication Radiologist, MD;  Location: [facility] OR;  Service: Radiology;  Laterality: N/A;    Joaquin Courts, RD, LDN, CNSC Pager [phone] After Hours Pager [phone]

## 2014-08-13 NOTE — Progress Notes (Signed)
[facility] PROGRESS NOTE  TULLIO CHAUSSE [name] DOB: 28-Feb-1956 DOA: 08/10/2014 PCP: Dyke Maes, MD Interim summary: 23- year ol  Male with multiple medical  Problems including chronic diastolic heart failure, ESRD on HD 4 times a week, diet controlled DM, h/o hepatitis C, hypertension,  CAD s/p PCI, admitted for chest pain, back pain.  On admission he was found to be hypotensive and underwent HD on Friday 4 /14.  Assessment/Plan:  Hypotension: resolved.    Elevated liver function tests, and elevated CK levels: Unclear etiology. His hepatitis C antibody is positive. Differential include shock liver, vs  Acute hepatitis vs rhabdomyolysis vs sepsis, source probably from the left thigh graft. US Abdomen unremarkable. He currently denies nausea or vomiting/ abdominal pain. Ct abdomen ad pelvis ordered for further evaluation, does not show any liver cirrhosis, but several granulomas in the liver, gall stones and bilateral renal cysts. Gastroenterology Dr Randa Evens and ID consult requested and recommendations given.   Leukocytosis: improved . Elevated pro calcitonin , normal lactic acid level. Probably from sepsis. Zosyn discontinued by ID, added vancomycin empirically.   Chest pain / abnormal EKG/ ELEVATED troponin's CAD s/p PCI: chest pain resolved. Underwent stress myoview. Results are unremarkable. Cardiology signed off. Off Effient for the vascular procedure on 4/18.    Sepsis/ Bleeding from the left thigh HD graft with some ulceration medially: Could be the source of infection, vancomycin added to the regimen. His leukocytosis improving, reports no pain in the left leg this am. His sepsis seems to be improving.  Vascular consulted and recommendations given. Underwent revision of the left thigh AV graft.   ESRD ON  HD: Further management as per renal. HD 4 times a week. Renal on board.    Anemia: Mild and normocytic.    Hyperkalemia: resolved on repeat labs today.    Diabetes Mellitus: CBG (last 3)   Recent Labs  08/12/14 2202 08/13/14 0733 08/13/14 1438  GLUCAP 81 82 84   hgba1c ordered and  Diet controlled DM.   Hypertension: controlled.    Chronic diastolic heart failure: He appears to be compensated.       Code Status: full code Family Communication: NONE at bedside Disposition Plan: pending further investigation..    Consultants:  Renal  Vascular  Cardiology   Procedures:  Stress myoview  HD    Antibiotics:  Vancomycin   zosyn  HPI/Subjective: No more bleeding from the thigh graft.   Objective: Filed Vitals:   08/13/14 1501  BP: 121/49  Pulse: 93  Temp: 98.5 F (36.9 C)  Resp: 16    Intake/Output Summary (Last 24 hours) at 08/13/14 1601 Last data filed at 08/13/14 1350  Gross per 24 hour  Intake    400 ml  Output    300 ml  Net    100 ml   Filed Weights   08/11/14 0418 08/12/14 0400 08/13/14 0400  Weight: 117.164 kg (258 lb 4.8 oz) 114.941 kg (253 lb 6.4 oz) 114.7 kg (252 lb 13.9 oz)    Exam:   General:  Alert not indistress  Cardiovascular: s1s2  Respiratory: diminished air entry at bases  Abdomen: soft non tender non distended bowel sounds heard  Musculoskeletal: pedal edema present  Data Reviewed: Basic Metabolic Panel:  Recent Labs Lab 08/10/14 1248 08/11/14 0710 08/12/14 0400 08/13/14 0513  NA 135 136 134* 135  K 5.2* 4.7 4.4 4.9  CL 95* 94* 93* 94*  CO2 GLUCOSE 90 68* 81 79  BUN 23 33* 46* 56*  CREATININE 4.60* 6.25* 7.66* 9.04*  CALCIUM 9.7 8.8 8.4 8.1*   Liver Function Tests:  Recent Labs Lab 08/10/14 1248 08/11/14 0710 08/12/14 0400 08/13/14 0513  AST 1223* 1218* 1533* 1608*  ALT 235* 234* 283* 322*  ALKPHOS 248* 213* 203* 195*  BILITOT 2.7* 2.1* 1.8* 1.4*  PROT 7.9 7.2 7.4 7.2  ALBUMIN 2.5* 2.2* 2.1* 2.1*    Recent Labs Lab 08/10/14 1830  LIPASE 31   No results for input(s): AMMONIA in the last 168 hours. CBC:  Recent  Labs Lab 08/10/14 1248 08/11/14 0710 08/12/14 0400 08/13/14 0513  WBC 14.8* 22.3* 20.6* 16.5*  NEUTROABS 13.5*  --   --   --   HGB 12.1* 11.1* 10.5* 10.5*  HCT 37.7* 33.7* 33.5* 33.3*  MCV 95.7 96.0 96.5 96.2  PLT 253 268 302 272   Cardiac Enzymes:  Recent Labs Lab 08/10/14 1437 08/10/14 1830 08/11/14 1026 08/11/14 1542 08/11/14 2104  CKTOTAL  --  11720* 14273*  --   --   TROPONINI 0.12*  --  1.98* 2.11* 2.37*   BNP (last 3 results) No results for input(s): BNP in the last 8760 hours.  ProBNP (last 3 results) No results for input(s): PROBNP in the last 8760 hours.  CBG:  Recent Labs Lab 08/12/14 1444 08/12/14 1616 08/12/14 2202 08/13/14 0733 08/13/14 1438  GLUCAP 94 107* 81 82 84    Recent Results (from the past 240 hour(s))  MRSA PCR Screening     Status: None   Collection Time: 08/10/14  5:33 PM  Result Value Ref Range Status   MRSA by PCR NEGATIVE NEGATIVE Final    Comment:        The GeneXpert MRSA Assay (FDA approved for NASAL specimens only), is one component of a comprehensive MRSA colonization surveillance program. It is not intended to diagnose MRSA infection nor to guide or monitor treatment for MRSA infections.   Culture, blood (routine x 2)     Status: None (Preliminary result)   Collection Time: 08/10/14  6:08 PM  Result Value Ref Range Status   Specimen Description BLOOD LEFT ANTECUBITAL  Final   Special Requests   Final    BOTTLES DRAWN AEROBIC AND ANAEROBIC 10 CC BLUE 8CC RED   Culture   Final           BLOOD CULTURE RECEIVED NO GROWTH TO DATE CULTURE WILL BE HELD FOR 5 DAYS BEFORE ISSUING A FINAL NEGATIVE REPORT Performed at [facility]    Report Status PENDING  Incomplete  Culture, blood (routine x 2)     Status: None (Preliminary result)   Collection Time: 08/10/14  6:18 PM  Result Value Ref Range Status   Specimen Description BLOOD RIGHT HAND  Final   Special Requests BOTTLES DRAWN AEROBIC ONLY 8 CC  Final    Culture   Final           BLOOD CULTURE RECEIVED NO GROWTH TO DATE CULTURE WILL BE HELD FOR 5 DAYS BEFORE ISSUING A FINAL NEGATIVE REPORT Performed at [facility]    Report Status PENDING  Incomplete  Clostridium Difficile by PCR     Status: None   Collection Time: 08/13/14  1:35 AM  Result Value Ref Range Status   C difficile by pcr NEGATIVE NEGATIVE Final     Studies: Ct Abdomen Pelvis Wo Contrast  08/13/2014   CLINICAL DATA:  Abnormal liver function tests.  EXAM: CT ABDOMEN AND PELVIS  WITHOUT CONTRAST  TECHNIQUE: Multidetector CT imaging of the abdomen and pelvis was performed following the standard protocol without IV contrast.  COMPARISON:  06/03/2010  FINDINGS: There is several tiny calcified granulomas in the liver. Liver parenchyma otherwise appears normal. Multiple stones in the gallbladder. No dilated bile ducts.  Spleen, pancreas, and adrenal glands are normal. Severe bilateral renal atrophy with numerous cysts in both kidneys consistent with chronic renal failure.  The bowel is normal except for a few diverticula in the ascending colon. Extensive arterial vascular calcification. No free air free fluid or adenopathy. There is a 4 cm fluid collection in the left inguinal region adjacent to the dialysis graft. This probably represents a seroma. Severe degenerative disc disease in the lower lumbar spine with severe spinal stenosis at L3-4 with a broad-based disc protrusion asymmetric into the right neural foramen.  IMPRESSION: No acute abnormality. Granulomas in the liver. Cholelithiasis. Probable seroma in the left inguinal region.   Electronically Signed   By: Francene Boyers M.D.   On: 08/13/2014 07:33   Dg Foot Complete Left  08/12/2014   CLINICAL DATA:  Pt has a diabetic foot ulcer on the plantar surface of his 1st toe. Pt was unable to move foot or hold foot in position  EXAM: LEFT FOOT - COMPLETE 3+ VIEW  COMPARISON:  None.  FINDINGS: No fracture. Joints are normally aligned.  There are no areas of bone resorption to suggest osteomyelitis.  Vascular calcifications are noted. Soft tissues otherwise unremarkable.  IMPRESSION: 1. No fracture.  No evidence of osteomyelitis.   Electronically Signed   By: Amie Portland M.D.   On: 08/12/2014 14:44    Scheduled Meds: . antiseptic oral rinse  7 mL Mouth Rinse BID  . aspirin EC  325 mg Oral Daily  . Chlorhexidine Gluconate Cloth  6 each Topical Q0600  . folic acid  1 mg Intravenous Daily  . heparin  5,000 Units Subcutaneous 3 times per day  . insulin aspart  0-9 Units Subcutaneous TID WC  . latanoprost  1 drop Both Eyes QHS  . sodium bicarbonate  650 mg Oral BID  . thiamine  100 mg Intravenous Daily   Continuous Infusions: . sodium chloride 10 mL/hr at 08/13/14 1033    Principal Problem:   Sepsis associated hypotension Active Problems:   End-stage renal disease on hemodialysis   History of placement of stent in LAD coronary artery   HTN (hypertension)   Radicular leg pain   Right-sided low back pain with right-sided sciatica   Elevated troponin   Abnormal transaminases   Metabolic encephalopathy   Chest pain   Shock circulatory   Pain in the chest   Weakness   Abnormal EKG   Abnormal liver function test   Lumbago   Diabetic foot ulcer   Myositis   NSTEMI (non-ST elevated myocardial infarction)   Thigh pain   Infection and inflammatory reaction due to cardiac device, implant, and graft    Time spent: 25 minutes    Artemisa Sladek  [facility] Pager [phone] If 7PM-7AM, please contact night-coverage at [email or web address], password [facility] 08/13/2014, 4:01 PM  LOS: 3 days

## 2014-08-13 NOTE — Transfer of Care (Signed)
Immediate Anesthesia Transfer of Care Note  Patient: [name]  Procedure(s) Performed: Procedure(s): REVISION OF ARTERIOVENOUS GORETEX GRAFT LEFT THIGH (Left)  Patient Location: PACU  Anesthesia Type:General  Level of Consciousness: awake, alert  and oriented  Airway & Oxygen Therapy: Patient Spontanous Breathing and Patient connected to nasal cannula oxygen  Post-op Assessment: Report given to RN and Post -op Vital signs reviewed and stable  Post vital signs: Reviewed and stable  Last Vitals:  Filed Vitals:   08/13/14 0734  BP: 142/46  Pulse: 73  Temp: 36.9 C  Resp: 18    Complications: No apparent anesthesia complications

## 2014-08-13 NOTE — Op Note (Signed)
Procedure: Revision left thigh AV graft  Preoperative diagnosis: Bleeding/degeneration left thigh AV graft  Postoperative diagnosis: Same  Anesthesia: General  Asst. Lianne Cure PA-C  Operative findings: #1   6 mm PTFE interposition graft left thigh graft medial limb   Specimens: Culture thigh graft  Operative details: After obtaining informed consent, the patient was taken to the operating room. The patient was placed in supine position on the operating room table. After induction of general anesthesia and placement of endotracheal tube the patient's entire left lower extremity was prepped and draped in a sterile fashion to the knee. Next a transverse incision was made just above and below an area of graft degeneration that had recently been bleeding.  This was approximately the 6 oclock and 10 oclock location. Each incision was carried down through the subcutaneous tissues down to level. The graft was dissected free circumferentially. It was well incorporated with no surrounding fluid or purulent material. A swab of the graft was sent for culture. The patient was given 7,000 units of intravenous heparin. The graft was clamped in the proximal incision as well as the distal incision with a fistula clamp. The graft was then divided in the proximal incision. There was some intimal hyperplasia  and degeneration of the graft but there was good arterial inflow. A new 6 mm graft was tunneled subcutaneously lateral to the old graft. This was then sewn end-to-end to the proximal graft using running 6-0 Prolene suture. The interposition graft was cut to length and sewn end-to-end to the venous limb of the graft again with a running 6-0 Prolene suture. Just prior to completion of the anastomosis everything was forebled backbled and thoroughly flushed. Anastomosis was secured, clamps released, and there was flow in the graft immediately. Hemostasis was obtained with thrombin Gelfoam and the assistance of 70  mg of protamine. Subcutaneous tissues of both incisions reapproximated with a running 3-0 Vicryl suture. Skin of both incisions was closed with 4 0 Vicryl subcuticular stitch. Dermabond was applied incisions. Patient tolerated the procedure well and there were no complications. Instrument, sponge and needle counts were correct at the end of the case. The patient was taken to the recovery room in stable condition.  Fabienne Bruns, MD [facility] [place] Office: [phone] Pager: [phone]

## 2014-08-13 NOTE — Interval H&P Note (Signed)
History and Physical Interval Note:  08/13/2014 11:42 AM  [name]  has presented today for surgery, with the diagnosis of end stage renal disease.  The various methods of treatment have been discussed with the patient and family. After consideration of risks, benefits and other options for treatment, the patient has consented to  Procedure(s): REVISION OF ARTERIOVENOUS GORETEX GRAFT LEFT THIGH (Left) possible removal as a surgical intervention .  The patient's history has been reviewed, patient examined, no change in status, stable for surgery.  I have reviewed the patient's chart and labs.  Questions were answered to the patient's satisfaction.     FIELDS,CHARLES E

## 2014-08-13 NOTE — Anesthesia Postprocedure Evaluation (Signed)
  Anesthesia Post-op Note  Patient: [name]  Procedure(s) Performed: Procedure(s): REVISION OF ARTERIOVENOUS GORETEX GRAFT LEFT THIGH (Left)  Patient Location: PACU  Anesthesia Type:General  Level of Consciousness: awake, alert , oriented and patient cooperative  Airway and Oxygen Therapy: Patient Spontanous Breathing and Patient connected to nasal cannula oxygen  Post-op Pain: mild  Post-op Assessment: Post-op Vital signs reviewed, Patient's Cardiovascular Status Stable, Respiratory Function Stable, Patent Airway, No signs of Nausea or vomiting and Pain level controlled  Post-op Vital Signs: Reviewed and stable  Last Vitals:  Filed Vitals:   08/13/14 1501  BP: 121/49  Pulse: 93  Temp: 36.9 C  Resp: 16    Complications: No apparent anesthesia complications

## 2014-08-14 DIAGNOSIS — B182 Chronic viral hepatitis C: Secondary | ICD-10-CM

## 2014-08-14 DIAGNOSIS — M549 Dorsalgia, unspecified: Secondary | ICD-10-CM | POA: Insufficient documentation

## 2014-08-14 DIAGNOSIS — M545 Low back pain: Secondary | ICD-10-CM

## 2014-08-14 LAB — RENAL FUNCTION PANEL
Albumin: 2.3 g/dL — ABNORMAL LOW (ref 3.5–5.2)
Anion gap: 13 (ref 5–15)
BUN: 31 mg/dL — ABNORMAL HIGH (ref 6–23)
CALCIUM: 9.2 mg/dL (ref 8.4–10.5)
CO2: 25 mmol/L (ref 19–32)
CREATININE: 6 mg/dL — AB (ref 0.50–1.35)
Chloride: 99 mmol/L (ref 96–112)
GFR calc non Af Amer: 9 mL/min — ABNORMAL LOW (ref >90)
GFR, EST AFRICAN AMERICAN: 11 mL/min — AB (ref >90)
Glucose, Bld: 82 mg/dL (ref 70–99)
Phosphorus: 4.5 mg/dL (ref 2.3–4.6)
Potassium: 4.4 mmol/L (ref 3.5–5.1)
Sodium: 137 mmol/L (ref 135–145)

## 2014-08-14 LAB — HCV RNA QUANT
HCV Quantitative Log: 5.9 {Log} — ABNORMAL HIGH (ref <1.18)
HCV Quantitative: 799973 IU/mL — ABNORMAL HIGH (ref <15)

## 2014-08-14 LAB — CBC
HCT: 33 % — ABNORMAL LOW (ref 39.0–52.0)
HEMATOCRIT: 36.8 % — AB (ref 39.0–52.0)
HEMOGLOBIN: 10.6 g/dL — AB (ref 13.0–17.0)
HEMOGLOBIN: 12.2 g/dL — AB (ref 13.0–17.0)
MCH: 30.7 pg (ref 26.0–34.0)
MCH: 31.4 pg (ref 26.0–34.0)
MCHC: 32.1 g/dL (ref 30.0–36.0)
MCHC: 33.2 g/dL (ref 30.0–36.0)
MCV: 95.7 fL (ref 78.0–100.0)
MCV: 94.8 fL (ref 78.0–100.0)
Platelets: 288 10*3/uL (ref 150–400)
Platelets: 271 10*3/uL (ref 150–400)
RBC: 3.45 MIL/uL — AB (ref 4.22–5.81)
RBC: 3.88 MIL/uL — ABNORMAL LOW (ref 4.22–5.81)
RDW: 17.5 % — ABNORMAL HIGH (ref 11.5–15.5)
RDW: 17.6 % — ABNORMAL HIGH (ref 11.5–15.5)
WBC: 14.1 10*3/uL — ABNORMAL HIGH (ref 4.0–10.5)
WBC: 13.7 10*3/uL — ABNORMAL HIGH (ref 4.0–10.5)

## 2014-08-14 LAB — CK: Total CK: 16194 U/L — ABNORMAL HIGH (ref 7–232)

## 2014-08-14 LAB — COMPREHENSIVE METABOLIC PANEL
ALK PHOS: 219 U/L — AB (ref 39–117)
ALT: 318 U/L — AB (ref 0–53)
AST: 1774 U/L — AB (ref 0–37)
Albumin: 2.1 g/dL — ABNORMAL LOW (ref 3.5–5.2)
Anion gap: 17 — ABNORMAL HIGH (ref 5–15)
BUN: 67 mg/dL — ABNORMAL HIGH (ref 6–23)
CALCIUM: 7.6 mg/dL — AB (ref 8.4–10.5)
CO2: 23 mmol/L (ref 19–32)
Chloride: 94 mmol/L — ABNORMAL LOW (ref 96–112)
Creatinine, Ser: 10.61 mg/dL — ABNORMAL HIGH (ref 0.50–1.35)
GFR calc non Af Amer: 5 mL/min — ABNORMAL LOW (ref >90)
GFR, EST AFRICAN AMERICAN: 5 mL/min — AB (ref >90)
GLUCOSE: 101 mg/dL — AB (ref 70–99)
POTASSIUM: 5 mmol/L (ref 3.5–5.1)
SODIUM: 134 mmol/L — AB (ref 135–145)
TOTAL PROTEIN: 6.8 g/dL (ref 6.0–8.3)
Total Bilirubin: 1.1 mg/dL (ref 0.3–1.2)

## 2014-08-14 LAB — HEMOGLOBIN A1C
Hgb A1c MFr Bld: 5.4 % (ref 4.8–5.6)
Mean Plasma Glucose: 108 mg/dL

## 2014-08-14 LAB — GLUCOSE, CAPILLARY
GLUCOSE-CAPILLARY: 145 mg/dL — AB (ref 70–99)
Glucose-Capillary: 81 mg/dL (ref 70–99)
Glucose-Capillary: 77 mg/dL (ref 70–99)
Glucose-Capillary: 170 mg/dL — ABNORMAL HIGH (ref 70–99)

## 2014-08-14 LAB — PREALBUMIN: PREALBUMIN: 13 mg/dL — AB (ref 21–43)

## 2014-08-14 MED ORDER — PENTAFLUOROPROP-TETRAFLUOROETH EX AERO
INHALATION_SPRAY | CUTANEOUS | Status: AC
  Administered 2014-08-14: 07:00:00
  Filled 2014-08-14: qty 103.5

## 2014-08-14 MED ORDER — VITAMIN B-1 100 MG PO TABS
100.0000 mg | ORAL_TABLET | Freq: Every day | ORAL | Status: DC
  Administered 2014-08-14 – 2014-08-15 (×2): 100 mg via ORAL
  Filled 2014-08-14 (×2): qty 1

## 2014-08-14 MED ORDER — HYDROMORPHONE HCL 1 MG/ML IJ SOLN
1.0000 mg | INTRAMUSCULAR | Status: DC | PRN
  Administered 2014-08-14 – 2014-08-23 (×15): 1 mg via INTRAVENOUS
  Filled 2014-08-14 (×13): qty 1

## 2014-08-14 MED ORDER — FOLIC ACID 1 MG PO TABS
1.0000 mg | ORAL_TABLET | Freq: Every day | ORAL | Status: DC
  Administered 2014-08-14 – 2014-08-15 (×2): 1 mg via ORAL
  Filled 2014-08-14 (×2): qty 1

## 2014-08-14 MED ORDER — OXYCODONE-ACETAMINOPHEN 5-325 MG PO TABS
1.0000 | ORAL_TABLET | ORAL | Status: DC | PRN
  Administered 2014-08-15 (×4): 2 via ORAL
  Administered 2014-08-16: 1 via ORAL
  Administered 2014-08-16 (×2): 2 via ORAL
  Administered 2014-08-16: 1 via ORAL
  Administered 2014-08-17 – 2014-08-19 (×7): 2 via ORAL
  Administered 2014-08-20: 1 via ORAL
  Administered 2014-08-20 – 2014-08-21 (×4): 2 via ORAL
  Administered 2014-08-21 – 2014-08-22 (×3): 1 via ORAL
  Administered 2014-08-23: 2 via ORAL
  Filled 2014-08-14 (×3): qty 2
  Filled 2014-08-14: qty 1
  Filled 2014-08-14 (×5): qty 2
  Filled 2014-08-14: qty 1
  Filled 2014-08-14 (×10): qty 2
  Filled 2014-08-14: qty 1

## 2014-08-14 MED ORDER — VANCOMYCIN HCL IN DEXTROSE 1-5 GM/200ML-% IV SOLN
1000.0000 mg | Freq: Once | INTRAVENOUS | Status: AC
  Administered 2014-08-14: 1000 mg via INTRAVENOUS
  Filled 2014-08-14 (×2): qty 200

## 2014-08-14 NOTE — Progress Notes (Addendum)
   [facility] [place]  Subjective  - On HD using lateral half of old graft.  Sore from surgery other wise doing fine.   Objective 121/49 93 98.2 F (36.8 C) (Oral) 16 93%  Intake/Output Summary (Last 24 hours) at 08/14/14 0750 Last data filed at 08/14/14 0300  Gross per 24 hour  Intake    400 ml  Output    700 ml  Net   -300 ml   Intraoperative Graft/wound culture pending   Incisions healing well No hematoma Tolerating HD with lateral half of old graft  Assessment/Planning: POD #1 Revision of medial graft Pending intraoperative cultures Do not use medial half of graft for 4 weeks F/U PRN  Clinton Gallant [facility] 08/14/2014 7:50 AM -- Some incisional soreness Successful dialysis via graft today Follow up cultures  Fabienne Bruns, MD [facility] [place] Office: [phone] Pager: [phone]   Laboratory Lab Results:  Recent Labs  08/13/14 0513 08/14/14 0400  WBC 16.5* 14.1*  HGB 10.5* 10.6*  HCT 33.3* 33.0*  PLT 272 288   BMET  Recent Labs  08/12/14 0400 08/13/14 0513  NA 134* 135  K 4.4 4.9  CL 93* 94*  CO2 25 22  GLUCOSE 81 79  BUN 46* 56*  CREATININE 7.66* 9.04*  CALCIUM 8.4 8.1*    COAG Lab Results  Component Value Date   INR 1.08 08/13/2014   INR 1.09 08/11/2014   INR 1.10 08/10/2014   No results found for: PTT

## 2014-08-14 NOTE — Care Management Note (Addendum)
    Page 1 of 2   08/23/2014     11:52:55 AM CARE MANAGEMENT NOTE 08/23/2014  Patient:  [name], [name]   Account Number:  [id number]  Date Initiated:  08/14/2014  Documentation initiated by:  Raynald Blend  Subjective/Objective Assessment:   Pt admitted with elevated troponin, end stage renal disease and thigh pain     Action/Plan:   Pt from [facility] and will return to [facility] upon discharge   Anticipated DC Date:  08/17/2014   Anticipated DC Plan:  SKILLED NURSING FACILITY  In-house referral  Clinical Social Worker         Choice offered to / List presented to:             Status of service:  Completed, signed off Medicare Important Message given?  YES (If response is "NO", the following Medicare IM given date fields will be blank) Date Medicare IM given:  08/14/2014 Medicare IM given by:  Raynald Blend Date Additional Medicare IM given:  08/17/2014 Additional Medicare IM given by:  Raynald Blend  Discharge Disposition:  SKILLED NURSING FACILITY  Per UR Regulation:  Reviewed for med. necessity/level of care/duration of stay  If discussed at Long Length of Stay Meetings, dates discussed:   08/16/2014  08/23/2014    Comments:  08-23-14 [address] [place] [phone] Medicare Important Message given? YES (If response is "NO", the following Medicare IM given date fields will be blank) Date Medicare IM given: Medicare IM given by: Tomi Bamberger  08-22-14 [address] RN,BSN [phone] Plan for Thrombectomy today. SNF once medically stable for d/c.   08-21-14 1627 Tomi Bamberger, RN,BSN [phone] Pt ready for d/c. RN assessed graft site and Vascular notified. CM will continue to monitor.   08/20/14 Raynald Blend, RN, BSN, [phone] Medicare Important Message given? YES   (If response is "NO", the following Medicare IM given date fields will be blank)   Date Medicare IM given:  08/20/14 Medicare IM given by:  Raynald Blend      08/15/14  Raynald Blend, RN, BSN [phone].  Pt CK level and liver enzymes continue to increase.  Pt had HD today and will have HD tomorrow.  Pt is scheduled for MRI 08/16/14 under sedation.  CM will continue to monitor for CM needs, No additional CM needs at this time.   08/14/14  Raynald Blend, RN, BSN [phone].  CM spoke with patient, pt came from [facility] and will return to [facility].  Pt will not need Home Health/equipment as ordered because he will be returning to SNF.   CM contacted social worker to make aware of disposition plan.

## 2014-08-14 NOTE — Progress Notes (Signed)
  [facility] Progress Note   Subjective: on HD  No complaints  Filed Vitals:   08/14/14 0802 08/14/14 0815 08/14/14 0841 08/14/14 0843  BP: 151/75 151/75 123/70 110/73  Pulse: 69 71 71 75  Temp:      TempSrc:      Resp:      Height:      Weight:      SpO2:       Exam: Alert, chronically ill No jvd Chest clear bilat RRR no RG Abd soft, obese, NTND L thigh AVG patent No LE edema Neuro is nf, Ox 3  HD: AF MTWFri 3.5h  2/3.5 Bath  116kg  Heparin 11,000   L thigh AVG Venofer 50/wk, no ESA        Assessment: 1. Hypotension - resolved. No +cultures. ^WBC resolving. On empiric vanc IV. 2. Bleeding L thigh AVG - no evidence of infection in OR, blood/ wound cx's neg so far; new medial limb was placed (old medial limb was not removed).  3. Rhabdomyolysis - CPK 16k 4. ^LFT's - due to muscle damage vs other (liver); per GI/ primary 5. 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 CAD hx stent 7. ESRD on HD 8. Anemia on IV Fe, Hb 10 9. MBD cont meds   Plan - HD today and short HD tomorrow.     Vinson Moselle MD  pager [phone]    cell [phone]  08/14/2014, 9:38 AM     Recent Labs Lab 08/12/14 0400 08/13/14 0513 08/14/14 0700  NA 134* 135 134*  K 4.4 4.9 5.0  CL 93* 94* 94*  CO2 25 22 23   GLUCOSE 81 79 101*  BUN 46* 56* 67*  CREATININE 7.66* 9.04* 10.61*  CALCIUM 8.4 8.1* 7.6*    Recent Labs Lab 08/12/14 0400 08/13/14 0513 08/14/14 0700  AST 1533* 1608* 1774*  ALT 283* 322* 318*  ALKPHOS 203* 195* 219*  BILITOT 1.8* 1.4* 1.1  PROT 7.4 7.2 6.8  ALBUMIN 2.1* 2.1* 2.1*    Recent Labs Lab 08/10/14 1248  08/12/14 0400 08/13/14 0513 08/14/14 0400  WBC 14.8*  < > 20.6* 16.5* 14.1*  NEUTROABS 13.5*  --   --   --   --   HGB 12.1*  < > 10.5* 10.5* 10.6*  HCT 37.7*  < > 33.5* 33.3* 33.0*  MCV 95.7  < > 96.5 96.2 95.7  PLT 253  < > 302 272 288  < > = values in this interval not displayed. Marland Kitchen antiseptic oral rinse  7 mL Mouth Rinse BID   . aspirin EC  325 mg Oral Daily  . Chlorhexidine Gluconate Cloth  6 each Topical Q0600  . folic acid  1 mg Intravenous Daily  . heparin  5,000 Units Subcutaneous 3 times per day  . insulin aspart  0-9 Units Subcutaneous TID WC  . latanoprost  1 drop Both Eyes QHS  . pentafluoroprop-tetrafluoroeth      . sodium bicarbonate  650 mg Oral BID  . thiamine  100 mg Intravenous Daily  . vancomycin  1,000 mg Intravenous Once   . sodium chloride 10 mL/hr at 08/13/14 1033   fentaNYL (SUBLIMAZE) injection, ondansetron **OR** ondansetron (ZOFRAN) IV, oxyCODONE-acetaminophen

## 2014-08-14 NOTE — Progress Notes (Signed)
ANTIBIOTIC CONSULT NOTE - FOLLOW UP  Pharmacy Consult for Vancomycin Indication: rule out sepsis  No Known Allergies  Patient Measurements: Height:  (180.3 cm) Weight: 252 lb 13.9 oz (114.7 kg) IBW/kg (Calculated) : 75.3  Labs:  Recent Labs  08/12/14 0400 08/13/14 0513 08/14/14 0400 08/14/14 0700  WBC 20.6* 16.5* 14.1*  --   HGB 10.5* 10.5* 10.6*  --   PLT 302 272 288  --   CREATININE 7.66* 9.04*  --  10.61*   Microbiology: Recent Results (from the past 720 hour(s))  MRSA PCR Screening     Status: None   Collection Time: 08/10/14  5:33 PM  Result Value Ref Range Status   MRSA by PCR NEGATIVE NEGATIVE Final    Comment:        The GeneXpert MRSA Assay (FDA approved for NASAL specimens only), is one component of a comprehensive MRSA colonization surveillance program. It is not intended to diagnose MRSA infection nor to guide or monitor treatment for MRSA infections.   Culture, blood (routine x 2)     Status: None (Preliminary result)   Collection Time: 08/10/14  6:08 PM  Result Value Ref Range Status   Specimen Description BLOOD LEFT ANTECUBITAL  Final   Special Requests   Final    BOTTLES DRAWN AEROBIC AND ANAEROBIC 10 CC BLUE 8CC RED   Culture   Final           BLOOD CULTURE RECEIVED NO GROWTH TO DATE CULTURE WILL BE HELD FOR 5 DAYS BEFORE ISSUING A FINAL NEGATIVE REPORT Performed at [facility]    Report Status PENDING  Incomplete  Culture, blood (routine x 2)     Status: None (Preliminary result)   Collection Time: 08/10/14  6:18 PM  Result Value Ref Range Status   Specimen Description BLOOD RIGHT HAND  Final   Special Requests BOTTLES DRAWN AEROBIC ONLY 8 CC  Final   Culture   Final           BLOOD CULTURE RECEIVED NO GROWTH TO DATE CULTURE WILL BE HELD FOR 5 DAYS BEFORE ISSUING A FINAL NEGATIVE REPORT Performed at [facility]    Report Status PENDING  Incomplete  Clostridium Difficile by PCR     Status: None   Collection Time:  08/13/14  1:35 AM  Result Value Ref Range Status   C difficile by pcr NEGATIVE NEGATIVE Final  Anaerobic culture     Status: None (Preliminary result)   Collection Time: 08/13/14 12:46 PM  Result Value Ref Range Status   Specimen Description WOUND LEFT GRAFT  Final   Special Requests POF ANCEF  Final   Gram Stain   Final    NO WBC SEEN NO SQUAMOUS EPITHELIAL CELLS SEEN NO ORGANISMS SEEN Performed at [facility]    Culture   Final    NO ANAEROBES ISOLATED; CULTURE IN PROGRESS FOR 5 DAYS Performed at [facility]    Report Status PENDING  Incomplete  Wound culture     Status: None (Preliminary result)   Collection Time: 08/13/14 12:46 PM  Result Value Ref Range Status   Specimen Description WOUND LEFT GRAFT  Final   Special Requests POF ANCEF  Final   Gram Stain   Final    NO WBC SEEN NO SQUAMOUS EPITHELIAL CELLS SEEN NO ORGANISMS SEEN Performed at [facility]    Culture NO GROWTH Performed at [facility]   Final   Report Status PENDING  Incomplete    Anti-infectives    Start     Dose/Rate Route Frequency Ordered Stop   08/14/14 0945  vancomycin (VANCOCIN) IVPB 1000 mg/200 mL premix     1,000 mg 200 mL/hr over 60 Minutes Intravenous  Once 08/14/14 0933     08/11/14 2000  vancomycin (VANCOCIN) 2,000 mg in sodium chloride 0.9 % 500 mL IVPB     2,000 mg 250 mL/hr over 120 Minutes Intravenous  Once 08/11/14 1803 08/11/14 2343   08/10/14 1700  piperacillin-tazobactam (ZOSYN) IVPB 2.25 g  Status:  Discontinued     2.25 g 100 mL/hr over 30 Minutes Intravenous 3 times per day 08/10/14 1646 08/12/14 1054      Assessment: 59 year old male with ESRD now s/p revision of medial graft continues on Vancomycin for rule out sepsis HD today  Last dose vancomycin 4/16 -- 2 gram loading dose  Goal of Therapy:  Appropriate dosing  Plan:  Vancomycin 1 gram iv x 1 dose after HD today Follow up for LOT  Thank you. Okey Regal,  PharmD [phone]  08/14/2014,9:59 AM

## 2014-08-14 NOTE — Progress Notes (Signed)
[facility] PROGRESS NOTE  MCLANE ARORA [name] DOB: [birth year] DOA: 08/10/2014 PCP: Dyke Maes, MD Interim summary: 63- year ol  Male with multiple medical  Problems including chronic diastolic heart failure, ESRD on HD 4 times a week, diet controlled DM, h/o hepatitis C, hypertension,  CAD s/p PCI, admitted for chest pain, back pain.  On admission he was found to be hypotensive and underwent HD on Friday 4 /14.  Assessment/Plan:  Hypotension: resolved.    Transaminitis and rhabdomyolysis probably from myositis: Unclear etiology.  Differential include shock liver, vs  Acute hepatitis vs rhabdomyolysis vs sepsis, source probably from the left thigh graft. US Abdomen unremarkable. He currently denies nausea or vomiting/ abdominal pain. Ct abdomen ad pelvis ordered for further evaluation, does not show any liver cirrhosis, but several granulomas in the liver, gall stones and bilateral renal cysts. Gastroenterology Dr Randa Evens and ID consult requested and recommendations given. His CK levels and his AST AND ALT continues to rise despite antibiotics. Mri ordered 2 days ago but couldn't be done as he needs it under sedation by anesthesia. Received a call from MRI , that they will done on Thursday.   HCV antibody positive: further management as outpatient.  Sepsis/Leukocytosis: improved . Elevated pro calcitonin , normal lactic acid level. Probably from sepsis from left thigh graft. But so far all his cultures are negative.  Zosyn discontinued by ID, added vancomycin empirically.    Chest pain / abnormal EKG/ ELEVATED troponin's CAD s/p PCI: chest pain resolved. Underwent stress myoview. Results are unremarkable. Cardiology signed off. Off Effient for the vascular procedure on 4/18.    Sepsis/ Bleeding from the left thigh HD graft with some ulceration medially: Could be the source of infection, vancomycin added to the regimen. His leukocytosis improving, . His sepsis seems to  be improving.  Vascular consulted and recommendations given. Underwent revision of the left thigh AV graft and he underwent HD on 4/19. The medial aspect of the graft is not to be used or touched for 4 weeks asp er vascular.   ESRD ON  HD: Further management as per renal. HD 4 times a week. Renal on board.    Anemia: Mild and normocytic.    Hyperkalemia: resolved on repeat labs today.   Diabetes Mellitus: CBG (last 3)   Recent Labs  08/13/14 2034 08/14/14 1149 08/14/14 1631  GLUCAP 72 77 170*   hgba1c is 5.4 and  Diet controlled DM.   Hypertension: controlled.    Chronic diastolic heart failure: He appears to be compensated.       Code Status: full code Family Communication: NONE at bedside Disposition Plan: pending further investigation..    Consultants:  Renal  Vascular  Cardiology   ID  Procedures:  Stress myoview  HD    Antibiotics:  Vancomycin 4/16  Zosyn 4/15 TILL 4/17  HPI/Subjective: No more bleeding from the thigh graft. Reports back pain today after HD.   Objective: Filed Vitals:   08/14/14 1145  BP:   Pulse:   Temp: 98.6 F (37 C)  Resp:     Intake/Output Summary (Last 24 hours) at 08/14/14 1811 Last data filed at 08/14/14 1106  Gross per 24 hour  Intake      0 ml  Output   2700 ml  Net  -2700 ml   Filed Weights   08/14/14 0540 08/14/14 0700 08/14/14 1106  Weight: 113.853 kg (251 lb) 114.7 kg (252 lb 13.9 oz) 112.2 kg (247 lb 5.7 oz)  Exam:   General:  Alert not indistress  Cardiovascular: s1s2  Respiratory: diminished air entry at bases  Abdomen: soft non tender non distended bowel sounds heard  Musculoskeletal: pedal edema present  Data Reviewed: Basic Metabolic Panel:  Recent Labs Lab 08/11/14 0710 08/12/14 0400 08/13/14 0513 08/14/14 0700 08/14/14 1220  NA 136 134* 135 134* 137  K 4.7 4.4 4.9 5.0 4.4  CL 94* 93* 94* 94* 99  CO2 GLUCOSE 68* 81 79 101* 82  BUN 33* 46* 56*  67* 31*  CREATININE 6.25* 7.66* 9.04* 10.61* 6.00*  CALCIUM 8.8 8.4 8.1* 7.6* 9.2  PHOS  --   --   --   --  4.5   Liver Function Tests:  Recent Labs Lab 08/10/14 1248 08/11/14 0710 08/12/14 0400 08/13/14 0513 08/14/14 0700 08/14/14 1220  AST 1223* 1218* 1533* 1608* 1774*  --   ALT 235* 234* 283* 322* 318*  --   ALKPHOS 248* 213* 203* 195* 219*  --   BILITOT 2.7* 2.1* 1.8* 1.4* 1.1  --   PROT 7.9 7.2 7.4 7.2 6.8  --   ALBUMIN 2.5* 2.2* 2.1* 2.1* 2.1* 2.3*    Recent Labs Lab 08/10/14 1830  LIPASE 31   No results for input(s): AMMONIA in the last 168 hours. CBC:  Recent Labs Lab 08/10/14 1248 08/11/14 0710 08/12/14 0400 08/13/14 0513 08/14/14 0400 08/14/14 1220  WBC 14.8* 22.3* 20.6* 16.5* 14.1* 13.7*  NEUTROABS 13.5*  --   --   --   --   --   HGB 12.1* 11.1* 10.5* 10.5* 10.6* 12.2*  HCT 37.7* 33.7* 33.5* 33.3* 33.0* 36.8*  MCV 95.7 96.0 96.5 96.2 95.7 94.8  PLT 253 268 302 272 288 271   Cardiac Enzymes:  Recent Labs Lab 08/10/14 1437 08/10/14 1830 08/11/14 1026 08/11/14 1542 08/11/14 2104 08/14/14 0700  CKTOTAL  --  11720* 14273*  --   --  16194*  TROPONINI 0.12*  --  1.98* 2.11* 2.37*  --    BNP (last 3 results) No results for input(s): BNP in the last 8760 hours.  ProBNP (last 3 results) No results for input(s): PROBNP in the last 8760 hours.  CBG:  Recent Labs Lab 08/13/14 1715 08/13/14 1841 08/13/14 2034 08/14/14 1149 08/14/14 1631  GLUCAP 69* 75 72 77 170*    Recent Results (from the past 240 hour(s))  MRSA PCR Screening     Status: None   Collection Time: 08/10/14  5:33 PM  Result Value Ref Range Status   MRSA by PCR NEGATIVE NEGATIVE Final    Comment:        The GeneXpert MRSA Assay (FDA approved for NASAL specimens only), is one component of a comprehensive MRSA colonization surveillance program. It is not intended to diagnose MRSA infection nor to guide or monitor treatment for MRSA infections.   Culture, blood  (routine x 2)     Status: None (Preliminary result)   Collection Time: 08/10/14  6:08 PM  Result Value Ref Range Status   Specimen Description BLOOD LEFT ANTECUBITAL  Final   Special Requests   Final    BOTTLES DRAWN AEROBIC AND ANAEROBIC 10 CC BLUE 8CC RED   Culture   Final           BLOOD CULTURE RECEIVED NO GROWTH TO DATE CULTURE WILL BE HELD FOR 5 DAYS BEFORE ISSUING A FINAL NEGATIVE REPORT Performed at [facility]    Report Status PENDING  Incomplete  Culture, blood (routine x 2)     Status: None (Preliminary result)   Collection Time: 08/10/14  6:18 PM  Result Value Ref Range Status   Specimen Description BLOOD RIGHT HAND  Final   Special Requests BOTTLES DRAWN AEROBIC ONLY 8 CC  Final   Culture   Final           BLOOD CULTURE RECEIVED NO GROWTH TO DATE CULTURE WILL BE HELD FOR 5 DAYS BEFORE ISSUING A FINAL NEGATIVE REPORT Performed at [facility]    Report Status PENDING  Incomplete  Clostridium Difficile by PCR     Status: None   Collection Time: 08/13/14  1:35 AM  Result Value Ref Range Status   C difficile by pcr NEGATIVE NEGATIVE Final  Anaerobic culture     Status: None (Preliminary result)   Collection Time: 08/13/14 12:46 PM  Result Value Ref Range Status   Specimen Description WOUND LEFT GRAFT  Final   Special Requests POF ANCEF  Final   Gram Stain   Final    NO WBC SEEN NO SQUAMOUS EPITHELIAL CELLS SEEN NO ORGANISMS SEEN Performed at [facility]    Culture   Final    NO ANAEROBES ISOLATED; CULTURE IN PROGRESS FOR 5 DAYS Performed at [facility]    Report Status PENDING  Incomplete  Wound culture     Status: None (Preliminary result)   Collection Time: 08/13/14 12:46 PM  Result Value Ref Range Status   Specimen Description WOUND LEFT GRAFT  Final   Special Requests POF ANCEF  Final   Gram Stain   Final    NO WBC SEEN NO SQUAMOUS EPITHELIAL CELLS SEEN NO ORGANISMS SEEN Performed at [facility]    Culture NO  GROWTH Performed at [facility]   Final   Report Status PENDING  Incomplete     Studies: Ct Abdomen Pelvis Wo Contrast  08/13/2014   CLINICAL DATA:  Abnormal liver function tests.  EXAM: CT ABDOMEN AND PELVIS WITHOUT CONTRAST  TECHNIQUE: Multidetector CT imaging of the abdomen and pelvis was performed following the standard protocol without IV contrast.  COMPARISON:  06/03/2010  FINDINGS: There is several tiny calcified granulomas in the liver. Liver parenchyma otherwise appears normal. Multiple stones in the gallbladder. No dilated bile ducts.  Spleen, pancreas, and adrenal glands are normal. Severe bilateral renal atrophy with numerous cysts in both kidneys consistent with chronic renal failure.  The bowel is normal except for a few diverticula in the ascending colon. Extensive arterial vascular calcification. No free air free fluid or adenopathy. There is a 4 cm fluid collection in the left inguinal region adjacent to the dialysis graft. This probably represents a seroma. Severe degenerative disc disease in the lower lumbar spine with severe spinal stenosis at L3-4 with a broad-based disc protrusion asymmetric into the right neural foramen.  IMPRESSION: No acute abnormality. Granulomas in the liver. Cholelithiasis. Probable seroma in the left inguinal region.   Electronically Signed   By: Francene Boyers M.D.   On: 08/13/2014 07:33    Scheduled Meds: . antiseptic oral rinse  7 mL Mouth Rinse BID  . aspirin EC  325 mg Oral Daily  . Chlorhexidine Gluconate Cloth  6 each Topical Q0600  . folic acid  1 mg Oral Daily  . heparin  5,000 Units Subcutaneous 3 times per day  . insulin aspart  0-9 Units Subcutaneous TID WC  . latanoprost  1 drop Both Eyes QHS  .  pentafluoroprop-tetrafluoroeth      . sodium bicarbonate  650 mg Oral BID  . thiamine  100 mg Oral Daily   Continuous Infusions: . sodium chloride 10 mL/hr at 08/13/14 1033    Principal Problem:   Sepsis associated  hypotension Active Problems:   End-stage renal disease on hemodialysis   History of placement of stent in LAD coronary artery   HTN (hypertension)   Radicular leg pain   Right-sided low back pain with right-sided sciatica   Elevated troponin   Abnormal transaminases   Metabolic encephalopathy   Chest pain   Shock circulatory   Pain in the chest   Weakness   Abnormal EKG   Abnormal liver function test   Lumbago   Diabetic foot ulcer   Myositis   NSTEMI (non-ST elevated myocardial infarction)   Thigh pain   Infection and inflammatory reaction due to cardiac device, implant, and graft    Time spent: 25 minutes    Talvin Christianson  [facility] Pager [phone] If 7PM-7AM, please contact night-coverage at [email or web address], password [facility] 08/14/2014, 6:11 PM  LOS: 4 days

## 2014-08-14 NOTE — Progress Notes (Signed)
[facility]  Date of Admission:  08/10/2014  Antibiotics: vancomycin  Subjective: No complaints, seen in dialysis, OR yesterday and graft without signs of infection, no pus  Objective: Temp:  [97.7 F (36.5 C)-98.6 F (37 C)] 98.6 F (37 C) (04/19 1145) Pulse Rate:  [60-99] 90 (04/19 1106) Resp:  [13-18] 15 (04/19 0700) BP: (98-173)/(49-102) 156/84 mmHg (04/19 1106) SpO2:  [93 %-100 %] 100 % (04/19 1145) Weight:  [247 lb 5.7 oz (112.2 kg)-252 lb 13.9 oz (114.7 kg)] 247 lb 5.7 oz (112.2 kg) (04/19 1106)  General: awake, alert, nad Skin: left graft site without erythema Lungs: CTA B Cor: RRR Abdomen: soft, nt, nd Ext: no edema  Lab Results Lab Results  Component Value Date   WBC 13.7* 08/14/2014   HGB 12.2* 08/14/2014   HCT 36.8* 08/14/2014   MCV 94.8 08/14/2014   PLT 271 08/14/2014    Lab Results  Component Value Date   CREATININE 6.00* 08/14/2014   BUN 31* 08/14/2014   NA 137 08/14/2014   K 4.4 08/14/2014   CL 99 08/14/2014   CO2 25 08/14/2014    Lab Results  Component Value Date   ALT 318* 08/14/2014   AST 1774* 08/14/2014   ALKPHOS 219* 08/14/2014   BILITOT 1.1 08/14/2014      Microbiology: Recent Results (from the past 240 hour(s))  MRSA PCR Screening     Status: None   Collection Time: 08/10/14  5:33 PM  Result Value Ref Range Status   MRSA by PCR NEGATIVE NEGATIVE Final    Comment:        The GeneXpert MRSA Assay (FDA approved for NASAL specimens only), is one component of a comprehensive MRSA colonization surveillance program. It is not intended to diagnose MRSA infection nor to guide or monitor treatment for MRSA infections.   Culture, blood (routine x 2)     Status: None (Preliminary result)   Collection Time: 08/10/14  6:08 PM  Result Value Ref Range Status   Specimen Description BLOOD LEFT ANTECUBITAL  Final   Special Requests   Final    BOTTLES DRAWN AEROBIC AND ANAEROBIC 10 CC BLUE 8CC RED   Culture    Final           BLOOD CULTURE RECEIVED NO GROWTH TO DATE CULTURE WILL BE HELD FOR 5 DAYS BEFORE ISSUING A FINAL NEGATIVE REPORT Performed at [facility]    Report Status PENDING  Incomplete  Culture, blood (routine x 2)     Status: None (Preliminary result)   Collection Time: 08/10/14  6:18 PM  Result Value Ref Range Status   Specimen Description BLOOD RIGHT HAND  Final   Special Requests BOTTLES DRAWN AEROBIC ONLY 8 CC  Final   Culture   Final           BLOOD CULTURE RECEIVED NO GROWTH TO DATE CULTURE WILL BE HELD FOR 5 DAYS BEFORE ISSUING A FINAL NEGATIVE REPORT Performed at [facility]    Report Status PENDING  Incomplete  Clostridium Difficile by PCR     Status: None   Collection Time: 08/13/14  1:35 AM  Result Value Ref Range Status   C difficile by pcr NEGATIVE NEGATIVE Final  Anaerobic culture     Status: None (Preliminary result)   Collection Time: 08/13/14 12:46 PM  Result Value Ref Range Status   Specimen Description WOUND LEFT GRAFT  Final   Special Requests POF ANCEF  Final  Gram Stain   Final    NO WBC SEEN NO SQUAMOUS EPITHELIAL CELLS SEEN NO ORGANISMS SEEN Performed at [facility]    Culture   Final    NO ANAEROBES ISOLATED; CULTURE IN PROGRESS FOR 5 DAYS Performed at [facility]    Report Status PENDING  Incomplete  Wound culture     Status: None (Preliminary result)   Collection Time: 08/13/14 12:46 PM  Result Value Ref Range Status   Specimen Description WOUND LEFT GRAFT  Final   Special Requests POF ANCEF  Final   Gram Stain   Final    NO WBC SEEN NO SQUAMOUS EPITHELIAL CELLS SEEN NO ORGANISMS SEEN Performed at [facility]    Culture NO GROWTH Performed at [facility]   Final   Report Status PENDING  Incomplete    Studies/Results: Ct Abdomen Pelvis Wo Contrast  08/13/2014   CLINICAL DATA:  Abnormal liver function tests.  EXAM: CT ABDOMEN AND PELVIS WITHOUT CONTRAST  TECHNIQUE:  Multidetector CT imaging of the abdomen and pelvis was performed following the standard protocol without IV contrast.  COMPARISON:  06/03/2010  FINDINGS: There is several tiny calcified granulomas in the liver. Liver parenchyma otherwise appears normal. Multiple stones in the gallbladder. No dilated bile ducts.  Spleen, pancreas, and adrenal glands are normal. Severe bilateral renal atrophy with numerous cysts in both kidneys consistent with chronic renal failure.  The bowel is normal except for a few diverticula in the ascending colon. Extensive arterial vascular calcification. No free air free fluid or adenopathy. There is a 4 cm fluid collection in the left inguinal region adjacent to the dialysis graft. This probably represents a seroma. Severe degenerative disc disease in the lower lumbar spine with severe spinal stenosis at L3-4 with a broad-based disc protrusion asymmetric into the right neural foramen.  IMPRESSION: No acute abnormality. Granulomas in the liver. Cholelithiasis. Probable seroma in the left inguinal region.   Electronically Signed   By: Francene Boyers M.D.   On: 08/13/2014 07:33   Dg Foot Complete Left  08/12/2014   CLINICAL DATA:  Pt has a diabetic foot ulcer on the plantar surface of his 1st toe. Pt was unable to move foot or hold foot in position  EXAM: LEFT FOOT - COMPLETE 3+ VIEW  COMPARISON:  None.  FINDINGS: No fracture. Joints are normally aligned. There are no areas of bone resorption to suggest osteomyelitis.  Vascular calcifications are noted. Soft tissues otherwise unremarkable.  IMPRESSION: 1. No fracture.  No evidence of osteomyelitis.   Electronically Signed   By: Amie Portland M.D.   On: 08/12/2014 14:44    Assessment/Plan:  1) septic shock, possible leg infection - graft without obvious infection.  MRI ordered today of leg.  On vancomycin.   2) HCV - chronic hepatitis C with positive RNA noted in April.  May consider treatment at a later date as an outpatient.  Can be  treated by myself in ID clinic or by Dr. Dulce Sellar of [facility].    3) transaminitis - LFTs remain elevated.  CK also elevated.  May be related to pyomyositis though unusual to have such high elevation of CK.  Will wait for MRI of leg.    Staci Righter, MD [facility] [email or web address] [phone] pager   [phone] cell 08/14/2014, 2:00 PM

## 2014-08-15 ENCOUNTER — Encounter (HOSPITAL_COMMUNITY)
Admission: EM | Disposition: A | Discharge status: Skilled Nursing Facility | Payer: Self-pay | Source: Home / Self Care | Attending: Internal Medicine

## 2014-08-15 ENCOUNTER — Encounter (HOSPITAL_COMMUNITY): Payer: Self-pay | Admitting: Vascular Surgery

## 2014-08-15 LAB — GLUCOSE, CAPILLARY
GLUCOSE-CAPILLARY: 99 mg/dL (ref 70–99)
GLUCOSE-CAPILLARY: 121 mg/dL — AB (ref 70–99)
Glucose-Capillary: 84 mg/dL (ref 70–99)
Glucose-Capillary: 98 mg/dL (ref 70–99)

## 2014-08-15 LAB — WOUND CULTURE
CULTURE: NO GROWTH
GRAM STAIN: NONE SEEN

## 2014-08-15 LAB — PROCALCITONIN: Procalcitonin: 5.26 ng/mL

## 2014-08-15 SURGERY — RADIOLOGY WITH ANESTHESIA
Anesthesia: General | Laterality: Left

## 2014-08-15 NOTE — Progress Notes (Signed)
[facility] Progress Note   Subjective: cant' walk, due to severe lower back pain and weakness of his legs  Filed Vitals:   08/14/14 2000 08/15/14 0000 08/15/14 0400 08/15/14 0841  BP:  134/69  122/65  Pulse:    77  Temp: 98.7 F (37.1 C) 98.7 F (37.1 C) 97.9 F (36.6 C) 97.7 F (36.5 C)  TempSrc: Oral Oral Oral Oral  Resp:  15  15  Height:      Weight:   112.9 kg (248 lb 14.4 oz)   SpO2:    98%   Exam: Alert, chronically ill No jvd Chest clear bilat RRR no RG Abd soft, obese, NTND L thigh AVG patent No LE edema Neuro left leg slightly weaker than R , no sensory deficit  HD: AF MTWFri 3.5h  2/3.5 [place]  116kg  Heparin 11,000   L thigh AVG Venofer 50/wk, no ESA        Assessment: 1. Bleeding L thigh AVG / ^WBC / septic picture - no gross AVG infection in OR, blood/ wound cx's neg; new medial limb was placed (old medial limb was not removed). Ono empiric abx with IV Vanc.  2. Rhabdomyolysis / ^LFT's - unclear cause 3. Back pain / LE weakness - cannot get OOB. Had severe lumbar spinal stenosis by MRI in March but was ambulatory then. Could have new discitis vs worsening DDD/spinal stenosis.  For MRI tomorrow under sedation.  4. 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 CAD hx stent 6. ESRD on home HD- will do 3x/ week HD while in hospital. 4h tomorrow and Sat 7. Anemia on IV Fe, Hb 10 8. MBD cont meds   Plan - HD and MRI tomorrow.     Vinson Moselle MD  pager [phone]    cell [phone]  08/15/2014, 8:54 AM     Recent Labs Lab 08/13/14 0513 08/14/14 0700 08/14/14 1220  NA 135 134* 137  K 4.9 5.0 4.4  CL 94* 94* 99  CO2 GLUCOSE 79 101* 82  BUN 56* 67* 31*  CREATININE 9.04* 10.61* 6.00*  CALCIUM 8.1* 7.6* 9.2  PHOS  --   --  4.5    Recent Labs Lab 08/12/14 0400 08/13/14 0513 08/14/14 0700 08/14/14 1220  AST 1533* 1608* 1774*  --   ALT 283* 322* 318*  --   ALKPHOS 203* 195* 219*  --   BILITOT 1.8* 1.4* 1.1  --    PROT 7.4 7.2 6.8  --   ALBUMIN 2.1* 2.1* 2.1* 2.3*    Recent Labs Lab 08/10/14 1248  08/13/14 0513 08/14/14 0400 08/14/14 1220  WBC 14.8*  < > 16.5* 14.1* 13.7*  NEUTROABS 13.5*  --   --   --   --   HGB 12.1*  < > 10.5* 10.6* 12.2*  HCT 37.7*  < > 33.3* 33.0* 36.8*  MCV 95.7  < > 96.2 95.7 94.8  PLT 253  < > 272 288 271  < > = values in this interval not displayed. Marland Kitchen antiseptic oral rinse  7 mL Mouth Rinse BID  . aspirin EC  325 mg Oral Daily  . Chlorhexidine Gluconate Cloth  6 each Topical Q0600  . folic acid  1 mg Oral Daily  . heparin  5,000 Units Subcutaneous 3 times per day  . insulin aspart  0-9 Units Subcutaneous TID WC  . latanoprost  1 drop Both Eyes QHS  . sodium bicarbonate  650 mg Oral BID  . thiamine  100 mg Oral Daily   . sodium chloride 10 mL/hr at 08/13/14 1033   fentaNYL (SUBLIMAZE) injection, HYDROmorphone (DILAUDID) injection, ondansetron **OR** ondansetron (ZOFRAN) IV, oxyCODONE-acetaminophen

## 2014-08-15 NOTE — Progress Notes (Signed)
[facility] PROGRESS NOTE  [name] [name] DOB: 05/13/1955 DOA: 08/10/2014 PCP: Dyke Maes, MD    Interim summary:  51- year ol  Male with multiple medical  Problems including chronic diastolic heart failure, ESRD on HD 4 times a week, diet controlled DM, h/o hepatitis C, hypertension,  CAD s/p PCI, admitted for chest pain, back pain.  On admission he was found to be hypotensive and underwent HD on Friday 4 /14.     HPI/Subjective: Patient in bed, denies any headache, no chest pain or abdominal pain. No shortness of breath. No bleeding from the left thigh       Assessment/Plan:  Sepsis/Leukocytosis with hypotension: improved .  Elevated pro calcitonin , normal lactic acid level. Probably from sepsis from left thigh graft. But so far all his cultures are negative. Zosyn discontinued by ID, added vancomycin empirically. Left thigh graft revision done by vascular surgery. MRI of the left thigh pending for 08/16/2014.   Transaminitis and rhabdomyolysis probably from myositis: Elevated liver enzymes likely due to combination of hypotension with shock liver, rhabdomyolysis being on high-dose statin. Seen by GI. Does have history of hep C and remote history of hep B. No further GI workup per gastroenterology besides outpatient follow-up for HCV. Trend liver enzymes.   Rhabdomyolysis. Left thigh MRI under sedation ordered for 08/16/2014, was also on high-dose statin. Monitor trend of CK   Hx of hepatitis C infection. Elevated viral load, will follow with GI outpatient along with ID.   Chest pain / abnormal EKG/ ELEVATED troponin's CAD s/p PCI: chest pain resolved. Underwent stress myoview. Results are unremarkable. Cardiology signed off. Off Effient for the vascular procedure on 4/18.    Sepsis/ Bleeding from the left thigh HD graft with some ulceration medially: Could be the source of infection, vancomycin added to the regimen. His leukocytosis improving, .  His sepsis seems to be improving.  Vascular consulted and recommendations given. Underwent revision of the left thigh AV graft and he underwent HD on 4/19. The medial aspect of the graft is not to be used or touched for 4 weeks asp er vascular.    ESRD ON  HD: Further management as per renal. HD 4 times a week. Renal on board.    Anemia:AOCD Mild and normocytic.    Hyperkalemia: resolved on repeat labs today.    Diabetes Mellitus: CBG (last 3)   Recent Labs  08/14/14 2116 08/15/14 0755 08/15/14 1207  GLUCAP 145* 84 99   hgba1c is 5.4 and  Diet controlled DM.     Hypertension: controlled.    Chronic diastolic heart failure EF not mentioned on last echo : He appears to be compensated.     DVT prophylaxis  Heparin  Code Status: full code Family Communication: NONE at bedside Disposition Plan: pending further investigation..    Consultants:  Renal  Vascular  Cardiology   ID  GI  Procedures:  Stress myoview  HD  Revision of the left thigh medial graft  Antibiotics:  Vancomycin 4/16  Zosyn 4/15 TILL 4/17   Objective: Filed Vitals:   08/15/14 1205  BP: 154/82  Pulse: 75  Temp: 98.5 F (36.9 C)  Resp: 16    Intake/Output Summary (Last 24 hours) at 08/15/14 1406 Last data filed at 08/15/14 1314  Gross per 24 hour  Intake 1554.5 ml  Output    601 ml  Net  953.5 ml   Filed Weights   08/14/14 0700 08/14/14 1106 08/15/14 0400  Weight: 114.7  kg (252 lb 13.9 oz) 112.2 kg (247 lb 5.7 oz) 112.9 kg (248 lb 14.4 oz)    Exam:   General:  Alert not indistress  Cardiovascular: s1s2  Respiratory: diminished air entry at bases  Abdomen: soft non tender non distended bowel sounds heard  Musculoskeletal: pedal edema present  L thigh - incision site and a bandage, mild surrounding erythema, no physical signs of compartment syndrome in the left thigh  Data Reviewed: Basic Metabolic Panel:  Recent Labs Lab 08/11/14 0710 08/12/14 0400  08/13/14 0513 08/14/14 0700 08/14/14 1220  NA 136 134* 135 134* 137  K 4.7 4.4 4.9 5.0 4.4  CL 94* 93* 94* 94* 99  CO2 24 25 22 23 25   GLUCOSE 68* 81 79 101* 82  BUN 33* 46* 56* 67* 31*  CREATININE 6.25* 7.66* 9.04* 10.61* 6.00*  CALCIUM 8.8 8.4 8.1* 7.6* 9.2  PHOS  --   --   --   --  4.5   Liver Function Tests:  Recent Labs Lab 08/10/14 1248 08/11/14 0710 08/12/14 0400 08/13/14 0513 08/14/14 0700 08/14/14 1220  AST 1223* 1218* 1533* 1608* 1774*  --   ALT 235* 234* 283* 322* 318*  --   ALKPHOS 248* 213* 203* 195* 219*  --   BILITOT 2.7* 2.1* 1.8* 1.4* 1.1  --   PROT 7.9 7.2 7.4 7.2 6.8  --   ALBUMIN 2.5* 2.2* 2.1* 2.1* 2.1* 2.3*    Recent Labs Lab 08/10/14 1830  LIPASE 31   No results for input(s): AMMONIA in the last 168 hours. CBC:  Recent Labs Lab 08/10/14 1248 08/11/14 0710 08/12/14 0400 08/13/14 0513 08/14/14 0400 08/14/14 1220  WBC 14.8* 22.3* 20.6* 16.5* 14.1* 13.7*  NEUTROABS 13.5*  --   --   --   --   --   HGB 12.1* 11.1* 10.5* 10.5* 10.6* 12.2*  HCT 37.7* 33.7* 33.5* 33.3* 33.0* 36.8*  MCV 95.7 96.0 96.5 96.2 95.7 94.8  PLT 253 268 302 272 288 271   Cardiac Enzymes:  Recent Labs Lab 08/10/14 1437 08/10/14 1830 08/11/14 1026 08/11/14 1542 08/11/14 2104 08/14/14 0700  CKTOTAL  --  11720* 14273*  --   --  16194*  TROPONINI 0.12*  --  1.98* 2.11* 2.37*  --    BNP (last 3 results) No results for input(s): BNP in the last 8760 hours.  ProBNP (last 3 results) No results for input(s): PROBNP in the last 8760 hours.  CBG:  Recent Labs Lab 08/14/14 1149 08/14/14 1631 08/14/14 2116 08/15/14 0755 08/15/14 1207  GLUCAP 77 170* 145* 84 99    Recent Results (from the past 240 hour(s))  MRSA PCR Screening     Status: None   Collection Time: 08/10/14  5:33 PM  Result Value Ref Range Status   MRSA by PCR NEGATIVE NEGATIVE Final    Comment:        The GeneXpert MRSA Assay (FDA approved for NASAL specimens only), is one component of  a comprehensive MRSA colonization surveillance program. It is not intended to diagnose MRSA infection nor to guide or monitor treatment for MRSA infections.   Culture, blood (routine x 2)     Status: None (Preliminary result)   Collection Time: 08/10/14  6:08 PM  Result Value Ref Range Status   Specimen Description BLOOD LEFT ANTECUBITAL  Final   Special Requests   Final    BOTTLES DRAWN AEROBIC AND ANAEROBIC 10 CC BLUE 8CC RED   Culture   Final  BLOOD CULTURE RECEIVED NO GROWTH TO DATE CULTURE WILL BE HELD FOR 5 DAYS BEFORE ISSUING A FINAL NEGATIVE REPORT Performed at [facility]    Report Status PENDING  Incomplete  Culture, blood (routine x 2)     Status: None (Preliminary result)   Collection Time: 08/10/14  6:18 PM  Result Value Ref Range Status   Specimen Description BLOOD RIGHT HAND  Final   Special Requests BOTTLES DRAWN AEROBIC ONLY 8 CC  Final   Culture   Final           BLOOD CULTURE RECEIVED NO GROWTH TO DATE CULTURE WILL BE HELD FOR 5 DAYS BEFORE ISSUING A FINAL NEGATIVE REPORT Performed at [facility]    Report Status PENDING  Incomplete  Clostridium Difficile by PCR     Status: None   Collection Time: 08/13/14  1:35 AM  Result Value Ref Range Status   C difficile by pcr NEGATIVE NEGATIVE Final  Anaerobic culture     Status: None (Preliminary result)   Collection Time: 08/13/14 12:46 PM  Result Value Ref Range Status   Specimen Description WOUND LEFT GRAFT  Final   Special Requests POF ANCEF  Final   Gram Stain   Final    NO WBC SEEN NO SQUAMOUS EPITHELIAL CELLS SEEN NO ORGANISMS SEEN Performed at [facility]    Culture   Final    NO ANAEROBES ISOLATED; CULTURE IN PROGRESS FOR 5 DAYS Performed at [facility]    Report Status PENDING  Incomplete  Wound culture     Status: None   Collection Time: 08/13/14 12:46 PM  Result Value Ref Range Status   Specimen Description WOUND LEFT GRAFT  Final   Special  Requests POF ANCEF  Final   Gram Stain   Final    NO WBC SEEN NO SQUAMOUS EPITHELIAL CELLS SEEN NO ORGANISMS SEEN Performed at [facility]    Culture   Final    NO GROWTH 2 DAYS Performed at [facility]    Report Status 08/15/2014 FINAL  Final     Studies: No results found.  Scheduled Meds: . antiseptic oral rinse  7 mL Mouth Rinse BID  . aspirin EC  325 mg Oral Daily  . Chlorhexidine Gluconate Cloth  6 each Topical Q0600  . folic acid  1 mg Oral Daily  . heparin  5,000 Units Subcutaneous 3 times per day  . insulin aspart  0-9 Units Subcutaneous TID WC  . latanoprost  1 drop Both Eyes QHS  . thiamine  100 mg Oral Daily   Continuous Infusions: . sodium chloride 10 mL/hr at 08/13/14 1033    Principal Problem:   Sepsis associated hypotension Active Problems:   End-stage renal disease on hemodialysis   History of placement of stent in LAD coronary artery   HTN (hypertension)   Radicular leg pain   Right-sided low back pain with right-sided sciatica   Elevated troponin   Abnormal transaminases   Metabolic encephalopathy   Chest pain   Shock circulatory   Pain in the chest   Weakness   Abnormal EKG   Abnormal liver function test   Lumbago   Diabetic foot ulcer   Myositis   NSTEMI (non-ST elevated myocardial infarction)   Thigh pain   Infection and inflammatory reaction due to cardiac device, implant, and graft   Back pain    Time spent: 25 minutes    [facility]  [facility] Pager [phone]  If 7PM-7AM, please contact night-coverage at [email or web address], password [email or web address] 08/15/2014, 2:06 PM  LOS: 5 days

## 2014-08-15 NOTE — Progress Notes (Signed)
Chaplain received consult and responded.   [name] presented as calm and conversational. He was very expressive and enjoyed the visit.   His chief concern was around his rehab and his home.   [name] spouse is dependent upon him and his finances as she does not work. He voiced that in order to continue rehab at "[facility]" he has to "sign over his entire check". " I can't do that and put my wife in the streets"   [name] voiced that he needed some assistance exploring options that would lead to a more positive outcome. If no other options are available, he noted he would return home to his wife instead of continuing rehab at her expense.   His mother is still alive and receives positive support from her as they speak often. His wife does not drive and one of his sisters isn't being very helpful. In fact, she has been encouraging him to continue rehab despite him being emphatic about the possibility of them losing their residence because she does not work.   He expressed having to speak with someone to clear his mind.   He is vocal about the great care he is receiving from his nurses and techs, " they have been excellent to me as much as I have been mashing that button"   Will continue to follow.   Recommend consult to Social Work  [name] 08/15/2014 11:30 AM

## 2014-08-16 ENCOUNTER — Inpatient Hospital Stay (HOSPITAL_COMMUNITY): Payer: Medicare Other | Admitting: Certified Registered"

## 2014-08-16 ENCOUNTER — Inpatient Hospital Stay (HOSPITAL_COMMUNITY): Payer: Medicare Other

## 2014-08-16 ENCOUNTER — Other Ambulatory Visit: Payer: Medicare Other

## 2014-08-16 ENCOUNTER — Encounter (HOSPITAL_COMMUNITY)
Admission: EM | Disposition: A | Discharge status: Skilled Nursing Facility | Payer: Self-pay | Source: Home / Self Care | Attending: Internal Medicine

## 2014-08-16 HISTORY — PX: RADIOLOGY WITH ANESTHESIA: SHX6223

## 2014-08-16 LAB — COMPREHENSIVE METABOLIC PANEL
ALT: 150 U/L — AB (ref 0–53)
ANION GAP: 16 — AB (ref 5–15)
AST: 1498 U/L — ABNORMAL HIGH (ref 0–37)
Albumin: 2.1 g/dL — ABNORMAL LOW (ref 3.5–5.2)
Alkaline Phosphatase: 150 U/L — ABNORMAL HIGH (ref 39–117)
BUN: 49 mg/dL — ABNORMAL HIGH (ref 6–23)
CALCIUM: 7.6 mg/dL — AB (ref 8.4–10.5)
CO2: 22 mmol/L (ref 19–32)
CREATININE: 9.1 mg/dL — AB (ref 0.50–1.35)
Chloride: 97 mmol/L (ref 96–112)
GFR calc non Af Amer: 6 mL/min — ABNORMAL LOW (ref >90)
GFR, EST AFRICAN AMERICAN: 6 mL/min — AB (ref >90)
GLUCOSE: 70 mg/dL (ref 70–99)
Potassium: 4.6 mmol/L (ref 3.5–5.1)
Sodium: 135 mmol/L (ref 135–145)
Total Bilirubin: 0.7 mg/dL (ref 0.3–1.2)
Total Protein: 6.6 g/dL (ref 6.0–8.3)

## 2014-08-16 LAB — GLUCOSE, CAPILLARY
Glucose-Capillary: 78 mg/dL (ref 70–99)
Glucose-Capillary: 94 mg/dL (ref 70–99)
Glucose-Capillary: 96 mg/dL (ref 70–99)
Glucose-Capillary: 90 mg/dL (ref 70–99)

## 2014-08-16 LAB — CBC
HCT: 32.8 % — ABNORMAL LOW (ref 39.0–52.0)
HEMOGLOBIN: 10.5 g/dL — AB (ref 13.0–17.0)
MCH: 31 pg (ref 26.0–34.0)
MCHC: 32 g/dL (ref 30.0–36.0)
MCV: 96.8 fL (ref 78.0–100.0)
Platelets: 282 10*3/uL (ref 150–400)
RBC: 3.39 MIL/uL — ABNORMAL LOW (ref 4.22–5.81)
RDW: 17.9 % — ABNORMAL HIGH (ref 11.5–15.5)
WBC: 18.6 10*3/uL — ABNORMAL HIGH (ref 4.0–10.5)

## 2014-08-16 LAB — CK: Total CK: 9482 U/L — ABNORMAL HIGH (ref 7–232)

## 2014-08-16 LAB — HCV RNA QUANT RFLX ULTRA OR GENOTYP
HCV QUANT LOG: 6.19 {Log} — AB (ref <1.18)
HCV QUANT: 1540452 [IU]/mL — AB (ref <15)

## 2014-08-16 SURGERY — RADIOLOGY WITH ANESTHESIA
Anesthesia: General

## 2014-08-16 MED ORDER — LIDOCAINE HCL (PF) 1 % IJ SOLN: 5.0000 mL | INTRAMUSCULAR | Status: DC | PRN

## 2014-08-16 MED ORDER — SODIUM CHLORIDE 0.9 % IV SOLN
INTRAVENOUS | Status: DC
  Administered 2014-08-16: 08:00:00 via INTRAVENOUS

## 2014-08-16 MED ORDER — MEPERIDINE HCL 25 MG/ML IJ SOLN: 6.2500 mg | INTRAMUSCULAR | Status: DC | PRN

## 2014-08-16 MED ORDER — SODIUM CHLORIDE 0.9 % IV SOLN: 100.0000 mL | INTRAVENOUS | Status: DC | PRN

## 2014-08-16 MED ORDER — PENTAFLUOROPROP-TETRAFLUOROETH EX AERO: 1.0000 "application " | INHALATION_SPRAY | CUTANEOUS | Status: DC | PRN

## 2014-08-16 MED ORDER — FENTANYL CITRATE (PF) 100 MCG/2ML IJ SOLN: 25.0000 ug | INTRAMUSCULAR | Status: DC | PRN

## 2014-08-16 MED ORDER — HEPARIN SODIUM (PORCINE) 1000 UNIT/ML DIALYSIS: 4000.0000 [IU] | INTRAMUSCULAR | Status: DC | PRN

## 2014-08-16 MED ORDER — LIDOCAINE-PRILOCAINE 2.5-2.5 % EX CREA
1.0000 "application " | TOPICAL_CREAM | CUTANEOUS | Status: DC | PRN
  Filled 2014-08-16: qty 5

## 2014-08-16 MED ORDER — HEPARIN SODIUM (PORCINE) 1000 UNIT/ML DIALYSIS: 1000.0000 [IU] | INTRAMUSCULAR | Status: DC | PRN

## 2014-08-16 MED ORDER — VANCOMYCIN HCL IN DEXTROSE 1-5 GM/200ML-% IV SOLN
1000.0000 mg | Freq: Once | INTRAVENOUS | Status: AC
  Administered 2014-08-16: 1000 mg via INTRAVENOUS
  Filled 2014-08-16: qty 200

## 2014-08-16 MED ORDER — LIDOCAINE-PRILOCAINE 2.5-2.5 % EX CREA: 1.0000 "application " | TOPICAL_CREAM | CUTANEOUS | Status: DC | PRN

## 2014-08-16 MED ORDER — NEPRO/CARBSTEADY PO LIQD: 237.0000 mL | ORAL | Status: DC | PRN

## 2014-08-16 MED ORDER — ALTEPLASE 2 MG IJ SOLR: 2.0000 mg | Freq: Once | INTRAMUSCULAR | Status: DC | PRN

## 2014-08-16 MED ORDER — PROMETHAZINE HCL 25 MG/ML IJ SOLN: 6.2500 mg | INTRAMUSCULAR | Status: DC | PRN

## 2014-08-16 MED ORDER — NEPRO/CARBSTEADY PO LIQD
237.0000 mL | ORAL | Status: DC | PRN
  Filled 2014-08-16: qty 237

## 2014-08-16 MED ORDER — OXYCODONE-ACETAMINOPHEN 5-325 MG PO TABS
ORAL_TABLET | ORAL | Status: AC
  Filled 2014-08-16: qty 1

## 2014-08-16 MED ORDER — ALTEPLASE 2 MG IJ SOLR
2.0000 mg | Freq: Once | INTRAMUSCULAR | Status: DC | PRN
  Filled 2014-08-16: qty 2

## 2014-08-16 NOTE — Progress Notes (Signed)
ANTIBIOTIC CONSULT NOTE - FOLLOW UP  Pharmacy Consult for Vancomycin Indication: rule out sepsis / cellulitis  No Known Allergies  Patient Measurements: Height:  (180.3 cm) Weight: 250 lb 9.6 oz (113.671 kg) IBW/kg (Calculated) : 75.3  Labs:  Recent Labs  08/14/14 0400 08/14/14 0700 08/14/14 1220 08/16/14 0545  WBC 14.1*  --  13.7* 18.6*  HGB 10.6*  --  12.2* 10.5*  PLT 288  --  271 282  CREATININE  --  10.61* 6.00* 9.10*   Microbiology: Recent Results (from the past 720 hour(s))  MRSA PCR Screening     Status: None   Collection Time: 08/10/14  5:33 PM  Result Value Ref Range Status   MRSA by PCR NEGATIVE NEGATIVE Final    Comment:        The GeneXpert MRSA Assay (FDA approved for NASAL specimens only), is one component of a comprehensive MRSA colonization surveillance program. It is not intended to diagnose MRSA infection nor to guide or monitor treatment for MRSA infections.   Culture, blood (routine x 2)     Status: None (Preliminary result)   Collection Time: 08/10/14  6:08 PM  Result Value Ref Range Status   Specimen Description BLOOD LEFT ANTECUBITAL  Final   Special Requests   Final    BOTTLES DRAWN AEROBIC AND ANAEROBIC 10 CC BLUE 8CC RED   Culture   Final           BLOOD CULTURE RECEIVED NO GROWTH TO DATE CULTURE WILL BE HELD FOR 5 DAYS BEFORE ISSUING A FINAL NEGATIVE REPORT Performed at [facility]    Report Status PENDING  Incomplete  Culture, blood (routine x 2)     Status: None (Preliminary result)   Collection Time: 08/10/14  6:18 PM  Result Value Ref Range Status   Specimen Description BLOOD RIGHT HAND  Final   Special Requests BOTTLES DRAWN AEROBIC ONLY 8 CC  Final   Culture   Final           BLOOD CULTURE RECEIVED NO GROWTH TO DATE CULTURE WILL BE HELD FOR 5 DAYS BEFORE ISSUING A FINAL NEGATIVE REPORT Performed at [facility]    Report Status PENDING  Incomplete  Clostridium Difficile by PCR     Status: None   Collection Time: 08/13/14  1:35 AM  Result Value Ref Range Status   C difficile by pcr NEGATIVE NEGATIVE Final  Anaerobic culture     Status: None (Preliminary result)   Collection Time: 08/13/14 12:46 PM  Result Value Ref Range Status   Specimen Description WOUND LEFT GRAFT  Final   Special Requests POF ANCEF  Final   Gram Stain   Final    NO WBC SEEN NO SQUAMOUS EPITHELIAL CELLS SEEN NO ORGANISMS SEEN Performed at [facility]    Culture   Final    NO ANAEROBES ISOLATED; CULTURE IN PROGRESS FOR 5 DAYS Performed at [facility]    Report Status PENDING  Incomplete  Wound culture     Status: None   Collection Time: 08/13/14 12:46 PM  Result Value Ref Range Status   Specimen Description WOUND LEFT GRAFT  Final   Special Requests POF ANCEF  Final   Gram Stain   Final    NO WBC SEEN NO SQUAMOUS EPITHELIAL CELLS SEEN NO ORGANISMS SEEN Performed at [facility]    Culture   Final    NO GROWTH 2 DAYS Performed at [facility]  Report Status 08/15/2014 FINAL  Final    Anti-infectives    Start     Dose/Rate Route Frequency Ordered Stop   08/16/14 1200  vancomycin (VANCOCIN) IVPB 1000 mg/200 mL premix     1,000 mg 200 mL/hr over 60 Minutes Intravenous  Once 08/16/14 1145     08/14/14 0945  vancomycin (VANCOCIN) IVPB 1000 mg/200 mL premix     1,000 mg 200 mL/hr over 60 Minutes Intravenous  Once 08/14/14 0933 08/14/14 1306   08/11/14 2000  vancomycin (VANCOCIN) 2,000 mg in sodium chloride 0.9 % 500 mL IVPB     2,000 mg 250 mL/hr over 120 Minutes Intravenous  Once 08/11/14 1803 08/11/14 2343   08/10/14 1700  piperacillin-tazobactam (ZOSYN) IVPB 2.25 g  Status:  Discontinued     2.25 g 100 mL/hr over 30 Minutes Intravenous 3 times per day 08/10/14 1646 08/12/14 1054      Assessment: 59 year old male with ESRD now s/p revision of medial graft continues on Vancomycin for rule out sepsis HD today Day # 7 of vancomycin  Last dose vancomycin  4/16 -- 2 gram loading dose  Goal of Therapy:  Appropriate dosing  Plan:  Vancomycin 1 gram iv x 1 dose after HD today Follow up for LOT  Thank you. Okey Regal, PharmD [phone]  08/16/2014,11:46 AM

## 2014-08-16 NOTE — Clinical Social Work Note (Signed)
Clinical Social Work Assessment  Patient Details  Name: [name] MRN: [id number] Date of Birth: 05-27-1955  Date of referral:  08/16/14               Reason for consult:  Facility Placement, Discharge Planning                Permission sought to share information with:  Facility Industrial/product designer granted to share information::  Yes, Verbal Permission Granted   Housing/Transportation Living arrangements for the past 2 months:  Skilled Nursing Facility [facility]) Source of Information:  Patient, Facility Patient Interpreter Needed:  None Criminal Activity/Legal Involvement Pertinent to Current Situation/Hospitalization:  No - Comment as needed Significant Relationships:  Spouse, Parents Lives with:  Facility Resident Need for family participation in patient care:  No (Coment)  Care giving concerns: Pt currently at facility for ST rehab. No concerns about care at facility. Pt with limited home support should pt dc home.   Social Worker assessment / plan:  Pt confirmed he was admitted from SNF where he has been for about two months for ST rehab. Pt reports plan was ST because his wife is at home. However, facility reports prior to SNF admission pt was living with his mother who was unable to provide support and plan was long term care. CSW to assess this further should plan be for dc home over SNF. Pt has concerns regarding SNF payment. Pt unsure if he will need to use his Medicaid benefit and therefore reliquish his check to the facility. Pt reports he will not be able to do this because his wife is not working and depends on this check to pay household bills. Pt reports that if Medicare days are over he will plan to dc home with wife. CSW has asked facility to check into pt Medicare SNF days and notify pt.   Insurance information:  Medicare PT Recommendations:  Skilled Nursing Facility  Patient/Family's Response to care:  Pt unsure of plan at this  time. He confirmed he is waiting to hear from MD on medical plan. Pt waiting to hear from facility for dc planning.  Patient/Family's Understanding of and Emotional Response to Diagnosis, Current Treatment, and Prognosis:  CSW unsure of pt understanding of current condition. Pt focused on financial concerns and unwilling at this time to discuss safety concerns for home dc.  Emotional Assessment Appearance:  Disheveled Affect (typically observed):  Appropriate, Quiet, Restless Orientation:  Oriented to Self, Oriented to Place, Oriented to  Time, Oriented to Situation Alcohol / Substance use:  Not Applicable Psych involvement (Current and /or in the community):  No (Comment)  Discharge Needs  Concerns to be addressed:  Discharge Planning Concerns Readmission within the last 30 days:  No Current discharge risk:  Inadequate Financial Supports, Lack of support system Barriers to Discharge:  Continued Medical Work up   [employer] [phone]

## 2014-08-16 NOTE — Anesthesia Preprocedure Evaluation (Addendum)
Anesthesia Evaluation  Patient identified by MRN, date of birth, ID band Patient awake    Reviewed: Allergy & Precautions, NPO status , reviewed documented beta blocker date and time   History of Anesthesia Complications Negative for: history of anesthetic complications  Airway Mallampati: II  TM Distance: >3 FB Neck ROM: Full    Dental  (+) Edentulous Lower, Edentulous Upper   Pulmonary Current Smoker,  breath sounds clear to auscultation        Cardiovascular hypertension, + CAD, + Past MI, + Cardiac Stents and + Peripheral Vascular Disease Rhythm:Regular Rate:Normal  '15 EF 55% 4/16 stress myoview: No reversible ischemia or infarction, Hypokinesia of the inferior septal wall, Left ventricular ejection fraction 52%    Neuro/Psych  Headaches, Anxiety Chronic pain: narcotics Bell's palsy    GI/Hepatic GERD-  Medicated and Controlled,(+) Hepatitis -, BMarkedly elevated LFTs   Endo/Other  diabetes, Type 2  Renal/GU ESRF and DialysisRenal disease (K+ 4.9)     Musculoskeletal   Abdominal (+) + obese,   Peds  Hematology  (+) Blood dyscrasia (effient, Hb 10.5), ,   Anesthesia Other Findings   Reproductive/Obstetrics                            Anesthesia Physical  Anesthesia Plan  ASA: III  Anesthesia Plan: General   Post-op Pain Management:    Induction: Intravenous  Airway Management Planned: LMA  Additional Equipment:   Intra-op Plan:   Post-operative Plan: Extubation in OR  Informed Consent: I have reviewed the patients History and Physical, chart, labs and discussed the procedure including the risks, benefits and alternatives for the proposed anesthesia with the patient or authorized representative who has indicated his/her understanding and acceptance.   Dental advisory given  Plan Discussed with: CRNA  Anesthesia Plan Comments:         Anesthesia Quick  Evaluation

## 2014-08-16 NOTE — Progress Notes (Signed)
PT Cancellation Note  Patient Details Name: [name] MRN: [id number] DOB: 1955-07-22   Cancelled Treatment:    Reason Eval/Treat Not Completed: Patient at procedure or test/unavailable (Attempted in AM and pt in MRI and attempted in PM and pt in HD.)   [facility] 08/16/2014, 5:01 PM  [facility] PT [phone]

## 2014-08-16 NOTE — Transfer of Care (Signed)
Immediate Anesthesia Transfer of Care Note  Patient: [name]  Procedure(s) Performed: Procedure(s): MRI (N/A)  Patient Location: PACU  Anesthesia Type:General  Level of Consciousness: awake, alert , oriented and patient cooperative  Airway & Oxygen Therapy: Patient Spontanous Breathing and Patient connected to face mask oxygen  Post-op Assessment: Report given to RN, Post -op Vital signs reviewed and stable and Patient moving all extremities  Post vital signs: Reviewed and stable  Last Vitals:  Filed Vitals:   08/16/14 0500  BP: 138/69  Pulse: 70  Temp: 36.5 C  Resp: 14    Complications: No apparent anesthesia complications

## 2014-08-16 NOTE — Anesthesia Postprocedure Evaluation (Signed)
Anesthesia Post Note  Patient: [name]  Procedure(s) Performed: Procedure(s) (LRB): MRI (N/A)  Anesthesia type: General  Patient location: PACU  Post pain: Pain level controlled  Post assessment: Post-op Vital signs reviewed  Last Vitals: BP 107/58 mmHg  Pulse 70  Temp(Src) 36.7 C (Oral)  Resp 14  Ht 5\' 11"  (1.803 m)  Wt 250 lb 9.6 oz (113.671 kg)  BMI 34.97 kg/m2  SpO2 100%  Post vital signs: Reviewed  Level of consciousness: sedated  Complications: No apparent anesthesia complications

## 2014-08-16 NOTE — Progress Notes (Signed)
Patient's morning CBG 78, awake, alert, oriented.  On the way down to short stay for sedation MRI.  CBG reported to receiving nurse in short stay.  Colman Cater

## 2014-08-16 NOTE — Progress Notes (Signed)
[facility] Progress Note   Subjective: MRI no new changes  Filed Vitals:   08/16/14 1700 08/16/14 1730 08/16/14 1800 08/16/14 1830  BP: 133/56 123/55 102/35 125/78  Pulse: 80 84 80 81  Temp:    97.9 F (36.6 C)  TempSrc:    Oral  Resp:    16  Height:      Weight:    112.4 kg (247 lb 12.8 oz)  SpO2:    98%   Exam: Alert, chronically ill No jvd Chest clear bilat RRR no RG Abd soft, obese, NTND L thigh AVG patent No LE edema Neuro left leg slightly weaker than R , no sensory deficit  HD: AF MTWFri 3.5h  2/3.5 Bath  116kg  Heparin 11,000   L thigh AVG Venofer 50/wk, no ESA        Assessment: 1. Bleeding L thigh AVG / ^WBC / septic picture - no gross AVG infection in OR, blood/ wound cx's neg; new medial limb was placed (old medial limb was not removed). On empiric abx with IV Vanc.  2. Rhabdomyolysis / ^LFT's - unclear cause, starting to improve 3. Chronic back pain / LE weakness / debility - similar to last admit. Severe spinal stenosis by repeat MRI, no discitis. 4. 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 CAD hx stent 6. ESRD on home HD- will do 3x/ week HD while in hospital. 4h tomorrow and Sat 7. Anemia on IV Fe, Hb 10 8. MBD cont meds   Plan - HD today off sched then Sat    Vinson Moselle MD  pager [phone]    cell [phone]  08/16/2014, 7:12 PM     Recent Labs Lab 08/14/14 0700 08/14/14 1220 08/16/14 0545  NA 134* 137 135  K 5.0 4.4 4.6  CL 94* 99 97  CO2 23 25 22   GLUCOSE 101* 82 70  BUN 67* 31* 49*  CREATININE 10.61* 6.00* 9.10*  CALCIUM 7.6* 9.2 7.6*  PHOS  --  4.5  --     Recent Labs Lab 08/13/14 0513 08/14/14 0700 08/14/14 1220 08/16/14 0545  AST 1608* 1774*  --  1498*  ALT 322* 318*  --  150*  ALKPHOS 195* 219*  --  150*  BILITOT 1.4* 1.1  --  0.7  PROT 7.2 6.8  --  6.6  ALBUMIN 2.1* 2.1* 2.3* 2.1*    Recent Labs Lab 08/10/14 1248  08/14/14 0400 08/14/14 1220 08/16/14 0545  WBC 14.8*  < > 14.1*  13.7* 18.6*  NEUTROABS 13.5*  --   --   --   --   HGB 12.1*  < > 10.6* 12.2* 10.5*  HCT 37.7*  < > 33.0* 36.8* 32.8*  MCV 95.7  < > 95.7 94.8 96.8  PLT 253  < > 288 271 282  < > = values in this interval not displayed. Marland Kitchen antiseptic oral rinse  7 mL Mouth Rinse BID  . aspirin EC  325 mg Oral Daily  . Chlorhexidine Gluconate Cloth  6 each Topical Q0600  . folic acid  1 mg Oral Daily  . heparin  5,000 Units Subcutaneous 3 times per day  . insulin aspart  0-9 Units Subcutaneous TID WC  . latanoprost  1 drop Both Eyes QHS  . oxyCODONE-acetaminophen      . thiamine  100 mg Oral Daily   . sodium chloride 10 mL/hr at 08/13/14 1033  . sodium chloride 10 mL/hr at 08/16/14 [phone]  fentaNYL (SUBLIMAZE) injection, HYDROmorphone (DILAUDID) injection, ondansetron **OR** ondansetron (ZOFRAN) IV, oxyCODONE-acetaminophen

## 2014-08-16 NOTE — Progress Notes (Signed)
Pt discussed with Dr Thedore Mins.  Recently replaced medial limb of graft for bleeding.  MRI today shows possible subcutaneous abscess left lateral thigh which may communicate with graft.  If graft in infected it will require removal.  Clinically the patient has had edema over the left thigh which is unchanged.  He has had progressive increase in WBC.  Segment of graft sent from the OR has been culture negative to date.  Dr Thedore Mins will discuss patient with ID. If they feel the source of infection is the thigh graft it will have to be removed.  Unfortunately this is the patient's last permanent access site which will make him catheter dependent.  We need to make sure that all possible sources of infection have been ruled out and all options have been exhausted with renal and ID service before we proceed with graft removal in this patient with limited access options.  If it is determined that graft needs removal, I am available to do this on Monday 08/20/14  I will follow up with patient tomorrow to weigh options.  Fabienne Bruns, MD [facility] Office: [phone] Pager: [phone]

## 2014-08-16 NOTE — Progress Notes (Addendum)
[facility] PROGRESS NOTE  [name] [name] DOB: July 14, 1955 DOA: 08/10/2014 PCP: Dyke Maes, MD    Interim summary:  45- year ol  Male with multiple medical  Problems including chronic diastolic heart failure, ESRD on HD 4 times a week, diet controlled DM, h/o hepatitis C, hypertension,  CAD s/p PCI, admitted for chest pain, back pain.  On admission he was found to be hypotensive and underwent HD on Friday 4 /14.     HPI/Subjective: Patient in bed, denies any headache, no chest pain or abdominal pain. No shortness of breath. No bleeding from the left thigh       Assessment/Plan:  Sepsis and bleeding from L thigh HD graft with Leukocytosis with hypotension:  Had elevated pro calcitonin , normal lactic acid level. Probably from sepsis from left thigh graft. His sepsis seems to be improving.  Vascular consulted Underwent revision of the left thigh AV graft and he underwent HD on 4/19. ID followin as well. On Vanco per ID.     MRI of the left thigh on  08/16/2014 noted with ? Abscess, Vascular surgery informed. We'll get a TTE, right upper quadrant ultrasound and a chest x-ray to rule out any other source of infection. ID on board discussed with Dr. Luciana Axe.     Transaminitis and rhabdomyolysis probably from myositis: Elevated liver enzymes likely due to combination of hypotension with shock liver, rhabdomyolysis being on high-dose statin. Seen by GI. Does have history of hep C and remote history of hep B. No further GI workup per gastroenterology besides outpatient follow-up for HCV. Trend of liver enzymes is improving   Rhabdomyolysis. Left thigh MRI under sedation ordered for 08/16/2014, was also on high-dose statin. Monitor trend of CK which is improving.   Hx of hepatitis C infection. Elevated viral load, will follow with GI outpatient along with ID.   Chest pain / abnormal EKG/ ELEVATED troponin's CAD s/p PCI: chest pain resolved. Underwent stress  myoview. Results are unremarkable. Cardiology signed off. Off Effient for the vascular procedure on 4/18.    ESRD ON  HD: Further management as per renal. HD 4 times a week. Renal on board.     Anemia:AOCD Mild and normocytic.    Diabetes Mellitus: CBG (last 3)   Recent Labs  08/15/14 2135 08/16/14 0725 08/16/14 1046  GLUCAP 121* 78 94   hgba1c is 5.4 and  Diet controlled DM.     Hypertension: controlled.    Chronic diastolic heart failure EF not mentioned on last echo : He appears to be compensated.     DVT prophylaxis  Heparin  Code Status: full code Family Communication: NONE at bedside Disposition Plan: pending further investigation..    Consultants:  Renal  Vascular  Cardiology   ID  GI  Procedures:  Stress myoview  HD  Revision of the left thigh medial graft  Anti-infectives    Start     Dose/Rate Route Frequency Ordered Stop   08/16/14 1200  vancomycin (VANCOCIN) IVPB 1000 mg/200 mL premix     1,000 mg 200 mL/hr over 60 Minutes Intravenous  Once 08/16/14 1145     08/14/14 0945  vancomycin (VANCOCIN) IVPB 1000 mg/200 mL premix     1,000 mg 200 mL/hr over 60 Minutes Intravenous  Once 08/14/14 0933 08/14/14 1306   08/11/14 2000  vancomycin (VANCOCIN) 2,000 mg in sodium chloride 0.9 % 500 mL IVPB     2,000 mg 250 mL/hr over 120 Minutes Intravenous  Once 08/11/14 1803  08/11/14 2343   08/10/14 1700  piperacillin-tazobactam (ZOSYN) IVPB 2.25 g  Status:  Discontinued     2.25 g 100 mL/hr over 30 Minutes Intravenous 3 times per day 08/10/14 1646 08/12/14 1054      Objective:  Filed Vitals:   08/16/14 0033 08/16/14 0500 08/16/14 1045 08/16/14 1112  BP: 153/71 138/69 107/58   Pulse: 74 70    Temp: 98 F (36.7 C) 97.7 F (36.5 C) 98.3 F (36.8 C) 98.1 F (36.7 C)  TempSrc: Oral Oral    Resp: 16 14    Height:      Weight:  113.671 kg (250 lb 9.6 oz)    SpO2: 100% 100%      Intake/Output Summary (Last 24 hours) at 08/16/14  1203 Last data filed at 08/16/14 1100  Gross per 24 hour  Intake    360 ml  Output      0 ml  Net    360 ml   Filed Weights   08/14/14 1106 08/15/14 0400 08/16/14 0500  Weight: 112.2 kg (247 lb 5.7 oz) 112.9 kg (248 lb 14.4 oz) 113.671 kg (250 lb 9.6 oz)    Exam:   General:  Alert not indistress  Cardiovascular: s1s2  Respiratory: diminished air entry at bases  Abdomen: soft non tender non distended bowel sounds heard  Musculoskeletal: pedal edema present  L thigh - incision site and a bandage, mild surrounding erythema and induration, no physical signs of compartment syndrome in the left thigh  Data Reviewed: Basic Metabolic Panel:  Recent Labs Lab 08/12/14 0400 08/13/14 0513 08/14/14 0700 08/14/14 1220 08/16/14 0545  NA 134* 135 134* 137 135  K 4.4 4.9 5.0 4.4 4.6  CL 93* 94* 94* 99 97  CO2 25 22 23 25 22   GLUCOSE 81 79 101* 82 70  BUN 46* 56* 67* 31* 49*  CREATININE 7.66* 9.04* 10.61* 6.00* 9.10*  CALCIUM 8.4 8.1* 7.6* 9.2 7.6*  PHOS  --   --   --  4.5  --    Liver Function Tests:  Recent Labs Lab 08/11/14 0710 08/12/14 0400 08/13/14 0513 08/14/14 0700 08/14/14 1220 08/16/14 0545  AST 1218* 1533* 1608* 1774*  --  1498*  ALT 234* 283* 322* 318*  --  150*  ALKPHOS 213* 203* 195* 219*  --  150*  BILITOT 2.1* 1.8* 1.4* 1.1  --  0.7  PROT 7.2 7.4 7.2 6.8  --  6.6  ALBUMIN 2.2* 2.1* 2.1* 2.1* 2.3* 2.1*    Recent Labs Lab 08/10/14 1830  LIPASE 31   No results for input(s): AMMONIA in the last 168 hours. CBC:  Recent Labs Lab 08/10/14 1248  08/12/14 0400 08/13/14 0513 08/14/14 0400 08/14/14 1220 08/16/14 0545  WBC 14.8*  < > 20.6* 16.5* 14.1* 13.7* 18.6*  NEUTROABS 13.5*  --   --   --   --   --   --   HGB 12.1*  < > 10.5* 10.5* 10.6* 12.2* 10.5*  HCT 37.7*  < > 33.5* 33.3* 33.0* 36.8* 32.8*  MCV 95.7  < > 96.5 96.2 95.7 94.8 96.8  PLT 253  < > 302 272 288 271 282  < > = values in this interval not displayed. Cardiac Enzymes:  Recent  Labs Lab 08/10/14 1437 08/10/14 1830 08/11/14 1026 08/11/14 1542 08/11/14 2104 08/14/14 0700 08/16/14 0545  CKTOTAL  --  11720* 14273*  --   --  16194* 9482*  TROPONINI 0.12*  --  1.98* 2.11* 2.37*  --   --  BNP (last 3 results) No results for input(s): BNP in the last 8760 hours.  ProBNP (last 3 results) No results for input(s): PROBNP in the last 8760 hours.  CBG:  Recent Labs Lab 08/15/14 1207 08/15/14 1633 08/15/14 2135 08/16/14 0725 08/16/14 1046  GLUCAP 99 98 121* 78 94    Recent Results (from the past 240 hour(s))  MRSA PCR Screening     Status: None   Collection Time: 08/10/14  5:33 PM  Result Value Ref Range Status   MRSA by PCR NEGATIVE NEGATIVE Final    Comment:        The GeneXpert MRSA Assay (FDA approved for NASAL specimens only), is one component of a comprehensive MRSA colonization surveillance program. It is not intended to diagnose MRSA infection nor to guide or monitor treatment for MRSA infections.   Culture, blood (routine x 2)     Status: None (Preliminary result)   Collection Time: 08/10/14  6:08 PM  Result Value Ref Range Status   Specimen Description BLOOD LEFT ANTECUBITAL  Final   Special Requests   Final    BOTTLES DRAWN AEROBIC AND ANAEROBIC 10 CC BLUE 8CC RED   Culture   Final           BLOOD CULTURE RECEIVED NO GROWTH TO DATE CULTURE WILL BE HELD FOR 5 DAYS BEFORE ISSUING A FINAL NEGATIVE REPORT Performed at [facility]    Report Status PENDING  Incomplete  Culture, blood (routine x 2)     Status: None (Preliminary result)   Collection Time: 08/10/14  6:18 PM  Result Value Ref Range Status   Specimen Description BLOOD RIGHT HAND  Final   Special Requests BOTTLES DRAWN AEROBIC ONLY 8 CC  Final   Culture   Final           BLOOD CULTURE RECEIVED NO GROWTH TO DATE CULTURE WILL BE HELD FOR 5 DAYS BEFORE ISSUING A FINAL NEGATIVE REPORT Performed at [facility]    Report Status PENDING  Incomplete   Clostridium Difficile by PCR     Status: None   Collection Time: 08/13/14  1:35 AM  Result Value Ref Range Status   C difficile by pcr NEGATIVE NEGATIVE Final  Anaerobic culture     Status: None (Preliminary result)   Collection Time: 08/13/14 12:46 PM  Result Value Ref Range Status   Specimen Description WOUND LEFT GRAFT  Final   Special Requests POF ANCEF  Final   Gram Stain   Final    NO WBC SEEN NO SQUAMOUS EPITHELIAL CELLS SEEN NO ORGANISMS SEEN Performed at [facility]    Culture   Final    NO ANAEROBES ISOLATED; CULTURE IN PROGRESS FOR 5 DAYS Performed at [facility]    Report Status PENDING  Incomplete  Wound culture     Status: None   Collection Time: 08/13/14 12:46 PM  Result Value Ref Range Status   Specimen Description WOUND LEFT GRAFT  Final   Special Requests POF ANCEF  Final   Gram Stain   Final    NO WBC SEEN NO SQUAMOUS EPITHELIAL CELLS SEEN NO ORGANISMS SEEN Performed at [facility]    Culture   Final    NO GROWTH 2 DAYS Performed at [facility]    Report Status 08/15/2014 FINAL  Final     Studies: Mr Lumbar Spine Wo Contrast  08/16/2014   CLINICAL DATA:  Dialysis patient with history of hepatitis. Severe back pain.  Wound of the left thigh. Question diskitis.  EXAM: MRI LUMBAR SPINE WITHOUT CONTRAST  TECHNIQUE: Multiplanar, multisequence MR imaging of the lumbar spine was performed. No intravenous contrast was administered.  COMPARISON:  MRI lumbar spine 06/27/2014 and 11/06/2011. CT abdomen and pelvis 08/12/2014.  FINDINGS: Bone marrow signal is markedly heterogeneous as seen on the prior examination. Scattered Schmorl's nodes are identified. No fluid within intervertebral disc spaces or marrow signal abnormality to suggest discitis or osteomyelitis is identified. The patient has a congenitally narrow central spinal canal. No fracture is identified. Trace anterolisthesis L3 on L4 is unchanged. Alignment is otherwise  normal. Imaged intra-abdominal contents again demonstrate marked renal atrophy and extensive cyst formation.  The T11-12 and T12-L1 levels are imaged in the sagittal plane only and negative.  L1-2:  Negative.  L2-3: Shallow disc bulge is unchanged. The central canal and foramina appear open.  L3-4: Broad-based disc bulge, ligamentum flavum thickening and facet arthropathy cause severe central canal stenosis. Disc extends into the foramina causing severe bilateral foraminal narrowing. The appearance is not markedly changed since the most recent exam but worsened since the 2013 study.  L4-5: There is a shallow central protrusion and mild facet degenerative disease. The central canal and foramina are mildly narrowed.  L5-S1: Shallow disc bulge without central canal stenosis. The foramina are mildly narrowed.  IMPRESSION: Negative for discitis or osteomyelitis.  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 The appearance is not markedly changed since the most recent exam.   Electronically Signed   By: Drusilla Kanner M.D.   On: 08/16/2014 11:10   Mr Femur Left Wo Contrast  08/16/2014   CLINICAL DATA:  End-stage renal disease. Hepatitis-C. Total hip arthroplasty. Drainage from the thigh. Deep infection of the LEFT thigh.  EXAM: MR OF THE LEFT FEMUR WITHOUT CONTRAST  TECHNIQUE: Multiplanar, multisequence MR imaging was performed. No intravenous contrast was administered.  COMPARISON:  CT 08/12/2014.  Radiographs 10/12/2013.  FINDINGS: There is a large amount of susceptibility artifact in the proximal LEFT femoral diaphysis associated with metallic foreign body identified on prior radiographs. There is no LEFT total hip arthroplasty. Severe LEFT hip osteoarthritis is present. Small LEFT hip effusion and degenerative synovitis. No convincing evidence of septic arthritis. No decubitus ulcers around the LEFT hip.  LEFT thigh AV fistula is present.  Cystic lesion is present in the LEFT inguinal region measuring 40 mm x 20 mm on axial imaging. This probably represents a lymphocele or seroma. Ganglion cyst is unlikely given the location. Abscess is also unlikely given the lack of adjacent inflammatory change. Mild LEFT inguinal adenopathy is present.  Myositis is present in the semi tendinosis and biceps femoris of the LEFT thigh. Circumferential edema is present in the proximal LEFT thigh which is nonspecific but commonly associated with cellulitis. Superficial subcutaneous fluid is present along the lateral aspect of the LEFT thigh. This superficial subcutaneous fluid collection measures 5 cm AP, 9 mm thickest in the transverse dimension and 7.5 cm craniocaudal. Infiltrating subcutaneous edema is present more proximally. This subcutaneous lesion may represent a small abscess in a patient with a history of sepsis. The fluid overlies the midportion of the vastus lateralis muscle. No deep soft tissue infection is identified.  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 This has a slightly different distribution than in the contralateral LEFT side. No my necrosis or deep soft tissue abscess in the incidentally visualized LEFT thigh.  ORIF of the  RIGHT proximal femur is present with cannulated lag screws producing susceptibility artifact. 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  IMPRESSION: 1. Severe LEFT hip osteoarthritis. Cannulated lag screws in the RIGHT hip. 2. Susceptibility artifact associated with metallic foreign body in the proximal LEFT femoral diaphysis degrading the evaluation of the deep soft tissues in this region. 3. No deep soft tissue abscess. 4. Small thin lateral LEFT thigh subcutaneous fluid collection measuring only about 1 cm in thickness overlies the vastus lateralis. This is nonspecific but could represent a small abscess. 5. Diffuse subcutaneous edema of the LEFT thigh, which may represent cellulitis in the appropriate  clinical setting. 6. Nonspecific myositis of the hamstrings bilaterally. In the setting of prior sepsis, this is most likely infectious.   Electronically Signed   By: Andreas Newport M.D.   On: 08/16/2014 11:15    Scheduled Meds: . antiseptic oral rinse  7 mL Mouth Rinse BID  . aspirin EC  325 mg Oral Daily  . Chlorhexidine Gluconate Cloth  6 each Topical Q0600  . folic acid  1 mg Oral Daily  . heparin  5,000 Units Subcutaneous 3 times per day  . insulin aspart  0-9 Units Subcutaneous TID WC  . latanoprost  1 drop Both Eyes QHS  . thiamine  100 mg Oral Daily  . vancomycin  1,000 mg Intravenous Once   Continuous Infusions: . sodium chloride 10 mL/hr at 08/13/14 1033  . sodium chloride 10 mL/hr at 08/16/14 1610    Principal Problem:   Sepsis associated hypotension Active Problems:   End-stage renal disease on hemodialysis   History of placement of stent in LAD coronary artery   HTN (hypertension)   Radicular leg pain   Right-sided low back pain with right-sided sciatica   Elevated troponin   Abnormal transaminases   Metabolic encephalopathy   Chest pain   Shock circulatory   Pain in the chest   Weakness   Abnormal EKG   Abnormal liver function test   Lumbago   Diabetic foot ulcer   Myositis   NSTEMI (non-ST elevated myocardial infarction)   Thigh pain   Infection and inflammatory reaction due to cardiac device, implant, and graft   Back pain    Time spent: 25 minutes    SINGH,PRASHANT K  [facility] Pager [phone] If 7PM-7AM, please contact night-coverage at [email or web address], password [email or web address] 08/16/2014, 12:03 PM  LOS: 6 days

## 2014-08-17 ENCOUNTER — Inpatient Hospital Stay (HOSPITAL_COMMUNITY): Payer: Medicare Other

## 2014-08-17 ENCOUNTER — Encounter (HOSPITAL_COMMUNITY): Payer: Self-pay | Admitting: Radiology

## 2014-08-17 DIAGNOSIS — R509 Fever, unspecified: Secondary | ICD-10-CM

## 2014-08-17 DIAGNOSIS — L02416 Cutaneous abscess of left lower limb: Secondary | ICD-10-CM

## 2014-08-17 LAB — CULTURE, BLOOD (ROUTINE X 2)
Culture: NO GROWTH
Culture: NO GROWTH

## 2014-08-17 LAB — GLUCOSE, CAPILLARY
GLUCOSE-CAPILLARY: 84 mg/dL (ref 70–99)
Glucose-Capillary: 101 mg/dL — ABNORMAL HIGH (ref 70–99)
Glucose-Capillary: 90 mg/dL (ref 70–99)
Glucose-Capillary: 83 mg/dL (ref 70–99)

## 2014-08-17 LAB — CBC
HEMATOCRIT: 30.2 % — AB (ref 39.0–52.0)
HEMOGLOBIN: 9.5 g/dL — AB (ref 13.0–17.0)
MCH: 30.8 pg (ref 26.0–34.0)
MCHC: 31.5 g/dL (ref 30.0–36.0)
MCV: 98.1 fL (ref 78.0–100.0)
Platelets: 259 10*3/uL (ref 150–400)
RBC: 3.08 MIL/uL — ABNORMAL LOW (ref 4.22–5.81)
RDW: 18.2 % — ABNORMAL HIGH (ref 11.5–15.5)
WBC: 14 10*3/uL — ABNORMAL HIGH (ref 4.0–10.5)

## 2014-08-17 LAB — COMPREHENSIVE METABOLIC PANEL
ALK PHOS: 134 U/L — AB (ref 39–117)
ALT: 92 U/L — AB (ref 0–53)
AST: 1128 U/L — AB (ref 0–37)
Albumin: 2.1 g/dL — ABNORMAL LOW (ref 3.5–5.2)
Anion gap: 12 (ref 5–15)
BUN: 31 mg/dL — ABNORMAL HIGH (ref 6–23)
CHLORIDE: 98 mmol/L (ref 96–112)
CO2: 26 mmol/L (ref 19–32)
Calcium: 7.6 mg/dL — ABNORMAL LOW (ref 8.4–10.5)
Creatinine, Ser: 7.12 mg/dL — ABNORMAL HIGH (ref 0.50–1.35)
GFR calc Af Amer: 9 mL/min — ABNORMAL LOW (ref >90)
GFR calc non Af Amer: 7 mL/min — ABNORMAL LOW (ref >90)
Glucose, Bld: 85 mg/dL (ref 70–99)
Potassium: 4 mmol/L (ref 3.5–5.1)
SODIUM: 136 mmol/L (ref 135–145)
Total Bilirubin: 0.7 mg/dL (ref 0.3–1.2)
Total Protein: 6.8 g/dL (ref 6.0–8.3)

## 2014-08-17 LAB — CK: Total CK: 6751 U/L — ABNORMAL HIGH (ref 7–232)

## 2014-08-17 MED ORDER — RENA-VITE PO TABS
1.0000 | ORAL_TABLET | Freq: Every day | ORAL | Status: DC
  Administered 2014-08-17 – 2014-08-22 (×6): 1 via ORAL
  Filled 2014-08-17 (×6): qty 1

## 2014-08-17 MED ORDER — VANCOMYCIN HCL IN DEXTROSE 1-5 GM/200ML-% IV SOLN
1000.0000 mg | INTRAVENOUS | Status: DC
  Administered 2014-08-18: 1000 mg via INTRAVENOUS
  Filled 2014-08-17: qty 200

## 2014-08-17 MED ORDER — MORPHINE SULFATE ER 15 MG PO TBCR
30.0000 mg | EXTENDED_RELEASE_TABLET | Freq: Two times a day (BID) | ORAL | Status: DC
  Administered 2014-08-17 – 2014-08-23 (×13): 30 mg via ORAL
  Filled 2014-08-17 (×13): qty 2

## 2014-08-17 NOTE — Progress Notes (Signed)
  Echocardiogram 2D Echocardiogram has been performed.  Arvil Chaco 08/17/2014, 1:40 PM

## 2014-08-17 NOTE — Evaluation (Signed)
Physical Therapy Evaluation Patient Details Name: [name] MRN: [id number] DOB: 09-16-1955 Today's Date: 08/17/2014   History of Present Illness  Pt adm with sepsis and bleeding from lt thigh graft. Work-up for possible graft infection in progress. PMH - severe chronic back pain, ESRD on HD, HTN, DM  Clinical Impression  Pt admitted with above diagnosis. Pt currently with functional limitations due to the deficits listed below (see PT Problem List).  Pt will benefit from skilled PT to increase their independence and safety with mobility to allow discharge to the venue listed below.  Agree pt should return to [facility] for more rehab.     Follow Up Recommendations SNF    Equipment Recommendations  None recommended by PT    Recommendations for Other Services       Precautions / Restrictions Precautions Precautions: Fall      Mobility  Bed Mobility Overal bed mobility: Needs Assistance Bed Mobility: Sidelying to Sit   Sidelying to sit: Mod assist       General bed mobility comments: Assist to elevate trunk and bring hips to EOB.  Transfers Overall transfer level: Needs assistance Equipment used: Ambulation equipment used Antony Salmon) Transfers: Sit to/from Stand Sit to Stand: Mod assist;From elevated surface         General transfer comment: Assist to bring hips up from elevated bed. Pt with flexed trunk and propping forearms on Stedy. Only able to maintain x 5-10 sec  Ambulation/Gait                Stairs            Wheelchair Mobility    Modified Rankin (Stroke Patients Only)       Balance Overall balance assessment: Needs assistance Sitting-balance support: No upper extremity supported;Feet supported Sitting balance-Leahy Scale: Good     Standing balance support: Bilateral upper extremity supported Standing balance-Leahy Scale: Poor Standing balance comment: Stedy and mod A for brief stand.                              Pertinent Vitals/Pain Pain Assessment: 0-10 Pain Score: 8  Pain Location: back Pain Intervention(s): Limited activity within patient's tolerance;Monitored during session;Repositioned;Patient requesting pain meds-RN notified    Home Living Family/patient expects to be discharged to:: Skilled nursing facility                      Prior Function Level of Independence: Needs assistance   Gait / Transfers Assistance Needed: Was amb with therapy at SNF.           Hand Dominance        Extremity/Trunk Assessment   Upper Extremity Assessment: Generalized weakness           Lower Extremity Assessment: Generalized weakness         Communication      Cognition Arousal/Alertness: Awake/alert Behavior During Therapy: WFL for tasks assessed/performed Overall Cognitive Status: Within Functional Limits for tasks assessed                      General Comments      Exercises        Assessment/Plan    PT Assessment Patient needs continued PT services  PT Diagnosis Difficulty walking;Generalized weakness;Other (comment) (chronic pain)   PT Problem List Decreased strength;Decreased activity tolerance;Decreased balance;Decreased mobility;Decreased knowledge of use of DME;Obesity;Pain  PT Treatment Interventions DME instruction;Balance training;Gait  training;Functional mobility training;Patient/family education;Therapeutic activities;Therapeutic exercise   PT Goals (Current goals can be found in the Care Plan section) Acute Rehab PT Goals Patient Stated Goal: eventually return home PT Goal Formulation: With patient Time For Goal Achievement: 08/31/14 Potential to Achieve Goals: Good    Frequency Min 2X/week   Barriers to discharge        Co-evaluation               End of Session Equipment Utilized During Treatment: Gait belt;Other (comment) Antony Salmon) Activity Tolerance: Patient limited by fatigue;Patient limited by pain Patient left: in  bed;with nursing/sitter in room;with bed alarm set (sitting EOB) Nurse Communication: Mobility status;Need for lift equipment         Time: [id number] PT Time Calculation (min) (ACUTE ONLY): 21 min   Charges:   PT Evaluation $Initial PT Evaluation Tier I: 1 Procedure     PT G Codes:        Lorimer Tiberio 2014-09-08, 10:44 AM  Skip Mayer PT [phone]

## 2014-08-17 NOTE — Progress Notes (Signed)
Subjective: Interval History: has complaints still some back pain.  Objective: Vital signs in last 24 hours: Temp:  [97.8 F (36.6 C)-98.3 F (36.8 C)] 97.8 F (36.6 C) (04/22 0500) Pulse Rate:  [72-84] 74 (04/22 0500) Resp:  [11-22] 22 (04/22 0500) BP: (92-148)/(33-120) 113/83 mmHg (04/22 0500) SpO2:  [96 %-100 %] 100 % (04/22 0500) Weight:  [112.4 kg (247 lb 12.8 oz)-114.579 kg (252 lb 9.6 oz)] 114.579 kg (252 lb 9.6 oz) (04/22 0500) Weight change: 0.729 kg (1 lb 9.7 oz)  Intake/Output from previous day: 04/21 0701 - 04/22 0700 In: 240 [P.O.:240] Out: 2000  Intake/Output this shift:    General appearance: alert, cooperative and morbidly obese Resp: diminished breath sounds bilaterally Cardio: regular rate and rhythm and systolic murmur: holosystolic 2/6, blowing at apex GI: obese, pos bs, liver down 6 cm Extremities: AVG L groin some induration.  mildly warm, bandages lat  Lab Results:  Recent Labs  08/16/14 0545 08/17/14 0615  WBC 18.6* 14.0*  HGB 10.5* 9.5*  HCT 32.8* 30.2*  PLT 282 259   BMET:  Recent Labs  08/16/14 0545 08/17/14 0615  NA 135 136  K 4.6 4.0  CL 97 98  CO2 22 26  GLUCOSE 70 85  BUN 49* 31*  CREATININE 9.10* 7.12*  CALCIUM 7.6* 7.6*   No results for input(s): PTH in the last 72 hours. Iron Studies: No results for input(s): IRON, TIBC, TRANSFERRIN, FERRITIN in the last 72 hours.  Studies/Results: Mr Lumbar Spine Wo Contrast  08/16/2014   CLINICAL DATA:  Dialysis patient with history of hepatitis. Severe back pain. Wound of the left thigh. Question diskitis.  EXAM: MRI LUMBAR SPINE WITHOUT CONTRAST  TECHNIQUE: Multiplanar, multisequence MR imaging of the lumbar spine was performed. No intravenous contrast was administered.  COMPARISON:  MRI lumbar spine 06/27/2014 and 11/06/2011. CT abdomen and pelvis 08/12/2014.  FINDINGS: Bone marrow signal is markedly heterogeneous as seen on the prior examination. Scattered Schmorl's nodes are  identified. No fluid within intervertebral disc spaces or marrow signal abnormality to suggest discitis or osteomyelitis is identified. The patient has a congenitally narrow central spinal canal. No fracture is identified. Trace anterolisthesis L3 on L4 is unchanged. Alignment is otherwise normal. Imaged intra-abdominal contents again demonstrate marked renal atrophy and extensive cyst formation.  The T11-12 and T12-L1 levels are imaged in the sagittal plane only and negative.  L1-2:  Negative.  L2-3: Shallow disc bulge is unchanged. The central canal and foramina appear open.  L3-4: Broad-based disc bulge, ligamentum flavum thickening and facet arthropathy cause severe central canal stenosis. Disc extends into the foramina causing severe bilateral foraminal narrowing. The appearance is not markedly changed since the most recent exam but worsened since the 2013 study.  L4-5: There is a shallow central protrusion and mild facet degenerative disease. The central canal and foramina are mildly narrowed.  L5-S1: Shallow disc bulge without central canal stenosis. The foramina are mildly narrowed.  IMPRESSION: Negative for discitis or osteomyelitis.  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 The appearance is not markedly changed since the most recent exam.   Electronically Signed   By: Drusilla Kanner M.D.   On: 08/16/2014 11:10   Mr Femur Left Wo Contrast  08/16/2014   CLINICAL DATA:  End-stage renal disease. Hepatitis-C. Total hip arthroplasty. Drainage from the thigh. Deep infection of the LEFT thigh.  EXAM: MR OF THE LEFT FEMUR WITHOUT CONTRAST  TECHNIQUE: Multiplanar, multisequence  MR imaging was performed. No intravenous contrast was administered.  COMPARISON:  CT 08/12/2014.  Radiographs 10/12/2013.  FINDINGS: There is a large amount of susceptibility artifact in the proximal LEFT femoral diaphysis associated with metallic foreign  body identified on prior radiographs. There is no LEFT total hip arthroplasty. Severe LEFT hip osteoarthritis is present. Small LEFT hip effusion and degenerative synovitis. No convincing evidence of septic arthritis. No decubitus ulcers around the LEFT hip.  LEFT thigh AV fistula is present. Cystic lesion is present in the LEFT inguinal region measuring 40 mm x 20 mm on axial imaging. This probably represents a lymphocele or seroma. Ganglion cyst is unlikely given the location. Abscess is also unlikely given the lack of adjacent inflammatory change. Mild LEFT inguinal adenopathy is present.  Myositis is present in the semi tendinosis and biceps femoris of the LEFT thigh. Circumferential edema is present in the proximal LEFT thigh which is nonspecific but commonly associated with cellulitis. Superficial subcutaneous fluid is present along the lateral aspect of the LEFT thigh. This superficial subcutaneous fluid collection measures 5 cm AP, 9 mm thickest in the transverse dimension and 7.5 cm craniocaudal. Infiltrating subcutaneous edema is present more proximally. This subcutaneous lesion may represent a small abscess in a patient with a history of sepsis. The fluid overlies the midportion of the vastus lateralis muscle. No deep soft tissue infection is identified.  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 This has a slightly different distribution than in the contralateral LEFT side. No my necrosis or deep soft tissue abscess in the incidentally visualized LEFT thigh.  ORIF of the RIGHT proximal femur is present with cannulated lag screws producing susceptibility artifact. 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  IMPRESSION: 1. Severe LEFT hip osteoarthritis. Cannulated lag screws in the RIGHT hip. 2. Susceptibility artifact associated with metallic foreign body in the proximal LEFT femoral diaphysis degrading the evaluation of the deep soft tissues in this region. 3. No deep soft tissue abscess.  4. Small thin lateral LEFT thigh subcutaneous fluid collection measuring only about 1 cm in thickness overlies the vastus lateralis. This is nonspecific but could represent a small abscess. 5. Diffuse subcutaneous edema of the LEFT thigh, which may represent cellulitis in the appropriate clinical setting. 6. Nonspecific myositis of the hamstrings bilaterally. In the setting of prior sepsis, this is most likely infectious.   Electronically Signed   By: Andreas Newport M.D.   On: 08/16/2014 11:15   Dg Chest Port 1 View  08/16/2014   CLINICAL DATA:  Shortness of breath for 1 day.  EXAM: PORTABLE CHEST - 1 VIEW  COMPARISON:  08/10/2014.  FINDINGS: Stable enlarged cardiac silhouette. Interval mild prominence of the pulmonary vasculature. Stable mildly prominent interstitial markings. No pleural fluid. Thoracic spine degenerative changes. Bilateral neck surgical clips.  IMPRESSION: 1. Interval mild pulmonary vascular congestion with stable cardiomegaly. 2. Stable mild chronic interstitial lung disease.   Electronically Signed   By: Beckie Salts M.D.   On: 08/16/2014 20:15   US Abdomen Limited Ruq  08/17/2014   CLINICAL DATA:  Fever, increased WBC, increased LFT  EXAM: US ABDOMEN LIMITED - RIGHT UPPER QUADRANT  COMPARISON:  None.  FINDINGS: Gallbladder:  Multiple gallstones are noted within gallbladder largest measures 6 mm. No thickening of gallbladder wall. No sonographic Murphy's sign  Common bile duct:  Diameter: 5.6 mm in diameter within normal limits  Liver:  No focal lesion identified. Within normal limits in parenchymal echogenicity.  IMPRESSION: 1. Multiple  gallstones are noted within gallbladder. 2. No thickening of gallbladder wall.  Normal CBD.   Electronically Signed   By: Natasha Mead M.D.   On: 08/17/2014 08:03    I have reviewed the patient's current medications.  Assessment/Plan: 1 ESRD for HD in am 2 AVG on AB, WBC falling.  Do not want to take out AVG unless a must as his risk is extremely  high with Catheter 3 Obesity 4 LBP repeat of chronic issue, last hosp the same 5 CAD 6 Anemia 7 HPTH P HD, AB, mobilize     LOS: 7 days   Airik Goodlin L 08/17/2014,9:34 AM

## 2014-08-17 NOTE — Progress Notes (Signed)
[facility]  Date of Admission:  08/10/2014  Antibiotics: vancomycin  Subjective: Some discomfort in leg  Objective: Temp:  [97.8 F (36.6 C)-98.3 F (36.8 C)] 97.8 F (36.6 C) (04/22 0500) Pulse Rate:  [72-84] 74 (04/22 0500) Resp:  [11-22] 22 (04/22 0500) BP: (92-148)/(33-120) 113/83 mmHg (04/22 0500) SpO2:  [96 %-100 %] 100 % (04/22 0500) Weight:  [247 lb 12.8 oz (112.4 kg)-252 lb 9.6 oz (114.579 kg)] 252 lb 9.6 oz (114.579 kg) (04/22 0500)  General: awake, alert, nad Skin: no rashes Lungs: CTA B Cor: RRR Ext: left leg with some drainage including blood and small amount of pus.  Some induration in the area  Lab Results Lab Results  Component Value Date   WBC 14.0* 08/17/2014   HGB 9.5* 08/17/2014   HCT 30.2* 08/17/2014   MCV 98.1 08/17/2014   PLT 259 08/17/2014    Lab Results  Component Value Date   CREATININE 7.12* 08/17/2014   BUN 31* 08/17/2014   NA 136 08/17/2014   K 4.0 08/17/2014   CL 98 08/17/2014   CO2 26 08/17/2014    Lab Results  Component Value Date   ALT 92* 08/17/2014   AST 1128* 08/17/2014   ALKPHOS 134* 08/17/2014   BILITOT 0.7 08/17/2014      Microbiology: Recent Results (from the past 240 hour(s))  MRSA PCR Screening     Status: None   Collection Time: 08/10/14  5:33 PM  Result Value Ref Range Status   MRSA by PCR NEGATIVE NEGATIVE Final    Comment:        The GeneXpert MRSA Assay (FDA approved for NASAL specimens only), is one component of a comprehensive MRSA colonization surveillance program. It is not intended to diagnose MRSA infection nor to guide or monitor treatment for MRSA infections.   Culture, blood (routine x 2)     Status: None   Collection Time: 08/10/14  6:08 PM  Result Value Ref Range Status   Specimen Description BLOOD LEFT ANTECUBITAL  Final   Special Requests   Final    BOTTLES DRAWN AEROBIC AND ANAEROBIC 10 CC BLUE 8CC RED   Culture   Final    NO GROWTH 5 DAYS Performed  at [facility]    Report Status 08/17/2014 FINAL  Final  Culture, blood (routine x 2)     Status: None   Collection Time: 08/10/14  6:18 PM  Result Value Ref Range Status   Specimen Description BLOOD RIGHT HAND  Final   Special Requests BOTTLES DRAWN AEROBIC ONLY 8 CC  Final   Culture   Final    NO GROWTH 5 DAYS Performed at [facility]    Report Status 08/17/2014 FINAL  Final  Clostridium Difficile by PCR     Status: None   Collection Time: 08/13/14  1:35 AM  Result Value Ref Range Status   C difficile by pcr NEGATIVE NEGATIVE Final  Anaerobic culture     Status: None (Preliminary result)   Collection Time: 08/13/14 12:46 PM  Result Value Ref Range Status   Specimen Description WOUND LEFT GRAFT  Final   Special Requests POF ANCEF  Final   Gram Stain   Final    NO WBC SEEN NO SQUAMOUS EPITHELIAL CELLS SEEN NO ORGANISMS SEEN Performed at [facility]    Culture   Final    NO ANAEROBES ISOLATED; CULTURE IN PROGRESS FOR 5 DAYS Performed at [facility]  Report Status PENDING  Incomplete  Wound culture     Status: None   Collection Time: 08/13/14 12:46 PM  Result Value Ref Range Status   Specimen Description WOUND LEFT GRAFT  Final   Special Requests POF ANCEF  Final   Gram Stain   Final    NO WBC SEEN NO SQUAMOUS EPITHELIAL CELLS SEEN NO ORGANISMS SEEN Performed at [facility]    Culture   Final    NO GROWTH 2 DAYS Performed at [facility]    Report Status 08/15/2014 FINAL  Final    Studies/Results: Mr Lumbar Spine Wo Contrast  08/16/2014   CLINICAL DATA:  Dialysis patient with history of hepatitis. Severe back pain. Wound of the left thigh. Question diskitis.  EXAM: MRI LUMBAR SPINE WITHOUT CONTRAST  TECHNIQUE: Multiplanar, multisequence MR imaging of the lumbar spine was performed. No intravenous contrast was administered.  COMPARISON:  MRI lumbar spine 06/27/2014 and 11/06/2011. CT abdomen and pelvis  08/12/2014.  FINDINGS: Bone marrow signal is markedly heterogeneous as seen on the prior examination. Scattered Schmorl's nodes are identified. No fluid within intervertebral disc spaces or marrow signal abnormality to suggest discitis or osteomyelitis is identified. The patient has a congenitally narrow central spinal canal. No fracture is identified. Trace anterolisthesis L3 on L4 is unchanged. Alignment is otherwise normal. Imaged intra-abdominal contents again demonstrate marked renal atrophy and extensive cyst formation.  The T11-12 and T12-L1 levels are imaged in the sagittal plane only and negative.  L1-2:  Negative.  L2-3: Shallow disc bulge is unchanged. The central canal and foramina appear open.  L3-4: Broad-based disc bulge, ligamentum flavum thickening and facet arthropathy cause severe central canal stenosis. Disc extends into the foramina causing severe bilateral foraminal narrowing. The appearance is not markedly changed since the most recent exam but worsened since the 2013 study.  L4-5: There is a shallow central protrusion and mild facet degenerative disease. The central canal and foramina are mildly narrowed.  L5-S1: Shallow disc bulge without central canal stenosis. The foramina are mildly narrowed.  IMPRESSION: Negative for discitis or osteomyelitis.  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 The appearance is not markedly changed since the most recent exam.   Electronically Signed   By: Drusilla Kanner M.D.   On: 08/16/2014 11:10   Mr Femur Left Wo Contrast  08/16/2014   CLINICAL DATA:  End-stage renal disease. Hepatitis-C. Total hip arthroplasty. Drainage from the thigh. Deep infection of the LEFT thigh.  EXAM: MR OF THE LEFT FEMUR WITHOUT CONTRAST  TECHNIQUE: Multiplanar, multisequence MR imaging was performed. No intravenous contrast was administered.  COMPARISON:  CT 08/12/2014.  Radiographs 10/12/2013.   FINDINGS: There is a large amount of susceptibility artifact in the proximal LEFT femoral diaphysis associated with metallic foreign body identified on prior radiographs. There is no LEFT total hip arthroplasty. Severe LEFT hip osteoarthritis is present. Small LEFT hip effusion and degenerative synovitis. No convincing evidence of septic arthritis. No decubitus ulcers around the LEFT hip.  LEFT thigh AV fistula is present. Cystic lesion is present in the LEFT inguinal region measuring 40 mm x 20 mm on axial imaging. This probably represents a lymphocele or seroma. Ganglion cyst is unlikely given the location. Abscess is also unlikely given the lack of adjacent inflammatory change. Mild LEFT inguinal adenopathy is present.  Myositis is present in the semi tendinosis and biceps femoris of the LEFT thigh. Circumferential edema is present in  the proximal LEFT thigh which is nonspecific but commonly associated with cellulitis. Superficial subcutaneous fluid is present along the lateral aspect of the LEFT thigh. This superficial subcutaneous fluid collection measures 5 cm AP, 9 mm thickest in the transverse dimension and 7.5 cm craniocaudal. Infiltrating subcutaneous edema is present more proximally. This subcutaneous lesion may represent a small abscess in a patient with a history of sepsis. The fluid overlies the midportion of the vastus lateralis muscle. No deep soft tissue infection is identified.  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 This has a slightly different distribution than in the contralateral LEFT side. No my necrosis or deep soft tissue abscess in the incidentally visualized LEFT thigh.  ORIF of the RIGHT proximal femur is present with cannulated lag screws producing susceptibility artifact. 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  IMPRESSION: 1. Severe LEFT hip osteoarthritis. Cannulated lag screws in the RIGHT hip. 2. Susceptibility artifact associated with metallic foreign body in  the proximal LEFT femoral diaphysis degrading the evaluation of the deep soft tissues in this region. 3. No deep soft tissue abscess. 4. Small thin lateral LEFT thigh subcutaneous fluid collection measuring only about 1 cm in thickness overlies the vastus lateralis. This is nonspecific but could represent a small abscess. 5. Diffuse subcutaneous edema of the LEFT thigh, which may represent cellulitis in the appropriate clinical setting. 6. Nonspecific myositis of the hamstrings bilaterally. In the setting of prior sepsis, this is most likely infectious.   Electronically Signed   By: Andreas Newport M.D.   On: 08/16/2014 11:15   Dg Chest Port 1 View  08/16/2014   CLINICAL DATA:  Shortness of breath for 1 day.  EXAM: PORTABLE CHEST - 1 VIEW  COMPARISON:  08/10/2014.  FINDINGS: Stable enlarged cardiac silhouette. Interval mild prominence of the pulmonary vasculature. Stable mildly prominent interstitial markings. No pleural fluid. Thoracic spine degenerative changes. Bilateral neck surgical clips.  IMPRESSION: 1. Interval mild pulmonary vascular congestion with stable cardiomegaly. 2. Stable mild chronic interstitial lung disease.   Electronically Signed   By: Beckie Salts M.D.   On: 08/16/2014 20:15   US Abdomen Limited Ruq  08/17/2014   CLINICAL DATA:  Fever, increased WBC, increased LFT  EXAM: US ABDOMEN LIMITED - RIGHT UPPER QUADRANT  COMPARISON:  None.  FINDINGS: Gallbladder:  Multiple gallstones are noted within gallbladder largest measures 6 mm. No thickening of gallbladder wall. No sonographic Murphy's sign  Common bile duct:  Diameter: 5.6 mm in diameter within normal limits  Liver:  No focal lesion identified. Within normal limits in parenchymal echogenicity.  IMPRESSION: 1. Multiple gallstones are noted within gallbladder. 2. No thickening of gallbladder wall.  Normal CBD.   Electronically Signed   By: Natasha Mead M.D.   On: 08/17/2014 08:03    Assessment/Plan:  1) abscess in thigh - there is some  pus coming out, seems superficial.  Could this be I and D'ed at bedside to help drain? - I would continue with vancomycin for 4 weeks to assure clearance - need to keep fistula as last access noted - WBC improving, CK improving - ultrasound with stones but no acute chole - CXR unremarkable  I will follow up on Monday if he is here and am available over the weekend if needed  Staci Righter, MD [facility] [email or web address] [phone] pager   [phone] cell 08/17/2014, 10:49 AM

## 2014-08-17 NOTE — Progress Notes (Addendum)
[facility] [place]  Subjective  - Patient states he is doing fine over all.   Objective 113/83 74 97.8 F (36.6 C) (Oral) 22 100%  Intake/Output Summary (Last 24 hours) at 08/17/14 6294 Last data filed at 08/16/14 2000  Gross per 24 hour  Intake    240 ml  Output   2000 ml  Net  -1760 ml    Left thigh incisions clean and dry healing well   Assessment/Planning: POD #4 Revision of medial graft left LE  His WBC has decreased from 18.6 to 14.0 and he is afebrile today. He is tolerating HD well using the lateral stick sites only Per Dr. Darrick Penna note yesterday We need to make sure that all possible sources of infection have been ruled out and all options have been exhausted with renal and ID service before we proceed with graft removal in this patient with limited access options.   Cultures of wound/graft and blood cultures negative to date.  Thomasena Edis, EMMA [facility] 08/17/2014 9:22 AM --  Small amount of drainage from apex incision and from prior area of bleeding graft that was defuntionalized.  No erythema or fluctuance.  WBC 19-14. Will have my partner Dr Imogene Burn follow up with him over the weekend.  If consensus of primary service, nephrology and ID is infected graft will need to decide whether to attempt to sterilize graft with IV antibiotics or remove.  No options for bedside I and D as this will leave [place] with exposed graft and force the issue.  Fabienne Bruns, MD [facility] [place] Office: [phone] Pager: [phone]   Laboratory Lab Results:  Recent Labs  08/16/14 0545 08/17/14 0615  WBC 18.6* 14.0*  HGB 10.5* 9.5*  HCT 32.8* 30.2*  PLT 282 259   BMET  Recent Labs  08/16/14 0545 08/17/14 0615  NA 135 136  K 4.6 4.0  CL 97 98  CO2 22 26  GLUCOSE 70 85  BUN 49* 31*  CREATININE 9.10* 7.12*  CALCIUM 7.6* 7.6*    COAG Lab Results  Component Value Date   INR 1.08 08/13/2014   INR 1.09 08/11/2014    INR 1.10 08/10/2014   No results found for: PTT

## 2014-08-17 NOTE — Progress Notes (Signed)
ANTIBIOTIC CONSULT NOTE - FOLLOW UP  Pharmacy Consult:  Vancomycin (D#8) Indication:  Rule out sepsis / cellulitis  No Known Allergies  Patient Measurements: Height: 5\' 11"  (180.3 cm) Weight: 252 lb 9.6 oz (114.579 kg) IBW/kg (Calculated) : 75.3  Vital Signs: Temp: 97.8 F (36.6 C) (04/22 0500) Temp Source: Oral (04/22 0500) BP: 113/83 mmHg (04/22 0500) Pulse Rate: 74 (04/22 0500) Intake/Output from previous day: 04/21 0701 - 04/22 0700 In: 240 [P.O.:240] Out: 2000   Labs:  Recent Labs  08/14/14 1220 08/16/14 0545 08/17/14 0615  WBC 13.7* 18.6* 14.0*  HGB 12.2* 10.5* 9.5*  PLT 271 282 259  CREATININE 6.00* 9.10* 7.12*   Estimated Creatinine Clearance: 14.4 mL/min (by C-G formula based on Cr of 7.12). No results for input(s): VANCOTROUGH, VANCOPEAK, VANCORANDOM, GENTTROUGH, GENTPEAK, GENTRANDOM, TOBRATROUGH, TOBRAPEAK, TOBRARND, AMIKACINPEAK, AMIKACINTROU, AMIKACIN in the last 72 hours.   Microbiology: Recent Results (from the past 720 hour(s))  MRSA PCR Screening     Status: None   Collection Time: 08/10/14  5:33 PM  Result Value Ref Range Status   MRSA by PCR NEGATIVE NEGATIVE Final    Comment:        The GeneXpert MRSA Assay (FDA approved for NASAL specimens only), is one component of a comprehensive MRSA colonization surveillance program. It is not intended to diagnose MRSA infection nor to guide or monitor treatment for MRSA infections.   Culture, blood (routine x 2)     Status: None   Collection Time: 08/10/14  6:08 PM  Result Value Ref Range Status   Specimen Description BLOOD LEFT ANTECUBITAL  Final   Special Requests   Final    BOTTLES DRAWN AEROBIC AND ANAEROBIC 10 CC BLUE 8CC RED   Culture   Final    NO GROWTH 5 DAYS Performed at [facility]    Report Status 08/17/2014 FINAL  Final  Culture, blood (routine x 2)     Status: None   Collection Time: 08/10/14  6:18 PM  Result Value Ref Range Status   Specimen Description BLOOD RIGHT  HAND  Final   Special Requests BOTTLES DRAWN AEROBIC ONLY 8 CC  Final   Culture   Final    NO GROWTH 5 DAYS Performed at [facility]    Report Status 08/17/2014 FINAL  Final  Clostridium Difficile by PCR     Status: None   Collection Time: 08/13/14  1:35 AM  Result Value Ref Range Status   C difficile by pcr NEGATIVE NEGATIVE Final  Anaerobic culture     Status: None (Preliminary result)   Collection Time: 08/13/14 12:46 PM  Result Value Ref Range Status   Specimen Description WOUND LEFT GRAFT  Final   Special Requests POF ANCEF  Final   Gram Stain   Final    NO WBC SEEN NO SQUAMOUS EPITHELIAL CELLS SEEN NO ORGANISMS SEEN Performed at [facility]    Culture   Final    NO ANAEROBES ISOLATED; CULTURE IN PROGRESS FOR 5 DAYS Performed at [facility]    Report Status PENDING  Incomplete  Wound culture     Status: None   Collection Time: 08/13/14 12:46 PM  Result Value Ref Range Status   Specimen Description WOUND LEFT GRAFT  Final   Special Requests POF ANCEF  Final   Gram Stain   Final    NO WBC SEEN NO SQUAMOUS EPITHELIAL CELLS SEEN NO ORGANISMS SEEN Performed at [facility]  Final    NO GROWTH 2 DAYS Performed at [facility]    Report Status 08/15/2014 FINAL  Final     Assessment: 59 YOM continues on vancomycin for possible sepsis and left thigh cellulitis with small abscess.  Patient has ESRD on HD on MTWF PTA.  Per Renal, patient to received HDTIW while hospitalized, next session will be on Saturday 08/18/14.  HD has been coinciding with TTS schedule.  Zosyn 4/15 >> 4/17 Vanc 4/16 >>  4/15 BCx2 - negative 4/18 thigh wound - negative Chronic hepatitis C - outpatient treatment   Goal of Therapy:  Vanc pre-HD level:  15-25 mcg/mL   Plan:  - Vanc 1gm IV q-HD TTS - Monitor HD schedule/tolerance, vanc pre-HD level as indicated - F/U abx LOT    Beatriz Quintela D. Laney Potash, PharmD, BCPS Pager:  [phone] 08/17/2014, 8:36 AM

## 2014-08-17 NOTE — Progress Notes (Signed)
[facility] PROGRESS NOTE  [name] THRALL GMW:[id number] DOB: 1955-07-12 DOA: 08/10/2014 PCP: Dyke Maes, MD    Interim summary:  42- year ol  Male with multiple medical  Problems including chronic diastolic heart failure, ESRD on HD 4 times a week, diet controlled DM, h/o hepatitis C, hypertension,  CAD s/p PCI, admitted for chest pain, back pain.  On admission he was found to be hypotensive and underwent HD on Friday 4 /14.     HPI/Subjective: Patient in bed, denies any headache, no chest pain or abdominal pain. No shortness of breath. No bleeding from the left thigh       Assessment/Plan:  Sepsis and bleeding from L thigh HD graft with Leukocytosis with hypotension:  Had elevated pro calcitonin , normal lactic acid level. Probably from sepsis from left thigh graft. His sepsis seems to be improving.  Vascular consulted Underwent revision of the left thigh AV graft and he underwent HD on 4/19. ID followin as well. On Vanco per ID.   MRI of the left thigh on  08/16/2014 noted with ? Abscess, Vascular surgery informed. He does have some induration and mild pus discharge around the left thigh incision site.   ID physician Dr. Luciana Axe following to rule out any other source of infection and to control her antibiotic regimen currently. Chest x-ray unremarkable, right upper quadrant ultrasound nonacute, pending transthoracic echogram to rule out any source of infection there. For now ID recommends long term IV Vanco for at least 4 weeks.    Transaminitis and rhabdomyolysis probably from myositis: Elevated liver enzymes likely due to combination of hypotension with shock liver, rhabdomyolysis being on high-dose statin. Seen by GI. Does have history of hep C and remote history of hep B. No further GI workup per gastroenterology besides outpatient follow-up for HCV. Trend of liver enzymes is improving serially.   Rhabdomyolysis. Left thigh MRI under sedation ordered for 08/16/2014,  was also on high-dose statin. Monitor trend of CK which is improving.   Hx of hepatitis C infection. Elevated viral load, will follow with GI outpatient along with ID.   Chest pain / abnormal EKG/ ELEVATED troponin's CAD s/p PCI: chest pain resolved. Underwent stress myoview. Results are unremarkable. Cardiology signed off. Off Effient for the vascular procedure on 4/18.    ESRD ON  HD: Further management as per renal. HD 4 times a week. Renal on board.     Anemia:AOCD Mild and normocytic.    Diabetes Mellitus: CBG (last 3)   Recent Labs  08/16/14 1841 08/16/14 2130 08/17/14 0803  GLUCAP 96 90 101*   A1c 5.4 and  Diet controlled DM.     Hypertension: controlled.    Chronic diastolic heart failure EF not mentioned on last echo : He appears to be compensated.    Chronic low back pain. MRI stable. Supportive care.      DVT prophylaxis  Heparin  Code Status: full code Family Communication: NONE at bedside Disposition Plan: pending further investigation..    Consultants:  Renal  Vascular  Cardiology   ID  GI  Procedures:  Stress myoview  HD  Revision of the left thigh medial graft  Anti-infectives    Start     Dose/Rate Route Frequency Ordered Stop   08/18/14 1200  vancomycin (VANCOCIN) IVPB 1000 mg/200 mL premix     1,000 mg 200 mL/hr over 60 Minutes Intravenous Every T-Th-Sa (Hemodialysis) 08/17/14 0836     08/16/14 1200  vancomycin (VANCOCIN) IVPB 1000 mg/200 mL  premix     1,000 mg 200 mL/hr over 60 Minutes Intravenous  Once 08/16/14 1145 08/16/14 1330   08/14/14 0945  vancomycin (VANCOCIN) IVPB 1000 mg/200 mL premix     1,000 mg 200 mL/hr over 60 Minutes Intravenous  Once 08/14/14 0933 08/14/14 1306   08/11/14 2000  vancomycin (VANCOCIN) 2,000 mg in sodium chloride 0.9 % 500 mL IVPB     2,000 mg 250 mL/hr over 120 Minutes Intravenous  Once 08/11/14 1803 08/11/14 2343   08/10/14 1700  piperacillin-tazobactam (ZOSYN) IVPB 2.25 g  Status:   Discontinued     2.25 g 100 mL/hr over 30 Minutes Intravenous 3 times per day 08/10/14 1646 08/12/14 1054      Objective:  Filed Vitals:   08/16/14 1830 08/16/14 2100 08/17/14 0000 08/17/14 0500  BP: 125/78 105/50 92/48 113/83  Pulse: 81 82 74 74  Temp: 97.9 F (36.6 C) 98 F (36.7 C) 98.3 F (36.8 C) 97.8 F (36.6 C)  TempSrc: Oral Oral Oral Oral  Resp: Height:      Weight: 112.4 kg (247 lb 12.8 oz)   114.579 kg (252 lb 9.6 oz)  SpO2: 98% 99% 96% 100%    Intake/Output Summary (Last 24 hours) at 08/17/14 1101 Last data filed at 08/16/14 2000  Gross per 24 hour  Intake    240 ml  Output   2000 ml  Net  -1760 ml   Filed Weights   08/16/14 1425 08/16/14 1830 08/17/14 0500  Weight: 114.4 kg (252 lb 3.3 oz) 112.4 kg (247 lb 12.8 oz) 114.579 kg (252 lb 9.6 oz)    Exam:   General:  Alert not indistress  Cardiovascular: s1s2  Respiratory: diminished air entry at bases  Abdomen: soft non tender non distended bowel sounds heard  Musculoskeletal: pedal edema present  L thigh - incision site and a bandage, mild surrounding erythema and induration, no physical signs of compartment syndrome in the left thigh  Data Reviewed: Basic Metabolic Panel:  Recent Labs Lab 08/13/14 0513 08/14/14 0700 08/14/14 1220 08/16/14 0545 08/17/14 0615  NA 135 134* 137 135 136  K 4.9 5.0 4.4 4.6 4.0  CL 94* 94* 99 97 98  CO2 GLUCOSE 79 101* 82 70 85  BUN 56* 67* 31* 49* 31*  CREATININE 9.04* 10.61* 6.00* 9.10* 7.12*  CALCIUM 8.1* 7.6* 9.2 7.6* 7.6*  PHOS  --   --  4.5  --   --    Liver Function Tests:  Recent Labs Lab 08/12/14 0400 08/13/14 0513 08/14/14 0700 08/14/14 1220 08/16/14 0545 08/17/14 0615  AST 1533* 1608* 1774*  --  1498* 1128*  ALT 283* 322* 318*  --  150* 92*  ALKPHOS 203* 195* 219*  --  150* 134*  BILITOT 1.8* 1.4* 1.1  --  0.7 0.7  PROT 7.4 7.2 6.8  --  6.6 6.8  ALBUMIN 2.1* 2.1* 2.1* 2.3* 2.1* 2.1*    Recent Labs Lab  08/10/14 1830  LIPASE 31   No results for input(s): AMMONIA in the last 168 hours. CBC:  Recent Labs Lab 08/10/14 1248  08/13/14 0513 08/14/14 0400 08/14/14 1220 08/16/14 0545 08/17/14 0615  WBC 14.8*  < > 16.5* 14.1* 13.7* 18.6* 14.0*  NEUTROABS 13.5*  --   --   --   --   --   --   HGB 12.1*  < > 10.5* 10.6* 12.2* 10.5* 9.5*  HCT 37.7*  < >  33.3* 33.0* 36.8* 32.8* 30.2*  MCV 95.7  < > 96.2 95.7 94.8 96.8 98.1  PLT 253  < > 272 288 271 282 259  < > = values in this interval not displayed. Cardiac Enzymes:  Recent Labs Lab 08/10/14 1437 08/10/14 1830 08/11/14 1026 08/11/14 1542 08/11/14 2104 08/14/14 0700 08/16/14 0545 08/17/14 0615  CKTOTAL  --  11720* 14273*  --   --  16194* 9482* 6751*  TROPONINI 0.12*  --  1.98* 2.11* 2.37*  --   --   --    BNP (last 3 results) No results for input(s): BNP in the last 8760 hours.  ProBNP (last 3 results) No results for input(s): PROBNP in the last 8760 hours.  CBG:  Recent Labs Lab 08/16/14 0725 08/16/14 1046 08/16/14 1841 08/16/14 2130 08/17/14 0803  GLUCAP 78 94 96 90 101*    Recent Results (from the past 240 hour(s))  MRSA PCR Screening     Status: None   Collection Time: 08/10/14  5:33 PM  Result Value Ref Range Status   MRSA by PCR NEGATIVE NEGATIVE Final    Comment:        The GeneXpert MRSA Assay (FDA approved for NASAL specimens only), is one component of a comprehensive MRSA colonization surveillance program. It is not intended to diagnose MRSA infection nor to guide or monitor treatment for MRSA infections.   Culture, blood (routine x 2)     Status: None   Collection Time: 08/10/14  6:08 PM  Result Value Ref Range Status   Specimen Description BLOOD LEFT ANTECUBITAL  Final   Special Requests   Final    BOTTLES DRAWN AEROBIC AND ANAEROBIC 10 CC BLUE 8CC RED   Culture   Final    NO GROWTH 5 DAYS Performed at [facility]    Report Status 08/17/2014 FINAL  Final  Culture, blood  (routine x 2)     Status: None   Collection Time: 08/10/14  6:18 PM  Result Value Ref Range Status   Specimen Description BLOOD RIGHT HAND  Final   Special Requests BOTTLES DRAWN AEROBIC ONLY 8 CC  Final   Culture   Final    NO GROWTH 5 DAYS Performed at [facility]    Report Status 08/17/2014 FINAL  Final  Clostridium Difficile by PCR     Status: None   Collection Time: 08/13/14  1:35 AM  Result Value Ref Range Status   C difficile by pcr NEGATIVE NEGATIVE Final  Anaerobic culture     Status: None (Preliminary result)   Collection Time: 08/13/14 12:46 PM  Result Value Ref Range Status   Specimen Description WOUND LEFT GRAFT  Final   Special Requests POF ANCEF  Final   Gram Stain   Final    NO WBC SEEN NO SQUAMOUS EPITHELIAL CELLS SEEN NO ORGANISMS SEEN Performed at [facility]    Culture   Final    NO ANAEROBES ISOLATED; CULTURE IN PROGRESS FOR 5 DAYS Performed at [facility]    Report Status PENDING  Incomplete  Wound culture     Status: None   Collection Time: 08/13/14 12:46 PM  Result Value Ref Range Status   Specimen Description WOUND LEFT GRAFT  Final   Special Requests POF ANCEF  Final   Gram Stain   Final    NO WBC SEEN NO SQUAMOUS EPITHELIAL CELLS SEEN NO ORGANISMS SEEN Performed at [facility]   Final  NO GROWTH 2 DAYS Performed at [facility]    Report Status 08/15/2014 FINAL  Final     Studies: Mr Lumbar Spine Wo Contrast  08/16/2014   CLINICAL DATA:  Dialysis patient with history of hepatitis. Severe back pain. Wound of the left thigh. Question diskitis.  EXAM: MRI LUMBAR SPINE WITHOUT CONTRAST  TECHNIQUE: Multiplanar, multisequence MR imaging of the lumbar spine was performed. No intravenous contrast was administered.  COMPARISON:  MRI lumbar spine 06/27/2014 and 11/06/2011. CT abdomen and pelvis 08/12/2014.  FINDINGS: Bone marrow signal is markedly heterogeneous as seen on the prior examination.  Scattered Schmorl's nodes are identified. No fluid within intervertebral disc spaces or marrow signal abnormality to suggest discitis or osteomyelitis is identified. The patient has a congenitally narrow central spinal canal. No fracture is identified. Trace anterolisthesis L3 on L4 is unchanged. Alignment is otherwise normal. Imaged intra-abdominal contents again demonstrate marked renal atrophy and extensive cyst formation.  The T11-12 and T12-L1 levels are imaged in the sagittal plane only and negative.  L1-2:  Negative.  L2-3: Shallow disc bulge is unchanged. The central canal and foramina appear open.  L3-4: Broad-based disc bulge, ligamentum flavum thickening and facet arthropathy cause severe central canal stenosis. Disc extends into the foramina causing severe bilateral foraminal narrowing. The appearance is not markedly changed since the most recent exam but worsened since the 2013 study.  L4-5: There is a shallow central protrusion and mild facet degenerative disease. The central canal and foramina are mildly narrowed.  L5-S1: Shallow disc bulge without central canal stenosis. The foramina are mildly narrowed.  IMPRESSION: Negative for discitis or osteomyelitis.  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 The appearance is not markedly changed since the most recent exam.   Electronically Signed   By: Drusilla Kanner M.D.   On: 08/16/2014 11:10   Mr Femur Left Wo Contrast  08/16/2014   CLINICAL DATA:  End-stage renal disease. Hepatitis-C. Total hip arthroplasty. Drainage from the thigh. Deep infection of the LEFT thigh.  EXAM: MR OF THE LEFT FEMUR WITHOUT CONTRAST  TECHNIQUE: Multiplanar, multisequence MR imaging was performed. No intravenous contrast was administered.  COMPARISON:  CT 08/12/2014.  Radiographs 10/12/2013.  FINDINGS: There is a large amount of susceptibility artifact in the proximal LEFT femoral diaphysis  associated with metallic foreign body identified on prior radiographs. There is no LEFT total hip arthroplasty. Severe LEFT hip osteoarthritis is present. Small LEFT hip effusion and degenerative synovitis. No convincing evidence of septic arthritis. No decubitus ulcers around the LEFT hip.  LEFT thigh AV fistula is present. Cystic lesion is present in the LEFT inguinal region measuring 40 mm x 20 mm on axial imaging. This probably represents a lymphocele or seroma. Ganglion cyst is unlikely given the location. Abscess is also unlikely given the lack of adjacent inflammatory change. Mild LEFT inguinal adenopathy is present.  Myositis is present in the semi tendinosis and biceps femoris of the LEFT thigh. Circumferential edema is present in the proximal LEFT thigh which is nonspecific but commonly associated with cellulitis. Superficial subcutaneous fluid is present along the lateral aspect of the LEFT thigh. This superficial subcutaneous fluid collection measures 5 cm AP, 9 mm thickest in the transverse dimension and 7.5 cm craniocaudal. Infiltrating subcutaneous edema is present more proximally. This subcutaneous lesion may represent a small abscess in a patient with a history of sepsis. The fluid overlies the midportion of the vastus lateralis muscle. No  deep soft tissue infection is identified.  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 This has a slightly different distribution than in the contralateral LEFT side. No my necrosis or deep soft tissue abscess in the incidentally visualized LEFT thigh.  ORIF of the RIGHT proximal femur is present with cannulated lag screws producing susceptibility artifact. 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  IMPRESSION: 1. Severe LEFT hip osteoarthritis. Cannulated lag screws in the RIGHT hip. 2. Susceptibility artifact associated with metallic foreign body in the proximal LEFT femoral diaphysis degrading the evaluation of the deep soft tissues in this region.  3. No deep soft tissue abscess. 4. Small thin lateral LEFT thigh subcutaneous fluid collection measuring only about 1 cm in thickness overlies the vastus lateralis. This is nonspecific but could represent a small abscess. 5. Diffuse subcutaneous edema of the LEFT thigh, which may represent cellulitis in the appropriate clinical setting. 6. Nonspecific myositis of the hamstrings bilaterally. In the setting of prior sepsis, this is most likely infectious.   Electronically Signed   By: Andreas Newport M.D.   On: 08/16/2014 11:15   Dg Chest Port 1 View  08/16/2014   CLINICAL DATA:  Shortness of breath for 1 day.  EXAM: PORTABLE CHEST - 1 VIEW  COMPARISON:  08/10/2014.  FINDINGS: Stable enlarged cardiac silhouette. Interval mild prominence of the pulmonary vasculature. Stable mildly prominent interstitial markings. No pleural fluid. Thoracic spine degenerative changes. Bilateral neck surgical clips.  IMPRESSION: 1. Interval mild pulmonary vascular congestion with stable cardiomegaly. 2. Stable mild chronic interstitial lung disease.   Electronically Signed   By: Beckie Salts M.D.   On: 08/16/2014 20:15   US Abdomen Limited Ruq  08/17/2014   CLINICAL DATA:  Fever, increased WBC, increased LFT  EXAM: US ABDOMEN LIMITED - RIGHT UPPER QUADRANT  COMPARISON:  None.  FINDINGS: Gallbladder:  Multiple gallstones are noted within gallbladder largest measures 6 mm. No thickening of gallbladder wall. No sonographic Murphy's sign  Common bile duct:  Diameter: 5.6 mm in diameter within normal limits  Liver:  No focal lesion identified. Within normal limits in parenchymal echogenicity.  IMPRESSION: 1. Multiple gallstones are noted within gallbladder. 2. No thickening of gallbladder wall.  Normal CBD.   Electronically Signed   By: Natasha Mead M.D.   On: 08/17/2014 08:03    Scheduled Meds: . antiseptic oral rinse  7 mL Mouth Rinse BID  . aspirin EC  325 mg Oral Daily  . Chlorhexidine Gluconate Cloth  6 each Topical Q0600  .  heparin  5,000 Units Subcutaneous 3 times per day  . insulin aspart  0-9 Units Subcutaneous TID WC  . latanoprost  1 drop Both Eyes QHS  . morphine  30 mg Oral Q12H  . multivitamin  1 tablet Oral QHS  . [START ON 08/18/2014] vancomycin  1,000 mg Intravenous Q T,Th,Sa-HD   Continuous Infusions: . sodium chloride 10 mL/hr at 08/13/14 1033  . sodium chloride 10 mL/hr at 08/16/14 1157    Principal Problem:   Sepsis associated hypotension Active Problems:   End-stage renal disease on hemodialysis   History of placement of stent in LAD coronary artery   HTN (hypertension)   Radicular leg pain   Right-sided low back pain with right-sided sciatica   Elevated troponin   Abnormal transaminases   Metabolic encephalopathy   Chest pain   Shock circulatory   Pain in the chest   Weakness   Abnormal EKG   Abnormal liver function test  Lumbago   Diabetic foot ulcer   Myositis   NSTEMI (non-ST elevated myocardial infarction)   Thigh pain   Infection and inflammatory reaction due to cardiac device, implant, and graft   Back pain    Time spent: 25 minutes    SINGH,PRASHANT K  [facility] Pager [phone] If 7PM-7AM, please contact night-coverage at [email or web address], password [facility] 08/17/2014, 11:01 AM  LOS: 7 days

## 2014-08-17 NOTE — Progress Notes (Signed)
CSW received multiple calls today from Reyne Dumas, Pension scheme manager for [employer]. She stated that patient has approximately 43 Medicare covered days left in the facility and they are also assisting patient and and wife to get his Medicaid reinstated. Patient is very concerned that his Social Security check must be able to be given to his wife each month to pay bills. Once his Medicaid is reinstated, the facility feels that his check will be assigned to his wife.  Lorri Frederick. Jaci Lazier, [place] [phone]

## 2014-08-18 LAB — GLUCOSE, CAPILLARY
GLUCOSE-CAPILLARY: 81 mg/dL (ref 70–99)
GLUCOSE-CAPILLARY: 102 mg/dL — AB (ref 70–99)
GLUCOSE-CAPILLARY: 93 mg/dL (ref 70–99)
Glucose-Capillary: 91 mg/dL (ref 70–99)

## 2014-08-18 LAB — CBC
HCT: 30.6 % — ABNORMAL LOW (ref 39.0–52.0)
Hemoglobin: 9.7 g/dL — ABNORMAL LOW (ref 13.0–17.0)
MCH: 31.3 pg (ref 26.0–34.0)
MCHC: 31.7 g/dL (ref 30.0–36.0)
MCV: 98.7 fL (ref 78.0–100.0)
Platelets: 272 10*3/uL (ref 150–400)
RBC: 3.1 MIL/uL — ABNORMAL LOW (ref 4.22–5.81)
RDW: 18 % — AB (ref 11.5–15.5)
WBC: 16.2 10*3/uL — ABNORMAL HIGH (ref 4.0–10.5)

## 2014-08-18 LAB — COMPREHENSIVE METABOLIC PANEL
ALBUMIN: 2.1 g/dL — AB (ref 3.5–5.2)
ALT: 64 U/L — AB (ref 0–53)
AST: 869 U/L — AB (ref 0–37)
Alkaline Phosphatase: 116 U/L (ref 39–117)
Anion gap: 15 (ref 5–15)
BUN: 44 mg/dL — ABNORMAL HIGH (ref 6–23)
CALCIUM: 7.4 mg/dL — AB (ref 8.4–10.5)
CO2: 23 mmol/L (ref 19–32)
Chloride: 97 mmol/L (ref 96–112)
Creatinine, Ser: 9 mg/dL — ABNORMAL HIGH (ref 0.50–1.35)
GFR calc Af Amer: 7 mL/min — ABNORMAL LOW (ref >90)
GFR, EST NON AFRICAN AMERICAN: 6 mL/min — AB (ref >90)
GLUCOSE: 88 mg/dL (ref 70–99)
POTASSIUM: 4.2 mmol/L (ref 3.5–5.1)
Sodium: 135 mmol/L (ref 135–145)
Total Bilirubin: 0.9 mg/dL (ref 0.3–1.2)
Total Protein: 6.5 g/dL (ref 6.0–8.3)

## 2014-08-18 LAB — ANAEROBIC CULTURE: Gram Stain: NONE SEEN

## 2014-08-18 LAB — CK: Total CK: 5047 U/L — ABNORMAL HIGH (ref 7–232)

## 2014-08-18 MED ORDER — SODIUM CHLORIDE 0.9 % IV SOLN: 100.0000 mL | INTRAVENOUS | Status: DC | PRN

## 2014-08-18 MED ORDER — HEPARIN SODIUM (PORCINE) 1000 UNIT/ML DIALYSIS
40.0000 [IU]/kg | Freq: Once | INTRAMUSCULAR | Status: DC
  Filled 2014-08-18: qty 5

## 2014-08-18 MED ORDER — LIDOCAINE HCL (PF) 1 % IJ SOLN: 5.0000 mL | INTRAMUSCULAR | Status: DC | PRN

## 2014-08-18 MED ORDER — HEPARIN SODIUM (PORCINE) 1000 UNIT/ML DIALYSIS: 1000.0000 [IU] | INTRAMUSCULAR | Status: DC | PRN

## 2014-08-18 MED ORDER — NEPRO/CARBSTEADY PO LIQD
237.0000 mL | ORAL | Status: DC | PRN
  Filled 2014-08-18: qty 237

## 2014-08-18 MED ORDER — LIDOCAINE-PRILOCAINE 2.5-2.5 % EX CREA
1.0000 "application " | TOPICAL_CREAM | CUTANEOUS | Status: DC | PRN
  Filled 2014-08-18: qty 5

## 2014-08-18 MED ORDER — HYDROMORPHONE HCL 1 MG/ML IJ SOLN
INTRAMUSCULAR | Status: AC
  Filled 2014-08-18: qty 1

## 2014-08-18 MED ORDER — OXYCODONE-ACETAMINOPHEN 5-325 MG PO TABS
ORAL_TABLET | ORAL | Status: AC
  Filled 2014-08-18: qty 2

## 2014-08-18 MED ORDER — PENTAFLUOROPROP-TETRAFLUOROETH EX AERO: 1.0000 "application " | INHALATION_SPRAY | CUTANEOUS | Status: DC | PRN

## 2014-08-18 MED ORDER — ALTEPLASE 2 MG IJ SOLR
2.0000 mg | Freq: Once | INTRAMUSCULAR | Status: DC | PRN
  Filled 2014-08-18: qty 2

## 2014-08-18 NOTE — Progress Notes (Signed)
[facility] PROGRESS NOTE  [name] [name] DOB: Jan 22, 1956 DOA: 08/10/2014 PCP: Dyke Maes, MD    Interim summary:  89- year ol  Male with multiple medical  Problems including chronic diastolic heart failure, ESRD on HD 4 times a week, diet controlled DM, h/o hepatitis C, hypertension,  CAD s/p PCI, admitted for chest pain, back pain.  On admission he was found to be hypotensive and underwent HD on Friday 4 /14.     HPI/Subjective: Patient in bed, denies any headache, no chest pain or abdominal pain. No shortness of breath. No bleeding from the left thigh , some chronic left sided low back pain and left thigh pain.      Assessment/Plan:  Sepsis and bleeding from L thigh HD graft with Leukocytosis with hypotension:  Had elevated pro calcitonin , normal lactic acid level. Probably from sepsis from left thigh graft. His sepsis seems to be improving.  Vascular consulted Underwent revision of the left thigh AV graft and he underwent HD on 4/19. ID followin as well. On Vanco per ID.   MRI of the left thigh on  08/16/2014 noted with ? Abscess, Vascular surgery informed. He does have some induration and mild pus discharge around the left thigh incision site.   ID physician Dr. Luciana Axe following to rule out any other source of infection and to control her antibiotic regimen currently. Chest x-ray unremarkable, right upper quadrant ultrasound nonacute, stable TTE. For now ID recommends long term IV Vanco for at least 4 weeks. VVS following as well.    Transaminitis and rhabdomyolysis probably from myositis: Elevated liver enzymes likely due to combination of hypotension with shock liver, rhabdomyolysis being on high-dose statin. Seen by GI. Does have history of hep C and remote history of hep B. No further GI workup per gastroenterology besides outpatient follow-up for HCV. Liver enzymes trending down serially.   Rhabdomyolysis. Left thigh MRI under sedation ordered for  08/16/2014, was also on high-dose statin. Monitor trend of CK which continues to improve daily.   Hx of hepatitis C infection. Elevated viral load, will follow with GI outpatient along with ID.   Chest pain / abnormal EKG/ ELEVATED troponin's CAD s/p PCI: chest pain resolved. Underwent stress myoview. Results are unremarkable. Cardiology signed off. Off Effient for the vascular procedure on 4/18.    ESRD ON  HD: Further management as per renal. HD 4 times a week. Renal on board.     Anemia:AOCD Mild and normocytic.    Diabetes Mellitus: CBG (last 3)   Recent Labs  08/17/14 1628 08/17/14 1943 08/18/14 0753  GLUCAP 84 83 81   A1c 5.4 and  Diet controlled DM.     Hypertension: controlled.    Chronic diastolic heart failure EF 55% : He appears to be compensated.    Chronic low back pain. MRI stable. Supportive care.      DVT prophylaxis  Heparin   Code Status: full code Family Communication: NONE at bedside Disposition Plan: pending further investigation..    Consultants:  Renal  Vascular  Cardiology   ID  GI  Procedures:  Stress myoview  HD  Revision of the left thigh medial graft  TTE  - Left ventricle: The cavity size was normal. Wall thickness wasincreased in a pattern of mild LVH. Systolic function was normal.The estimated ejection fraction was in the range of 55% to 60%.Wall motion was normal; there were no regional wall motionabnormalities. Left ventricular diastolic function parameterswere normal. - Mitral valve: Calcified  annulus. - Right atrium: The atrium was mildly dilated.    Anti-infectives    Start     Dose/Rate Route Frequency Ordered Stop   08/18/14 1200  vancomycin (VANCOCIN) IVPB 1000 mg/200 mL premix     1,000 mg 200 mL/hr over 60 Minutes Intravenous Every T-Th-Sa (Hemodialysis) 08/17/14 0836     08/16/14 1200  vancomycin (VANCOCIN) IVPB 1000 mg/200 mL premix     1,000 mg 200 mL/hr over 60 Minutes Intravenous   Once 08/16/14 1145 08/16/14 1330   08/14/14 0945  vancomycin (VANCOCIN) IVPB 1000 mg/200 mL premix     1,000 mg 200 mL/hr over 60 Minutes Intravenous  Once 08/14/14 0933 08/14/14 1306   08/11/14 2000  vancomycin (VANCOCIN) 2,000 mg in sodium chloride 0.9 % 500 mL IVPB     2,000 mg 250 mL/hr over 120 Minutes Intravenous  Once 08/11/14 1803 08/11/14 2343   08/10/14 1700  piperacillin-tazobactam (ZOSYN) IVPB 2.25 g  Status:  Discontinued     2.25 g 100 mL/hr over 30 Minutes Intravenous 3 times per day 08/10/14 1646 08/12/14 1054      Objective:  Filed Vitals:   08/18/14 0827 08/18/14 0900 08/18/14 0930 08/18/14 1000  BP: 133/82 118/77 105/61 97/62  Pulse: 84 95 81 80  Temp: 96.2 F (35.7 C)     TempSrc: Oral     Resp: 20 18 13 14   Height:      Weight: 115.2 kg (253 lb 15.5 oz)     SpO2: 98%   99%    Intake/Output Summary (Last 24 hours) at 08/18/14 1026 Last data filed at 08/17/14 2000  Gross per 24 hour  Intake    340 ml  Output      0 ml  Net    340 ml   Filed Weights   08/17/14 0500 08/18/14 0431 08/18/14 0827  Weight: 114.579 kg (252 lb 9.6 oz) 114.17 kg (251 lb 11.2 oz) 115.2 kg (253 lb 15.5 oz)    Exam:   General:  Alert not indistress  Cardiovascular: s1s2  Respiratory: diminished air entry at bases  Abdomen: soft non tender non distended bowel sounds heard  Musculoskeletal: pedal edema present  L thigh - incision site and a bandage, mild surrounding erythema and induration, no physical signs of compartment syndrome in the left thigh  Data Reviewed: Basic Metabolic Panel:  Recent Labs Lab 08/14/14 0700 08/14/14 1220 08/16/14 0545 08/17/14 0615 08/18/14 0400  NA 134* 137 135 136 135  K 5.0 4.4 4.6 4.0 4.2  CL 94* 99 97 98 97  CO2 23 25 22 26 23   GLUCOSE 101* 82 70 85 88  BUN 67* 31* 49* 31* 44*  CREATININE 10.61* 6.00* 9.10* 7.12* 9.00*  CALCIUM 7.6* 9.2 7.6* 7.6* 7.4*  PHOS  --  4.5  --   --   --    Liver Function Tests:  Recent  Labs Lab 08/13/14 0513 08/14/14 0700 08/14/14 1220 08/16/14 0545 08/17/14 0615 08/18/14 0400  AST 1608* 1774*  --  1498* 1128* 869*  ALT 322* 318*  --  150* 92* 64*  ALKPHOS 195* 219*  --  150* 134* 116  BILITOT 1.4* 1.1  --  0.7 0.7 0.9  PROT 7.2 6.8  --  6.6 6.8 6.5  ALBUMIN 2.1* 2.1* 2.3* 2.1* 2.1* 2.1*   No results for input(s): LIPASE, AMYLASE in the last 168 hours. No results for input(s): AMMONIA in the last 168 hours. CBC:  Recent Labs Lab 08/14/14 0400  08/14/14 1220 08/16/14 0545 08/17/14 0615 08/18/14 0900  WBC 14.1* 13.7* 18.6* 14.0* 16.2*  HGB 10.6* 12.2* 10.5* 9.5* 9.7*  HCT 33.0* 36.8* 32.8* 30.2* 30.6*  MCV 95.7 94.8 96.8 98.1 98.7  PLT 288 271 282 259 272   Cardiac Enzymes:  Recent Labs Lab 08/11/14 1542 08/11/14 2104 08/14/14 0700 08/16/14 0545 08/17/14 0615 08/18/14 0400  CKTOTAL  --   --  16109* 9482* 6751* 5047*  TROPONINI 2.11* 2.37*  --   --   --   --    BNP (last 3 results) No results for input(s): BNP in the last 8760 hours.  ProBNP (last 3 results) No results for input(s): PROBNP in the last 8760 hours.  CBG:  Recent Labs Lab 08/17/14 0803 08/17/14 1132 08/17/14 1628 08/17/14 1943 08/18/14 0753  GLUCAP 101* 90 84 83 81    Recent Results (from the past 240 hour(s))  MRSA PCR Screening     Status: None   Collection Time: 08/10/14  5:33 PM  Result Value Ref Range Status   MRSA by PCR NEGATIVE NEGATIVE Final    Comment:        The GeneXpert MRSA Assay (FDA approved for NASAL specimens only), is one component of a comprehensive MRSA colonization surveillance program. It is not intended to diagnose MRSA infection nor to guide or monitor treatment for MRSA infections.   Culture, blood (routine x 2)     Status: None   Collection Time: 08/10/14  6:08 PM  Result Value Ref Range Status   Specimen Description BLOOD LEFT ANTECUBITAL  Final   Special Requests   Final    BOTTLES DRAWN AEROBIC AND ANAEROBIC 10 CC BLUE 8CC  RED   Culture   Final    NO GROWTH 5 DAYS Performed at [facility]    Report Status 08/17/2014 FINAL  Final  Culture, blood (routine x 2)     Status: None   Collection Time: 08/10/14  6:18 PM  Result Value Ref Range Status   Specimen Description BLOOD RIGHT HAND  Final   Special Requests BOTTLES DRAWN AEROBIC ONLY 8 CC  Final   Culture   Final    NO GROWTH 5 DAYS Performed at [facility]    Report Status 08/17/2014 FINAL  Final  Clostridium Difficile by PCR     Status: None   Collection Time: 08/13/14  1:35 AM  Result Value Ref Range Status   C difficile by pcr NEGATIVE NEGATIVE Final  Anaerobic culture     Status: None (Preliminary result)   Collection Time: 08/13/14 12:46 PM  Result Value Ref Range Status   Specimen Description WOUND LEFT GRAFT  Final   Special Requests POF ANCEF  Final   Gram Stain   Final    NO WBC SEEN NO SQUAMOUS EPITHELIAL CELLS SEEN NO ORGANISMS SEEN Performed at [facility]    Culture   Final    NO ANAEROBES ISOLATED; CULTURE IN PROGRESS FOR 5 DAYS Performed at [facility]    Report Status PENDING  Incomplete  Wound culture     Status: None   Collection Time: 08/13/14 12:46 PM  Result Value Ref Range Status   Specimen Description WOUND LEFT GRAFT  Final   Special Requests POF ANCEF  Final   Gram Stain   Final    NO WBC SEEN NO SQUAMOUS EPITHELIAL CELLS SEEN NO ORGANISMS SEEN Performed at [facility]   Final  NO GROWTH 2 DAYS Performed at [facility]    Report Status 08/15/2014 FINAL  Final     Studies: Mr Lumbar Spine Wo Contrast  08/16/2014   CLINICAL DATA:  Dialysis patient with history of hepatitis. Severe back pain. Wound of the left thigh. Question diskitis.  EXAM: MRI LUMBAR SPINE WITHOUT CONTRAST  TECHNIQUE: Multiplanar, multisequence MR imaging of the lumbar spine was performed. No intravenous contrast was administered.  COMPARISON:  MRI lumbar spine 06/27/2014  and 11/06/2011. CT abdomen and pelvis 08/12/2014.  FINDINGS: Bone marrow signal is markedly heterogeneous as seen on the prior examination. Scattered Schmorl's nodes are identified. No fluid within intervertebral disc spaces or marrow signal abnormality to suggest discitis or osteomyelitis is identified. The patient has a congenitally narrow central spinal canal. No fracture is identified. Trace anterolisthesis L3 on L4 is unchanged. Alignment is otherwise normal. Imaged intra-abdominal contents again demonstrate marked renal atrophy and extensive cyst formation.  The T11-12 and T12-L1 levels are imaged in the sagittal plane only and negative.  L1-2:  Negative.  L2-3: Shallow disc bulge is unchanged. The central canal and foramina appear open.  L3-4: Broad-based disc bulge, ligamentum flavum thickening and facet arthropathy cause severe central canal stenosis. Disc extends into the foramina causing severe bilateral foraminal narrowing. The appearance is not markedly changed since the most recent exam but worsened since the 2013 study.  L4-5: There is a shallow central protrusion and mild facet degenerative disease. The central canal and foramina are mildly narrowed.  L5-S1: Shallow disc bulge without central canal stenosis. The foramina are mildly narrowed.  IMPRESSION: Negative for discitis or osteomyelitis.  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 The appearance is not markedly changed since the most recent exam.   Electronically Signed   By: Drusilla Kanner M.D.   On: 08/16/2014 11:10   Mr Femur Left Wo Contrast  08/16/2014   CLINICAL DATA:  End-stage renal disease. Hepatitis-C. Total hip arthroplasty. Drainage from the thigh. Deep infection of the LEFT thigh.  EXAM: MR OF THE LEFT FEMUR WITHOUT CONTRAST  TECHNIQUE: Multiplanar, multisequence MR imaging was performed. No intravenous contrast was administered.  COMPARISON:  CT  08/12/2014.  Radiographs 10/12/2013.  FINDINGS: There is a large amount of susceptibility artifact in the proximal LEFT femoral diaphysis associated with metallic foreign body identified on prior radiographs. There is no LEFT total hip arthroplasty. Severe LEFT hip osteoarthritis is present. Small LEFT hip effusion and degenerative synovitis. No convincing evidence of septic arthritis. No decubitus ulcers around the LEFT hip.  LEFT thigh AV fistula is present. Cystic lesion is present in the LEFT inguinal region measuring 40 mm x 20 mm on axial imaging. This probably represents a lymphocele or seroma. Ganglion cyst is unlikely given the location. Abscess is also unlikely given the lack of adjacent inflammatory change. Mild LEFT inguinal adenopathy is present.  Myositis is present in the semi tendinosis and biceps femoris of the LEFT thigh. Circumferential edema is present in the proximal LEFT thigh which is nonspecific but commonly associated with cellulitis. Superficial subcutaneous fluid is present along the lateral aspect of the LEFT thigh. This superficial subcutaneous fluid collection measures 5 cm AP, 9 mm thickest in the transverse dimension and 7.5 cm craniocaudal. Infiltrating subcutaneous edema is present more proximally. This subcutaneous lesion may represent a small abscess in a patient with a history of sepsis. The fluid overlies the midportion of the vastus lateralis muscle. No  deep soft tissue infection is identified.  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 This has a slightly different distribution than in the contralateral LEFT side. No my necrosis or deep soft tissue abscess in the incidentally visualized LEFT thigh.  ORIF of the RIGHT proximal femur is present with cannulated lag screws producing susceptibility artifact. 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  IMPRESSION: 1. Severe LEFT hip osteoarthritis. Cannulated lag screws in the RIGHT hip. 2. Susceptibility artifact  associated with metallic foreign body in the proximal LEFT femoral diaphysis degrading the evaluation of the deep soft tissues in this region. 3. No deep soft tissue abscess. 4. Small thin lateral LEFT thigh subcutaneous fluid collection measuring only about 1 cm in thickness overlies the vastus lateralis. This is nonspecific but could represent a small abscess. 5. Diffuse subcutaneous edema of the LEFT thigh, which may represent cellulitis in the appropriate clinical setting. 6. Nonspecific myositis of the hamstrings bilaterally. In the setting of prior sepsis, this is most likely infectious.   Electronically Signed   By: Andreas Newport M.D.   On: 08/16/2014 11:15   Dg Chest Port 1 View  08/16/2014   CLINICAL DATA:  Shortness of breath for 1 day.  EXAM: PORTABLE CHEST - 1 VIEW  COMPARISON:  08/10/2014.  FINDINGS: Stable enlarged cardiac silhouette. Interval mild prominence of the pulmonary vasculature. Stable mildly prominent interstitial markings. No pleural fluid. Thoracic spine degenerative changes. Bilateral neck surgical clips.  IMPRESSION: 1. Interval mild pulmonary vascular congestion with stable cardiomegaly. 2. Stable mild chronic interstitial lung disease.   Electronically Signed   By: Beckie Salts M.D.   On: 08/16/2014 20:15   US Abdomen Limited Ruq  08/17/2014   CLINICAL DATA:  Fever, increased WBC, increased LFT  EXAM: US ABDOMEN LIMITED - RIGHT UPPER QUADRANT  COMPARISON:  None.  FINDINGS: Gallbladder:  Multiple gallstones are noted within gallbladder largest measures 6 mm. No thickening of gallbladder wall. No sonographic Murphy's sign  Common bile duct:  Diameter: 5.6 mm in diameter within normal limits  Liver:  No focal lesion identified. Within normal limits in parenchymal echogenicity.  IMPRESSION: 1. Multiple gallstones are noted within gallbladder. 2. No thickening of gallbladder wall.  Normal CBD.   Electronically Signed   By: Natasha Mead M.D.   On: 08/17/2014 08:03    Scheduled  Meds: . antiseptic oral rinse  7 mL Mouth Rinse BID  . aspirin EC  325 mg Oral Daily  . Chlorhexidine Gluconate Cloth  6 each Topical Q0600  . heparin  40 Units/kg Dialysis Once in dialysis  . heparin  5,000 Units Subcutaneous 3 times per day  . HYDROmorphone      . insulin aspart  0-9 Units Subcutaneous TID WC  . latanoprost  1 drop Both Eyes QHS  . morphine  30 mg Oral Q12H  . multivitamin  1 tablet Oral QHS  . vancomycin  1,000 mg Intravenous Q T,Th,Sa-HD   Continuous Infusions: . sodium chloride 10 mL/hr at 08/13/14 1033  . sodium chloride 10 mL/hr at 08/16/14 1610    Principal Problem:   Sepsis associated hypotension Active Problems:   End-stage renal disease on hemodialysis   History of placement of stent in LAD coronary artery   HTN (hypertension)   Radicular leg pain   Right-sided low back pain with right-sided sciatica   Elevated troponin   Abnormal transaminases   Metabolic encephalopathy   Chest pain   Shock circulatory   Pain in the  chest   Weakness   Abnormal EKG   Abnormal liver function test   Lumbago   Diabetic foot ulcer   Myositis   NSTEMI (non-ST elevated myocardial infarction)   Thigh pain   Infection and inflammatory reaction due to cardiac device, implant, and graft   Back pain    Time spent: 25 minutes    SINGH,PRASHANT K  [facility] Pager [phone] If 7PM-7AM, please contact night-coverage at [email or web address], password [facility] 08/18/2014, 10:26 AM  LOS: 8 days

## 2014-08-18 NOTE — Progress Notes (Signed)
  [name] Progress Note   Subjective: no complaints  Filed Vitals:   08/18/14 0930 08/18/14 1000 08/18/14 1030 08/18/14 1100  BP: 105/61 97/62 107/56 106/63  Pulse: 81 80 82 80  Temp:      TempSrc:      Resp: 13 14 15 14   Height:      Weight:      SpO2:  99%     Exam: Alert, chronically ill No jvd Chest clear bilat RRR no RG Abd soft, obese, NTND L thigh AVG patent No LE edema Neuro left leg slightly weaker than R , no sensory deficit  HD: AF MTWFri 3.5h  2/3.5 Bath  116kg  Heparin 11,000   L thigh AVG Venofer 50/wk, no ESA        Assessment: 1. ESRD  2. Bleeding AVG - ?infected, is draining some; try to keep in as using HD cath high risk for this pt. On vanc 3. LBP / spinal stenosis - severe dz, chronic issue 4. CAD 5. Anemia cont esa 6. MBD cont meds   Plan - HD today then resume usual sched    Vinson Moselle MD  pager [phone]    cell [phone]  08/18/2014, 11:43 AM     Recent Labs Lab 08/14/14 1220 08/16/14 0545 08/17/14 0615 08/18/14 0400  NA 137 135 136 135  K 4.4 4.6 4.0 4.2  CL 99 97 98 97  CO2 25 22 26 23   GLUCOSE 82 70 85 88  BUN 31* 49* 31* 44*  CREATININE 6.00* 9.10* 7.12* 9.00*  CALCIUM 9.2 7.6* 7.6* 7.4*  PHOS 4.5  --   --   --     Recent Labs Lab 08/16/14 0545 08/17/14 0615 08/18/14 0400  AST 1498* 1128* 869*  ALT 150* 92* 64*  ALKPHOS 150* 134* 116  BILITOT 0.7 0.7 0.9  PROT 6.6 6.8 6.5  ALBUMIN 2.1* 2.1* 2.1*    Recent Labs Lab 08/16/14 0545 08/17/14 0615 08/18/14 0900  WBC 18.6* 14.0* 16.2*  HGB 10.5* 9.5* 9.7*  HCT 32.8* 30.2* 30.6*  MCV 96.8 98.1 98.7  PLT 282 259 272   . antiseptic oral rinse  7 mL Mouth Rinse BID  . aspirin EC  325 mg Oral Daily  . Chlorhexidine Gluconate Cloth  6 each Topical Q0600  . heparin  40 Units/kg Dialysis Once in dialysis  . heparin  5,000 Units Subcutaneous 3 times per day  . insulin aspart  0-9 Units Subcutaneous TID WC  . latanoprost  1 drop Both Eyes  QHS  . morphine  30 mg Oral Q12H  . multivitamin  1 tablet Oral QHS  . oxyCODONE-acetaminophen      . vancomycin  1,000 mg Intravenous Q T,Th,Sa-HD   . sodium chloride 10 mL/hr at 08/13/14 1033  . sodium chloride 10 mL/hr at 08/16/14 0811   sodium chloride, sodium chloride, alteplase, feeding supplement (NEPRO CARB STEADY), fentaNYL (SUBLIMAZE) injection, heparin, HYDROmorphone (DILAUDID) injection, lidocaine (PF), lidocaine-prilocaine, ondansetron **OR** ondansetron (ZOFRAN) IV, oxyCODONE-acetaminophen, pentafluoroprop-tetrafluoroeth

## 2014-08-19 DIAGNOSIS — I509 Heart failure, unspecified: Secondary | ICD-10-CM | POA: Insufficient documentation

## 2014-08-19 LAB — COMPREHENSIVE METABOLIC PANEL
ALBUMIN: 2.2 g/dL — AB (ref 3.5–5.2)
ALT: 53 U/L (ref 0–53)
AST: 712 U/L — ABNORMAL HIGH (ref 0–37)
Alkaline Phosphatase: 114 U/L (ref 39–117)
Anion gap: 12 (ref 5–15)
BUN: 32 mg/dL — ABNORMAL HIGH (ref 6–23)
CALCIUM: 7.5 mg/dL — AB (ref 8.4–10.5)
CHLORIDE: 98 mmol/L (ref 96–112)
CO2: 25 mmol/L (ref 19–32)
Creatinine, Ser: 6.98 mg/dL — ABNORMAL HIGH (ref 0.50–1.35)
GFR calc non Af Amer: 8 mL/min — ABNORMAL LOW (ref >90)
GFR, EST AFRICAN AMERICAN: 9 mL/min — AB (ref >90)
GLUCOSE: 95 mg/dL (ref 70–99)
Potassium: 4.2 mmol/L (ref 3.5–5.1)
Sodium: 135 mmol/L (ref 135–145)
Total Bilirubin: 0.7 mg/dL (ref 0.3–1.2)
Total Protein: 7.1 g/dL (ref 6.0–8.3)

## 2014-08-19 LAB — CBC
HEMATOCRIT: 31.1 % — AB (ref 39.0–52.0)
HEMOGLOBIN: 9.7 g/dL — AB (ref 13.0–17.0)
MCH: 31 pg (ref 26.0–34.0)
MCHC: 31.2 g/dL (ref 30.0–36.0)
MCV: 99.4 fL (ref 78.0–100.0)
PLATELETS: 196 10*3/uL (ref 150–400)
RBC: 3.13 MIL/uL — ABNORMAL LOW (ref 4.22–5.81)
RDW: 18.1 % — ABNORMAL HIGH (ref 11.5–15.5)
WBC: 13.7 10*3/uL — AB (ref 4.0–10.5)

## 2014-08-19 LAB — GLUCOSE, CAPILLARY
Glucose-Capillary: 78 mg/dL (ref 70–99)
Glucose-Capillary: 77 mg/dL (ref 70–99)
Glucose-Capillary: 84 mg/dL (ref 70–99)
Glucose-Capillary: 88 mg/dL (ref 70–99)

## 2014-08-19 LAB — CK: Total CK: 3442 U/L — ABNORMAL HIGH (ref 7–232)

## 2014-08-19 MED ORDER — DEXTROSE 5 % IV SOLN
2.0000 g | INTRAVENOUS | Status: DC
  Administered 2014-08-20 – 2014-08-23 (×2): 2 g via INTRAVENOUS
  Filled 2014-08-19 (×5): qty 2

## 2014-08-19 MED ORDER — VANCOMYCIN HCL IN DEXTROSE 1-5 GM/200ML-% IV SOLN
1000.0000 mg | INTRAVENOUS | Status: DC
  Administered 2014-08-20: 1000 mg via INTRAVENOUS
  Filled 2014-08-19 (×3): qty 200

## 2014-08-19 MED ORDER — DEXTROSE 5 % IV SOLN
2.0000 g | Freq: Once | INTRAVENOUS | Status: AC
  Administered 2014-08-19: 2 g via INTRAVENOUS
  Filled 2014-08-19 (×2): qty 2

## 2014-08-19 NOTE — Progress Notes (Signed)
  [facility] Progress Note   Subjective: no complaints  Filed Vitals:   08/19/14 0600 08/19/14 0845 08/19/14 0900 08/19/14 0908  BP: 110/64 89/46 84/48  95/44  Pulse:  85    Temp:  98.8 F (37.1 C)    TempSrc:  Oral    Resp:  16    Height:      Weight:      SpO2:  98%     Exam: Alert, chronically ill No jvd Chest clear bilat RRR no RG Abd soft, obese, NTND L thigh AVG patent No LE edema Neuro left leg slightly weaker than R , no sensory deficit  HD: AF MTWFri 3.5h  (4h MWF while here) 2/3.5 Bath  116kg  Heparin 11,000   L thigh AVG Venofer 50/wk, no ESA        Assessment: 1. ESRD stable vol, MWF 4hr as inpt 2. AVG degeneration - revised w new medial limb 3. AVG suspected infxn -draining, cx's neg; see VVS , last perm access site, try to treat through 4. LBP / spinal stenosis - severe dz, chronic issue; not good surg candidate last admit per Nsurg 5. CAD 6. Anemia cont esa 7. MBD cont meds  Plan - HD tomorrow, resent drainage for cx; add Fortaz to Vanc as cx neg    Vinson Moselle MD  pager [phone]    cell [phone]  08/19/2014, 10:19 AM     Recent Labs Lab 08/14/14 1220  08/17/14 0615 08/18/14 0400 08/19/14 0400  NA 137  < > 136 135 135  K 4.4  < > 4.0 4.2 4.2  CL 99  < > 98 97 98  CO2 25  < > 26 23 25   GLUCOSE 82  < > 85 88 95  BUN 31*  < > 31* 44* 32*  CREATININE 6.00*  < > 7.12* 9.00* 6.98*  CALCIUM 9.2  < > 7.6* 7.4* 7.5*  PHOS 4.5  --   --   --   --   < > = values in this interval not displayed.  Recent Labs Lab 08/17/14 0615 08/18/14 0400 08/19/14 0400  AST 1128* 869* 712*  ALT 92* 64* 53  ALKPHOS 134* 116 114  BILITOT 0.7 0.9 0.7  PROT 6.8 6.5 7.1  ALBUMIN 2.1* 2.1* 2.2*    Recent Labs Lab 08/17/14 0615 08/18/14 0900 08/19/14 0400  WBC 14.0* 16.2* 13.7*  HGB 9.5* 9.7* 9.7*  HCT 30.2* 30.6* 31.1*  MCV 98.1 98.7 99.4  PLT 259 272 196   . antiseptic oral rinse  7 mL Mouth Rinse BID  . aspirin EC  325 mg Oral  Daily  . heparin  5,000 Units Subcutaneous 3 times per day  . insulin aspart  0-9 Units Subcutaneous TID WC  . latanoprost  1 drop Both Eyes QHS  . morphine  30 mg Oral Q12H  . multivitamin  1 tablet Oral QHS  . vancomycin  1,000 mg Intravenous Q T,Th,Sa-HD   . sodium chloride 10 mL/hr at 08/13/14 1033  . sodium chloride 10 mL/hr at 08/16/14 0811   fentaNYL (SUBLIMAZE) injection, HYDROmorphone (DILAUDID) injection, ondansetron **OR** ondansetron (ZOFRAN) IV, oxyCODONE-acetaminophen

## 2014-08-19 NOTE — Progress Notes (Signed)
ANTIBIOTIC CONSULT NOTE - FOLLOW UP  Pharmacy Consult:  Vancomycin / Elita Quick Indication:  Infected graft  No Known Allergies  Patient Measurements: Height: 5\' 11"  (180.3 cm) Weight: 249 lb 8 oz (113.172 kg) IBW/kg (Calculated) : 75.3  Vital Signs: Temp: 98.8 F (37.1 C) (04/24 0845) Temp Source: Oral (04/24 0845) BP: 95/44 mmHg (04/24 0908) Pulse Rate: 85 (04/24 0845) Intake/Output from previous day: 04/23 0701 - 04/24 0700 In: 600 [P.O.:600] Out: 2395   Labs:  Recent Labs  08/17/14 0615 08/18/14 0400 08/18/14 0900 08/19/14 0400  WBC 14.0*  --  16.2* 13.7*  HGB 9.5*  --  9.7* 9.7*  PLT 259  --  272 196  CREATININE 7.12* 9.00*  --  6.98*   Estimated Creatinine Clearance: 14.6 mL/min (by C-G formula based on Cr of 6.98). No results for input(s): VANCOTROUGH, VANCOPEAK, VANCORANDOM, GENTTROUGH, GENTPEAK, GENTRANDOM, TOBRATROUGH, TOBRAPEAK, TOBRARND, AMIKACINPEAK, AMIKACINTROU, AMIKACIN in the last 72 hours.   Microbiology: Recent Results (from the past 720 hour(s))  MRSA PCR Screening     Status: None   Collection Time: 08/10/14  5:33 PM  Result Value Ref Range Status   MRSA by PCR NEGATIVE NEGATIVE Final    Comment:        The GeneXpert MRSA Assay (FDA approved for NASAL specimens only), is one component of a comprehensive MRSA colonization surveillance program. It is not intended to diagnose MRSA infection nor to guide or monitor treatment for MRSA infections.   Culture, blood (routine x 2)     Status: None   Collection Time: 08/10/14  6:08 PM  Result Value Ref Range Status   Specimen Description BLOOD LEFT ANTECUBITAL  Final   Special Requests   Final    BOTTLES DRAWN AEROBIC AND ANAEROBIC 10 CC BLUE 8CC RED   Culture   Final    NO GROWTH 5 DAYS Performed at [facility]    Report Status 08/17/2014 FINAL  Final  Culture, blood (routine x 2)     Status: None   Collection Time: 08/10/14  6:18 PM  Result Value Ref Range Status   Specimen  Description BLOOD RIGHT HAND  Final   Special Requests BOTTLES DRAWN AEROBIC ONLY 8 CC  Final   Culture   Final    NO GROWTH 5 DAYS Performed at [facility]    Report Status 08/17/2014 FINAL  Final  Clostridium Difficile by PCR     Status: None   Collection Time: 08/13/14  1:35 AM  Result Value Ref Range Status   C difficile by pcr NEGATIVE NEGATIVE Final  Anaerobic culture     Status: None   Collection Time: 08/13/14 12:46 PM  Result Value Ref Range Status   Specimen Description WOUND LEFT GRAFT  Final   Special Requests POF ANCEF  Final   Gram Stain   Final    NO WBC SEEN NO SQUAMOUS EPITHELIAL CELLS SEEN NO ORGANISMS SEEN Performed at [facility]    Culture   Final    NO ANAEROBES ISOLATED Performed at [facility]    Report Status 08/18/2014 FINAL  Final  Wound culture     Status: None   Collection Time: 08/13/14 12:46 PM  Result Value Ref Range Status   Specimen Description WOUND LEFT GRAFT  Final   Special Requests POF ANCEF  Final   Gram Stain   Final    NO WBC SEEN NO SQUAMOUS EPITHELIAL CELLS SEEN NO ORGANISMS SEEN Performed at [facility]    Culture   Final    NO GROWTH 2 DAYS Performed at [facility]    Report Status 08/15/2014 FINAL  Final     Assessment: 59 YOM to continue on vancomycin and start ceftazidime for possible sepsis and left thigh cellulitis with small abscess.  Patient has ESRD on HD on MTWF PTA.  Per Renal, patient to received HDTIW while hospitalized.  HD has been coinciding with TTS schedule, but to start MWF tomorrow.  Zosyn 4/15 >> 4/17 Vanc 4/16 >> Elita Quick 4/24 >>  4/15 BCx2 - negative 4/18 thigh wound - negative Chronic hepatitis C - outpatient treatment 4/24 wound from HD graft cx -   Goal of Therapy:  Vanc pre-HD level:  15-25 mcg/mL   Plan:  - Vanc 1gm IV q-HD MWF - Elita Quick 2gm IV q-HD MWF, start now - Monitor HD schedule/tolerance, vanc pre-HD level prior to next HD  session    Kydan Shanholtzer D. Laney Potash, PharmD, BCPS Pager:  [phone] 08/19/2014, 12:14 PM

## 2014-08-19 NOTE — Progress Notes (Signed)
   Daily Progress Note  Assessment/Planning: POD #6 s/p L thigh AVG revision, near end-access, L thigh abscess   Consensus appears to be to leave L thigh AVG and try to sterilize the graft   Abscess is continuing to self draining.  Either the abscess will drain and heal by itself or it will enlarge and force removal of the entire graft.  I have the same concerns that Dr. Darrick Penna has: I&D abscess will result in creation of soft tissue defect adjacent to graft resulting in eventual rupture of the graft and removal.  Unfortunately, this is this patient last site for permanent access so attempt to salvage the graft should be made.  Pt will likely need chronic abx suppression.  Dr. Darrick Penna will check on the patient tomorrow   Subjective  - 3 Days Post-Op  No complaints, no fever or chills  Objective Filed Vitals:   08/18/14 1954 08/19/14 0001 08/19/14 0400 08/19/14 0600  BP: 109/56 97/56 98/41  110/64  Pulse: 81 80 78   Temp: 98.4 F (36.9 C) 98.3 F (36.8 C) 98.7 F (37.1 C)   TempSrc: Oral     Resp: 12 11 10    Height:      Weight:   249 lb 8 oz (113.172 kg)   SpO2: 96% 100% 99%     Intake/Output Summary (Last 24 hours) at 08/19/14 0838 Last data filed at 08/18/14 2000  Gross per 24 hour  Intake    600 ml  Output   2395 ml  Net  -1795 ml    VASC  Inc c/d/i,  Small amount of likely infected hematoma drain from punctate opening central to the L thigh AVG, +bruit, +weak thrill  Laboratory CBC    Component Value Date/Time   WBC 13.7* 08/19/2014 0400   HGB 9.7* 08/19/2014 0400   HCT 31.1* 08/19/2014 0400   PLT 196 08/19/2014 0400    BMET    Component Value Date/Time   NA 135 08/19/2014 0400   K 4.2 08/19/2014 0400   CL 98 08/19/2014 0400   CO2 25 08/19/2014 0400   GLUCOSE 95 08/19/2014 0400   BUN 32* 08/19/2014 0400   CREATININE 6.98* 08/19/2014 0400   CALCIUM 7.5* 08/19/2014 0400   GFRNONAA 8* 08/19/2014 0400   GFRAA 9* 08/19/2014 0400    Leonides Sake,  MD [facility] Office: [phone] Pager: [phone]  08/19/2014, 8:38 AM

## 2014-08-19 NOTE — Progress Notes (Signed)
[facility] PROGRESS NOTE  ARGUS CARAHER AVW:[id number] DOB: Sep 02, 1955 DOA: 08/10/2014 PCP: Dyke Maes, MD    Interim summary:  1- year ol  Male with multiple medical  Problems including chronic diastolic heart failure, ESRD on HD 4 times a week, diet controlled DM, h/o hepatitis C, hypertension,  CAD s/p PCI, admitted for chest pain, back pain.  On admission he was found to be hypotensive and underwent HD on Friday 4 /14.     HPI/Subjective: Patient in bed, denies any headache, no chest pain or abdominal pain. No shortness of breath. No bleeding from the left thigh , some chronic left sided low back pain and left thigh pain overall feels better.      Assessment/Plan:  Sepsis and bleeding from L thigh HD graft (abscess) with Leukocytosis with hypotension:  Had Underwent revision of the left thigh AV graft and he underwent HD on 4/19. MRI of the left thigh on  08/16/2014 noted with ? Abscess, VVS and ID following. Currently consensus appears to be that this is the last graft site for this patient, plan is to place him on chronic IV vancomycin for suppressive treatment and try to preserve this site. With IV vancomycin and his leukocytosis is improving and overall abscess site seems to be gradually improving as well. We will wait for for vascular to reevaluate on 08/20/2014 and get final antibiotic recommendations from ID.  No other obvious source of infection Chest x-ray unremarkable, right upper quadrant ultrasound nonacute, stable TTE. .    Transaminitis and rhabdomyolysis probably from myositis: Elevated liver enzymes likely due to combination of hypotension with shock liver, rhabdomyolysis being on high-dose statin. Seen by GI. Does have history of hep C and remote history of hep B. No further GI workup per gastroenterology besides outpatient follow-up for HCV. Liver enzymes trending down serially.   Rhabdomyolysis. Left thigh MRI under sedation ordered for 08/16/2014,  was also on high-dose statin. Monitor trend of CK which continues to improve daily.   Hx of hepatitis C infection. Elevated viral load, will follow with GI outpatient along with ID.   Chest pain / abnormal EKG/ ELEVATED troponin's CAD s/p PCI: chest pain resolved. Underwent stress myoview. Results are unremarkable. Cardiology signed off. Off Effient for the vascular procedure on 4/18.    ESRD ON  HD: Further management as per renal. HD 4 times a week. Renal on board.     Anemia:AOCD Mild and normocytic.    Diabetes Mellitus: CBG (last 3)   Recent Labs  08/18/14 1715 08/18/14 2111 08/19/14 0738  GLUCAP 102* 93 78   A1c 5.4 and  Diet controlled DM.     Hypertension: controlled.    Chronic diastolic heart failure EF 55% : He appears to be compensated.    Chronic low back pain. MRI stable. Supportive care.      DVT prophylaxis  Heparin   Code Status: full code Family Communication: NONE at bedside Disposition Plan: pending further investigation..    Consultants:  Renal  Vascular  Cardiology   ID  GI  Procedures:  Stress myoview  HD  Revision of the left thigh medial graft  TTE  - Left ventricle: The cavity size was normal. Wall thickness wasincreased in a pattern of mild LVH. Systolic function was normal.The estimated ejection fraction was in the range of 55% to 60%.Wall motion was normal; there were no regional wall motionabnormalities. Left ventricular diastolic function parameterswere normal. - Mitral valve: Calcified annulus. - Right atrium: The atrium  was mildly dilated.    Anti-infectives    Start     Dose/Rate Route Frequency Ordered Stop   08/18/14 1200  vancomycin (VANCOCIN) IVPB 1000 mg/200 mL premix     1,000 mg 200 mL/hr over 60 Minutes Intravenous Every T-Th-Sa (Hemodialysis) 08/17/14 0836     08/16/14 1200  vancomycin (VANCOCIN) IVPB 1000 mg/200 mL premix     1,000 mg 200 mL/hr over 60 Minutes Intravenous  Once  08/16/14 1145 08/16/14 1330   08/14/14 0945  vancomycin (VANCOCIN) IVPB 1000 mg/200 mL premix     1,000 mg 200 mL/hr over 60 Minutes Intravenous  Once 08/14/14 0933 08/14/14 1306   08/11/14 2000  vancomycin (VANCOCIN) 2,000 mg in sodium chloride 0.9 % 500 mL IVPB     2,000 mg 250 mL/hr over 120 Minutes Intravenous  Once 08/11/14 1803 08/11/14 2343   08/10/14 1700  piperacillin-tazobactam (ZOSYN) IVPB 2.25 g  Status:  Discontinued     2.25 g 100 mL/hr over 30 Minutes Intravenous 3 times per day 08/10/14 1646 08/12/14 1054      Objective:  Filed Vitals:   08/19/14 0600 08/19/14 0845 08/19/14 0900 08/19/14 0908  BP: 110/64 89/46 84/48  95/44  Pulse:  85    Temp:  98.8 F (37.1 C)    TempSrc:  Oral    Resp:  16    Height:      Weight:      SpO2:  98%      Intake/Output Summary (Last 24 hours) at 08/19/14 1036 Last data filed at 08/19/14 0840  Gross per 24 hour  Intake    840 ml  Output   2395 ml  Net  -1555 ml   Filed Weights   08/18/14 0827 08/18/14 1300 08/19/14 0400  Weight: 115.2 kg (253 lb 15.5 oz) 113 kg (249 lb 1.9 oz) 113.172 kg (249 lb 8 oz)    Exam:   General:  Alert not indistress  Cardiovascular: s1s2  Respiratory: diminished air entry at bases  Abdomen: soft non tender non distended bowel sounds heard  Musculoskeletal: pedal edema present  L thigh - incision site and a bandage, mild surrounding erythema and induration, no physical signs of compartment syndrome in the left thigh  Data Reviewed: Basic Metabolic Panel:  Recent Labs Lab 08/14/14 1220 08/16/14 0545 08/17/14 0615 08/18/14 0400 08/19/14 0400  NA 137 135 136 135 135  K 4.4 4.6 4.0 4.2 4.2  CL 99 97 98 97 98  CO2 GLUCOSE 82 70 85 88 95  BUN 31* 49* 31* 44* 32*  CREATININE 6.00* 9.10* 7.12* 9.00* 6.98*  CALCIUM 9.2 7.6* 7.6* 7.4* 7.5*  PHOS 4.5  --   --   --   --    Liver Function Tests:  Recent Labs Lab 08/14/14 0700 08/14/14 1220 08/16/14 0545  08/17/14 0615 08/18/14 0400 08/19/14 0400  AST 1774*  --  1498* 1128* 869* 712*  ALT 318*  --  150* 92* 64* 53  ALKPHOS 219*  --  150* 134* 116 114  BILITOT 1.1  --  0.7 0.7 0.9 0.7  PROT 6.8  --  6.6 6.8 6.5 7.1  ALBUMIN 2.1* 2.3* 2.1* 2.1* 2.1* 2.2*   No results for input(s): LIPASE, AMYLASE in the last 168 hours. No results for input(s): AMMONIA in the last 168 hours. CBC:  Recent Labs Lab 08/14/14 1220 08/16/14 0545 08/17/14 0615 08/18/14 0900 08/19/14 0400  WBC 13.7* 18.6* 14.0* 16.2* 13.7*  HGB 12.2* 10.5* 9.5* 9.7* 9.7*  HCT 36.8* 32.8* 30.2* 30.6* 31.1*  MCV 94.8 96.8 98.1 98.7 99.4  PLT 271 282 259 272 196   Cardiac Enzymes:  Recent Labs Lab 08/14/14 0700 08/16/14 0545 08/17/14 0615 08/18/14 0400 08/19/14 0400  CKTOTAL 11914* 9482* 6751* 5047* 3442*   BNP (last 3 results) No results for input(s): BNP in the last 8760 hours.  ProBNP (last 3 results) No results for input(s): PROBNP in the last 8760 hours.  CBG:  Recent Labs Lab 08/18/14 0753 08/18/14 1428 08/18/14 1715 08/18/14 2111 08/19/14 0738  GLUCAP 81 91 102* 93 78    Recent Results (from the past 240 hour(s))  MRSA PCR Screening     Status: None   Collection Time: 08/10/14  5:33 PM  Result Value Ref Range Status   MRSA by PCR NEGATIVE NEGATIVE Final    Comment:        The GeneXpert MRSA Assay (FDA approved for NASAL specimens only), is one component of a comprehensive MRSA colonization surveillance program. It is not intended to diagnose MRSA infection nor to guide or monitor treatment for MRSA infections.   Culture, blood (routine x 2)     Status: None   Collection Time: 08/10/14  6:08 PM  Result Value Ref Range Status   Specimen Description BLOOD LEFT ANTECUBITAL  Final   Special Requests   Final    BOTTLES DRAWN AEROBIC AND ANAEROBIC 10 CC BLUE 8CC RED   Culture   Final    NO GROWTH 5 DAYS Performed at [facility]    Report Status 08/17/2014 FINAL  Final   Culture, blood (routine x 2)     Status: None   Collection Time: 08/10/14  6:18 PM  Result Value Ref Range Status   Specimen Description BLOOD RIGHT HAND  Final   Special Requests BOTTLES DRAWN AEROBIC ONLY 8 CC  Final   Culture   Final    NO GROWTH 5 DAYS Performed at [facility]    Report Status 08/17/2014 FINAL  Final  Clostridium Difficile by PCR     Status: None   Collection Time: 08/13/14  1:35 AM  Result Value Ref Range Status   C difficile by pcr NEGATIVE NEGATIVE Final  Anaerobic culture     Status: None   Collection Time: 08/13/14 12:46 PM  Result Value Ref Range Status   Specimen Description WOUND LEFT GRAFT  Final   Special Requests POF ANCEF  Final   Gram Stain   Final    NO WBC SEEN NO SQUAMOUS EPITHELIAL CELLS SEEN NO ORGANISMS SEEN Performed at [facility]    Culture   Final    NO ANAEROBES ISOLATED Performed at [facility]    Report Status 08/18/2014 FINAL  Final  Wound culture     Status: None   Collection Time: 08/13/14 12:46 PM  Result Value Ref Range Status   Specimen Description WOUND LEFT GRAFT  Final   Special Requests POF ANCEF  Final   Gram Stain   Final    NO WBC SEEN NO SQUAMOUS EPITHELIAL CELLS SEEN NO ORGANISMS SEEN Performed at [facility]    Culture   Final    NO GROWTH 2 DAYS Performed at [facility]    Report Status 08/15/2014 FINAL  Final     Studies: No results found.  Scheduled Meds: . antiseptic oral rinse  7 mL Mouth Rinse BID  . aspirin EC  325  mg Oral Daily  . heparin  5,000 Units Subcutaneous 3 times per day  . insulin aspart  0-9 Units Subcutaneous TID WC  . latanoprost  1 drop Both Eyes QHS  . morphine  30 mg Oral Q12H  . multivitamin  1 tablet Oral QHS  . vancomycin  1,000 mg Intravenous Q T,Th,Sa-HD   Continuous Infusions: . sodium chloride 10 mL/hr at 08/13/14 1033  . sodium chloride 10 mL/hr at 08/16/14 2585    Principal Problem:   Sepsis associated  hypotension Active Problems:   End-stage renal disease on hemodialysis   History of placement of stent in LAD coronary artery   HTN (hypertension)   Radicular leg pain   Right-sided low back pain with right-sided sciatica   Elevated troponin   Abnormal transaminases   Metabolic encephalopathy   Chest pain   Shock circulatory   Pain in the chest   Weakness   Abnormal EKG   Abnormal liver function test   Lumbago   Diabetic foot ulcer   Myositis   NSTEMI (non-ST elevated myocardial infarction)   Thigh pain   Infection and inflammatory reaction due to cardiac device, implant, and graft   Back pain    Time spent: 25 minutes    Arrion Broaddus K  [facility] Pager [phone] If 7PM-7AM, please contact night-coverage at [email or web address], password [facility] 08/19/2014, 10:36 AM  LOS: 9 days

## 2014-08-20 LAB — VANCOMYCIN, RANDOM: Vancomycin Rm: 21.8 ug/mL

## 2014-08-20 LAB — GLUCOSE, CAPILLARY
GLUCOSE-CAPILLARY: 84 mg/dL (ref 70–99)
GLUCOSE-CAPILLARY: 91 mg/dL (ref 70–99)
Glucose-Capillary: 102 mg/dL — ABNORMAL HIGH (ref 70–99)
Glucose-Capillary: 108 mg/dL — ABNORMAL HIGH (ref 70–99)

## 2014-08-20 MED ORDER — HEPARIN SODIUM (PORCINE) 1000 UNIT/ML DIALYSIS: 1000.0000 [IU] | INTRAMUSCULAR | Status: DC | PRN

## 2014-08-20 MED ORDER — LIDOCAINE-PRILOCAINE 2.5-2.5 % EX CREA
1.0000 "application " | TOPICAL_CREAM | CUTANEOUS | Status: DC | PRN
  Filled 2014-08-20: qty 5

## 2014-08-20 MED ORDER — ALTEPLASE 2 MG IJ SOLR
2.0000 mg | Freq: Once | INTRAMUSCULAR | Status: DC | PRN
  Filled 2014-08-20: qty 2

## 2014-08-20 MED ORDER — LIDOCAINE HCL (PF) 1 % IJ SOLN: 5.0000 mL | INTRAMUSCULAR | Status: DC | PRN

## 2014-08-20 MED ORDER — NEPRO/CARBSTEADY PO LIQD
237.0000 mL | ORAL | Status: DC | PRN
  Filled 2014-08-20: qty 237

## 2014-08-20 MED ORDER — PENTAFLUOROPROP-TETRAFLUOROETH EX AERO: 1.0000 "application " | INHALATION_SPRAY | CUTANEOUS | Status: DC | PRN

## 2014-08-20 MED ORDER — OXYCODONE-ACETAMINOPHEN 5-325 MG PO TABS
ORAL_TABLET | ORAL | Status: AC
  Filled 2014-08-20: qty 1

## 2014-08-20 MED ORDER — SODIUM CHLORIDE 0.9 % IV SOLN: 100.0000 mL | INTRAVENOUS | Status: DC | PRN

## 2014-08-20 MED ORDER — HEPARIN SODIUM (PORCINE) 1000 UNIT/ML DIALYSIS: 4000.0000 [IU] | INTRAMUSCULAR | Status: DC | PRN

## 2014-08-20 MED ORDER — NEPRO/CARBSTEADY PO LIQD
237.0000 mL | Freq: Two times a day (BID) | ORAL | Status: DC
  Filled 2014-08-20 (×9): qty 237

## 2014-08-20 NOTE — Progress Notes (Signed)
ANTIBIOTIC CONSULT NOTE - FOLLOW UP  Pharmacy Consult:  Vancomycin / Elita Quick Indication:  Infected graft  No Known Allergies  Patient Measurements: Height:  (180.3 cm) Weight: 238 lb 1.6 oz (108 kg) IBW/kg (Calculated) : 75.3  Vital Signs: Temp: 97.7 F (36.5 C) (04/25 1335) Temp Source: Oral (04/25 1335) BP: 93/62 mmHg (04/25 1530) Pulse Rate: 78 (04/25 1530) Intake/Output from previous day: 04/24 0701 - 04/25 0700 In: 780 [P.O.:780] Out: -   Labs:  Recent Labs  08/18/14 0400 08/18/14 0900 08/19/14 0400  WBC  --  16.2* 13.7*  HGB  --  9.7* 9.7*  PLT  --  272 196  CREATININE 9.00*  --  6.98*   Estimated Creatinine Clearance: 14.2 mL/min (by C-G formula based on Cr of 6.98).  Recent Labs  08/20/14 1050  VANCORANDOM 21.8     Microbiology: Recent Results (from the past 720 hour(s))  MRSA PCR Screening     Status: None   Collection Time: 08/10/14  5:33 PM  Result Value Ref Range Status   MRSA by PCR NEGATIVE NEGATIVE Final    Comment:        The GeneXpert MRSA Assay (FDA approved for NASAL specimens only), is one component of a comprehensive MRSA colonization surveillance program. It is not intended to diagnose MRSA infection nor to guide or monitor treatment for MRSA infections.   Culture, blood (routine x 2)     Status: None   Collection Time: 08/10/14  6:08 PM  Result Value Ref Range Status   Specimen Description BLOOD LEFT ANTECUBITAL  Final   Special Requests   Final    BOTTLES DRAWN AEROBIC AND ANAEROBIC 10 CC BLUE 8CC RED   Culture   Final    NO GROWTH 5 DAYS Performed at [facility]    Report Status 08/17/2014 FINAL  Final  Culture, blood (routine x 2)     Status: None   Collection Time: 08/10/14  6:18 PM  Result Value Ref Range Status   Specimen Description BLOOD RIGHT HAND  Final   Special Requests BOTTLES DRAWN AEROBIC ONLY 8 CC  Final   Culture   Final    NO GROWTH 5 DAYS Performed at [facility]    Report  Status 08/17/2014 FINAL  Final  Clostridium Difficile by PCR     Status: None   Collection Time: 08/13/14  1:35 AM  Result Value Ref Range Status   C difficile by pcr NEGATIVE NEGATIVE Final  Anaerobic culture     Status: None   Collection Time: 08/13/14 12:46 PM  Result Value Ref Range Status   Specimen Description WOUND LEFT GRAFT  Final   Special Requests POF ANCEF  Final   Gram Stain   Final    NO WBC SEEN NO SQUAMOUS EPITHELIAL CELLS SEEN NO ORGANISMS SEEN Performed at [facility]    Culture   Final    NO ANAEROBES ISOLATED Performed at [facility]    Report Status 08/18/2014 FINAL  Final  Wound culture     Status: None   Collection Time: 08/13/14 12:46 PM  Result Value Ref Range Status   Specimen Description WOUND LEFT GRAFT  Final   Special Requests POF ANCEF  Final   Gram Stain   Final    NO WBC SEEN NO SQUAMOUS EPITHELIAL CELLS SEEN NO ORGANISMS SEEN Performed at [facility]    Culture   Final    NO GROWTH 2 DAYS Performed  at [facility]    Report Status 08/15/2014 FINAL  Final  Wound culture     Status: None (Preliminary result)   Collection Time: 08/19/14 10:20 AM  Result Value Ref Range Status   Specimen Description HEMODIALYSIS GRAFT  Final   Special Requests Normal  Final   Gram Stain   Final    RARE WBC PRESENT, PREDOMINANTLY PMN NO SQUAMOUS EPITHELIAL CELLS SEEN NO ORGANISMS SEEN Performed at [facility]    Culture NO GROWTH Performed at [facility]   Final   Report Status PENDING  Incomplete     Assessment: 59 YOM to continue on vancomycin and ceftazidime for possible sepsis and left thigh cellulitis with small abscess.  Patient has ESRD on HD on MTWF PTA.  Per Renal, patient to received HDTIW while hospitalized.  HD has been coinciding with TTS schedule, but started MWF today. Abx day # 11 for left thigh graft abscess with pyomyositis.  4/25 vancomycin pre-HD level = 21.8 mcg/ml - within  goal of 15-25 mcg/ml.    Zosyn 4/15 >> 4/17 Vanc 4/16 >> Elita Quick 4/24 >>  4/25 HD graft wound>> ngtd 4/15 BCx2 - negativeF 4/18 thigh wound - negative Chronic hepatitis C - outpatient treatment -   Goal of Therapy:  Vanc pre-HD level:  15-25 mcg/mL   Plan:  - continue Vanc 1gm IV q-HD MWF - continue Fortaz 2gm IV q-HD MWF -per ID plan is treatment for 4 weeks to end May 14th  Herby Abraham, Ilda Basset.D. [phone] 08/20/2014 3:58 PM

## 2014-08-20 NOTE — Progress Notes (Signed)
CSW (Clinical Child psychotherapist) continues to follow pt case and update facility. Plan is to return to [facility] when pt is medically stable.  Julus Kelley, LCSWA [phone]

## 2014-08-20 NOTE — Progress Notes (Addendum)
[facility]  Date of Admission:  08/10/2014  Antibiotics: Vancomycin Ceftazidime added with increased WBC, now WBC down  Subjective: Some discomfort in leg  Objective: Temp:  [97.7 F (36.5 C)-98.5 F (36.9 C)] 97.7 F (36.5 C) (04/25 0728) Pulse Rate:  [71-88] 71 (04/25 0728) Resp:  [12-16] 16 (04/25 0728) BP: (95-124)/(50-66) 124/62 mmHg (04/25 0728) SpO2:  [96 %-100 %] 99 % (04/25 0728) Weight:  [255 lb 6.4 oz (115.849 kg)] 255 lb 6.4 oz (115.849 kg) (04/25 0400)  General: awake, alert, nad Skin: no rashes Lungs: CTA B Cor: RRR Ext: left leg with no drainage, dry  Lab Results Lab Results  Component Value Date   WBC 13.7* 08/19/2014   HGB 9.7* 08/19/2014   HCT 31.1* 08/19/2014   MCV 99.4 08/19/2014   PLT 196 08/19/2014    Lab Results  Component Value Date   CREATININE 6.98* 08/19/2014   BUN 32* 08/19/2014   NA 135 08/19/2014   K 4.2 08/19/2014   CL 98 08/19/2014   CO2 25 08/19/2014    Lab Results  Component Value Date   ALT 53 08/19/2014   AST 712* 08/19/2014   ALKPHOS 114 08/19/2014   BILITOT 0.7 08/19/2014      Microbiology: Recent Results (from the past 240 hour(s))  MRSA PCR Screening     Status: None   Collection Time: 08/10/14  5:33 PM  Result Value Ref Range Status   MRSA by PCR NEGATIVE NEGATIVE Final    Comment:        The GeneXpert MRSA Assay (FDA approved for NASAL specimens only), is one component of a comprehensive MRSA colonization surveillance program. It is not intended to diagnose MRSA infection nor to guide or monitor treatment for MRSA infections.   Culture, blood (routine x 2)     Status: None   Collection Time: 08/10/14  6:08 PM  Result Value Ref Range Status   Specimen Description BLOOD LEFT ANTECUBITAL  Final   Special Requests   Final    BOTTLES DRAWN AEROBIC AND ANAEROBIC 10 CC BLUE 8CC RED   Culture   Final    NO GROWTH 5 DAYS Performed at [facility]    Report Status  08/17/2014 FINAL  Final  Culture, blood (routine x 2)     Status: None   Collection Time: 08/10/14  6:18 PM  Result Value Ref Range Status   Specimen Description BLOOD RIGHT HAND  Final   Special Requests BOTTLES DRAWN AEROBIC ONLY 8 CC  Final   Culture   Final    NO GROWTH 5 DAYS Performed at [facility]    Report Status 08/17/2014 FINAL  Final  Clostridium Difficile by PCR     Status: None   Collection Time: 08/13/14  1:35 AM  Result Value Ref Range Status   C difficile by pcr NEGATIVE NEGATIVE Final  Anaerobic culture     Status: None   Collection Time: 08/13/14 12:46 PM  Result Value Ref Range Status   Specimen Description WOUND LEFT GRAFT  Final   Special Requests POF ANCEF  Final   Gram Stain   Final    NO WBC SEEN NO SQUAMOUS EPITHELIAL CELLS SEEN NO ORGANISMS SEEN Performed at [facility]    Culture   Final    NO ANAEROBES ISOLATED Performed at [facility]    Report Status 08/18/2014 FINAL  Final  Wound culture     Status: None  Collection Time: 08/13/14 12:46 PM  Result Value Ref Range Status   Specimen Description WOUND LEFT GRAFT  Final   Special Requests POF ANCEF  Final   Gram Stain   Final    NO WBC SEEN NO SQUAMOUS EPITHELIAL CELLS SEEN NO ORGANISMS SEEN Performed at [facility]    Culture   Final    NO GROWTH 2 DAYS Performed at [facility]    Report Status 08/15/2014 FINAL  Final  Wound culture     Status: None (Preliminary result)   Collection Time: 08/19/14 10:20 AM  Result Value Ref Range Status   Specimen Description HEMODIALYSIS GRAFT  Final   Special Requests Normal  Final   Gram Stain   Final    RARE WBC PRESENT, PREDOMINANTLY PMN NO SQUAMOUS EPITHELIAL CELLS SEEN NO ORGANISMS SEEN Performed at [facility]    Culture PENDING  Incomplete   Report Status PENDING  Incomplete    Studies/Results: No results found.  Assessment/Plan:  1) abscess in thigh - clinically improving     - Fortaz added and now WBC better.  Not MRSA colonized.  Therefore I would continue with Fortaz for 4-6 weeks to try to salvage fistula.  Could then consider suppression with Augmentin for 3-6 months and then stop and see what happens.    I will sign off, please call with questions.  We are available for follow up if needed.   Staci Righter, MD [email or web address] [phone] pager   [phone] cell 08/20/2014, 10:43 AM

## 2014-08-20 NOTE — Progress Notes (Signed)
NUTRITION FOLLOW-UP  DOCUMENTATION CODES Per approved criteria  -Obesity Unspecified   INTERVENTION:  Nepro Shake PO BID to maximize protein intake, each supplement provides 425 kcal and 19 grams protein.  Recommend renal multivitamin (Nephrovite) daily.  NUTRITION DIAGNOSIS: Increased nutrient needs related to ESRD on HD and wounds as evidenced by estimated needs, ongoing.  Goal: Intake to meet >90% of estimated nutrition needs. Progressing.  Monitor:  PO intake, labs, weight trend.  ASSESSMENT: Patient admitted on 4/15 with sepsis related to suspected infected left thigh HD graft.   Patient with diabetic left foot ulcer and infected thigh graft with pyomyositis. Needs increased protein to support wound healing. Weight continues to decrease. Patient is receiving a renal diet with 1200 ml fluid restriction. Consuming 50-100% of meals. Would benefit from a PO supplement to maximize oral intake.  Height: Ht Readings from Last 1 Encounters:  08/10/14  (1.803 m)    Weight: Wt Readings from Last 1 Encounters:  08/20/14 238 lb 1.6 oz (108 kg)   08/13/14 252 lb 13.9 oz (114.7 kg)        BMI:  Body mass index is 33.22 kg/(m^2).  Estimated Nutritional Needs: Kcal: 2400-2600 Protein: 110-130 gm Fluid: 1.2 L  Skin: left thigh infected graft site; left foot diabetic ulcer  Diet Order: Diet renal with fluid restriction Fluid restriction:: 1200 mL Fluid; Room service appropriate?: Yes; Fluid consistency:: Thin    Intake/Output Summary (Last 24 hours) at 08/20/14 1550 Last data filed at 08/20/14 0747  Gross per 24 hour  Intake    420 ml  Output      0 ml  Net    420 ml    Last BM: 4/24   Labs:   Recent Labs Lab 08/14/14 1220  08/17/14 0615 08/18/14 0400 08/19/14 0400  NA 137  < > 136 135 135  K 4.4  < > 4.0 4.2 4.2  CL 99  < > 98 97 98  CO2 25  < > BUN 31*  < > 31* 44* 32*  CREATININE 6.00*  < > 7.12* 9.00* 6.98*  CALCIUM 9.2  < > 7.6*  7.4* 7.5*  PHOS 4.5  --   --   --   --   GLUCOSE 82  < > 85 88 95  < > = values in this interval not displayed.  CBG (last 3)   Recent Labs  08/19/14 2119 08/20/14 0723 08/20/14 1120  GLUCAP 88 84 91    Scheduled Meds: . antiseptic oral rinse  7 mL Mouth Rinse BID  . aspirin EC  325 mg Oral Daily  . cefTAZidime (FORTAZ)  IV  2 g Intravenous Q M,W,F-HD  . heparin  5,000 Units Subcutaneous 3 times per day  . insulin aspart  0-9 Units Subcutaneous TID WC  . latanoprost  1 drop Both Eyes QHS  . morphine  30 mg Oral Q12H  . multivitamin  1 tablet Oral QHS  . vancomycin  1,000 mg Intravenous Q M,W,F-HD    Continuous Infusions: . sodium chloride 10 mL/hr at 08/13/14 1033  . sodium chloride 10 mL/hr at 08/16/14 1610    Past Medical History  Diagnosis Date  . Anxiety   . Back pain   . GERD (gastroesophageal reflux disease)   . Peripheral neuropathy   . Arthritis   . Chronic diastolic CHF (congestive heart failure)     a. 04/2013 Echo: EF nl, no rwma, mild LVH.  Marland Kitchen Active smoker   .  ESRD on hemodialysis     [name] 4 days per week on M-Tu-Wed and Fri.  Started HD in 1998 and has been on HD initially at [facility], then went to [place] [facility], then in [facility], then to [facility] and now is at [facility] for last 10 years.  Has L thigh AVG.     . Diabetes mellitus     diet controlled  . 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 PONV (postoperative nausea and vomiting)   . Hypertension   . Bell palsy   . Carpal tunnel syndrome     bilateral  . Palpitations   . Headache(784.0)     Hx: of Migraines  . CAD (coronary artery disease)     a. 04/2013 Ant STEMI/PCI: LM nl, LAD 95p (3.0x18 Xience DES), D1/2/3 small, LCX 80ost, RI 20p, RCA 17m CTO (unsuccessful PCI), EF 55%, mod basal inf HK.  . S/P coronary artery stent placement 05/21/2013    Xience Alpine Medtr[employer] conditional 5 @ 1.5 and 3T     Past Surgical History  Procedure Laterality Date  . Total hip  arthroplasty    . Thyroidectomy    . Tooth extraction    . Mandible fracture surgery    . Dg av dialysis graft declot or    . Insertion of dialysis catheter  03/28/2011    Procedure: INSERTION OF DIALYSIS CATHETER;  Surgeon: Pryor Ochoa, MD;  Location: [facility] OR;  Service: Vascular;  Laterality: Left;  . Av fistula placement  03/31/2011    Procedure: INSERTION OF ARTERIOVENOUS (AV) GORE-TEX GRAFT THIGH;  Surgeon: Sherren Kerns, MD;  Location: [facility] OR;  Service: Vascular;  Laterality: Right;  redo right thigh arteriovenous gortex graft using gore-tex stretch 6mm x 70cm  . Thrombectomy w/ embolectomy  06/02/2011    Procedure: THROMBECTOMY ARTERIOVENOUS GORE-TEX GRAFT;  Surgeon: Chuck Hint, MD;  Location: [facility] OR;  Service: Vascular;  Laterality: Right;  Thrombectomy right thigh arteriovenous gortex graft;  revision by  replacement of large portion of graft with 7mm gore-tex   . Insertion of dialysis catheter  08/28/2011    Procedure: INSERTION OF DIALYSIS CATHETER;  Surgeon: Fransisco Hertz, MD;  Location: [facility] OR;  Service: Vascular;  Laterality: Left;  Atempted Bilateral Internal Jugular, Bilateral Subclavin insertion of 55cm Dialysis Catheter Left Femoral  . Insertion of dialysis catheter  12/15/2011    Procedure: INSERTION OF DIALYSIS CATHETER;  Surgeon: Sherren Kerns, MD;  Location: [facility] OR;  Service: Vascular;  Laterality: Right;  Insertion of Right Femoral Dialysis Catheter  . Exchange of a dialysis catheter  02/26/2012    Procedure: EXCHANGE OF A DIALYSIS CATHETER;  Surgeon: Larina Earthly, MD;  Location: [facility] OR;  Service: Vascular;  Laterality: Right;  . Joint replacement    . Exchange of a dialysis catheter  05/18/2012    Procedure: EXCHANGE OF A DIALYSIS CATHETER;  Surgeon: Larina Earthly, MD;  Location: [facility] OR;  Service: Vascular;  Laterality: Right;  right femoral dialysis catheter  . Av fistula placement  05/18/2012    Procedure: INSERTION OF ARTERIOVENOUS (AV) GORE-TEX GRAFT THIGH;  Surgeon:  Larina Earthly, MD;  Location: [facility] OR;  Service: Vascular;  Laterality: Left;  using 6mm by 70cm goretex graft  . Thrombectomy w/ embolectomy Left 08/25/2012    Procedure: THROMBECTOMY ARTERIOVENOUS GORE-TEX GRAFT;  Surgeon: Pryor Ochoa, MD;  Location: [facility] OR;  Service: Vascular;  Laterality: Left;  Attempted thrombectomy of left thigh arteriovenous gortex graft.   . Av fistula placement Left 08/25/2012    Procedure: INSERTION OF ARTERIOVENOUS (AV) GORE-TEX GRAFT THIGH;  Surgeon: Pryor Ochoa, MD;  Location: [facility] OR;  Service: Vascular;  Laterality: Left;  Using 24mm x 40cm vascular Gortex graft.  . Revision of arteriovenous goretex graft Left 12/14/2012    Procedure: Revision of Left Thigh Graft;  Surgeon: Larina Earthly, MD;  Location: [facility] OR;  Service: Vascular;  Laterality: Left;  . Avgg removal Left 02/16/2013    Procedure: EXCISION OF LEFT ARM ARTERIOVENOUS GORETEX GRAFT TIMES 2 WITH VEIN PATCH ANGIOPLASTY OF BRACIAL ARTERY.  ;  Surgeon: Chuck Hint, MD;  Location: [facility] OR;  Service: Vascular;  Laterality: Left;  Converted from MAC to General.    . Revision of arteriovenous goretex graft Left 08/08/2013    Procedure: REVISION OF LEFT THIGH ARTERIOVENOUS GORETEX GRAFT;  Surgeon: Sherren Kerns, MD;  Location: [facility] OR;  Service: Vascular;  Laterality: Left;  . Venogram Bilateral 10/27/2011    Procedure: VENOGRAM;  Surgeon: Nada Libman, MD;  Location: [facility] CATH LAB;  Service: Cardiovascular;  Laterality: Bilateral;  bilat upper extrem venograms  . Left heart cath Bilateral 05/21/2013    Procedure: LEFT HEART CATH;  Surgeon: Iran Ouch, MD;  Location: [facility] CATH LAB;  Service: Cardiovascular;  Laterality: Bilateral;  . Percutaneous coronary stent intervention (pci-s)  05/21/2013    Procedure: PERCUTANEOUS CORONARY STENT INTERVENTION (PCI-S);  Surgeon: Iran Ouch, MD;  Location: [facility] CATH LAB;  Service: Cardiovascular;;  . Coronary angioplasty with stent placement    . Radiology with anesthesia  N/A 06/27/2014    Procedure: MRI LUMBER WITHOUT CONTRAST;  Surgeon: Medication Radiologist, MD;  Location: [facility] OR;  Service: Radiology;  Laterality: N/A;  . Revision of arteriovenous goretex graft Left 08/13/2014    Procedure: REVISION OF ARTERIOVENOUS GORETEX GRAFT LEFT THIGH;  Surgeon: Sherren Kerns, MD;  Location: [facility] OR;  Service: Vascular;  Laterality: Left;  . Radiology with anesthesia N/A 08/16/2014    Procedure: MRI;  Surgeon: Medication Radiologist, MD;  Location: [facility] OR;  Service: Radiology;  Laterality: N/A;    Joaquin Courts, RD, LDN, CNSC Pager [phone] After Hours Pager [phone]

## 2014-08-20 NOTE — Progress Notes (Signed)
[facility] PROGRESS NOTE  MAKAEL BUELNA OMA:[id number] DOB: Oct 14, 1955 DOA: 08/10/2014 PCP: Dyke Maes, MD    Interim summary:  73- year ol  Male with multiple medical  Problems including chronic diastolic heart failure, ESRD on HD 4 times a week, diet controlled DM, h/o hepatitis C, hypertension,  CAD s/p PCI, admitted for chest pain, back pain.  On admission he was found to be hypotensive and underwent HD on Friday 4 /14.     HPI/Subjective: Patient in bed, denies any headache, no chest pain or abdominal pain. No shortness of breath. No bleeding from the left thigh , some chronic left sided low back pain and left thigh pain overall feels better.      Assessment/Plan:  Sepsis and bleeding from L thigh HD graft (abscess) with Leukocytosis with hypotension:  Had Underwent revision of the left thigh AV graft on 4/19. MRI of the left thigh on  08/16/2014 noted with small Abscess, VVS and ID saw the patient.   Currently consensus to salvage the graft site as this appears to be that this is the last graft site for this patient, he has been treated with IV vancomycin and his leukocytosis is improving and overall abscess site seems to be gradually improving as well. Per ID he will be switched to IV Fortaz on 08/20/2014 for 4-6 weeks with close outpatient ID follow-up and then possibly placed on oral Augmentin chronically for suppressive treatment.  We will wait for for vascular to reevaluate on 08/20/2014,  likely to be discharged after dialysis on 08/21/2014  No other obvious source of infection Chest x-ray unremarkable, right upper quadrant ultrasound nonacute, stable TTE. .    Transaminitis and rhabdomyolysis probably from myositis: Elevated liver enzymes likely due to combination of hypotension with shock liver, rhabdomyolysis being on high-dose statin. Seen by GI. Does have history of hep C and remote history of hep B. No further GI workup per gastroenterology besides  outpatient follow-up for HCV. Liver enzymes trending down serially.   Rhabdomyolysis. Left thigh MRI under sedation ordered for 08/16/2014, was also on high-dose statin. Monitor trend of CK which continues to improve daily.   Hx of hepatitis C infection. Elevated viral load, will follow with GI outpatient along with ID.   Chest pain / abnormal EKG/ ELEVATED troponin's CAD s/p PCI: chest pain resolved. Underwent stress myoview. Results are unremarkable. Cardiology signed off. Off Effient for the vascular procedure on 4/18.    ESRD ON  HD: Further management as per renal. HD 4 times a week. Renal on board.     Anemia:AOCD Mild and normocytic.    Diabetes Mellitus: CBG (last 3)   Recent Labs  08/19/14 1655 08/19/14 2119 08/20/14 0723  GLUCAP 84 88 84   A1c 5.4 and  Diet controlled DM.     Hypertension: controlled.    Chronic diastolic heart failure EF 55% : He appears to be compensated.    Chronic low back pain. MRI stable. Supportive care.      DVT prophylaxis  Heparin   Code Status: full code Family Communication: NONE at bedside Disposition Plan: SNF 08/21/2014    Consultants:  Renal  Vascular  Cardiology   ID  GI  Procedures:  Stress myoview  HD  Revision of the left thigh medial graft  TTE  - Left ventricle: The cavity size was normal. Wall thickness wasincreased in a pattern of mild LVH. Systolic function was normal.The estimated ejection fraction was in the range of 55% to 60%.Wall  motion was normal; there were no regional wall motionabnormalities. Left ventricular diastolic function parameterswere normal. - Mitral valve: Calcified annulus. - Right atrium: The atrium was mildly dilated.    Anti-infectives    Start     Dose/Rate Route Frequency Ordered Stop   08/20/14 1200  vancomycin (VANCOCIN) IVPB 1000 mg/200 mL premix     1,000 mg 200 mL/hr over 60 Minutes Intravenous Every M-W-F (Hemodialysis) 08/19/14 1217     08/20/14  1200  cefTAZidime (FORTAZ) 2 g in dextrose 5 % 50 mL IVPB     2 g 100 mL/hr over 30 Minutes Intravenous Every M-W-F (Hemodialysis) 08/19/14 1217     08/19/14 1230  cefTAZidime (FORTAZ) 2 g in dextrose 5 % 50 mL IVPB     2 g 100 mL/hr over 30 Minutes Intravenous  Once 08/19/14 1217 08/19/14 1420   08/18/14 1200  vancomycin (VANCOCIN) IVPB 1000 mg/200 mL premix  Status:  Discontinued     1,000 mg 200 mL/hr over 60 Minutes Intravenous Every T-Th-Sa (Hemodialysis) 08/17/14 0836 08/19/14 1217   08/16/14 1200  vancomycin (VANCOCIN) IVPB 1000 mg/200 mL premix     1,000 mg 200 mL/hr over 60 Minutes Intravenous  Once 08/16/14 1145 08/16/14 1330   08/14/14 0945  vancomycin (VANCOCIN) IVPB 1000 mg/200 mL premix     1,000 mg 200 mL/hr over 60 Minutes Intravenous  Once 08/14/14 0933 08/14/14 1306   08/11/14 2000  vancomycin (VANCOCIN) 2,000 mg in sodium chloride 0.9 % 500 mL IVPB     2,000 mg 250 mL/hr over 120 Minutes Intravenous  Once 08/11/14 1803 08/11/14 2343   08/10/14 1700  piperacillin-tazobactam (ZOSYN) IVPB 2.25 g  Status:  Discontinued     2.25 g 100 mL/hr over 30 Minutes Intravenous 3 times per day 08/10/14 1646 08/12/14 1054      Objective:  Filed Vitals:   08/20/14 0008 08/20/14 0400 08/20/14 0728 08/20/14 1122  BP: 108/65 120/66 124/62   Pulse: 85 78 71   Temp: 98.2 F (36.8 C) 98.1 F (36.7 C) 97.7 F (36.5 C) 97.8 F (36.6 C)  TempSrc: Oral  Oral Oral  Resp: 12 13 16    Height:      Weight:  115.849 kg (255 lb 6.4 oz)    SpO2: 98% 100% 99%     Intake/Output Summary (Last 24 hours) at 08/20/14 1130 Last data filed at 08/20/14 0747  Gross per 24 hour  Intake    660 ml  Output      0 ml  Net    660 ml   Filed Weights   08/18/14 1300 08/19/14 0400 08/20/14 0400  Weight: 113 kg (249 lb 1.9 oz) 113.172 kg (249 lb 8 oz) 115.849 kg (255 lb 6.4 oz)    Exam:   General:  Alert not indistress  Cardiovascular: s1s2  Respiratory: diminished air entry at  bases  Abdomen: soft non tender non distended bowel sounds heard  Musculoskeletal: pedal edema present  L thigh - incision site and a bandage, mild surrounding erythema and induration, no physical signs of compartment syndrome in the left thigh  Data Reviewed: Basic Metabolic Panel:  Recent Labs Lab 08/14/14 1220 08/16/14 0545 08/17/14 0615 08/18/14 0400 08/19/14 0400  NA 137 135 136 135 135  K 4.4 4.6 4.0 4.2 4.2  CL 99 97 98 97 98  CO2 25 22 26 23 25   GLUCOSE 82 70 85 88 95  BUN 31* 49* 31* 44* 32*  CREATININE 6.00* 9.10* 7.12* 9.00*  6.98*  CALCIUM 9.2 7.6* 7.6* 7.4* 7.5*  PHOS 4.5  --   --   --   --    Liver Function Tests:  Recent Labs Lab 08/14/14 0700 08/14/14 1220 08/16/14 0545 08/17/14 0615 08/18/14 0400 08/19/14 0400  AST 1774*  --  1498* 1128* 869* 712*  ALT 318*  --  150* 92* 64* 53  ALKPHOS 219*  --  150* 134* 116 114  BILITOT 1.1  --  0.7 0.7 0.9 0.7  PROT 6.8  --  6.6 6.8 6.5 7.1  ALBUMIN 2.1* 2.3* 2.1* 2.1* 2.1* 2.2*   No results for input(s): LIPASE, AMYLASE in the last 168 hours. No results for input(s): AMMONIA in the last 168 hours. CBC:  Recent Labs Lab 08/14/14 1220 08/16/14 0545 08/17/14 0615 08/18/14 0900 08/19/14 0400  WBC 13.7* 18.6* 14.0* 16.2* 13.7*  HGB 12.2* 10.5* 9.5* 9.7* 9.7*  HCT 36.8* 32.8* 30.2* 30.6* 31.1*  MCV 94.8 96.8 98.1 98.7 99.4  PLT 271 282 259 272 196   Cardiac Enzymes:  Recent Labs Lab 08/14/14 0700 08/16/14 0545 08/17/14 0615 08/18/14 0400 08/19/14 0400  CKTOTAL 40981* 9482* 6751* 5047* 3442*   BNP (last 3 results) No results for input(s): BNP in the last 8760 hours.  ProBNP (last 3 results) No results for input(s): PROBNP in the last 8760 hours.  CBG:  Recent Labs Lab 08/19/14 0738 08/19/14 1153 08/19/14 1655 08/19/14 2119 08/20/14 0723  GLUCAP 78 77 84 88 84    Recent Results (from the past 240 hour(s))  MRSA PCR Screening     Status: None   Collection Time: 08/10/14  5:33 PM   Result Value Ref Range Status   MRSA by PCR NEGATIVE NEGATIVE Final    Comment:        The GeneXpert MRSA Assay (FDA approved for NASAL specimens only), is one component of a comprehensive MRSA colonization surveillance program. It is not intended to diagnose MRSA infection nor to guide or monitor treatment for MRSA infections.   Culture, blood (routine x 2)     Status: None   Collection Time: 08/10/14  6:08 PM  Result Value Ref Range Status   Specimen Description BLOOD LEFT ANTECUBITAL  Final   Special Requests   Final    BOTTLES DRAWN AEROBIC AND ANAEROBIC 10 CC BLUE 8CC RED   Culture   Final    NO GROWTH 5 DAYS Performed at [facility]    Report Status 08/17/2014 FINAL  Final  Culture, blood (routine x 2)     Status: None   Collection Time: 08/10/14  6:18 PM  Result Value Ref Range Status   Specimen Description BLOOD RIGHT HAND  Final   Special Requests BOTTLES DRAWN AEROBIC ONLY 8 CC  Final   Culture   Final    NO GROWTH 5 DAYS Performed at [facility]    Report Status 08/17/2014 FINAL  Final  Clostridium Difficile by PCR     Status: None   Collection Time: 08/13/14  1:35 AM  Result Value Ref Range Status   C difficile by pcr NEGATIVE NEGATIVE Final  Anaerobic culture     Status: None   Collection Time: 08/13/14 12:46 PM  Result Value Ref Range Status   Specimen Description WOUND LEFT GRAFT  Final   Special Requests POF ANCEF  Final   Gram Stain   Final    NO WBC SEEN NO SQUAMOUS EPITHELIAL CELLS SEEN NO ORGANISMS SEEN Performed at [facility]    Culture   Final    NO ANAEROBES ISOLATED Performed at [facility]    Report Status 08/18/2014 FINAL  Final  Wound culture     Status: None   Collection Time: 08/13/14 12:46 PM  Result Value Ref Range Status   Specimen Description WOUND LEFT GRAFT  Final   Special Requests POF ANCEF  Final   Gram Stain   Final    NO WBC SEEN NO SQUAMOUS EPITHELIAL CELLS SEEN NO ORGANISMS  SEEN Performed at [facility]    Culture   Final    NO GROWTH 2 DAYS Performed at [facility]    Report Status 08/15/2014 FINAL  Final  Wound culture     Status: None (Preliminary result)   Collection Time: 08/19/14 10:20 AM  Result Value Ref Range Status   Specimen Description HEMODIALYSIS GRAFT  Final   Special Requests Normal  Final   Gram Stain   Final    RARE WBC PRESENT, PREDOMINANTLY PMN NO SQUAMOUS EPITHELIAL CELLS SEEN NO ORGANISMS SEEN Performed at [facility]    Culture PENDING  Incomplete   Report Status PENDING  Incomplete     Studies: No results found.  Scheduled Meds: . antiseptic oral rinse  7 mL Mouth Rinse BID  . aspirin EC  325 mg Oral Daily  . cefTAZidime (FORTAZ)  IV  2 g Intravenous Q M,W,F-HD  . heparin  5,000 Units Subcutaneous 3 times per day  . insulin aspart  0-9 Units Subcutaneous TID WC  . latanoprost  1 drop Both Eyes QHS  . morphine  30 mg Oral Q12H  . multivitamin  1 tablet Oral QHS  . vancomycin  1,000 mg Intravenous Q M,W,F-HD   Continuous Infusions: . sodium chloride 10 mL/hr at 08/13/14 1033  . sodium chloride 10 mL/hr at 08/16/14 1610    Principal Problem:   Sepsis associated hypotension Active Problems:   End-stage renal disease on hemodialysis   History of placement of stent in LAD coronary artery   HTN (hypertension)   Radicular leg pain   Right-sided low back pain with right-sided sciatica   Elevated troponin   Abnormal transaminases   Metabolic encephalopathy   Chest pain   Shock circulatory   Pain in the chest   Weakness   Abnormal EKG   Abnormal liver function test   Lumbago   Diabetic foot ulcer   Myositis   NSTEMI (non-ST elevated myocardial infarction)   Thigh pain   Infection and inflammatory reaction due to cardiac device, implant, and graft   Back pain    Time spent: 25 minutes    SINGH,PRASHANT K  [facility] Pager [phone] If 7PM-7AM, please contact  night-coverage at [email or web address], password [facility] 08/20/2014, 11:30 AM  LOS: 10 days

## 2014-08-20 NOTE — Procedures (Signed)
Patient seen on Hemodialysis. QB 400, UF goal 1.5L Treatment adjusted as needed.  Zetta Bills MD [facility]. Office # [phone] Pager # [phone] 3:26 PM

## 2014-08-20 NOTE — Progress Notes (Addendum)
   [facility] [place]  Subjective  - Doing fine over all no drainage today from thigh.   Objective 124/62 71 97.7 F (36.5 C) (Oral) 16 99%  Intake/Output Summary (Last 24 hours) at 08/20/14 0900 Last data filed at 08/20/14 0747  Gross per 24 hour  Intake    660 ml  Output      0 ml  Net    660 ml    Left thigh graft with palpable thrill No active drainage today Medial incisions healing well  Assessment/Planning: POD # 7 L thigh AVG revision, near end-access, L thigh abscess Per Dr. Nicky Pugh note Consensus appears to be to leave L thigh AVG and try to sterilize the graft He will need chronic abx suppression. Because he is out of dialysis options. F/U with Dr. Darrick Penna PRN    Thomasena Edis, EMMA [facility] 08/20/2014 9:00 AM --  Thigh graft looks good. No erythema or drainage today. Agree with above will recheck again in a few days  Fabienne Bruns, MD [facility] [place] Office: [phone] Pager: [phone]  Laboratory Lab Results:  Recent Labs  08/18/14 0900 08/19/14 0400  WBC 16.2* 13.7*  HGB 9.7* 9.7*  HCT 30.6* 31.1*  PLT 272 196   BMET  Recent Labs  08/18/14 0400 08/19/14 0400  NA 135 135  K 4.2 4.2  CL 97 98  CO2 23 25  GLUCOSE 88 95  BUN 44* 32*  CREATININE 9.00* 6.98*  CALCIUM 7.4* 7.5*    COAG Lab Results  Component Value Date   INR 1.08 08/13/2014   INR 1.09 08/11/2014   INR 1.10 08/10/2014   No results found for: PTT

## 2014-08-20 NOTE — Progress Notes (Signed)
Patient ID: [name], male   DOB: Dec 11, 1955, 59 y.o.   MRN: [id number]  [facility] Progress Note   Assessment/ Plan:   1. 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 (All efforts geared towards salvage of current access as this appears to be his last option). 2.ESRD scheduled for hemodialysis today, no acute needs 3. 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 Nutrition: Continue to monitor on renal diet/nutritional supplement status post infection/surgery 6. Hypertension: Blood pressures appear to be well controlled, continue to monitor  Subjective:   Reports to be doing well and states that he'll feel better when able to ambulate    Objective:   BP 124/62 mmHg  Pulse 71  Temp(Src) 97.7 F (36.5 C) (Oral)  Resp 16  Ht 5\' 11"  (1.803 m)  Wt 115.849 kg (255 lb 6.4 oz)  BMI 35.64 kg/m2  SpO2 99%  Physical Exam: Gen: Comfortably resting in bed CVS: Pulse regular in rate and rhythm, S1 and S2 normal Resp: Coarse breath sounds bilaterally, no rales Abd: Soft, obese, nontender Ext: Left thigh arteriovenous graft site with small punctate opening and serosanguineous drainage  Labs: BMET  Recent Labs Lab 08/14/14 0700 08/14/14 1220 08/16/14 0545 08/17/14 0615 08/18/14 0400 08/19/14 0400  NA 134* 137 135 136 135 135  K 5.0 4.4 4.6 4.0 4.2 4.2  CL 94* 99 97 98 97 98  CO2 23 25 22 26 23 25   GLUCOSE 101* 82 70 85 88 95  BUN 67* 31* 49* 31* 44* 32*  CREATININE 10.61* 6.00* 9.10* 7.12* 9.00* 6.98*  CALCIUM 7.6* 9.2 7.6* 7.6* 7.4* 7.5*  PHOS  --  4.5  --   --   --   --    CBC  Recent Labs Lab 08/16/14 0545 08/17/14 0615 08/18/14 0900 08/19/14 0400  WBC 18.6* 14.0* 16.2* 13.7*  HGB 10.5* 9.5*  9.7* 9.7*  HCT 32.8* 30.2* 30.6* 31.1*  MCV 96.8 98.1 98.7 99.4  PLT 282 259 272 196   Medications:    . antiseptic oral rinse  7 mL Mouth Rinse BID  . aspirin EC  325 mg Oral Daily  . cefTAZidime (FORTAZ)  IV  2 g Intravenous Q M,W,F-HD  . heparin  5,000 Units Subcutaneous 3 times per day  . insulin aspart  0-9 Units Subcutaneous TID WC  . latanoprost  1 drop Both Eyes QHS  . morphine  30 mg Oral Q12H  . multivitamin  1 tablet Oral QHS  . vancomycin  1,000 mg Intravenous Q M,W,F-HD   [name] Bills, MD 08/20/2014, 9:38 AM

## 2014-08-21 LAB — GLUCOSE, CAPILLARY
GLUCOSE-CAPILLARY: 77 mg/dL (ref 70–99)
Glucose-Capillary: 77 mg/dL (ref 70–99)
Glucose-Capillary: 87 mg/dL (ref 70–99)

## 2014-08-21 LAB — HEPATITIS C GENOTYPE

## 2014-08-21 MED ORDER — DEXTROSE 5 % IV SOLN: INTRAVENOUS | Status: DC

## 2014-08-21 MED ORDER — OXYCODONE HCL 5 MG PO TABS: 5.0000 mg | ORAL_TABLET | ORAL | Status: DC | PRN

## 2014-08-21 MED ORDER — LORAZEPAM 0.5 MG PO TABS: 0.5000 mg | ORAL_TABLET | Freq: Three times a day (TID) | ORAL | Status: DC | PRN

## 2014-08-21 MED ORDER — FENTANYL 50 MCG/HR TD PT72: 50.0000 ug | MEDICATED_PATCH | TRANSDERMAL | Status: DC

## 2014-08-21 NOTE — Discharge Summary (Addendum)
[name], is a 59 y.o. male  DOB 1955-07-17  MRN [id number].  Admission date:  08/10/2014  Admitting Physician  Rhetta Mura, MD  Discharge Date:  08/23/2014   Primary MD  Dyke Maes, MD  Recommendations for primary care physician for things to follow:   Monitor CBC, CK, CMP. Needs one-time follow-up with Gen. surgery for gallstones the next 3-4 weeks.  Also outpatient follow-up with within 1-2 weeks with subspecialist outline below   Admission Diagnosis  Cough [R05] Back pain [M54.9] Abnormal EKG [R94.31] Transaminitis [R74.0] NSTEMI (non-ST elevated myocardial infarction) [I21.4] Bilateral low back pain, with sciatica presence unspecified [M54.5]   Discharge Diagnosis  Cough [R05] Back pain [M54.9] Abnormal EKG [R94.31] Transaminitis [R74.0] NSTEMI (non-ST elevated myocardial infarction) [I21.4] Bilateral low back pain, with sciatica presence unspecified [M54.5]    Principal Problem:   Sepsis associated hypotension Active Problems:   End-stage renal disease on hemodialysis   History of placement of stent in LAD coronary artery   HTN (hypertension)   Radicular leg pain   Right-sided low back pain with right-sided sciatica   Elevated troponin   Abnormal transaminases   Metabolic encephalopathy   Chest pain   Shock circulatory   Pain in the chest   Weakness   Abnormal EKG   Abnormal liver function test   Lumbago   Diabetic foot ulcer   Myositis   NSTEMI (non-ST elevated myocardial infarction)   Thigh pain   Infection and inflammatory reaction due to cardiac device, implant, and graft   Back pain      Past Medical History  Diagnosis Date  . Anxiety   . Back pain   . GERD (gastroesophageal reflux disease)   . Peripheral neuropathy   . Arthritis   . Chronic diastolic  CHF (congestive heart failure)     a. 04/2013 Echo: EF nl, no rwma, mild LVH.  Marland Kitchen Active smoker   . ESRD on hemodialysis     [name] 4 days per week on M-Tu-Wed and Fri.  Started HD in 1998 and has been on HD initially at [facility], then went to [facility], then in [facility], then to [facility] and now is at [facility] for last 10 years.  Has L thigh AVG.     . Diabetes mellitus     diet controlled  . 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 PONV (postoperative nausea and vomiting)   . Hypertension   . Bell palsy   . Carpal tunnel syndrome     bilateral  . Palpitations   . Headache(784.0)     Hx: of Migraines  . CAD (coronary artery disease)     a. 04/2013 Ant STEMI/PCI: LM nl, LAD 95p (3.0x18 Xience DES), D1/2/3 small, LCX 80ost, RI 20p, RCA 110m CTO (unsuccessful PCI), EF 55%, mod basal inf HK.  . S/P coronary artery stent placement 05/21/2013    Xience Alpine Medtronic conditional 5 @ 1.5 and 3T  Past Surgical History  Procedure Laterality Date  . Total hip arthroplasty    . Thyroidectomy    . Tooth extraction    . Mandible fracture surgery    . Dg av dialysis graft declot or    . Insertion of dialysis catheter  03/28/2011    Procedure: INSERTION OF DIALYSIS CATHETER;  Surgeon: Pryor Ochoa, MD;  Location: [facility] OR;  Service: Vascular;  Laterality: Left;  . Av fistula placement  03/31/2011    Procedure: INSERTION OF ARTERIOVENOUS (AV) GORE-TEX GRAFT THIGH;  Surgeon: Sherren Kerns, MD;  Location: [facility] OR;  Service: Vascular;  Laterality: Right;  redo right thigh arteriovenous gortex graft using gore-tex stretch 6mm x 70cm  . Thrombectomy w/ embolectomy  06/02/2011    Procedure: THROMBECTOMY ARTERIOVENOUS GORE-TEX GRAFT;  Surgeon: Chuck Hint, MD;  Location: [facility] OR;  Service: Vascular;  Laterality: Right;  Thrombectomy right thigh arteriovenous gortex graft;  revision by  replacement of large portion of graft with 7mm gore-tex   . Insertion of dialysis  catheter  08/28/2011    Procedure: INSERTION OF DIALYSIS CATHETER;  Surgeon: Fransisco Hertz, MD;  Location: [facility] OR;  Service: Vascular;  Laterality: Left;  Atempted Bilateral Internal Jugular, Bilateral Subclavin insertion of 55cm Dialysis Catheter Left Femoral  . Insertion of dialysis catheter  12/15/2011    Procedure: INSERTION OF DIALYSIS CATHETER;  Surgeon: Sherren Kerns, MD;  Location: [facility] OR;  Service: Vascular;  Laterality: Right;  Insertion of Right Femoral Dialysis Catheter  . Exchange of a dialysis catheter  02/26/2012    Procedure: EXCHANGE OF A DIALYSIS CATHETER;  Surgeon: Larina Earthly, MD;  Location: [facility] OR;  Service: Vascular;  Laterality: Right;  . Joint replacement    . Exchange of a dialysis catheter  05/18/2012    Procedure: EXCHANGE OF A DIALYSIS CATHETER;  Surgeon: Larina Earthly, MD;  Location: [facility] OR;  Service: Vascular;  Laterality: Right;  right femoral dialysis catheter  . Av fistula placement  05/18/2012    Procedure: INSERTION OF ARTERIOVENOUS (AV) GORE-TEX GRAFT THIGH;  Surgeon: Larina Earthly, MD;  Location: [facility] OR;  Service: Vascular;  Laterality: Left;  using 6mm by 70cm goretex graft  . Thrombectomy w/ embolectomy Left 08/25/2012    Procedure: THROMBECTOMY ARTERIOVENOUS GORE-TEX GRAFT;  Surgeon: Pryor Ochoa, MD;  Location: [facility] OR;  Service: Vascular;  Laterality: Left;  Attempted thrombectomy of left thigh arteriovenous gortex graft.   . Av fistula placement Left 08/25/2012    Procedure: INSERTION OF ARTERIOVENOUS (AV) GORE-TEX GRAFT THIGH;  Surgeon: Pryor Ochoa, MD;  Location: [facility] OR;  Service: Vascular;  Laterality: Left;  Using 6mm x 40cm vascular Gortex graft.  . Revision of arteriovenous goretex graft Left 12/14/2012    Procedure: Revision of Left Thigh Graft;  Surgeon: Larina Earthly, MD;  Location: [facility] OR;  Service: Vascular;  Laterality: Left;  . Avgg removal Left 02/16/2013    Procedure: EXCISION OF LEFT ARM ARTERIOVENOUS GORETEX GRAFT TIMES 2 WITH VEIN PATCH ANGIOPLASTY OF  BRACIAL ARTERY.  ;  Surgeon: Chuck Hint, MD;  Location: [facility] OR;  Service: Vascular;  Laterality: Left;  Converted from MAC to General.    . Revision of arteriovenous goretex graft Left 08/08/2013    Procedure: REVISION OF LEFT THIGH ARTERIOVENOUS GORETEX GRAFT;  Surgeon: Sherren Kerns, MD;  Location: [facility] OR;  Service: Vascular;  Laterality: Left;  . Venogram Bilateral 10/27/2011    Procedure: VENOGRAM;  Surgeon: Nada Libman,  MD;  Location: [facility] CATH LAB;  Service: Cardiovascular;  Laterality: Bilateral;  bilat upper extrem venograms  . Left heart cath Bilateral 05/21/2013    Procedure: LEFT HEART CATH;  Surgeon: Iran Ouch, MD;  Location: [facility] CATH LAB;  Service: Cardiovascular;  Laterality: Bilateral;  . Percutaneous coronary stent intervention (pci-s)  05/21/2013    Procedure: PERCUTANEOUS CORONARY STENT INTERVENTION (PCI-S);  Surgeon: Iran Ouch, MD;  Location: [facility] CATH LAB;  Service: Cardiovascular;;  . Coronary angioplasty with stent placement    . Radiology with anesthesia N/A 06/27/2014    Procedure: MRI LUMBER WITHOUT CONTRAST;  Surgeon: Medication Radiologist, MD;  Location: [facility] OR;  Service: Radiology;  Laterality: N/A;  . Revision of arteriovenous goretex graft Left 08/13/2014    Procedure: REVISION OF ARTERIOVENOUS GORETEX GRAFT LEFT THIGH;  Surgeon: Sherren Kerns, MD;  Location: [facility] OR;  Service: Vascular;  Laterality: Left;  . Radiology with anesthesia N/A 08/16/2014    Procedure: MRI;  Surgeon: Medication Radiologist, MD;  Location: [facility] OR;  Service: Radiology;  Laterality: N/A;       History of present illness and  Hospital Course:     Kindly see H&P for history of present illness and admission details, please review complete Labs, Consult reports and Test reports for all details in brief  HPI  from the history and physical done on the day of admission  51- year ol Male with multiple medical Problems including chronic diastolic heart failure, ESRD on HD 4  times a week, diet controlled DM, h/o hepatitis C, hypertension, CAD s/p PCI, admitted for chest pain, back pain. On admission he was found to be hypotensive. Eventual workup showed possible left groin dialysis graft site abscess and infection. He was seen by vascular surgery, renal, ID. It was decided that since this is his last dialysis access site we will try to salvage this with suppressive antibiotic treatment.   Hospital Course    Sepsis and bleeding from L thigh HD graft (abscess) with Leukocytosis with hypotension: Had revision of the left thigh AV graft on 4/19. MRI of the left thigh on 08/16/2014 noted with small Abscess, VVS and ID saw the patient.   Currently consensus to salvage the graft site as this appears to be that this is the last graft site for this patient, he has been treated with IV vancomycin and his leukocytosis is improving and overall abscess site seems to be gradually improving as well. Per ID he will be switched to IV Fortaz on 08/20/2014 for 4-6 weeks with close outpatient ID follow-up and then possibly placed on oral Augmentin chronically for suppressive treatment.  No other obvious source of infection Chest x-ray unremarkable, right upper quadrant ultrasound nonacute, stable TTE. Marland Kitchen   Note patient also developed clot office right AV graft on 08/21/2014 evening, he was taken to the OR briefly on 08/22/2014 by vascular surgeon Dr. Karlene Lineman for declotting procedure which was successful. He was subsequently dialyzed this morning on 08/23/2014.   Transaminitis and rhabdomyolysis probably from myositis: Elevated liver enzymes likely due to combination of hypotension with shock liver, rhabdomyolysis being on high-dose statin. Seen by GI. Does have history of hep C and remote history of hep B. No further GI workup per gastroenterology besides outpatient follow-up for HCV. Liver enzymes trending down serially. For gallstones one time outpatient follow-up with general  surgery   Rhabdomyolysis. Left thigh MRI under sedation ordered for 08/16/2014, was also on high-dose statin. Monitor trend of CK which continues to  improve daily. Home dose statin for CAD has been resumed upon discharge. Last CK is 3442 which is down from over 100,000. Repeat CK by PCP in [redacted] week along with CMP. If liver enzymes and CK trend up. Lipitor.   Hx of hepatitis C infection. Elevated viral load, will follow with GI outpatient along with ID.   Chest pain / abnormal EKG/ ELEVATED troponin's CAD s/p PCI: chest pain resolved. Underwent stress myoview. Results are unremarkable. Cardiology signed off. Resume Effient and aspirin along with other secondary prevention medications.   ESRD ON HD: Further management as per renal. HD 4 times a week. I'll await nephrology outpatient for dialysis as usual. Primary nephrologist is Dr. Briant Cedar.    Anemia:AOCD Mild and normocytic.    Diabetes Mellitus: CBG (last 3)   Recent Labs (last 2 labs)      Recent Labs  08/19/14 1655 08/19/14 2119 08/20/14 0723  GLUCAP 84 88 84     A1c 5.4 and Diet controlled DM.     Hypertension: controlled.    Chronic diastolic heart failure EF 55% : He appears to be compensated.    Chronic low back pain. MRI stable. Supportive care        Discharge Condition: Stable   Follow UP  Follow-up Information    Follow up with MATTINGLY,MICHAEL T, MD. Schedule an appointment as soon as possible for a visit in 1 week.   Specialty:  Nephrology   Why:  Dr. Briant Cedar will see you at your dialisys treatment   Contact information:   [address] [place] [address] [phone]       Follow up with Staci Righter, MD. Go on 09/26/2014.   Specialty:  Infectious Diseases   Why:  Left groin graft site infection, follow up @2 :00pm   Contact information:   [address] [place] [address] [phone]       Follow up with Sherren Kerns, MD. Go on 08/29/2014.   Specialty:   Vascular Surgery   Why:  at 9:15 A.M. for left groin graft site infection   Contact information:   [address] [place] [address] [phone]         Discharge Instructions  and  Discharge Medications          Discharge Instructions    Discharge instructions    Complete by:  As directed   Follow with Primary MD MATTINGLY,MICHAEL T, MD in 7 days   Get CBC, CMP, 2 view Chest X ray checked  by Primary MD next visit.    Activity: As tolerated with Full fall precautions use walker/cane & assistance as needed   Disposition SNF   Diet: Renal.  Check your Weight same time everyday, if you gain over 2 pounds, or you develop in leg swelling, experience more shortness of breath or chest pain, call your Primary MD immediately. Follow Cardiac Low Salt Diet and 1.2 lit/day fluid restriction.   On your next visit with your primary care physician please Get Medicines reviewed and adjusted.   Please request your Prim.MD to go over all Hospital Tests and Procedure/Radiological results at the follow up, please get all Hospital records sent to your Prim MD by signing hospital release before you go home.   If you experience worsening of your admission symptoms, develop shortness of breath, life threatening emergency, suicidal or homicidal thoughts you must seek medical attention immediately by calling 911 or calling your MD immediately  if symptoms less severe.  You Must  read complete instructions/literature along with all the possible adverse reactions/side effects for all the Medicines you take and that have been prescribed to you. Take any new Medicines after you have completely understood and accpet all the possible adverse reactions/side effects.   Do not drive, operating heavy machinery, perform activities at heights, swimming or participation in water activities or provide baby sitting services if your were admitted for syncope or siezures until you have seen by Primary MD or a  Neurologist and advised to do so again.  Do not drive when taking Pain medications.    Do not take more than prescribed Pain, Sleep and Anxiety Medications  Special Instructions: If you have smoked or chewed Tobacco  in the last 2 yrs please stop smoking, stop any regular Alcohol  and or any Recreational drug use.  Wear Seat belts while driving.   Please note  You were cared for by a hospitalist during your hospital stay. If you have any questions about your discharge medications or the care you received while you were in the hospital after you are discharged, you can call the unit and asked to speak with the hospitalist on call if the hospitalist that took care of you is not available. Once you are discharged, your primary care physician will handle any further medical issues. Please note that NO REFILLS for any discharge medications will be authorized once you are discharged, as it is imperative that you return to your primary care physician (or establish a relationship with a primary care physician if you do not have one) for your aftercare needs so that they can reassess your need for medications and monitor your lab values.     Increase activity slowly    Complete by:  As directed             Medication List    STOP taking these medications        naproxen sodium 220 MG tablet  Commonly known as:  ANAPROX      TAKE these medications        aspirin EC 81 MG tablet  Take 1 tablet (81 mg total) by mouth daily.     atorvastatin 80 MG tablet  Commonly known as:  LIPITOR  Take 1 tablet (80 mg total) by mouth daily at 6 PM.     B-complex with vitamin C tablet  Take 1 tablet by mouth at bedtime.     calcium carbonate 500 MG chewable tablet  Commonly known as:  TUMS - dosed in mg elemental calcium  Chew 3 tablets by mouth 3 (three) times daily.     cefTAZidime in dextrose 5 % 50 mL  For 5 weeks starting from 08/21/2014. To be stopped by ID physician Dr. Staci Righter      colchicine 0.6 MG tablet  Take 0.6 mg by mouth every 12 (twelve) hours as needed (gout).     dorzolamide-timolol 22.3-6.8 MG/ML ophthalmic solution  Commonly known as:  COSOPT  Place 1 drop into both eyes 3 (three) times daily.     escitalopram 10 MG tablet  Commonly known as:  LEXAPRO  Take 10 mg by mouth daily.     feeding supplement (PRO-STAT SUGAR FREE 64) Liqd  Take 30 mLs by mouth 2 (two) times daily.     fentaNYL 50 MCG/HR  Commonly known as:  DURAGESIC - dosed mcg/hr  Place 1 patch (50 mcg total) onto the skin every 3 (three) days. Apply at bedtime  gabapentin 100 MG capsule  Commonly known as:  NEURONTIN  Take 100 mg by mouth every 12 (twelve) hours as needed (neuropathy).     latanoprost 0.005 % ophthalmic solution  Commonly known as:  XALATAN  Place 1 drop into both eyes at bedtime.     loperamide 2 MG capsule  Commonly known as:  IMODIUM  Take 4 mg by mouth as needed for diarrhea or loose stools.     LORazepam 0.5 MG tablet  Commonly known as:  ATIVAN  Take 1 tablet (0.5 mg total) by mouth every 8 (eight) hours as needed for anxiety.     Menthol (Topical Analgesic) 4 % Gel  Apply 1 application topically every 6 (six) hours as needed (pain). Apply to shoulders/lower back     nitroGLYCERIN 0.4 MG SL tablet  Commonly known as:  NITROSTAT  Place 1 tablet (0.4 mg total) under the tongue every 5 (five) minutes as needed for chest pain.     omeprazole 40 MG capsule  Commonly known as:  PRILOSEC  Take 40 mg by mouth at bedtime.     oxyCODONE 5 MG immediate release tablet  Commonly known as:  Oxy IR/ROXICODONE  Take 1-2 tablets (5-10 mg total) by mouth every 4 (four) hours as needed for moderate pain.     prasugrel 10 MG Tabs tablet  Commonly known as:  EFFIENT  Take 1 tablet (10 mg total) by mouth daily.     sodium bicarbonate 650 MG tablet  Take 650 mg by mouth 2 (two) times daily.     sulindac 200 MG tablet  Commonly known as:  CLINORIL  Take 200 mg  by mouth 2 (two) times daily.          Diet and Activity recommendation: See Discharge Instructions above   Consults obtained -     Renal  Vascular  Cardiology   ID  GI   Major procedures and Radiology Reports - PLEASE review detailed and final reports for all details, in brief -    TTE  - Left ventricle: The cavity size was normal. Wall thickness wasincreased in a pattern of mild LVH. Systolic function was normal.The estimated ejection fraction was in the range of 55% to 60%.Wall motion was normal; there were no regional wall motionabnormalities. Left ventricular diastolic function parameterswere normal. - Mitral valve: Calcified annulus. - Right atrium: The atrium was mildly dilated.     Stress myoview  HD  Revision of the left thigh medial graft  Declotting of right AV graft on 08/22/2014 by Dr. early   Ct Abdomen Pelvis Wo Contrast  08/13/2014   CLINICAL DATA:  Abnormal liver function tests.  EXAM: CT ABDOMEN AND PELVIS WITHOUT CONTRAST  TECHNIQUE: Multidetector CT imaging of the abdomen and pelvis was performed following the standard protocol without IV contrast.  COMPARISON:  06/03/2010  FINDINGS: There is several tiny calcified granulomas in the liver. Liver parenchyma otherwise appears normal. Multiple stones in the gallbladder. No dilated bile ducts.  Spleen, pancreas, and adrenal glands are normal. Severe bilateral renal atrophy with numerous cysts in both kidneys consistent with chronic renal failure.  The bowel is normal except for a few diverticula in the ascending colon. Extensive arterial vascular calcification. No free air free fluid or adenopathy. There is a 4 cm fluid collection in the left inguinal region adjacent to the dialysis graft. This probably represents a seroma. Severe degenerative disc disease in the lower lumbar spine with severe spinal stenosis at L3-4 with a  broad-based disc protrusion asymmetric into the right neural foramen.   IMPRESSION: No acute abnormality. Granulomas in the liver. Cholelithiasis. Probable seroma in the left inguinal region.   Electronically Signed   By: Francene Boyers M.D.   On: 08/13/2014 07:33   Dg Chest 1 View  08/10/2014   CLINICAL DATA:  Chronic low back pain, now with hip pain  EXAM: CHEST  1 VIEW  COMPARISON:  06/19/2014  FINDINGS: Lungs are essentially clear. No focal consolidation. No pleural effusion or pneumothorax.  The heart is top-normal in size.  Surgical clips overlying the neck.  IMPRESSION: No evidence of acute cardiopulmonary disease.   Electronically Signed   By: Charline Bills M.D.   On: 08/10/2014 13:43   Mr Lumbar Spine Wo Contrast  08/16/2014   CLINICAL DATA:  Dialysis patient with history of hepatitis. Severe back pain. Wound of the left thigh. Question diskitis.  EXAM: MRI LUMBAR SPINE WITHOUT CONTRAST  TECHNIQUE: Multiplanar, multisequence MR imaging of the lumbar spine was performed. No intravenous contrast was administered.  COMPARISON:  MRI lumbar spine 06/27/2014 and 11/06/2011. CT abdomen and pelvis 08/12/2014.  FINDINGS: Bone marrow signal is markedly heterogeneous as seen on the prior examination. Scattered Schmorl's nodes are identified. No fluid within intervertebral disc spaces or marrow signal abnormality to suggest discitis or osteomyelitis is identified. The patient has a congenitally narrow central spinal canal. No fracture is identified. Trace anterolisthesis L3 on L4 is unchanged. Alignment is otherwise normal. Imaged intra-abdominal contents again demonstrate marked renal atrophy and extensive cyst formation.  The T11-12 and T12-L1 levels are imaged in the sagittal plane only and negative.  L1-2:  Negative.  L2-3: Shallow disc bulge is unchanged. The central canal and foramina appear open.  L3-4: Broad-based disc bulge, ligamentum flavum thickening and facet arthropathy cause severe central canal stenosis. Disc extends into the foramina causing severe bilateral  foraminal narrowing. The appearance is not markedly changed since the most recent exam but worsened since the 2013 study.  L4-5: There is a shallow central protrusion and mild facet degenerative disease. The central canal and foramina are mildly narrowed.  L5-S1: Shallow disc bulge without central canal stenosis. The foramina are mildly narrowed.  IMPRESSION: Negative for discitis or osteomyelitis.  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 The appearance is not markedly changed since the most recent exam.   Electronically Signed   By: Drusilla Kanner M.D.   On: 08/16/2014 11:10   US Abdomen Complete  08/11/2014   CLINICAL DATA:  Transaminitis  EXAM: ULTRASOUND ABDOMEN COMPLETE  COMPARISON:  None.  FINDINGS: Gallbladder: Multiple gallstones. Lung 7 mm adherent stone. No gallbladder wall thickening. No pericholecystic fluid.  Common bile duct: Diameter: 5.7 mm.  No evidence of a duct stone.  Liver: No focal lesion identified. Within normal limits in parenchymal echogenicity.  IVC: No abnormality visualized.  Pancreas: Limited visualization.  Portions seen are unremarkable.  Spleen: Size and appearance within normal limits.  Right Kidney: Length: 9.9 cm. Diffusely echogenic with diffuse cortical thinning. No hydronephrosis.  Left Kidney: Length: 9.9 cm. Diffusely echogenic with diffuse cortical thinning. No hydronephrosis. 2.6 cm lower pole cyst.  Abdominal aorta: No aneurysm visualized.  Other findings: None.  IMPRESSION: 1. No acute findings. 2. Cholelithiasis without evidence of acute cholecystitis. 3. End-stage renal disease with bilateral small echogenic kidneys. No hydronephrosis. Small left renal cyst. 4. Normal sonographic appearance of the liver.   Electronically Signed  By: Amie Portland M.D.   On: 08/11/2014 07:50   Mr Femur Left Wo Contrast  08/16/2014   CLINICAL DATA:  End-stage renal disease. Hepatitis-C. Total hip  arthroplasty. Drainage from the thigh. Deep infection of the LEFT thigh.  EXAM: MR OF THE LEFT FEMUR WITHOUT CONTRAST  TECHNIQUE: Multiplanar, multisequence MR imaging was performed. No intravenous contrast was administered.  COMPARISON:  CT 08/12/2014.  Radiographs 10/12/2013.  FINDINGS: There is a large amount of susceptibility artifact in the proximal LEFT femoral diaphysis associated with metallic foreign body identified on prior radiographs. There is no LEFT total hip arthroplasty. Severe LEFT hip osteoarthritis is present. Small LEFT hip effusion and degenerative synovitis. No convincing evidence of septic arthritis. No decubitus ulcers around the LEFT hip.  LEFT thigh AV fistula is present. Cystic lesion is present in the LEFT inguinal region measuring 40 mm x 20 mm on axial imaging. This probably represents a lymphocele or seroma. Ganglion cyst is unlikely given the location. Abscess is also unlikely given the lack of adjacent inflammatory change. Mild LEFT inguinal adenopathy is present.  Myositis is present in the semi tendinosis and biceps femoris of the LEFT thigh. Circumferential edema is present in the proximal LEFT thigh which is nonspecific but commonly associated with cellulitis. Superficial subcutaneous fluid is present along the lateral aspect of the LEFT thigh. This superficial subcutaneous fluid collection measures 5 cm AP, 9 mm thickest in the transverse dimension and 7.5 cm craniocaudal. Infiltrating subcutaneous edema is present more proximally. This subcutaneous lesion may represent a small abscess in a patient with a history of sepsis. The fluid overlies the midportion of the vastus lateralis muscle. No deep soft tissue infection is identified.  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 This has a slightly different distribution than in the contralateral LEFT side. No my necrosis or deep soft tissue abscess in the incidentally visualized LEFT thigh.  ORIF of the RIGHT proximal femur  is present with cannulated lag screws producing susceptibility artifact. 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  IMPRESSION: 1. Severe LEFT hip osteoarthritis. Cannulated lag screws in the RIGHT hip. 2. Susceptibility artifact associated with metallic foreign body in the proximal LEFT femoral diaphysis degrading the evaluation of the deep soft tissues in this region. 3. No deep soft tissue abscess. 4. Small thin lateral LEFT thigh subcutaneous fluid collection measuring only about 1 cm in thickness overlies the vastus lateralis. This is nonspecific but could represent a small abscess. 5. Diffuse subcutaneous edema of the LEFT thigh, which may represent cellulitis in the appropriate clinical setting. 6. Nonspecific myositis of the hamstrings bilaterally. In the setting of prior sepsis, this is most likely infectious.   Electronically Signed   By: Andreas Newport M.D.   On: 08/16/2014 11:15   Nm Myocar Multi W/spect W/wall Motion / Ef  08/11/2014   CLINICAL DATA:  59 year old male with a history of palpitations.  Cardiovascular risk factors include smoking, diabetes, hypertension, coronary artery disease.  EXAM: MYOCARDIAL IMAGING WITH SPECT (REST AND PHARMACOLOGIC-STRESS)  GATED LEFT VENTRICULAR WALL MOTION STUDY  LEFT VENTRICULAR EJECTION FRACTION  TECHNIQUE: Standard myocardial SPECT imaging was performed after resting intravenous injection of 10 mCi Tc-68m sestamibi. Subsequently, intravenous infusion of Lexiscan was performed under the supervision of the Cardiology staff. At peak effect of the drug, 30 mCi Tc-62m sestamibi was injected intravenously and standard myocardial SPECT imaging was performed. Quantitative gated imaging was also performed to evaluate left ventricular wall motion, and estimate left ventricular ejection  fraction.  COMPARISON:  None.  FINDINGS: Perfusion: No decreased activity in the left ventricle on stress imaging to suggest reversible ischemia or infarction.  Wall Motion:  Hypokinesia of the inferior septal wall.  Left Ventricular Ejection Fraction: 52 %  End diastolic volume 129 ml  End systolic volume 62 ml  IMPRESSION: 1. No reversible ischemia or infarction.  2. Hypokinesia of the inferior septal wall.  3. Left ventricular ejection fraction 52%  4. Low-risk stress test findings*.  *2012 Appropriate Use Criteria for Coronary Revascularization Focused Update: J Am Coll Cardiol. 2012;59(9):857-881. http://content.dementiazones.com.aspx?articleid=1201161   Electronically Signed   By: Gilmer Mor D.O.   On: 08/11/2014 13:52   Dg Chest Port 1 View  08/16/2014   CLINICAL DATA:  Shortness of breath for 1 day.  EXAM: PORTABLE CHEST - 1 VIEW  COMPARISON:  08/10/2014.  FINDINGS: Stable enlarged cardiac silhouette. Interval mild prominence of the pulmonary vasculature. Stable mildly prominent interstitial markings. No pleural fluid. Thoracic spine degenerative changes. Bilateral neck surgical clips.  IMPRESSION: 1. Interval mild pulmonary vascular congestion with stable cardiomegaly. 2. Stable mild chronic interstitial lung disease.   Electronically Signed   By: Beckie Salts M.D.   On: 08/16/2014 20:15   Dg Foot Complete Left  08/12/2014   CLINICAL DATA:  Pt has a diabetic foot ulcer on the plantar surface of his 1st toe. Pt was unable to move foot or hold foot in position  EXAM: LEFT FOOT - COMPLETE 3+ VIEW  COMPARISON:  None.  FINDINGS: No fracture. Joints are normally aligned. There are no areas of bone resorption to suggest osteomyelitis.  Vascular calcifications are noted. Soft tissues otherwise unremarkable.  IMPRESSION: 1. No fracture.  No evidence of osteomyelitis.   Electronically Signed   By: Amie Portland M.D.   On: 08/12/2014 14:44   US Abdomen Limited Ruq  08/17/2014   CLINICAL DATA:  Fever, increased WBC, increased LFT  EXAM: US ABDOMEN LIMITED - RIGHT UPPER QUADRANT  COMPARISON:  None.  FINDINGS: Gallbladder:  Multiple gallstones are noted within gallbladder  largest measures 6 mm. No thickening of gallbladder wall. No sonographic Murphy's sign  Common bile duct:  Diameter: 5.6 mm in diameter within normal limits  Liver:  No focal lesion identified. Within normal limits in parenchymal echogenicity.  IMPRESSION: 1. Multiple gallstones are noted within gallbladder. 2. No thickening of gallbladder wall.  Normal CBD.   Electronically Signed   By: Natasha Mead M.D.   On: 08/17/2014 08:03    Micro Results      Recent Results (from the past 240 hour(s))  Anaerobic culture     Status: None   Collection Time: 08/13/14 12:46 PM  Result Value Ref Range Status   Specimen Description WOUND LEFT GRAFT  Final   Special Requests POF ANCEF  Final   Gram Stain   Final    NO WBC SEEN NO SQUAMOUS EPITHELIAL CELLS SEEN NO ORGANISMS SEEN Performed at [facility]    Culture   Final    NO ANAEROBES ISOLATED Performed at [facility]    Report Status 08/18/2014 FINAL  Final  Wound culture     Status: None   Collection Time: 08/13/14 12:46 PM  Result Value Ref Range Status   Specimen Description WOUND LEFT GRAFT  Final   Special Requests POF ANCEF  Final   Gram Stain   Final    NO WBC SEEN NO SQUAMOUS EPITHELIAL CELLS SEEN NO ORGANISMS SEEN Performed at [facility]  Culture   Final    NO GROWTH 2 DAYS Performed at [facility]    Report Status 08/15/2014 FINAL  Final  Wound culture     Status: None   Collection Time: 08/19/14 10:20 AM  Result Value Ref Range Status   Specimen Description HEMODIALYSIS GRAFT  Final   Special Requests Normal  Final   Gram Stain   Final    RARE WBC PRESENT, PREDOMINANTLY PMN NO SQUAMOUS EPITHELIAL CELLS SEEN NO ORGANISMS SEEN Performed at [facility]    Culture   Final    NO GROWTH 2 DAYS Performed at [facility]    Report Status 08/22/2014 FINAL  Final       Today   Subjective:   Render Marley today has no headache,no chest abdominal pain,no new  weakness tingling or numbness, feels much better.  Objective:   Blood pressure 112/68, pulse 76, temperature 97 F (36.1 C), temperature source Oral, resp. rate 18, height  (1.803 m), weight 116.5 kg (256 lb 13.4 oz), SpO2 100 %.   Intake/Output Summary (Last 24 hours) at 08/23/14 1006 Last data filed at 08/23/14 1610  Gross per 24 hour  Intake   1100 ml  Output      0 ml  Net   1100 ml    Exam Awake Alert, Oriented x 3, No new F.N deficits, Normal affect [place].AT,PERRAL Supple Neck,No JVD, No cervical lymphadenopathy appriciated.  Symmetrical Chest wall movement, Good air movement bilaterally, CTAB RRR,No Gallops,Rubs or new Murmurs, No Parasternal Heave +ve B.Sounds, Abd Soft, Non tender, No organomegaly appriciated, No rebound -guarding or rigidity. No Cyanosis, Clubbing or edema, No new Rash or bruise  Data Review   CBC w Diff:  Lab Results  Component Value Date   WBC 15.0* 08/22/2014   HGB 9.0* 08/22/2014   HCT 27.7* 08/22/2014   PLT 213 08/22/2014   LYMPHOPCT 3* 08/10/2014   MONOPCT 2* 08/10/2014   EOSPCT 4 08/10/2014   BASOPCT 0 08/10/2014    CMP:  Lab Results  Component Value Date   NA 133* 08/22/2014   K 4.9 08/22/2014   CL 94* 08/22/2014   CO2 24 08/22/2014   BUN 54* 08/22/2014   CREATININE 11.26* 08/22/2014   PROT 7.1 08/19/2014   ALBUMIN 2.2* 08/19/2014   BILITOT 0.7 08/19/2014   ALKPHOS 114 08/19/2014   AST 712* 08/19/2014   ALT 53 08/19/2014  .   Total Time in preparing paper work, data evaluation and todays exam - 35 minutes  Leroy Sea M.D on 08/23/2014 at 10:06 AM  [facility]   Office  [phone]

## 2014-08-21 NOTE — Discharge Instructions (Signed)
Follow with Primary MD MATTINGLY,MICHAEL T, MD in 7 days   Get CBC, CMP, 2 view Chest X ray checked  by Primary MD next visit.    Activity: As tolerated with Full fall precautions use walker/cane & assistance as needed   Disposition SNF   Diet: Renal.  Check your Weight same time everyday, if you gain over 2 pounds, or you develop in leg swelling, experience more shortness of breath or chest pain, call your Primary MD immediately. Follow Cardiac Low Salt Diet and 1.2 lit/day fluid restriction.   On your next visit with your primary care physician please Get Medicines reviewed and adjusted.   Please request your Prim.MD to go over all Hospital Tests and Procedure/Radiological results at the follow up, please get all Hospital records sent to your Prim MD by signing hospital release before you go home.   If you experience worsening of your admission symptoms, develop shortness of breath, life threatening emergency, suicidal or homicidal thoughts you must seek medical attention immediately by calling 911 or calling your MD immediately  if symptoms less severe.  You Must read complete instructions/literature along with all the possible adverse reactions/side effects for all the Medicines you take and that have been prescribed to you. Take any new Medicines after you have completely understood and accpet all the possible adverse reactions/side effects.   Do not drive, operating heavy machinery, perform activities at heights, swimming or participation in water activities or provide baby sitting services if your were admitted for syncope or siezures until you have seen by Primary MD or a Neurologist and advised to do so again.  Do not drive when taking Pain medications.    Do not take more than prescribed Pain, Sleep and Anxiety Medications  Special Instructions: If you have smoked or chewed Tobacco  in the last 2 yrs please stop smoking, stop any regular Alcohol  and or any Recreational drug  use.  Wear Seat belts while driving.   Please note  You were cared for by a hospitalist during your hospital stay. If you have any questions about your discharge medications or the care you received while you were in the hospital after you are discharged, you can call the unit and asked to speak with the hospitalist on call if the hospitalist that took care of you is not available. Once you are discharged, your primary care physician will handle any further medical issues. Please note that NO REFILLS for any discharge medications will be authorized once you are discharged, as it is imperative that you return to your primary care physician (or establish a relationship with a primary care physician if you do not have one) for your aftercare needs so that they can reassess your need for medications and monitor your lab values.

## 2014-08-21 NOTE — Progress Notes (Addendum)
CSW (Clinical Child psychotherapist) prepared pt dc packet and placed with shadow chart. Pt nurse to call for non-emergency ambulance when ready. Pt and facility informed. CSW signing off.   ADDENDUM: CSW notified of potential delay/cancellation of discharge. CSW spoke with facility and they confirmed they can still accept pt later tonight since dc summary has been received. CSW notified pt nurse and provided with after hours transportation number. Pt nurse to call for transport if pt will still dc today.   Jayli Fogleman, LCSWA [phone]

## 2014-08-21 NOTE — Progress Notes (Signed)
Physical Therapy Treatment Patient Details Name: [name] MRN: [id number] DOB: 02/06/1956 Today's Date: 2014/09/19    History of Present Illness Pt adm with sepsis and bleeding from lt thigh graft. Work-up for possible graft infection in progress. PMH - severe chronic back pain, ESRD on HD, HTN, DM    PT Comments    Pt making slow progress.  Follow Up Recommendations  SNF     Equipment Recommendations  None recommended by PT    Recommendations for Other Services       Precautions / Restrictions Precautions Precautions: Fall Restrictions Weight Bearing Restrictions: No    Mobility  Bed Mobility Overal bed mobility: Needs Assistance Bed Mobility: Sidelying to Sit   Sidelying to sit: Min assist       General bed mobility comments: Assist to elevate trunk and bring hips to EOB.  Transfers Overall transfer level: Needs assistance Equipment used: Ambulation equipment used [name]) Transfers: Sit to/from [employer] to Stand: +2 physical assistance;Mod assist;Total assist Stand pivot transfers: +2 physical assistance;Mod assist;Total assist       General transfer comment: Standing with Stedy from bed pt able to come up with 2 person mod A to bring hips up. Pivoted with Stedy to [facility]. When attempting to stand from [facility] required +3 total assist to stand.  Ambulation/Gait                 Stairs            Wheelchair Mobility    Modified Rankin (Stroke Patients Only)       Balance   Sitting-balance support: No upper extremity supported;Feet supported Sitting balance-Leahy Scale: Good       Standing balance-Leahy Scale: Poor Standing balance comment: Stedy and 2 person assist.                    Cognition Arousal/Alertness: Awake/alert Behavior During Therapy: WFL for tasks assessed/performed Overall Cognitive Status: Within Functional Limits for tasks assessed                      Exercises       General Comments        Pertinent Vitals/Pain Pain Assessment: No/denies pain    Home Living                      Prior Function            PT Goals (current goals can now be found in the care plan section) Progress towards PT goals: Progressing toward goals    Frequency  Min 2X/week    PT Plan Current plan remains appropriate    Co-evaluation             End of Session Equipment Utilized During Treatment: Gait belt;Other (comment) [name]) Activity Tolerance: Patient limited by fatigue Patient left: in chair;with call bell/phone within reach     Time: 4403-4742 PT Time Calculation (min) (ACUTE ONLY): 28 min  Charges:  $Therapeutic Activity: 23-37 mins                    G Codes:      Mettie Roylance 09/19/14, 11:07 AM  [name] PT [phone]

## 2014-08-21 NOTE — Progress Notes (Signed)
Patient ID: [name], male   DOB: 02/08/1956, 59 y.o.   MRN: [id number]  [facility] Progress Note   Assessment/ Plan:   1. 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 (All efforts geared towards salvage of current access as this appears to be his last option). He will continue with intravenous Fortaz upon discharge at hemodialysis for 4-6 weeks and then chronic suppressive therapy with Augmentin for 3-6 months per ID recommendations. 2.ESRD underwent hemodialysis without problems yesterday and will order for hemodialysis again today is still here. 3. 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 Nutrition: Continue to monitor on renal diet/nutritional supplement status post infection/surgery 6. Hypertension: Blood pressures appear to be well controlled, continue to monitor  Subjective:   Reports that he is fearful of being discharged home today because he still feels deconditioned and has been having some constipation/fecal incontinence    Objective:   BP 123/61 mmHg  Pulse 72  Temp(Src) 98 F (36.7 C) (Oral)  Resp 16  Ht 5\' 11"  (1.803 m)  Wt 117.255 kg (258 lb 8 oz)  BMI 36.07 kg/m2  SpO2 98%  Physical Exam: Gen: Appears to be uncomfortable resting in bed CVS: Pulse regular in rate and rhythm, S1 and S2 normal Resp: Clear to auscultation, no rales Abd: Soft, obese, mildly tender left lower quadrant Ext: Left thigh AVG with small punctate draining wound  Labs: BMET  Recent Labs Lab 08/14/14 1220 08/16/14 0545 08/17/14 0615 08/18/14 0400 08/19/14 0400  NA 137 135 136 135 135  K 4.4 4.6 4.0 4.2 4.2  CL 99 97 98 97 98  CO2 25 22 26 23 25   GLUCOSE 82 70 85  88 95  BUN 31* 49* 31* 44* 32*  CREATININE 6.00* 9.10* 7.12* 9.00* 6.98*  CALCIUM 9.2 7.6* 7.6* 7.4* 7.5*  PHOS 4.5  --   --   --   --    CBC  Recent Labs Lab 08/16/14 0545 08/17/14 0615 08/18/14 0900 08/19/14 0400  WBC 18.6* 14.0* 16.2* 13.7*  HGB 10.5* 9.5* 9.7* 9.7*  HCT 32.8* 30.2* 30.6* 31.1*  MCV 96.8 98.1 98.7 99.4  PLT 282 259 272 196   Medications:    . antiseptic oral rinse  7 mL Mouth Rinse BID  . aspirin EC  325 mg Oral Daily  . cefTAZidime (FORTAZ)  IV  2 g Intravenous Q M,W,F-HD  . feeding supplement (NEPRO CARB STEADY)  237 mL Oral BID BM  . heparin  5,000 Units Subcutaneous 3 times per day  . insulin aspart  0-9 Units Subcutaneous TID WC  . latanoprost  1 drop Both Eyes QHS  . morphine  30 mg Oral Q12H  . multivitamin  1 tablet Oral QHS  . vancomycin  1,000 mg Intravenous Q M,W,F-HD   Zetta Bills, MD 08/21/2014, 9:52 AM

## 2014-08-21 NOTE — Progress Notes (Signed)
Nursing staff unable to assess bruit or thrill at graft site. Vascular surgery made aware. Geronimo Boot, RN

## 2014-08-22 ENCOUNTER — Encounter (HOSPITAL_COMMUNITY): Payer: Self-pay | Admitting: Anesthesiology

## 2014-08-22 ENCOUNTER — Inpatient Hospital Stay (HOSPITAL_COMMUNITY): Payer: Medicare Other | Admitting: Certified Registered"

## 2014-08-22 ENCOUNTER — Encounter (HOSPITAL_COMMUNITY)
Admission: EM | Disposition: A | Discharge status: Skilled Nursing Facility | Payer: Self-pay | Source: Home / Self Care | Attending: Internal Medicine

## 2014-08-22 DIAGNOSIS — T82868A Thrombosis of vascular prosthetic devices, implants and grafts, initial encounter: Secondary | ICD-10-CM

## 2014-08-22 HISTORY — PX: THROMBECTOMY W/ EMBOLECTOMY: SHX2507

## 2014-08-22 LAB — GLUCOSE, CAPILLARY
GLUCOSE-CAPILLARY: 73 mg/dL (ref 70–99)
Glucose-Capillary: 82 mg/dL (ref 70–99)
Glucose-Capillary: 79 mg/dL (ref 70–99)
Glucose-Capillary: 78 mg/dL (ref 70–99)
Glucose-Capillary: 98 mg/dL (ref 70–99)

## 2014-08-22 LAB — CBC
HCT: 27.7 % — ABNORMAL LOW (ref 39.0–52.0)
HEMOGLOBIN: 9 g/dL — AB (ref 13.0–17.0)
MCH: 31 pg (ref 26.0–34.0)
MCHC: 32.5 g/dL (ref 30.0–36.0)
MCV: 95.5 fL (ref 78.0–100.0)
PLATELETS: 213 10*3/uL (ref 150–400)
RBC: 2.9 MIL/uL — ABNORMAL LOW (ref 4.22–5.81)
RDW: 17.4 % — ABNORMAL HIGH (ref 11.5–15.5)
WBC: 15 10*3/uL — ABNORMAL HIGH (ref 4.0–10.5)

## 2014-08-22 LAB — BASIC METABOLIC PANEL
ANION GAP: 15 (ref 5–15)
Anion gap: 17 — ABNORMAL HIGH (ref 5–15)
BUN: 54 mg/dL — ABNORMAL HIGH (ref 6–23)
BUN: 54 mg/dL — ABNORMAL HIGH (ref 6–23)
CALCIUM: 6.8 mg/dL — AB (ref 8.4–10.5)
CO2: 21 mmol/L (ref 19–32)
CO2: 24 mmol/L (ref 19–32)
CREATININE: 10.78 mg/dL — AB (ref 0.50–1.35)
CREATININE: 11.26 mg/dL — AB (ref 0.50–1.35)
Calcium: 7.2 mg/dL — ABNORMAL LOW (ref 8.4–10.5)
Chloride: 94 mmol/L — ABNORMAL LOW (ref 96–112)
Chloride: 94 mmol/L — ABNORMAL LOW (ref 96–112)
GFR calc Af Amer: 5 mL/min — ABNORMAL LOW (ref >90)
GFR calc Af Amer: 5 mL/min — ABNORMAL LOW (ref >90)
GFR calc non Af Amer: 5 mL/min — ABNORMAL LOW (ref >90)
GFR calc non Af Amer: 4 mL/min — ABNORMAL LOW (ref >90)
Glucose, Bld: 74 mg/dL (ref 70–99)
Glucose, Bld: 110 mg/dL — ABNORMAL HIGH (ref 70–99)
Potassium: 5.5 mmol/L — ABNORMAL HIGH (ref 3.5–5.1)
Potassium: 4.9 mmol/L (ref 3.5–5.1)
SODIUM: 132 mmol/L — AB (ref 135–145)
Sodium: 133 mmol/L — ABNORMAL LOW (ref 135–145)

## 2014-08-22 LAB — WOUND CULTURE
Culture: NO GROWTH
Special Requests: NORMAL

## 2014-08-22 LAB — PROTIME-INR
INR: 1.06 (ref 0.00–1.49)
Prothrombin Time: 13.9 seconds (ref 11.6–15.2)

## 2014-08-22 SURGERY — THROMBECTOMY ARTERIOVENOUS GORE-TEX GRAFT
Anesthesia: General | Site: Leg Upper | Laterality: Left

## 2014-08-22 MED ORDER — FENTANYL CITRATE (PF) 100 MCG/2ML IJ SOLN
INTRAMUSCULAR | Status: AC
  Filled 2014-08-22: qty 2

## 2014-08-22 MED ORDER — SODIUM CHLORIDE 0.9 % IR SOLN
Status: DC | PRN
  Administered 2014-08-22: 500 mL

## 2014-08-22 MED ORDER — HEPARIN SODIUM (PORCINE) 1000 UNIT/ML IJ SOLN
INTRAMUSCULAR | Status: DC | PRN
  Administered 2014-08-22: 5000 [IU] via INTRAVENOUS

## 2014-08-22 MED ORDER — MIDAZOLAM HCL 2 MG/2ML IJ SOLN
INTRAMUSCULAR | Status: AC
  Filled 2014-08-22: qty 2

## 2014-08-22 MED ORDER — SODIUM CHLORIDE 0.9 % IR SOLN
Status: DC | PRN
  Administered 2014-08-22: 1000 mL

## 2014-08-22 MED ORDER — LORAZEPAM 0.5 MG PO TABS
0.5000 mg | ORAL_TABLET | Freq: Three times a day (TID) | ORAL | Status: DC | PRN
Start: 2014-08-22 — End: 2014-08-23

## 2014-08-22 MED ORDER — VANCOMYCIN HCL IN DEXTROSE 1-5 GM/200ML-% IV SOLN
1000.0000 mg | INTRAVENOUS | Status: AC
  Administered 2014-08-22: 1000 mg via INTRAVENOUS
  Filled 2014-08-22: qty 200

## 2014-08-22 MED ORDER — ESCITALOPRAM OXALATE 10 MG PO TABS
10.0000 mg | ORAL_TABLET | Freq: Every day | ORAL | Status: DC
  Administered 2014-08-22 – 2014-08-23 (×2): 10 mg via ORAL
  Filled 2014-08-22 (×2): qty 1

## 2014-08-22 MED ORDER — FENTANYL CITRATE (PF) 100 MCG/2ML IJ SOLN
INTRAMUSCULAR | Status: DC | PRN
  Administered 2014-08-22 (×2): 25 ug via INTRAVENOUS

## 2014-08-22 MED ORDER — MEPERIDINE HCL 25 MG/ML IJ SOLN: 6.2500 mg | INTRAMUSCULAR | Status: DC | PRN

## 2014-08-22 MED ORDER — LIDOCAINE HCL (CARDIAC) 20 MG/ML IV SOLN
INTRAVENOUS | Status: DC | PRN
  Administered 2014-08-22: 100 mg via INTRAVENOUS

## 2014-08-22 MED ORDER — PROMETHAZINE HCL 25 MG/ML IJ SOLN
6.2500 mg | INTRAMUSCULAR | Status: DC | PRN
Start: 2014-08-22 — End: 2014-08-22

## 2014-08-22 MED ORDER — FENTANYL CITRATE (PF) 250 MCG/5ML IJ SOLN
INTRAMUSCULAR | Status: AC
  Filled 2014-08-22: qty 5

## 2014-08-22 MED ORDER — LIDOCAINE HCL (CARDIAC) 20 MG/ML IV SOLN
INTRAVENOUS | Status: AC
  Filled 2014-08-22: qty 5

## 2014-08-22 MED ORDER — PHENYLEPHRINE HCL 10 MG/ML IJ SOLN
INTRAMUSCULAR | Status: DC | PRN
  Administered 2014-08-22: 80 ug via INTRAVENOUS
  Administered 2014-08-22 (×3): 40 ug via INTRAVENOUS

## 2014-08-22 MED ORDER — DEXTROSE 5 % IV SOLN
2.0000 g | INTRAVENOUS | Status: AC
  Administered 2014-08-22: 2 g via INTRAVENOUS
  Filled 2014-08-22: qty 2

## 2014-08-22 MED ORDER — FENTANYL CITRATE (PF) 100 MCG/2ML IJ SOLN
25.0000 ug | INTRAMUSCULAR | Status: DC | PRN
  Administered 2014-08-22: 25 ug via INTRAVENOUS

## 2014-08-22 MED ORDER — PROPOFOL 10 MG/ML IV BOLUS
INTRAVENOUS | Status: AC
  Filled 2014-08-22: qty 20

## 2014-08-22 MED ORDER — LIDOCAINE-EPINEPHRINE 0.5 %-1:200000 IJ SOLN
INTRAMUSCULAR | Status: AC
  Filled 2014-08-22: qty 1

## 2014-08-22 MED ORDER — PROPOFOL 10 MG/ML IV BOLUS
INTRAVENOUS | Status: DC | PRN
  Administered 2014-08-22: 150 mg via INTRAVENOUS

## 2014-08-22 SURGICAL SUPPLY — 38 items
ARMBAND PINK RESTRICT EXTREMIT (MISCELLANEOUS) ×3 IMPLANT
BENZOIN TINCTURE PRP APPL 2/3 (GAUZE/BANDAGES/DRESSINGS) ×3 IMPLANT
CANISTER SUCTION 2500CC (MISCELLANEOUS) ×3 IMPLANT
CANNULA VESSEL 3MM 2 BLNT TIP (CANNULA) IMPLANT
CATH EMB 4FR 80CM (CATHETERS) ×9 IMPLANT
CLIP LIGATING EXTRA MED SLVR (CLIP) ×3 IMPLANT
CLIP LIGATING EXTRA SM BLUE (MISCELLANEOUS) ×3 IMPLANT
CLOSURE WOUND 1/2 X4 (GAUZE/BANDAGES/DRESSINGS) ×1
DECANTER SPIKE VIAL GLASS SM (MISCELLANEOUS) ×3 IMPLANT
DRAPE ORTHO SPLIT 77X108 STRL (DRAPES) ×2
DRAPE SURG ORHT 6 SPLT 77X108 (DRAPES) ×1 IMPLANT
ELECT REM PT RETURN 9FT ADLT (ELECTROSURGICAL) ×3
ELECTRODE REM PT RTRN 9FT ADLT (ELECTROSURGICAL) ×1 IMPLANT
GAUZE SPONGE 4X4 12PLY STRL (GAUZE/BANDAGES/DRESSINGS) ×3 IMPLANT
GEL ULTRASOUND 20GR AQUASONIC (MISCELLANEOUS) IMPLANT
GLOVE BIO SURGEON STRL SZ 6.5 (GLOVE) ×2 IMPLANT
GLOVE BIO SURGEONS STRL SZ 6.5 (GLOVE) ×1
GLOVE BIOGEL PI IND STRL 6.5 (GLOVE) ×1 IMPLANT
GLOVE BIOGEL PI INDICATOR 6.5 (GLOVE) ×2
GLOVE SKINSENSE NS SZ7.0 (GLOVE) ×6
GLOVE SKINSENSE STRL SZ7.0 (GLOVE) ×3 IMPLANT
GLOVE SS BIOGEL STRL SZ 7.5 (GLOVE) ×1 IMPLANT
GLOVE SUPERSENSE BIOGEL SZ 7.5 (GLOVE) ×2
GOWN STRL REUS W/ TWL LRG LVL3 (GOWN DISPOSABLE) ×5 IMPLANT
GOWN STRL REUS W/TWL LRG LVL3 (GOWN DISPOSABLE) ×10
KIT BASIN OR (CUSTOM PROCEDURE TRAY) ×3 IMPLANT
KIT ROOM TURNOVER OR (KITS) ×3 IMPLANT
NS IRRIG 1000ML POUR BTL (IV SOLUTION) ×3 IMPLANT
PACK CV ACCESS (CUSTOM PROCEDURE TRAY) ×3 IMPLANT
PAD ARMBOARD 7.5X6 YLW CONV (MISCELLANEOUS) ×6 IMPLANT
SPONGE GAUZE 4X4 12PLY STER LF (GAUZE/BANDAGES/DRESSINGS) ×3 IMPLANT
STRIP CLOSURE SKIN 1/2X4 (GAUZE/BANDAGES/DRESSINGS) ×2 IMPLANT
SUT PROLENE 6 0 CC (SUTURE) ×6 IMPLANT
SUT VIC AB 3-0 SH 27 (SUTURE) ×2
SUT VIC AB 3-0 SH 27X BRD (SUTURE) ×1 IMPLANT
TAPE CLOTH SURG 4X10 WHT LF (GAUZE/BANDAGES/DRESSINGS) ×3 IMPLANT
UNDERPAD 30X30 INCONTINENT (UNDERPADS AND DIAPERS) ×3 IMPLANT
WATER STERILE IRR 1000ML POUR (IV SOLUTION) ×3 IMPLANT

## 2014-08-22 NOTE — Transfer of Care (Signed)
Immediate Anesthesia Transfer of Care Note  Patient: [name]  Procedure(s) Performed: Procedure(s): THROMBECTOMY ARTERIOVENOUS GORE-TEX GRAFT Of Left Thigh Graft (Left)  Patient Location: PACU  Anesthesia Type:General  Level of Consciousness: awake, alert  and oriented  Airway & Oxygen Therapy: Patient Spontanous Breathing and Patient connected to nasal cannula oxygen  Post-op Assessment: Report given to RN and Post -op Vital signs reviewed and stable  Post vital signs: Reviewed and stable  Last Vitals:  Filed Vitals:   08/22/14 0420  BP: 100/49  Pulse:   Temp: 36.8 C  Resp:     Complications: No apparent anesthesia complications

## 2014-08-22 NOTE — H&P (View-Only) (Signed)
Pt had dialysis yesterday Noted to have no bruit in graft around 3pm yesterday Pt has been off his effient for about 10days will continue to hold this. Will try to get to OR for thrombectomy revision of left thigh graft later today if OR time available If no slot today will do thrombectomy tomorrow  Keep NPO for now Plan discussed with patient  Artesia Berkey, MD [facility] Office: [phone] Pager: [phone]  

## 2014-08-22 NOTE — Anesthesia Postprocedure Evaluation (Signed)
Anesthesia Post Note  Patient: [name]  Procedure(s) Performed: Procedure(s) (LRB): THROMBECTOMY ARTERIOVENOUS GORE-TEX GRAFT Of Left Thigh Graft (Left)  Anesthesia type: General  Patient location: PACU  Post pain: Pain level controlled  Post assessment: Post-op Vital signs reviewed  Last Vitals: BP 109/81 mmHg  Pulse 71  Temp(Src) 36.6 C (Oral)  Resp 9  Ht 5\' 11"  (1.803 m)  Wt 253 lb 1.6 oz (114.805 kg)  BMI 35.32 kg/m2  SpO2 100%  Post vital signs: Reviewed  Level of consciousness: sedated  Complications: No apparent anesthesia complications

## 2014-08-22 NOTE — Anesthesia Procedure Notes (Signed)
Procedure Name: LMA Insertion Date/Time: 08/22/2014 1:38 PM Performed by: Gwenyth Allegra Pre-anesthesia Checklist: Patient identified, Timeout performed, Emergency Drugs available, Suction available and Patient being monitored Patient Re-evaluated:Patient Re-evaluated prior to inductionOxygen Delivery Method: Circle system utilized Preoxygenation: Pre-oxygenation with 100% oxygen Intubation Type: IV induction Ventilation: Mask ventilation without difficulty LMA: LMA with gastric port inserted LMA Size: 5.0 Number of attempts: 1 Tube secured with: Tape Dental Injury: Teeth and Oropharynx as per pre-operative assessment

## 2014-08-22 NOTE — Anesthesia Preprocedure Evaluation (Addendum)
Anesthesia Evaluation  Patient identified by MRN, date of birth, ID band Patient awake    Reviewed: Allergy & Precautions, NPO status , reviewed documented beta blocker date and time   History of Anesthesia Complications (+) PONV  Airway Mallampati: II  TM Distance: >3 FB Neck ROM: Full    Dental  (+) Edentulous Lower, Edentulous Upper   Pulmonary Current Smoker,  breath sounds clear to auscultation        Cardiovascular hypertension, + CAD, + Past MI, + Cardiac Stents, + Peripheral Vascular Disease and +CHF Rhythm:Regular Rate:Normal  Echo 08/17/2014 - Left ventricle: The cavity size was normal. Wall thickness was increased in a pattern of mild LVH. Systolic function was normal. The estimated ejection fraction was in the range of 55% to 60%. Wall motion was normal; there were no regional wall motion abnormalities. Left ventricular diastolic function parameters were normal. - Mitral valve: Calcified annulus. - Right atrium: The atrium was mildly dilated.   '15 EF 55% 4/16 stress myoview: No reversible ischemia or infarction, Hypokinesia of the inferior septal wall, Left ventricular ejection fraction 52%    Neuro/Psych  Headaches, Anxiety Chronic pain: narcotics Bell's palsy    GI/Hepatic GERD-  Medicated and Controlled,(+) Hepatitis -, BMarkedly elevated LFTs   Endo/Other  diabetes, Type 2  Renal/GU ESRF and DialysisRenal disease (K+ 4.9)     Musculoskeletal  (+) Arthritis -,   Abdominal (+) + obese,   Peds  Hematology  (+) Blood dyscrasia (effient, Hb 10.5), ,   Anesthesia Other Findings   Reproductive/Obstetrics                            Anesthesia Physical  Anesthesia Plan  ASA: III  Anesthesia Plan: General   Post-op Pain Management:    Induction: Intravenous  Airway Management Planned: LMA  Additional Equipment:   Intra-op Plan:   Post-operative Plan: Extubation in  OR  Informed Consent: I have reviewed the patients History and Physical, chart, labs and discussed the procedure including the risks, benefits and alternatives for the proposed anesthesia with the patient or authorized representative who has indicated his/her understanding and acceptance.   Dental advisory given  Plan Discussed with: CRNA  Anesthesia Plan Comments:         Anesthesia Quick Evaluation

## 2014-08-22 NOTE — Interval H&P Note (Signed)
History and Physical Interval Note:  08/22/2014 12:22 PM  [name]  has presented today for surgery, with the diagnosis of clotted graft  The various methods of treatment have been discussed with the patient and family. After consideration of risks, benefits and other options for treatment, the patient has consented to  Procedure(s): THROMBECTOMY ARTERIOVENOUS GORE-TEX GRAFT (Left) as a surgical intervention .  The patient's history has been reviewed, patient examined, no change in status, stable for surgery.  I have reviewed the patient's chart and labs.  Questions were answered to the patient's satisfaction.     Brentley Landfair

## 2014-08-22 NOTE — Op Note (Signed)
    OPERATIVE REPORT  DATE OF SURGERY: 08/22/2014  PATIENT: [name], 59 y.o. male MRN: [id number]  DOB: 1955-10-01  PRE-OPERATIVE DIAGNOSIS: End-stage renal disease with occlusion of left femoral loop AV Gore-Tex graft  POST-OPERATIVE DIAGNOSIS:  Same  PROCEDURE: Thrombectomy of left femoral loop AV Gore-Tex graft  SURGEON:  Gretta Began, M.D.  PHYSICIAN ASSISTANT: Trinh  ANESTHESIA:  Gen.  EBL: 100 ml     BLOOD ADMINISTERED: None  DRAINS: None  SPECIMEN: None  COUNTS CORRECT:  YES  PLAN OF CARE: PACU stable   PATIENT DISPOSITION:  PACU - hemodynamically stable  PROCEDURE DETAILS: The patient was taken to the operating placed supine position where the area of the left groin and thigh were prepped and draped in usual sterile fashion. Incision was made near the prior groin incision ^ M isolate the venous limb of the graft near the venous anastomosis. The graft was opened to a prior revision site transversely. The venous anastomosis was thrombectomized with excellent venous backbleeding. A 5 dilator passed without resistance through this. This was flushed with heparinized saline and reoccluded. The graft and was thrombectomized through the loop segment. The patient had a recent revision of the venous limb proximally 10 days ago. This was a widely patent with the the 4 Fogarty catheter passing with ease through this. I thrombus was removed. The catheter would not go pass an area in the midportion of the arterial limb of the graft. For this reason a separate incision was made in the Gore-Tex graft through the same skin incision. The graft was exposed near the arterial anastomosis and the graft was opened transversely to a prior revision site. The Fogarty catheter passed through the area of moderate degeneration and I thrombus was removed. This was flushed with heparinized saline through the graft. 5 dilator passed without resistance through this area. Finally the arterial  anastomosis was thrombectomized. The arterialized plug was removed and excellent flow was encountered. This was flushed heparinized saline as well. She remission the patient was given 5000 units intravenous heparin. The incision and the graft was closed with a running 6-0 Prolene suture at the graftotomy near the arterial and venous limb of the anastomoses. Clamps removed and good thrill was noted. Wounds irrigated with saline. Hemostasis tablet cautery. Wounds were closed with 3-0 Vicryl in the subcutaneous and subcuticular tissue.   Gretta Began, M.D. 08/22/2014 3:23 PM

## 2014-08-22 NOTE — Progress Notes (Signed)
Pt had dialysis yesterday Noted to have no bruit in graft around 3pm yesterday Pt has been off his effient for about 10days will continue to hold this. Will try to get to OR for thrombectomy revision of left thigh graft later today if OR time available If no slot today will do thrombectomy tomorrow  Keep NPO for now Plan discussed with patient  Fabienne Bruns, MD [facility] Office: [phone] Pager: [phone]

## 2014-08-22 NOTE — Progress Notes (Signed)
[facility] PROGRESS NOTE  [name] [name] DOB: [birth year] DOA: 08/10/2014 PCP: Dyke Maes, MD    Interim summary:  24- year ol  Male with multiple medical  Problems including chronic diastolic heart failure, ESRD on HD 4 times a week, diet controlled DM, h/o hepatitis C, hypertension,  CAD s/p PCI, admitted for chest pain, back pain.  On admission he was found to be hypotensive and underwent HD on Friday 4 /14.     HPI/Subjective: Patient in bed, denies any headache, no chest pain or abdominal pain. No shortness of breath. No bleeding from the left thigh , some chronic left sided low back pain and left thigh pain overall feels better.      Assessment/Plan:  Sepsis and bleeding from L thigh HD graft (abscess) with Leukocytosis with hypotension:  Had revision of the left thigh AV graft on 4/19. MRI of the left thigh on 08/16/2014 noted with small Abscess, VVS and ID saw the patient.   Currently consensus to salvage the graft site as this appears to be that this is the last graft site for this patient, he has been treated with IV vancomycin and his leukocytosis is improving and overall abscess site seems to be gradually improving as well. Per ID he will be switched to IV Fortaz on 08/20/2014 for 4-6 weeks with close outpatient ID follow-up and then possibly placed on oral Augmentin chronically for suppressive treatment.  He was stable for discharge and was actually discharged morning of 08/21/2014, however late in the evening it seemed like his left thigh HD graft stopped working, vascular surgery was reconsulted, he is nothing by mouth and due to go to OR for possible surgical correction. We will hold his discharge for today.  No other obvious source of infection Chest x-ray unremarkable, right upper quadrant ultrasound nonacute, stable TTE. .    Transaminitis and rhabdomyolysis probably from myositis: Elevated liver enzymes likely due to combination of  hypotension with shock liver, rhabdomyolysis being on high-dose statin. Seen by GI. Does have history of hep C and remote history of hep B. No further GI workup per gastroenterology besides outpatient follow-up for HCV. Liver enzymes trending down serially.   Rhabdomyolysis. Shin due to the infection above VS high-dose statin. Monitor trend of CK which continues to improve daily. We'll resume statin post discharge with close CK monitoring. If CK rises again discontinue present statin.   Hx of hepatitis C infection. Elevated viral load, will follow with GI outpatient along with ID.   Chest pain / abnormal EKG/ ELEVATED troponin's CAD s/p PCI: chest pain resolved. Underwent stress myoview. Results are unremarkable. Cardiology signed off. Off Effient for the vascular procedure on 4/18.    ESRD ON  HD: Further management as per renal. HD 4 times a week. Renal on board.     Anemia:AOCD Mild and normocytic.    Diabetes Mellitus: CBG (last 3)   Recent Labs  08/21/14 1122 08/21/14 1625 08/22/14 0749  GLUCAP 87 77 82   A1c 5.4 and  Diet controlled DM.     Hypertension: controlled.    Chronic diastolic heart failure EF 55% : He appears to be compensated.    Chronic low back pain. MRI stable. Supportive care.      DVT prophylaxis  Heparin   Code Status: full code Family Communication: NONE at bedside Disposition Plan: SNF 08/21/2014    Consultants:  Renal  Vascular  Cardiology   ID  GI  Procedures:  Stress myoview  HD  Revision of the left thigh medial graft  TTE  - Left ventricle: The cavity size was normal. Wall thickness wasincreased in a pattern of mild LVH. Systolic function was normal.The estimated ejection fraction was in the range of 55% to 60%.Wall motion was normal; there were no regional wall motionabnormalities. Left ventricular diastolic function parameterswere normal. - Mitral valve: Calcified annulus. - Right atrium: The atrium was  mildly dilated.    Anti-infectives    Start     Dose/Rate Route Frequency Ordered Stop   08/21/14 0000  cefTAZidime in dextrose 5 % 50 mL     100 mL/hr over 30 Minutes   08/21/14 1029     08/20/14 1200  vancomycin (VANCOCIN) IVPB 1000 mg/200 mL premix     1,000 mg 200 mL/hr over 60 Minutes Intravenous Every M-W-F (Hemodialysis) 08/19/14 1217     08/20/14 1200  cefTAZidime (FORTAZ) 2 g in dextrose 5 % 50 mL IVPB     2 g 100 mL/hr over 30 Minutes Intravenous Every M-W-F (Hemodialysis) 08/19/14 1217     08/19/14 1230  cefTAZidime (FORTAZ) 2 g in dextrose 5 % 50 mL IVPB     2 g 100 mL/hr over 30 Minutes Intravenous  Once 08/19/14 1217 08/19/14 1420   08/18/14 1200  vancomycin (VANCOCIN) IVPB 1000 mg/200 mL premix  Status:  Discontinued     1,000 mg 200 mL/hr over 60 Minutes Intravenous Every T-Th-Sa (Hemodialysis) 08/17/14 0836 08/19/14 1217   08/16/14 1200  vancomycin (VANCOCIN) IVPB 1000 mg/200 mL premix     1,000 mg 200 mL/hr over 60 Minutes Intravenous  Once 08/16/14 1145 08/16/14 1330   08/14/14 0945  vancomycin (VANCOCIN) IVPB 1000 mg/200 mL premix     1,000 mg 200 mL/hr over 60 Minutes Intravenous  Once 08/14/14 0933 08/14/14 1306   08/11/14 2000  vancomycin (VANCOCIN) 2,000 mg in sodium chloride 0.9 % 500 mL IVPB     2,000 mg 250 mL/hr over 120 Minutes Intravenous  Once 08/11/14 1803 08/11/14 2343   08/10/14 1700  piperacillin-tazobactam (ZOSYN) IVPB 2.25 g  Status:  Discontinued     2.25 g 100 mL/hr over 30 Minutes Intravenous 3 times per day 08/10/14 1646 08/12/14 1054      Objective:  Filed Vitals:   08/21/14 0500 08/21/14 2013 08/21/14 2044 08/22/14 0420  BP: 123/61 141/74 124/81 100/49  Pulse: 72 76    Temp: 98 F (36.7 C) 97.9 F (36.6 C)  98.3 F (36.8 C)  TempSrc:    Oral  Resp: 16 20    Height:      Weight: 117.255 kg (258 lb 8 oz)   114.805 kg (253 lb 1.6 oz)  SpO2: 98% 98%  100%    Intake/Output Summary (Last 24 hours) at 08/22/14 1041 Last data  filed at 08/21/14 2325  Gross per 24 hour  Intake    240 ml  Output      0 ml  Net    240 ml   Filed Weights   08/20/14 1750 08/21/14 0500 08/22/14 0420  Weight: 116.2 kg (256 lb 2.8 oz) 117.255 kg (258 lb 8 oz) 114.805 kg (253 lb 1.6 oz)    Exam:   General:  Alert not indistress  Cardiovascular: s1s2  Respiratory: diminished air entry at bases  Abdomen: soft non tender non distended bowel sounds heard  Musculoskeletal: pedal edema present  L thigh - incision site and a bandage, mild surrounding erythema and induration, no physical signs of compartment  syndrome in the left thigh  Data Reviewed: Basic Metabolic Panel:  Recent Labs Lab 08/16/14 0545 08/17/14 0615 08/18/14 0400 08/19/14 0400 08/22/14 0600  NA 135 136 135 135 132*  K 4.6 4.0 4.2 4.2 5.5*  CL 97 98 97 98 94*  CO2 22 26 23 25 21   GLUCOSE 70 85 88 95 74  BUN 49* 31* 44* 32* 54*  CREATININE 9.10* 7.12* 9.00* 6.98* 10.78*  CALCIUM 7.6* 7.6* 7.4* 7.5* 7.2*   Liver Function Tests:  Recent Labs Lab 08/16/14 0545 08/17/14 0615 08/18/14 0400 08/19/14 0400  AST 1498* 1128* 869* 712*  ALT 150* 92* 64* 53  ALKPHOS 150* 134* 116 114  BILITOT 0.7 0.7 0.9 0.7  PROT 6.6 6.8 6.5 7.1  ALBUMIN 2.1* 2.1* 2.1* 2.2*   No results for input(s): LIPASE, AMYLASE in the last 168 hours. No results for input(s): AMMONIA in the last 168 hours. CBC:  Recent Labs Lab 08/16/14 0545 08/17/14 0615 08/18/14 0900 08/19/14 0400 08/22/14 0600  WBC 18.6* 14.0* 16.2* 13.7* 15.0*  HGB 10.5* 9.5* 9.7* 9.7* 9.0*  HCT 32.8* 30.2* 30.6* 31.1* 27.7*  MCV 96.8 98.1 98.7 99.4 95.5  PLT 282 259 272 196 213   Cardiac Enzymes:  Recent Labs Lab 08/16/14 0545 08/17/14 0615 08/18/14 0400 08/19/14 0400  CKTOTAL 9482* 6751* 5047* 3442*   BNP (last 3 results) No results for input(s): BNP in the last 8760 hours.  ProBNP (last 3 results) No results for input(s): PROBNP in the last 8760 hours.  CBG:  Recent Labs Lab  08/20/14 2105 08/21/14 0732 08/21/14 1122 08/21/14 1625 08/22/14 0749  GLUCAP 108* 77 87 77 82    Recent Results (from the past 240 hour(s))  Clostridium Difficile by PCR     Status: None   Collection Time: 08/13/14  1:35 AM  Result Value Ref Range Status   C difficile by pcr NEGATIVE NEGATIVE Final  Anaerobic culture     Status: None   Collection Time: 08/13/14 12:46 PM  Result Value Ref Range Status   Specimen Description WOUND LEFT GRAFT  Final   Special Requests POF ANCEF  Final   Gram Stain   Final    NO WBC SEEN NO SQUAMOUS EPITHELIAL CELLS SEEN NO ORGANISMS SEEN Performed at [facility]    Culture   Final    NO ANAEROBES ISOLATED Performed at [facility]    Report Status 08/18/2014 FINAL  Final  Wound culture     Status: None   Collection Time: 08/13/14 12:46 PM  Result Value Ref Range Status   Specimen Description WOUND LEFT GRAFT  Final   Special Requests POF ANCEF  Final   Gram Stain   Final    NO WBC SEEN NO SQUAMOUS EPITHELIAL CELLS SEEN NO ORGANISMS SEEN Performed at [facility]    Culture   Final    NO GROWTH 2 DAYS Performed at [facility]    Report Status 08/15/2014 FINAL  Final  Wound culture     Status: None   Collection Time: 08/19/14 10:20 AM  Result Value Ref Range Status   Specimen Description HEMODIALYSIS GRAFT  Final   Special Requests Normal  Final   Gram Stain   Final    RARE WBC PRESENT, PREDOMINANTLY PMN NO SQUAMOUS EPITHELIAL CELLS SEEN NO ORGANISMS SEEN Performed at [facility]    Culture   Final    NO GROWTH 2 DAYS Performed at [facility]  Report Status 08/22/2014 FINAL  Final     Studies: No results found.  Scheduled Meds: . antiseptic oral rinse  7 mL Mouth Rinse BID  . aspirin EC  325 mg Oral Daily  . cefTAZidime (FORTAZ)  IV  2 g Intravenous Q M,W,F-HD  . feeding supplement (NEPRO CARB STEADY)  237 mL Oral BID BM  . heparin  5,000 Units Subcutaneous 3  times per day  . insulin aspart  0-9 Units Subcutaneous TID WC  . latanoprost  1 drop Both Eyes QHS  . morphine  30 mg Oral Q12H  . multivitamin  1 tablet Oral QHS  . vancomycin  1,000 mg Intravenous Q M,W,F-HD   Continuous Infusions: . sodium chloride 10 mL/hr at 08/13/14 1033  . sodium chloride 10 mL/hr at 08/16/14 1610    Principal Problem:   Sepsis associated hypotension Active Problems:   End-stage renal disease on hemodialysis   History of placement of stent in LAD coronary artery   HTN (hypertension)   Radicular leg pain   Right-sided low back pain with right-sided sciatica   Elevated troponin   Abnormal transaminases   Metabolic encephalopathy   Chest pain   Shock circulatory   Pain in the chest   Weakness   Abnormal EKG   Abnormal liver function test   Lumbago   Diabetic foot ulcer   Myositis   NSTEMI (non-ST elevated myocardial infarction)   Thigh pain   Infection and inflammatory reaction due to cardiac device, implant, and graft   Back pain    Time spent: 25 minutes    SINGH,PRASHANT K  [facility] Pager [phone] If 7PM-7AM, please contact night-coverage at [email or web address], password [facility] 08/22/2014, 10:41 AM  LOS: 12 days

## 2014-08-22 NOTE — Progress Notes (Signed)
Patient ID: [name], male   DOB: May 11, 1955, 59 y.o.   MRN: [id number]  [facility] Progress Note   Assessment/ Plan:   1. 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 He will continue with intravenous Fortaz upon discharge at hemodialysis for 4-6 weeks and then chronic suppressive therapy with Augmentin for 3-6 months per ID recommendations. Unfortunately, his AVG thrombosed yesterday and vascular surgery have him on schedule today for open thrombectomy given recent surgery. 2.ESRD : Last hemodialysis done on Monday, currently without any acute needs and may be able to wait until tomorrow for hemodialysis after thrombectomy today. May need to give Kayexalate this evening based on potassium levels. We'll recheck labs again this afternoon. 3. 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 Nutrition: Continue to monitor on renal diet/nutritional supplement status post infection/surgery 6. Hypertension: Blood pressures appear to be well controlled, continue to monitor  Subjective:   Reports to be feeling fair-frustrated with his thrombosed AVG.    Objective:   BP 100/49 mmHg  Pulse 76  Temp(Src) 98.3 F (36.8 C) (Oral)  Resp 20  Ht 5\' 11"  (1.803 m)  Wt 114.805 kg (253 lb 1.6 oz)  BMI 35.32 kg/m2  SpO2 100%  Physical Exam: Gen: Comfortably resting in bed CVS: Pulse regular in rate and rhythm, S1 and S2 normal Resp: Coarse breath sounds bilaterally-no rales Abd: Soft, obese, nontender  Ext: No lower extremity edema  Labs: BMET  Recent Labs Lab 08/16/14 0545 08/17/14 0615 08/18/14 0400 08/19/14 0400 08/22/14 0600  NA 135 136 135 135 132*  K 4.6 4.0 4.2 4.2 5.5*  CL 97 98 97 98 94*  CO2 22 26 23 25 21    GLUCOSE 70 85 88 95 74  BUN 49* 31* 44* 32* 54*  CREATININE 9.10* 7.12* 9.00* 6.98* 10.78*  CALCIUM 7.6* 7.6* 7.4* 7.5* 7.2*   CBC  Recent Labs Lab 08/17/14 0615 08/18/14 0900 08/19/14 0400 08/22/14 0600  WBC 14.0* 16.2* 13.7* 15.0*  HGB 9.5* 9.7* 9.7* 9.0*  HCT 30.2* 30.6* 31.1* 27.7*  MCV 98.1 98.7 99.4 95.5  PLT 259 272 196 213   Medications:    . antiseptic oral rinse  7 mL Mouth Rinse BID  . aspirin EC  325 mg Oral Daily  . cefTAZidime (FORTAZ)  IV  2 g Intravenous Q M,W,F-HD  . feeding supplement (NEPRO CARB STEADY)  237 mL Oral BID BM  . heparin  5,000 Units Subcutaneous 3 times per day  . insulin aspart  0-9 Units Subcutaneous TID WC  . latanoprost  1 drop Both Eyes QHS  . morphine  30 mg Oral Q12H  . multivitamin  1 tablet Oral QHS  . vancomycin  1,000 mg Intravenous Q M,W,F-HD   Zetta Bills, MD 08/22/2014, 9:32 AM

## 2014-08-23 ENCOUNTER — Ambulatory Visit (HOSPITAL_COMMUNITY): Payer: Medicare Other

## 2014-08-23 ENCOUNTER — Ambulatory Visit: Admit: 2014-08-23 | Payer: Self-pay

## 2014-08-23 ENCOUNTER — Encounter (HOSPITAL_COMMUNITY): Payer: Self-pay | Admitting: Vascular Surgery

## 2014-08-23 LAB — GLUCOSE, CAPILLARY: Glucose-Capillary: 85 mg/dL (ref 70–99)

## 2014-08-23 SURGERY — RADIOLOGY WITH ANESTHESIA
Anesthesia: General

## 2014-08-23 MED ORDER — HYDROMORPHONE HCL 1 MG/ML IJ SOLN
INTRAMUSCULAR | Status: AC
  Filled 2014-08-23: qty 1

## 2014-08-23 NOTE — Progress Notes (Signed)
CSW (Clinical Child psychotherapist) prepared pt dc packet and placed with shadow chart. CSW arranged non-emergent ambulance transport. Pt,pt nurse, and facility informed. Pt confirmed he would like to notify family. CSW signing off.  Keliyah Spillman, LCSWA [phone]

## 2014-08-23 NOTE — Procedures (Signed)
Patient seen on Hemodialysis. QB 400, UF goal 2L Treatment adjusted as needed.  Zetta Bills MD [facility]. Office # [phone] Pager # [phone] 10:28 AM

## 2014-08-23 NOTE — Progress Notes (Signed)
Patient ID: [name], male   DOB: April 09, 1956, 59 y.o.   MRN: [id number]   [facility] Progress Note   Assessment/ Plan:   1. 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 He will continue with intravenous Fortaz upon discharge at hemodialysis for 4-6 weeks and then chronic suppressive therapy with Augmentin for 3-6 months per ID recommendations. Status post successful thrombectomy yesterday and currently on dialysis without problems-no evidence of graft infection per operative report.. 2.ESRD : on hemodialysis today in place of yesterday-underwent thrombectomy yesterday. 3. 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 Nutrition: Continue to monitor on renal diet/nutritional supplement status post infection/surgery 6. Hypertension: Blood pressures appear to be well controlled, continue to monitor   Subjective:   Reports to be feeling fair-denies any specific complaints except weakness/deconditioning    Objective:   BP 112/68 mmHg  Pulse 76  Temp(Src) 97 F (36.1 C) (Oral)  Resp 18  Ht 5\' 11"  (1.803 m)  Wt 116.5 kg (256 lb 13.4 oz)  BMI 35.84 kg/m2  SpO2 100%  Physical Exam: Gen: Comfortable on hemodialysis CVS: Pulse regular in rate and rhythm, S1 and S2 normal Resp: Clear to auscultation, no rales Abd: Soft, obese, nontender Ext: No lower extremity edema-left thigh graft being used  Labs: BMET  Recent Labs Lab 08/17/14 0615 08/18/14 0400 08/19/14 0400 08/22/14 0600 08/22/14 1848  NA 136 135 135 132* 133*  K 4.0 4.2 4.2 5.5* 4.9  CL 98 97 98 94* 94*  CO2 26 23 25 21 24   GLUCOSE 85 88 95 74 110*  BUN 31* 44* 32* 54* 54*  CREATININE 7.12* 9.00* 6.98* 10.78* 11.26*  CALCIUM 7.6* 7.4* 7.5*  7.2* 6.8*   CBC  Recent Labs Lab 08/17/14 0615 08/18/14 0900 08/19/14 0400 08/22/14 0600  WBC 14.0* 16.2* 13.7* 15.0*  HGB 9.5* 9.7* 9.7* 9.0*  HCT 30.2* 30.6* 31.1* 27.7*  MCV 98.1 98.7 99.4 95.5  PLT 259 272 196 213   Medications:    . antiseptic oral rinse  7 mL Mouth Rinse BID  . aspirin EC  325 mg Oral Daily  . cefTAZidime (FORTAZ)  IV  2 g Intravenous Q M,W,F-HD  . escitalopram  10 mg Oral Daily  . feeding supplement (NEPRO CARB STEADY)  237 mL Oral BID BM  . heparin  5,000 Units Subcutaneous 3 times per day  . insulin aspart  0-9 Units Subcutaneous TID WC  . latanoprost  1 drop Both Eyes QHS  . morphine  30 mg Oral Q12H  . multivitamin  1 tablet Oral QHS  . vancomycin  1,000 mg Intravenous Q M,W,F-HD   Zetta Bills, MD 08/23/2014, 10:00 AM

## 2014-08-23 NOTE — Progress Notes (Signed)
Pt on dialysis  No complaints  500 mL/hr on machine  No evidence of graft infection during thrombectomy yesterday Ok for d/c from our standpoint  Fabienne Bruns, MD [facility] Office: [phone] Pager: [phone]

## 2014-08-28 ENCOUNTER — Non-Acute Institutional Stay (SKILLED_NURSING_FACILITY): Payer: Medicare Other | Admitting: Adult Health

## 2014-08-28 ENCOUNTER — Encounter: Payer: Self-pay | Admitting: Vascular Surgery

## 2014-08-28 DIAGNOSIS — I25119 Atherosclerotic heart disease of native coronary artery with unspecified angina pectoris: Secondary | ICD-10-CM

## 2014-08-28 DIAGNOSIS — R7989 Other specified abnormal findings of blood chemistry: Secondary | ICD-10-CM

## 2014-08-28 DIAGNOSIS — I214 Non-ST elevation (NSTEMI) myocardial infarction: Secondary | ICD-10-CM | POA: Diagnosis not present

## 2014-08-28 DIAGNOSIS — N186 End stage renal disease: Secondary | ICD-10-CM | POA: Diagnosis not present

## 2014-08-28 DIAGNOSIS — Z992 Dependence on renal dialysis: Secondary | ICD-10-CM | POA: Diagnosis not present

## 2014-08-28 DIAGNOSIS — A419 Sepsis, unspecified organism: Secondary | ICD-10-CM | POA: Diagnosis not present

## 2014-08-28 DIAGNOSIS — M4806 Spinal stenosis, lumbar region: Secondary | ICD-10-CM

## 2014-08-28 DIAGNOSIS — M48061 Spinal stenosis, lumbar region without neurogenic claudication: Secondary | ICD-10-CM

## 2014-08-28 DIAGNOSIS — R945 Abnormal results of liver function studies: Secondary | ICD-10-CM

## 2014-08-28 DIAGNOSIS — I959 Hypotension, unspecified: Secondary | ICD-10-CM

## 2014-08-29 ENCOUNTER — Encounter: Payer: Self-pay | Admitting: Vascular Surgery

## 2014-08-29 ENCOUNTER — Ambulatory Visit: Payer: Medicare Other | Admitting: Vascular Surgery

## 2014-08-30 ENCOUNTER — Non-Acute Institutional Stay (SKILLED_NURSING_FACILITY): Payer: Medicare Other | Admitting: Internal Medicine

## 2014-08-30 ENCOUNTER — Ambulatory Visit (INDEPENDENT_AMBULATORY_CARE_PROVIDER_SITE_OTHER): Payer: Self-pay | Admitting: Vascular Surgery

## 2014-08-30 ENCOUNTER — Encounter: Payer: Self-pay | Admitting: Vascular Surgery

## 2014-08-30 VITALS — BP 126/62 | HR 79 | Resp 18 | Ht 71.0 in | Wt 255.7 lb

## 2014-08-30 DIAGNOSIS — R740 Nonspecific elevation of levels of transaminase and lactic acid dehydrogenase [LDH]: Secondary | ICD-10-CM | POA: Diagnosis not present

## 2014-08-30 DIAGNOSIS — M5441 Lumbago with sciatica, right side: Secondary | ICD-10-CM | POA: Diagnosis not present

## 2014-08-30 DIAGNOSIS — N186 End stage renal disease: Secondary | ICD-10-CM | POA: Diagnosis not present

## 2014-08-30 DIAGNOSIS — I214 Non-ST elevation (NSTEMI) myocardial infarction: Secondary | ICD-10-CM

## 2014-08-30 DIAGNOSIS — M5442 Lumbago with sciatica, left side: Secondary | ICD-10-CM

## 2014-08-30 DIAGNOSIS — I10 Essential (primary) hypertension: Secondary | ICD-10-CM | POA: Diagnosis not present

## 2014-08-30 DIAGNOSIS — Z992 Dependence on renal dialysis: Secondary | ICD-10-CM

## 2014-08-30 DIAGNOSIS — M79652 Pain in left thigh: Secondary | ICD-10-CM | POA: Diagnosis not present

## 2014-08-30 DIAGNOSIS — R748 Abnormal levels of other serum enzymes: Secondary | ICD-10-CM

## 2014-08-30 NOTE — Progress Notes (Signed)
Patient is a 59 year old male who returns for postoperative follow-up today. He recently had revision of his left thigh graft with an interposition segment for degenerated bleeding segment of graft. He subsequently had thrombectomy of this graft several days after that. There was suspicion that his graft may have been infected but no cultures ever grew out any bacteria. The patient states his graft is working final dialysis. He has had no drainage from his incisions. He states his left leg is still fairly sore and wanted a few days off from physical therapy for this.  Physical exam:  Filed Vitals:   08/30/14 1235  BP: 126/62  Pulse: 79  Resp: 18  Height: 5\' 11"  (1.803 m)  Weight: 255 lb 11.7 oz (116 kg)  SpO2: 100%    Left thigh all incisions well-healed no drainage no erythema no fluctuance  Assessment: Healing status post thrombectomy and revision of left thigh graft.  Plan: Follow-up on an as-needed basis. Patient will have 3 days off from physical therapy to let his left leg recuperate.  Fabienne Bruns, MD [facility] [place] Office: [phone] Pager: [phone]

## 2014-08-31 ENCOUNTER — Encounter: Payer: Self-pay | Admitting: *Deleted

## 2014-09-04 ENCOUNTER — Encounter: Payer: Self-pay | Admitting: Internal Medicine

## 2014-09-04 NOTE — Progress Notes (Signed)
Patient ID: [name], male   DOB: 09-27-1955, 59 y.o.   MRN: [id number]    HISTORY AND PHYSICAL   DATE: 08/30/14  Location:  [facility]    Place of Service: SNF [phone])   Extended Emergency Contact Information Primary Emergency Contact: [name] Address: [address]          [place], [place] [address] [place] Home Phone: [phone] Relation: Mother Secondary Emergency Contact: [name]  [place] Home Phone: [phone] Relation: Sister  Advanced Directive information  FULL CODE  Chief Complaint  Patient presents with  . Readmit To SNF    HPI:  59 yo male seen today as a readmission into SNF following hospital stay for back pain with sciatica, abnormal ECG, elevated LFTs, NSTEMI, sepsis due to left AVG site abscess, metabolic encephalopathy due to sepsis and ESRD/HD 4 days per week. He underwent myoview stress test which was unremarkable. CK at d/c 3442. Hep C contributing to elevated transaminases. MRI of L spine showed stable chronic changes. Plan to salvage graft site as this is his last viable site. Vascular sx following pt. He did have a small abscess and was tx with IV vanco to be changed to IV fortaz x 4-6 weeks. May need chronic augmentin following completion of IV tx. Gallstones seen on imaging and he was told to f/u with gen sx after d/c  He c/o ,im d/c from left thigh wound. He also c/o numbness in feet and would like hem evaluated by podiatry. He needs OD cats procedure and is followed by eye specialist. No f/c. Appetite is okay. Sleeping ok. No recent falls. No nursing issues. Mental status back to baseline  He has hx CAD/MI/CHF and is taking lipitor, ASA, prasugrel and prn SLNTG. BP controlled. No myalgias  Pain suboptimally controlled. He takes prn roxicodone and uses biofreeze and sulindac. fentanyl patch and gabapentin also rx  Mood stable on lexapro and lorazepam  He uses eye gtts for glaucoma. He  c/o decreased vision on right due to cat.  Acid reflux sx's controlled on PPI  No recent gout attacks. He takes colchicine prn  He takes vitamins and nutritional supplements also  Past Medical History  Diagnosis Date  . Anxiety   . Back pain   . GERD (gastroesophageal reflux disease)   . Peripheral neuropathy   . Arthritis   . Chronic diastolic CHF (congestive heart failure)     a. 04/2013 Echo: EF nl, no rwma, mild LVH.  Marland Kitchen Active smoker   . ESRD on hemodialysis     [name] 4 days per week on M-Tu-Wed and Fri.  Started HD in 1998 and has been on HD initially at [facility], then went to [facility], then in [facility], then to [facility] and now is at [facility] for last 10 years.  Has L thigh AVG.     . Diabetes mellitus     diet controlled  . 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 PONV (postoperative nausea and vomiting)   . Hypertension   . Bell palsy   . Carpal tunnel syndrome     bilateral  . Palpitations   . Headache(784.0)     Hx: of Migraines  . CAD (coronary artery disease)     a. 04/2013 Ant STEMI/PCI: LM nl, LAD 95p (3.0x18 Xience DES), D1/2/3 small, LCX 80ost, RI 20p, RCA 188m CTO (unsuccessful PCI), EF  55%, mod basal inf HK.  . S/P coronary artery stent placement 05/21/2013    Xience Alpine Medtronic conditional 5 @ 1.5 and 3T     Past Surgical History  Procedure Laterality Date  . Total hip arthroplasty    . Thyroidectomy    . Tooth extraction    . Mandible fracture surgery    . Dg av dialysis graft declot or    . Insertion of dialysis catheter  03/28/2011    Procedure: INSERTION OF DIALYSIS CATHETER;  Surgeon: [name] Ochoa, MD;  Location: [facility] OR;  Service: Vascular;  Laterality: Left;  . Av fistula placement  03/31/2011    Procedure: INSERTION OF ARTERIOVENOUS (AV) GORE-TEX GRAFT THIGH;  Surgeon: [name] Kerns, MD;  Location: [facility] OR;  Service: Vascular;  Laterality: Right;  redo right thigh arteriovenous gortex graft using gore-tex stretch  6mm x 70cm  . Thrombectomy w/ embolectomy  06/02/2011    Procedure: THROMBECTOMY ARTERIOVENOUS GORE-TEX GRAFT;  Surgeon: [name] Hint, MD;  Location: [facility] OR;  Service: Vascular;  Laterality: Right;  Thrombectomy right thigh arteriovenous gortex graft;  revision by  replacement of large portion of graft with 7mm gore-tex   . Insertion of dialysis catheter  08/28/2011    Procedure: INSERTION OF DIALYSIS CATHETER;  Surgeon: [name] Hertz, MD;  Location: [facility] OR;  Service: Vascular;  Laterality: Left;  Atempted Bilateral Internal Jugular, Bilateral Subclavin insertion of 55cm Dialysis Catheter Left Femoral  . Insertion of dialysis catheter  12/15/2011    Procedure: INSERTION OF DIALYSIS CATHETER;  Surgeon: [name] Kerns, MD;  Location: [facility] OR;  Service: Vascular;  Laterality: Right;  Insertion of Right Femoral Dialysis Catheter  . Exchange of a dialysis catheter  02/26/2012    Procedure: EXCHANGE OF A DIALYSIS CATHETER;  Surgeon: [name] Earthly, MD;  Location: [facility] OR;  Service: Vascular;  Laterality: Right;  . Joint replacement    . Exchange of a dialysis catheter  05/18/2012    Procedure: EXCHANGE OF A DIALYSIS CATHETER;  Surgeon: [name] Earthly, MD;  Location: [facility] OR;  Service: Vascular;  Laterality: Right;  right femoral dialysis catheter  . Av fistula placement  05/18/2012    Procedure: INSERTION OF ARTERIOVENOUS (AV) GORE-TEX GRAFT THIGH;  Surgeon: [name] Earthly, MD;  Location: [facility] OR;  Service: Vascular;  Laterality: Left;  using 6mm by 70cm goretex graft  . Thrombectomy w/ embolectomy Left 08/25/2012    Procedure: THROMBECTOMY ARTERIOVENOUS GORE-TEX GRAFT;  Surgeon: [name] Ochoa, MD;  Location: [facility] OR;  Service: Vascular;  Laterality: Left;  Attempted thrombectomy of left thigh arteriovenous gortex graft.   . Av fistula placement Left 08/25/2012    Procedure: INSERTION OF ARTERIOVENOUS (AV) GORE-TEX GRAFT THIGH;  Surgeon: [name] Ochoa, MD;  Location: [facility] OR;  Service: Vascular;  Laterality: Left;  Using 6mm  x 40cm vascular Gortex graft.  . Revision of arteriovenous goretex graft Left 12/14/2012    Procedure: Revision of Left Thigh Graft;  Surgeon: [name] Earthly, MD;  Location: [facility] OR;  Service: Vascular;  Laterality: Left;  . Avgg removal Left 02/16/2013    Procedure: EXCISION OF LEFT ARM ARTERIOVENOUS GORETEX GRAFT TIMES 2 WITH VEIN PATCH ANGIOPLASTY OF BRACIAL ARTERY.  ;  Surgeon: [name] Hint, MD;  Location: [name] OR;  Service: Vascular;  Laterality: Left;  Converted from MAC to General.    . Revision of arteriovenous goretex graft Left 08/08/2013    Procedure: REVISION OF LEFT THIGH ARTERIOVENOUS GORETEX GRAFT;  Surgeon:  [name] Kerns, MD;  Location: [facility] OR;  Service: Vascular;  Laterality: Left;  . Venogram Bilateral 10/27/2011    Procedure: VENOGRAM;  Surgeon: Nada Libman, MD;  Location: [facility] CATH LAB;  Service: Cardiovascular;  Laterality: Bilateral;  bilat upper extrem venograms  . Left heart cath Bilateral 05/21/2013    Procedure: LEFT HEART CATH;  Surgeon: Iran Ouch, MD;  Location: [facility] CATH LAB;  Service: Cardiovascular;  Laterality: Bilateral;  . Percutaneous coronary stent intervention (pci-s)  05/21/2013    Procedure: PERCUTANEOUS CORONARY STENT INTERVENTION (PCI-S);  Surgeon: Iran Ouch, MD;  Location: [facility] CATH LAB;  Service: Cardiovascular;;  . Coronary angioplasty with stent placement    . Radiology with anesthesia N/A 06/27/2014    Procedure: MRI LUMBER WITHOUT CONTRAST;  Surgeon: Medication Radiologist, MD;  Location: [facility] OR;  Service: Radiology;  Laterality: N/A;  . Revision of arteriovenous goretex graft Left 08/13/2014    Procedure: REVISION OF ARTERIOVENOUS GORETEX GRAFT LEFT THIGH;  Surgeon: [name] Kerns, MD;  Location: [facility] OR;  Service: Vascular;  Laterality: Left;  . Radiology with anesthesia N/A 08/16/2014    Procedure: MRI;  Surgeon: Medication Radiologist, MD;  Location: [facility] OR;  Service: Radiology;  Laterality: N/A;  . Thrombectomy w/ embolectomy Left 08/22/2014     Procedure: THROMBECTOMY ARTERIOVENOUS GORE-TEX GRAFT Of Left Thigh Graft;  Surgeon: [name] Earthly, MD;  Location: [name] OR;  Service: Vascular;  Laterality: Left;    Patient Care Team: Primitivo Gauze, MD as PCP - General (Nephrology) Primitivo Gauze, MD as Consulting Physician (Nephrology) Julio Sicks, MD (Neurosurgery)  History   Social History  . Marital Status: Single    Spouse Name: N/A  . Number of Children: N/A  . Years of Education: N/A   Occupational History  . Not on file.   Social History Main Topics  . Smoking status: Current Every Day Smoker -- 0.20 packs/day for 40 years    Types: Cigarettes  . Smokeless tobacco: Never Used  . Alcohol Use: No  . Drug Use: No  . Sexual Activity: No   Other Topics Concern  . Not on file   Social History Narrative     reports that he has been smoking Cigarettes.  He has a 8 pack-year smoking history. He has never used smokeless tobacco. He reports that he does not drink alcohol or use illicit drugs.  Family History  Problem Relation Age of Onset  . Diabetes Mother   . Stroke Mother   . Heart disease Mother   . Kidney disease Father   . Hyperlipidemia Father    Family Status  Relation Status Death Age  . Mother Alive   . Father Deceased      There is no immunization history on file for this patient.  No Known Allergies  Medications: Patient's Medications  New Prescriptions   No medications on file  Previous Medications   AMINO ACIDS-PROTEIN HYDROLYS (FEEDING SUPPLEMENT, PRO-STAT SUGAR FREE 64,) LIQD    Take 30 mLs by mouth 2 (two) times daily.   ASPIRIN 81 MG TABLET    Take 1 tablet (81 mg total) by mouth daily.   ATORVASTATIN (LIPITOR) 80 MG TABLET    Take 1 tablet (80 mg total) by mouth daily at 6 PM.   B COMPLEX-C (B-COMPLEX WITH VITAMIN C) TABLET    Take 1 tablet by mouth at bedtime.   CALCIUM CARBONATE (TUMS - DOSED IN MG ELEMENTAL CALCIUM) 500 MG CHEWABLE TABLET    Chew 3  tablets by mouth 3 (three)  times daily.   CEFTAZIDIME IN DEXTROSE 5 % 50 ML    For 5 weeks starting from 08/21/2014. To be stopped by ID physician Dr. Staci Righter   COLCHICINE 0.6 MG TABLET    Take 0.6 mg by mouth every 12 (twelve) hours as needed (gout).    DORZOLAMIDE-TIMOLOL (COSOPT) 22.3-6.8 MG/ML OPHTHALMIC SOLUTION    Place 1 drop into both eyes 3 (three) times daily.   ESCITALOPRAM (LEXAPRO) 10 MG TABLET    Take 10 mg by mouth daily.   FENTANYL (DURAGESIC - DOSED MCG/HR) 50 MCG/HR    Place 1 patch (50 mcg total) onto the skin every 3 (three) days. Apply at bedtime   GABAPENTIN (NEURONTIN) 100 MG CAPSULE    Take 100 mg by mouth every 12 (twelve) hours as needed (neuropathy).    LATANOPROST (XALATAN) 0.005 % OPHTHALMIC SOLUTION    Place 1 drop into both eyes at bedtime.    LOPERAMIDE (IMODIUM) 2 MG CAPSULE    Take 4 mg by mouth as needed for diarrhea or loose stools.   LORAZEPAM (ATIVAN) 0.5 MG TABLET    Take 1 tablet (0.5 mg total) by mouth every 8 (eight) hours as needed for anxiety.   MENTHOL, TOPICAL ANALGESIC, 4 % GEL    Apply 1 application topically every 6 (six) hours as needed (pain). Apply to shoulders/lower back   NITROGLYCERIN (NITROSTAT) 0.4 MG SL TABLET    Place 1 tablet (0.4 mg total) under the tongue every 5 (five) minutes as needed for chest pain.   OMEPRAZOLE (PRILOSEC) 40 MG CAPSULE    Take 40 mg by mouth at bedtime.    OXYCODONE (OXY IR/ROXICODONE) 5 MG IMMEDIATE RELEASE TABLET    Take 1-2 tablets (5-10 mg total) by mouth every 4 (four) hours as needed for moderate pain.   PRASUGREL (EFFIENT) 10 MG TABS TABLET    Take 1 tablet (10 mg total) by mouth daily.   SODIUM BICARBONATE 650 MG TABLET    Take 650 mg by mouth 2 (two) times daily.     SULINDAC (CLINORIL) 200 MG TABLET    Take 200 mg by mouth 2 (two) times daily.  Modified Medications   No medications on file  Discontinued Medications   No medications on file    Review of Systems  Constitutional: Negative for chills, activity change and  fatigue.  HENT: Negative for sore throat and trouble swallowing.   Eyes: Positive for visual disturbance (OD).  Respiratory: Negative for cough, chest tightness and shortness of breath.   Cardiovascular: Positive for leg swelling. Negative for chest pain and palpitations.  Gastrointestinal: Negative for nausea, vomiting, abdominal pain and blood in stool.  Genitourinary: Negative for urgency, frequency and difficulty urinating.  Musculoskeletal: Negative for arthralgias and gait problem.  Skin: Positive for wound. Negative for rash.  Neurological: Positive for numbness (b/l foot). Negative for weakness and headaches.  Psychiatric/Behavioral: Negative for confusion and sleep disturbance. The patient is not nervous/anxious.     Filed Vitals:   08/30/14 1544  BP: 157/64  Pulse: 85  Temp: 97.1 F (36.2 C)  SpO2: 97%   There is no weight on file to calculate BMI.  Physical Exam  Constitutional: He is oriented to person, place, and time. He appears well-developed and well-nourished. No distress.  HENT:  Mouth/Throat: Oropharynx is clear and moist.  Eyes: Pupils are equal, round, and reactive to light. No scleral icterus.  Neck: Neck supple. Carotid bruit is not present.  No thyromegaly present.  Cardiovascular: Normal rate, regular rhythm and intact distal pulses.  Exam reveals no gallop and no friction rub.   Murmur (1/6 SEM) heard. +1 pitting L>RLE edema. No calf TTP. Left AVG in anterior thigh with palpable thrill/audible bruit  Pulmonary/Chest: Effort normal and breath sounds normal. He has no wheezes. He has no rales. He exhibits no tenderness.  Abdominal: Soft. Bowel sounds are normal. He exhibits no distension, no abdominal bruit, no pulsatile midline mass and no mass. There is no tenderness. There is no rebound and no guarding.  Musculoskeletal: He exhibits edema and tenderness.  Lymphadenopathy:    He has no cervical adenopathy.  Neurological: He is alert and oriented to  person, place, and time. He has normal reflexes.  Skin: Skin is warm and dry. No rash noted.     Psychiatric: He has a normal mood and affect. His behavior is normal. Thought content normal.     Labs reviewed: Admission on 08/10/2014, Discharged on 08/23/2014  No results displayed because visit has over 200 results.  CBC Latest Ref Rng 08/22/2014 08/19/2014 08/18/2014  WBC 4.0 - 10.5 K/uL 15.0(H) 13.7(H) 16.2(H)  Hemoglobin 13.0 - 17.0 g/dL 9.0(L) 9.7(L) 9.7(L)  Hematocrit 39.0 - 52.0 % 27.7(L) 31.1(L) 30.6(L)  Platelets 150 - 400 K/uL 213 196 272    CMP Latest Ref Rng 08/22/2014 08/22/2014 08/19/2014  Glucose 70 - 99 mg/dL 161(W) 74 95  BUN 6 - 23 mg/dL 96(E) 45(W) 09(W)  Creatinine 0.50 - 1.35 mg/dL 11.91(Y) 78.29(F) 6.21(H)  Sodium 135 - 145 mmol/L 133(L) 132(L) 135  Potassium 3.5 - 5.1 mmol/L 4.9 5.5(H) 4.2  Chloride 96 - 112 mmol/L 94(L) 94(L) 98  CO2 19 - 32 mmol/L 24 21 25   Calcium 8.4 - 10.5 mg/dL 0.8(M) 7.2(L) 7.5(L)  Total Protein 6.0 - 8.3 g/dL - - 7.1  Total Bilirubin 0.3 - 1.2 mg/dL - - 0.7  Alkaline Phos 39 - 117 U/L - - 114  AST 0 - 37 U/L - - 712(H)  ALT 0 - 53 U/L - - 53   Lab Results  Component Value Date   HGBA1C 5.4 08/13/2014      Admission on 06/19/2014, Discharged on 06/28/2014  No results displayed because visit has over 200 results.      Ct Abdomen Pelvis Wo Contrast  08/13/2014   CLINICAL DATA:  Abnormal liver function tests.  EXAM: CT ABDOMEN AND PELVIS WITHOUT CONTRAST  TECHNIQUE: Multidetector CT imaging of the abdomen and pelvis was performed following the standard protocol without IV contrast.  COMPARISON:  06/03/2010  FINDINGS: There is several tiny calcified granulomas in the liver. Liver parenchyma otherwise appears normal. Multiple stones in the gallbladder. No dilated bile ducts.  Spleen, pancreas, and adrenal glands are normal. Severe bilateral renal atrophy with numerous cysts in both kidneys consistent with chronic renal failure.  The  bowel is normal except for a few diverticula in the ascending colon. Extensive arterial vascular calcification. No free air free fluid or adenopathy. There is a 4 cm fluid collection in the left inguinal region adjacent to the dialysis graft. This probably represents a seroma. Severe degenerative disc disease in the lower lumbar spine with severe spinal stenosis at L3-4 with a broad-based disc protrusion asymmetric into the right neural foramen.  IMPRESSION: No acute abnormality. Granulomas in the liver. Cholelithiasis. Probable seroma in the left inguinal region.   Electronically Signed   By: Francene Boyers M.D.   On: 08/13/2014 07:33   Dg Chest  1 View  08/10/2014   CLINICAL DATA:  Chronic low back pain, now with hip pain  EXAM: CHEST  1 VIEW  COMPARISON:  06/19/2014  FINDINGS: Lungs are essentially clear. No focal consolidation. No pleural effusion or pneumothorax.  The heart is top-normal in size.  Surgical clips overlying the neck.  IMPRESSION: No evidence of acute cardiopulmonary disease.   Electronically Signed   By: Charline Bills M.D.   On: 08/10/2014 13:43   Mr Lumbar Spine Wo Contrast  08/16/2014   CLINICAL DATA:  Dialysis patient with history of hepatitis. Severe back pain. Wound of the left thigh. Question diskitis.  EXAM: MRI LUMBAR SPINE WITHOUT CONTRAST  TECHNIQUE: Multiplanar, multisequence MR imaging of the lumbar spine was performed. No intravenous contrast was administered.  COMPARISON:  MRI lumbar spine 06/27/2014 and 11/06/2011. CT abdomen and pelvis 08/12/2014.  FINDINGS: Bone marrow signal is markedly heterogeneous as seen on the prior examination. Scattered Schmorl's nodes are identified. No fluid within intervertebral disc spaces or marrow signal abnormality to suggest discitis or osteomyelitis is identified. The patient has a congenitally narrow central spinal canal. No fracture is identified. Trace anterolisthesis L3 on L4 is unchanged. Alignment is otherwise normal. Imaged  intra-abdominal contents again demonstrate marked renal atrophy and extensive cyst formation.  The T11-12 and T12-L1 levels are imaged in the sagittal plane only and negative.  L1-2:  Negative.  L2-3: Shallow disc bulge is unchanged. The central canal and foramina appear open.  L3-4: Broad-based disc bulge, ligamentum flavum thickening and facet arthropathy cause severe central canal stenosis. Disc extends into the foramina causing severe bilateral foraminal narrowing. The appearance is not markedly changed since the most recent exam but worsened since the 2013 study.  L4-5: There is a shallow central protrusion and mild facet degenerative disease. The central canal and foramina are mildly narrowed.  L5-S1: Shallow disc bulge without central canal stenosis. The foramina are mildly narrowed.  IMPRESSION: Negative for discitis or osteomyelitis.  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 The appearance is not markedly changed since the most recent exam.   Electronically Signed   By: Drusilla Kanner M.D.   On: 08/16/2014 11:10   US Abdomen Complete  08/11/2014   CLINICAL DATA:  Transaminitis  EXAM: ULTRASOUND ABDOMEN COMPLETE  COMPARISON:  None.  FINDINGS: Gallbladder: Multiple gallstones. Lung 7 mm adherent stone. No gallbladder wall thickening. No pericholecystic fluid.  Common bile duct: Diameter: 5.7 mm.  No evidence of a duct stone.  Liver: No focal lesion identified. Within normal limits in parenchymal echogenicity.  IVC: No abnormality visualized.  Pancreas: Limited visualization.  Portions seen are unremarkable.  Spleen: Size and appearance within normal limits.  Right Kidney: Length: 9.9 cm. Diffusely echogenic with diffuse cortical thinning. No hydronephrosis.  Left Kidney: Length: 9.9 cm. Diffusely echogenic with diffuse cortical thinning. No hydronephrosis. 2.6 cm lower pole cyst.  Abdominal aorta: No aneurysm visualized.   Other findings: None.  IMPRESSION: 1. No acute findings. 2. Cholelithiasis without evidence of acute cholecystitis. 3. End-stage renal disease with bilateral small echogenic kidneys. No hydronephrosis. Small left renal cyst. 4. Normal sonographic appearance of the liver.   Electronically Signed   By: Amie Portland M.D.   On: 08/11/2014 07:50   Mr Femur Left Wo Contrast  08/16/2014   CLINICAL DATA:  End-stage renal disease. Hepatitis-C. Total hip arthroplasty. Drainage from the thigh. Deep infection of the LEFT thigh.  EXAM: MR OF THE  LEFT FEMUR WITHOUT CONTRAST  TECHNIQUE: Multiplanar, multisequence MR imaging was performed. No intravenous contrast was administered.  COMPARISON:  CT 08/12/2014.  Radiographs 10/12/2013.  FINDINGS: There is a large amount of susceptibility artifact in the proximal LEFT femoral diaphysis associated with metallic foreign body identified on prior radiographs. There is no LEFT total hip arthroplasty. Severe LEFT hip osteoarthritis is present. Small LEFT hip effusion and degenerative synovitis. No convincing evidence of septic arthritis. No decubitus ulcers around the LEFT hip.  LEFT thigh AV fistula is present. Cystic lesion is present in the LEFT inguinal region measuring 40 mm x 20 mm on axial imaging. This probably represents a lymphocele or seroma. Ganglion cyst is unlikely given the location. Abscess is also unlikely given the lack of adjacent inflammatory change. Mild LEFT inguinal adenopathy is present.  Myositis is present in the semi tendinosis and biceps femoris of the LEFT thigh. Circumferential edema is present in the proximal LEFT thigh which is nonspecific but commonly associated with cellulitis. Superficial subcutaneous fluid is present along the lateral aspect of the LEFT thigh. This superficial subcutaneous fluid collection measures 5 cm AP, 9 mm thickest in the transverse dimension and 7.5 cm craniocaudal. Infiltrating subcutaneous edema is present more proximally.  This subcutaneous lesion may represent a small abscess in a patient with a history of sepsis. The fluid overlies the midportion of the vastus lateralis muscle. No deep soft tissue infection is identified.  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 This has a slightly different distribution than in the contralateral LEFT side. No my necrosis or deep soft tissue abscess in the incidentally visualized LEFT thigh.  ORIF of the RIGHT proximal femur is present with cannulated lag screws producing susceptibility artifact. 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  IMPRESSION: 1. Severe LEFT hip osteoarthritis. Cannulated lag screws in the RIGHT hip. 2. Susceptibility artifact associated with metallic foreign body in the proximal LEFT femoral diaphysis degrading the evaluation of the deep soft tissues in this region. 3. No deep soft tissue abscess. 4. Small thin lateral LEFT thigh subcutaneous fluid collection measuring only about 1 cm in thickness overlies the vastus lateralis. This is nonspecific but could represent a small abscess. 5. Diffuse subcutaneous edema of the LEFT thigh, which may represent cellulitis in the appropriate clinical setting. 6. Nonspecific myositis of the hamstrings bilaterally. In the setting of prior sepsis, this is most likely infectious.   Electronically Signed   By: Andreas Newport M.D.   On: 08/16/2014 11:15   Nm Myocar Multi W/spect W/wall Motion / Ef  08/11/2014   CLINICAL DATA:  59 year old male with a history of palpitations.  Cardiovascular risk factors include smoking, diabetes, hypertension, coronary artery disease.  EXAM: MYOCARDIAL IMAGING WITH SPECT (REST AND PHARMACOLOGIC-STRESS)  GATED LEFT VENTRICULAR WALL MOTION STUDY  LEFT VENTRICULAR EJECTION FRACTION  TECHNIQUE: Standard myocardial SPECT imaging was performed after resting intravenous injection of 10 mCi Tc-17m sestamibi. Subsequently, intravenous infusion of Lexiscan was performed under the supervision of the  Cardiology staff. At peak effect of the drug, 30 mCi Tc-74m sestamibi was injected intravenously and standard myocardial SPECT imaging was performed. Quantitative gated imaging was also performed to evaluate left ventricular wall motion, and estimate left ventricular ejection fraction.  COMPARISON:  None.  FINDINGS: Perfusion: No decreased activity in the left ventricle on stress imaging to suggest reversible ischemia or infarction.  Wall Motion: Hypokinesia of the inferior septal wall.  Left Ventricular Ejection Fraction: 52 %  End diastolic volume 129 ml  End systolic volume 62 ml  IMPRESSION: 1. No reversible ischemia or infarction.  2. Hypokinesia of the inferior septal wall.  3. Left ventricular ejection fraction 52%  4. Low-risk stress test findings*.  *2012 Appropriate Use Criteria for Coronary Revascularization Focused Update: J Am Coll Cardiol. 2012;59(9):857-881. http://content.dementiazones.com.aspx?articleid=1201161   Electronically Signed   By: Gilmer Mor D.O.   On: 08/11/2014 13:52   Dg Chest Port 1 View  08/16/2014   CLINICAL DATA:  Shortness of breath for 1 day.  EXAM: PORTABLE CHEST - 1 VIEW  COMPARISON:  08/10/2014.  FINDINGS: Stable enlarged cardiac silhouette. Interval mild prominence of the pulmonary vasculature. Stable mildly prominent interstitial markings. No pleural fluid. Thoracic spine degenerative changes. Bilateral neck surgical clips.  IMPRESSION: 1. Interval mild pulmonary vascular congestion with stable cardiomegaly. 2. Stable mild chronic interstitial lung disease.   Electronically Signed   By: Beckie Salts M.D.   On: 08/16/2014 20:15   Dg Foot Complete Left  08/12/2014   CLINICAL DATA:  Pt has a diabetic foot ulcer on the plantar surface of his 1st toe. Pt was unable to move foot or hold foot in position  EXAM: LEFT FOOT - COMPLETE 3+ VIEW  COMPARISON:  None.  FINDINGS: No fracture. Joints are normally aligned. There are no areas of bone resorption to suggest  osteomyelitis.  Vascular calcifications are noted. Soft tissues otherwise unremarkable.  IMPRESSION: 1. No fracture.  No evidence of osteomyelitis.   Electronically Signed   By: Amie Portland M.D.   On: 08/12/2014 14:44   US Abdomen Limited Ruq  08/17/2014   CLINICAL DATA:  Fever, increased WBC, increased LFT  EXAM: US ABDOMEN LIMITED - RIGHT UPPER QUADRANT  COMPARISON:  None.  FINDINGS: Gallbladder:  Multiple gallstones are noted within gallbladder largest measures 6 mm. No thickening of gallbladder wall. No sonographic Murphy's sign  Common bile duct:  Diameter: 5.6 mm in diameter within normal limits  Liver:  No focal lesion identified. Within normal limits in parenchymal echogenicity.  IMPRESSION: 1. Multiple gallstones are noted within gallbladder. 2. No thickening of gallbladder wall.  Normal CBD.   Electronically Signed   By: Natasha Mead M.D.   On: 08/17/2014 08:03     Assessment/Plan   ICD-9-CM ICD-10-CM   1. Bilateral low back pain with sciatica, sciatica laterality unspecified 724.3 M54.41     M54.42   2. NSTEMI (non-ST elevated myocardial infarction) - stable 410.70 I21.4   3. Pain of left thigh - due to groin wound 729.5 M79.652   4. End-stage renal disease on hemodialysis MTWF 585.6 N18.6    V45.11 Z99.2   5. Essential hypertension - stable 401.9 I10   6. Abnormal transaminases 790.4 R74.0    --check CBC w diff, CMP and total CK  --podiatry eval for b/l foot paresthesias  --f/u with opththamology for OD cat  --f/u with HD as scheduled on [facility]WF  --cont fluid restriction 1200cc  --f/u with vascular sx and ID as scheduled  --PT/OT/ST as ordered  --cont current meds as ordered  --GOAL: short term rehab and d/c home when medically appropriate. Communicated with pt and nursing.   Loreli Debruler S. Ancil Linsey  [facility] [address] [place], [place] [address] [phone] Cell (Monday-Friday 8 AM - 5 PM) [phone] After 5  PM and follow prompts

## 2014-09-10 ENCOUNTER — Other Ambulatory Visit: Payer: Self-pay | Admitting: *Deleted

## 2014-09-10 MED ORDER — OXYCODONE HCL 5 MG PO TABS: 5.0000 mg | ORAL_TABLET | ORAL | Status: DC | PRN

## 2014-09-10 MED ORDER — FENTANYL 50 MCG/HR TD PT72: MEDICATED_PATCH | TRANSDERMAL | Status: DC

## 2014-09-10 NOTE — Telephone Encounter (Signed)
[facility] 

## 2014-09-19 ENCOUNTER — Emergency Department (HOSPITAL_COMMUNITY)
Admission: EM | Admit: 2014-09-19 | Discharge: 2014-09-19 | Disposition: A | Discharge status: Home / Self Care | Payer: Medicare Other | Attending: Emergency Medicine | Admitting: Emergency Medicine

## 2014-09-19 ENCOUNTER — Encounter (HOSPITAL_COMMUNITY): Payer: Self-pay | Admitting: *Deleted

## 2014-09-19 DIAGNOSIS — Z9861 Coronary angioplasty status: Secondary | ICD-10-CM | POA: Diagnosis not present

## 2014-09-19 DIAGNOSIS — Z7982 Long term (current) use of aspirin: Secondary | ICD-10-CM | POA: Diagnosis not present

## 2014-09-19 DIAGNOSIS — E119 Type 2 diabetes mellitus without complications: Secondary | ICD-10-CM | POA: Insufficient documentation

## 2014-09-19 DIAGNOSIS — I2510 Atherosclerotic heart disease of native coronary artery without angina pectoris: Secondary | ICD-10-CM | POA: Diagnosis not present

## 2014-09-19 DIAGNOSIS — G629 Polyneuropathy, unspecified: Secondary | ICD-10-CM | POA: Diagnosis not present

## 2014-09-19 DIAGNOSIS — T82838A Hemorrhage of vascular prosthetic devices, implants and grafts, initial encounter: Secondary | ICD-10-CM | POA: Diagnosis present

## 2014-09-19 DIAGNOSIS — I5032 Chronic diastolic (congestive) heart failure: Secondary | ICD-10-CM | POA: Insufficient documentation

## 2014-09-19 DIAGNOSIS — T8249XA Other complication of vascular dialysis catheter, initial encounter: Secondary | ICD-10-CM | POA: Diagnosis not present

## 2014-09-19 DIAGNOSIS — F419 Anxiety disorder, unspecified: Secondary | ICD-10-CM | POA: Insufficient documentation

## 2014-09-19 DIAGNOSIS — Z79899 Other long term (current) drug therapy: Secondary | ICD-10-CM | POA: Diagnosis not present

## 2014-09-19 DIAGNOSIS — I120 Hypertensive chronic kidney disease with stage 5 chronic kidney disease or end stage renal disease: Secondary | ICD-10-CM | POA: Insufficient documentation

## 2014-09-19 DIAGNOSIS — Z720 Tobacco use: Secondary | ICD-10-CM | POA: Diagnosis not present

## 2014-09-19 DIAGNOSIS — N186 End stage renal disease: Secondary | ICD-10-CM | POA: Diagnosis not present

## 2014-09-19 DIAGNOSIS — Y841 Kidney dialysis as the cause of abnormal reaction of the patient, or of later complication, without mention of misadventure at the time of the procedure: Secondary | ICD-10-CM | POA: Diagnosis not present

## 2014-09-19 DIAGNOSIS — T829XXA Unspecified complication of cardiac and vascular prosthetic device, implant and graft, initial encounter: Secondary | ICD-10-CM

## 2014-09-19 DIAGNOSIS — M1990 Unspecified osteoarthritis, unspecified site: Secondary | ICD-10-CM | POA: Insufficient documentation

## 2014-09-19 DIAGNOSIS — K219 Gastro-esophageal reflux disease without esophagitis: Secondary | ICD-10-CM | POA: Diagnosis not present

## 2014-09-19 MED ORDER — OXYCODONE HCL 5 MG PO TABS
5.0000 mg | ORAL_TABLET | Freq: Once | ORAL | Status: AC
  Administered 2014-09-19: 5 mg via ORAL
  Filled 2014-09-19: qty 1

## 2014-09-19 NOTE — ED Provider Notes (Signed)
CSN: [id number]     Arrival date & time 09/19/14  1020 History   First MD Initiated Contact with Patient 09/19/14 1049     Chief Complaint  Patient presents with  . Coagulation Disorder     (Consider location/radiation/quality/duration/timing/severity/associated sxs/prior Treatment) HPI Comments: Patient is a 59 year old male who presents with a bleeding left femoral graft that started during hemodialysis today. Symptoms started suddenly after he had 846 mL taken off.Patient reports left lower extremity swelling for the past 2 days. Patient denies any pain or any other symptoms. Pressure dressing applied which provided some relief. No aggravating factors. No other associated symptoms.    Past Medical History  Diagnosis Date  . Anxiety   . Back pain   . GERD (gastroesophageal reflux disease)   . Peripheral neuropathy   . Arthritis   . Chronic diastolic CHF (congestive heart failure)     a. 04/2013 Echo: EF nl, no rwma, mild LVH.  Marland Kitchen Active smoker   . ESRD on hemodialysis     [name] 4 days per week on M-Tu-Wed and Fri.  Started HD in 1998 and has been on HD initially at [facility], then went to [facility], then in [facility], then to [facility] and now is at [facility] for last 10 years.  Has L thigh AVG.     . Diabetes mellitus     diet controlled  . 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 PONV (postoperative nausea and vomiting)   . Hypertension   . Bell palsy   . Carpal tunnel syndrome     bilateral  . Palpitations   . Headache(784.0)     Hx: of Migraines  . CAD (coronary artery disease)     a. 04/2013 Ant STEMI/PCI: LM nl, LAD 95p (3.0x18 Xience DES), D1/2/3 small, LCX 80ost, RI 20p, RCA 113m CTO (unsuccessful PCI), EF 55%, mod basal inf HK.  . S/P coronary artery stent placement 05/21/2013    Xience Alpine Medtronic conditional 5 @ 1.5 and 3T    Past Surgical History  Procedure Laterality Date  . Total hip arthroplasty    . Thyroidectomy    . Tooth  extraction    . Mandible fracture surgery    . Dg av dialysis graft declot or    . Insertion of dialysis catheter  03/28/2011    Procedure: INSERTION OF DIALYSIS CATHETER;  Surgeon: Pryor Ochoa, MD;  Location: [facility] OR;  Service: Vascular;  Laterality: Left;  . Av fistula placement  03/31/2011    Procedure: INSERTION OF ARTERIOVENOUS (AV) GORE-TEX GRAFT THIGH;  Surgeon: Sherren Kerns, MD;  Location: [facility] OR;  Service: Vascular;  Laterality: Right;  redo right thigh arteriovenous gortex graft using gore-tex stretch 22mm x 70cm  . Thrombectomy w/ embolectomy  06/02/2011    Procedure: THROMBECTOMY ARTERIOVENOUS GORE-TEX GRAFT;  Surgeon: Chuck Hint, MD;  Location: [facility] OR;  Service: Vascular;  Laterality: Right;  Thrombectomy right thigh arteriovenous gortex graft;  revision by  replacement of large portion of graft with 67mm gore-tex   . Insertion of dialysis catheter  08/28/2011    Procedure: INSERTION OF DIALYSIS CATHETER;  Surgeon: Fransisco Hertz, MD;  Location: [facility] OR;  Service: Vascular;  Laterality: Left;  Atempted Bilateral Internal Jugular, Bilateral Subclavin insertion of 55cm Dialysis Catheter Left Femoral  . Insertion of dialysis catheter  12/15/2011    Procedure: INSERTION OF DIALYSIS CATHETER;  Surgeon:  Sherren Kerns, MD;  Location: [facility] OR;  Service: Vascular;  Laterality: Right;  Insertion of Right Femoral Dialysis Catheter  . Exchange of a dialysis catheter  02/26/2012    Procedure: EXCHANGE OF A DIALYSIS CATHETER;  Surgeon: Larina Earthly, MD;  Location[facility] OR;  Service: Vascular;  Laterality: Right;  . Joint replacement    . Exchange of a dialysis catheter  05/18/2012    Procedure: EXCHANGE OF A DIALYSIS CATHETER;  Surgeon: Larina Earthly, MD;  Location: [facility] OR;  Service: Vascular;  Laterality: Right;  right femoral dialysis catheter  . Av fistula placement  05/18/2012    Procedure: INSERTION OF ARTERIOVENOUS (AV) GORE-TEX GRAFT THIGH;  Surgeon: Larina Earthly, MD;  Location: [facility] OR;  Service:  Vascular;  Laterality: Left;  using 6mm by 70cm goretex graft  . Thrombectomy w/ embolectomy Left 08/25/2012    Procedure: THROMBECTOMY ARTERIOVENOUS GORE-TEX GRAFT;  Surgeon: Pryor Ochoa, MD;  Location: [facility] OR;  Service: Vascular;  Laterality: Left;  Attempted thrombectomy of left thigh arteriovenous gortex graft.   . Av fistula placement Left 08/25/2012    Procedure: INSERTION OF ARTERIOVENOUS (AV) GORE-TEX GRAFT THIGH;  Surgeon: Pryor Ochoa, MD;  Location: [facility] OR;  Service: Vascular;  Laterality: Left;  Using 6mm x 40cm vascular Gortex graft.  . Revision of arteriovenous goretex graft Left 12/14/2012    Procedure: Revision of Left Thigh Graft;  Surgeon: Larina Earthly, MD;  Location: [facility] OR;  Service: Vascular;  Laterality: Left;  . Avgg removal Left 02/16/2013    Procedure: EXCISION OF LEFT ARM ARTERIOVENOUS GORETEX GRAFT TIMES 2 WITH VEIN PATCH ANGIOPLASTY OF BRACIAL ARTERY.  ;  Surgeon: Chuck Hint, MD;  Location: [facility] OR;  Service: Vascular;  Laterality: Left;  Converted from MAC to General.    . Revision of arteriovenous goretex graft Left 08/08/2013    Procedure: REVISION OF LEFT THIGH ARTERIOVENOUS GORETEX GRAFT;  Surgeon: Sherren Kerns, MD;  Location: [facility] OR;  Service: Vascular;  Laterality: Left;  . Venogram Bilateral 10/27/2011    Procedure: VENOGRAM;  Surgeon: Nada Libman, MD;  Location: [facility] CATH LAB;  Service: Cardiovascular;  Laterality: Bilateral;  bilat upper extrem venograms  . Left heart cath Bilateral 05/21/2013    Procedure: LEFT HEART CATH;  Surgeon: Iran Ouch, MD;  Location: [facility] CATH LAB;  Service: Cardiovascular;  Laterality: Bilateral;  . Percutaneous coronary stent intervention (pci-s)  05/21/2013    Procedure: PERCUTANEOUS CORONARY STENT INTERVENTION (PCI-S);  Surgeon: Iran Ouch, MD;  Location: [facility] CATH LAB;  Service: Cardiovascular;;  . Coronary angioplasty with stent placement    . Radiology with anesthesia N/A 06/27/2014    Procedure: MRI LUMBER WITHOUT  CONTRAST;  Surgeon: Medication Radiologist, MD;  Location: [facility] OR;  Service: Radiology;  Laterality: N/A;  . Revision of arteriovenous goretex graft Left 08/13/2014    Procedure: REVISION OF ARTERIOVENOUS GORETEX GRAFT LEFT THIGH;  Surgeon: Sherren Kerns, MD;  Location: [facility] OR;  Service: Vascular;  Laterality: Left;  . Radiology with anesthesia N/A 08/16/2014    Procedure: MRI;  Surgeon: Medication Radiologist, MD;  Location: [facility] OR;  Service: Radiology;  Laterality: N/A;  . Thrombectomy w/ embolectomy Left 08/22/2014    Procedure: THROMBECTOMY ARTERIOVENOUS GORE-TEX GRAFT Of Left Thigh Graft;  Surgeon: Larina Earthly, MD;  Location: [facility] OR;  Service: Vascular;  Laterality: Left;   Family History  Problem Relation Age of Onset  . Diabetes Mother   . Stroke Mother   .  Heart disease Mother   . Kidney disease Father   . Hyperlipidemia Father    History  Substance Use Topics  . Smoking status: Current Every Day Smoker -- 0.20 packs/day for 40 years    Types: Cigarettes  . Smokeless tobacco: Never Used  . Alcohol Use: No    Review of Systems  Constitutional: Negative for fever, chills and fatigue.  HENT: Negative for trouble swallowing.   Eyes: Negative for visual disturbance.  Respiratory: Negative for shortness of breath.   Cardiovascular: Negative for chest pain and palpitations.  Gastrointestinal: Negative for nausea, vomiting, abdominal pain and diarrhea.  Genitourinary: Negative for dysuria and difficulty urinating.  Musculoskeletal: Negative for arthralgias and neck pain.  Skin: Positive for wound. Negative for color change.  Neurological: Negative for dizziness and weakness.  Psychiatric/Behavioral: Negative for dysphoric mood.      Allergies  Review of patient's allergies indicates no known allergies.  Home Medications   Prior to Admission medications   Medication Sig Start Date End Date Taking? Authorizing Provider  Amino Acids-Protein Hydrolys (FEEDING SUPPLEMENT,  PRO-STAT SUGAR FREE 64,) LIQD Take 30 mLs by mouth 2 (two) times daily.    Historical Provider, MD  aspirin 81 MG tablet Take 1 tablet (81 mg total) by mouth daily. 05/24/13   Rhonda G Barrett, PA-C  atorvastatin (LIPITOR) 80 MG tablet Take 1 tablet (80 mg total) by mouth daily at 6 PM. 05/24/13   Rhonda G Barrett, PA-C  B Complex-C (B-COMPLEX WITH VITAMIN C) tablet Take 1 tablet by mouth at bedtime.    Historical Provider, MD  calcium carbonate (TUMS - DOSED IN MG ELEMENTAL CALCIUM) 500 MG chewable tablet Chew 3 tablets by mouth 3 (three) times daily.    Historical Provider, MD  cefTAZidime in dextrose 5 % 50 mL For 5 weeks starting from 08/21/2014. To be stopped by ID physician Dr. Staci Righter 08/21/14   Leroy Sea, MD  colchicine 0.6 MG tablet Take 0.6 mg by mouth every 12 (twelve) hours as needed (gout).     Historical Provider, MD  dorzolamide-timolol (COSOPT) 22.3-6.8 MG/ML ophthalmic solution Place 1 drop into both eyes 3 (three) times daily.    Historical Provider, MD  escitalopram (LEXAPRO) 10 MG tablet Take 10 mg by mouth daily.    Historical Provider, MD  fentaNYL (DURAGESIC - DOSED MCG/HR) 50 MCG/HR Apply one patch topically every 72 hours for pain. Remove old patch 09/10/14   Margit Hanks, MD  gabapentin (NEURONTIN) 100 MG capsule Take 100 mg by mouth every 12 (twelve) hours as needed (neuropathy).  12/29/13   Historical Provider, MD  latanoprost (XALATAN) 0.005 % ophthalmic solution Place 1 drop into both eyes at bedtime.  04/20/14   Historical Provider, MD  loperamide (IMODIUM) 2 MG capsule Take 4 mg by mouth as needed for diarrhea or loose stools.    Historical Provider, MD  LORazepam (ATIVAN) 0.5 MG tablet Take 1 tablet (0.5 mg total) by mouth every 8 (eight) hours as needed for anxiety. 08/21/14   Leroy Sea, MD  Menthol, Topical Analgesic, 4 % GEL Apply 1 application topically every 6 (six) hours as needed (pain). Apply to shoulders/lower back    Historical Provider, MD   nitroGLYCERIN (NITROSTAT) 0.4 MG SL tablet Place 1 tablet (0.4 mg total) under the tongue every 5 (five) minutes as needed for chest pain. Patient not taking: Reported on 08/11/2014 05/24/13   Joline Salt Barrett, PA-C  omeprazole (PRILOSEC) 40 MG capsule Take 40 mg by  mouth at bedtime.     Historical Provider, MD  oxyCODONE (OXY IR/ROXICODONE) 5 MG immediate release tablet Take 1-2 tablets (5-10 mg total) by mouth every 4 (four) hours as needed for moderate pain. 09/10/14   Margit Hanks, MD  prasugrel (EFFIENT) 10 MG TABS tablet Take 1 tablet (10 mg total) by mouth daily. Patient not taking: Reported on 08/30/2014 06/16/13   Chrystie Nose, MD  sodium bicarbonate 650 MG tablet Take 650 mg by mouth 2 (two) times daily.      Historical Provider, MD  sulindac (CLINORIL) 200 MG tablet Take 200 mg by mouth 2 (two) times daily.    Historical Provider, MD   BP 162/92 mmHg  Pulse 85  Temp(Src) 98.3 F (36.8 C) (Oral)  Resp 18  SpO2 95% Physical Exam  Constitutional: He is oriented to person, place, and time. He appears well-developed and well-nourished. No distress.  HENT:  Head: Normocephalic and atraumatic.  Eyes: Conjunctivae and EOM are normal.  Neck: Normal range of motion.  Cardiovascular: Normal rate and regular rhythm.  Exam reveals no gallop and no friction rub.   No murmur heard. Pulmonary/Chest: Effort normal and breath sounds normal. He has no wheezes. He has no rales. He exhibits no tenderness.  Abdominal: Soft. He exhibits no distension. There is no tenderness. There is no rebound.  Musculoskeletal: Normal range of motion.  Neurological: He is alert and oriented to person, place, and time. Coordination normal.  Speech is goal-oriented. Moves limbs without ataxia.   Skin: Skin is warm and dry.  Left femoral graft oozing.   Psychiatric: He has a normal mood and affect. His behavior is normal.  Nursing note and vitals reviewed.   ED Course  Procedures (including critical care  time) Labs Review Labs Reviewed - No data to display  Imaging Review No results found.   EKG Interpretation None      MDM   Final diagnoses:  Complication of arteriovenous dialysis fistula, initial encounter    11:08 AM Vitals stable and patient afebrile. IV team tending to the patient's graft.   11:25 AM Dr. Arbie Cookey will see the patient.   2:26 PM Vascular surgeon placed 2 sutures which controlled the bleeding. Patient will be discharged with instructions to return to dialysis tomorrow.   Emilia Beck, PA-C 09/19/14 1427  Lorre Nick, MD 09/22/14 [phone]

## 2014-09-19 NOTE — Progress Notes (Signed)
Patient ID: [name], male   DOB: 1956-01-10, 59 y.o.   MRN: [id number] Aspect of his left thigh graft. Hemodialysis today and had some pain. He presented to the emergency room with bleeding. On physical exam he did have a bleeding from one of his puncture sites. The other needle still in place. This was not an area of degeneration. Unclear as to how much heparin he got with hemodialysis today. With the direct digital pressure this was controlled with continued bleeding from the puncture site. This reason the graft was compressed further proximally and under sterile conditions with Betadine prep a 30 proline suture was used as a pursestring around this bleeding site for control. The second needle was pulled and pressure was held for hemostasis. Will be discharged by emergency room physician

## 2014-09-19 NOTE — ED Notes (Signed)
Pt in from HD via [facility], per report the pt was getting HD today & had 846 mL taken off today with goal of 2300 mL, pt reported to have mod amt of bleeding from graft site in L thigh, pressure dressing applied pta & bleeding controlled, pt A&O x4, pt has edema to L lower extremity noticed over the last 2 days, +DP pulse on L lower extremity, pt uses wheelchair at baseline

## 2014-09-19 NOTE — ED Notes (Signed)
Pt removes O2 probe, pt states, "It hurts my finger & I am taking it off." pt encouraged to keep probe on for monitoring purposes

## 2014-09-19 NOTE — ED Notes (Signed)
[facility] @ 2:27pm eta 25 mins.

## 2014-09-19 NOTE — Discharge Instructions (Signed)
Follow up with your doctor for further evaluation. Return to dialysis tomorrow for further treatment.

## 2014-09-25 NOTE — Progress Notes (Signed)
Patient ID: [name], male   DOB: [birth year], 59 y.o.   MRN: [id number]  [facility]     No Known Allergies     Chief Complaint  Patient presents with  . Hospitalization Follow-up    HPI:  He has been hospitalized for left thigh abscess; elevated liver enzymes and rhabdomylosis. His left groin is the last available site for his hemodialysis; he is dialyzed 4 times weekly. At this time his goal is short term rehab. He is not voicing any complaints at this time.    Past Medical History  Diagnosis Date  . Anxiety   . Back pain   . GERD (gastroesophageal reflux disease)   . Peripheral neuropathy   . Arthritis   . Chronic diastolic CHF (congestive heart failure)     a. 04/2013 Echo: EF nl, no rwma, mild LVH.  Marland Kitchen Active smoker   . ESRD on hemodialysis     [name] 4 days per week on M-Tu-Wed and Fri.  Started HD in 1998 and has been on HD initially at [facility], then went to [facility], then in [facility], then to [facility] and now is at [facility] for last 10 years.  Has L thigh AVG.     . Diabetes mellitus     diet controlled  . 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 PONV (postoperative nausea and vomiting)   . Hypertension   . Bell palsy   . Carpal tunnel syndrome     bilateral  . Palpitations   . Headache(784.0)     Hx: of Migraines  . CAD (coronary artery disease)     a. 04/2013 Ant STEMI/PCI: LM nl, LAD 95p (3.0x18 Xience DES), D1/2/3 small, LCX 80ost, RI 20p, RCA 188mCTO (unsuccessful PCI), EF 55%, mod basal inf HK.  . S/P coronary artery stent placement 05/21/2013    Xience Alpine Medtronic conditional 5 @ 1.5 and 3T     Past Surgical History  Procedure Laterality Date  . Total hip arthroplasty    . Thyroidectomy    . Tooth extraction    . Mandible fracture surgery    . Dg av dialysis graft declot or    . Insertion of dialysis catheter  03/28/2011    Procedure: INSERTION OF DIALYSIS CATHETER;  Surgeon: JMal Misty MD;  [place]   Service: Vascular;  Laterality: Left;  . Av fistula placement  03/31/2011    Procedure: INSERTION OF ARTERIOVENOUS (AV) GORE-TEX GRAFT THIGH;  Surgeon: CElam Dutch MD;  [facility];  Service: Vascular;  Laterality: Right;  redo right thigh arteriovenous gortex graft using gore-tex stretch 631mx 70cm  . Thrombectomy w/ embolectomy  06/02/2011    Procedure: THROMBECTOMY ARTERIOVENOUS GORE-TEX GRAFT;  Surgeon: ChAngelia MouldMD;  [place] [facility]: [place];  Laterality: Right;  Thrombectomy right thigh arteriovenous gortex graft;  revision by  replacement of large portion of graft with 56m62more-tex   . Insertion of dialysis catheter  08/28/2011    Procedure: INSERTION OF DIALYSIS CATHETER;  Surgeon: BriConrad BurlingtonD;  [facility] [place];  Laterality: Left;  Atempted Bilateral Internal Jugular, Bilateral Subclavin insertion of 55cm Dialysis Catheter Left Femoral  . Insertion of dialysis catheter  12/15/2011    Procedure: INSERTION OF DIALYSIS CATHETER;  Surgeon: ChaElam DutchD;  [facility] [place] [place];  Laterality: Right;  Insertion of  Right Femoral Dialysis Catheter  . Exchange of a dialysis catheter  02/26/2012    Procedure: EXCHANGE OF A DIALYSIS CATHETER;  Surgeon: Rosetta Posner, MD;  Location: [place];  Service: Vascular;  Laterality: Right;  . Joint replacement    . Exchange of a dialysis catheter  05/18/2012    Procedure: EXCHANGE OF A DIALYSIS CATHETER;  Surgeon: Rosetta Posner, MD;  Location: [place];  Service: Vascular;  Laterality: Right;  right femoral dialysis catheter  . Av fistula placement  05/18/2012    Procedure: INSERTION OF ARTERIOVENOUS (AV) GORE-TEX GRAFT THIGH;  Surgeon: Rosetta Posner, MD;  Location: [facility] OR;  Service: Vascular;  Laterality: Left;  using 41m by 70cm goretex graft  . Thrombectomy w/ embolectomy Left 08/25/2012    Procedure: THROMBECTOMY ARTERIOVENOUS GORE-TEX GRAFT;  Surgeon: JMal Misty MD;  [place]  Service:  Vascular;  Laterality: Left;  Attempted thrombectomy of left thigh arteriovenous gortex graft.   . Av fistula placement Left 08/25/2012    Procedure: INSERTION OF ARTERIOVENOUS (AV) GORE-TEX GRAFT THIGH;  Surgeon: JMal Misty MD;  [facility];  Service: Vascular;  Laterality: Left;  Using 657mx 40cm vascular Gortex graft.  . Revision of arteriovenous goretex graft Left 12/14/2012    Procedure: Revision of Left Thigh Graft;  Surgeon: ToRosetta PosnerMD;  [place] Service: Vascular;  Laterality: Left;  . Avgg removal Left 02/16/2013    Procedure: EXCISION OF LEFT ARM ARTERIOVENOUS GORETEX GRAFT TIMES 2 WITH VEIN PATCH ANGIOPLASTY OF BRACIAL ARTERY.  ;  Surgeon: ChAngelia MouldMD;  [place] [facility] [facility]: Vascular;  Laterality: Left;  [facility]    . Revision of arteriovenous goretex graft Left 08/08/2013    Procedure: REVISION OF LEFT THIGH ARTERIOVENOUS GORETEX GRAFT;  Surgeon: ChElam DutchMD;  [place] Service: Vascular;  Laterality: Left;  . Venogram Bilateral 10/27/2011    Procedure: VENOGRAM;  Surgeon: VaSerafina MitchellMD;  [facility];  Service: Cardiovascular;  Laterality: Bilateral;  bilat upper extrem venograms  . Left heart cath Bilateral 05/21/2013    Procedure: LEFT HEART CATH;  Surgeon: MuWellington HampshireMD;  [facility];  Service: Cardiovascular;  Laterality: Bilateral;  . Percutaneous coronary stent intervention (pci-s)  05/21/2013    Procedure: PERCUTANEOUS CORONARY STENT INTERVENTION (PCI-S);  Surgeon: MuWellington HampshireMD;  [facility];  Service: Cardiovascular;;  . Coronary angioplasty with stent placement    . Radiology with anesthesia N/A 06/27/2014    Procedure: MRI LUMBER WITHOUT CONTRAST;  Surgeon: Medication Radiologist, MD;  [place] [facility]: Radiology;  Laterality: N/A;  . Revision of arteriovenous goretex graft Left 08/13/2014    Procedure: REVISION OF ARTERIOVENOUS GORETEX GRAFT LEFT  THIGH;  Surgeon: ChElam DutchMD;  [place] V[place];  Laterality: Left;  . Radiology with anesthesia N/A 08/16/2014    Procedure: MRI;  Surgeon: Medication Radiologist, MD;  [place] [facility] [facility]: Radiology;  Laterality: N/A;  . Thrombectomy w/ embolectomy Left 08/22/2014    Procedure: THROMBECTOMY ARTERIOVENOUS GORE-TEX GRAFT Of Left Thigh Graft;  Surgeon: ToRosetta PosnerMD;  [facility];  Service: Vascular;  Laterality: Left;    VITAL SIGNS BP 157/64 mmHg  Pulse 85  Ht _0  (1.803 m)  Wt 255 lb (115.667 kg)  BMI 35.58 kg/m2  SpO2 97%   Outpatient Encounter Prescriptions as of  08/28/2014  Medication Sig   biofreeze Apply to back and shoulders every 6 hours as needed   duragesic 50 mcg 1 patch every 3 days   Oxycodone 5 mg tabs 5 or 10 mg every 4 hours as needed for pain   . Amino Acids-Protein Hydrolys (FEEDING SUPPLEMENT, PRO-STAT SUGAR FREE 64,) LIQD Take 30 mLs by mouth 2 (two) times daily.  Marland Kitchen aspirin 81 MG tablet Take 1 tablet (81 mg total) by mouth daily.  Marland Kitchen atorvastatin (LIPITOR) 80 MG tablet Take 1 tablet (80 mg total) by mouth daily at 6 PM.  . B Complex-C (B-COMPLEX WITH VITAMIN C) tablet Take 1 tablet by mouth at bedtime.  . calcium carbonate (TUMS - DOSED IN MG ELEMENTAL CALCIUM) 500 MG chewable tablet Chew 3 tablets by mouth 3 (three) times daily.  . colchicine 0.6 MG tablet Take 0.6 mg by mouth every 12 (twelve) hours as needed (gout).   . dorzolamide-timolol (COSOPT) 22.3-6.8 MG/ML ophthalmic solution Place 1 drop into both eyes 3 (three) times daily.  Marland Kitchen escitalopram (LEXAPRO) 10 MG tablet Take 10 mg by mouth daily.  Marland Kitchen gabapentin (NEURONTIN) 100 MG capsule Take 100 mg by mouth every 12 (twelve) hours as needed (neuropathy).   . latanoprost (XALATAN) 0.005 % ophthalmic solution Place 1 drop into both eyes at bedtime.   Marland Kitchen loperamide (IMODIUM) 2 MG capsule Take 4 mg by mouth as needed for diarrhea or loose stools.  Marland Kitchen LORazepam (ATIVAN) 0.5 MG  tablet Take 1 tablet (0.5 mg total) by mouth every 8 (eight) hours as needed for anxiety.  . nitroGLYCERIN (NITROSTAT) 0.4 MG SL tablet Place 1 tablet (0.4 mg total) under the tongue every 5 (five) minutes as needed for chest pain. (Patient not taking: Reported on 08/11/2014)  . omeprazole (PRILOSEC) 40 MG capsule Take 40 mg by mouth at bedtime.   . prasugrel (EFFIENT) 10 MG TABS tablet Take 1 tablet (10 mg total) by mouth daily.    . sodium bicarbonate 650 MG tablet Take 650 mg by mouth 2 (two) times daily.    . sulindac (CLINORIL) 200 MG tablet Take 200 mg by mouth 2 (two) times daily.     SIGNIFICANT DIAGNOSTIC EXAMS  08-13-14: ct of abdomen and pelvis: No acute abnormality. Granulomas in the liver. Cholelithiasis. Probable seroma in the left inguinal region.  08-16-14: left femur mri: 1. Severe LEFT hip osteoarthritis. Cannulated lag screws in the RIGHT hip. 2. Susceptibility artifact associated with metallic foreign body in the proximal LEFT femoral diaphysis degrading the evaluation of the deep soft tissues in this region. 3. No deep soft tissue abscess. 4. Small thin lateral LEFT thigh subcutaneous fluid collection measuring only about 1 cm in thickness overlies the vastus lateralis. This is nonspecific but could represent a small abscess. 5. Diffuse subcutaneous edema of the LEFT thigh, which may represent cellulitis in the appropriate clinical setting. 6. Nonspecific myositis of the hamstrings bilaterally. In the setting of prior sepsis, this is most likely infectious.   08-16-14: chest x-ray: 1. Interval mild pulmonary vascular congestion with stable cardiomegaly. 2. Stable mild chronic interstitial lung disease.  08-16-14: right upper quad ultrasound: 1. Multiple gallstones are noted within gallbladder. 2. No thickening of gallbladder wall.  Normal CBD.   08-17-14: 2-d echo: Left ventricle: The cavity size was normal. Wall thickness was increased in a pattern of mild LVH. Systolic  function was normal.  The estimated ejection fraction was in the range of 55% to 60%. Wall motion was normal; there were  no regional wall motion abnormalities. Left ventricular diastolic function parameters were normal. - Mitral valve: Calcified annulus. - Right atrium: The atrium was mildly dilated.  LABS REVIEWED:   08-13-14: wbc 16.5; hgb 10.5; hct 33.3; mcv 96.2; plt 272; glucose 79; bun 56; creat 9.04; k+4.9; na++135; ca++8.1; albumin 2.1; ast 1608; alt 322; alk phos 195; t bili 1.3; pre-albumin 13; CRP 7.2 HIV: nr; hgb a1c 5.4; HVC quant: [id number] 08-16-14; wbc 18.6; hgb 10.5; hct 32.8; mcv 96.8; plt 282; glucose 70; bun 49; creat 9.1; k+ 4.6; na++135; albumin 2.1; ast 1498; alt 150; alk phos 150; t bili 0.7; CK 9482 08-18-14: wbc 16.2; hgb 9.7; hct 30.6; mcv 98.7 plt 272; gluocse 88; bun 44; creat 9.0; k+ 4.2; na++135 ca++7.4; albumin 2.1; ast 869; alt 64 alk phos116; t bili 0.9; CK 5047 08-22-14: wbc 15.0; hgb 9.0; hct 27.7; mcv 95.5; plt 213; glucose 74; bun 54; creat 10.78; k+5.5; na++132; ca++7.2            Review of Systems  Constitutional: Negative for malaise/fatigue.  Respiratory: Negative for cough and shortness of breath.   Cardiovascular: Negative for chest pain, palpitations and leg swelling.  Gastrointestinal: Negative for abdominal pain and constipation.  Musculoskeletal: Negative for myalgias and joint pain.  Skin: Negative.   Psychiatric/Behavioral: The patient is not nervous/anxious.      Physical Exam  Constitutional: No distress.  Overweight   Neck: Neck supple. No JVD present.  Cardiovascular: Normal rate, regular rhythm and intact distal pulses.   Respiratory: Effort normal and breath sounds normal. No respiratory distress.  GI: Soft. Bowel sounds are normal. He exhibits no distension. There is no tenderness.  Musculoskeletal:  Is able to move extremities   Neurological: He is alert.  Skin: Skin is warm and dry. He is not diaphoretic.        ASSESSMENT/ PLAN:  1. ESRD on hemodialysis: is on hemodialysis four days weekly is on 1200 cc fluid restriction on calcium carb 1500 mg three times daily and sodium bicarb 650 mg twice daily  2. Left thigh abscess: is presently stable will continue to monitor his status.   3. Gout: no recent flares will continue colchicine 0.6 mg twice daily as needed and sulindac 200 mg twice daily will monitor   4.  Gerd: will continue prilosec 40 mg daily   5. Dyslipidemia: will continue lipitor 80 mg daily   6. Spinal stenosis of lumbar spine:  His pain is managed with duragesic 50 mcg patch every 3 days; neurontin 100 mg twice daily as needed and oxycodone 5 or 10 mg every 4 hours as needed for pain; has biofreeze as needed as well    7. Depression with anxiety: will continue lexapro 10 mg daily with ativan 0.5 mg every 8 hours as needed   8. CAD: status post MI: is without chest pain at this time; will continue asa 81 mg daily; prasugrel 10 mg daily and prn ntg; will monitor his status.       Ok Edwards NP [facility]  Contact [phone] Monday through Friday 8am- 5pm  After hours call [phone]

## 2014-09-26 ENCOUNTER — Ambulatory Visit (INDEPENDENT_AMBULATORY_CARE_PROVIDER_SITE_OTHER): Payer: Medicare Other | Admitting: Internal Medicine

## 2014-09-26 ENCOUNTER — Encounter: Payer: Self-pay | Admitting: Internal Medicine

## 2014-09-26 ENCOUNTER — Telehealth: Payer: Self-pay | Admitting: *Deleted

## 2014-09-26 VITALS — BP 120/69 | HR 79 | Temp 97.7°F

## 2014-09-26 DIAGNOSIS — T827XXD Infection and inflammatory reaction due to other cardiac and vascular devices, implants and grafts, subsequent encounter: Secondary | ICD-10-CM | POA: Diagnosis not present

## 2014-09-26 DIAGNOSIS — R945 Abnormal results of liver function studies: Secondary | ICD-10-CM

## 2014-09-26 DIAGNOSIS — R7989 Other specified abnormal findings of blood chemistry: Secondary | ICD-10-CM | POA: Diagnosis not present

## 2014-09-26 DIAGNOSIS — B182 Chronic viral hepatitis C: Secondary | ICD-10-CM | POA: Diagnosis not present

## 2014-09-26 LAB — COMPLETE METABOLIC PANEL WITH GFR
ALT: 22 U/L (ref 0–53)
AST: 62 U/L — ABNORMAL HIGH (ref 0–37)
Albumin: 3.3 g/dL — ABNORMAL LOW (ref 3.5–5.2)
Alkaline Phosphatase: 171 U/L — ABNORMAL HIGH (ref 39–117)
BILIRUBIN TOTAL: 1.6 mg/dL — AB (ref 0.2–1.2)
BUN: 41 mg/dL — ABNORMAL HIGH (ref 6–23)
CO2: 28 meq/L (ref 19–32)
Calcium: 8.3 mg/dL — ABNORMAL LOW (ref 8.4–10.5)
Chloride: 95 mEq/L — ABNORMAL LOW (ref 96–112)
Creat: 5.62 mg/dL — ABNORMAL HIGH (ref 0.50–1.35)
GFR, EST AFRICAN AMERICAN: 12 mL/min — AB
GFR, Est Non African American: 10 mL/min — ABNORMAL LOW
GLUCOSE: 92 mg/dL (ref 70–99)
Potassium: 4.6 mEq/L (ref 3.5–5.3)
SODIUM: 138 meq/L (ref 135–145)
Total Protein: 7.5 g/dL (ref 6.0–8.3)

## 2014-09-26 MED ORDER — VANCOMYCIN HCL IN DEXTROSE 1-5 GM/200ML-% IV SOLN: 1000.0000 mg | INTRAVENOUS | Status: DC | PRN

## 2014-09-26 NOTE — Telephone Encounter (Addendum)
Needing order clarification for Vancomycin.  RN spoke with Dr. Luciana Axe.  Continue Vancomycin for 8 weeks.  ICD-10 Code T82.7XXA.  Order read back by [facility] staff.

## 2014-09-26 NOTE — Assessment & Plan Note (Signed)
He has pus coming out of the graft site area.  He has been on ceftazidime.  I will change to vancomycin and let Dr. Briant Cedar and Dr. Darrick Penna know of pus.  May need debridement vs watchful waiting.  This is unfortunately his last AVG site option from my understanding. I will see him again in 1 month and if he is doing well, will consider continuing vancomycin for 6-8 weeks and then use suppressive oral therapy trying to keep the site viable (or weekly vancomycin?).

## 2014-09-26 NOTE — Assessment & Plan Note (Signed)
He has genotype 1a. Liver staging unknown.  Could use Zepatier and will do testing for NS5A resistance.  At this time though, will defer treatment until infection resolved.

## 2014-09-26 NOTE — Telephone Encounter (Signed)
Verbal order per Dr .Luciana Axe given to Rayla at [facility] dialysis to change to Vancomycin 1 gram on dialysis days. Order faxed to [phone]. Wendall Mola

## 2014-09-26 NOTE — Progress Notes (Signed)
   Subjective:    Patient ID: [name], male    DOB: [birth year], 59 y.o.   MRN: [id number]  HPI 59 yo male with ESRD on HD, DM, active chronic hepatitis C who presented in March with drainage of his left thigh graft site.  Discharged then on doxycycline.  Came back with septic shock. He was then needed broadly but no positive cultures.  On April 18, he did have his thigh graft evaluated surgically and noted no purulent material around it. A swab was sent for culture and again was negative. He was continued on vancomycin and Ceptaz edema however this was narrowed to ceftaz a day since he was not MRSA colonized and there are no positive cultures. The plan was to continue him on this for about 6 weeks in suppressive therapy with Augmentin for 3-6 months to see if that then could control the infection and eliminate that since this is his last graft site possible. He has had no fever or chills. He though has had sent drainage from his left graft site that is white and pus appearing. He has been getting ceftaz team with dialysis.  He also notably has chronic active hepatitis C. He is genotype 1A with an initial viral load of 1.5 million.  He does not have any obvious signs of cirrhosis though has not had elastography. He does have a remote history of drug use.   Review of Systems  Constitutional: Negative for fever and chills.  Gastrointestinal: Negative for nausea and diarrhea.  Skin: Positive for wound. Negative for rash.       With drainage  Neurological: Negative for dizziness and light-headedness.       Objective:   Physical Exam  Constitutional: He appears well-developed and well-nourished. No distress.  Eyes: No scleral icterus.  Cardiovascular: Normal rate, regular rhythm and normal heart sounds.   No murmur heard. Pulmonary/Chest: Effort normal and breath sounds normal. No respiratory distress.  Musculoskeletal:  Left femoral graft site with some induration, no warmth, no erythema,  small opening and able to express pus out of the area  Lymphadenopathy:    He has no cervical adenopathy.  Skin: No rash noted.          Assessment & Plan:

## 2014-09-26 NOTE — Assessment & Plan Note (Signed)
Very elevated during sepsis episode.  Will recheck today.

## 2014-09-27 ENCOUNTER — Inpatient Hospital Stay: Payer: Medicare Other | Admitting: Internal Medicine

## 2014-09-27 LAB — CBC WITH DIFFERENTIAL/PLATELET
BASOS ABS: 0 10*3/uL (ref 0.0–0.1)
Basophils Relative: 0 % (ref 0–1)
Eosinophils Absolute: 0.4 10*3/uL (ref 0.0–0.7)
Eosinophils Relative: 4 % (ref 0–5)
HCT: 29 % — ABNORMAL LOW (ref 39.0–52.0)
Hemoglobin: 9.1 g/dL — ABNORMAL LOW (ref 13.0–17.0)
Lymphocytes Relative: 13 % (ref 12–46)
Lymphs Abs: 1.5 10*3/uL (ref 0.7–4.0)
MCH: 29.6 pg (ref 26.0–34.0)
MCHC: 31.4 g/dL (ref 30.0–36.0)
MCV: 94.5 fL (ref 78.0–100.0)
MONOS PCT: 7 % (ref 3–12)
MPV: 10.8 fL (ref 8.6–12.4)
Monocytes Absolute: 0.8 10*3/uL (ref 0.1–1.0)
Neutro Abs: 8.5 10*3/uL — ABNORMAL HIGH (ref 1.7–7.7)
Neutrophils Relative %: 76 % (ref 43–77)
PLATELETS: 206 10*3/uL (ref 150–400)
RBC: 3.07 MIL/uL — ABNORMAL LOW (ref 4.22–5.81)
RDW: 15.8 % — ABNORMAL HIGH (ref 11.5–15.5)
WBC: 11.2 10*3/uL — ABNORMAL HIGH (ref 4.0–10.5)

## 2014-09-27 LAB — HIV ANTIBODY (ROUTINE TESTING W REFLEX): HIV: NONREACTIVE

## 2014-09-27 LAB — PROTIME-INR
INR: 1.1 (ref <1.50)
PROTHROMBIN TIME: 14.2 s (ref 11.6–15.2)

## 2014-10-01 ENCOUNTER — Non-Acute Institutional Stay (SKILLED_NURSING_FACILITY): Payer: Medicare Other | Admitting: Internal Medicine

## 2014-10-01 ENCOUNTER — Telehealth: Payer: Self-pay | Admitting: *Deleted

## 2014-10-01 ENCOUNTER — Encounter: Payer: Self-pay | Admitting: Internal Medicine

## 2014-10-01 DIAGNOSIS — R740 Nonspecific elevation of levels of transaminase and lactic acid dehydrogenase [LDH]: Secondary | ICD-10-CM

## 2014-10-01 DIAGNOSIS — I10 Essential (primary) hypertension: Secondary | ICD-10-CM

## 2014-10-01 DIAGNOSIS — I25119 Atherosclerotic heart disease of native coronary artery with unspecified angina pectoris: Secondary | ICD-10-CM

## 2014-10-01 DIAGNOSIS — M48061 Spinal stenosis, lumbar region without neurogenic claudication: Secondary | ICD-10-CM

## 2014-10-01 DIAGNOSIS — T827XXA Infection and inflammatory reaction due to other cardiac and vascular devices, implants and grafts, initial encounter: Secondary | ICD-10-CM | POA: Diagnosis not present

## 2014-10-01 DIAGNOSIS — N186 End stage renal disease: Secondary | ICD-10-CM

## 2014-10-01 DIAGNOSIS — B182 Chronic viral hepatitis C: Secondary | ICD-10-CM

## 2014-10-01 DIAGNOSIS — M4806 Spinal stenosis, lumbar region: Secondary | ICD-10-CM

## 2014-10-01 DIAGNOSIS — I509 Heart failure, unspecified: Secondary | ICD-10-CM

## 2014-10-01 DIAGNOSIS — Z992 Dependence on renal dialysis: Secondary | ICD-10-CM | POA: Diagnosis not present

## 2014-10-01 DIAGNOSIS — R748 Abnormal levels of other serum enzymes: Secondary | ICD-10-CM

## 2014-10-01 DIAGNOSIS — M79652 Pain in left thigh: Secondary | ICD-10-CM | POA: Diagnosis not present

## 2014-10-01 NOTE — Telephone Encounter (Signed)
Received call from Rayla Delapp, RN at [facility] with a notice of noncompliance. Patient is refusing vancomycin during dialysis x 2 doses (Saturday 6/4 and Monday 6/6).  Patient is in a nursing home, receives scheduled pain medication (oxycodone) q 4 hours there.  He refuses to stay on the dialysis machine long enough for the medication to infuse (45 - 60 additional minutes).  Per Rayla, the patient has been educated about the risk of not taking his antibiotic, but he still refuses, stating he is ready to go back to the nursing facility.  Per Rayla, the patient is too weak to get out of his wheelchair without assistance, sleeps throughout the dialysis treatment.  When he wakes up, he demands to be unhooked and to go home.  At the time of the call, the patient is in his wheelchair at the front of the dialysis center, smoking a cigarette and waiting for transportation back to the facility. Andree Coss, RN

## 2014-10-01 NOTE — Progress Notes (Signed)
Patient ID: [name], male   DOB: 09/26/1955, 59 y.o.   MRN: [id number]    DATE:10/01/14  Location:  [place] of Service: [id number] [phone])   Extended Emergency Contact Information Primary Emergency Contact: Loppnow,[name] Address: [address]          [place], [place] [address] Johnnette Litter of [place] Phone: [phone] Relation: Mother Secondary Emergency Contact: Wickard,[name]  [place] Phone: [phone] Relation: Sister  Advanced Directive information  FULL CODE  Chief Complaint  Patient presents with  . Medical Management of Chronic Issues    HPI:  59 yo male long term resident seen today for f/u. He c/o significant LLE pain. 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 He was seen by ID who placed him on vanco with HD. The leg swelling and pain is worse during and after HD. He only had 2 hrs today and plans to go for 1.5 hrs tomorrow. swelling improves over night but does not completely resolve. He has missed 2 dose of vanco as he stopped tx before med administered due to "severe" leg pain. Per ID, he will need 8 weeks of vanco for AVG infection. He "squeezes" pus out wound to prevent it from worsening.   He has OD cats and will be scheduled for cats sx per eye specialist note in chart.  DM - diet controlled  HTN - BP controlled with diet. He takes prasugrel for CHF. He also takes ASA daily and lipitor forcholesterol  GERD/hepatitis/elevated liver enzymes - stable on omeprazole  Spinal stenosis with neuropathy - takes gabapentin for neuropathy and prn roxicodone and sulindac. fentaynl patch applied q72hrs and also uses prn biofreeze  Anxiety - stable on lexapro, prn lorazepam  He takes vitamins/minerals daily as well as nutritional supplements  Past Medical History  Diagnosis Date  . Anxiety   . Back pain   . GERD (gastroesophageal reflux disease)   . Peripheral neuropathy   .  Arthritis   . Chronic diastolic CHF (congestive heart failure)     a. 04/2013 Echo: EF nl, no rwma, mild LVH.  Marland Kitchen Active smoker   . ESRD on hemodialysis     [name] 4 days per week on M-Tu-Wed and Fri.  Started HD in 1998 and has been on HD initially at [facility], then went to [facility], then in [facility], then to [facility] and now is at [facility] for last 10 years.  Has L thigh AVG.     . Diabetes mellitus     diet controlled  . 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 PONV (postoperative nausea and vomiting)   . Hypertension   . Bell palsy   . Carpal tunnel syndrome     bilateral  . Palpitations   . Headache(784.0)     Hx: of Migraines  . CAD (coronary artery disease)     a. 04/2013 Ant STEMI/PCI: LM nl, LAD 95p (3.0x18 Xience DES), D1/2/3 small, LCX 80ost, RI 20p, RCA 169m CTO (unsuccessful PCI), EF 55%, mod basal inf HK.  . S/P coronary artery stent placement 05/21/2013    Xience Alpine Medtronic conditional 5 @ 1.5 and 3T     Past Surgical History  Procedure Laterality Date  . Total hip arthroplasty    . Thyroidectomy    . Tooth extraction    .  Mandible fracture surgery    . Dg av dialysis graft declot or    . Insertion of dialysis catheter  03/28/2011    Procedure: INSERTION OF DIALYSIS CATHETER;  Surgeon: Mal Misty, MD;  Location: [place];  Service: Vascular;  Laterality: Left;  . Av fistula placement  03/31/2011    Procedure: INSERTION OF ARTERIOVENOUS (AV) GORE-TEX GRAFT THIGH;  Surgeon: Elam Dutch, MD;  Location: [facility] OR;  Service: Vascular;  Laterality: Right;  redo right thigh arteriovenous gortex graft using gore-tex stretch 20mm x 70cm  . Thrombectomy w/ embolectomy  06/02/2011    Procedure: THROMBECTOMY ARTERIOVENOUS GORE-TEX GRAFT;  Surgeon: Angelia Mould, MD;  Location: [place];  Service: Vascular;  Laterality: Right;  Thrombectomy right thigh arteriovenous gortex graft;  revision by  replacement of large portion of graft with 51mm gore-tex     . Insertion of dialysis catheter  08/28/2011    Procedure: INSERTION OF DIALYSIS CATHETER;  Surgeon: Conrad High Bridge, MD;  Location: [place];  Service: Vascular;  Laterality: Left;  Atempted Bilateral Internal Jugular, Bilateral Subclavin insertion of 55cm Dialysis Catheter Left Femoral  . Insertion of dialysis catheter  12/15/2011    Procedure: INSERTION OF DIALYSIS CATHETER;  Surgeon: Elam Dutch, MD;  Location: [place];  Service: Vascular;  Laterality: Right;  Insertion of Right Femoral Dialysis Catheter  . Exchange of a dialysis catheter  02/26/2012    Procedure: EXCHANGE OF A DIALYSIS CATHETER;  Surgeon: Rosetta Posner, MD;  Location: [place];  Service: Vascular;  Laterality: Right;  . Joint replacement    . Exchange of a dialysis catheter  05/18/2012    Procedure: EXCHANGE OF A DIALYSIS CATHETER;  Surgeon: Rosetta Posner, MD;  Location: [place];  Service: Vascular;  Laterality: Right;  right femoral dialysis catheter  . Av fistula placement  05/18/2012    Procedure: INSERTION OF ARTERIOVENOUS (AV) GORE-TEX GRAFT THIGH;  Surgeon: Rosetta Posner, MD;  Location: [facility] OR;  Service: Vascular;  Laterality: Left;  using 24mm by 70cm goretex graft  . Thrombectomy w/ embolectomy Left 08/25/2012    Procedure: THROMBECTOMY ARTERIOVENOUS GORE-TEX GRAFT;  Surgeon: Mal Misty, MD;  Location: [place];  Service: Vascular;  Laterality: Left;  Attempted thrombectomy of left thigh arteriovenous gortex graft.   . Av fistula placement Left 08/25/2012    Procedure: INSERTION OF ARTERIOVENOUS (AV) GORE-TEX GRAFT THIGH;  Surgeon: Mal Misty, MD;  Location: [facility] OR;  Service: Vascular;  Laterality: Left;  Using 61mm x 40cm vascular Gortex graft.  . Revision of arteriovenous goretex graft Left 12/14/2012    Procedure: Revision of Left Thigh Graft;  Surgeon: Rosetta Posner, MD;  Location: [place];  Service: Vascular;  Laterality: Left;  . Avgg removal Left 02/16/2013    Procedure: EXCISION OF LEFT ARM ARTERIOVENOUS GORETEX GRAFT TIMES 2 WITH  VEIN PATCH ANGIOPLASTY OF BRACIAL ARTERY.  ;  Surgeon: Angelia Mould, MD;  Location: [place];  Service: Vascular;  Laterality: Left;  Converted from [place] to General.    . Revision of arteriovenous goretex graft Left 08/08/2013    Procedure: REVISION OF LEFT THIGH ARTERIOVENOUS GORETEX GRAFT;  Surgeon: Elam Dutch, MD;  Location: [place];  Service: Vascular;  Laterality: Left;  . Venogram Bilateral 10/27/2011    Procedure: VENOGRAM;  Surgeon: Serafina Mitchell, MD;  Location: [facility] CATH LAB;  Service: Cardiovascular;  Laterality: Bilateral;  bilat upper extrem venograms  . Left heart cath Bilateral 05/21/2013  Procedure: LEFT HEART CATH;  Surgeon: Wellington Hampshire, MD;  Location: [facility] CATH LAB;  Service: Cardiovascular;  Laterality: Bilateral;  . Percutaneous coronary stent intervention (pci-s)  05/21/2013    Procedure: PERCUTANEOUS CORONARY STENT INTERVENTION (PCI-S);  Surgeon: Wellington Hampshire, MD;  Location: [facility] CATH LAB;  Service: Cardiovascular;;  . Coronary angioplasty with stent placement    . Radiology with anesthesia N/A 06/27/2014    Procedure: MRI LUMBER WITHOUT CONTRAST;  Surgeon: Medication Radiologist, MD;  Location: [place];  Service: Radiology;  Laterality: N/A;  . Revision of arteriovenous goretex graft Left 08/13/2014    Procedure: REVISION OF ARTERIOVENOUS GORETEX GRAFT LEFT THIGH;  Surgeon: Elam Dutch, MD;  Location: [place];  Service: Vascular;  Laterality: Left;  . Radiology with anesthesia N/A 08/16/2014    Procedure: MRI;  Surgeon: Medication Radiologist, MD;  Location: [place];  Service: Radiology;  Laterality: N/A;  . Thrombectomy w/ embolectomy Left 08/22/2014    Procedure: THROMBECTOMY ARTERIOVENOUS GORE-TEX GRAFT Of Left Thigh Graft;  Surgeon: Rosetta Posner, MD;  Location: [place];  Service: Vascular;  Laterality: Left;    Patient Care Team: Fleet Contras, MD as PCP - General (Nephrology) Fleet Contras, MD as Consulting Physician (Nephrology) Earnie Larsson, MD  (Neurosurgery)  History   Social History  . Marital Status: Single    Spouse Name: N/A  . Number of Children: N/A  . Years of Education: N/A   Occupational History  . Not on file.   Social History Main Topics  . Smoking status: Current Every Day Smoker -- 0.20 packs/day for 40 years    Types: Cigarettes  . Smokeless tobacco: Never Used  . Alcohol Use: No  . Drug Use: No  . Sexual Activity: No   Other Topics Concern  . Not on file   Social History Narrative     reports that he has been smoking Cigarettes.  He has a 8 pack-year smoking history. He has never used smokeless tobacco. He reports that he does not drink alcohol or use illicit drugs.   There is no immunization history on file for this patient.  No Known Allergies  Medications: Patient's Medications  New Prescriptions   No medications on file  Previous Medications   AMINO ACIDS-PROTEIN HYDROLYS (FEEDING SUPPLEMENT, PRO-STAT SUGAR FREE 64,) LIQD    Take 30 mLs by mouth 2 (two) times daily.   ASPIRIN 81 MG TABLET    Take 1 tablet (81 mg total) by mouth daily.   ATORVASTATIN (LIPITOR) 80 MG TABLET    Take 1 tablet (80 mg total) by mouth daily at 6 PM.   B COMPLEX-C (B-COMPLEX WITH VITAMIN C) TABLET    Take 1 tablet by mouth at bedtime.   CALCIUM CARBONATE (TUMS - DOSED IN MG ELEMENTAL CALCIUM) 500 MG CHEWABLE TABLET    Chew 3 tablets by mouth 3 (three) times daily.   COLCHICINE 0.6 MG TABLET    Take 0.6 mg by mouth every 12 (twelve) hours as needed (gout).    DORZOLAMIDE-TIMOLOL (COSOPT) 22.3-6.8 MG/ML OPHTHALMIC SOLUTION    Place 1 drop into both eyes 3 (three) times daily.   ESCITALOPRAM (LEXAPRO) 10 MG TABLET    Take 10 mg by mouth daily.   FENTANYL (DURAGESIC - DOSED MCG/HR) 50 MCG/HR    Apply one patch topically every 72 hours for pain. Remove old patch   GABAPENTIN (NEURONTIN) 100 MG CAPSULE    Take 100 mg by mouth every 12 (twelve) hours as needed (neuropathy).  LATANOPROST (XALATAN) 0.005 % OPHTHALMIC  SOLUTION    Place 1 drop into both eyes at bedtime.    LOPERAMIDE (IMODIUM) 2 MG CAPSULE    Take 4 mg by mouth as needed for diarrhea or loose stools.   LORAZEPAM (ATIVAN) 0.5 MG TABLET    Take 1 tablet (0.5 mg total) by mouth every 8 (eight) hours as needed for anxiety.   MENTHOL, TOPICAL ANALGESIC, 4 % GEL    Apply 1 application topically every 6 (six) hours as needed (pain). Apply to shoulders/lower back   NITROGLYCERIN (NITROSTAT) 0.4 MG SL TABLET    Place 1 tablet (0.4 mg total) under the tongue every 5 (five) minutes as needed for chest pain.   OMEPRAZOLE (PRILOSEC) 40 MG CAPSULE    Take 40 mg by mouth at bedtime.    OXYCODONE (OXY IR/ROXICODONE) 5 MG IMMEDIATE RELEASE TABLET    Take 1-2 tablets (5-10 mg total) by mouth every 4 (four) hours as needed for moderate pain.   PRASUGREL (EFFIENT) 10 MG TABS TABLET    Take 1 tablet (10 mg total) by mouth daily.   SODIUM BICARBONATE 650 MG TABLET    Take 650 mg by mouth 2 (two) times daily.     SULINDAC (CLINORIL) 200 MG TABLET    Take 200 mg by mouth 2 (two) times daily.   VANCOMYCIN (VANCOCIN) 1 GM/200ML SOLN    Inject 200 mLs (1,000 mg total) into the vein every dialysis.  Modified Medications   No medications on file  Discontinued Medications   No medications on file    Review of Systems  Constitutional: Negative for chills, activity change and fatigue.  HENT: Negative for sore throat and trouble swallowing.   Eyes: Negative for visual disturbance.  Respiratory: Negative for cough, chest tightness and shortness of breath.   Cardiovascular: Positive for leg swelling. Negative for chest pain and palpitations.  Gastrointestinal: Negative for nausea, vomiting, abdominal pain and blood in stool.  Genitourinary: Negative for urgency, frequency and difficulty urinating.  Musculoskeletal: Positive for back pain and arthralgias. Negative for gait problem.  Skin: Positive for wound. Negative for rash.  Neurological: Negative for weakness and  headaches.  Psychiatric/Behavioral: Negative for confusion and sleep disturbance. The patient is not nervous/anxious.     Filed Vitals:   10/01/14 1748  BP: 121/71  Pulse: 61  Temp: 97.8 F (36.6 C)  Weight: 255 lb (115.667 kg)  SpO2: 96%   Body mass index is 35.58 kg/(m^2).  Physical Exam  Constitutional: He appears well-developed.  Frail appearing in NAD  HENT:  Mouth/Throat: Oropharynx is clear and moist.  Eyes: Pupils are equal, round, and reactive to light. No scleral icterus.  Neck: Neck supple. Carotid bruit is present (b/l). No thyromegaly present.  Cardiovascular: Normal rate, regular rhythm and intact distal pulses.  Exam reveals no gallop and no friction rub.   Murmur heard.  Systolic murmur is present with a grade of 1/6  L>RLE +1 pitting edema. (+) palpable thrill/audible bruit in left thigh AVG  Pulmonary/Chest: Effort normal and breath sounds normal. He has no wheezes. He has no rales. He exhibits no tenderness.  Abdominal: Soft. Bowel sounds are normal. He exhibits no distension, no abdominal bruit, no pulsatile midline mass and no mass. There is no tenderness. There is no rebound and no guarding.  Musculoskeletal: He exhibits edema and tenderness.  Lymphadenopathy:    He has no cervical adenopathy.  Neurological: He is alert.  Skin: Skin is warm and dry.  Left anterior thigh wound noted but no d/c at this time;  (+) redness. LLE tight with mottling of skin  Psychiatric: He has a normal mood and affect. His behavior is normal. Thought content normal.     Labs reviewed: Office Visit on 09/26/2014  Component Date Value Ref Range Status  . HIV 1&2 Ab, 4th Generation 09/26/2014 NONREACTIVE  NONREACTIVE Final   Comment:   HIV-1 antigen and HIV-1/HIV-2 antibodies were not detected.  There is no laboratory evidence of HIV infection.   HIV-1/2 Antibody Diff        Not indicated. HIV-1 RNA, Qual TMA          Not indicated.     PLEASE NOTE: This information has  been disclosed to you from records whose confidentiality may be protected by state law. If your state requires such protection, then the state law prohibits you from making any further disclosure of the information without the specific written consent of the person to whom it pertains, or as otherwise permitted by law. A general authorization for the release of medical or other information is NOT sufficient for this purpose.   The performance of this assay has not been clinically validated in patients less than 81 years old.   For additional information please refer to http://education.questdiagnostics.com/faq/FAQ106.  (This link is being provided for informational/educational purposes only.)     . Prothrombin Time 09/26/2014 14.2  11.6 - 15.2 seconds Final  . INR 09/26/2014 1.10  <1.50 Final   Comment: The INR is of principal utility in following patients on stable doses of oral anticoagulants.  The therapeutic range is generally 2.0 to 3.0, but may be 3.0 to 4.0 in patients with mechanical cardiac valves, recurrent embolisms and antiphospholipid antibodies (including lupus inhibitors).   . Sodium 09/26/2014 138  135 - 145 mEq/L Final  . Potassium 09/26/2014 4.6  3.5 - 5.3 mEq/L Final  . Chloride 09/26/2014 95* 96 - 112 mEq/L Final  . CO2 09/26/2014 28  19 - 32 mEq/L Final  . Glucose, Bld 09/26/2014 92  70 - 99 mg/dL Final  . BUN 09/26/2014 41* 6 - 23 mg/dL Final  . Creat 09/26/2014 5.62* 0.50 - 1.35 mg/dL Final  . Total Bilirubin 09/26/2014 1.6* 0.2 - 1.2 mg/dL Final  . Alkaline Phosphatase 09/26/2014 171* 39 - 117 U/L Final  . AST 09/26/2014 62* 0 - 37 U/L Final  . ALT 09/26/2014 22  0 - 53 U/L Final  . Total Protein 09/26/2014 7.5  6.0 - 8.3 g/dL Final  . Albumin 09/26/2014 3.3* 3.5 - 5.2 g/dL Final  . Calcium 09/26/2014 8.3* 8.4 - 10.5 mg/dL Final  . GFR, Est African American 09/26/2014 12*  Final  . GFR, Est Non African American 09/26/2014 10*  Final   Comment:   The  estimated GFR is a calculation valid for adults (>=9 years old) that uses the CKD-EPI algorithm to adjust for age and sex. It is   not to be used for children, pregnant women, hospitalized patients,    patients on dialysis, or with rapidly changing kidney function. According to the NKDEP, eGFR >89 is normal, 60-89 shows mild impairment, 30-59 shows moderate impairment, 15-29 shows severe impairment and <15 is ESRD.     . WBC 09/26/2014 11.2* 4.0 - 10.5 K/uL Final  . RBC 09/26/2014 3.07* 4.22 - 5.81 MIL/uL Final  . Hemoglobin 09/26/2014 9.1* 13.0 - 17.0 g/dL Final  . HCT 09/26/2014 29.0* 39.0 - 52.0 % Final  . MCV  09/26/2014 94.5  78.0 - 100.0 fL Final  . MCH 09/26/2014 29.6  26.0 - 34.0 pg Final  . MCHC 09/26/2014 31.4  30.0 - 36.0 g/dL Final  . RDW 09/26/2014 15.8* 11.5 - 15.5 % Final  . Platelets 09/26/2014 206  150 - 400 K/uL Final  . MPV 09/26/2014 10.8  8.6 - 12.4 fL Final  . Neutrophils Relative % 09/26/2014 76  43 - 77 % Final  . Neutro Abs 09/26/2014 8.5* 1.7 - 7.7 K/uL Final  . Lymphocytes Relative 09/26/2014 13  12 - 46 % Final  . Lymphs Abs 09/26/2014 1.5  0.7 - 4.0 K/uL Final  . Monocytes Relative 09/26/2014 7  3 - 12 % Final  . Monocytes Absolute 09/26/2014 0.8  0.1 - 1.0 K/uL Final  . Eosinophils Relative 09/26/2014 4  0 - 5 % Final  . Eosinophils Absolute 09/26/2014 0.4  0.0 - 0.7 K/uL Final  . Basophils Relative 09/26/2014 0  0 - 1 % Final  . Basophils Absolute 09/26/2014 0.0  0.0 - 0.1 K/uL Final  . Smear Review 09/26/2014 Criteria for review not met   Final  Admission on 08/10/2014, Discharged on 08/23/2014  No results displayed because visit has over 200 results.      No results found.   Assessment/Plan   ICD-9-CM ICD-10-CM   1. Infection and inflammatory reaction due to cardiac device, implant, and graft, initial encounter 996.61 T82.7XXA   2. Pain of left thigh - unchanged 729.5 M79.652   3. Essential hypertension - stable 401.9 I10   4. End-stage  renal disease on hemodialysis on MTWF- stable 585.6 N18.6    V45.11 Z99.2   5. Chronic heart failure, unspecified heart failure type - stable 428.9 I50.9   6. Coronary artery disease involving native coronary artery of native heart with angina pectoris - stable 414.01 I25.119    413.9    7. Spinal stenosis of lumbar region - stable 724.02 M48.06   8. Abnormal transaminases - stable 790.4 R74.0   9. Chronic hepatitis C without hepatic coma - stable 070.54 B18.2     --continue current meds as ordered  --f/u with nephrology as scheduled  --may need vascular sx input  --recommend cont with vanco as Rx per ID  --f/u with HD as scheduled  --fluid restrict 1200 cc per day  --cont PT/OT as ordered  --will follow  Fontaine Hehl S. Perlie Gold  [facility] [address] [place], [place] [address] [phone] Cell (Monday-Friday 8 AM - 5 PM) [phone] After 5 PM and follow prompts

## 2014-10-03 ENCOUNTER — Encounter: Payer: Self-pay | Admitting: Vascular Surgery

## 2014-10-03 ENCOUNTER — Telehealth: Payer: Self-pay

## 2014-10-03 NOTE — Telephone Encounter (Signed)
Phone call from [facility].  Reported that pt. is not tolerating full HD treatments, due to c/o increased pain and swelling of left thigh during treatment.  Has been recommended by ID to receive Vancomycin IV x 8 weeks for infected left thigh AVG.  Stated he has missed Vancomycin doses x 2 recently, due to not being able to finish the tx, to receive the antibiotic.  Reported the pt. doesn't have a dialysis catheter.  Discussed with Dr. Darrick Penna.  Recommended to schedule office appt, and stated that the Renal Service needs to determine what direction to proceed with this pt.  Notified Gerome Apley, [place].  Will contact pt. for office evaluation.

## 2014-10-03 NOTE — Telephone Encounter (Signed)
I spoke with [name] to schedule, dpm

## 2014-10-04 ENCOUNTER — Ambulatory Visit (INDEPENDENT_AMBULATORY_CARE_PROVIDER_SITE_OTHER): Payer: Self-pay | Admitting: Vascular Surgery

## 2014-10-04 ENCOUNTER — Other Ambulatory Visit: Payer: Self-pay

## 2014-10-04 VITALS — BP 147/70 | HR 72 | Temp 97.4°F | Resp 16 | Ht 69.0 in | Wt 251.3 lb

## 2014-10-04 DIAGNOSIS — I25119 Atherosclerotic heart disease of native coronary artery with unspecified angina pectoris: Secondary | ICD-10-CM

## 2014-10-04 DIAGNOSIS — I87302 Chronic venous hypertension (idiopathic) without complications of left lower extremity: Secondary | ICD-10-CM

## 2014-10-04 NOTE — Progress Notes (Signed)
[facility] [place] HISTORY AND PHYSICAL   History of Present Illness:  Patient is a 59 y.o. year old male who presents for evaluation of left leg swelling. The patient has a lengthy history of hemodialysis. He has had multiple grafts in the past. The left thigh graft that he is currently using is his only access site. He had a simple thrombectomy of this in April by Dr. Arbie Cookey.  Since that time, the patient has complained that he has had swelling in the left leg. He states he is unable to walk because the leg is so swollen. He did have one episode of bleeding from the graft recently which was repaired with a suture in the ER. They are also having difficulty with dialysis due to flow in the graft. He denies fever or chills or drainage from the graft or any of the recent incisions. Other medical problems include chronic back pain, reflux, neuropathy, diastolic heart failure, diabetes, hepatitis, hypertension, coronary artery disease all of which are currently stable.  Past Medical History  Diagnosis Date  . Anxiety   . Back pain   . GERD (gastroesophageal reflux disease)   . Peripheral neuropathy   . Arthritis   . Chronic diastolic CHF (congestive heart failure)     a. 04/2013 Echo: EF nl, no rwma, mild LVH.  Marland Kitchen Active smoker   . ESRD on hemodialysis     [name] 4 days per week on M-Tu-Wed and Fri.  Started HD in 1998 and has been on HD initially at [facility], then went to [facility], then in [facility], then to [facility] and now is at [facility] for last 10 years.  Has L thigh AVG.     . Diabetes mellitus     diet controlled  . 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 PONV (postoperative nausea and vomiting)   . Hypertension   . Bell palsy   . Carpal tunnel syndrome     bilateral  . Palpitations   . Headache(784.0)     Hx: of Migraines  . CAD (coronary artery disease)     a. 04/2013 Ant STEMI/PCI: LM nl, LAD 95p (3.0x18 Xience DES), D1/2/3 small, LCX  80ost, RI 20p, RCA 163m CTO (unsuccessful PCI), EF 55%, mod basal inf HK.  . S/P coronary artery stent placement 05/21/2013    Xience Alpine Medtronic conditional 5 @ 1.5 and 3T     Past Surgical History  Procedure Laterality Date  . Total hip arthroplasty    . Thyroidectomy    . Tooth extraction    . Mandible fracture surgery    . Dg av dialysis graft declot or    . Insertion of dialysis catheter  03/28/2011    Procedure: INSERTION OF DIALYSIS CATHETER;  Surgeon: Pryor Ochoa, MD;  Location: [facility] OR;  Service: Vascular;  Laterality: Left;  . Av fistula placement  03/31/2011    Procedure: INSERTION OF ARTERIOVENOUS (AV) GORE-TEX GRAFT THIGH;  Surgeon: Sherren Kerns, MD;  Location: [facility] OR;  Service: Vascular;  Laterality: Right;  redo right thigh arteriovenous gortex graft using gore-tex stretch 6mm x 70cm  . Thrombectomy w/ embolectomy  06/02/2011    Procedure: THROMBECTOMY ARTERIOVENOUS GORE-TEX GRAFT;  Surgeon: Chuck Hint, MD;  Location: [facility] OR;  Service: Vascular;  Laterality: Right;  Thrombectomy right thigh arteriovenous gortex graft;  revision by  replacement of large portion of graft with 7mm  gore-tex   . Insertion of dialysis catheter  08/28/2011    Procedure: INSERTION OF DIALYSIS CATHETER;  Surgeon: Fransisco Hertz, MD;  Location: [facility] OR;  Service: Vascular;  Laterality: Left;  Atempted Bilateral Internal Jugular, Bilateral Subclavin insertion of 55cm Dialysis Catheter Left Femoral  . Insertion of dialysis catheter  12/15/2011    Procedure: INSERTION OF DIALYSIS CATHETER;  Surgeon: Sherren Kerns, MD;  Location: [facility] OR;  Service: Vascular;  Laterality: Right;  Insertion of Right Femoral Dialysis Catheter  . Exchange of a dialysis catheter  02/26/2012    Procedure: EXCHANGE OF A DIALYSIS CATHETER;  Surgeon: Larina Earthly, MD;  Location: [facility] OR;  Service: Vascular;  Laterality: Right;  . Joint replacement    . Exchange of a dialysis catheter  05/18/2012    Procedure: EXCHANGE OF A  DIALYSIS CATHETER;  Surgeon: Larina Earthly, MD;  Location: [facility] OR;  Service: Vascular;  Laterality: Right;  right femoral dialysis catheter  . Av fistula placement  05/18/2012    Procedure: INSERTION OF ARTERIOVENOUS (AV) GORE-TEX GRAFT THIGH;  Surgeon: Larina Earthly, MD;  Location: [facility] OR;  Service: Vascular;  Laterality: Left;  using 82mm by 70cm goretex graft  . Thrombectomy w/ embolectomy Left 08/25/2012    Procedure: THROMBECTOMY ARTERIOVENOUS GORE-TEX GRAFT;  Surgeon: Pryor Ochoa, MD;  Location: [facility] OR;  Service: Vascular;  Laterality: Left;  Attempted thrombectomy of left thigh arteriovenous gortex graft.   . Av fistula placement Left 08/25/2012    Procedure: INSERTION OF ARTERIOVENOUS (AV) GORE-TEX GRAFT THIGH;  Surgeon: Pryor Ochoa, MD;  Location: [facility] OR;  Service: Vascular;  Laterality: Left;  Using 41mm x 40cm vascular Gortex graft.  . Revision of arteriovenous goretex graft Left 12/14/2012    Procedure: Revision of Left Thigh Graft;  Surgeon: Larina Earthly, MD;  Location: [facility] OR;  Service: Vascular;  Laterality: Left;  . Avgg removal Left 02/16/2013    Procedure: EXCISION OF LEFT ARM ARTERIOVENOUS GORETEX GRAFT TIMES 2 WITH VEIN PATCH ANGIOPLASTY OF BRACIAL ARTERY.  ;  Surgeon: Chuck Hint, MD;  Location: [facility] OR;  Service: Vascular;  Laterality: Left;  Converted from MAC to General.    . Revision of arteriovenous goretex graft Left 08/08/2013    Procedure: REVISION OF LEFT THIGH ARTERIOVENOUS GORETEX GRAFT;  Surgeon: Sherren Kerns, MD;  Location: [facility] OR;  Service: Vascular;  Laterality: Left;  . Venogram Bilateral 10/27/2011    Procedure: VENOGRAM;  Surgeon: Nada Libman, MD;  Location: [facility] CATH LAB;  Service: Cardiovascular;  Laterality: Bilateral;  bilat upper extrem venograms  . Left heart cath Bilateral 05/21/2013    Procedure: LEFT HEART CATH;  Surgeon: Iran Ouch, MD;  Location: [facility] CATH LAB;  Service: Cardiovascular;  Laterality: Bilateral;  . Percutaneous coronary stent  intervention (pci-s)  05/21/2013    Procedure: PERCUTANEOUS CORONARY STENT INTERVENTION (PCI-S);  Surgeon: Iran Ouch, MD;  Location: [facility] CATH LAB;  Service: Cardiovascular;;  . Coronary angioplasty with stent placement    . Radiology with anesthesia N/A 06/27/2014    Procedure: MRI LUMBER WITHOUT CONTRAST;  Surgeon: Medication Radiologist, MD;  Location: [facility] OR;  Service: Radiology;  Laterality: N/A;  . Revision of arteriovenous goretex graft Left 08/13/2014    Procedure: REVISION OF ARTERIOVENOUS GORETEX GRAFT LEFT THIGH;  Surgeon: Sherren Kerns, MD;  Location: [facility] OR;  Service: Vascular;  Laterality: Left;  . Radiology with anesthesia N/A 08/16/2014    Procedure: MRI;  Surgeon: Medication Radiologist,  MD;  Location: [facility] OR;  Service: Radiology;  Laterality: N/A;  . Thrombectomy w/ embolectomy Left 08/22/2014    Procedure: THROMBECTOMY ARTERIOVENOUS GORE-TEX GRAFT Of Left Thigh Graft;  Surgeon: Larina Earthly, MD;  Location: [facility] OR;  Service: Vascular;  Laterality: Left;    Social History History  Substance Use Topics  . Smoking status: Current Every Day Smoker -- 0.20 packs/day for 40 years    Types: Cigarettes  . Smokeless tobacco: Never Used  . Alcohol Use: No    Family History Family History  Problem Relation Age of Onset  . Diabetes Mother   . Stroke Mother   . Heart disease Mother   . Kidney disease Father   . Hyperlipidemia Father     Allergies  No Known Allergies   Current Outpatient Prescriptions  Medication Sig Dispense Refill  . Amino Acids-Protein Hydrolys (FEEDING SUPPLEMENT, PRO-STAT SUGAR FREE 64,) LIQD Take 30 mLs by mouth 2 (two) times daily.    Marland Kitchen aspirin 81 MG tablet Take 1 tablet (81 mg total) by mouth daily.    Marland Kitchen atorvastatin (LIPITOR) 80 MG tablet Take 1 tablet (80 mg total) by mouth daily at 6 PM. 30 tablet 11  . B Complex-C (B-COMPLEX WITH VITAMIN C) tablet Take 1 tablet by mouth at bedtime.    . calcium carbonate (TUMS - DOSED IN MG ELEMENTAL CALCIUM)  500 MG chewable tablet Chew 3 tablets by mouth 3 (three) times daily.    . colchicine 0.6 MG tablet Take 0.6 mg by mouth every 12 (twelve) hours as needed (gout).     . dorzolamide-timolol (COSOPT) 22.3-6.8 MG/ML ophthalmic solution Place 1 drop into both eyes 3 (three) times daily.    Marland Kitchen escitalopram (LEXAPRO) 10 MG tablet Take 10 mg by mouth daily.    . fentaNYL (DURAGESIC - DOSED MCG/HR) 50 MCG/HR Apply one patch topically every 72 hours for pain. Remove old patch 10 patch 0  . gabapentin (NEURONTIN) 100 MG capsule Take 100 mg by mouth every 12 (twelve) hours as needed (neuropathy).     . latanoprost (XALATAN) 0.005 % ophthalmic solution Place 1 drop into both eyes at bedtime.   2  . loperamide (IMODIUM) 2 MG capsule Take 4 mg by mouth as needed for diarrhea or loose stools.    Marland Kitchen LORazepam (ATIVAN) 0.5 MG tablet Take 1 tablet (0.5 mg total) by mouth every 8 (eight) hours as needed for anxiety. 30 tablet 0  . Menthol, Topical Analgesic, 4 % GEL Apply 1 application topically every 6 (six) hours as needed (pain). Apply to shoulders/lower back    . nitroGLYCERIN (NITROSTAT) 0.4 MG SL tablet Place 1 tablet (0.4 mg total) under the tongue every 5 (five) minutes as needed for chest pain. 25 tablet 3  . omeprazole (PRILOSEC) 40 MG capsule Take 40 mg by mouth at bedtime.     Marland Kitchen oxyCODONE (OXY IR/ROXICODONE) 5 MG immediate release tablet Take 1-2 tablets (5-10 mg total) by mouth every 4 (four) hours as needed for moderate pain. 360 tablet 0  . prasugrel (EFFIENT) 10 MG TABS tablet Take 1 tablet (10 mg total) by mouth daily. 30 tablet 6  . sodium bicarbonate 650 MG tablet Take 650 mg by mouth 2 (two) times daily.      . sulindac (CLINORIL) 200 MG tablet Take 200 mg by mouth 2 (two) times daily.    . vancomycin (VANCOCIN) 1 GM/200ML SOLN Inject 200 mLs (1,000 mg total) into the vein every dialysis. 4000 mL 5  No current facility-administered medications for this visit.     Physical Examination  Filed  Vitals:   10/04/14 0907  BP: 147/70  Pulse: 72  Temp: 97.4 F (36.3 C)  TempSrc: Oral  Resp: 16  Height:  (1.753 m)  Weight: 114 kg (251 lb 5.2 oz)  SpO2: 99%    Body mass index is 37.1 kg/(m^2).  General:  Alert and oriented, no acute distress Abdomen: Soft, non-tender, non-distended, no mass, obese Skin: No rash, no erythema over the graft, Extremity Pulses:  2+ radial, brachial, femoral, dorsalis pedis, posterior tibial pulses bilaterally Musculoskeletal: No deformity diffuse edema left lower extremity with pitting and over the graft  Neurologic: Upper and lower extremity motor 5/5 and symmetric  ASSESSMENT:  Most likely venous hypertension in this patient who now has only one access site.   PLAN:  Left thigh shuntogram possible angioplasty or other intervention on Tuesday, June 14 by my partner Dr. Myra Gianotti. Risks benefits possible complications and procedure details discussed with the patient today.  Fabienne Bruns, MD [facility] [place] Office: [phone] Pager: [phone]

## 2014-10-07 ENCOUNTER — Inpatient Hospital Stay (HOSPITAL_COMMUNITY)
Admission: EM | Admit: 2014-10-07 | Discharge: 2014-10-10 | DRG: 252 | Disposition: A | Discharge status: Skilled Nursing Facility | Payer: Medicare Other | Attending: Family Medicine | Admitting: Family Medicine

## 2014-10-07 ENCOUNTER — Encounter (HOSPITAL_COMMUNITY): Payer: Self-pay | Admitting: *Deleted

## 2014-10-07 DIAGNOSIS — Y841 Kidney dialysis as the cause of abnormal reaction of the patient, or of later complication, without mention of misadventure at the time of the procedure: Secondary | ICD-10-CM | POA: Diagnosis present

## 2014-10-07 DIAGNOSIS — D62 Acute posthemorrhagic anemia: Secondary | ICD-10-CM | POA: Diagnosis present

## 2014-10-07 DIAGNOSIS — I252 Old myocardial infarction: Secondary | ICD-10-CM

## 2014-10-07 DIAGNOSIS — F419 Anxiety disorder, unspecified: Secondary | ICD-10-CM | POA: Diagnosis present

## 2014-10-07 DIAGNOSIS — N2581 Secondary hyperparathyroidism of renal origin: Secondary | ICD-10-CM | POA: Diagnosis present

## 2014-10-07 DIAGNOSIS — F329 Major depressive disorder, single episode, unspecified: Secondary | ICD-10-CM | POA: Diagnosis present

## 2014-10-07 DIAGNOSIS — G8929 Other chronic pain: Secondary | ICD-10-CM | POA: Diagnosis present

## 2014-10-07 DIAGNOSIS — Z7982 Long term (current) use of aspirin: Secondary | ICD-10-CM

## 2014-10-07 DIAGNOSIS — I10 Essential (primary) hypertension: Secondary | ICD-10-CM | POA: Diagnosis not present

## 2014-10-07 DIAGNOSIS — T82898A Other specified complication of vascular prosthetic devices, implants and grafts, initial encounter: Secondary | ICD-10-CM | POA: Diagnosis not present

## 2014-10-07 DIAGNOSIS — T827XXD Infection and inflammatory reaction due to other cardiac and vascular devices, implants and grafts, subsequent encounter: Secondary | ICD-10-CM | POA: Diagnosis not present

## 2014-10-07 DIAGNOSIS — M1990 Unspecified osteoarthritis, unspecified site: Secondary | ICD-10-CM | POA: Diagnosis present

## 2014-10-07 DIAGNOSIS — D6489 Other specified anemias: Secondary | ICD-10-CM

## 2014-10-07 DIAGNOSIS — E785 Hyperlipidemia, unspecified: Secondary | ICD-10-CM | POA: Diagnosis present

## 2014-10-07 DIAGNOSIS — B182 Chronic viral hepatitis C: Secondary | ICD-10-CM | POA: Diagnosis present

## 2014-10-07 DIAGNOSIS — I120 Hypertensive chronic kidney disease with stage 5 chronic kidney disease or end stage renal disease: Secondary | ICD-10-CM | POA: Diagnosis present

## 2014-10-07 DIAGNOSIS — G5601 Carpal tunnel syndrome, right upper limb: Secondary | ICD-10-CM | POA: Diagnosis present

## 2014-10-07 DIAGNOSIS — T82838A Hemorrhage of vascular prosthetic devices, implants and grafts, initial encounter: Principal | ICD-10-CM | POA: Diagnosis present

## 2014-10-07 DIAGNOSIS — I5032 Chronic diastolic (congestive) heart failure: Secondary | ICD-10-CM | POA: Diagnosis present

## 2014-10-07 DIAGNOSIS — Z96649 Presence of unspecified artificial hip joint: Secondary | ICD-10-CM | POA: Diagnosis present

## 2014-10-07 DIAGNOSIS — N186 End stage renal disease: Secondary | ICD-10-CM | POA: Diagnosis present

## 2014-10-07 DIAGNOSIS — Z955 Presence of coronary angioplasty implant and graft: Secondary | ICD-10-CM | POA: Diagnosis not present

## 2014-10-07 DIAGNOSIS — I2510 Atherosclerotic heart disease of native coronary artery without angina pectoris: Secondary | ICD-10-CM | POA: Diagnosis present

## 2014-10-07 DIAGNOSIS — D649 Anemia, unspecified: Secondary | ICD-10-CM

## 2014-10-07 DIAGNOSIS — I5022 Chronic systolic (congestive) heart failure: Secondary | ICD-10-CM | POA: Diagnosis not present

## 2014-10-07 DIAGNOSIS — E119 Type 2 diabetes mellitus without complications: Secondary | ICD-10-CM | POA: Diagnosis present

## 2014-10-07 DIAGNOSIS — K219 Gastro-esophageal reflux disease without esophagitis: Secondary | ICD-10-CM | POA: Diagnosis present

## 2014-10-07 DIAGNOSIS — G629 Polyneuropathy, unspecified: Secondary | ICD-10-CM | POA: Diagnosis present

## 2014-10-07 DIAGNOSIS — G43909 Migraine, unspecified, not intractable, without status migrainosus: Secondary | ICD-10-CM | POA: Diagnosis present

## 2014-10-07 DIAGNOSIS — T82898D Other specified complication of vascular prosthetic devices, implants and grafts, subsequent encounter: Secondary | ICD-10-CM | POA: Diagnosis not present

## 2014-10-07 DIAGNOSIS — I509 Heart failure, unspecified: Secondary | ICD-10-CM

## 2014-10-07 DIAGNOSIS — G510 Bell's palsy: Secondary | ICD-10-CM | POA: Diagnosis present

## 2014-10-07 DIAGNOSIS — T82838D Hemorrhage of vascular prosthetic devices, implants and grafts, subsequent encounter: Secondary | ICD-10-CM | POA: Diagnosis not present

## 2014-10-07 DIAGNOSIS — F17210 Nicotine dependence, cigarettes, uncomplicated: Secondary | ICD-10-CM | POA: Diagnosis present

## 2014-10-07 DIAGNOSIS — Z992 Dependence on renal dialysis: Secondary | ICD-10-CM

## 2014-10-07 DIAGNOSIS — T829XXA Unspecified complication of cardiac and vascular prosthetic device, implant and graft, initial encounter: Secondary | ICD-10-CM

## 2014-10-07 DIAGNOSIS — G5602 Carpal tunnel syndrome, left upper limb: Secondary | ICD-10-CM | POA: Diagnosis present

## 2014-10-07 LAB — COMPREHENSIVE METABOLIC PANEL
ALBUMIN: 2.2 g/dL — AB (ref 3.5–5.0)
ALK PHOS: 181 U/L — AB (ref 38–126)
ALT: 19 U/L (ref 17–63)
ANION GAP: 14 (ref 5–15)
AST: 60 U/L — ABNORMAL HIGH (ref 15–41)
BUN: 57 mg/dL — ABNORMAL HIGH (ref 6–20)
CALCIUM: 7.4 mg/dL — AB (ref 8.9–10.3)
CO2: 25 mmol/L (ref 22–32)
CREATININE: 8.42 mg/dL — AB (ref 0.61–1.24)
Chloride: 97 mmol/L — ABNORMAL LOW (ref 101–111)
GFR calc non Af Amer: 6 mL/min — ABNORMAL LOW (ref >60)
GFR, EST AFRICAN AMERICAN: 7 mL/min — AB (ref >60)
Glucose, Bld: 74 mg/dL (ref 65–99)
Potassium: 5.2 mmol/L — ABNORMAL HIGH (ref 3.5–5.1)
Sodium: 136 mmol/L (ref 135–145)
Total Bilirubin: 1.2 mg/dL (ref 0.3–1.2)
Total Protein: 6.5 g/dL (ref 6.5–8.1)

## 2014-10-07 LAB — CBC
HCT: 24 % — ABNORMAL LOW (ref 39.0–52.0)
HEMOGLOBIN: 7.5 g/dL — AB (ref 13.0–17.0)
MCH: 29 pg (ref 26.0–34.0)
MCHC: 31.3 g/dL (ref 30.0–36.0)
MCV: 92.7 fL (ref 78.0–100.0)
Platelets: 391 10*3/uL (ref 150–400)
RBC: 2.59 MIL/uL — ABNORMAL LOW (ref 4.22–5.81)
RDW: 16.6 % — AB (ref 11.5–15.5)
WBC: 13.4 10*3/uL — ABNORMAL HIGH (ref 4.0–10.5)

## 2014-10-07 LAB — GLUCOSE, CAPILLARY
GLUCOSE-CAPILLARY: 87 mg/dL (ref 65–99)
Glucose-Capillary: 80 mg/dL (ref 65–99)

## 2014-10-07 LAB — APTT: aPTT: 34 seconds (ref 24–37)

## 2014-10-07 LAB — PROTIME-INR
INR: 1.26 (ref 0.00–1.49)
Prothrombin Time: 15.9 seconds — ABNORMAL HIGH (ref 11.6–15.2)

## 2014-10-07 LAB — VANCOMYCIN, TROUGH: VANCOMYCIN TR: 10 ug/mL (ref 10.0–20.0)

## 2014-10-07 LAB — MRSA PCR SCREENING: MRSA by PCR: NEGATIVE

## 2014-10-07 MED ORDER — LORAZEPAM 0.5 MG PO TABS
0.5000 mg | ORAL_TABLET | Freq: Three times a day (TID) | ORAL | Status: DC | PRN
  Administered 2014-10-07 – 2014-10-09 (×4): 0.5 mg via ORAL
  Filled 2014-10-07 (×4): qty 1

## 2014-10-07 MED ORDER — ATORVASTATIN CALCIUM 80 MG PO TABS
80.0000 mg | ORAL_TABLET | Freq: Every day | ORAL | Status: DC
  Administered 2014-10-07 – 2014-10-10 (×4): 80 mg via ORAL
  Filled 2014-10-07 (×4): qty 1

## 2014-10-07 MED ORDER — FENTANYL 50 MCG/HR TD PT72: 50.0000 ug | MEDICATED_PATCH | TRANSDERMAL | Status: DC

## 2014-10-07 MED ORDER — SODIUM BICARBONATE 650 MG PO TABS
650.0000 mg | ORAL_TABLET | Freq: Two times a day (BID) | ORAL | Status: DC
  Administered 2014-10-07 – 2014-10-10 (×6): 650 mg via ORAL
  Filled 2014-10-07 (×7): qty 1

## 2014-10-07 MED ORDER — CALCIUM CARBONATE ANTACID 500 MG PO CHEW
3.0000 | CHEWABLE_TABLET | Freq: Three times a day (TID) | ORAL | Status: DC
  Administered 2014-10-07 – 2014-10-09 (×6): 600 mg via ORAL
  Filled 2014-10-07 (×13): qty 3

## 2014-10-07 MED ORDER — LATANOPROST 0.005 % OP SOLN
1.0000 [drp] | Freq: Every day | OPHTHALMIC | Status: DC
  Administered 2014-10-07 – 2014-10-09 (×3): 1 [drp] via OPHTHALMIC
  Filled 2014-10-07 (×2): qty 2.5

## 2014-10-07 MED ORDER — SODIUM CHLORIDE 0.9 % IV SOLN
1500.0000 mg | Freq: Once | INTRAVENOUS | Status: AC
  Administered 2014-10-07: 1500 mg via INTRAVENOUS
  Filled 2014-10-07: qty 1500

## 2014-10-07 MED ORDER — PRASUGREL HCL 10 MG PO TABS: 10.0000 mg | ORAL_TABLET | Freq: Every day | ORAL | Status: DC

## 2014-10-07 MED ORDER — MUSCLE RUB 10-15 % EX CREA
TOPICAL_CREAM | Freq: Four times a day (QID) | CUTANEOUS | Status: DC | PRN
  Administered 2014-10-09: 02:00:00 via TOPICAL
  Filled 2014-10-07: qty 85

## 2014-10-07 MED ORDER — VANCOMYCIN HCL IN DEXTROSE 1-5 GM/200ML-% IV SOLN
1000.0000 mg | Freq: Once | INTRAVENOUS | Status: DC
  Filled 2014-10-07: qty 200

## 2014-10-07 MED ORDER — OXYCODONE HCL 5 MG PO TABS
5.0000 mg | ORAL_TABLET | Freq: Four times a day (QID) | ORAL | Status: DC | PRN
  Administered 2014-10-07: 5 mg via ORAL
  Filled 2014-10-07: qty 1

## 2014-10-07 MED ORDER — FENTANYL 50 MCG/HR TD PT72
50.0000 ug | MEDICATED_PATCH | TRANSDERMAL | Status: DC
  Administered 2014-10-07: 50 ug via TRANSDERMAL
  Filled 2014-10-07: qty 1

## 2014-10-07 MED ORDER — LIDOCAINE HCL (PF) 1 % IJ SOLN
30.0000 mL | Freq: Once | INTRAMUSCULAR | Status: AC
  Administered 2014-10-07: 30 mL via INTRADERMAL
  Filled 2014-10-07: qty 30

## 2014-10-07 MED ORDER — PRO-STAT SUGAR FREE PO LIQD
30.0000 mL | Freq: Two times a day (BID) | ORAL | Status: DC
  Administered 2014-10-07 – 2014-10-10 (×6): 30 mL via ORAL
  Filled 2014-10-07 (×7): qty 30

## 2014-10-07 MED ORDER — MORPHINE SULFATE 2 MG/ML IJ SOLN
2.0000 mg | Freq: Once | INTRAMUSCULAR | Status: AC
  Administered 2014-10-07: 2 mg via INTRAVENOUS
  Filled 2014-10-07: qty 1

## 2014-10-07 MED ORDER — DORZOLAMIDE HCL-TIMOLOL MAL 2-0.5 % OP SOLN
1.0000 [drp] | Freq: Three times a day (TID) | OPHTHALMIC | Status: DC
  Administered 2014-10-07 – 2014-10-10 (×7): 1 [drp] via OPHTHALMIC
  Filled 2014-10-07 (×2): qty 10

## 2014-10-07 MED ORDER — SODIUM CHLORIDE 0.9 % IV SOLN: Freq: Once | INTRAVENOUS | Status: AC

## 2014-10-07 MED ORDER — OXYCODONE HCL 5 MG PO TABS
5.0000 mg | ORAL_TABLET | Freq: Four times a day (QID) | ORAL | Status: DC | PRN
  Administered 2014-10-08: 5 mg via ORAL
  Administered 2014-10-08: 10 mg via ORAL
  Administered 2014-10-08: 5 mg via ORAL
  Administered 2014-10-08 – 2014-10-10 (×5): 10 mg via ORAL
  Filled 2014-10-07: qty 1
  Filled 2014-10-07 (×2): qty 2
  Filled 2014-10-07: qty 1
  Filled 2014-10-07 (×3): qty 2

## 2014-10-07 MED ORDER — MENTHOL (TOPICAL ANALGESIC) 4 % EX GEL: 1.0000 "application " | Freq: Four times a day (QID) | CUTANEOUS | Status: DC | PRN

## 2014-10-07 MED ORDER — VANCOMYCIN HCL IN DEXTROSE 1-5 GM/200ML-% IV SOLN
1000.0000 mg | INTRAVENOUS | Status: DC
  Administered 2014-10-08: 1000 mg via INTRAVENOUS
  Filled 2014-10-07 (×3): qty 200

## 2014-10-07 MED ORDER — ASPIRIN EC 81 MG PO TBEC: 81.0000 mg | DELAYED_RELEASE_TABLET | Freq: Every day | ORAL | Status: DC

## 2014-10-07 MED ORDER — PANTOPRAZOLE SODIUM 40 MG PO TBEC
40.0000 mg | DELAYED_RELEASE_TABLET | Freq: Every day | ORAL | Status: DC
  Administered 2014-10-07 – 2014-10-10 (×4): 40 mg via ORAL
  Filled 2014-10-07 (×3): qty 1

## 2014-10-07 MED ORDER — B COMPLEX-C PO TABS
1.0000 | ORAL_TABLET | Freq: Every day | ORAL | Status: DC
  Administered 2014-10-07 – 2014-10-10 (×4): 1 via ORAL
  Filled 2014-10-07 (×5): qty 1

## 2014-10-07 MED ORDER — ESCITALOPRAM OXALATE 10 MG PO TABS
10.0000 mg | ORAL_TABLET | Freq: Every day | ORAL | Status: DC
  Administered 2014-10-08 – 2014-10-10 (×3): 10 mg via ORAL
  Filled 2014-10-07 (×3): qty 1

## 2014-10-07 MED ORDER — ASPIRIN EC 81 MG PO TBEC
81.0000 mg | DELAYED_RELEASE_TABLET | Freq: Every day | ORAL | Status: DC
  Administered 2014-10-07 – 2014-10-10 (×4): 81 mg via ORAL
  Filled 2014-10-07 (×4): qty 1

## 2014-10-07 NOTE — ED Notes (Signed)
Helped aide clean pt up.  Pt placed in blue gown.

## 2014-10-07 NOTE — Progress Notes (Addendum)
ANTIBIOTIC CONSULT NOTE - INITIAL  Pharmacy Consult for vancomycin Indication: wound infection at AV graft site  No Known Allergies  Patient Measurements: Height: 5\' 11"  (180.3 cm) Weight: 250 lb 4.8 oz (113.535 kg) IBW/kg (Calculated) : 75.3 Adjusted Body Weight: n/a  Vital Signs: Temp: 97.8 F (36.6 C) (06/12 1710) Temp Source: Oral (06/12 1710) BP: 111/65 mmHg (06/12 1710) Pulse Rate: 75 (06/12 1710) Intake/Output from previous day:   Intake/Output from this shift:    Labs:  Recent Labs  10/07/14 1444  WBC 13.4*  HGB 7.5*  PLT 391  CREATININE 8.42*   Estimated Creatinine Clearance: 12.1 mL/min (by C-G formula based on Cr of 8.42). No results for input(s): VANCOTROUGH, VANCOPEAK, VANCORANDOM, GENTTROUGH, GENTPEAK, GENTRANDOM, TOBRATROUGH, TOBRAPEAK, TOBRARND, AMIKACINPEAK, AMIKACINTROU, AMIKACIN in the last 72 hours.   Microbiology: No results found for this or any previous visit (from the past 720 hour(s)).  Medical History: Past Medical History  Diagnosis Date  . Anxiety   . Back pain   . GERD (gastroesophageal reflux disease)   . Peripheral neuropathy   . Arthritis   . Chronic diastolic CHF (congestive heart failure)     a. 04/2013 Echo: EF nl, no rwma, mild LVH.  Marland Kitchen Active smoker   . ESRD on hemodialysis     [name] 4 days per week on M-Tu-Wed and Fri.  Started HD in 1998 and has been on HD initially at [facility], then went to [facility], then in [facility], then to [facility] and now is at [facility] for last 10 years.  Has L thigh AVG.     . Diabetes mellitus     diet controlled  . 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 PONV (postoperative nausea and vomiting)   . Hypertension   . Bell palsy   . Carpal tunnel syndrome     bilateral  . Palpitations   . Headache(784.0)     Hx: of Migraines  . CAD (coronary artery disease)     a. 04/2013 Ant STEMI/PCI: LM nl, LAD 95p (3.0x18 Xience DES), D1/2/3 small, LCX 80ost, RI 20p, RCA 146m CTO  (unsuccessful PCI), EF 55%, mod basal inf HK.  . S/P coronary artery stent placement 05/21/2013    Xience Alpine Medtronic conditional 5 @ 1.5 and 3T     Assessment: 59 yo male admitted with wound infection of AV graft.  Per MD order, has been receiving antibiotics at outpatient dialysis.  Last dialysis session was Friday 6/10.  Assume he had a dose of vancomycin at the end of session, but checking random level now to verify.    Goal of Therapy:  Pre-HD vancomycin trough 15-25  Plan:  1. Vancomycin random level now.  Tad Moore, BCPS  Clinical Pharmacist Pager [phone]  10/07/2014 6:58 PM    Addendum : vancomycin random level = 10,  Will give dose of 1500 mg IV x 1 now, then will need at least 1g after each HD session, will attempt to verify outpatient dose in the AM.  Reece Leader, Loura Back, BCPS  Clinical Pharmacist Pager [phone]  10/07/2014 8:30 PM

## 2014-10-07 NOTE — ED Notes (Signed)
Will transport pt up when admitting doctor is done seeing pt.

## 2014-10-07 NOTE — ED Notes (Signed)
Pt arrived by [facility], last dialysis was yesterday. Pt removed bandage from fistula left leg and had moderate blood loss pta, bleeding now controlled with pressure.

## 2014-10-07 NOTE — Consult Note (Signed)
  59 year old gentleman well-known to our service with the 18 year history of hemodialysis. He had recently has had multiple events regarding his left femoral loop graft. Degeneration of the medial aspect and had replacement of the venous half in April by Dr. Darrick Penna. Early occlusion and had thrombectomy, myself. He then presented to the emergency department with bleeding from the arterial portion of his graft. He had the several sutures placed and had been stable from this. He has had persistent swelling and poor function of his left femoral loop graft. He was seen by Dr. Darrick Penna 3 days ago and was scheduled for shuntogram to her evaluate the graft and 2 days on Tuesday of this coming week. He is in a nursing home. He presented to the emergency department with significant bleed from his left thigh femoral loop graft.  This was initially being controlled with digital pressure. On examination I occluded the graft more proximally towards the arterial anastomosis. This was a new area remote from the area that had been sutured several weeks ago. That area was intact with no bleeding. This was a new area of degeneration further distally on the arterial limb of the graft. Under sterile additions with Betadine prep, the area of ulceration was controlled with 3 30 proline sutures. Pressure was released from the proximal graft and bleeding was completely controlled. There continued to be flow in the graft.  Patient is approaching the end of function of this graft. Has had recurrent breakdown in the arterial limb after having replacement of the venous limb. Blood work is pending., And what his hemoglobin and hematocrit is. Does have what appears to be fluid overload with marked bilateral lower extremity swelling. Agree with need for shuntogram on Tuesday for further evaluation. Being resuscitated in the emergency room for his blood loss and we will follow along after admission

## 2014-10-07 NOTE — H&P (Signed)
Family Medicine Teaching [facility] Admission History and Physical Service Pager: [phone]  Patient name: [name] Medical record number: [id number] Date of birth: Sep 25, 1955 Age: 59 y.o. Gender: male  Primary Care Provider: Dyke Maes, MD Consultants: Vascular Surgery, Nephrology Code Status: Full code  Chief Complaint: Bleeding at graft  Assessment and Plan: [name] is a 59 y.o. male presenting with bleeding at AV graft site. PMH is significant for HTN, Diabetes mellutis type 2, ESRD, CAD s/p stents x2, Chronic pain secondary to degenerative disc disease  #Bleeding from AV graft: unfortunately this is the last site available for a graft. Per chart review, vascular has been trying to preserve this graft with the help of infectious disease and nephrology. Plan outpatient was for a shuntogram this upcoming week. Currently, bleeding has stopped and vitals are stable. Hemoglobin dropped from baseline of 9-10 to 7.5.  - admit to inpatient, med-surg, Dr. Deirdre Priest - follow vascular recommendations - hold Effient, OK to continue aspirin, SCDs per vascular - monitor vitals overnight, may need stat CBC +/- stat transfusion if possibly still bleeding  #Infection of AV graft: follows ID outpatient. Has been on antibiotics since April 2016. Initially on Ceftaz with plans for a 6 week course and subsequent switch to 3-6 months of Augmentin for suppressive therapy.; After visit about one week ago, plan revised secondary to purulent discharge. Currently on a 6-8 week course of IV vancomycin. Patient is afebrile and without a SIRS picture. Received one dose of vancomycin in the ED, although is supposed to obtain with dialysis - vancomycin per pharmacy - fever curve - blood cultures x2 if develops a fever  #Normocytic, normochromic anemia; acute on chronic: most likely from acute blood loss. Hemoglobin drop to 7.5 with a baseline of 9-10. Also with history of CAD. - transfuse  during dialysis per nephrology - follow-up CBC in AM as bleeding currently controlled  #ESRD on dialysis (MWF, sometimes Tuesday): last dialysis session last Friday. See's [facility] outpatient and obtains dialysis at [facility] - nephrology consulted in ED; will follow recommendations - continue home sodium bicarbonate  BID - continue Tums - Cmet in AM  #Chronic pain - hold gabapentin, sulindac - continue fentanyl patch - decrease home oxycodone to 5-10mg  q6hrs  #DM2: not on medication outpatient. Last A1C of 5.4 in April of 2016 - CBG daily  #CAD s/p stents - continue home aspirin - continue home atorvastatin - hold Effient per vascular surgery  #Depression: stable - continue Lexapro  FEN/GI: Renal diet, SLIV Prophylaxis: SCD, right leg  Disposition: Admit to inpatient  History of Present Illness: [name] is a 59 y.o. male presenting with bleeding from AV graft site. Patient reports that he recently had a revision performed on his AV graft. Earlier today, he reports taking off the dressing, which caused a scab to be removed. He reports he had excessive bleeding from that area and required transport to the ED for evaluation. While in the ED, vascular surgery was called and achieved hemostasis by stitching the area of ulceration. Patient was supposed to have a shuntogram performed on 6/14, but with current complications, vascular wanted to keep patient in hospital and consider shuntogram in AM. Patient received vancomycin and morphine in the ED.  Patient has been on dialysis for the past 18 years. He has had multiple grafts with failure in both upper extremities and right lower extremity. He has had multiple issues with current graft located in left thigh, including  revisions, infection and bleeding.   Review Of Systems: Per HPI with the following additions: None Otherwise 12 point review of systems was performed and was unremarkable.  Patient Active  Problem List   Diagnosis Date Noted  . ESRD on dialysis 10/07/2014  . Chronic hepatitis C without hepatic coma 09/26/2014  . Chronic HF (heart failure) 08/19/2014  . Back pain   . Abnormal EKG   . Abnormal liver function test   . Lumbago   . Diabetic foot ulcer   . Myositis   . NSTEMI (non-ST elevated myocardial infarction)   . Thigh pain   . Infection and inflammatory reaction due to cardiac device, implant, and graft   . Elevated troponin 08/10/2014  . Abnormal transaminases 08/10/2014  . Sepsis associated hypotension 08/10/2014  . Chest pain 08/10/2014  . Weakness 08/10/2014  . CAD (coronary artery disease)   . Shock circulatory   . Pain in the chest   . Spinal stenosis of lumbar region   . Cellulitis of left lower extremity   . Hyperkalemia   . Acute superficial venous thrombosis of lower extremity   . Radicular leg pain   . Right-sided low back pain with right-sided sciatica   . Right leg pain 06/19/2014  . Acute myocardial infarction of right ventricle 05/22/2013  . History of placement of stent in LAD coronary artery 05/22/2013  . HTN (hypertension) 05/22/2013  . End-stage renal disease on hemodialysis 07/23/2011  . Arthritis    Past Medical History: Past Medical History  Diagnosis Date  . Anxiety   . Back pain   . GERD (gastroesophageal reflux disease)   . Peripheral neuropathy   . Arthritis   . Chronic diastolic CHF (congestive heart failure)     a. 04/2013 Echo: EF nl, no rwma, mild LVH.  Marland Kitchen Active smoker   . ESRD on hemodialysis     [name] 4 days per week on M-Tu-Wed and Fri.  Started HD in 1998 and has been on HD initially at [facility], then went to [facility], then in [facility], then to [facility] and now is at [facility] for last 10 years.  Has L thigh AVG.     . Diabetes mellitus     diet controlled  . 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 PONV (postoperative nausea and vomiting)   . Hypertension   . Bell palsy   . Carpal tunnel  syndrome     bilateral  . Palpitations   . Headache(784.0)     Hx: of Migraines  . CAD (coronary artery disease)     a. 04/2013 Ant STEMI/PCI: LM nl, LAD 95p (3.0x18 Xience DES), D1/2/3 small, LCX 80ost, RI 20p, RCA 167m CTO (unsuccessful PCI), EF 55%, mod basal inf HK.  . S/P coronary artery stent placement 05/21/2013    Xience Alpine Medtronic conditional 5 @ 1.5 and 3T    Past Surgical History: Past Surgical History  Procedure Laterality Date  . Total hip arthroplasty    . Thyroidectomy    . Tooth extraction    . Mandible fracture surgery    . Dg av dialysis graft declot or    . Insertion of dialysis catheter  03/28/2011    Procedure: INSERTION OF DIALYSIS CATHETER;  Surgeon: Pryor Ochoa, MD;  Location: [facility] OR;  Service: Vascular;  Laterality: Left;  . Av fistula placement  03/31/2011    Procedure: INSERTION OF ARTERIOVENOUS (AV) GORE-TEX  GRAFT THIGH;  Surgeon: Sherren Kerns, MD;  Location: [facility] OR;  Service: Vascular;  Laterality: Right;  redo right thigh arteriovenous gortex graft using gore-tex stretch 6mm x 70cm  . Thrombectomy w/ embolectomy  06/02/2011    Procedure: THROMBECTOMY ARTERIOVENOUS GORE-TEX GRAFT;  Surgeon: Chuck Hint, MD;  Location: [facility] OR;  Service: Vascular;  Laterality: Right;  Thrombectomy right thigh arteriovenous gortex graft;  revision by  replacement of large portion of graft with 7mm gore-tex   . Insertion of dialysis catheter  08/28/2011    Procedure: INSERTION OF DIALYSIS CATHETER;  Surgeon: Fransisco Hertz, MD;  Location: [facility] OR;  Service: Vascular;  Laterality: Left;  Atempted Bilateral Internal Jugular, Bilateral Subclavin insertion of 55cm Dialysis Catheter Left Femoral  . Insertion of dialysis catheter  12/15/2011    Procedure: INSERTION OF DIALYSIS CATHETER;  Surgeon: Sherren Kerns, MD;  Location: [facility] OR;  Service: Vascular;  Laterality: Right;  Insertion of Right Femoral Dialysis Catheter  . Exchange of a dialysis catheter  02/26/2012     Procedure: EXCHANGE OF A DIALYSIS CATHETER;  Surgeon: Larina Earthly, MD;  Location: [facility] OR;  Service: Vascular;  Laterality: Right;  . Joint replacement    . Exchange of a dialysis catheter  05/18/2012    Procedure: EXCHANGE OF A DIALYSIS CATHETER;  Surgeon: Larina Earthly, MD;  Location: [facility] OR;  Service: Vascular;  Laterality: Right;  right femoral dialysis catheter  . Av fistula placement  05/18/2012    Procedure: INSERTION OF ARTERIOVENOUS (AV) GORE-TEX GRAFT THIGH;  Surgeon: Larina Earthly, MD;  Location: [facility] OR;  Service: Vascular;  Laterality: Left;  using 6mm by 70cm goretex graft  . Thrombectomy w/ embolectomy Left 08/25/2012    Procedure: THROMBECTOMY ARTERIOVENOUS GORE-TEX GRAFT;  Surgeon: Pryor Ochoa, MD;  Location: [facility] OR;  Service: Vascular;  Laterality: Left;  Attempted thrombectomy of left thigh arteriovenous gortex graft.   . Av fistula placement Left 08/25/2012    Procedure: INSERTION OF ARTERIOVENOUS (AV) GORE-TEX GRAFT THIGH;  Surgeon: Pryor Ochoa, MD;  Location: [facility] OR;  Service: Vascular;  Laterality: Left;  Using 6mm x 40cm vascular Gortex graft.  . Revision of arteriovenous goretex graft Left 12/14/2012    Procedure: Revision of Left Thigh Graft;  Surgeon: Larina Earthly, MD;  Location: [facility] OR;  Service: Vascular;  Laterality: Left;  . Avgg removal Left 02/16/2013    Procedure: EXCISION OF LEFT ARM ARTERIOVENOUS GORETEX GRAFT TIMES 2 WITH VEIN PATCH ANGIOPLASTY OF BRACIAL ARTERY.  ;  Surgeon: Chuck Hint, MD;  Location: [facility] OR;  Service: Vascular;  Laterality: Left;  Converted from MAC to General.    . Revision of arteriovenous goretex graft Left 08/08/2013    Procedure: REVISION OF LEFT THIGH ARTERIOVENOUS GORETEX GRAFT;  Surgeon: Sherren Kerns, MD;  Location: [facility] OR;  Service: Vascular;  Laterality: Left;  . Venogram Bilateral 10/27/2011    Procedure: VENOGRAM;  Surgeon: Nada Libman, MD;  Location: [facility] CATH LAB;  Service: Cardiovascular;  Laterality: Bilateral;  bilat upper  extrem venograms  . Left heart cath Bilateral 05/21/2013    Procedure: LEFT HEART CATH;  Surgeon: Iran Ouch, MD;  Location: [facility] CATH LAB;  Service: Cardiovascular;  Laterality: Bilateral;  . Percutaneous coronary stent intervention (pci-s)  05/21/2013    Procedure: PERCUTANEOUS CORONARY STENT INTERVENTION (PCI-S);  Surgeon: Iran Ouch, MD;  Location: [facility] CATH LAB;  Service: Cardiovascular;;  . Coronary angioplasty with stent placement    .  Radiology with anesthesia N/A 06/27/2014    Procedure: MRI LUMBER WITHOUT CONTRAST;  Surgeon: Medication Radiologist, MD;  Location: [facility] OR;  Service: Radiology;  Laterality: N/A;  . Revision of arteriovenous goretex graft Left 08/13/2014    Procedure: REVISION OF ARTERIOVENOUS GORETEX GRAFT LEFT THIGH;  Surgeon: Sherren Kerns, MD;  Location: [facility] OR;  Service: Vascular;  Laterality: Left;  . Radiology with anesthesia N/A 08/16/2014    Procedure: MRI;  Surgeon: Medication Radiologist, MD;  Location: [facility] OR;  Service: Radiology;  Laterality: N/A;  . Thrombectomy w/ embolectomy Left 08/22/2014    Procedure: THROMBECTOMY ARTERIOVENOUS GORE-TEX GRAFT Of Left Thigh Graft;  Surgeon: Larina Earthly, MD;  Location: [facility] OR;  Service: Vascular;  Laterality: Left;   Social History: History  Substance Use Topics  . Smoking status: Current Every Day Smoker -- 0.20 packs/day for 40 years    Types: Cigarettes  . Smokeless tobacco: Never Used  . Alcohol Use: No   Additional social history: None  Please also refer to relevant sections of EMR.  Family History: Family History  Problem Relation Age of Onset  . Diabetes Mother   . Stroke Mother   . Heart disease Mother   . Kidney disease Father   . Hyperlipidemia Father    Allergies and Medications: No Known Allergies No current facility-administered medications on file prior to encounter.   Current Outpatient Prescriptions on File Prior to Encounter  Medication Sig Dispense Refill  . Amino Acids-Protein  Hydrolys (FEEDING SUPPLEMENT, PRO-STAT SUGAR FREE 64,) LIQD Take 30 mLs by mouth 2 (two) times daily.    Marland Kitchen aspirin 81 MG tablet Take 1 tablet (81 mg total) by mouth daily.    Marland Kitchen atorvastatin (LIPITOR) 80 MG tablet Take 1 tablet (80 mg total) by mouth daily at 6 PM. 30 tablet 11  . B Complex-C (B-COMPLEX WITH VITAMIN C) tablet Take 1 tablet by mouth at bedtime.    . calcium carbonate (TUMS - DOSED IN MG ELEMENTAL CALCIUM) 500 MG chewable tablet Chew 3 tablets by mouth 3 (three) times daily.    . colchicine 0.6 MG tablet Take 0.6 mg by mouth every 12 (twelve) hours as needed (gout).     . dorzolamide-timolol (COSOPT) 22.3-6.8 MG/ML ophthalmic solution Place 1 drop into both eyes 3 (three) times daily.    Marland Kitchen escitalopram (LEXAPRO) 10 MG tablet Take 10 mg by mouth daily.    . fentaNYL (DURAGESIC - DOSED MCG/HR) 50 MCG/HR Apply one patch topically every 72 hours for pain. Remove old patch (Patient taking differently: Place 50 mcg onto the skin every 3 (three) days. Apply one patch topically every 72 hours for pain. Remove old patch) 10 patch 0  . gabapentin (NEURONTIN) 100 MG capsule Take 100 mg by mouth every 12 (twelve) hours as needed (neuropathy).     . latanoprost (XALATAN) 0.005 % ophthalmic solution Place 1 drop into both eyes at bedtime.   2  . LORazepam (ATIVAN) 0.5 MG tablet Take 1 tablet (0.5 mg total) by mouth every 8 (eight) hours as needed for anxiety. 30 tablet 0  . Menthol, Topical Analgesic, 4 % GEL Apply 1 application topically every 6 (six) hours as needed (pain). Apply to shoulders/lower back    . nitroGLYCERIN (NITROSTAT) 0.4 MG SL tablet Place 1 tablet (0.4 mg total) under the tongue every 5 (five) minutes as needed for chest pain. 25 tablet 3  . omeprazole (PRILOSEC) 40 MG capsule Take 40 mg by mouth at bedtime.     Marland Kitchen  oxyCODONE (OXY IR/ROXICODONE) 5 MG immediate release tablet Take 1-2 tablets (5-10 mg total) by mouth every 4 (four) hours as needed for moderate pain. (Patient taking  differently: Take 5-10 mg by mouth every 4 (four) hours as needed (1 tablet (5 mg) moderate pain; 2 tablets (10 mg) severe pain). ) 360 tablet 0  . prasugrel (EFFIENT) 10 MG TABS tablet Take 1 tablet (10 mg total) by mouth daily. 30 tablet 6  . sodium bicarbonate 650 MG tablet Take 650 mg by mouth 2 (two) times daily.      . sulindac (CLINORIL) 200 MG tablet Take 200 mg by mouth 2 (two) times daily.    . vancomycin (VANCOCIN) 1 GM/200ML SOLN Inject 200 mLs (1,000 mg total) into the vein every dialysis. 4000 mL 5    Objective: BP 103/74 mmHg  Pulse 71  Temp(Src) 97.7 F (36.5 C) (Oral)  Resp 18  SpO2 100%  Exam: General: Obese, laying in bed on left side, sleepy, no distress. Easily arousable Eyes: PER and minimally reactive to light ENTM: Moist mucous membranes Neck: No adenopathy Cardiovascular: Regular rate and rhythm, no murmur, distant heart sounds Respiratory: Clear to auscultation bilaterally, unlabored Abdomen: Soft, non-tender, large pannus, no rebound or guarding MSK: 2+ pitting edema up to knee on right and up to thigh on left. Tenderness of left leg Skin: 2.5cm incision that is sutured. No bleeding around the area. Area is not erythematous. Neuro: Alert, oriented, sleepy but easily arousable Psych: Flat affect  Labs and Imaging: CBC BMET   Recent Labs Lab 10/07/14 1444  WBC 13.4*  HGB 7.5*  HCT 24.0*  PLT 391    Recent Labs Lab 10/07/14 1444  NA 136  K 5.2*  CL 97*  CO2 25  BUN 57*  CREATININE 8.42*  GLUCOSE 74  CALCIUM 7.4*     No results found.  Narda Bonds, MD 10/07/2014, 4:01 PM PGY-2, [facility] FPTS Intern pager: [phone], text pages welcome

## 2014-10-07 NOTE — ED Notes (Signed)
Report given to Albert, RN.

## 2014-10-07 NOTE — ED Provider Notes (Addendum)
CSN: [id number]     Arrival date & time 10/07/14  1405 History   First MD Initiated Contact with Patient 10/07/14 1414     Chief Complaint  Patient presents with  . Bleeding/Bruising     (Consider location/radiation/quality/duration/timing/severity/associated sxs/prior Treatment) The history is provided by the patient.  Patient with hx ESRD, on HD for past 18 yrs, M/W/F, c/o w bleeding from left groin dialyses graft needle puncture site.  Pt notes recent complication of access including infected graft, and gross left leg pain and swelling to groin.  Had normal dialyses yesterday, today removed gauze/dressing and area began to bleed. Staff held pressure for several minutes, but bleeding persists. Pt denies any other abn bleeding or bruising. No anticoag use (other than hep w dialyses). No faintness or dizziness. States left leg swelling unchanged from recent baseline.      Past Medical History  Diagnosis Date  . Anxiety   . Back pain   . GERD (gastroesophageal reflux disease)   . Peripheral neuropathy   . Arthritis   . Chronic diastolic CHF (congestive heart failure)     a. 04/2013 Echo: EF nl, no rwma, mild LVH.  Marland Kitchen Active smoker   . ESRD on hemodialysis     [name] 4 days per week on M-Tu-Wed and Fri.  Started HD in 1998 and has been on HD initially at [facility], then went to [facility], then in [facility], then to [facility] and now is at [facility] for last 10 years.  Has L thigh AVG.     . Diabetes mellitus     diet controlled  . 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 PONV (postoperative nausea and vomiting)   . Hypertension   . Bell palsy   . Carpal tunnel syndrome     bilateral  . Palpitations   . Headache(784.0)     Hx: of Migraines  . CAD (coronary artery disease)     a. 04/2013 Ant STEMI/PCI: LM nl, LAD 95p (3.0x18 Xience DES), D1/2/3 small, LCX 80ost, RI 20p, RCA 134m CTO (unsuccessful PCI), EF 55%, mod basal inf HK.  . S/P coronary artery stent  placement 05/21/2013    Xience Alpine Medtronic conditional 5 @ 1.5 and 3T    Past Surgical History  Procedure Laterality Date  . Total hip arthroplasty    . Thyroidectomy    . Tooth extraction    . Mandible fracture surgery    . Dg av dialysis graft declot or    . Insertion of dialysis catheter  03/28/2011    Procedure: INSERTION OF DIALYSIS CATHETER;  Surgeon: Pryor Ochoa, MD;  Location: [facility] OR;  Service: Vascular;  Laterality: Left;  . Av fistula placement  03/31/2011    Procedure: INSERTION OF ARTERIOVENOUS (AV) GORE-TEX GRAFT THIGH;  Surgeon: Sherren Kerns, MD;  Location: [facility] OR;  Service: Vascular;  Laterality: Right;  redo right thigh arteriovenous gortex graft using gore-tex stretch 6mm x 70cm  . Thrombectomy w/ embolectomy  06/02/2011    Procedure: THROMBECTOMY ARTERIOVENOUS GORE-TEX GRAFT;  Surgeon: Chuck Hint, MD;  Location: [facility] OR;  Service: Vascular;  Laterality: Right;  Thrombectomy right thigh arteriovenous gortex graft;  revision by  replacement of large portion of graft with 7mm gore-tex   . Insertion of dialysis catheter  08/28/2011    Procedure: INSERTION OF DIALYSIS CATHETER;  Surgeon: Fransisco Hertz, MD;  Location: [facility] OR;  Service:  Vascular;  Laterality: Left;  Atempted Bilateral Internal Jugular, Bilateral Subclavin insertion of 55cm Dialysis Catheter Left Femoral  . Insertion of dialysis catheter  12/15/2011    Procedure: INSERTION OF DIALYSIS CATHETER;  Surgeon: Sherren Kerns, MD;  Location: [facility] OR;  Service: Vascular;  Laterality: Right;  Insertion of Right Femoral Dialysis Catheter  . Exchange of a dialysis catheter  02/26/2012    Procedure: EXCHANGE OF A DIALYSIS CATHETER;  Surgeon: Larina Earthly, MD;  Location: [facility] OR;  Service: Vascular;  Laterality: Right;  . Joint replacement    . Exchange of a dialysis catheter  05/18/2012    Procedure: EXCHANGE OF A DIALYSIS CATHETER;  Surgeon: Larina Earthly, MD;  Location: [facility] OR;  Service: Vascular;  Laterality: Right;   right femoral dialysis catheter  . Av fistula placement  05/18/2012    Procedure: INSERTION OF ARTERIOVENOUS (AV) GORE-TEX GRAFT THIGH;  Surgeon: Larina Earthly, MD;  Location: [facility] OR;  Service: Vascular;  Laterality: Left;  using 6mm by 70cm goretex graft  . Thrombectomy w/ embolectomy Left 08/25/2012    Procedure: THROMBECTOMY ARTERIOVENOUS GORE-TEX GRAFT;  Surgeon: Pryor Ochoa, MD;  Location: [facility] OR;  Service: Vascular;  Laterality: Left;  Attempted thrombectomy of left thigh arteriovenous gortex graft.   . Av fistula placement Left 08/25/2012    Procedure: INSERTION OF ARTERIOVENOUS (AV) GORE-TEX GRAFT THIGH;  Surgeon: Pryor Ochoa, MD;  Location: [facility] OR;  Service: Vascular;  Laterality: Left;  Using 6mm x 40cm vascular Gortex graft.  . Revision of arteriovenous goretex graft Left 12/14/2012    Procedure: Revision of Left Thigh Graft;  Surgeon: Larina Earthly, MD;  Location: [facility] OR;  Service: Vascular;  Laterality: Left;  . Avgg removal Left 02/16/2013    Procedure: EXCISION OF LEFT ARM ARTERIOVENOUS GORETEX GRAFT TIMES 2 WITH VEIN PATCH ANGIOPLASTY OF BRACIAL ARTERY.  ;  Surgeon: Chuck Hint, MD;  Location: [facility] OR;  Service: Vascular;  Laterality: Left;  Converted from MAC to General.    . Revision of arteriovenous goretex graft Left 08/08/2013    Procedure: REVISION OF LEFT THIGH ARTERIOVENOUS GORETEX GRAFT;  Surgeon: Sherren Kerns, MD;  Location: [facility] OR;  Service: Vascular;  Laterality: Left;  . Venogram Bilateral 10/27/2011    Procedure: VENOGRAM;  Surgeon: Nada Libman, MD;  Location: [facility] CATH LAB;  Service: Cardiovascular;  Laterality: Bilateral;  bilat upper extrem venograms  . Left heart cath Bilateral 05/21/2013    Procedure: LEFT HEART CATH;  Surgeon: Iran Ouch, MD;  Location: [facility] CATH LAB;  Service: Cardiovascular;  Laterality: Bilateral;  . Percutaneous coronary stent intervention (pci-s)  05/21/2013    Procedure: PERCUTANEOUS CORONARY STENT INTERVENTION (PCI-S);  Surgeon:  Iran Ouch, MD;  Location: [facility] CATH LAB;  Service: Cardiovascular;;  . Coronary angioplasty with stent placement    . Radiology with anesthesia N/A 06/27/2014    Procedure: MRI LUMBER WITHOUT CONTRAST;  Surgeon: Medication Radiologist, MD;  Location: [facility] OR;  Service: Radiology;  Laterality: N/A;  . Revision of arteriovenous goretex graft Left 08/13/2014    Procedure: REVISION OF ARTERIOVENOUS GORETEX GRAFT LEFT THIGH;  Surgeon: Sherren Kerns, MD;  Location: [facility] OR;  Service: Vascular;  Laterality: Left;  . Radiology with anesthesia N/A 08/16/2014    Procedure: MRI;  Surgeon: Medication Radiologist, MD;  Location: [facility] OR;  Service: Radiology;  Laterality: N/A;  . Thrombectomy w/ embolectomy Left 08/22/2014    Procedure: THROMBECTOMY ARTERIOVENOUS GORE-TEX GRAFT Of Left Thigh Graft;  Surgeon: Larina Earthly, MD;  Location: [facility] OR;  Service: Vascular;  Laterality: Left;   Family History  Problem Relation Age of Onset  . Diabetes Mother   . Stroke Mother   . Heart disease Mother   . Kidney disease Father   . Hyperlipidemia Father    History  Substance Use Topics  . Smoking status: Current Every Day Smoker -- 0.20 packs/day for 40 years    Types: Cigarettes  . Smokeless tobacco: Never Used  . Alcohol Use: No    Review of Systems  Constitutional: Negative for fever and chills.  HENT: Negative for sore throat.   Eyes: Negative for redness.  Respiratory: Negative for shortness of breath.   Cardiovascular: Positive for leg swelling. Negative for chest pain.  Gastrointestinal: Negative for vomiting and abdominal pain.  Genitourinary: Negative for flank pain.  Musculoskeletal: Negative for back pain and neck pain.  Skin: Negative for rash.  Neurological: Negative for headaches.  Hematological: Does not bruise/bleed easily.  Psychiatric/Behavioral: Negative for confusion.      Allergies  Review of patient's allergies indicates no known allergies.  Home Medications   Prior to  Admission medications   Medication Sig Start Date End Date Taking? Authorizing Provider  Amino Acids-Protein Hydrolys (FEEDING SUPPLEMENT, PRO-STAT SUGAR FREE 64,) LIQD Take 30 mLs by mouth 2 (two) times daily.    Historical Provider, MD  aspirin 81 MG tablet Take 1 tablet (81 mg total) by mouth daily. 05/24/13   Rhonda G Barrett, PA-C  atorvastatin (LIPITOR) 80 MG tablet Take 1 tablet (80 mg total) by mouth daily at 6 PM. 05/24/13   Rhonda G Barrett, PA-C  B Complex-C (B-COMPLEX WITH VITAMIN C) tablet Take 1 tablet by mouth at bedtime.    Historical Provider, MD  calcium carbonate (TUMS - DOSED IN MG ELEMENTAL CALCIUM) 500 MG chewable tablet Chew 3 tablets by mouth 3 (three) times daily.    Historical Provider, MD  colchicine 0.6 MG tablet Take 0.6 mg by mouth every 12 (twelve) hours as needed (gout).     Historical Provider, MD  dorzolamide-timolol (COSOPT) 22.3-6.8 MG/ML ophthalmic solution Place 1 drop into both eyes 3 (three) times daily.    Historical Provider, MD  escitalopram (LEXAPRO) 10 MG tablet Take 10 mg by mouth daily.    Historical Provider, MD  fentaNYL (DURAGESIC - DOSED MCG/HR) 50 MCG/HR Apply one patch topically every 72 hours for pain. Remove old patch 09/10/14   Margit Hanks, MD  gabapentin (NEURONTIN) 100 MG capsule Take 100 mg by mouth every 12 (twelve) hours as needed (neuropathy).  12/29/13   Historical Provider, MD  latanoprost (XALATAN) 0.005 % ophthalmic solution Place 1 drop into both eyes at bedtime.  04/20/14   Historical Provider, MD  loperamide (IMODIUM) 2 MG capsule Take 4 mg by mouth as needed for diarrhea or loose stools.    Historical Provider, MD  LORazepam (ATIVAN) 0.5 MG tablet Take 1 tablet (0.5 mg total) by mouth every 8 (eight) hours as needed for anxiety. 08/21/14   Leroy Sea, MD  Menthol, Topical Analgesic, 4 % GEL Apply 1 application topically every 6 (six) hours as needed (pain). Apply to shoulders/lower back    Historical Provider, MD   nitroGLYCERIN (NITROSTAT) 0.4 MG SL tablet Place 1 tablet (0.4 mg total) under the tongue every 5 (five) minutes as needed for chest pain. 05/24/13   Rhonda G Barrett, PA-C  omeprazole (PRILOSEC) 40 MG capsule Take 40 mg by mouth at bedtime.  Historical Provider, MD  oxyCODONE (OXY IR/ROXICODONE) 5 MG immediate release tablet Take 1-2 tablets (5-10 mg total) by mouth every 4 (four) hours as needed for moderate pain. 09/10/14   Margit Hanks, MD  prasugrel (EFFIENT) 10 MG TABS tablet Take 1 tablet (10 mg total) by mouth daily. 06/16/13   Chrystie Nose, MD  sodium bicarbonate 650 MG tablet Take 650 mg by mouth 2 (two) times daily.      Historical Provider, MD  sulindac (CLINORIL) 200 MG tablet Take 200 mg by mouth 2 (two) times daily.    Historical Provider, MD  vancomycin (VANCOCIN) 1 GM/200ML SOLN Inject 200 mLs (1,000 mg total) into the vein every dialysis. 09/26/14   Gardiner Barefoot, MD   BP 103/74 mmHg  Pulse 71  Temp(Src) 97.7 F (36.5 C) (Oral)  Resp 18  SpO2 100% Physical Exam  Constitutional: He is oriented to person, place, and time. He appears well-developed and well-nourished. No distress.  HENT:  Mouth/Throat: Oropharynx is clear and moist.  Eyes: Conjunctivae are normal.  Neck: Neck supple. No tracheal deviation present.  Cardiovascular: Normal rate, regular rhythm, normal heart sounds and intact distal pulses.   Pulmonary/Chest: Effort normal and breath sounds normal. No accessory muscle usage. No respiratory distress.  Abdominal: Soft. Bowel sounds are normal. He exhibits no distension. There is no tenderness.  Musculoskeletal: Normal range of motion.  Left leg diffusely swollen to groin, as compared to right. Distal pulse dopplerable. Pressure being held on bleeding graft.   Neurological: He is alert and oriented to person, place, and time.  Skin: Skin is warm and dry. He is not diaphoretic.  Psychiatric: He has a normal mood and affect.  Nursing note and vitals  reviewed.   ED Course  Procedures (including critical care time) Labs Review   Results for orders placed or performed during the hospital encounter of 10/07/14  CBC  Result Value Ref Range   WBC 13.4 (H) 4.0 - 10.5 K/uL   RBC 2.59 (L) 4.22 - 5.81 MIL/uL   Hemoglobin 7.5 (L) 13.0 - 17.0 g/dL   HCT 69.4 (L) 50.3 - 88.8 %   MCV 92.7 78.0 - 100.0 fL   MCH 29.0 26.0 - 34.0 pg   MCHC 31.3 30.0 - 36.0 g/dL   RDW 28.0 (H) 03.4 - 91.7 %   Platelets 391 150 - 400 K/uL  Protime-INR  Result Value Ref Range   Prothrombin Time 15.9 (H) 11.6 - 15.2 seconds   INR 1.26 0.00 - 1.49  APTT  Result Value Ref Range   aPTT 34 24 - 37 seconds  Type and screen  Result Value Ref Range   ABO/RH(D) A POS    Antibody Screen PENDING    Sample Expiration 10/10/2014       MDM   Iv ns. Monitor. Labs.  Pressure held directly on bleeding site.  Reviewed nursing notes and prior charts for additional history.  Pt w apparent v complex situation re his dialyses access.  Noted to have 'last viable site' as left groin graft, and w recently complication including poor flow, grossly swollen left leg, graft infection/degeneratioon, and intermittent problems w bleeding from graft.   Pressure continued to be held.  Vascular surgery on call, Dr Early paged to evaluate in ED.  Reviewed nursing notes and prior charts for additional history.   Dr Early evaluated in ED, and placed sutures at site, hemostasis achieved.   Based on above issues w graft, ?incomplete HD Friday,  and need for definitive solution, he recommends admit to med service, indicating they and renal will consult and assist w plan/care.   Pt states last dialyzed Friday, although states some weeks he gets dialyzed 4x/week due to problems w graft/incomplete runs. No resp distress currently, labs pending.  Discussed pt with Dr Briant Cedar, on call for renal, they will consult and arrange for inpatient dialyses, agrees w med service admit.   Unassigned  medicine paged for admit.  hgb decreased from prior, no active bleeding, bp normal - pt may benefit from transfusion at/prior to next dialyses.   Recheck thigh, no bleeding or any increase in swelling/hematoma, as compared to prior exam.   Chem still pending.  Discussed w [facility] roc who indicates place temp orders, admit to Dr Deirdre Priest.     Cathren Laine, MD 10/07/14 [phone]

## 2014-10-07 NOTE — Progress Notes (Signed)
10/07/2014 5:22 PM  New Admission Note:   Arrival Method: Via stretcher from the ED with EMT Amedeo Gory  Mental Orientation: Alert and oriented X4  Telemetry: Per MD orders over telephone call no tele at this time  Assessment: Completed Skin: Warm, dry and intact. Left thigh swollen where graft is. Patient has new stiches in thigh from ED this admission due to bleed. Tender to touch. No drainage at this time.  IV: Clean, dry and intact. Right wrist NSL Pain: Stated 8 out of 10. Patient stated his thigh, back and hands hurt. Will inform attending RN  Tubes: Left thigh graft. Old left arm grafts that do not work per patient. Stated there are 3 grafts in the left arm  Safety Measures: Safety Fall Prevention Plan has been given, discussed and signed Admission: Completed 6 [place] Orientation: Patient has been orientated to the room, unit and staff.  Family: None at bedside at this time   Orders have been reviewed and implemented. Will continue to monitor the patient. Call light has been placed within reach and bed alarm has been activated.   [employer], RN-BC, RN3 [facility]  Phone number: [phone] Activate Charge Nurse (at this time)

## 2014-10-07 NOTE — ED Notes (Signed)
Pt aide from home advised she would clean pt up.  Told her if she needed our assastance we would be more then happy to help.

## 2014-10-07 NOTE — ED Notes (Addendum)
Tele monitor at bedside.  Awaiting transport.

## 2014-10-07 NOTE — Consult Note (Signed)
[facility] Renal Consultation Note  Indication for Consultation:  Management of ESRD/hemodialysis; anemia, hypertension/volume and secondary hyperparathyroidism  HPI: [name] is a 59 y.o. 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 Arbie Cookey of [facility].  Since replacement of the medial limb of his graft by Dr. Darrick Penna 4/18, secondary to bleeding and degeneration, he reports worsening swelling and pain at the site of the graft and pustular discharge from one area, requiring Vancomycin per Dr. Luciana Axe of Infectious Disease for six weeks.  On 6/9 he was seen by Dr.Fields of [facility] who scheduled left thigh shuntogram with possible intervention for Tuesday 6/14.  He has no other location or option for a viable permanent dialysis access.  Currently his left thigh is significantly swollen with edema throughout his left lower extremity.   Dialysis Orders:   MWF @ AF 3.5 hrs       115 kg      2K/2.25Ca      450/800        Tight Heparin     Left thigh AVG No Vitamin D               Mircera q2wk                  No Venofer  Past Medical History  Diagnosis Date  . Anxiety   . Back pain   . GERD (gastroesophageal reflux disease)   . Peripheral neuropathy   . Arthritis   . Chronic diastolic CHF (congestive heart failure)     a. 04/2013 Echo: EF nl, no rwma, mild LVH.  Marland Kitchen Active smoker   . ESRD on hemodialysis     [name] 4 days per week on M-Tu-Wed and Fri.  Started HD in 1998 and has been on HD initially at [facility], then went to [facility], then in [facility], then to [facility] and now is at [facility] for last 10 years.  Has L thigh AVG.     . Diabetes mellitus     diet controlled  . Hepatitis 2010    pt states  hx of hep B 3 yrs ago  . PONV (postoperative nausea and vomiting)   . Hypertension   . Bell palsy   . Carpal tunnel syndrome     bilateral  . Palpitations   . Headache(784.0)     Hx: of Migraines  . CAD (coronary artery disease)     a. 04/2013 Ant STEMI/PCI: LM nl, LAD 95p (3.0x18 Xience DES), D1/2/3 small, LCX 80ost, RI 20p, RCA 160m CTO (unsuccessful PCI), EF 55%, mod basal inf HK.  . S/P coronary artery stent placement 05/21/2013    Xience Alpine Medtronic conditional 5 @ 1.5 and 3T    Past Surgical History  Procedure Laterality Date  . Total hip arthroplasty    . Thyroidectomy    . Tooth extraction    . Mandible fracture surgery    . Dg av dialysis graft declot or    . Insertion of dialysis catheter  03/28/2011    Procedure: INSERTION OF DIALYSIS CATHETER;  Surgeon: Pryor Ochoa, MD;  Location: [facility] OR;  Service: Vascular;  Laterality: Left;  . Av fistula placement  03/31/2011    Procedure: INSERTION OF ARTERIOVENOUS (AV) GORE-TEX GRAFT THIGH;  Surgeon: Sherren Kerns, MD;  Location: [facility] OR;  Service: Vascular;  Laterality: Right;  redo right thigh arteriovenous gortex graft using gore-tex stretch 55mm x 70cm  . Thrombectomy w/ embolectomy  06/02/2011    Procedure: THROMBECTOMY ARTERIOVENOUS GORE-TEX GRAFT;  Surgeon: Chuck Hint, MD;  Location: [facility] OR;  Service: Vascular;  Laterality: Right;  Thrombectomy right thigh arteriovenous gortex graft;  revision by  replacement of large portion of graft with 64mm gore-tex   . Insertion of dialysis catheter  08/28/2011    Procedure: INSERTION OF DIALYSIS CATHETER;  Surgeon: Fransisco Hertz, MD;  Location: [facility] OR;  Service: Vascular;  Laterality: Left;  Atempted Bilateral Internal Jugular, Bilateral Subclavin insertion of 55cm Dialysis Catheter Left Femoral  . Insertion of dialysis catheter  12/15/2011    Procedure: INSERTION OF DIALYSIS CATHETER;  Surgeon: Sherren Kerns, MD;  Location: [facility] OR;  Service: Vascular;  Laterality: Right;  Insertion  of Right Femoral Dialysis Catheter  . Exchange of a dialysis catheter  02/26/2012    Procedure: EXCHANGE OF A DIALYSIS CATHETER;  Surgeon: Larina Earthly, MD;  Location: [facility] OR;  Service: Vascular;  Laterality: Right;  . Joint replacement    . Exchange of a dialysis catheter  05/18/2012    Procedure: EXCHANGE OF A DIALYSIS CATHETER;  Surgeon: Larina Earthly, MD;  Location: [facility] OR;  Service: Vascular;  Laterality: Right;  right femoral dialysis catheter  . Av fistula placement  05/18/2012    Procedure: INSERTION OF ARTERIOVENOUS (AV) GORE-TEX GRAFT THIGH;  Surgeon: Larina Earthly, MD;  Location: [facility] OR;  Service: Vascular;  Laterality: Left;  using 3mm by 70cm goretex graft  . Thrombectomy w/ embolectomy Left 08/25/2012    Procedure: THROMBECTOMY ARTERIOVENOUS GORE-TEX GRAFT;  Surgeon: Pryor Ochoa, MD;  Location: [facility] OR;  Service: Vascular;  Laterality: Left;  Attempted thrombectomy of left thigh arteriovenous gortex graft.   . Av fistula placement Left 08/25/2012    Procedure: INSERTION OF ARTERIOVENOUS (AV) GORE-TEX GRAFT THIGH;  Surgeon: Pryor Ochoa, MD;  Location: [facility] OR;  Service: Vascular;  Laterality: Left;  Using 62mm x 40cm vascular Gortex graft.  . Revision of arteriovenous goretex graft Left 12/14/2012    Procedure: Revision of Left Thigh Graft;  Surgeon: Larina Earthly, MD;  Location: [facility] OR;  Service: Vascular;  Laterality: Left;  . Avgg removal Left 02/16/2013    Procedure: EXCISION OF LEFT ARM ARTERIOVENOUS GORETEX GRAFT TIMES 2 WITH VEIN PATCH ANGIOPLASTY OF BRACIAL ARTERY.  ;  Surgeon: Chuck Hint, MD;  Location: [facility] OR;  Service: Vascular;  Laterality: Left;  Converted from MAC to General.    . Revision of arteriovenous goretex graft Left 08/08/2013    Procedure: REVISION OF LEFT THIGH ARTERIOVENOUS GORETEX GRAFT;  Surgeon: Sherren Kerns, MD;  Location: [facility] OR;  Service: Vascular;  Laterality: Left;  . Venogram Bilateral 10/27/2011    Procedure: VENOGRAM;  Surgeon: Nada Libman, MD;   Location: [facility] CATH LAB;  Service: Cardiovascular;  Laterality: Bilateral;  bilat upper extrem venograms  . Left heart cath Bilateral 05/21/2013    Procedure: LEFT HEART CATH;  Surgeon: Iran Ouch, MD;  Location: [facility] CATH LAB;  Service: Cardiovascular;  Laterality: Bilateral;  . Percutaneous coronary stent intervention (pci-s)  05/21/2013    Procedure: PERCUTANEOUS CORONARY STENT INTERVENTION (PCI-S);  Surgeon: Iran Ouch, MD;  Location: [facility] CATH LAB;  Service: Cardiovascular;;  .  Coronary angioplasty with stent placement    . Radiology with anesthesia N/A 06/27/2014    Procedure: MRI LUMBER WITHOUT CONTRAST;  Surgeon: Medication Radiologist, MD;  Location: [facility] OR;  Service: Radiology;  Laterality: N/A;  . Revision of arteriovenous goretex graft Left 08/13/2014    Procedure: REVISION OF ARTERIOVENOUS GORETEX GRAFT LEFT THIGH;  Surgeon: Sherren Kerns, MD;  Location: [facility] OR;  Service: Vascular;  Laterality: Left;  . Radiology with anesthesia N/A 08/16/2014    Procedure: MRI;  Surgeon: Medication Radiologist, MD;  Location: [facility] OR;  Service: Radiology;  Laterality: N/A;  . Thrombectomy w/ embolectomy Left 08/22/2014    Procedure: THROMBECTOMY ARTERIOVENOUS GORE-TEX GRAFT Of Left Thigh Graft;  Surgeon: Larina Earthly, MD;  Location: [facility] OR;  Service: Vascular;  Laterality: Left;   Family History  Problem Relation Age of Onset  . Diabetes Mother   . Stroke Mother   . Heart disease Mother   . Kidney disease Father   . Hyperlipidemia Father     reports that he has been smoking Cigarettes.  He has a 8 pack-year smoking history. He has never used smokeless tobacco. He reports that he does not drink alcohol or use illicit drugs. No Known Allergies Prior to Admission medications   Medication Sig Start Date End Date Taking? Authorizing Provider  Amino Acids-Protein Hydrolys (FEEDING SUPPLEMENT, PRO-STAT SUGAR FREE 64,) LIQD Take 30 mLs by mouth 2 (two) times daily.   Yes Historical Provider, MD   aspirin 81 MG tablet Take 1 tablet (81 mg total) by mouth daily. 05/24/13  Yes Rhonda G Barrett, PA-C  atorvastatin (LIPITOR) 80 MG tablet Take 1 tablet (80 mg total) by mouth daily at 6 PM. 05/24/13  Yes Rhonda G Barrett, PA-C  B Complex-C (B-COMPLEX WITH VITAMIN C) tablet Take 1 tablet by mouth at bedtime.   Yes Historical Provider, MD  calcium carbonate (TUMS - DOSED IN MG ELEMENTAL CALCIUM) 500 MG chewable tablet Chew 3 tablets by mouth 3 (three) times daily.   Yes Historical Provider, MD  colchicine 0.6 MG tablet Take 0.6 mg by mouth every 12 (twelve) hours as needed (gout).    Yes Historical Provider, MD  dorzolamide-timolol (COSOPT) 22.3-6.8 MG/ML ophthalmic solution Place 1 drop into both eyes 3 (three) times daily.   Yes Historical Provider, MD  escitalopram (LEXAPRO) 10 MG tablet Take 10 mg by mouth daily.   Yes Historical Provider, MD  fentaNYL (DURAGESIC - DOSED MCG/HR) 50 MCG/HR Apply one patch topically every 72 hours for pain. Remove old patch Patient taking differently: Place 50 mcg onto the skin every 3 (three) days. Apply one patch topically every 72 hours for pain. Remove old patch 09/10/14  Yes Margit Hanks, MD  gabapentin (NEURONTIN) 100 MG capsule Take 100 mg by mouth every 12 (twelve) hours as needed (neuropathy).  12/29/13  Yes Historical Provider, MD  latanoprost (XALATAN) 0.005 % ophthalmic solution Place 1 drop into both eyes at bedtime.  04/20/14  Yes Historical Provider, MD  loperamide (IMODIUM A-D) 2 MG tablet Take 4 mg by mouth every 4 (four) hours as needed for diarrhea or loose stools.   Yes Historical Provider, MD  LORazepam (ATIVAN) 0.5 MG tablet Take 1 tablet (0.5 mg total) by mouth every 8 (eight) hours as needed for anxiety. 08/21/14  Yes Leroy Sea, MD  Menthol, Topical Analgesic, 4 % GEL Apply 1 application topically every 6 (six) hours as needed (pain). Apply to shoulders/lower back   Yes Historical Provider, MD  nitroGLYCERIN (NITROSTAT) 0.4 MG SL tablet  Place 1 tablet (0.4 mg total) under the tongue every 5 (five) minutes as needed for chest pain. 05/24/13  Yes Rhonda G Barrett, PA-C  omeprazole (PRILOSEC) 40 MG capsule Take 40 mg by mouth at bedtime.    Yes Historical Provider, MD  oxyCODONE (OXY IR/ROXICODONE) 5 MG immediate release tablet Take 1-2 tablets (5-10 mg total) by mouth every 4 (four) hours as needed for moderate pain. Patient taking differently: Take 5-10 mg by mouth every 4 (four) hours as needed (1 tablet (5 mg) moderate pain; 2 tablets (10 mg) severe pain).  09/10/14  Yes Margit Hanks, MD  prasugrel (EFFIENT) 10 MG TABS tablet Take 1 tablet (10 mg total) by mouth daily. 06/16/13  Yes Chrystie Nose, MD  sodium bicarbonate 650 MG tablet Take 650 mg by mouth 2 (two) times daily.     Yes Historical Provider, MD  sulindac (CLINORIL) 200 MG tablet Take 200 mg by mouth 2 (two) times daily.   Yes Historical Provider, MD  vancomycin (VANCOCIN) 1 GM/200ML SOLN Inject 200 mLs (1,000 mg total) into the vein every dialysis. 09/26/14   Gardiner Barefoot, MD   Labs:  Results for orders placed or performed during the hospital encounter of 10/07/14 (from the past 48 hour(s))  CBC     Status: Abnormal   Collection Time: 10/07/14  2:44 PM  Result Value Ref Range   WBC 13.4 (H) 4.0 - 10.5 K/uL   RBC 2.59 (L) 4.22 - 5.81 MIL/uL   Hemoglobin 7.5 (L) 13.0 - 17.0 g/dL   HCT 16.1 (L) 09.6 - 04.5 %   MCV 92.7 78.0 - 100.0 fL   MCH 29.0 26.0 - 34.0 pg   MCHC 31.3 30.0 - 36.0 g/dL   RDW 40.9 (H) 81.1 - 91.4 %   Platelets 391 150 - 400 K/uL  Protime-INR     Status: Abnormal   Collection Time: 10/07/14  2:44 PM  Result Value Ref Range   Prothrombin Time 15.9 (H) 11.6 - 15.2 seconds   INR 1.26 0.00 - 1.49  APTT     Status: None   Collection Time: 10/07/14  2:44 PM  Result Value Ref Range   aPTT 34 24 - 37 seconds  Type and screen     Status: None (Preliminary result)   Collection Time: 10/07/14  2:44 PM  Result Value Ref Range   ABO/RH(D) A POS     Antibody Screen PENDING    Sample Expiration 10/10/2014    Constitutional: negative Ears, nose, mouth, throat, and face: negative for earaches, hoarseness, nasal congestion and sore throat Respiratory: negative for cough, dyspnea on exertion, hemoptysis and sputum Cardiovascular: negative for chest pain, chest pressure/discomfort, dyspnea, orthopnea and palpitations Gastrointestinal: negative for abdominal pain, change in bowel habits, nausea and vomiting Genitourinary:negative, anuric Musculoskeletal:positive for arthralgias and back pain, negative for myalgias and neck pain Neurological: positive for gait problems, negative for dizziness, headaches, paresthesia and speech problems  Physical Exam: Filed Vitals:   10/07/14 1409  BP: 103/74  Pulse: 71  Temp: 97.7 F (36.5 C)  Resp: 18     General appearance: alert, cooperative and no distress Head: Normocephalic, without obvious abnormality, atraumatic Neck: no adenopathy, no carotid bruit, no JVD and supple, symmetrical, trachea midline Resp: clear to auscultation bilaterally Cardio: regular rate and rhythm, S1, S2 normal, no murmur, click, rub or gallop GI: soft, non-tender; bowel sounds normal; no masses,  no organomegaly Extremities: L thigh with  significant swelling and 2+ edema distally. Neurologic: Grossly normal Dialysis Access: L thigh AVG with + bruit,ulcer on medial limb that drains small amt pus when squeezed.   Assessment/Plan: 1. Bleeding from L thigh AVG / swelling - s/p revision on 4/18, followed by chronic swelling & pain, on Vancomycin per ID for 6 wks, shuntogram scheduled 6/14, per [facility]. 2. ESRD - HD on MWF @ AF, K 5.2.   HD tomorrow. 3. Hypertension/volume - BP controlled, below EDW. 4. Anemia - Hgb 7.5, transfuse 2 U tomorrow with HD. 5. Metabolic bone disease - s/p parathyroidectomy 6. Nutrition - renal diet, vitamin 7. Chronic back pain - sec to DJD LYLES,CHARLES 10/07/2014, 3:27 PM   Attending  Nephrologist: Primitivo Gauze, MD  I have seen and examined this patient and agree with plan per Gerome Apley.  59yo BM with ESRD comes to ER from [facility] due to bleeding thigh AVG.  AVG has had a lot of problems lately with infection and has had medial portion replaced.  He has a small ulcer on medial limb that is draining small amt of pus.  Lt leg swollen.  Not sure this AVG will  Last much longer but his access options are limited.  Dr Arbie Cookey will evaluate options.  Plan HD tomorrow using AVG and will transfuse as Hg 7.5.  He has been on vanco as outpt and will cont.Marland Kitchen Martesha Niedermeier T,MD 10/07/2014 4:33 PM

## 2014-10-08 ENCOUNTER — Other Ambulatory Visit: Payer: Self-pay | Admitting: *Deleted

## 2014-10-08 DIAGNOSIS — T82838A Hemorrhage of vascular prosthetic devices, implants and grafts, initial encounter: Principal | ICD-10-CM | POA: Insufficient documentation

## 2014-10-08 LAB — CBC
HCT: 17 % — ABNORMAL LOW (ref 39.0–52.0)
HCT: 25.9 % — ABNORMAL LOW (ref 39.0–52.0)
Hemoglobin: 5.5 g/dL — CL (ref 13.0–17.0)
Hemoglobin: 8.4 g/dL — ABNORMAL LOW (ref 13.0–17.0)
MCH: 29.1 pg (ref 26.0–34.0)
MCH: 28.9 pg (ref 26.0–34.0)
MCHC: 32.4 g/dL (ref 30.0–36.0)
MCHC: 32.4 g/dL (ref 30.0–36.0)
MCV: 89.9 fL (ref 78.0–100.0)
MCV: 89 fL (ref 78.0–100.0)
PLATELETS: 285 10*3/uL (ref 150–400)
PLATELETS: 256 10*3/uL (ref 150–400)
RBC: 1.89 MIL/uL — ABNORMAL LOW (ref 4.22–5.81)
RBC: 2.91 MIL/uL — AB (ref 4.22–5.81)
RDW: 16.4 % — ABNORMAL HIGH (ref 11.5–15.5)
RDW: 16.7 % — AB (ref 11.5–15.5)
WBC: 17.9 10*3/uL — ABNORMAL HIGH (ref 4.0–10.5)
WBC: 19.5 10*3/uL — AB (ref 4.0–10.5)

## 2014-10-08 LAB — RENAL FUNCTION PANEL
ALBUMIN: 2.2 g/dL — AB (ref 3.5–5.0)
Anion gap: 12 (ref 5–15)
BUN: 69 mg/dL — AB (ref 6–20)
CO2: 25 mmol/L (ref 22–32)
CREATININE: 9.13 mg/dL — AB (ref 0.61–1.24)
Calcium: 7 mg/dL — ABNORMAL LOW (ref 8.9–10.3)
Chloride: 99 mmol/L — ABNORMAL LOW (ref 101–111)
GFR calc Af Amer: 6 mL/min — ABNORMAL LOW (ref >60)
GFR calc non Af Amer: 6 mL/min — ABNORMAL LOW (ref >60)
Glucose, Bld: 74 mg/dL (ref 65–99)
Phosphorus: 6 mg/dL — ABNORMAL HIGH (ref 2.5–4.6)
Potassium: 5.5 mmol/L — ABNORMAL HIGH (ref 3.5–5.1)
Sodium: 136 mmol/L (ref 135–145)

## 2014-10-08 LAB — BILIRUBIN, TOTAL: Total Bilirubin: 1.1 mg/dL (ref 0.3–1.2)

## 2014-10-08 LAB — ALT: ALT: 19 U/L (ref 17–63)

## 2014-10-08 LAB — GLUCOSE, CAPILLARY: Glucose-Capillary: 78 mg/dL (ref 65–99)

## 2014-10-08 LAB — AST: AST: 58 U/L — ABNORMAL HIGH (ref 15–41)

## 2014-10-08 LAB — PREPARE RBC (CROSSMATCH)

## 2014-10-08 LAB — PROTEIN, TOTAL: Total Protein: 6.5 g/dL (ref 6.5–8.1)

## 2014-10-08 MED ORDER — SODIUM CHLORIDE 0.9 % IJ SOLN: 3.0000 mL | INTRAMUSCULAR | Status: DC | PRN

## 2014-10-08 MED ORDER — PENTAFLUOROPROP-TETRAFLUOROETH EX AERO: 1.0000 "application " | INHALATION_SPRAY | CUTANEOUS | Status: DC | PRN

## 2014-10-08 MED ORDER — LIDOCAINE HCL (PF) 1 % IJ SOLN: 5.0000 mL | INTRAMUSCULAR | Status: DC | PRN

## 2014-10-08 MED ORDER — OXYCODONE HCL 5 MG PO TABS
ORAL_TABLET | ORAL | Status: AC
  Administered 2014-10-08: 10 mg
  Filled 2014-10-08: qty 2

## 2014-10-08 MED ORDER — HEPARIN SODIUM (PORCINE) 1000 UNIT/ML DIALYSIS
20.0000 [IU]/kg | INTRAMUSCULAR | Status: DC | PRN
  Filled 2014-10-08: qty 3

## 2014-10-08 MED ORDER — LIDOCAINE-PRILOCAINE 2.5-2.5 % EX CREA: 1.0000 "application " | TOPICAL_CREAM | CUTANEOUS | Status: DC | PRN

## 2014-10-08 MED ORDER — SODIUM CHLORIDE 0.9 % IV SOLN: 100.0000 mL | INTRAVENOUS | Status: DC | PRN

## 2014-10-08 MED ORDER — HEPARIN SODIUM (PORCINE) 1000 UNIT/ML DIALYSIS
1000.0000 [IU] | INTRAMUSCULAR | Status: DC | PRN
  Filled 2014-10-08: qty 1

## 2014-10-08 MED ORDER — LORAZEPAM 0.5 MG PO TABS
ORAL_TABLET | ORAL | Status: AC
  Filled 2014-10-08: qty 1

## 2014-10-08 MED ORDER — ALTEPLASE 2 MG IJ SOLR
2.0000 mg | Freq: Once | INTRAMUSCULAR | Status: DC | PRN
  Filled 2014-10-08: qty 2

## 2014-10-08 MED ORDER — SODIUM CHLORIDE 0.9 % IV SOLN
125.0000 mg | INTRAVENOUS | Status: DC
  Administered 2014-10-10: 125 mg via INTRAVENOUS
  Filled 2014-10-08 (×2): qty 10

## 2014-10-08 MED ORDER — NEPRO/CARBSTEADY PO LIQD: 237.0000 mL | ORAL | Status: DC | PRN

## 2014-10-08 MED ORDER — FENTANYL 50 MCG/HR TD PT72: MEDICATED_PATCH | TRANSDERMAL | Status: AC

## 2014-10-08 MED ORDER — DARBEPOETIN ALFA 100 MCG/0.5ML IJ SOSY: 100.0000 ug | PREFILLED_SYRINGE | INTRAMUSCULAR | Status: DC

## 2014-10-08 MED ORDER — DARBEPOETIN ALFA 100 MCG/0.5ML IJ SOSY
100.0000 ug | PREFILLED_SYRINGE | INTRAMUSCULAR | Status: DC
  Filled 2014-10-08: qty 0.5

## 2014-10-08 NOTE — Progress Notes (Signed)
Pt upset being verbally abusive to staff last night,pt threw his ice tea across the room because he wanted pain med,explained to pt that MD has kept him on his home dose. Pt next dose not due until 2200,security called and came up to talk to the patient will continue to monitor

## 2014-10-08 NOTE — Progress Notes (Signed)
Vancomycin not given in HD due to not received form pharmacy after notifications. Pt received HD and 2uprbc with no complications. Report called to primary RN. Pt alert, vss. Returned safely to room.

## 2014-10-08 NOTE — Care Management Note (Signed)
Case Management Note  Patient Details  Name: [name] MRN: [id number] Date of Birth: 11-Aug-1955  Subjective/Objective:            CM following for progression and d/c planning        Action/Plan: Not consult for pt return to SNF, CSW notified of pt concerns re SNF. Alvina Chou CSW will work with pt on this issue and return to SNF.   Expected Discharge Date:       10/09/2014           Expected Discharge Plan:  Skilled Nursing Facility  In-House Referral:  Clinical Social Work  Discharge planning Services  NA  Post Acute Care Choice:  NA Choice offered to:  NA  DME Arranged:    DME Agency:     HH Arranged:    HH Agency:     Status of Service:  In process, will continue to follow  Medicare Important Message Given:    Date Medicare IM Given:    Medicare IM give by:    Date Additional Medicare IM Given:    Additional Medicare Important Message give by:     If discussed at Long Length of Stay Meetings, dates discussed:    Additional Comments:  Starlyn Skeans, RN 10/08/2014, 4:22 PM

## 2014-10-08 NOTE — Progress Notes (Signed)
Pt ordered a CBC post blood transfusion. Lab unable to get blood from patient d/t difficult stick. Patient refused further attempts. CBC and BMP in 6/14. Will continue to monitor. Bess Kinds, RN

## 2014-10-08 NOTE — Telephone Encounter (Signed)
[facility] 

## 2014-10-08 NOTE — Progress Notes (Signed)
CRITICAL VALUE ALERT  Critical value received: HGB  Date of notification:  10/08/14  Time of notification:  0930  Critical value read backy  Nurse who received alert:  Haynes Dage  MD notified (1st page):  Audree Bane  Time of first page:  0930   MD notified (2nd page):  Time of second page:  Responding MD: Audree Bane   Time MD responded: 0930

## 2014-10-08 NOTE — Progress Notes (Addendum)
[facility] Progress Note  Assessment/Plan: 1. Left thigh graft/venous replaced 07/2014 with early reclot and subsequent thrombectomy , bleeding issues caused presentation yesterday, , small purulent drainage on arterial limb - for planned shuntogram tomorrow -is on a 6 week course of empiric Vanc 2. ESRD -MWF -  3. Anemia - Hgb 7.5 6/12 called with critical Hgb of 5.5- plan transfuse 2 units on HD- need to find out what ESA dose is when [facility] available, dose Aranesp 100 Wed, repeat CBC this afternoon and again in am.- add IV Fe- 125 x 5 4. Secondary hyperparathyroidism - not on vit D, s/p PTX-on tums 3 ac 5. HTN/volume - BP ok, most swelling in the left leg -but doesn't seem to be a hematoma 6. Nutrition - renal diet/ vit 7. 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 CAD hx stent 9. Bilateral CTS 10. Chronic back pain/ debility 11. Morbid obesity- 12. Disp - to return to [place] [facility]  Sheffield Slider, PA-C [facility] Beeper [phone] 10/08/2014,9:12 AM  LOS: 1 day   Pt seen, examined and agree w A/P as above.  Vinson Moselle MD pager [phone]    cell [phone] 10/08/2014, 9:51 AM    Subjective:   C/o bilateral hand pain due to carpal tunnel  Objective Filed Vitals:   10/07/14 1945 10/08/14 0528 10/08/14 0848 10/08/14 0853  BP: 116/30 124/65 119/41 107/49  Pulse: 78 82 84 80  Temp: 98 F (36.7 C) 98.8 F (37.1 C) 97.4 F (36.3 C)   TempSrc: Oral Oral Oral   Resp:   16   Height:      Weight:   115.4 kg (254 lb 6.6 oz)   SpO2: 100% 100% 99%    Physical Exam General: uncomfortable breathing easily on room air Heart: RRR Lungs: no rales Abdomen: obese soft Extremities: left LE edema 1+ Dialysis Access: left thigh AVGG - cannulating on venous side   Dialysis Orders: MWF REVISED 4 hrs 115 kg 2K/3.5Ca 450/800profile 4  Heparin8000 Left thigh AVG No Vitamin D Mircera q2wk- 75 - last  given 6/8 Hgb 9.1  Venofer none tsat 27% ferritin 132  before acute blood loss On IV Vanc 1 gm through 7/27  Additional Objective Labs: Basic Metabolic Panel:  Recent Labs Lab 10/07/14 1444  NA 136  K 5.2*  CL 97*  CO2 25  GLUCOSE 74  BUN 57*  CREATININE 8.42*  CALCIUM 7.4*   Liver Function Tests:  Recent Labs Lab 10/07/14 1444  AST 60*  ALT 19  ALKPHOS 181*  BILITOT 1.2  PROT 6.5  ALBUMIN 2.2*   CBC:  Recent Labs Lab 10/07/14 1444  WBC 13.4*  HGB 7.5*  HCT 24.0*  MCV 92.7  PLT 391  CBG:  Recent Labs Lab 10/07/14 1706 10/07/14 2001 10/08/14 0743  GLUCAP 80 87 78  Medications:   . sodium chloride   Intravenous Once  . aspirin EC  81 mg Oral Daily  . atorvastatin  80 mg Oral q1800  . B-complex with vitamin C  1 tablet Oral QHS  . calcium carbonate  3 tablet Oral TID  . dorzolamide-timolol  1 drop Both Eyes TID  . escitalopram  10 mg Oral Daily  . feeding supplement (PRO-STAT SUGAR FREE 64)  30 mL Oral BID  . fentaNYL  50 mcg Transdermal Q72H  . latanoprost  1 drop Both Eyes QHS  . LORazepam      . pantoprazole  40 mg Oral Daily  . sodium bicarbonate  650 mg Oral BID  . vancomycin  1,000 mg Intravenous Q M,W,F-HD

## 2014-10-08 NOTE — Progress Notes (Signed)
Family Medicine Teaching Service Daily Progress Note Intern Pager: [phone]  Patient name: [name] Medical record number: [id number] Date of birth: 1956-02-07 Age: 59 y.o. Gender: male  Primary Care Provider: Dyke Maes, MD Consultants: vascular surgery, nephrology Code Status: Full  Pt Overview and Major Events to Date:  06/13: Admitted  Assessment and Plan: [name] is a 59 y.o. male presenting with bleeding at AV graft site. PMH is significant for HTN, Diabetes mellutis type 2, ESRD, CAD s/p stents x2, Chronic pain secondary to degenerative disc disease  #Bleeding from AV graft: unfortunately this is the last site available for a graft. Per chart review, vascular has been trying to preserve this graft with the help of infectious disease and nephrology. Plan outpatient was for a shuntogram this upcoming week. Currently, bleeding has stopped and vitals are stable. Hemoglobin dropped from baseline of 9-10 to 7.5.  - follow vascular recommendations - hold Effient, OK to continue aspirin, SCDs per vascular - will be transfused this am in HD (hgb 5.5 this am)  #Infection of AV graft: follows ID outpatient. Has been on antibiotics since April 2016. Initially on Ceftaz with plans for a 6 week course and subsequent switch to 3-6 months of Augmentin for suppressive therapy.; After visit about one week ago, plan revised secondary to purulent discharge. Currently on a 6-8 week course of IV vancomycin. Patient is afebrile and without a SIRS picture. Received one dose of vancomycin in the ED, although is supposed to obtain with dialysis - vancomycin per pharmacy - fever curve - blood cultures x2 if develops a fever  #Normocytic, normochromic anemia; acute on chronic: most likely from acute blood loss. Hemoglobin drop to 7.5 with a baseline of 9-10. Also with history of CAD. - transfuse during dialysis per nephrology  #ESRD on dialysis (MWF, sometimes Tuesday): last dialysis  session last Friday. See's [facility] outpatient and obtains dialysis at [facility] - nephrology consulted in ED; will follow recommendations - continue home sodium bicarbonate  BID - continue Tums - Cmet in AM  #Chronic pain - hold gabapentin, sulindac - continue fentanyl patch - decrease home oxycodone to 5-10mg  q6hrs  #DM2: not on medication outpatient. Last A1C of 5.4 in April of 2016 - CBG daily  #CAD s/p stents - continue home aspirin - continue home atorvastatin - hold Effient per vascular surgery  #Depression: stable - continue Lexapro  FEN/GI: Renal diet, SLIV Prophylaxis: SCD, right leg  Disposition: Discharge pending vascular access evaluation/stabilization  Subjective:  Patient reporting chronic pain this morning.  He denies any further bleeding from HD site, dizziness, SOB, CP, fevers or chills.  Denies any other concerns at this time.  Objective: Temp:  [97.4 F (36.3 C)-98.9 F (37.2 C)] 98.9 F (37.2 C) (06/13 1403) Pulse Rate:  [75-88] 86 (06/13 1403) Resp:  [11-21] 16 (06/13 1403) BP: (102-138)/(30-68) 123/60 mmHg (06/13 1403) SpO2:  [99 %-100 %] 100 % (06/13 1403) Weight:  [246 lb 7.6 oz (111.8 kg)-254 lb 6.6 oz (115.4 kg)] 246 lb 7.6 oz (111.8 kg) (06/13 1250) Physical Exam: General: awake, some what drowsy, in HD, NAD Cardiovascular: RRR, no murmurs Respiratory: CTAB, no increased WOB, no wheeze Abdomen: obese, soft, NT/ND Extremities: L femoral cath site, HD in process, +1 pitting edema of b/l LE  Laboratory:  Recent Labs Lab 10/07/14 1444 10/08/14 0900  WBC 13.4* 17.9*  HGB 7.5* 5.5*  HCT 24.0* 17.0*  PLT 391 285    Recent Labs Lab 10/07/14  1444 10/08/14 0913 10/08/14 1423  NA 136 136  --   K 5.2* 5.5*  --   CL 97* 99*  --   CO2 25 25  --   BUN 57* 69*  --   CREATININE 8.42* 9.13*  --   CALCIUM 7.4* 7.0*  --   PROT 6.5  --  6.5  BILITOT 1.2  --  1.1  ALKPHOS 181*  --   --   ALT 19  --  19  AST 60*  --   58*  GLUCOSE 74 74  --    Imaging/Diagnostic Tests: No results found.  Raliegh Ip, DO 10/08/2014, 3:21 PM PGY-1, [facility] FPTS Intern pager: [phone], text pages welcome

## 2014-10-08 NOTE — Consult Note (Signed)
[facility]  Subjective  - wants out of bed   Objective 125/55 86 98.9 F (37.2 C) (Oral) 17 100%  Intake/Output Summary (Last 24 hours) at 10/08/14 1813 Last data filed at 10/08/14 1501  Gross per 24 hour  Intake   1110 ml  Output   2980 ml  Net  -1870 ml   Left thigh graft edema unchanged, no bleeding  Assessment/Planning: Left thigh shuntogram +/- intervention tomorrow by my partner Dr Brabham  [name] [name] 10/08/2014 6:13 PM --  Laboratory Lab Results:  Recent Labs  10/07/14 1444 10/08/14 0900  WBC 13.4* 17.9*  HGB 7.5* 5.5*  HCT 24.0* 17.0*  PLT 391 285   BMET  Recent Labs  10/07/14 1444 10/08/14 0913  NA 136 136  K 5.2* 5.5*  CL 97* 99*  CO2 25 25  GLUCOSE 74 74  BUN 57* 69*  CREATININE 8.42* 9.13*  CALCIUM 7.4* 7.0*    COAG Lab Results  Component Value Date   INR 1.26 10/07/2014   INR 1.10 09/26/2014   INR 1.06 08/22/2014   No results found for: PTT      

## 2014-10-09 ENCOUNTER — Ambulatory Visit (HOSPITAL_COMMUNITY): Admission: RE | Admit: 2014-10-09 | Payer: Medicare Other | Source: Ambulatory Visit | Admitting: Surgery

## 2014-10-09 ENCOUNTER — Encounter (HOSPITAL_COMMUNITY)
Admission: EM | Disposition: A | Discharge status: Skilled Nursing Facility | Payer: Medicare Other | Source: Home / Self Care | Attending: Family Medicine

## 2014-10-09 DIAGNOSIS — N186 End stage renal disease: Secondary | ICD-10-CM

## 2014-10-09 DIAGNOSIS — Z955 Presence of coronary angioplasty implant and graft: Secondary | ICD-10-CM

## 2014-10-09 DIAGNOSIS — I5022 Chronic systolic (congestive) heart failure: Secondary | ICD-10-CM

## 2014-10-09 DIAGNOSIS — D649 Anemia, unspecified: Secondary | ICD-10-CM

## 2014-10-09 DIAGNOSIS — D6489 Other specified anemias: Secondary | ICD-10-CM | POA: Insufficient documentation

## 2014-10-09 DIAGNOSIS — I2510 Atherosclerotic heart disease of native coronary artery without angina pectoris: Secondary | ICD-10-CM

## 2014-10-09 DIAGNOSIS — T82838A Hemorrhage of vascular prosthetic devices, implants and grafts, initial encounter: Secondary | ICD-10-CM

## 2014-10-09 DIAGNOSIS — I10 Essential (primary) hypertension: Secondary | ICD-10-CM

## 2014-10-09 DIAGNOSIS — T82898A Other specified complication of vascular prosthetic devices, implants and grafts, initial encounter: Secondary | ICD-10-CM

## 2014-10-09 DIAGNOSIS — T82838D Hemorrhage of vascular prosthetic devices, implants and grafts, subsequent encounter: Secondary | ICD-10-CM

## 2014-10-09 DIAGNOSIS — Z992 Dependence on renal dialysis: Secondary | ICD-10-CM

## 2014-10-09 DIAGNOSIS — T829XXA Unspecified complication of cardiac and vascular prosthetic device, implant and graft, initial encounter: Secondary | ICD-10-CM | POA: Insufficient documentation

## 2014-10-09 DIAGNOSIS — I5032 Chronic diastolic (congestive) heart failure: Secondary | ICD-10-CM

## 2014-10-09 HISTORY — PX: PERIPHERAL VASCULAR CATHETERIZATION: SHX172C

## 2014-10-09 LAB — BASIC METABOLIC PANEL
Anion gap: 13 (ref 5–15)
BUN: 33 mg/dL — ABNORMAL HIGH (ref 6–20)
CO2: 28 mmol/L (ref 22–32)
Calcium: 7.5 mg/dL — ABNORMAL LOW (ref 8.9–10.3)
Chloride: 95 mmol/L — ABNORMAL LOW (ref 101–111)
Creatinine, Ser: 5.62 mg/dL — ABNORMAL HIGH (ref 0.61–1.24)
GFR calc Af Amer: 12 mL/min — ABNORMAL LOW (ref >60)
GFR calc non Af Amer: 10 mL/min — ABNORMAL LOW (ref >60)
Glucose, Bld: 83 mg/dL (ref 65–99)
Potassium: 4 mmol/L (ref 3.5–5.1)
Sodium: 136 mmol/L (ref 135–145)

## 2014-10-09 LAB — TYPE AND SCREEN
ABO/RH(D): A POS
ANTIBODY SCREEN: NEGATIVE
UNIT DIVISION: 0
Unit division: 0

## 2014-10-09 LAB — CBC
HCT: 25.5 % — ABNORMAL LOW (ref 39.0–52.0)
Hemoglobin: 8.3 g/dL — ABNORMAL LOW (ref 13.0–17.0)
MCH: 29.2 pg (ref 26.0–34.0)
MCHC: 32.5 g/dL (ref 30.0–36.0)
MCV: 89.8 fL (ref 78.0–100.0)
Platelets: 245 10*3/uL (ref 150–400)
RBC: 2.84 MIL/uL — ABNORMAL LOW (ref 4.22–5.81)
RDW: 16.7 % — ABNORMAL HIGH (ref 11.5–15.5)
WBC: 17.4 10*3/uL — ABNORMAL HIGH (ref 4.0–10.5)

## 2014-10-09 SURGERY — A/V SHUNTOGRAM/FISTULAGRAM
Anesthesia: LOCAL

## 2014-10-09 MED ORDER — MIDAZOLAM HCL 2 MG/2ML IJ SOLN
INTRAMUSCULAR | Status: DC | PRN
  Administered 2014-10-09 (×3): 1 mg via INTRAVENOUS

## 2014-10-09 MED ORDER — METOPROLOL TARTRATE 1 MG/ML IV SOLN
2.0000 mg | INTRAVENOUS | Status: DC | PRN
  Filled 2014-10-09: qty 5

## 2014-10-09 MED ORDER — GABAPENTIN 100 MG PO CAPS
100.0000 mg | ORAL_CAPSULE | Freq: Three times a day (TID) | ORAL | Status: DC
  Administered 2014-10-09 – 2014-10-10 (×3): 100 mg via ORAL
  Filled 2014-10-09 (×5): qty 1

## 2014-10-09 MED ORDER — ACETAMINOPHEN 325 MG PO TABS: 325.0000 mg | ORAL_TABLET | ORAL | Status: DC | PRN

## 2014-10-09 MED ORDER — ALUM & MAG HYDROXIDE-SIMETH 200-200-20 MG/5ML PO SUSP: 15.0000 mL | ORAL | Status: DC | PRN

## 2014-10-09 MED ORDER — ACETAMINOPHEN 650 MG RE SUPP: 325.0000 mg | RECTAL | Status: DC | PRN

## 2014-10-09 MED ORDER — FENTANYL CITRATE (PF) 100 MCG/2ML IJ SOLN
INTRAMUSCULAR | Status: DC | PRN
  Administered 2014-10-09 (×5): 25 ug via INTRAVENOUS

## 2014-10-09 MED ORDER — ONDANSETRON HCL 4 MG/2ML IJ SOLN: 4.0000 mg | Freq: Four times a day (QID) | INTRAMUSCULAR | Status: DC | PRN

## 2014-10-09 MED ORDER — DOCUSATE SODIUM 100 MG PO CAPS
100.0000 mg | ORAL_CAPSULE | Freq: Every day | ORAL | Status: DC
  Administered 2014-10-10: 100 mg via ORAL
  Filled 2014-10-09: qty 1

## 2014-10-09 MED ORDER — IODIXANOL 320 MG/ML IV SOLN
INTRAVENOUS | Status: DC | PRN
  Administered 2014-10-09: 60 mL via INTRAVENOUS

## 2014-10-09 SURGICAL SUPPLY — 16 items
BAG SNAP BAND KOVER 36X36 (MISCELLANEOUS) ×2 IMPLANT
BALLN MUSTANG 8.0X40 75 (BALLOONS) ×2
BALLOON MUSTANG 8.0X40 75 (BALLOONS) ×1 IMPLANT
COVER DOME SNAP 22 D (MISCELLANEOUS) ×2 IMPLANT
COVER PRB 48X5XTLSCP FOLD TPE (BAG) ×1 IMPLANT
COVER PROBE 5X48 (BAG) ×1
HOVERMATT SINGLE USE (MISCELLANEOUS) ×2 IMPLANT
KIT ENCORE 26 ADVANTAGE (KITS) ×2 IMPLANT
KIT MICROINTRODUCER STIFF 5F (SHEATH) ×2 IMPLANT
SHEATH PINNACLE R/O II 6F 4CM (SHEATH) ×2 IMPLANT
SHIELD RADPAD SCOOP 12X17 (MISCELLANEOUS) ×2 IMPLANT
STOPCOCK MORSE 400PSI 3WAY (MISCELLANEOUS) ×2 IMPLANT
TRAY PV CATH (CUSTOM PROCEDURE TRAY) ×2 IMPLANT
TUBING CIL FLEX 10 FLL-RA (TUBING) ×2 IMPLANT
WIRE BENTSON .035X145CM (WIRE) ×2 IMPLANT
WIRE HITORQ VERSACORE ST 145CM (WIRE) ×2 IMPLANT

## 2014-10-09 NOTE — Progress Notes (Signed)
Patient returned from cath lab. Left thigh graft with sutures and dry dressing intact. Vitals stable. No s/s distress. Will continue to monitor.  Leanna Battles, RN.

## 2014-10-09 NOTE — Progress Notes (Addendum)
Subjective:   No complaints, sitting in chair.   Objective Filed Vitals:   10/08/14 1748 10/08/14 2141 10/09/14 0540 10/09/14 0925  BP: 125/55 124/58 111/47 146/74  Pulse:  90 79 78  Temp:  99.9 F (37.7 C) 98.6 F (37 C) 98.1 F (36.7 C)  TempSrc:  Oral Oral Oral  Resp: 17 18 17 18   Height:      Weight:  111.812 kg (246 lb 8 oz)    SpO2: 100% 97% 100% 100%   Physical Exam General: alert and oriented, no acute distress. Family visiting.  Heart: RRR Lungs: CTA, unlabored. Dim bases.  Abdomen: obese, soft, nontender +BS Extremities: RLE no edema. +1 LLE edema  Dialysis Access:  L thigh AVG +b.t no bleeding noted.   Dialysis Orders: MWF REVISED 4 hrs 115 kg 2K/3.5Ca 450/800profile 4 Heparin8000 Left thigh AVG No Vitamin D Mircera q2wk- 75 - last given 6/8 Hgb 9.1  Venofer none tsat 27% ferritin 132 before acute blood loss On IV Vanc 1 gm through 7/27  Assessment/Plan: 1. 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 For planned shuntogram +/- revision today per [facility]-is on a 6 week course of empiric Vanc. No bleeding post HD yesterday 2. ESRD -MWF - next HD pending tomorrow.  3. Anemia - Hgb 8.3 up from 5.5 on 6/13 transfused 2 units on HD 6/13-  dose Aranesp 100 Wed- add IV Fe- 125 x 5 4. Sec HPTH - corrected Ca+ 8.9/ phos 6 not on vit D, s/p PTX-on tums 3 ac 5. HTN/volume - 146/74, most swelling in the left leg -but doesn't seem to be a hematoma/chronic per pt.- under edw per wts here.  6. Nutrition -NPO for procedure then renal diet/ vit 7. 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 CAD hx stent 9. Bilateral CTS 10. Chronic back pain/ debility 11. Morbid obesity- 12. Disp - to return to [facility]  Jetty Duhamel, NP [facility] [phone] 10/09/2014,9:34 AM  LOS: 2 days   Pt  seen, examined and agree w A/P as above.  Vinson Moselle MD pager [phone]    cell [phone] 10/09/2014, 1:16 PM    Additional Objective Labs: Basic Metabolic Panel:  Recent Labs Lab 10/07/14 1444 10/08/14 0913 10/09/14 0520  NA 136 136 136  K 5.2* 5.5* 4.0  CL 97* 99* 95*  CO2 25 25 28   GLUCOSE 74 74 83  BUN 57* 69* 33*  CREATININE 8.42* 9.13* 5.62*  CALCIUM 7.4* 7.0* 7.5*  PHOS  --  6.0*  --    Liver Function Tests:  Recent Labs Lab 10/07/14 1444 10/08/14 0913 10/08/14 1423  AST 60*  --  58*  ALT 19  --  19  ALKPHOS 181*  --   --   BILITOT 1.2  --  1.1  PROT 6.5  --  6.5  ALBUMIN 2.2* 2.2*  --    No results for input(s): LIPASE, AMYLASE in the last 168 hours. CBC:  Recent Labs Lab 10/07/14 1444 10/08/14 0900 10/08/14 2208 10/09/14 0520  WBC 13.4* 17.9* 19.5* 17.4*  HGB 7.5* 5.5* 8.4* 8.3*  HCT 24.0* 17.0* 25.9* 25.5*  MCV 92.7 89.9 89.0 89.8  PLT 391 285 256 245   Blood Culture    Component Value Date/Time   SDES HEMODIALYSIS GRAFT 08/19/2014 1020   SPECREQUEST Normal 08/19/2014 1020  CULT  08/19/2014 1020    NO GROWTH 2 DAYS Performed at [facility]    REPTSTATUS 08/22/2014 FINAL 08/19/2014 1020    Cardiac Enzymes: No results for input(s): CKTOTAL, CKMB, CKMBINDEX, TROPONINI in the last 168 hours. CBG:  Recent Labs Lab 10/07/14 1706 10/07/14 2001 10/08/14 0743  GLUCAP 80 87 78   Iron Studies: No results for input(s): IRON, TIBC, TRANSFERRIN, FERRITIN in the last 72 hours. @ Studies/Results: No results found. Medications:   . aspirin EC  81 mg Oral Daily  . atorvastatin  80 mg Oral q1800  . B-complex with vitamin C  1 tablet Oral QHS  . calcium carbonate  3 tablet Oral TID  . [START ON 10/10/2014] darbepoetin (ARANESP) injection - DIALYSIS  100 mcg Intravenous Q Wed-HD  . dorzolamide-timolol  1 drop Both Eyes TID  . escitalopram  10 mg Oral Daily  . feeding supplement (PRO-STAT SUGAR FREE 64)  30 mL Oral  BID  . fentaNYL  50 mcg Transdermal Q72H  . [START ON 10/10/2014] ferric gluconate (FERRLECIT/NULECIT) IV  125 mg Intravenous Q M,W,F-HD  . latanoprost  1 drop Both Eyes QHS  . pantoprazole  40 mg Oral Daily  . sodium bicarbonate  650 mg Oral BID  . vancomycin  1,000 mg Intravenous Q M,W,F-HD

## 2014-10-09 NOTE — Progress Notes (Signed)
Family Medicine Teaching Service Daily Progress Note Intern Pager: [phone]  Patient name: [name] Medical record number: [id number] Date of birth: 12/27/1955 Age: 59 y.o. Gender: male  Primary Care Provider: Dyke Maes, MD Consultants: vascular surgery, nephrology Code Status: Full  Pt Overview and Major Events to Date:  06/13: Admitted  Assessment and Plan: [name] is a 59 y.o. male presenting with bleeding at AV graft site. PMH is significant for HTN, Diabetes mellutis type 2, ESRD, CAD s/p stents x2, Chronic pain secondary to degenerative disc disease  #Bleeding from AV graft: unfortunately this is the last site available for a graft. Per chart review, vascular has been trying to preserve this graft with the help of infectious disease and nephrology. Plan outpatient was for a shuntogram this upcoming week. Currently, bleeding has stopped and vitals are stable. Hemoglobin dropped from baseline of 9-10 to 7.5.  - follow vascular recommendations: shuntogram today - Cardiology: hold Effient, OK to continue aspirin, SCDs per vascular. Consider change to plavix. - s/p 2u pRBCs in HD 6/13. Hgb 8.4>8.3  #Infection of AV graft: follows ID outpatient. Has been on antibiotics since April 2016. Initially on Ceftaz with plans for a 6 week course and subsequent switch to 3-6 months of Augmentin for suppressive therapy.; After visit about one week ago, plan revised secondary to purulent discharge. Currently on a 6-8 week course of IV vancomycin. Patient is afebrile and without a SIRS picture. Received one dose of vancomycin in the ED, although is supposed to obtain with dialysis. WBC 19.5>17.4 - vancomycin per pharmacy - fever curve, afebrile - blood cultures x2 if develops a fever  #Normocytic, normochromic anemia; acute on chronic: most likely from acute blood loss. Hemoglobin drop to 7.5 with a baseline of 9-10. Also with history of CAD. Hgb 8.4>8.3 - transfuse during dialysis  per nephrology  #ESRD on dialysis (MWF, sometimes Tuesday): last dialysis session last Friday. See's [facility] outpatient and obtains dialysis at [facility] - nephrology consulted in ED; will follow recommendations - continue home sodium bicarbonate 650mg  BID - continue Tums - Cmet in AM  #Chronic pain - hold gabapentin, sulindac - continue fentanyl patch - decrease home oxycodone to 5-10mg  q6hrs  #DM2: not on medication outpatient. Last A1C of 5.4 in April of 2016 - CBG daily  #CAD s/p stents - continue home aspirin - continue home atorvastatin - hold Effient per vascular surgery  #Depression: stable - continue Lexapro  FEN/GI: Renal diet, SLIV Prophylaxis: SCD, right leg  Disposition: Discharge pending vascular access evaluation/stabilization  Subjective:  Patient reporting chronic low back pain that has improved since last night.  He denies any further bleeding from HD site, dizziness, SOB, CP, fevers or chills.  Understands plan for today.  Objective: Temp:  [97.4 F (36.3 C)-99.9 F (37.7 C)] 98.6 F (37 C) (06/14 0540) Pulse Rate:  [79-90] 79 (06/14 0540) Resp:  [11-21] 17 (06/14 0540) BP: (102-138)/(41-68) 111/47 mmHg (06/14 0540) SpO2:  [97 %-100 %] 100 % (06/14 0540) Weight:  [246 lb 7.6 oz (111.8 kg)-254 lb 6.6 oz (115.4 kg)] 246 lb 8 oz (111.812 kg) (06/13 2141) Physical Exam: General: awake, alert, NAD, wife at bedside Cardiovascular: RRR, no murmurs Respiratory: CTAB, no increased WOB, no wheeze Abdomen: obese, soft, NT/ND Extremities: L femoral cath site no active bleeding but dried blood on bandage, +1 pitting edema of b/l LE  Laboratory:  Recent Labs Lab 10/08/14 0900 10/08/14 2208 10/09/14 0520  WBC 17.9* 19.5* 17.4*  HGB 5.5* 8.4* 8.3*  HCT 17.0* 25.9* 25.5*  PLT 285 256 245    Recent Labs Lab 10/07/14 1444 10/08/14 0913 10/08/14 1423 10/09/14 0520  NA 136 136  --  136  K 5.2* 5.5*  --  4.0  CL 97* 99*  --  95*   CO2 25 25  --  28  BUN 57* 69*  --  33*  CREATININE 8.42* 9.13*  --  5.62*  CALCIUM 7.4* 7.0*  --  7.5*  PROT 6.5  --  6.5  --   BILITOT 1.2  --  1.1  --   ALKPHOS 181*  --   --   --   ALT 19  --  19  --   AST 60*  --  58*  --   GLUCOSE 74 74  --  83   Imaging/Diagnostic Tests: No results found.  Raliegh Ip, DO 10/09/2014, 8:35 AM PGY-1, [place] [facility] FPTS Intern pager: [phone], text pages welcome

## 2014-10-09 NOTE — Interval H&P Note (Signed)
History and Physical Interval Note:  10/09/2014 2:29 PM  [name]  has presented today for surgery, with the diagnosis of end stage renal, pain/sweeling of left thigh  The various methods of treatment have been discussed with the patient and family. After consideration of risks, benefits and other options for treatment, the patient has consented to  Procedure(s): Shuntogram (N/A) as a surgical intervention .  The patient's history has been reviewed, patient examined, no change in status, stable for surgery.  I have reviewed the patient's chart and labs.  Questions were answered to the patient's satisfaction.     Durene Cal

## 2014-10-09 NOTE — Consult Note (Signed)
Admit date: 10/07/2014 Referring Physician  Dr. Deirdre Priest Primary Physician  Dr. Briant Cedar Primary Cardiologist  Dr. Rennis Golden Reason for Consultation  CAD  HPI: This is a 59yo male with a history of ASCAD with STEMI of the inferior wall 04/2013 with cath showing 3 vessel ASCAD and underwent DES to the LAD and unsuccessful PCI of occluded RCA. There was a high grade stenosis in the LCX that was treated medically.  He has normal LVF.  He also had ESRD on HD, HTN, dyslipidemia, Type II DM and chronic anemia.  He was admitted yesterday with bleeding form the AV graft site.  He recently had a revision and was pulling off his dressing and the scab came off resulting in excessive bleeding from that area.  The ulceration was stitched by Vascular surgery in the ER.  He was admitted for shuntogram and monitoring.  Patient has been on dialysis for the past 18 years. He has had multiple grafts with failure in both upper extremities and right lower extremity. He has had multiple issues with current graft located in left thigh, including revisions, infection and bleeding. unfortunately this is the last site available for a graft. Per chart review, vascular has been trying to preserve this graft with the help of infectious disease and nephrology.  Hbg dropped to 5.5.  Currently Effient is on hold and he remains on ASA.  He was transfused 2 units of PRBCs and Hbg now 8.3.  Cardiology is now asked to evaluate for guidance on further DAPT therapy.  He denies any chest pain or SOB recently.      PMH:   Past Medical History  Diagnosis Date  . Anxiety   . Back pain   . GERD (gastroesophageal reflux disease)   . Peripheral neuropathy   . Arthritis   . Chronic diastolic CHF (congestive heart failure)     a. 04/2013 Echo: EF nl, no rwma, mild LVH.  Marland Kitchen Active smoker   . ESRD on hemodialysis     [name] 4 days per week on M-Tu-Wed and Fri.  Started HD in 1998 and has been on HD initially at [facility], then went to  [facility], then in [facility], then to [facility] and now is at [facility] for last 10 years.  Has L thigh AVG.     . Diabetes mellitus     diet controlled  . 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 PONV (postoperative nausea and vomiting)   . Hypertension   . Bell palsy   . Carpal tunnel syndrome     bilateral  . Palpitations   . Headache(784.0)     Hx: of Migraines  . CAD (coronary artery disease)     a. 04/2013 Ant STEMI/PCI: LM nl, LAD 95p (3.0x18 Xience DES), D1/2/3 small, LCX 80ost, RI 20p, RCA 133m CTO (unsuccessful PCI), EF 55%, mod basal inf HK.  . S/P coronary artery stent placement 05/21/2013    Xience Alpine Medtronic conditional 5 @ 1.5 and 3T      PSH:   Past Surgical History  Procedure Laterality Date  . Total hip arthroplasty    . Thyroidectomy    . Tooth extraction    . Mandible fracture surgery    . Dg av dialysis graft declot or    . Insertion of dialysis catheter  03/28/2011    Procedure: INSERTION OF DIALYSIS CATHETER;  Surgeon: Pryor Ochoa, MD;  Location: [facility] OR;  Service: Vascular;  Laterality: Left;  . Av fistula placement  03/31/2011    Procedure: INSERTION OF ARTERIOVENOUS (AV) GORE-TEX GRAFT THIGH;  Surgeon: Sherren Kerns, MD;  Location: [facility] OR;  Service: Vascular;  Laterality: Right;  redo right thigh arteriovenous gortex graft using gore-tex stretch 83mm x 70cm  . Thrombectomy w/ embolectomy  06/02/2011    Procedure: THROMBECTOMY ARTERIOVENOUS GORE-TEX GRAFT;  Surgeon: Chuck Hint, MD;  Location: [facility] OR;  Service: Vascular;  Laterality: Right;  Thrombectomy right thigh arteriovenous gortex graft;  revision by  replacement of large portion of graft with 48mm gore-tex   . Insertion of dialysis catheter  08/28/2011    Procedure: INSERTION OF DIALYSIS CATHETER;  Surgeon: Fransisco Hertz, MD;  Location: [facility] OR;  Service: Vascular;  Laterality: Left;  Atempted Bilateral Internal Jugular, Bilateral Subclavin insertion of 55cm Dialysis Catheter Left  Femoral  . Insertion of dialysis catheter  12/15/2011    Procedure: INSERTION OF DIALYSIS CATHETER;  Surgeon: Sherren Kerns, MD;  Location: [facility] OR;  Service: Vascular;  Laterality: Right;  Insertion of Right Femoral Dialysis Catheter  . Exchange of a dialysis catheter  02/26/2012    Procedure: EXCHANGE OF A DIALYSIS CATHETER;  Surgeon: Larina Earthly, MD;  Location: [facility] OR;  Service: Vascular;  Laterality: Right;  . Joint replacement    . Exchange of a dialysis catheter  05/18/2012    Procedure: EXCHANGE OF A DIALYSIS CATHETER;  Surgeon: Larina Earthly, MD;  Location: [facility] OR;  Service: Vascular;  Laterality: Right;  right femoral dialysis catheter  . Av fistula placement  05/18/2012    Procedure: INSERTION OF ARTERIOVENOUS (AV) GORE-TEX GRAFT THIGH;  Surgeon: Larina Earthly, MD;  Location: [facility] OR;  Service: Vascular;  Laterality: Left;  using 20mm by 70cm goretex graft  . Thrombectomy w/ embolectomy Left 08/25/2012    Procedure: THROMBECTOMY ARTERIOVENOUS GORE-TEX GRAFT;  Surgeon: Pryor Ochoa, MD;  Location: [facility] OR;  Service: Vascular;  Laterality: Left;  Attempted thrombectomy of left thigh arteriovenous gortex graft.   . Av fistula placement Left 08/25/2012    Procedure: INSERTION OF ARTERIOVENOUS (AV) GORE-TEX GRAFT THIGH;  Surgeon: Pryor Ochoa, MD;  Location: [facility] OR;  Service: Vascular;  Laterality: Left;  Using 11mm x 40cm vascular Gortex graft.  . Revision of arteriovenous goretex graft Left 12/14/2012    Procedure: Revision of Left Thigh Graft;  Surgeon: Larina Earthly, MD;  Location: [facility] OR;  Service: Vascular;  Laterality: Left;  . Avgg removal Left 02/16/2013    Procedure: EXCISION OF LEFT ARM ARTERIOVENOUS GORETEX GRAFT TIMES 2 WITH VEIN PATCH ANGIOPLASTY OF BRACIAL ARTERY.  ;  Surgeon: Chuck Hint, MD;  Location: [facility] OR;  Service: Vascular;  Laterality: Left;  Converted from MAC to General.    . Revision of arteriovenous goretex graft Left 08/08/2013    Procedure: REVISION OF LEFT THIGH  ARTERIOVENOUS GORETEX GRAFT;  Surgeon: Sherren Kerns, MD;  Location: [facility] OR;  Service: Vascular;  Laterality: Left;  . Venogram Bilateral 10/27/2011    Procedure: VENOGRAM;  Surgeon: Nada Libman, MD;  Location: [facility] CATH LAB;  Service: Cardiovascular;  Laterality: Bilateral;  bilat upper extrem venograms  . Left heart cath Bilateral 05/21/2013    Procedure: LEFT HEART CATH;  Surgeon: Iran Ouch, MD;  Location: [facility] CATH LAB;  Service: Cardiovascular;  Laterality: Bilateral;  . Percutaneous coronary stent intervention (pci-s)  05/21/2013    Procedure: PERCUTANEOUS CORONARY STENT INTERVENTION (  PCI-S);  Surgeon: Iran Ouch, MD;  Location: [facility] CATH LAB;  Service: Cardiovascular;;  . Coronary angioplasty with stent placement    . Radiology with anesthesia N/A 06/27/2014    Procedure: MRI LUMBER WITHOUT CONTRAST;  Surgeon: Medication Radiologist, MD;  Location: [facility] OR;  Service: Radiology;  Laterality: N/A;  . Revision of arteriovenous goretex graft Left 08/13/2014    Procedure: REVISION OF ARTERIOVENOUS GORETEX GRAFT LEFT THIGH;  Surgeon: Sherren Kerns, MD;  Location: [name] OR;  Service: Vascular;  Laterality: Left;  . Radiology with anesthesia N/A 08/16/2014    Procedure: MRI;  Surgeon: Medication Radiologist, MD;  Location: [facility] OR;  Service: Radiology;  Laterality: N/A;  . Thrombectomy w/ embolectomy Left 08/22/2014    Procedure: THROMBECTOMY ARTERIOVENOUS GORE-TEX GRAFT Of Left Thigh Graft;  Surgeon: Larina Earthly, MD;  Location: [facility] OR;  Service: Vascular;  Laterality: Left;    Allergies:  Review of patient's allergies indicates no known allergies. Prior to Admit Meds:   Prescriptions prior to admission  Medication Sig Dispense Refill Last Dose  . Amino Acids-Protein Hydrolys (FEEDING SUPPLEMENT, PRO-STAT SUGAR FREE 64,) LIQD Take 30 mLs by mouth 2 (two) times daily.   10/07/2014 at am  . aspirin 81 MG tablet Take 1 tablet (81 mg total) by mouth daily.   10/07/2014 at Unknown time  . atorvastatin  (LIPITOR) 80 MG tablet Take 1 tablet (80 mg total) by mouth daily at 6 PM. 30 tablet 11 10/06/2014 at Unknown time  . B Complex-C (B-COMPLEX WITH VITAMIN C) tablet Take 1 tablet by mouth at bedtime.   10/06/2014 at Unknown time  . calcium carbonate (TUMS - DOSED IN MG ELEMENTAL CALCIUM) 500 MG chewable tablet Chew 3 tablets by mouth 3 (three) times daily.   10/07/2014 at 900  . colchicine 0.6 MG tablet Take 0.6 mg by mouth every 12 (twelve) hours as needed (gout).    unknown  . dorzolamide-timolol (COSOPT) 22.3-6.8 MG/ML ophthalmic solution Place 1 drop into both eyes 3 (three) times daily.   10/07/2014 at 900  . escitalopram (LEXAPRO) 10 MG tablet Take 10 mg by mouth daily.   10/07/2014 at Unknown time  . gabapentin (NEURONTIN) 100 MG capsule Take 100 mg by mouth every 12 (twelve) hours as needed (neuropathy).    unknown  . latanoprost (XALATAN) 0.005 % ophthalmic solution Place 1 drop into both eyes at bedtime.   2 10/06/2014 at Unknown time  . loperamide (IMODIUM A-D) 2 MG tablet Take 4 mg by mouth every 4 (four) hours as needed for diarrhea or loose stools.   unknown  . LORazepam (ATIVAN) 0.5 MG tablet Take 1 tablet (0.5 mg total) by mouth every 8 (eight) hours as needed for anxiety. 30 tablet 0 10/06/2014 at 1946  . Menthol, Topical Analgesic, 4 % GEL Apply 1 application topically every 6 (six) hours as needed (pain). Apply to shoulders/lower back   unknown  . nitroGLYCERIN (NITROSTAT) 0.4 MG SL tablet Place 1 tablet (0.4 mg total) under the tongue every 5 (five) minutes as needed for chest pain. 25 tablet 3 unknown  . omeprazole (PRILOSEC) 40 MG capsule Take 40 mg by mouth at bedtime.    10/06/2014 at Unknown time  . oxyCODONE (OXY IR/ROXICODONE) 5 MG immediate release tablet Take 1-2 tablets (5-10 mg total) by mouth every 4 (four) hours as needed for moderate pain. (Patient taking differently: Take 5-10 mg by mouth every 4 (four) hours as needed (1 tablet (5 mg) moderate pain; 2 tablets (10  mg) severe  pain). ) 360 tablet 0 10/07/2014 at 622  . prasugrel (EFFIENT) 10 MG TABS tablet Take 1 tablet (10 mg total) by mouth daily. 30 tablet 6 10/07/2014 at Unknown time  . sodium bicarbonate 650 MG tablet Take 650 mg by mouth 2 (two) times daily.     10/07/2014 at 900  . sulindac (CLINORIL) 200 MG tablet Take 200 mg by mouth 2 (two) times daily.   10/07/2014 at 900  . vancomycin (VANCOCIN) 1 GM/200ML SOLN Inject 200 mLs (1,000 mg total) into the vein every dialysis. 4000 mL 5 ??   Fam HX:    Family History  Problem Relation Age of Onset  . Diabetes Mother   . Stroke Mother   . Heart disease Mother   . Kidney disease Father   . Hyperlipidemia Father    Social HX:    History   Social History  . Marital Status: Single    Spouse Name: N/A  . Number of Children: N/A  . Years of Education: N/A   Occupational History  . Not on file.   Social History Main Topics  . Smoking status: Current Every Day Smoker -- 0.20 packs/day for 40 years    Types: Cigarettes  . Smokeless tobacco: Never Used  . Alcohol Use: No  . Drug Use: No  . Sexual Activity: No   Other Topics Concern  . Not on file   Social History Narrative     ROS:  All 11 ROS were addressed and are negative except what is stated in the HPI  Physical Exam: Blood pressure 111/47, pulse 79, temperature 98.6 F (37 C), temperature source Oral, resp. rate 17, height  (1.803 m), weight 246 lb 8 oz (111.812 kg), SpO2 100 %.    General: Well developed, well nourished, in no acute distress Head: Eyes PERRLA, No xanthomas.   Normal cephalic and atramatic  Lungs:   Clear bilaterally to auscultation and percussion. Heart:   HRRR S1 S2 Pulses are 2+ & equal.            No carotid bruit. No JVD.  No abdominal bruits. No femoral bruits. Abdomen: Bowel sounds are positive, abdomen soft and non-tender without masses or                  Hernia's noted. Msk:  Back normal, normal gait. Normal strength and tone for age. Extremities:   No  clubbing, cyanosis or edema.  DP +1 Neuro: Alert and oriented X 3. Psych:  Good affect, responds appropriately    Labs:   Lab Results  Component Value Date   WBC 17.4* 10/09/2014   HGB 8.3* 10/09/2014   HCT 25.5* 10/09/2014   MCV 89.8 10/09/2014   PLT 245 10/09/2014    Recent Labs Lab 10/07/14 1444  10/08/14 1423 10/09/14 0520  NA 136  < >  --  136  K 5.2*  < >  --  4.0  CL 97*  < >  --  95*  CO2 25  < >  --  28  BUN 57*  < >  --  33*  CREATININE 8.42*  < >  --  5.62*  CALCIUM 7.4*  < >  --  7.5*  PROT 6.5  --  6.5  --   BILITOT 1.2  --  1.1  --   ALKPHOS 181*  --   --   --   ALT 19  --  19  --  AST 60*  --  58*  --   GLUCOSE 74  < >  --  83  < > = values in this interval not displayed. No results found for: PTT Lab Results  Component Value Date   INR 1.26 10/07/2014   INR 1.10 09/26/2014   INR 1.06 08/22/2014   Lab Results  Component Value Date   CKTOTAL 3442* 08/19/2014   TROPONINI 2.37* 08/11/2014     Lab Results  Component Value Date   CHOL 121 05/23/2013   Lab Results  Component Value Date   HDL 20* 05/23/2013   Lab Results  Component Value Date   LDLCALC 69 05/23/2013   Lab Results  Component Value Date   TRIG 162* 05/23/2013   Lab Results  Component Value Date   CHOLHDL 6.1 05/23/2013   No results found for: LDLDIRECT    Radiology:  No results found.  EKG:  Pending  ASSESSMENT: 1.  Severe 3 vessel ASCAD with chronically occluded RCA, high grade LCx (medical management) s/p PCI of LAD in setting of inferior STEMI.  He has been maintained on DAPT.  Continue ASA and statin 2.  Chronic diastolic CHF 3.  ESRD on HD 4.  Acute on chronic anemia secondary to recent bleeding from graft revision.   5.  HTN  6.  Dyslipidemia - continue statin 7.  Type 2 DM  PLAN:  OK to hold off on Effient for now until bleeding resolved.  Given his severe CAD with occluded RCA and high grade LCx he is high risk if LAD stent closes down.  Would  ultimately like him to be on DAPT once bleeding has resolved and would be ok to change to Plavix.    Quintella Reichert, MD  10/09/2014  9:04 AM

## 2014-10-09 NOTE — Op Note (Signed)
    Patient name: [name] MRN: [id number] DOB: 12-25-1955 Sex: male  10/07/2014 - 10/09/2014 Pre-operative Diagnosis: End-stage renal disease Post-operative diagnosis:  Same Surgeon:  Durene Cal Procedure Performed:  1.  Ultrasound-guided access, left thigh dialysis graft  2.  Shuntogram  3.  Angioplasty left femoral vein  Findings:  Approximately 50% stenosis at the venous outflow tract in the common femoral vein.  This was successfully treated using an 8 x 40 Mustang balloon with residual stenosis less than 20%  Indications:  The patient came in with bleeding from his dialysis access.  He has had trouble with flow rates.  He is here for further evaluation.  He recently was surgically revised  Procedure:  The patient was identified in the holding area and taken to room 8.  The patient was then placed supine on the table and prepped and draped in the usual sterile fashion.  A time out was called.  Ultrasound was used to evaluate the fistula.  The vein was patent and compressible.  A digital ultrasound image was acquired.  The fistula was then accessed under ultrasound guidance using a micropuncture needle.  An 018 wire was then asvanced without resistance and a micropuncture sheath was placed.  Contrast injections were then performed through the sheath.  Findings:  The graft is widely patent.  The arterial venous anastomosis is widely patent.  There is mild narrowing at the venous outflow tract approximately 50%.  The iliac veins are widely patent.  The inferior vena cava is widely patent   Intervention:  Because of the patient's clinical history I elected to proceed with intervention at the venous outflow tract.  Over a woolly wire, a 6 French sheath was inserted.  I selected an 8 x 40 Mustang balloon and perform balloon angioplasty of the outflow tract including the common femoral vein.  Follow-up imaging revealed improved results with residual stenosis less than 20%.  Catheters and  wires were removed and a suture was used to close the access site.  There were no immediate Locations.  Impression:  #1  widely patent arterial venous anastomosis  #2  left iliac vein and inferior vena cava are widely patent  #3  successful angioplasty of the venous outflow tract using a 8 x 40 Mustang balloon   V. Durene Cal, M.D. [facility] Office: [phone] Pager:  [phone]

## 2014-10-09 NOTE — Discharge Summary (Signed)
Family [facility] Discharge Summary  Patient name: [name] Medical record number: [id number] Date of birth: [birth year] Age: 59 y.o. 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 rate 15, height  (1.803 m), weight 247 lb (112.038 kg), SpO2 99 %. General: Obese, having breakfast, no distress.  HEENT: [place]/AT, EOMI, MMM Cardiovascular: RRR, no murmur, distant heart sounds Respiratory: CTAB, unlabored Abdomen: Soft, non-tender, obese, no rebound or guarding MSK: 1+ pitting edema up to mid shin b/l, no TTP Skin: 2.5cm incision that is sutured. No bleeding around the area. No purulence  Neuro: Alert, oriented, sleepy but easily arousable Psych: Flat affect, speech normal  Brief Hospital Course:  [name] is a 59 y.o. male that presented with bleeding at AV graft site. PMH is significant for HTN, Diabetes mellutis type 2, ESRD, CAD s/p stents x2, Chronic pain secondary to degenerative disc disease  In ED, site was not longer actively bleeding and vitals were stable.  Hemoglobin was noted to be 7.5 (patient's baseline 9-10).  He was admitted to [facility] for further evaluation.  Effient was held in the setting of acute bleed.  Vascular surgery was consulted, who recommended that patient be continued on Vancomycin.  They also performed a shuntogram with angioplasty (please see operative note for details).  Patient  tolerated procedure well.  Patient's hemoglobin was monitored.  On hospital day #2 he was noted to have a hgb 5.5.  He was transfused 2 units during his hemodialysis session and his hemoglobin responded appropriately to 8.4.  At discharge hemoglobin was 7.8 (s/p shuntogram/angioplasty).    Renal was consulted and closely followed patient.  Patient was continued on his HD regimen during hospitalization.    Cardiology was consulted in the setting of patient on Effient for CAD w/ LAD stent.  Cardiology recommended dual therapy at discharge and recommended that patient be treated with Plavix and ASA at discharge.  This change was discussed with Vascular Surgery physician assistant and they found no barriers to starting dual therapy at discharge.  Patient was discharged in stable condition to [facility].  Discharge instructions and return precautions were reviewed with patient, who voiced good understanding.  Issues for Follow Up:  1. Integrity of L hemodialysis graft.  Not bleeding at discharge. 2. Patient's Effient discontinued and Plavix and ASA prescribed at discharge.  Follow up with cardiology.  Significant Procedures: Shuntogram with angioplasty  Significant Labs and Imaging:   Recent Labs Lab 10/08/14 2208 10/09/14 0520 10/10/14 0905  WBC 19.5* 17.4* 14.2*  HGB 8.4* 8.3* 7.8*  HCT 25.9* 25.5* 24.5*  PLT 256 245 281    Recent Labs Lab 10/07/14 1444 10/08/14 0913 10/08/14 1423 10/09/14 0520 10/10/14 0905  NA 136 136  --  136 138  K 5.2* 5.5*  --  4.0 4.6  CL 97* 99*  --  95* 97*  CO2 25 25  --  28 29  GLUCOSE 74 74  --  83 85  BUN 57* 69*  --  33* 48*  CREATININE  8.42* 9.13*  --  5.62* 7.31*  CALCIUM 7.4* 7.0*  --  7.5* 7.6*  PHOS  --  6.0*  --   --   --   ALKPHOS 181*  --   --   --   --   AST 60*  --  58*  --   --   ALT 19  --  19  --   --   ALBUMIN 2.2* 2.2*  --   --   --    Shuntogram: Impression: #1 widely patent arterial venous  anastomosis #2 left iliac vein and inferior vena cava are widely patent #3 successful angioplasty of the venous outflow tract using a 8 x 40 Mustang balloon  Results/Tests Pending at Time of Discharge: none  Discharge Medications:    Medication List    STOP taking these medications        prasugrel 10 MG Tabs tablet  Commonly known as:  EFFIENT      TAKE these medications        aspirin EC 81 MG tablet  Take 1 tablet (81 mg total) by mouth daily.     atorvastatin 80 MG tablet  Commonly known as:  LIPITOR  Take 1 tablet (80 mg total) by mouth daily at 6 PM.     B-complex with vitamin C tablet  Take 1 tablet by mouth at bedtime.     calcium carbonate 500 MG chewable tablet  Commonly known as:  TUMS - dosed in mg elemental calcium  Chew 3 tablets by mouth 3 (three) times daily.     clopidogrel 75 MG tablet  Commonly known as:  PLAVIX  Take 1 tablet (75 mg total) by mouth daily.     colchicine 0.6 MG tablet  Take 0.6 mg by mouth every 12 (twelve) hours as needed (gout).     dorzolamide-timolol 22.3-6.8 MG/ML ophthalmic solution  Commonly known as:  COSOPT  Place 1 drop into both eyes 3 (three) times daily.     escitalopram 10 MG tablet  Commonly known as:  LEXAPRO  Take 10 mg by mouth daily.     feeding supplement (PRO-STAT SUGAR FREE 64) Liqd  Take 30 mLs by mouth 2 (two) times daily.     fentaNYL 50 MCG/HR  Commonly known as:  DURAGESIC - dosed mcg/hr  Apply one patch topically every 72 hours for pain. Remove old patch     gabapentin 100 MG capsule  Commonly known as:  NEURONTIN  Take 100 mg by mouth every 12 (twelve) hours as needed (neuropathy).     latanoprost 0.005 % ophthalmic solution  Commonly known as:  XALATAN  Place 1 drop into both eyes at bedtime.     loperamide 2 MG tablet  Commonly known as:  IMODIUM A-D  Take 4 mg by mouth every 4 (four) hours as needed for diarrhea or loose stools.     LORazepam 0.5 MG tablet   Commonly known as:  ATIVAN  Take 1 tablet (0.5 mg total) by mouth every 8 (eight) hours as needed for anxiety.     Menthol (Topical Analgesic) 4 % Gel  Apply 1 application topically every 6 (six) hours as needed (pain). Apply to shoulders/lower back     nitroGLYCERIN 0.4 MG SL tablet  Commonly known as:  NITROSTAT  Place 1 tablet (0.4 mg total) under the tongue every 5 (five) minutes as needed for chest pain.     omeprazole 40 MG capsule  Commonly known as:  PRILOSEC  Take 40 mg by mouth at bedtime.     oxyCODONE 5 MG immediate release tablet  Commonly known as:  Oxy IR/ROXICODONE  Take 1-2 tablets (5-10 mg total) by mouth every 4 (four) hours as needed for moderate pain.     sodium bicarbonate 650 MG tablet  Take 650 mg by mouth 2 (two) times daily.     sulindac 200 MG tablet  Commonly known as:  CLINORIL  Take 200 mg by mouth 2 (two) times daily.     vancomycin 1 GM/200ML Soln  Commonly known as:  VANCOCIN  Inject 200 mLs (1,000 mg total) into the vein every dialysis.        Discharge Instructions: Please refer to Patient Instructions section of EMR for full details.  Patient was counseled important signs and symptoms that should prompt return to medical care, changes in medications, dietary instructions, activity restrictions, and follow up appointments.   Follow-Up Appointments:   Raliegh Ip, DO 10/10/2014, 2:19 PM PGY-1, [facility]

## 2014-10-09 NOTE — H&P (View-Only) (Signed)
[facility]  Subjective  - wants out of bed   Objective 125/55 86 98.9 F (37.2 C) (Oral) 17 100%  Intake/Output Summary (Last 24 hours) at 10/08/14 1813 Last data filed at 10/08/14 1501  Gross per 24 hour  Intake   1110 ml  Output   2980 ml  Net  -1870 ml   Left thigh graft edema unchanged, no bleeding  Assessment/Planning: Left thigh shuntogram +/- intervention tomorrow by my partner Dr Frazier Butt 10/08/2014 6:13 PM --  Laboratory Lab Results:  Recent Labs  10/07/14 1444 10/08/14 0900  WBC 13.4* 17.9*  HGB 7.5* 5.5*  HCT 24.0* 17.0*  PLT 391 285   BMET  Recent Labs  10/07/14 1444 10/08/14 0913  NA 136 136  K 5.2* 5.5*  CL 97* 99*  CO2 25 25  GLUCOSE 74 74  BUN 57* 69*  CREATININE 8.42* 9.13*  CALCIUM 7.4* 7.0*    COAG Lab Results  Component Value Date   INR 1.26 10/07/2014   INR 1.10 09/26/2014   INR 1.06 08/22/2014   No results found for: PTT

## 2014-10-10 ENCOUNTER — Encounter (HOSPITAL_COMMUNITY): Payer: Self-pay | Admitting: Surgery

## 2014-10-10 DIAGNOSIS — T82898D Other specified complication of vascular prosthetic devices, implants and grafts, subsequent encounter: Secondary | ICD-10-CM

## 2014-10-10 LAB — BASIC METABOLIC PANEL
ANION GAP: 12 (ref 5–15)
BUN: 48 mg/dL — ABNORMAL HIGH (ref 6–20)
CALCIUM: 7.6 mg/dL — AB (ref 8.9–10.3)
CHLORIDE: 97 mmol/L — AB (ref 101–111)
CO2: 29 mmol/L (ref 22–32)
Creatinine, Ser: 7.31 mg/dL — ABNORMAL HIGH (ref 0.61–1.24)
GFR calc non Af Amer: 7 mL/min — ABNORMAL LOW (ref >60)
GFR, EST AFRICAN AMERICAN: 8 mL/min — AB (ref >60)
Glucose, Bld: 85 mg/dL (ref 65–99)
Potassium: 4.6 mmol/L (ref 3.5–5.1)
SODIUM: 138 mmol/L (ref 135–145)

## 2014-10-10 LAB — CBC
HCT: 24.5 % — ABNORMAL LOW (ref 39.0–52.0)
Hemoglobin: 7.8 g/dL — ABNORMAL LOW (ref 13.0–17.0)
MCH: 28.9 pg (ref 26.0–34.0)
MCHC: 31.8 g/dL (ref 30.0–36.0)
MCV: 90.7 fL (ref 78.0–100.0)
Platelets: 281 10*3/uL (ref 150–400)
RBC: 2.7 MIL/uL — ABNORMAL LOW (ref 4.22–5.81)
RDW: 16.6 % — ABNORMAL HIGH (ref 11.5–15.5)
WBC: 14.2 10*3/uL — AB (ref 4.0–10.5)

## 2014-10-10 LAB — VANCOMYCIN, RANDOM: VANCOMYCIN RM: 28 ug/mL

## 2014-10-10 MED ORDER — VANCOMYCIN HCL IN DEXTROSE 750-5 MG/150ML-% IV SOLN
750.0000 mg | INTRAVENOUS | Status: DC
  Administered 2014-10-10: 750 mg via INTRAVENOUS
  Filled 2014-10-10: qty 150

## 2014-10-10 MED ORDER — DARBEPOETIN ALFA 150 MCG/0.3ML IJ SOSY
PREFILLED_SYRINGE | INTRAMUSCULAR | Status: AC
  Filled 2014-10-10: qty 0.3

## 2014-10-10 MED ORDER — CLOPIDOGREL BISULFATE 75 MG PO TABS: 75.0000 mg | ORAL_TABLET | Freq: Every day | ORAL | Status: DC

## 2014-10-10 MED ORDER — DARBEPOETIN ALFA 150 MCG/0.3ML IJ SOSY
150.0000 ug | PREFILLED_SYRINGE | INTRAMUSCULAR | Status: DC
  Administered 2014-10-10: 150 ug via INTRAVENOUS
  Filled 2014-10-10: qty 0.3

## 2014-10-10 MED ORDER — VANCOMYCIN HCL IN DEXTROSE 1-5 GM/200ML-% IV SOLN: 1000.0000 mg | INTRAVENOUS | Status: DC

## 2014-10-10 MED ORDER — OXYCODONE HCL 5 MG PO TABS
ORAL_TABLET | ORAL | Status: AC
  Filled 2014-10-10: qty 2

## 2014-10-10 MED FILL — Heparin Sodium (Porcine) 2 Unit/ML in Sodium Chloride 0.9%: INTRAMUSCULAR | Qty: 500 | Status: AC

## 2014-10-10 MED FILL — Lidocaine HCl Local Preservative Free (PF) Inj 1%: INTRAMUSCULAR | Qty: 30 | Status: AC

## 2014-10-10 NOTE — Clinical Social Work Note (Addendum)
Clinical Social Work Assessment  Patient Details  Name: [name] MRN: [id number] Date of Birth: [birth year]  Date of referral:  10/07/14               Reason for consult:  Facility Placement                Permission sought to share information with:  Facility Medical sales representative Permission granted to share information::     Name::      [name]  Agency::     Relationship::   Mother  Contact Information:   [phone]  Housing/Transportation Living arrangements for the past 2 months:  Skilled Nursing Facility Source of Information:  Facility, Patient Patient Interpreter Needed:  None Criminal Activity/Legal Involvement Pertinent to Current Situation/Hospitalization:  No - Comment as needed Significant Relationships:  Other Family Members and a significant other, patient refers to as his wife. Lives with:  Facility Resident Do you feel safe going back to the place where you live?  Yes Need for family participation in patient care:  No (Coment)  Care giving concerns: Patient wants CSW to assure he gets rehab at skilled facility.   Social Worker assessment / plan:  [name] is from [facility] and plans to return. CSW talked to patient after 5:45 pm, after his return from dialysis. He is aware that he will discharge back to [facility].  Patient expressed no concerns with his care. [name] requested that CSW contact his mother and he will contact his wife. As CSW was leaving patient's room, he was calling his wife.   Employment status:  Disabled (Comment on whether or not currently receiving Disability) Insurance information:  Medicare, Medicaid In [place] PT Recommendations:  Not assessed at this time Information / Referral to community resources:   (Not needed or requested at this time)  Patient/Family's Response to care:  No concerns expressed regarding care.  Patient/Family's Understanding of and Emotional Response to Diagnosis, Current Treatment, and Prognosis:   Not discussed.  Emotional Assessment Appearance:  Appears older than stated age Attitude/Demeanor/Rapport:  Other (Appropriate for interaction ) Affect (typically observed):  Appropriate Orientation:  Oriented to Self, Oriented to Place, Oriented to  Time, Oriented to Situation Alcohol / Substance use:  Tobacco Use (No alcohol use reported) Psych involvement (Current and /or in the community):  No (Comment)  Discharge Needs  Concerns to be addressed:  Discharge Planning Concerns (Patient returning to [facility]) Readmission within the last 30 days:    Current discharge risk:  None Barriers to Discharge:  No Barriers Identified   Cristobal Goldmann, LCSW 10/10/2014, 5:02 PM

## 2014-10-10 NOTE — Progress Notes (Signed)
Family Medicine Teaching Service Daily Progress Note Intern Pager: [phone]  Patient name: [name] Medical record number: [id number] Date of birth: Sep 14, 1955 Age: 59 y.o. Gender: male  Primary Care Provider: Dyke Maes, MD Consultants: vascular surgery, nephrology Code Status: Full  Pt Overview and Major Events to Date:  06/13: Admitted 06/14: shuntogram w/ angioplasty   Assessment and Plan: [name] is a 59 y.o. male presenting with bleeding at AV graft site. PMH is significant for HTN, Diabetes mellutis type 2, ESRD, CAD s/p stents x2, Chronic pain secondary to degenerative disc disease  #Bleeding from AV graft: unfortunately this is the last site available for a graft. Per chart review, vascular has been trying to preserve this graft with the help of infectious disease and nephrology. Plan outpatient was for a shuntogram this upcoming week. Currently, bleeding has stopped and vitals are stable. Hemoglobin dropped from baseline of 9-10 to 7.5.  s/p 2u pRBCs in HD 6/13. Hgb 8.4>8.3>pending - follow vascular recommendations: shuntogram w/ angioplasty 6/14 - Cardiology: hold Effient, OK to continue aspirin, SCDs per vascular. Consider change to plavix.  #Infection of AV graft: follows ID outpatient. Has been on antibiotics since April 2016. Initially on Ceftaz with plans for a 6 week course and subsequent switch to 3-6 months of Augmentin for suppressive therapy.; After visit about one week ago, plan revised secondary to purulent discharge. Currently on a 6-8 week course of IV vancomycin. Patient is afebrile and without a SIRS picture. Received one dose of vancomycin in the ED, although is supposed to obtain with dialysis. WBC 19.5>17.4>pending - vancomycin per pharmacy - fever curve, afebrile - blood cultures x2 if develops a fever  #Normocytic, normochromic anemia; acute on chronic: most likely from acute blood loss. Hemoglobin drop to 7.5 with a baseline of 9-10. Also  with history of CAD. Hgb 8.4>8.3>am lab pending - transfuse during dialysis per nephrology  #ESRD on dialysis (MWF, sometimes Tuesday): last dialysis session last Friday. See's [facility] outpatient and obtains dialysis at [facility] - nephrology consulted in ED; will follow recommendations - continue home sodium bicarbonate 650mg  BID - continue Tums - Cmet in AM  #Chronic pain - hold gabapentin, sulindac - continue fentanyl patch - decrease home oxycodone to 5-10mg  q6hrs  #DM2: not on medication outpatient. Last A1C of 5.4 in April of 2016 - CBG daily w/ BMET  #CAD s/p stents - continue home aspirin - continue home atorvastatin - hold Effient per vascular surgery.  Can consider dual therapy with ASA and Plavix at discharge.  #Depression: stable - continue Lexapro  FEN/GI: Renal diet, SLIV Prophylaxis: SCD, right leg  Disposition: Discharge pending renal recommendations.  Likely today vs tomorrow am if hgb stable.  Subjective:  Patient reports that he is doing ok.  Continues to have chronic pain in his back and hands.  HD site no longer bleeding.  Tolerated procedure well.  Has HD today.    Objective: Temp:  [97.6 F (36.4 C)-99.1 F (37.3 C)] 98 F (36.7 C) (06/15 0449) Pulse Rate:  [78-84] 81 (06/15 0449) Resp:  [15-18] 15 (06/15 0449) BP: (101-146)/(50-74) 105/70 mmHg (06/15 0449) SpO2:  [94 %-100 %] 99 % (06/15 0449) Weight:  [247 lb (112.038 kg)] 247 lb (112.038 kg) (06/14 2030) Physical Exam: General: awake, alert, NAD, having breakfast Cardiovascular: RRR, no murmurs Respiratory: CTAB, no increased WOB, no wheeze Abdomen: obese, soft, NT/ND Extremities: L femoral cath site no active bleeding, sutures in place without evidence of infection, +  1 pitting edema of b/l LE  Laboratory:  Recent Labs Lab 10/08/14 0900 10/08/14 2208 10/09/14 0520  WBC 17.9* 19.5* 17.4*  HGB 5.5* 8.4* 8.3*  HCT 17.0* 25.9* 25.5*  PLT 285 256 245    Recent  Labs Lab 10/07/14 1444 10/08/14 0913 10/08/14 1423 10/09/14 0520  NA 136 136  --  136  K 5.2* 5.5*  --  4.0  CL 97* 99*  --  95*  CO2 25 25  --  28  BUN 57* 69*  --  33*  CREATININE 8.42* 9.13*  --  5.62*  CALCIUM 7.4* 7.0*  --  7.5*  PROT 6.5  --  6.5  --   BILITOT 1.2  --  1.1  --   ALKPHOS 181*  --   --   --   ALT 19  --  19  --   AST 60*  --  58*  --   GLUCOSE 74 74  --  83   Imaging/Diagnostic Tests: No results found.  Raliegh Ip, DO 10/10/2014, 7:27 AM PGY-1, [facility] FPTS Intern pager: [phone], text pages welcome

## 2014-10-10 NOTE — Progress Notes (Signed)
Subjective:   Complaining of back and hip pain.   Objective Filed Vitals:   10/09/14 1604 10/09/14 2030 10/10/14 0449 10/10/14 0945  BP: 127/70 101/50 105/70 110/52  Pulse: 84 80 81 75  Temp: 97.6 F (36.4 C) 99.1 F (37.3 C) 98 F (36.7 C) 98.3 F (36.8 C)  TempSrc: Oral Oral Oral Oral  Resp: Height:      Weight:  112.038 kg (247 lb)    SpO2: 100% 94% 99% 100%   Physical Exam General: alert and oriented. No acute distress.  Heart: RRR  Lungs: CTA, unlabored Abdomen: soft, obese, nontender +BS Extremities: +1LLE edema Dialysis Access:  L thigh AVG +b/t dressing dry and intact  Dialysis Orders: MWF REVISED 4 hrs 115 kg 2K/3.5Ca 450/800profile 4 Heparin8000 Left thigh AVG No Vitamin D Mircera q2wk- 75 - last given 6/8 Hgb 9.1  Venofer none tsat 27% ferritin 132 before acute blood loss On IV Vanc 1 gm through 7/27  Assessment/Plan: 1. Bleeding graft (arterial limb) due to degeneration - sutured in ED 6/12 , no further bleed; recently had venous limb replaced 07/2014 due to degeneration. is on a 6 week course of empiric Vanc.  Shuntogram 6/14- successful PTA of 50% stenosis of venous outflow tract- Dr Myra Gianotti 2. ESRD -MWF - HD pending today 3. Anemia - Hgb 7.8 up from 5.5 on 6/13 transfused 2 units on HD 6/13- dose Aranesp 150 Wed- add IV Fe- 125 x 5 4. Sec HPTH - corrected Ca+ 8.9/ phos 6 not on vit D, s/p PTX-on tums 3 ac 5. HTN/volume - 110/52, most swelling in the left leg -but doesn't seem to be a hematoma/chronic per pt.- under edw per wts here.  6. Nutrition - renal diet/ vit 7. 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 CAD hx stent 9. Bilateral CTS 10. Chronic back pain/ debility 11. Morbid obesity- 12. Disp - to return to [facility]  Jetty Duhamel, NP [facility] Beeper [phone] 10/10/2014,10:23 AM  LOS: 3 days   Pt seen, examined and agree w A/P as above. Ready for  discharge from a renal standpoint.  Vinson Moselle MD pager [phone]    cell [phone] 10/10/2014, 3:22 PM    Additional Objective Labs: Basic Metabolic Panel:  Recent Labs Lab 10/08/14 0913 10/09/14 0520 10/10/14 0905  NA 136 136 138  K 5.5* 4.0 4.6  CL 99* 95* 97*  CO2 GLUCOSE 74 83 85  BUN 69* 33* 48*  CREATININE 9.13* 5.62* 7.31*  CALCIUM 7.0* 7.5* 7.6*  PHOS 6.0*  --   --    Liver Function Tests:  Recent Labs Lab 10/07/14 1444 10/08/14 0913 10/08/14 1423  AST 60*  --  58*  ALT 19  --  19  ALKPHOS 181*  --   --   BILITOT 1.2  --  1.1  PROT 6.5  --  6.5  ALBUMIN 2.2* 2.2*  --    No results for input(s): LIPASE, AMYLASE in the last 168 hours. CBC:  Recent Labs Lab 10/07/14 1444 10/08/14 0900 10/08/14 2208 10/09/14 0520 10/10/14 0905  WBC 13.4* 17.9* 19.5* 17.4* 14.2*  HGB 7.5* 5.5* 8.4* 8.3* 7.8*  HCT 24.0* 17.0* 25.9* 25.5* 24.5*  MCV 92.7 89.9 89.0 89.8 90.7  PLT 391 285 256 245 281   Blood Culture    Component Value Date/Time   SDES HEMODIALYSIS GRAFT 08/19/2014 1020   SPECREQUEST Normal  08/19/2014 1020   CULT  08/19/2014 1020    NO GROWTH 2 DAYS Performed at [facility]    REPTSTATUS 08/22/2014 FINAL 08/19/2014 1020    Cardiac Enzymes: No results for input(s): CKTOTAL, CKMB, CKMBINDEX, TROPONINI in the last 168 hours. CBG:  Recent Labs Lab 10/07/14 1706 10/07/14 2001 10/08/14 0743  GLUCAP 80 87 78   Iron Studies: No results for input(s): IRON, TIBC, TRANSFERRIN, FERRITIN in the last 72 hours. @lablastinr3 @ Studies/Results: No results found. Medications:   . aspirin EC  81 mg Oral Daily  . atorvastatin  80 mg Oral q1800  . B-complex with vitamin C  1 tablet Oral QHS  . calcium carbonate  3 tablet Oral TID  . darbepoetin (ARANESP) injection - DIALYSIS  100 mcg Intravenous Q Wed-HD  . docusate sodium  100 mg Oral Daily  . dorzolamide-timolol  1 drop Both Eyes TID  . escitalopram  10 mg Oral Daily  .  feeding supplement (PRO-STAT SUGAR FREE 64)  30 mL Oral BID  . fentaNYL  50 mcg Transdermal Q72H  . ferric gluconate (FERRLECIT/NULECIT) IV  125 mg Intravenous Q M,W,F-HD  . gabapentin  100 mg Oral TID  . latanoprost  1 drop Both Eyes QHS  . pantoprazole  40 mg Oral Daily  . sodium bicarbonate  650 mg Oral BID  . vancomycin  1,000 mg Intravenous Q M,W,F-HD

## 2014-10-10 NOTE — Progress Notes (Signed)
[facility] [address] [place], [place] [address] Tel: [phone]   Ambulance transport contacted to arrange for transport back to [facility]. Attempted to call report to [facility]. No answer. Bess Kinds, RN

## 2014-10-10 NOTE — Discharge Instructions (Signed)
You were admitted for a bleeding graft.  This was assessed and managed by vascular surgery.  Cardiology was consulted regarding your bleeding on Effient.  They are recommending that you STOP Effient and START Plavix and Aspirin at discharge.  Please follow up with them after discharge.

## 2014-10-10 NOTE — Progress Notes (Signed)
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 site  No Known Allergies  Patient Measurements: Height: 5\' 11"  (180.3 cm) Weight: 247 lb 2.2 oz (112.1 kg) IBW/kg (Calculated) : 75.3   Vital Signs: Temp: 97.6 F (36.4 C) (06/15 1224) Temp Source: Oral (06/15 1224) BP: 96/59 mmHg (06/15 1300) Pulse Rate: 73 (06/15 1300) Intake/Output from previous day: 06/14 0701 - 06/15 0700 In: 480 [P.O.:480] Out: 0  Intake/Output from this shift:    Labs:  Recent Labs  10/08/14 0913 10/08/14 2208 10/09/14 0520 10/10/14 0905  WBC  --  19.5* 17.4* 14.2*  HGB  --  8.4* 8.3* 7.8*  PLT  --  256 245 281  CREATININE 9.13*  --  5.62* 7.31*   Estimated Creatinine Clearance: 13.9 mL/min (by C-G formula based on Cr of 7.31).  Recent Labs  10/07/14 1930 10/10/14 1115  VANCOTROUGH 10  --   VANCORANDOM  --  28     Microbiology: Recent Results (from the past 720 hour(s))  MRSA PCR Screening     Status: None   Collection Time: 10/07/14  5:36 PM  Result Value Ref Range Status   MRSA by PCR NEGATIVE NEGATIVE Final    Comment:        The GeneXpert MRSA Assay (FDA approved for NASAL specimens only), is one component of a comprehensive MRSA colonization surveillance program. It is not intended to diagnose MRSA infection nor to guide or monitor treatment for MRSA infections.      Assessment: 59 yo male admitted with pre-existing wound infection of AV graft.   WBC 19.5>17.4>14.2, afeb,  ESRD. He was on IV Vanc 1g QHD PTA started 09/26/14 per med rec prior to this admit due to purulent discharge. He is on a 6 week course of empiric Vanc. He has been on ABXs since April 2016. . Initially on Ceftaz with plans for a 6 week course and subsequent switch to 3-6 months of Augmentin for suppressive therapy.; After visit about 1 week ago, plan revised 2/2 purulent discharge. Currently on a 6-8 week course of IV vancomycin. Vanc 1g was given post  HD on 6/13,  6/12 @19 :30 Vancomycin random level = 10 - Gave additional 1500 mg 10/07/14 to bring level up to ~ 25  (looks like dose was 1gm qHD) 6/13 Vanc 1g was given post HD on 6/13 6/15 pre HD vanc level = 28 on  1 gm qMWF< decr to 750 for 2 HD sessions, then back to 1 gm  Goal of Therapy:  Pre-HD vancomycin trough 15-25  Plan: Decrease Vancomycin to 750 post-HD (qMWF)- x 2 HD, then back to 1 gm qMWF on Monday 6/20 Will f/u daily  Herby Abraham, Pharm.D. [phone] 10/10/2014 1:35 PM

## 2014-10-10 NOTE — Progress Notes (Addendum)
  Progress Note    10/10/2014 8:32 AM 1 Day Post-Op  Subjective:  C/o back pain & hip with sitting up  Tm 99.1 now afebrile  Filed Vitals:   10/10/14 0449  BP: 105/70  Pulse: 81  Temp: 98 F (36.7 C)  Resp: 15    Physical Exam: Incisions:  Cath site with bandage that is clean without any drainge Extremities:  +thrill within graft; prolene sutures in place laterally   CBC    Component Value Date/Time   WBC 17.4* 10/09/2014 0520   RBC 2.84* 10/09/2014 0520   HGB 8.3* 10/09/2014 0520   HCT 25.5* 10/09/2014 0520   PLT 245 10/09/2014 0520   MCV 89.8 10/09/2014 0520   MCH 29.2 10/09/2014 0520   MCHC 32.5 10/09/2014 0520   RDW 16.7* 10/09/2014 0520   LYMPHSABS 1.5 09/26/2014 1508   MONOABS 0.8 09/26/2014 1508   EOSABS 0.4 09/26/2014 1508   BASOSABS 0.0 09/26/2014 1508    BMET    Component Value Date/Time   NA 136 10/09/2014 0520   K 4.0 10/09/2014 0520   CL 95* 10/09/2014 0520   CO2 28 10/09/2014 0520   GLUCOSE 83 10/09/2014 0520   BUN 33* 10/09/2014 0520   CREATININE 5.62* 10/09/2014 0520   CREATININE 5.62* 09/26/2014 1508   CALCIUM 7.5* 10/09/2014 0520   GFRNONAA 10* 10/09/2014 0520   GFRNONAA 10* 09/26/2014 1508   GFRAA 12* 10/09/2014 0520   GFRAA 12* 09/26/2014 1508    INR    Component Value Date/Time   INR 1.26 10/07/2014 1444     Intake/Output Summary (Last 24 hours) at 10/10/14 0832 Last data filed at 10/10/14 0600  Gross per 24 hour  Intake    480 ml  Output      0 ml  Net    480 ml     Assessment:  59 y.o. male is s/p:  1. Ultrasound-guided access, left thigh dialysis graft 2. Shuntogram 3. Angioplasty left femoral vein  1 Day Post-Op  Plan: -pt with patent left thigh AVG with +thrill -bandage from cath site is clean without drainage. -sutures to remain for 7-10 days -leukocytosis improved yesterday   Doreatha Massed, PA-C [facility] [phone] 10/10/2014 8:32  AM    I have examined the patient, reviewed and agree with above. Currently on hemodialysis via left femoral loop graft. Area that he had bleeding from appears to be healing with the sutures intact.  Gretta Began, MD 10/10/2014 4:32 PM

## 2014-10-10 NOTE — Clinical Social Work Note (Signed)
Patient from [facility] and will discharge back to facility this evening. Discharge information transmitted to facility and patient will be transported by ambulance.  CSW contacted patient's mother Quayvon Gildersleeve and informed her of patient's discharge this evening back to facility. She expressed appreciation for CSW's call.  Genelle Bal, MSW, LCSW Licensed Clinical Social Worker Clinical Social Work Department [employer] [phone]

## 2014-10-11 ENCOUNTER — Non-Acute Institutional Stay (SKILLED_NURSING_FACILITY): Payer: Medicare Other | Admitting: Internal Medicine

## 2014-10-11 DIAGNOSIS — T827XXA Infection and inflammatory reaction due to other cardiac and vascular devices, implants and grafts, initial encounter: Secondary | ICD-10-CM | POA: Diagnosis not present

## 2014-10-11 DIAGNOSIS — M5442 Lumbago with sciatica, left side: Secondary | ICD-10-CM | POA: Diagnosis not present

## 2014-10-11 DIAGNOSIS — I509 Heart failure, unspecified: Secondary | ICD-10-CM | POA: Diagnosis not present

## 2014-10-11 DIAGNOSIS — B182 Chronic viral hepatitis C: Secondary | ICD-10-CM | POA: Diagnosis not present

## 2014-10-11 DIAGNOSIS — M5441 Lumbago with sciatica, right side: Secondary | ICD-10-CM | POA: Diagnosis not present

## 2014-10-11 DIAGNOSIS — N186 End stage renal disease: Secondary | ICD-10-CM | POA: Diagnosis not present

## 2014-10-11 DIAGNOSIS — I10 Essential (primary) hypertension: Secondary | ICD-10-CM

## 2014-10-11 DIAGNOSIS — Z992 Dependence on renal dialysis: Secondary | ICD-10-CM

## 2014-10-11 DIAGNOSIS — M4806 Spinal stenosis, lumbar region: Secondary | ICD-10-CM | POA: Diagnosis not present

## 2014-10-11 DIAGNOSIS — M48061 Spinal stenosis, lumbar region without neurogenic claudication: Secondary | ICD-10-CM

## 2014-10-11 LAB — HCV RNA NS5A DRUG RESISTANCE

## 2014-10-11 NOTE — Progress Notes (Signed)
Patient ID: [name], male   DOB: 27-Jun-1955, 59 y.o.   MRN: [id number]    HISTORY AND PHYSICAL   DATE: 10/11/14  Location:  [facility] [place]    Place of Service: SNF [phone])   Extended Emergency Contact Information Primary Emergency Contact: Canepa,[name] Address: [address]          [place], [place] [address] [place] of [place] Phone: [phone] Relation: Mother Secondary Emergency Contact: Sprague,[name]  [place] Phone: [phone] Relation: Sister  Advanced Directive information  FULL CODE  Chief Complaint  Patient presents with  . Readmit To SNF    HPI:  59 yo male seen today as a readmission into SNF following hospital stay for left thigh wound at AVG. He is s/p left thigh sx at AVG sit. He reports feeling better. A1c 5.4%. He has CTS pain and b/l weakness. He also reports back pain that is severe x few days. No falls or known injury. Pain worsens when he sits up in bed. No relief with oxycodone. He also has fentanyl patch and gabapentin.  He gets HD on MWF. No new SOB and HF controlled with HD. No CP ut has SLNTG prn if he needs it  Past Medical History  Diagnosis Date  . Anxiety   . Back pain   . GERD (gastroesophageal reflux disease)   . Peripheral neuropathy ([place])   . Arthritis   . Chronic diastolic CHF (congestive heart failure) ([place])     a. 04/2013 Echo: EF nl, no rwma, mild LVH.  Marland Kitchen Active smoker   . ESRD on hemodialysis ([place])     [name] 4 days per week on M-Tu-Wed and Fri.  Started HD in 1998 and has been on HD initially at [facility], then went to [facility], then in [facility], then to [facility] and now is at [facility] for last 10 years.  Has L thigh AVG.     . Diabetes mellitus     diet controlled  . 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 PONV (postoperative nausea and vomiting)   . Hypertension   . Bell palsy   . Carpal tunnel syndrome     bilateral  . Palpitations   . Headache(784.0)      Hx: of Migraines  . CAD (coronary artery disease)     a. 04/2013 Ant STEMI/PCI: LM nl, LAD 95p (3.0x18 Xience DES), D1/2/3 small, LCX 80ost, RI 20p, RCA 127m CTO (unsuccessful PCI), EF 55%, mod basal inf HK.  . S/P coronary artery stent placement 05/21/2013    Xience Alpine Medtronic conditional 5 @ 1.5 and 3T     Past Surgical History  Procedure Laterality Date  . Total hip arthroplasty    . Thyroidectomy    . Tooth extraction    . Mandible fracture surgery    . Dg av dialysis graft declot or    . Insertion of dialysis catheter  03/28/2011    Procedure: INSERTION OF DIALYSIS CATHETER;  Surgeon: Mal Misty, MD;  Location: [place];  Service: Vascular;  Laterality: Left;  . Av fistula placement  03/31/2011    Procedure: INSERTION OF ARTERIOVENOUS (AV) GORE-TEX GRAFT THIGH;  Surgeon: Elam Dutch, MD;  Location: [facility] OR;  Service: Vascular;  Laterality: Right;  redo right thigh arteriovenous gortex graft using gore-tex stretch 30mm x 70cm  . Thrombectomy w/ embolectomy  06/02/2011  Procedure: THROMBECTOMY ARTERIOVENOUS GORE-TEX GRAFT;  Surgeon: Angelia Mould, MD;  Location: [place];  Service: Vascular;  Laterality: Right;  Thrombectomy right thigh arteriovenous gortex graft;  revision by  replacement of large portion of graft with 101mm gore-tex   . Insertion of dialysis catheter  08/28/2011    Procedure: INSERTION OF DIALYSIS CATHETER;  Surgeon: Conrad Jonesville, MD;  Location: [place];  Service: Vascular;  Laterality: Left;  Atempted Bilateral Internal Jugular, Bilateral Subclavin insertion of 55cm Dialysis Catheter Left Femoral  . Insertion of dialysis catheter  12/15/2011    Procedure: INSERTION OF DIALYSIS CATHETER;  Surgeon: Elam Dutch, MD;  Location: [place];  Service: Vascular;  Laterality: Right;  Insertion of Right Femoral Dialysis Catheter  . Exchange of a dialysis catheter  02/26/2012    Procedure: EXCHANGE OF A DIALYSIS CATHETER;  Surgeon: Rosetta Posner, MD;  Location: [place];   Service: Vascular;  Laterality: Right;  . Joint replacement    . Exchange of a dialysis catheter  05/18/2012    Procedure: EXCHANGE OF A DIALYSIS CATHETER;  Surgeon: Rosetta Posner, MD;  Location: [place];  Service: Vascular;  Laterality: Right;  right femoral dialysis catheter  . Av fistula placement  05/18/2012    Procedure: INSERTION OF ARTERIOVENOUS (AV) GORE-TEX GRAFT THIGH;  Surgeon: Rosetta Posner, MD;  Location: [facility] OR;  Service: Vascular;  Laterality: Left;  using 30mm by 70cm goretex graft  . Thrombectomy w/ embolectomy Left 08/25/2012    Procedure: THROMBECTOMY ARTERIOVENOUS GORE-TEX GRAFT;  Surgeon: Mal Misty, MD;  Location: [place];  Service: Vascular;  Laterality: Left;  Attempted thrombectomy of left thigh arteriovenous gortex graft.   . Av fistula placement Left 08/25/2012    Procedure: INSERTION OF ARTERIOVENOUS (AV) GORE-TEX GRAFT THIGH;  Surgeon: Mal Misty, MD;  Location: [name] OR;  Service: Vascular;  Laterality: Left;  Using 23mm x 40cm vascular Gortex graft.  . Revision of arteriovenous goretex graft Left 12/14/2012    Procedure: Revision of Left Thigh Graft;  Surgeon: Rosetta Posner, MD;  Location: [place];  Service: Vascular;  Laterality: Left;  . Avgg removal Left 02/16/2013    Procedure: EXCISION OF LEFT ARM ARTERIOVENOUS GORETEX GRAFT TIMES 2 WITH VEIN PATCH ANGIOPLASTY OF BRACIAL ARTERY.  ;  Surgeon: Angelia Mould, MD;  Location: [place];  Service: Vascular;  Laterality: Left;  Converted from [place] to [facility].    . Revision of arteriovenous goretex graft Left 08/08/2013    Procedure: REVISION OF LEFT THIGH ARTERIOVENOUS GORETEX GRAFT;  Surgeon: Elam Dutch, MD;  Location: [place];  Service: Vascular;  Laterality: Left;  . Venogram Bilateral 10/27/2011    Procedure: VENOGRAM;  Surgeon: Serafina Mitchell, MD;  Location: [facility] CATH LAB;  Service: Cardiovascular;  Laterality: Bilateral;  bilat upper extrem venograms  . Left heart cath Bilateral 05/21/2013    Procedure: LEFT HEART CATH;   Surgeon: Wellington Hampshire, MD;  Location: [facility] CATH LAB;  Service: Cardiovascular;  Laterality: Bilateral;  . Percutaneous coronary stent intervention (pci-s)  05/21/2013    Procedure: PERCUTANEOUS CORONARY STENT INTERVENTION (PCI-S);  Surgeon: Wellington Hampshire, MD;  Location: [facility] CATH LAB;  Service: Cardiovascular;;  . Coronary angioplasty with stent placement    . Radiology with anesthesia N/A 06/27/2014    Procedure: MRI LUMBER WITHOUT CONTRAST;  Surgeon: Medication Radiologist, MD;  Location: [place];  Service: Radiology;  Laterality: N/A;  . Revision of arteriovenous goretex graft Left 08/13/2014  Procedure: REVISION OF ARTERIOVENOUS GORETEX GRAFT LEFT THIGH;  Surgeon: Sherren Kerns, MD;  Location: [facility] OR;  Service: Vascular;  Laterality: Left;  . Radiology with anesthesia N/A 08/16/2014    Procedure: MRI;  Surgeon: Medication Radiologist, MD;  [facility];  Service: Radiology;  Laterality: N/A;  . Thrombectomy w/ embolectomy Left 08/22/2014    Procedure: THROMBECTOMY ARTERIOVENOUS GORE-TEX GRAFT Of Left Thigh Graft;  Surgeon: Larina Earthly, MD;  Location: [facility] OR;  Service: Vascular;  Laterality: Left;  . Peripheral vascular catheterization N/A 10/09/2014    Procedure: Shuntogram;  Surgeon: Nada Libman, MD;  Location: [facility] INVASIVE CV LAB;  Service: Cardiovascular;  Laterality: N/A;  . Av fistula placement Left 10/30/2014    Procedure: INSERTION OF LEFT THIGH GRAFT;  Surgeon: Nada Libman, MD;  Location: [facility] OR;  Service: Vascular;  Laterality: Left;  . Removal of graft Left 10/30/2014    Procedure: REMOVAL OF LEFT THIGH GRAFT;  Surgeon: Nada Libman, MD;  Location: [facility] OR;  Service: Vascular;  Laterality: Left;  . Peripheral vascular catheterization Left 01/01/2015    Procedure:  Shuntogram;  Surgeon: Nada Libman, MD;  [facility] INVASIVE CV LAB;  Service: Cardiovascular;  Laterality: Left;    Patient Care Team: Primitivo Gauze, MD as PCP - General (Nephrology) Primitivo Gauze, MD  as Consulting Physician (Nephrology) [name] Sicks, MD (Neurosurgery) Kirt Boys, DO as Consulting Physician (Internal Medicine)  Social History   Social History  . Marital Status: Single    Spouse Name: N/A  . Number of Children: N/A  . Years of Education: N/A   Occupational History  . Not on file.   Social History Main Topics  . Smoking status: Current Every Day Smoker -- 0.20 packs/day for 40 years    Types: Cigarettes  . Smokeless tobacco: Never Used  . Alcohol Use: No  . Drug Use: No  . Sexual Activity: No   Other Topics Concern  . Not on file   Social History Narrative     reports that he has been smoking Cigarettes.  He has a 8 pack-year smoking history. He has never used smokeless tobacco. He reports that he does not drink alcohol or use illicit drugs.  Family History  Problem Relation Age of Onset  . Diabetes Mother   . Stroke Mother   . Heart disease Mother   . Kidney disease Father   . Hyperlipidemia Father    Family Status  Relation Status Death Age  . Mother Alive   . Father Deceased      There is no immunization history on file for this patient.  No Known Allergies  Medications: Patient's Medications  New Prescriptions   MULTIVITAMIN (RENA-VIT) TABS TABLET    Take 1 tablet by mouth at bedtime.  Previous Medications   AMINO ACIDS-PROTEIN HYDROLYS (FEEDING SUPPLEMENT, PRO-STAT SUGAR FREE 64,) LIQD    Take 30 mLs by mouth 2 (two) times daily.   ASPIRIN 81 MG TABLET    Take 1 tablet (81 mg total) by mouth daily.   ATORVASTATIN (LIPITOR) 80 MG TABLET    Take 1 tablet (80 mg total) by mouth daily at 6 PM.   B COMPLEX-C (B-COMPLEX WITH VITAMIN C) TABLET    Take 1 tablet by mouth at bedtime.   CALCIUM CARBONATE (TUMS - DOSED IN MG ELEMENTAL CALCIUM) 500 MG CHEWABLE TABLET    Chew 3 tablets by mouth 3 (three) times daily.   CLOPIDOGREL (PLAVIX) 75 MG TABLET  Take 1 tablet (75 mg total) by mouth daily.   COLCHICINE 0.6 MG TABLET    Take 0.6 mg by  mouth every 12 (twelve) hours as needed (gout).    DORZOLAMIDE-TIMOLOL (COSOPT) 22.3-6.8 MG/ML OPHTHALMIC SOLUTION    Place 1 drop into both eyes 3 (three) times daily.   ESCITALOPRAM (LEXAPRO) 10 MG TABLET    Take 10 mg by mouth daily.   FENTANYL (DURAGESIC - DOSED MCG/HR) 50 MCG/HR    Apply one patch topically every 72 hours for pain. Remove old patch   GABAPENTIN (NEURONTIN) 100 MG CAPSULE    Take 100 mg by mouth every 12 (twelve) hours as needed (neuropathy).    LATANOPROST (XALATAN) 0.005 % OPHTHALMIC SOLUTION    Place 1 drop into both eyes at bedtime.    MENTHOL, TOPICAL ANALGESIC, 4 % GEL    Apply 1 application topically every 6 (six) hours as needed (pain). Apply to shoulders/lower back   NITROGLYCERIN (NITROSTAT) 0.4 MG SL TABLET    Place 1 tablet (0.4 mg total) under the tongue every 5 (five) minutes as needed for chest pain.   SULINDAC (CLINORIL) 200 MG TABLET    Take 200 mg by mouth 2 (two) times daily.  Modified Medications   No medications on file  Discontinued Medications   LOPERAMIDE (IMODIUM A-D) 2 MG TABLET    Take 4 mg by mouth every 4 (four) hours as needed for diarrhea or loose stools.   LORAZEPAM (ATIVAN) 0.5 MG TABLET    Take 1 tablet (0.5 mg total) by mouth every 8 (eight) hours as needed for anxiety.   OMEPRAZOLE (PRILOSEC) 40 MG CAPSULE    Take 40 mg by mouth at bedtime.    OXYCODONE (OXY IR/ROXICODONE) 5 MG IMMEDIATE RELEASE TABLET    Take 1-2 tablets (5-10 mg total) by mouth every 4 (four) hours as needed for moderate pain.   SODIUM BICARBONATE 650 MG TABLET    Take 650 mg by mouth 2 (two) times daily.     VANCOMYCIN (VANCOCIN) 1 GM/200ML SOLN    Inject 200 mLs (1,000 mg total) into the vein every dialysis.    Review of Systems  Constitutional: Negative for chills, activity change and fatigue.  HENT: Negative for sore throat and trouble swallowing.   Eyes: Negative for visual disturbance.  Respiratory: Negative for cough, chest tightness and shortness of breath.     Cardiovascular: Negative for chest pain, palpitations and leg swelling.  Gastrointestinal: Negative for nausea, vomiting, abdominal pain and blood in stool.  Genitourinary: Negative for urgency, frequency and difficulty urinating.  Musculoskeletal: Positive for back pain, joint swelling, arthralgias and gait problem.  Skin: Positive for wound. Negative for rash.  Neurological: Positive for weakness. Negative for headaches.  Psychiatric/Behavioral: Negative for confusion and sleep disturbance. The patient is not nervous/anxious.     Filed Vitals:   10/11/14 1527  BP: 119/92  Pulse: 84  Temp: 99.6 F (37.6 C)   There is no weight on file to calculate BMI.  Physical Exam  Constitutional: He is oriented to person, place, and time. He appears well-developed and well-nourished. No distress.  HENT:  Mouth/Throat: Oropharynx is clear and moist.  Eyes: Pupils are equal, round, and reactive to light. No scleral icterus.  Neck: Neck supple. Carotid bruit is not present. No thyromegaly present.  Cardiovascular: Normal rate, regular rhythm and intact distal pulses.  Exam reveals no gallop and no friction rub.   Murmur (1/6 SEM) heard. Trace LLE edema. Left anteriior thigh  audible bruit/palpable thrill; bandage intact  Pulmonary/Chest: Effort normal and breath sounds normal. He has no wheezes. He has no rales. He exhibits no tenderness.  Abdominal: Soft. Bowel sounds are normal. He exhibits no distension, no abdominal bruit, no pulsatile midline mass and no mass. There is no tenderness. There is no rebound and no guarding.  Musculoskeletal: He exhibits edema and tenderness.  Lymphadenopathy:    He has no cervical adenopathy.  Neurological: He is alert and oriented to person, place, and time.  Skin: Skin is warm and dry. No rash noted.  Left thigh dsg c/d/i. NT and no d/c  Psychiatric: He has a normal mood and affect. His behavior is normal.     Labs reviewed: Admission on 10/07/2014,  Discharged on 10/10/2014  Component Date Value Ref Range Status  . WBC 10/07/2014 13.4* 4.0 - 10.5 K/uL Final  . RBC 10/07/2014 2.59* 4.22 - 5.81 MIL/uL Final  . Hemoglobin 10/07/2014 7.5* 13.0 - 17.0 g/dL Final  . HCT 10/07/2014 24.0* 39.0 - 52.0 % Final  . MCV 10/07/2014 92.7  78.0 - 100.0 fL Final  . MCH 10/07/2014 29.0  26.0 - 34.0 pg Final  . MCHC 10/07/2014 31.3  30.0 - 36.0 g/dL Final  . RDW 10/07/2014 16.6* 11.5 - 15.5 % Final  . Platelets 10/07/2014 391  150 - 400 K/uL Final  . Sodium 10/07/2014 136  135 - 145 mmol/L Final  . Potassium 10/07/2014 5.2* 3.5 - 5.1 mmol/L Final  . Chloride 10/07/2014 97* 101 - 111 mmol/L Final  . CO2 10/07/2014 25  22 - 32 mmol/L Final  . Glucose, Bld 10/07/2014 74  65 - 99 mg/dL Final  . BUN 10/07/2014 57* 6 - 20 mg/dL Final  . Creatinine, Ser 10/07/2014 8.42* 0.61 - 1.24 mg/dL Final  . Calcium 10/07/2014 7.4* 8.9 - 10.3 mg/dL Final  . Total Protein 10/07/2014 6.5  6.5 - 8.1 g/dL Final  . Albumin 10/07/2014 2.2* 3.5 - 5.0 g/dL Final  . AST 10/07/2014 60* 15 - 41 U/L Final  . ALT 10/07/2014 19  17 - 63 U/L Final  . Alkaline Phosphatase 10/07/2014 181* 38 - 126 U/L Final  . Total Bilirubin 10/07/2014 1.2  0.3 - 1.2 mg/dL Final  . GFR calc non Af Amer 10/07/2014 6* >60 mL/min Final  . GFR calc Af Amer 10/07/2014 7* >60 mL/min Final   Comment: (NOTE) The eGFR has been calculated using the CKD EPI equation. This calculation has not been validated in all clinical situations. eGFR's persistently <60 mL/min signify possible Chronic Kidney Disease.   . Anion gap 10/07/2014 14  5 - 15 Final  . Prothrombin Time 10/07/2014 15.9* 11.6 - 15.2 seconds Final  . INR 10/07/2014 1.26  0.00 - 1.49 Final  . aPTT 10/07/2014 34  24 - 37 seconds Final  . ABO/RH(D) 10/07/2014 A POS   Final  . Antibody Screen 10/07/2014 NEG   Final  . Sample Expiration 10/07/2014 10/10/2014   Final  . Unit Number 10/07/2014 [id number]   Final  . Blood Component Type  10/07/2014 RED CELLS,LR   Final  . Unit division 10/07/2014 00   Final  . Status of Unit 10/07/2014 ISSUED,FINAL   Final  . Transfusion Status 10/07/2014 OK TO TRANSFUSE   Final  . Crossmatch Result 10/07/2014 Compatible   Final  . Unit Number 10/07/2014 [id number]   Final  . Blood Component Type 10/07/2014 RED CELLS,LR   Final  . Unit division 10/07/2014 00   Final  .  Status of Unit 10/07/2014 ISSUED,FINAL   Final  . Transfusion Status 10/07/2014 OK TO TRANSFUSE   Final  . Crossmatch Result 10/07/2014 Compatible   Final  . Glucose-Capillary 10/07/2014 80  65 - 99 mg/dL Final  . MRSA by PCR 10/07/2014 NEGATIVE  NEGATIVE Final   Comment:        The GeneXpert MRSA Assay (FDA approved for NASAL specimens only), is one component of a comprehensive MRSA colonization surveillance program. It is not intended to diagnose MRSA infection nor to guide or monitor treatment for MRSA infections.   . Vancomycin Tr 10/07/2014 10  10.0 - 20.0 ug/mL Final  . Glucose-Capillary 10/07/2014 87  65 - 99 mg/dL Final  . Order Confirmation 10/08/2014 ORDER PROCESSED BY BLOOD BANK   Final  . Glucose-Capillary 10/08/2014 78  65 - 99 mg/dL Final  . Sodium 10/08/2014 136  135 - 145 mmol/L Final  . Potassium 10/08/2014 5.5* 3.5 - 5.1 mmol/L Final  . Chloride 10/08/2014 99* 101 - 111 mmol/L Final  . CO2 10/08/2014 25  22 - 32 mmol/L Final  . Glucose, Bld 10/08/2014 74  65 - 99 mg/dL Final  . BUN 10/08/2014 69* 6 - 20 mg/dL Final  . Creatinine, Ser 10/08/2014 9.13* 0.61 - 1.24 mg/dL Final  . Calcium 10/08/2014 7.0* 8.9 - 10.3 mg/dL Final  . Phosphorus 10/08/2014 6.0* 2.5 - 4.6 mg/dL Final  . Albumin 10/08/2014 2.2* 3.5 - 5.0 g/dL Final  . GFR calc non Af Amer 10/08/2014 6* >60 mL/min Final  . GFR calc Af Amer 10/08/2014 6* >60 mL/min Final   Comment: (NOTE) The eGFR has been calculated using the CKD EPI equation. This calculation has not been validated in all clinical situations. eGFR's persistently <60  mL/min signify possible Chronic Kidney Disease.   . Anion gap 10/08/2014 12  5 - 15 Final  . WBC 10/08/2014 17.9* 4.0 - 10.5 K/uL Final  . RBC 10/08/2014 1.89* 4.22 - 5.81 MIL/uL Final  . Hemoglobin 10/08/2014 5.5* 13.0 - 17.0 g/dL Final   Comment: REPEATED TO VERIFY CRITICAL RESULT CALLED TO, READ BACK BY AND VERIFIED WITH: GARNER,S RN @ 0926 10/08/14 LEONARD,A   . HCT 10/08/2014 17.0* 39.0 - 52.0 % Final  . MCV 10/08/2014 89.9  78.0 - 100.0 fL Final  . MCH 10/08/2014 29.1  26.0 - 34.0 pg Final  . MCHC 10/08/2014 32.4  30.0 - 36.0 g/dL Final  . RDW 10/08/2014 16.4* 11.5 - 15.5 % Final  . Platelets 10/08/2014 285  150 - 400 K/uL Final  . ALT 10/08/2014 19  17 - 63 U/L Final  . AST 10/08/2014 58* 15 - 41 U/L Final  . Total Bilirubin 10/08/2014 1.1  0.3 - 1.2 mg/dL Final  . Total Protein 10/08/2014 6.5  6.5 - 8.1 g/dL Final  . WBC 10/09/2014 17.4* 4.0 - 10.5 K/uL Final  . RBC 10/09/2014 2.84* 4.22 - 5.81 MIL/uL Final  . Hemoglobin 10/09/2014 8.3* 13.0 - 17.0 g/dL Final  . HCT 10/09/2014 25.5* 39.0 - 52.0 % Final  . MCV 10/09/2014 89.8  78.0 - 100.0 fL Final  . MCH 10/09/2014 29.2  26.0 - 34.0 pg Final  . MCHC 10/09/2014 32.5  30.0 - 36.0 g/dL Final  . RDW 10/09/2014 16.7* 11.5 - 15.5 % Final  . Platelets 10/09/2014 245  150 - 400 K/uL Final  . Sodium 10/09/2014 136  135 - 145 mmol/L Final  . Potassium 10/09/2014 4.0  3.5 - 5.1 mmol/L Final   DELTA CHECK  NOTED  . Chloride 10/09/2014 95* 101 - 111 mmol/L Final  . CO2 10/09/2014 28  22 - 32 mmol/L Final  . Glucose, Bld 10/09/2014 83  65 - 99 mg/dL Final  . BUN 10/09/2014 33* 6 - 20 mg/dL Final  . Creatinine, Ser 10/09/2014 5.62* 0.61 - 1.24 mg/dL Final   DELTA CHECK NOTED  . Calcium 10/09/2014 7.5* 8.9 - 10.3 mg/dL Final  . GFR calc non Af Amer 10/09/2014 10* >60 mL/min Final  . GFR calc Af Amer 10/09/2014 12* >60 mL/min Final   Comment: (NOTE) The eGFR has been calculated using the CKD EPI equation. This calculation has not  been validated in all clinical situations. eGFR's persistently <60 mL/min signify possible Chronic Kidney Disease.   . Anion gap 10/09/2014 13  5 - 15 Final  . WBC 10/08/2014 19.5* 4.0 - 10.5 K/uL Final  . RBC 10/08/2014 2.91* 4.22 - 5.81 MIL/uL Final  . Hemoglobin 10/08/2014 8.4* 13.0 - 17.0 g/dL Final   REPEATED TO VERIFY  . HCT 10/08/2014 25.9* 39.0 - 52.0 % Final  . MCV 10/08/2014 89.0  78.0 - 100.0 fL Final  . MCH 10/08/2014 28.9  26.0 - 34.0 pg Final  . MCHC 10/08/2014 32.4  30.0 - 36.0 g/dL Final  . RDW 10/08/2014 16.7* 11.5 - 15.5 % Final  . Platelets 10/08/2014 256  150 - 400 K/uL Final  . Vancomycin Rm 10/10/2014 28   Final   Comment:        Random Vancomycin therapeutic range is dependent on dosage and time of specimen collection. A peak range is 20.0-40.0 ug/mL A trough range is 5.0-15.0 ug/mL          . WBC 10/10/2014 14.2* 4.0 - 10.5 K/uL Final  . RBC 10/10/2014 2.70* 4.22 - 5.81 MIL/uL Final  . Hemoglobin 10/10/2014 7.8* 13.0 - 17.0 g/dL Final  . HCT 10/10/2014 24.5* 39.0 - 52.0 % Final  . MCV 10/10/2014 90.7  78.0 - 100.0 fL Final  . MCH 10/10/2014 28.9  26.0 - 34.0 pg Final  . MCHC 10/10/2014 31.8  30.0 - 36.0 g/dL Final  . RDW 10/10/2014 16.6* 11.5 - 15.5 % Final  . Platelets 10/10/2014 281  150 - 400 K/uL Final  . Sodium 10/10/2014 138  135 - 145 mmol/L Final  . Potassium 10/10/2014 4.6  3.5 - 5.1 mmol/L Final  . Chloride 10/10/2014 97* 101 - 111 mmol/L Final  . CO2 10/10/2014 29  22 - 32 mmol/L Final  . Glucose, Bld 10/10/2014 85  65 - 99 mg/dL Final  . BUN 10/10/2014 48* 6 - 20 mg/dL Final  . Creatinine, Ser 10/10/2014 7.31* 0.61 - 1.24 mg/dL Final  . Calcium 10/10/2014 7.6* 8.9 - 10.3 mg/dL Final  . GFR calc non Af Amer 10/10/2014 7* >60 mL/min Final  . GFR calc Af Amer 10/10/2014 8* >60 mL/min Final   Comment: (NOTE) The eGFR has been calculated using the CKD EPI equation. This calculation has not been validated in all clinical situations. eGFR's  persistently <60 mL/min signify possible Chronic Kidney Disease.   . Anion gap 10/10/2014 12  5 - 15 Final  Office Visit on 09/26/2014  Component Date Value Ref Range Status  . HCV NS5A Subtype 09/26/2014 1a   Final  . Daclatasvir Resistance 09/26/2014 NOT PREDICTED   Final  . Ledipasvir Resistance 09/26/2014 NOT PREDICTED   Final  . Ombitasvir Resistance 09/26/2014 NOT PREDICTED   Final  . Elbasvir Resistance 09/26/2014 NOT PREDICTED  Final   Comment: Mutations Detected: NONE   REFERENCE RANGE:     HCV NS5a SUBTYPE: NOT DETECTED   This assay is designed to amplify HCV genotypes 1a and 1b and may not successfully amplify other HCV genotypes.   This test utilizes RT-PCR and DNA sequencing to detect the presence of treatment-emergent HCV NS5a variants associated with NS5a inhibitor antiviral therapy.   The clinical significance of NS5a resistance associated variants for antiviral therapy may vary according to the clinical status and antiviral treatment experience of the HCV-infected patient.   For further guidance consult with the package inserts of the applicable direct acting agents and guideline documents such as the AASLD and IDSA guidelines available at http://hcvguidelines.org   For additional information please refer to http://education.[facility].com/faq/FAQ173   This test was developed and its analytical performance characteristics have been determined by [facility]. It has not been clea                          red or approved by the FDA. This assay has been validated pursuant to the CLIA regulations and is used for clinical purposes.     Marland Kitchen HIV 1&2 Ab, 4th Generation 09/26/2014 NONREACTIVE  NONREACTIVE Final   Comment:   HIV-1 antigen and HIV-1/HIV-2 antibodies were not detected.  There is no laboratory evidence of HIV infection.   HIV-1/2 Antibody Diff        Not indicated. HIV-1 RNA, Qual TMA          Not indicated.     PLEASE NOTE: This  information has been disclosed to you from records whose confidentiality may be protected by state law. If your state requires such protection, then the state law prohibits you from making any further disclosure of the information without the specific written consent of the person to whom it pertains, or as otherwise permitted by law. A general authorization for the release of medical or other information is NOT sufficient for this purpose.   The performance of this assay has not been clinically validated in patients less than 10 years old.   For additional information please refer to http://education.questdiagnostics.com/faq/FAQ106.  (This link is being provided for informational/educational purposes only.)     . Prothrombin Time 09/26/2014 14.2  11.6 - 15.2 seconds Final  . INR 09/26/2014 1.10  <1.50 Final   Comment: The INR is of principal utility in following patients on stable doses of oral anticoagulants.  The therapeutic range is generally 2.0 to 3.0, but may be 3.0 to 4.0 in patients with mechanical cardiac valves, recurrent embolisms and antiphospholipid antibodies (including lupus inhibitors).   . Sodium 09/26/2014 138  135 - 145 mEq/L Final  . Potassium 09/26/2014 4.6  3.5 - 5.3 mEq/L Final  . Chloride 09/26/2014 95* 96 - 112 mEq/L Final  . CO2 09/26/2014 28  19 - 32 mEq/L Final  . Glucose, Bld 09/26/2014 92  70 - 99 mg/dL Final  . BUN 09/26/2014 41* 6 - 23 mg/dL Final  . Creat 09/26/2014 5.62* 0.50 - 1.35 mg/dL Final  . Total Bilirubin 09/26/2014 1.6* 0.2 - 1.2 mg/dL Final  . Alkaline Phosphatase 09/26/2014 171* 39 - 117 U/L Final  . AST 09/26/2014 62* 0 - 37 U/L Final  . ALT 09/26/2014 22  0 - 53 U/L Final  . Total Protein 09/26/2014 7.5  6.0 - 8.3 g/dL Final  . Albumin 09/26/2014 3.3* 3.5 - 5.2 g/dL Final  . Calcium 09/26/2014 8.3* 8.4 -  10.5 mg/dL Final  . GFR, Est African American 09/26/2014 12*  Final  . GFR, Est Non African American 09/26/2014 10*  Final    Comment:   The estimated GFR is a calculation valid for adults (>=39 years old) that uses the CKD-EPI algorithm to adjust for age and sex. It is   not to be used for children, pregnant women, hospitalized patients,    patients on dialysis, or with rapidly changing kidney function. According to the NKDEP, eGFR >89 is normal, 60-89 shows mild impairment, 30-59 shows moderate impairment, 15-29 shows severe impairment and <15 is ESRD.     . WBC 09/26/2014 11.2* 4.0 - 10.5 K/uL Final  . RBC 09/26/2014 3.07* 4.22 - 5.81 MIL/uL Final  . Hemoglobin 09/26/2014 9.1* 13.0 - 17.0 g/dL Final  . HCT 09/26/2014 29.0* 39.0 - 52.0 % Final  . MCV 09/26/2014 94.5  78.0 - 100.0 fL Final  . MCH 09/26/2014 29.6  26.0 - 34.0 pg Final  . MCHC 09/26/2014 31.4  30.0 - 36.0 g/dL Final  . RDW 09/26/2014 15.8* 11.5 - 15.5 % Final  . Platelets 09/26/2014 206  150 - 400 K/uL Final  . MPV 09/26/2014 10.8  8.6 - 12.4 fL Final  . Neutrophils Relative % 09/26/2014 76  43 - 77 % Final  . Neutro Abs 09/26/2014 8.5* 1.7 - 7.7 K/uL Final  . Lymphocytes Relative 09/26/2014 13  12 - 46 % Final  . Lymphs Abs 09/26/2014 1.5  0.7 - 4.0 K/uL Final  . Monocytes Relative 09/26/2014 7  3 - 12 % Final  . Monocytes Absolute 09/26/2014 0.8  0.1 - 1.0 K/uL Final  . Eosinophils Relative 09/26/2014 4  0 - 5 % Final  . Eosinophils Absolute 09/26/2014 0.4  0.0 - 0.7 K/uL Final  . Basophils Relative 09/26/2014 0  0 - 1 % Final  . Basophils Absolute 09/26/2014 0.0  0.0 - 0.1 K/uL Final  . Smear Review 09/26/2014 Criteria for review not met   Final  Admission on 08/10/2014, Discharged on 08/23/2014  No results displayed because visit has over 200 results.      No results found.   Assessment/Plan   ICD-9-CM ICD-10-CM   1. Bilateral low back pain with sciatica, sciatica laterality unspecified - prob due to #4; uncontrolled pain 724.3 M54.41     M54.42   2. Infection and inflammatory reaction due to cardiac device, implant, and graft,  initial encounter ([place]) - improving 996.61 T82.7XXA   3. End-stage renal disease on hemodialysis (HCC) MWF 585.6 N18.6    V45.11 Z99.2   4. Spinal stenosis of lumbar region 724.02 M48.06   5. Essential hypertension - stable 401.9 I10   6. Chronic heart failure, unspecified heart failure type (HCC) - stable 428.9 I50.9   7. Chronic hepatitis C without hepatic coma (HCC) - stable 070.54 B18.2     Check lumbar spine xray  Cont current meds as ordered  F/u with HD as scheduled MWF via left anterior thigh AVG  F/u with specialists as scheduled  PT/OT as ordered  Wound care to follow as indicated  GOAL: short term rehab and d/c home when medically appropriate. Communicated with pt and nursing.  Huan Pollok S. Perlie Gold  [facility] [address] [place],  [address] [phone] Cell (Monday-Friday 8 AM - 5 PM) [phone] After 5 PM and follow prompts

## 2014-10-18 ENCOUNTER — Encounter: Payer: Self-pay | Admitting: Adult Health

## 2014-10-18 ENCOUNTER — Non-Acute Institutional Stay (SKILLED_NURSING_FACILITY): Payer: Medicare Other | Admitting: Adult Health

## 2014-10-18 DIAGNOSIS — Z992 Dependence on renal dialysis: Secondary | ICD-10-CM

## 2014-10-18 DIAGNOSIS — M5441 Lumbago with sciatica, right side: Secondary | ICD-10-CM | POA: Diagnosis not present

## 2014-10-18 DIAGNOSIS — I5022 Chronic systolic (congestive) heart failure: Secondary | ICD-10-CM

## 2014-10-18 DIAGNOSIS — M5442 Lumbago with sciatica, left side: Secondary | ICD-10-CM

## 2014-10-18 DIAGNOSIS — N186 End stage renal disease: Secondary | ICD-10-CM

## 2014-10-18 DIAGNOSIS — B182 Chronic viral hepatitis C: Secondary | ICD-10-CM | POA: Diagnosis not present

## 2014-10-18 DIAGNOSIS — M5410 Radiculopathy, site unspecified: Secondary | ICD-10-CM | POA: Diagnosis not present

## 2014-10-18 NOTE — Progress Notes (Signed)
Patient ID: [name], male   DOB: 1956/01/12, 59 y.o.   MRN: [id number]  [facility]     No Known Allergies     Chief Complaint  Patient presents with  . Discharge Note    HPI:  He is being discharged to home with home for pt/ot to improve upon his strength; gait and adls retraining. He will need a wheelchair; hospital bed; and 3:1 commode. He will need his prescriptions to be written and will need to follow up with his pcp.    Past Medical History  Diagnosis Date  . Anxiety   . Back pain   . GERD (gastroesophageal reflux disease)   . Peripheral neuropathy   . Arthritis   . Chronic diastolic CHF (congestive heart failure)     a. 04/2013 Echo: EF nl, no rwma, mild LVH.  Marland Kitchen Active smoker   . ESRD on hemodialysis     [name] 4 days per week on M-Tu-Wed and Fri.  Started HD in 1998 and has been on HD initially at [facility], then went to [facility], then in [facility], then to [facility] and now is at [facility] for last 10 years.  Has L thigh AVG.     . Diabetes mellitus     diet controlled  . 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 PONV (postoperative nausea and vomiting)   . Hypertension   . Bell palsy   . Carpal tunnel syndrome     bilateral  . Palpitations   . Headache(784.0)     Hx: of Migraines  . CAD (coronary artery disease)     a. 04/2013 Ant STEMI/PCI: LM nl, LAD 95p (3.0x18 Xience DES), D1/2/3 small, LCX 80ost, RI 20p, RCA 185mCTO (unsuccessful PCI), EF 55%, mod basal inf HK.  . S/P coronary artery stent placement 05/21/2013    Xience Alpine Medtronic conditional 5 @ 1.5 and 3T     Past Surgical History  Procedure Laterality Date  . Total hip arthroplasty    . Thyroidectomy    . Tooth extraction    . Mandible fracture surgery    . Dg av dialysis graft declot or    . Insertion of dialysis catheter  03/28/2011    Procedure: INSERTION OF DIALYSIS CATHETER;  Surgeon: JMal Misty MD;  [facility]  Service: Vascular;  Laterality:  Left;  . Av fistula placement  03/31/2011    Procedure: INSERTION OF ARTERIOVENOUS (AV) GORE-TEX GRAFT THIGH;  Surgeon: CElam Dutch MD;  Location: [facility] OR;  Service: Vascular;  Laterality: Right;  redo right thigh arteriovenous gortex graft using gore-tex stretch 6622mx 70cm  . Thrombectomy w/ embolectomy  06/02/2011    Procedure: THROMBECTOMY ARTERIOVENOUS GORE-TEX GRAFT;  Surgeon: ChAngelia MouldMD;  [facility] Service: Vascular;  Laterality: Right;  Thrombectomy right thigh arteriovenous gortex graft;  revision by  replacement of large portion of graft with 22m28more-tex   . Insertion of dialysis catheter  08/28/2011    Procedure: INSERTION OF DIALYSIS CATHETER;  Surgeon: BriConrad BurlingtonD;  [facility] [place];  Laterality: Left;  Atempted Bilateral Internal Jugular, Bilateral Subclavin insertion of 55cm Dialysis Catheter Left Femoral  . Insertion of dialysis catheter  12/15/2011    Procedure: INSERTION OF DIALYSIS CATHETER;  Surgeon: ChaElam DutchD;  [facility] [place];  Laterality: Right;  Insertion of Right Femoral  Dialysis Catheter  . Exchange of a dialysis catheter  02/26/2012    Procedure: EXCHANGE OF A DIALYSIS CATHETER;  Surgeon: Rosetta Posner, MD;  Location: [place];  Service: Vascular;  Laterality: Right;  . Joint replacement    . Exchange of a dialysis catheter  05/18/2012    Procedure: EXCHANGE OF A DIALYSIS CATHETER;  Surgeon: Rosetta Posner, MD;  Location: [place];  Service: Vascular;  Laterality: Right;  right femoral dialysis catheter  . Av fistula placement  05/18/2012    Procedure: INSERTION OF ARTERIOVENOUS (AV) GORE-TEX GRAFT THIGH;  Surgeon: Rosetta Posner, MD;  Location: [facility] OR;  Service: Vascular;  Laterality: Left;  using 72m by 70cm goretex graft  . Thrombectomy w/ embolectomy Left 08/25/2012    Procedure: THROMBECTOMY ARTERIOVENOUS GORE-TEX GRAFT;  Surgeon: JMal Misty MD;  [place]  Service: Vascular;  Laterality: Left;   Attempted thrombectomy of left thigh arteriovenous gortex graft.   . Av fistula placement Left 08/25/2012    Procedure: INSERTION OF ARTERIOVENOUS (AV) GORE-TEX GRAFT THIGH;  Surgeon: JMal Misty MD;  [facility]  Service: Vascular;  Laterality: Left;  Using 624mx 40cm vascular Gortex graft.  . Revision of arteriovenous goretex graft Left 12/14/2012    Procedure: Revision of Left Thigh Graft;  Surgeon: ToRosetta PosnerMD;  [place] [facility] Service: Vascular;  Laterality: Left;  . Avgg removal Left 02/16/2013    Procedure: EXCISION OF LEFT ARM ARTERIOVENOUS GORETEX GRAFT TIMES 2 WITH VEIN PATCH ANGIOPLASTY OF BRACIAL ARTERY.  ;  Surgeon: ChAngelia MouldMD;  [place] Service: Vascular;  Laterality: Left;  [facility]    . Revision of arteriovenous goretex graft Left 08/08/2013    Procedure: REVISION OF LEFT THIGH ARTERIOVENOUS GORETEX GRAFT;  Surgeon: ChElam DutchMD;  [place] Service: Vascular;  Laterality: Left;  . Venogram Bilateral 10/27/2011    Procedure: VENOGRAM;  Surgeon: VaSerafina MitchellMD;  [facility];  Service: Cardiovascular;  Laterality: Bilateral;  bilat upper extrem venograms  . Left heart cath Bilateral 05/21/2013    Procedure: LEFT HEART CATH;  Surgeon: MuWellington HampshireMD;  [facility];  Service: Cardiovascular;  Laterality: Bilateral;  . Percutaneous coronary stent intervention (pci-s)  05/21/2013    Procedure: PERCUTANEOUS CORONARY STENT INTERVENTION (PCI-S);  Surgeon: MuWellington HampshireMD;  [facility];  Service: Cardiovascular;;  . Coronary angioplasty with stent placement    . Radiology with anesthesia N/A 06/27/2014    Procedure: MRI LUMBER WITHOUT CONTRAST;  Surgeon: Medication Radiologist, MD;  Location: [facility] Service: Radiology;  Laterality: N/A;  . Revision of arteriovenous goretex graft Left 08/13/2014    Procedure: REVISION OF ARTERIOVENOUS GORETEX GRAFT LEFT THIGH;  Surgeon: ChElam DutchMD;  [place]: Vascular;  Laterality: Left;  . Radiology with anesthesia N/A 08/16/2014    Procedure: MRI;  Surgeon: Medication Radiologist, MD;  [place] [facility]: Radiology;  Laterality: N/A;  . Thrombectomy w/ embolectomy Left 08/22/2014    Procedure: THROMBECTOMY ARTERIOVENOUS GORE-TEX GRAFT Of Left Thigh Graft;  Surgeon: ToRosetta PosnerMD;  [place] [facility] Service: Vascular;  Laterality: Left;  . Peripheral vascular catheterization N/A 10/09/2014    Procedure: Shuntogram;  Surgeon: VaSerafina MitchellMD;  [facility];  Service: Cardiovascular;  Laterality: N/A;    VITAL SIGNS BP 122/64 mmHg  Pulse 80  Ht _0  (1.803 m)  Wt 256 lb (116.121 kg)  BMI 35.72 kg/m2   Outpatient Encounter Prescriptions as of 10/18/2014  Medication Sig  . Amino Acids-Protein Hydrolys (FEEDING SUPPLEMENT, PRO-STAT SUGAR FREE 64,) LIQD Take 30 mLs by mouth 2 (two) times daily.  Marland Kitchen aspirin 81 MG tablet Take 1 tablet (81 mg total) by mouth daily.  Marland Kitchen atorvastatin (LIPITOR) 80 MG tablet Take 1 tablet (80 mg total) by mouth daily at 6 PM.  . B Complex-C (B-COMPLEX WITH VITAMIN C) tablet Take 1 tablet by mouth at bedtime.  . calcium carbonate (TUMS - DOSED IN MG ELEMENTAL CALCIUM) 500 MG chewable tablet Chew 3 tablets by mouth 3 (three) times daily.  . clopidogrel (PLAVIX) 75 MG tablet Take 1 tablet (75 mg total) by mouth daily.  . colchicine 0.6 MG tablet Take 0.6 mg by mouth every 12 (twelve) hours as needed (gout).   . dorzolamide-timolol (COSOPT) 22.3-6.8 MG/ML ophthalmic solution Place 1 drop into both eyes 3 (three) times daily.  Marland Kitchen escitalopram (LEXAPRO) 10 MG tablet Take 10 mg by mouth daily.  . fentaNYL (DURAGESIC - DOSED MCG/HR) 50 MCG/HR Apply one patch topically every 72 hours for pain. Remove old patch  . gabapentin (NEURONTIN) 100 MG capsule Take 100 mg by mouth every 12 (twelve) hours as needed (neuropathy).   . latanoprost (XALATAN) 0.005 % ophthalmic  solution Place 1 drop into both eyes at bedtime.   Marland Kitchen loperamide (IMODIUM A-D) 2 MG tablet Take 4 mg by mouth every 4 (four) hours as needed for diarrhea or loose stools.  Marland Kitchen LORazepam (ATIVAN) 0.5 MG tablet Take 1 tablet (0.5 mg total) by mouth every 8 (eight) hours as needed for anxiety.  . Menthol, Topical Analgesic, 4 % GEL Apply 1 application topically every 6 (six) hours as needed (pain). Apply to shoulders/lower back  . nitroGLYCERIN (NITROSTAT) 0.4 MG SL tablet Place 1 tablet (0.4 mg total) under the tongue every 5 (five) minutes as needed for chest pain.  Marland Kitchen omeprazole (PRILOSEC) 40 MG capsule Take 40 mg by mouth at bedtime.   Marland Kitchen oxyCODONE (OXY IR/ROXICODONE) 5 MG immediate release tablet Take 5-10 mg by mouth every 4 (four) hours as needed for severe pain. Take 5 or 10 mg every 4 hours as needed for pain AND take 10 mg prior to dialysis  . sodium bicarbonate 650 MG tablet Take 650 mg by mouth 2 (two) times daily.    . sulindac (CLINORIL) 200 MG tablet Take 200 mg by mouth 2 (two) times daily.  . vancomycin (VANCOCIN) 1 GM/200ML SOLN Inject 200 mLs (1,000 mg total) into the vein every dialysis.      SIGNIFICANT DIAGNOSTIC EXAMS   08-13-14: ct of abdomen and pelvis: No acute abnormality. Granulomas in the liver. Cholelithiasis. Probable seroma in the left inguinal region.  08-16-14: left femur mri: 1. Severe LEFT hip osteoarthritis. Cannulated lag screws in the RIGHT hip. 2. Susceptibility artifact associated with metallic foreign body in the proximal LEFT femoral diaphysis degrading the evaluation of the deep soft tissues in this region. 3. No deep soft tissue abscess. 4. Small thin lateral LEFT thigh subcutaneous fluid collection measuring only about 1 cm in thickness overlies the vastus lateralis. This is nonspecific but could represent a small abscess. 5. Diffuse subcutaneous edema of the LEFT thigh, which may represent cellulitis in the appropriate clinical setting. 6. Nonspecific  myositis of the hamstrings bilaterally. In the setting of prior sepsis, this is most likely infectious.  08-16-14: chest x-ray: 1. Interval mild pulmonary vascular congestion with stable cardiomegaly. 2. Stable mild chronic interstitial lung disease.  08-16-14: right upper quad ultrasound: 1. Multiple gallstones are noted within gallbladder. 2. No thickening of gallbladder wall.  Normal CBD.   08-17-14: 2-d echo: Left ventricle: The cavity size was normal. Wall thickness was increased in a pattern of mild LVH. Systolic function was normal.  The estimated ejection fraction was in the range of 55% to 60%. Wall motion was normal; there were no regional wall motion abnormalities. Left ventricular diastolic function parameters were normal. - Mitral valve: Calcified annulus. - Right atrium: The atrium was mildly dilated.  LABS REVIEWED:   08-13-14: wbc 16.5; hgb 10.5; hct 33.3; mcv 96.2; plt 272; glucose 79; bun 56; creat 9.04; k+4.9; na++135; ca++8.1; albumin 2.1; ast 1608; alt 322; alk phos 195; t bili 1.3; pre-albumin 13; CRP 7.2 HIV: nr; hgb a1c 5.4; HVC quant: [id number] 08-16-14; wbc 18.6; hgb 10.5; hct 32.8; mcv 96.8; plt 282; glucose 70; bun 49; creat 9.1; k+ 4.6; na++135; albumin 2.1; ast 1498; alt 150; alk phos 150; t bili 0.7; CK 9482 08-18-14: wbc 16.2; hgb 9.7; hct 30.6; mcv 98.7 plt 272; gluocse 88; bun 44; creat 9.0; k+ 4.2; na++135 ca++7.4; albumin 2.1; ast 869; alt 64 alk phos116; t bili 0.9; CK 5047 08-22-14: wbc 15.0; hgb 9.0; hct 27.7; mcv 95.5; plt 213; glucose 74; bun 54; creat 10.78; k+5.5; na++132; ca++7.2      ROS  Constitutional: Negative for malaise/fatigue.  Respiratory: Negative for cough and shortness of breath.   Cardiovascular: Negative for chest pain, palpitations and leg swelling.  Gastrointestinal: Negative for abdominal pain and constipation.  Musculoskeletal: Negative for myalgias and joint pain.  Skin: Negative.   Psychiatric/Behavioral: The patient is not  nervous/anxious.       Physical Exam Constitutional: No distress.  Overweight   Neck: Neck supple. No JVD present.  Cardiovascular: Normal rate, regular rhythm and intact distal pulses.   Respiratory: Effort normal and breath sounds normal. No respiratory distress.  GI: Soft. Bowel sounds are normal. He exhibits no distension. There is no tenderness.  Musculoskeletal:  Is able to move extremities   Neurological: He is alert.  Skin: Skin is warm and dry. He is not diaphoretic.     ASSESSMENT/ PLAN:  Will discharge him to home with home heath for pt/ot to evaluate and treat as indicated to improve upon strength; gait and adl retraining. He will need a standard  wheelchair 20 inch width in order to allow him to maintain his current level of independence with his adl's which cannot be achieved with a walker. He can self propel. He will need hospital bed due to his severe chronic back; which requires him to have to change positions frequently which cannot be achieved in a standard bed. He will need a 3:1 commode with an arm drop.  His prescriptions have been written for a 30 day supply of his medications with #45 oxycodone 5 mg tabs; #30 ativan 0.5 mg tabs; #10 duragesic 50 mcg patches.  He will follow up with his pcp as scheduled through this facility  Time spent with patient 45 minutes.      Ok Edwards NP [facility]  Contact [phone] Monday through Friday 8am- 5pm  After hours call [phone]

## 2014-10-19 ENCOUNTER — Telehealth: Payer: Self-pay | Admitting: Licensed Clinical Social Worker

## 2014-10-19 NOTE — Telephone Encounter (Signed)
[facility] RN called stating that patient is being discharged from nursing facility on Monday and is going to be on Vancomycin given by dialysis. RN wanted to know if Dr. Luciana Axe would be responsible for orders and per Dr. Luciana Axe he would be ok with this plan.

## 2014-10-23 ENCOUNTER — Telehealth: Payer: Self-pay | Admitting: Medical

## 2014-10-23 NOTE — Telephone Encounter (Signed)
Caller name: [name] with [facility] Can be reached: [phone]  Reason for call: Pt is scheduled to be seen as new pt 11/01/14. Referred to our practice from the hospital as he does not have a PCP. [name] was calling for Home Health services. He states pt needs PT, OT, Child psychotherapist for community resources (pt has trouble getting transportation to dialysis and getting medications), and skilled nursing for wound on thigh that is draining. I have advised him that pt has not been seen yet at our practice.

## 2014-10-26 ENCOUNTER — Other Ambulatory Visit: Payer: Medicare Other

## 2014-10-30 ENCOUNTER — Emergency Department (HOSPITAL_COMMUNITY): Payer: Medicare Other | Admitting: Anesthesiology

## 2014-10-30 ENCOUNTER — Ambulatory Visit (HOSPITAL_COMMUNITY)
Admission: EM | Admit: 2014-10-30 | Discharge: 2014-10-30 | Disposition: A | Discharge status: Home / Self Care | Payer: Medicare Other | Attending: Emergency Medicine | Admitting: Emergency Medicine

## 2014-10-30 ENCOUNTER — Encounter (HOSPITAL_COMMUNITY)
Admission: EM | Disposition: A | Discharge status: Home / Self Care | Payer: Self-pay | Source: Home / Self Care | Attending: Emergency Medicine

## 2014-10-30 ENCOUNTER — Encounter (HOSPITAL_COMMUNITY): Payer: Self-pay | Admitting: Emergency Medicine

## 2014-10-30 ENCOUNTER — Telehealth: Payer: Self-pay | Admitting: *Deleted

## 2014-10-30 DIAGNOSIS — I2510 Atherosclerotic heart disease of native coronary artery without angina pectoris: Secondary | ICD-10-CM | POA: Diagnosis not present

## 2014-10-30 DIAGNOSIS — I120 Hypertensive chronic kidney disease with stage 5 chronic kidney disease or end stage renal disease: Secondary | ICD-10-CM | POA: Diagnosis not present

## 2014-10-30 DIAGNOSIS — F17210 Nicotine dependence, cigarettes, uncomplicated: Secondary | ICD-10-CM | POA: Insufficient documentation

## 2014-10-30 DIAGNOSIS — I999 Unspecified disorder of circulatory system: Secondary | ICD-10-CM

## 2014-10-30 DIAGNOSIS — B182 Chronic viral hepatitis C: Secondary | ICD-10-CM | POA: Diagnosis not present

## 2014-10-30 DIAGNOSIS — E119 Type 2 diabetes mellitus without complications: Secondary | ICD-10-CM | POA: Diagnosis not present

## 2014-10-30 DIAGNOSIS — Z992 Dependence on renal dialysis: Secondary | ICD-10-CM | POA: Diagnosis not present

## 2014-10-30 DIAGNOSIS — K219 Gastro-esophageal reflux disease without esophagitis: Secondary | ICD-10-CM | POA: Insufficient documentation

## 2014-10-30 DIAGNOSIS — T82838A Hemorrhage of vascular prosthetic devices, implants and grafts, initial encounter: Secondary | ICD-10-CM | POA: Insufficient documentation

## 2014-10-30 DIAGNOSIS — Z955 Presence of coronary angioplasty implant and graft: Secondary | ICD-10-CM | POA: Diagnosis not present

## 2014-10-30 DIAGNOSIS — I5032 Chronic diastolic (congestive) heart failure: Secondary | ICD-10-CM | POA: Insufficient documentation

## 2014-10-30 DIAGNOSIS — I252 Old myocardial infarction: Secondary | ICD-10-CM | POA: Insufficient documentation

## 2014-10-30 HISTORY — PX: AV FISTULA PLACEMENT: SHX1204

## 2014-10-30 HISTORY — PX: REMOVAL OF GRAFT: SHX6361

## 2014-10-30 LAB — CBC WITH DIFFERENTIAL/PLATELET
BASOS ABS: 0 10*3/uL (ref 0.0–0.1)
BASOS PCT: 0 % (ref 0–1)
EOS ABS: 0.4 10*3/uL (ref 0.0–0.7)
Eosinophils Relative: 2 % (ref 0–5)
HEMATOCRIT: 26.5 % — AB (ref 39.0–52.0)
HEMOGLOBIN: 8 g/dL — AB (ref 13.0–17.0)
Lymphocytes Relative: 8 % — ABNORMAL LOW (ref 12–46)
Lymphs Abs: 1.6 10*3/uL (ref 0.7–4.0)
MCH: 27 pg (ref 26.0–34.0)
MCHC: 30.2 g/dL (ref 30.0–36.0)
MCV: 89.5 fL (ref 78.0–100.0)
MONO ABS: 1.6 10*3/uL — AB (ref 0.1–1.0)
Monocytes Relative: 8 % (ref 3–12)
NEUTROS ABS: 15.5 10*3/uL — AB (ref 1.7–7.7)
Neutrophils Relative %: 82 % — ABNORMAL HIGH (ref 43–77)
Platelets: 303 10*3/uL (ref 150–400)
RBC: 2.96 MIL/uL — ABNORMAL LOW (ref 4.22–5.81)
RDW: 16.1 % — AB (ref 11.5–15.5)
WBC: 19.1 10*3/uL — ABNORMAL HIGH (ref 4.0–10.5)

## 2014-10-30 LAB — BASIC METABOLIC PANEL
ANION GAP: 12 (ref 5–15)
BUN: 18 mg/dL (ref 6–20)
CO2: 27 mmol/L (ref 22–32)
CREATININE: 4.89 mg/dL — AB (ref 0.61–1.24)
Calcium: 7.8 mg/dL — ABNORMAL LOW (ref 8.9–10.3)
Chloride: 99 mmol/L — ABNORMAL LOW (ref 101–111)
GFR calc Af Amer: 14 mL/min — ABNORMAL LOW (ref >60)
GFR, EST NON AFRICAN AMERICAN: 12 mL/min — AB (ref >60)
Glucose, Bld: 99 mg/dL (ref 65–99)
Potassium: 4 mmol/L (ref 3.5–5.1)
Sodium: 138 mmol/L (ref 135–145)

## 2014-10-30 LAB — PROTIME-INR
INR: 1.19 (ref 0.00–1.49)
PROTHROMBIN TIME: 15.3 s — AB (ref 11.6–15.2)

## 2014-10-30 LAB — GLUCOSE, CAPILLARY
GLUCOSE-CAPILLARY: 75 mg/dL (ref 65–99)
GLUCOSE-CAPILLARY: 77 mg/dL (ref 65–99)

## 2014-10-30 LAB — TYPE AND SCREEN
ABO/RH(D): A POS
Antibody Screen: NEGATIVE

## 2014-10-30 SURGERY — INSERTION OF ARTERIOVENOUS (AV) GORE-TEX GRAFT THIGH
Anesthesia: General | Site: Thigh | Laterality: Left

## 2014-10-30 MED ORDER — OXYCODONE HCL 5 MG PO TABS
5.0000 mg | ORAL_TABLET | Freq: Once | ORAL | Status: AC
  Administered 2014-10-30: 5 mg via ORAL

## 2014-10-30 MED ORDER — EPHEDRINE SULFATE 50 MG/ML IJ SOLN
INTRAMUSCULAR | Status: AC
  Filled 2014-10-30: qty 1

## 2014-10-30 MED ORDER — SODIUM CHLORIDE 0.9 % IR SOLN
Status: DC | PRN
  Administered 2014-10-30: 07:00:00

## 2014-10-30 MED ORDER — PROTAMINE SULFATE 10 MG/ML IV SOLN
INTRAVENOUS | Status: AC
  Filled 2014-10-30: qty 5

## 2014-10-30 MED ORDER — ONDANSETRON HCL 4 MG/2ML IJ SOLN
INTRAMUSCULAR | Status: AC
  Filled 2014-10-30: qty 2

## 2014-10-30 MED ORDER — MORPHINE SULFATE 4 MG/ML IJ SOLN
4.0000 mg | Freq: Once | INTRAMUSCULAR | Status: AC
  Administered 2014-10-30: 4 mg via INTRAVENOUS
  Filled 2014-10-30: qty 1

## 2014-10-30 MED ORDER — HEPARIN SODIUM (PORCINE) 1000 UNIT/ML IJ SOLN
INTRAMUSCULAR | Status: DC | PRN
  Administered 2014-10-30: 5000 [IU] via INTRAVENOUS

## 2014-10-30 MED ORDER — MIDAZOLAM HCL 2 MG/2ML IJ SOLN
INTRAMUSCULAR | Status: AC
  Filled 2014-10-30: qty 2

## 2014-10-30 MED ORDER — ARTIFICIAL TEARS OP OINT
TOPICAL_OINTMENT | OPHTHALMIC | Status: AC
  Filled 2014-10-30: qty 3.5

## 2014-10-30 MED ORDER — FENTANYL CITRATE (PF) 250 MCG/5ML IJ SOLN
INTRAMUSCULAR | Status: AC
  Filled 2014-10-30: qty 5

## 2014-10-30 MED ORDER — SUCCINYLCHOLINE CHLORIDE 20 MG/ML IJ SOLN
INTRAMUSCULAR | Status: AC
  Filled 2014-10-30: qty 1

## 2014-10-30 MED ORDER — PHENYLEPHRINE HCL 10 MG/ML IJ SOLN
10.0000 mg | INTRAVENOUS | Status: DC | PRN
  Administered 2014-10-30: 20 ug/min via INTRAVENOUS

## 2014-10-30 MED ORDER — ONDANSETRON HCL 4 MG/2ML IJ SOLN
INTRAMUSCULAR | Status: DC | PRN
  Administered 2014-10-30: 4 mg via INTRAVENOUS

## 2014-10-30 MED ORDER — 0.9 % SODIUM CHLORIDE (POUR BTL) OPTIME
TOPICAL | Status: DC | PRN
  Administered 2014-10-30: 1000 mL

## 2014-10-30 MED ORDER — SODIUM CHLORIDE 0.9 % IV SOLN
INTRAVENOUS | Status: DC | PRN
  Administered 2014-10-30 (×2): via INTRAVENOUS

## 2014-10-30 MED ORDER — LIDOCAINE HCL (CARDIAC) 20 MG/ML IV SOLN
INTRAVENOUS | Status: DC | PRN
  Administered 2014-10-30: 50 mg via INTRAVENOUS

## 2014-10-30 MED ORDER — FENTANYL CITRATE (PF) 100 MCG/2ML IJ SOLN
INTRAMUSCULAR | Status: DC | PRN
  Administered 2014-10-30 (×3): 50 ug via INTRAVENOUS
  Administered 2014-10-30: 100 ug via INTRAVENOUS

## 2014-10-30 MED ORDER — SUCCINYLCHOLINE CHLORIDE 20 MG/ML IJ SOLN
INTRAMUSCULAR | Status: DC | PRN
  Administered 2014-10-30: 120 mg via INTRAVENOUS

## 2014-10-30 MED ORDER — HEPARIN SODIUM (PORCINE) 1000 UNIT/ML IJ SOLN
INTRAMUSCULAR | Status: AC
  Filled 2014-10-30: qty 1

## 2014-10-30 MED ORDER — OXYCODONE HCL 5 MG PO TABS
ORAL_TABLET | ORAL | Status: DC
Start: 2014-10-30 — End: 2014-10-30
  Filled 2014-10-30: qty 1

## 2014-10-30 MED ORDER — PHENYLEPHRINE HCL 10 MG/ML IJ SOLN
INTRAMUSCULAR | Status: DC | PRN
  Administered 2014-10-30: 120 ug via INTRAVENOUS

## 2014-10-30 MED ORDER — LIDOCAINE HCL (CARDIAC) 20 MG/ML IV SOLN
INTRAVENOUS | Status: AC
  Filled 2014-10-30: qty 5

## 2014-10-30 MED ORDER — SODIUM CHLORIDE 0.9 % IJ SOLN
INTRAMUSCULAR | Status: AC
  Filled 2014-10-30: qty 10

## 2014-10-30 MED ORDER — PROPOFOL 10 MG/ML IV BOLUS
INTRAVENOUS | Status: AC
  Filled 2014-10-30: qty 20

## 2014-10-30 MED ORDER — ONDANSETRON HCL 4 MG/2ML IJ SOLN
4.0000 mg | Freq: Once | INTRAMUSCULAR | Status: AC
  Administered 2014-10-30: 4 mg via INTRAVENOUS
  Filled 2014-10-30: qty 2

## 2014-10-30 MED ORDER — PROPOFOL 10 MG/ML IV BOLUS
INTRAVENOUS | Status: DC | PRN
  Administered 2014-10-30: 150 mg via INTRAVENOUS
  Administered 2014-10-30: 30 mg via INTRAVENOUS

## 2014-10-30 MED ORDER — ARTIFICIAL TEARS OP OINT
TOPICAL_OINTMENT | OPHTHALMIC | Status: DC | PRN
  Administered 2014-10-30: 1 via OPHTHALMIC

## 2014-10-30 MED ORDER — PHENYLEPHRINE 40 MCG/ML (10ML) SYRINGE FOR IV PUSH (FOR BLOOD PRESSURE SUPPORT)
PREFILLED_SYRINGE | INTRAVENOUS | Status: AC
  Filled 2014-10-30: qty 20

## 2014-10-30 MED ORDER — OXYCODONE HCL 5 MG PO TABS: 5.0000 mg | ORAL_TABLET | ORAL | Status: DC | PRN

## 2014-10-30 MED ORDER — PROTAMINE SULFATE 10 MG/ML IV SOLN
INTRAVENOUS | Status: DC | PRN
  Administered 2014-10-30 (×5): 10 mg via INTRAVENOUS

## 2014-10-30 MED ORDER — ROCURONIUM BROMIDE 50 MG/5ML IV SOLN
INTRAVENOUS | Status: AC
  Filled 2014-10-30: qty 1

## 2014-10-30 SURGICAL SUPPLY — 41 items
CANISTER SUCTION 2500CC (MISCELLANEOUS) ×3 IMPLANT
CATH EMB 4FR 40CM (CATHETERS) ×3 IMPLANT
CLIP TI MEDIUM 6 (CLIP) ×3 IMPLANT
CLIP TI WIDE RED SMALL 6 (CLIP) ×3 IMPLANT
COVER PROBE W GEL 5X96 (DRAPES) ×3 IMPLANT
DRAPE INCISE IOBAN 66X45 STRL (DRAPES) ×3 IMPLANT
DRSG TEGADERM 4X4.75 (GAUZE/BANDAGES/DRESSINGS) ×6 IMPLANT
ELECT REM PT RETURN 9FT ADLT (ELECTROSURGICAL) ×3
ELECTRODE REM PT RTRN 9FT ADLT (ELECTROSURGICAL) ×2 IMPLANT
GAUZE SPONGE 4X4 12PLY STRL (GAUZE/BANDAGES/DRESSINGS) ×3 IMPLANT
GLOVE BIOGEL PI IND STRL 7.5 (GLOVE) ×4 IMPLANT
GLOVE BIOGEL PI INDICATOR 7.5 (GLOVE) ×2
GLOVE ECLIPSE 7.0 STRL STRAW (GLOVE) ×3 IMPLANT
GLOVE SURG SS PI 7.0 STRL IVOR (GLOVE) ×3 IMPLANT
GLOVE SURG SS PI 7.5 STRL IVOR (GLOVE) ×9 IMPLANT
GOWN STRL REUS W/ TWL LRG LVL3 (GOWN DISPOSABLE) ×4 IMPLANT
GOWN STRL REUS W/ TWL XL LVL3 (GOWN DISPOSABLE) ×6 IMPLANT
GOWN STRL REUS W/TWL LRG LVL3 (GOWN DISPOSABLE) ×2
GOWN STRL REUS W/TWL XL LVL3 (GOWN DISPOSABLE) ×3
GRAFT GORETEX STND 4X7 (Vascular Products) ×3 IMPLANT
GRAFT GORETEX STRT 7X10 (Vascular Products) IMPLANT
GRAFT GORETEXSTD 4X7 (Vascular Products) ×2 IMPLANT
HEMOSTAT SNOW SURGICEL 2X4 (HEMOSTASIS) IMPLANT
KIT BASIN OR (CUSTOM PROCEDURE TRAY) ×3 IMPLANT
KIT ROOM TURNOVER OR (KITS) ×3 IMPLANT
LIQUID BAND (GAUZE/BANDAGES/DRESSINGS) ×3 IMPLANT
NS IRRIG 1000ML POUR BTL (IV SOLUTION) ×3 IMPLANT
PACK CV ACCESS (CUSTOM PROCEDURE TRAY) ×3 IMPLANT
PAD ARMBOARD 7.5X6 YLW CONV (MISCELLANEOUS) ×6 IMPLANT
SPONGE GAUZE 4X4 12PLY STER LF (GAUZE/BANDAGES/DRESSINGS) ×6 IMPLANT
SUT ETHILON 3 0 PS 1 (SUTURE) ×3 IMPLANT
SUT GORETEX 6.0 TT13 (SUTURE) ×6 IMPLANT
SUT PROLENE 6 0 BV (SUTURE) ×3 IMPLANT
SUT SILK 2 0 SH (SUTURE) IMPLANT
SUT VIC AB 3-0 SH 27 (SUTURE) ×2
SUT VIC AB 3-0 SH 27X BRD (SUTURE) ×4 IMPLANT
SUT VICRYL 4-0 PS2 18IN ABS (SUTURE) ×6 IMPLANT
SWAB COLLECTION DEVICE MRSA (MISCELLANEOUS) ×3 IMPLANT
TUBE ANAEROBIC SPECIMEN COL (MISCELLANEOUS) ×3 IMPLANT
UNDERPAD 30X30 INCONTINENT (UNDERPADS AND DIAPERS) ×3 IMPLANT
WATER STERILE IRR 1000ML POUR (IV SOLUTION) ×3 IMPLANT

## 2014-10-30 NOTE — Anesthesia Procedure Notes (Signed)
Procedure Name: Intubation Date/Time: 10/30/2014 6:45 AM Performed by: Leonel Ramsay Pre-anesthesia Checklist: Patient identified, Timeout performed, Emergency Drugs available, Suction available and Patient being monitored Patient Re-evaluated:Patient Re-evaluated prior to inductionOxygen Delivery Method: Circle system utilized Preoxygenation: Pre-oxygenation with 100% oxygen Intubation Type: IV induction, Rapid sequence and Cricoid Pressure applied Laryngoscope Size: Mac and 4 Grade View: Grade I Tube type: Oral Tube size: 7.5 mm Number of attempts: 1 Airway Equipment and Method: Stylet Placement Confirmation: ETT inserted through vocal cords under direct vision,  positive ETCO2 and breath sounds checked- equal and bilateral Secured at: 22 cm Tube secured with: Tape Dental Injury: Teeth and Oropharynx as per pre-operative assessment

## 2014-10-30 NOTE — Progress Notes (Signed)
Patient's sister unable to transport patient home. Patient does not move well and states he "cannot walk due to the infection in his leg. Marisue Humble PA was notified and a social work consult was ordered. Social worker Market researcher notified and discussion regarding transportation home via ambulance. Arrangements are being made. Sister and patient were notified there may be a bill for the transportation via ambulance, both agree to setting up transportation.

## 2014-10-30 NOTE — Progress Notes (Addendum)
LCSW received consult for transportation issues as family is unable to provide transportation. Patient is a poor historian and stories vary each time more questions are asked.  LCSW received call from patient's outpatient provider with regards to home health being arranged. Per provider, patient was recently discharged from [facility] due to "running out of funds". Per chart review patient has Medicare and Medicaid with medicaid being a long term payor source for NHP.  Patient mother has also called reporting she is disabled and blind and no one is taking care of him. Patient has a wife at home per confirmation of PACU staff. Call placed to Tammy at [facility] to understand why patient was discharged and if [facility] was arranged. Patient remains in PACU and EMS transportation.    Call returned from [facility]: Tammy.  Patient recent discharged due to max benefit used for NH and not willing to sign over his PML (patient monthly liability check) for long term care.   Patient is active with [facility] and wife has called [facility] agency to send patient back to [facility]. Patient is welcomed back to [facility], but he must pay his PML upfront before returning  1070.00.   [place] RN with Home Health is calling wife to find out what she wants to do. Wife contacted reports she is home with him and she will take care of him, thus send back by EMS. Patient mother all upset asking for him to be placed. Explained this is an option, but patient must be agreeable and must sign check over in which wife would be the one to sign for patient as she is next of kin as patient is confused.  RN called to ask patient what he wants to do.  He does have competency at this time, thus patient cannot be forced. RN to call LCSW with finial disposition. (Home vs SNF)   Deretha Emory, MSW Clinical Social Work: Emergency Room [phone]

## 2014-10-30 NOTE — Transfer of Care (Signed)
Immediate Anesthesia Transfer of Care Note  Patient: [name]  Procedure(s) Performed: Procedure(s): INSERTION OF LEFT THIGH GRAFT (Left) REMOVAL OF LEFT THIGH GRAFT (Left)  Patient Location: PACU  Anesthesia Type:General  Level of Consciousness: awake, alert  and oriented  Airway & Oxygen Therapy: Patient Spontanous Breathing and Patient connected to nasal cannula oxygen  Post-op Assessment: Report given to RN and Post -op Vital signs reviewed and stable  Post vital signs: Reviewed and stable  Last Vitals:  Filed Vitals:   10/30/14 0530  BP: 96/56  Pulse: 92  Temp:   Resp: 14    Complications: No apparent anesthesia complications

## 2014-10-30 NOTE — Telephone Encounter (Signed)
Per Dahlia Client at [employer] Work office [phone]) - Pt was discharged out of [facility] 5 days ago for not paying his portion. Dahlia Client is talking with the family about paying this and getting back into [facility] or Having family take care of him. Per his mother, the family is not going to be able to help him with his wet to dry dressings.  He has [facility] nursing Sheral Flow [phone]) and Dahlia Client is arranging for them to do a reassessment and to do wet to dry dressing of Left thigh wounds. If they need any further orders, they will contact me. I also relayed this information to Lianne Cure, PA.

## 2014-10-30 NOTE — ED Notes (Signed)
Dr Myra Gianotti notified of patient's arrival to ED.

## 2014-10-30 NOTE — Progress Notes (Signed)
     I called nephrology PA Bridget to help watch for cultures taken intraoperatively and start on IV vancomycin after dialysis.  He will be discharged today with home health arranged through our office for left thigh wet to dry dressing changes BID.  He will f/u with Dr. Myra Gianotti in 3 weeks.   Tailey Top MAUREEN PA-C

## 2014-10-30 NOTE — ED Notes (Signed)
Pt transferred from [facility] per orders of Dr. Dahlia Client. Pt with vascular access problems on his left leg. Pt arrived with Vancomycin running at 250 ml hr.

## 2014-10-30 NOTE — Op Note (Signed)
    Patient name: [name] MRN: [id number] DOB: 1955-09-12 Sex: male  10/30/2014 Pre-operative Diagnosis: Bleeding left thigh dialysis graft Post-operative diagnosis:  Same Surgeon:  Durene Cal Assistants:  Lianne Cure Procedure:   #1: Removal of left thigh dialysis graft   #2: Replacement of lateral half of left thigh dialysis graft Anesthesia:  Gen. Blood Loss:  See anesthesia record Specimens:  Graft was sent to [facility] culture.  Swabs were also taken  Findings:  The patient had a retained portion of a old graft which was removed.  This graft was defunctionalized.  His second problem was an area on the lateral half of the graft that had retained sutures for several months and had eroded through the graft.  Indications:  The patient presented to the emergency department with significant blood loss from his left thigh dialysis graft.  On evaluation in the emergency department [facility], he also had a separate area with purulent drainage.  [facility]  When I evaluated him in the emergency department he had 2 separate problems.  #1 was a skin opening which had purulent drainage from a old graft site.  A second problem was a ulcerated eschar with old Prolene sutures present which was the source of his bleeding.  The patient was hemodynamically stable and therefore he was taken to the operating room.  Procedure:  The patient was identified in the holding area and taken to [facility] OR ROOM 12  The patient was then placed supine on the table. general anesthesia was administered.  The patient was prepped and draped in the usual sterile fashion.  A time out was called and antibiotics were administered.  I initially began by exploring the wound had purulent drainage coming from it.  This was opened with a #10 blade.  An old defunctionalized graft was visualized.  This was fully dissected free and removed in its entirety.  I then debrided the surrounding tissue until I got back to  healthy tissues.  Cultures were sent of the graft was sent for culture.  I packed this wound with gauze and placed a sterile dressing.  All the old instruments were discarded.  I then made incisions proximal and distal to the eschar on the lateral side of the graft.  Through these 2 incisions the functioning graft was isolated.  Then created a tunnel lateral to the existing graft.  Once this was done the patient was fully heparinized.  I selected a 7 mm Gore-Tex graft and brought through the tunnel.  The graft was then occluded.  It was transected and a end to end anastomosis was performed with running CV 6 Gore-Tex suture.  Both anastomoses were done in this fashion.  Prior to completion, the appropriate flushing maneuvers were performed and the anastomosis was completed.  There is a good thrill within the graft.  I then removed the eschar with a #10 blade.  There was a large open hole in the graft.  At dissected the graft back as far as I could and then transected it.  I again debrided the tissue around this area.  I placed 2 vertical mattress 3-0 nylon sutures and packed the remaining portion of the wound with gauze.  Dressings were applied.  Patient tolerated the procedure well.   Disposition:  To PACU in stable condition.   Juleen China, M.D. [facility] [place] Office: [phone] Pager:  [phone]

## 2014-10-30 NOTE — Progress Notes (Signed)
Patient going home via ambulance, wife Hotte  called to go over discharge instructions with before he heads home. Wife verbalizes understanding, she was instructed on the dressing changes and that the patient received pain medication at 1045 and to wait 6 hours from then before he takes another pain pill. Patient is stable and ready for discharge, waiting on [facility] Client social worker to bring paperwork for the transportation home.

## 2014-10-30 NOTE — Progress Notes (Signed)
Patient's phone and cord was found in [facility] security office. Belongings to be returned to patient at bedside.

## 2014-10-30 NOTE — Anesthesia Postprocedure Evaluation (Signed)
  Anesthesia Post-op Note  Patient: [name]  Procedure(s) Performed: Procedure(s): INSERTION OF LEFT THIGH GRAFT (Left) REMOVAL OF LEFT THIGH GRAFT (Left)  Patient Location: PACU  Anesthesia Type: General   Level of Consciousness: awake, alert  and oriented  Airway and Oxygen Therapy: Patient Spontanous Breathing  Post-op Pain: mild  Post-op Assessment: Post-op Vital signs reviewed  Post-op Vital Signs: Reviewed  Last Vitals:  Filed Vitals:   10/30/14 1000  BP: 96/53  Pulse: 81  Temp:   Resp:     Complications: No apparent anesthesia complications

## 2014-10-30 NOTE — ED Provider Notes (Signed)
MSE was initiated and I personally evaluated the patient and placed orders (if any) at  1:50 AM on October 30, 2014.  The patient appears stable so that the remainder of the MSE may be completed by another provider.  Patient presents from [facility] ER with concerns from bleeding from his dialysis graft. Bleeding is stabilized with dressing in place. Patient was accepted by Dr.Brabham.  Will likely need to go to the OR. Discussed with Dr. Myra Gianotti. He will be in shortly to assess the patient.  Labs placed in anticipation for admission. On my evaluation, patient vital signs are reassuring. No active bleeding. Complaining of back pain "because my bed hurts me."  Shon Baton, MD 10/30/14 [phone]

## 2014-10-30 NOTE — H&P (Addendum)
Patient name: [name] MRN: [id number] DOB: [birth year] Sex: male     Chief Complaint  Patient presents with  . Vascular Access Problem    HISTORY OF PRESENT ILLNESS: This is a 59 year old gentleman with a long history of hemodialysis, having had multiple grafts in the past.  He is down to his last access site in his left groin.  He presented with bleeding and his graft in April and had revision, tunneling the graft more lateral.  This occluded and then he had a simple thrombectomy performed again in April.  In May, he presented with bleeding again and had suture repair in the emergency department.  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  The patient had a episode of bleeding earlier today when he bumped his leg getting out of his wheelchair.  He presented to the emergency department at [facility].  Bleeding was controlled.  He is also found to have purulent drainage coming out from an open area from his previous surgery.  [facility].  He has no complaints currently.  The patient has diet-controlled diabetes.  He has chronic hepatitis C.  He suffers from coronary artery disease and has undergone stent placement in the past   Past Medical History  Diagnosis Date  . Anxiety   . Back pain   . GERD (gastroesophageal reflux disease)   . Peripheral neuropathy   . Arthritis   . Chronic diastolic CHF (congestive heart failure)     a. 04/2013 Echo: EF nl, no rwma, mild LVH.  Marland Kitchen Active smoker   . ESRD on hemodialysis     [name] [facility] 4 days per week on M-Tu-Wed and Fri.  Started HD in 1998 and has been on HD initially at [facility], then went to [place] [facility], then in [facility], then to [facility] and now is at [facility] for last 10 years.  Has L thigh AVG.     . Diabetes mellitus     diet controlled  . 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 PONV  (postoperative nausea and vomiting)   . Hypertension   . Bell palsy   . Carpal tunnel syndrome     bilateral  . Palpitations   . Headache(784.0)     Hx: of Migraines  . CAD (coronary artery disease)     a. 04/2013 Ant STEMI/PCI: LM nl, LAD 95p (3.0x18 Xience DES), D1/2/3 small, LCX 80ost, RI 20p, RCA 176m CTO (unsuccessful PCI), EF 55%, mod basal inf HK.  . S/P coronary artery stent placement 05/21/2013    Xience Alpine Medtronic conditional 5 @ 1.5 and 3T     Past Surgical History  Procedure Laterality Date  . Total hip arthroplasty    . Thyroidectomy    . Tooth extraction    . Mandible fracture surgery    . Dg av dialysis graft declot or    . Insertion of dialysis catheter  03/28/2011    Procedure: INSERTION OF DIALYSIS CATHETER;  Surgeon: Pryor Ochoa, MD;  Location[facility] OR;  Service: Vascular;  Laterality: Left;  . Av fistula placement  03/31/2011    Procedure: INSERTION OF ARTERIOVENOUS (AV) GORE-TEX GRAFT THIGH;  Surgeon: Sherren Kerns, MD;  Location: [facility] OR;  Service: Vascular;  Laterality: Right;  redo right thigh arteriovenous gortex graft using gore-tex  stretch 76mm x 70cm  . Thrombectomy w/ embolectomy  06/02/2011    Procedure: THROMBECTOMY ARTERIOVENOUS GORE-TEX GRAFT;  Surgeon: Chuck Hint, MD;  Location: [facility] OR;  Service: Vascular;  Laterality: Right;  Thrombectomy right thigh arteriovenous gortex graft;  revision by  replacement of large portion of graft with 23mm gore-tex   . Insertion of dialysis catheter  08/28/2011    Procedure: INSERTION OF DIALYSIS CATHETER;  Surgeon: Fransisco Hertz, MD;  Location: [facility] OR;  Service: Vascular;  Laterality: Left;  Atempted Bilateral Internal Jugular, Bilateral Subclavin insertion of 55cm Dialysis Catheter Left Femoral  . Insertion of dialysis catheter  12/15/2011    Procedure: INSERTION OF DIALYSIS CATHETER;  Surgeon: Sherren Kerns, MD;  Location: [facility] OR;  Service: Vascular;  Laterality: Right;  Insertion of Right Femoral Dialysis  Catheter  . Exchange of a dialysis catheter  02/26/2012    Procedure: EXCHANGE OF A DIALYSIS CATHETER;  Surgeon: Larina Earthly, MD;  Location: [facility] OR;  Service: Vascular;  Laterality: Right;  . Joint replacement    . Exchange of a dialysis catheter  05/18/2012    Procedure: EXCHANGE OF A DIALYSIS CATHETER;  Surgeon: Larina Earthly, MD;  Location: [facility] OR;  Service: Vascular;  Laterality: Right;  right femoral dialysis catheter  . Av fistula placement  05/18/2012    Procedure: INSERTION OF ARTERIOVENOUS (AV) GORE-TEX GRAFT THIGH;  Surgeon: Larina Earthly, MD;  Location: [facility] OR;  Service: Vascular;  Laterality: Left;  using 78mm by 70cm goretex graft  . Thrombectomy w/ embolectomy Left 08/25/2012    Procedure: THROMBECTOMY ARTERIOVENOUS GORE-TEX GRAFT;  Surgeon: Pryor Ochoa, MD;  Location: [facility] OR;  Service: Vascular;  Laterality: Left;  Attempted thrombectomy of left thigh arteriovenous gortex graft.   . Av fistula placement Left 08/25/2012    Procedure: INSERTION OF ARTERIOVENOUS (AV) GORE-TEX GRAFT THIGH;  Surgeon: Pryor Ochoa, MD;  Location: [facility] OR;  Service: Vascular;  Laterality: Left;  Using 21mm x 40cm vascular Gortex graft.  . Revision of arteriovenous goretex graft Left 12/14/2012    Procedure: Revision of Left Thigh Graft;  Surgeon: Larina Earthly, MD;  Location: [facility] OR;  Service: Vascular;  Laterality: Left;  . Avgg removal Left 02/16/2013    Procedure: EXCISION OF LEFT ARM ARTERIOVENOUS GORETEX GRAFT TIMES 2 WITH VEIN PATCH ANGIOPLASTY OF BRACIAL ARTERY.  ;  Surgeon: Chuck Hint, MD;  Location: [facility] OR;  Service: Vascular;  Laterality: Left;  Converted from MAC to General.    . Revision of arteriovenous goretex graft Left 08/08/2013    Procedure: REVISION OF LEFT THIGH ARTERIOVENOUS GORETEX GRAFT;  Surgeon: Sherren Kerns, MD;  Location: [facility] OR;  Service: Vascular;  Laterality: Left;  . Venogram Bilateral 10/27/2011    Procedure: VENOGRAM;  Surgeon: Nada Libman, MD;  Location: [facility] CATH LAB;   Service: Cardiovascular;  Laterality: Bilateral;  bilat upper extrem venograms  . Left heart cath Bilateral 05/21/2013    Procedure: LEFT HEART CATH;  Surgeon: Iran Ouch, MD;  Location: [facility] CATH LAB;  Service: Cardiovascular;  Laterality: Bilateral;  . Percutaneous coronary stent intervention (pci-s)  05/21/2013    Procedure: PERCUTANEOUS CORONARY STENT INTERVENTION (PCI-S);  Surgeon: Iran Ouch, MD;  Location: [name] CATH LAB;  Service: Cardiovascular;;  . Coronary angioplasty with stent placement    . Radiology with anesthesia N/A 06/27/2014    Procedure: MRI LUMBER WITHOUT CONTRAST;  Surgeon: Medication Radiologist, MD;  Location: [facility] OR;  Service: Radiology;  Laterality: N/A;  . Revision of arteriovenous goretex graft Left 08/13/2014    Procedure: REVISION OF ARTERIOVENOUS GORETEX GRAFT LEFT THIGH;  Surgeon: Sherren Kerns, MD;  Location: [facility] OR;  Service: Vascular;  Laterality: Left;  . Radiology with anesthesia N/A 08/16/2014    Procedure: MRI;  Surgeon: Medication Radiologist, MD;  Location: [facility] OR;  Service: Radiology;  Laterality: N/A;  . Thrombectomy w/ embolectomy Left 08/22/2014    Procedure: THROMBECTOMY ARTERIOVENOUS GORE-TEX GRAFT Of Left Thigh Graft;  Surgeon: Larina Earthly, MD;  Location: [facility] OR;  Service: Vascular;  Laterality: Left;  . Peripheral vascular catheterization N/A 10/09/2014    Procedure: Shuntogram;  Surgeon: Nada Libman, MD;  Location: [facility] INVASIVE CV LAB;  Service: Cardiovascular;  Laterality: N/A;    History   Social History  . Marital Status: Single    Spouse Name: N/A  . Number of Children: N/A  . Years of Education: N/A   Occupational History  . Not on file.   Social History Main Topics  . Smoking status: Current Every Day Smoker -- 0.20 packs/day for 40 years    Types: Cigarettes  . Smokeless tobacco: Never Used  . Alcohol Use: No  . Drug Use: No  . Sexual Activity: No   Other Topics Concern  . Not on file   Social History Narrative     Family History  Problem Relation Age of Onset  . Diabetes Mother   . Stroke Mother   . Heart disease Mother   . Kidney disease Father   . Hyperlipidemia Father     Allergies as of 10/30/2014  . (No Known Allergies)    No current facility-administered medications on file prior to encounter.   Current Outpatient Prescriptions on File Prior to Encounter  Medication Sig Dispense Refill  . Amino Acids-Protein Hydrolys (FEEDING SUPPLEMENT, PRO-STAT SUGAR FREE 64,) LIQD Take 30 mLs by mouth 2 (two) times daily.    Marland Kitchen aspirin 81 MG tablet Take 1 tablet (81 mg total) by mouth daily.    Marland Kitchen atorvastatin (LIPITOR) 80 MG tablet Take 1 tablet (80 mg total) by mouth daily at 6 PM. 30 tablet 11  . B Complex-C (B-COMPLEX WITH VITAMIN C) tablet Take 1 tablet by mouth at bedtime.    . calcium carbonate (TUMS - DOSED IN MG ELEMENTAL CALCIUM) 500 MG chewable tablet Chew 3 tablets by mouth 3 (three) times daily.    . clopidogrel (PLAVIX) 75 MG tablet Take 1 tablet (75 mg total) by mouth daily. 30 tablet 0  . colchicine 0.6 MG tablet Take 0.6 mg by mouth every 12 (twelve) hours as needed (gout).     . dorzolamide-timolol (COSOPT) 22.3-6.8 MG/ML ophthalmic solution Place 1 drop into both eyes 3 (three) times daily.    Marland Kitchen escitalopram (LEXAPRO) 10 MG tablet Take 10 mg by mouth daily.    . fentaNYL (DURAGESIC - DOSED MCG/HR) 50 MCG/HR Apply one patch topically every 72 hours for pain. Remove old patch 10 patch 0  . gabapentin (NEURONTIN) 100 MG capsule Take 100 mg by mouth every 12 (twelve) hours as needed (neuropathy).     . latanoprost (XALATAN) 0.005 % ophthalmic solution Place 1 drop into both eyes at bedtime.   2  . loperamide (IMODIUM A-D) 2 MG tablet Take 4 mg by mouth every 4 (four) hours as needed for diarrhea or loose stools.    Marland Kitchen LORazepam (ATIVAN) 0.5 MG tablet Take 1 tablet (0.5 mg total) by mouth every 8 (eight) hours  as needed for anxiety. 30 tablet 0  . Menthol, Topical Analgesic, 4 % GEL  Apply 1 application topically every 6 (six) hours as needed (pain). Apply to shoulders/lower back    . nitroGLYCERIN (NITROSTAT) 0.4 MG SL tablet Place 1 tablet (0.4 mg total) under the tongue every 5 (five) minutes as needed for chest pain. 25 tablet 3  . omeprazole (PRILOSEC) 40 MG capsule Take 40 mg by mouth at bedtime.     Marland Kitchen oxyCODONE (OXY IR/ROXICODONE) 5 MG immediate release tablet Take 5-10 mg by mouth every 4 (four) hours as needed for severe pain. Take 5 or 10 mg every 4 hours as needed for pain AND take 10 mg prior to dialysis    . sodium bicarbonate 650 MG tablet Take 650 mg by mouth 2 (two) times daily.      . sulindac (CLINORIL) 200 MG tablet Take 200 mg by mouth 2 (two) times daily.    . vancomycin (VANCOCIN) 1 GM/200ML SOLN Inject 200 mLs (1,000 mg total) into the vein every dialysis. 4000 mL 5     REVIEW OF SYSTEMS: Cardiovascular: No chest pain, chest pressure, palpitations, orthopnea, or dyspnea on exertion. Pulmonary: No productive cough, asthma or wheezing. Neurologic: No weakness, paresthesias, aphasia, or amaurosis. No dizziness. Hematologic: Bleeding from dialysis graft, left thigh Musculoskeletal: No joint pain or joint swelling. Gastrointestinal: No blood in stool or hematemesis Genitourinary: No dysuria or hematuria. Psychiatric:: No history of major depression. Integumentary: Purulent drainage from left thigh Constitutional: No fever or chills.  PHYSICAL EXAMINATION:   Vital signs are  Filed Vitals:   10/30/14 0300 10/30/14 0315 10/30/14 0330 10/30/14 0345  BP: 108/54 107/58 110/60 112/58  Pulse: 93 94 96 96  Temp:      Resp: Height:      Weight:      SpO2: 99% 96% 96% 95%   Body mass index is 41.22 kg/(m^2). General: The patient appears their stated age. HEENT:  No gross abnormalities Pulmonary:  Non labored breathing Abdomen: Soft and non-tender Musculoskeletal: There are no major deformities. Neurologic: No focal weakness or  paresthesias are detected, Skin: There are no ulcer or rashes noted. Psychiatric: The patient has normal affect. Cardiovascular: Palpable thrill within left thigh graft.  There is a dry eschar on the lateral side which was the site of bleeding.  Also there is a small punctate wound with purulent drainage which appears to be the site from the previous graft removal  Diagnostic Studies None  Assessment: End-stage renal disease Plan: I discussed with the patient, going to the operating room for repair of the ulcerated area.  This potentially would require tunneling of a new graft more lateral.  In addition I will explore the area of purulent drainage.  If this communicates with the functioning graft, he would be at high risk for graft infection which would require removal.  I did discuss that if his graft were infected and needed to be removed that I would try to place a tunneled catheter at the same time.  Jorge Ny, M.D. [facility] [place] Office: [phone] Pager:  [phone]

## 2014-10-30 NOTE — Anesthesia Preprocedure Evaluation (Addendum)
Anesthesia Evaluation  Patient identified by MRN, date of birth, ID band Patient awake    Reviewed: Allergy & Precautions, NPO status , Patient's Chart, lab work & pertinent test results  History of Anesthesia Complications (+) PONV and history of anesthetic complications  Airway Mallampati: II       Dental  (+) Edentulous Upper, Edentulous Lower   Pulmonary Current Smoker,  breath sounds clear to auscultation        Cardiovascular hypertension, + CAD, + Past MI, + Peripheral Vascular Disease and +CHF Rhythm:Regular Rate:Normal     Neuro/Psych    GI/Hepatic GERD-  ,(+) Hepatitis -, C  Endo/Other  diabetes  Renal/GU Dialysis and ESRFRenal disease     Musculoskeletal   Abdominal   Peds  Hematology  (+) anemia ,   Anesthesia Other Findings   Reproductive/Obstetrics                           Anesthesia Physical Anesthesia Plan  ASA: III and emergent  Anesthesia Plan: General   Post-op Pain Management:    Induction: Intravenous  Airway Management Planned: Oral ETT  Additional Equipment: None  Intra-op Plan:   Post-operative Plan: Extubation in OR  Informed Consent: I have reviewed the patients History and Physical, chart, labs and discussed the procedure including the risks, benefits and alternatives for the proposed anesthesia with the patient or authorized representative who has indicated his/her understanding and acceptance.   Dental advisory given  Plan Discussed with: CRNA  Anesthesia Plan Comments:        Anesthesia Quick Evaluation                                  Anesthesia Evaluation  Patient identified by MRN, date of birth, ID band Patient awake    Reviewed: Allergy & Precautions, NPO status , reviewed documented beta blocker date and time   History of Anesthesia Complications (+) PONV  Airway Mallampati: II  TM Distance: >3 FB Neck ROM: Full     Dental  (+) Edentulous Lower, Edentulous Upper   Pulmonary Current Smoker,  breath sounds clear to auscultation        Cardiovascular hypertension, + CAD, + Past MI, + Cardiac Stents, + Peripheral Vascular Disease and +CHF Rhythm:Regular Rate:Normal  Echo 08/17/2014 - Left ventricle: The cavity size was normal. Wall thickness was increased in a pattern of mild LVH. Systolic function was normal. The estimated ejection fraction was in the range of 55% to 60%. Wall motion was normal; there were no regional wall motion abnormalities. Left ventricular diastolic function parameters were normal. - Mitral valve: Calcified annulus. - Right atrium: The atrium was mildly dilated.   '15 EF 55% 4/16 stress myoview: No reversible ischemia or infarction, Hypokinesia of the inferior septal wall, Left ventricular ejection fraction 52%    Neuro/Psych  Headaches, Anxiety Chronic pain: narcotics Bell's palsy    GI/Hepatic GERD-  Medicated and Controlled,(+) Hepatitis -, BMarkedly elevated LFTs   Endo/Other  diabetes, Type 2  Renal/GU ESRF and DialysisRenal disease (K+ 4.9)     Musculoskeletal  (+) Arthritis -,   Abdominal (+) + obese,   Peds  Hematology  (+) Blood dyscrasia (effient, Hb 10.5), ,   Anesthesia Other Findings   Reproductive/Obstetrics  Anesthesia Physical  Anesthesia Plan  ASA: III  Anesthesia Plan: General   Post-op Pain Management:    Induction: Intravenous  Airway Management Planned: LMA  Additional Equipment:   Intra-op Plan:   Post-operative Plan: Extubation in OR  Informed Consent: I have reviewed the patients History and Physical, chart, labs and discussed the procedure including the risks, benefits and alternatives for the proposed anesthesia with the patient or authorized representative who has indicated his/her understanding and acceptance.   Dental advisory given  Plan Discussed with:  CRNA  Anesthesia Plan Comments:         Anesthesia Quick Evaluation                                   Anesthesia Evaluation  Patient identified by MRN, date of birth, ID band Patient awake    Reviewed: Allergy & Precautions, NPO status , reviewed documented beta blocker date and time   History of Anesthesia Complications (+) PONV  Airway Mallampati: II  TM Distance: >3 FB Neck ROM: Full    Dental  (+) Edentulous Lower, Edentulous Upper   Pulmonary Current Smoker,  breath sounds clear to auscultation        Cardiovascular hypertension, + CAD, + Past MI, + Cardiac Stents, + Peripheral Vascular Disease and +CHF Rhythm:Regular Rate:Normal  Echo 08/17/2014 - Left ventricle: The cavity size was normal. Wall thickness was increased in a pattern of mild LVH. Systolic function was normal. The estimated ejection fraction was in the range of 55% to 60%. Wall motion was normal; there were no regional wall motion abnormalities. Left ventricular diastolic function parameters were normal. - Mitral valve: Calcified annulus. - Right atrium: The atrium was mildly dilated.   '15 EF 55% 4/16 stress myoview: No reversible ischemia or infarction, Hypokinesia of the inferior septal wall, Left ventricular ejection fraction 52%    Neuro/Psych  Headaches, Anxiety Chronic pain: narcotics Bell's palsy    GI/Hepatic GERD-  Medicated and Controlled,(+) Hepatitis -, BMarkedly elevated LFTs   Endo/Other  diabetes, Type 2  Renal/GU ESRF and DialysisRenal disease (K+ 4.9)     Musculoskeletal  (+) Arthritis -,   Abdominal (+) + obese,   Peds  Hematology  (+) Blood dyscrasia (effient, Hb 10.5), ,   Anesthesia Other Findings   Reproductive/Obstetrics                            Anesthesia Physical  Anesthesia Plan  ASA: III  Anesthesia Plan: General   Post-op Pain Management:    Induction: Intravenous  Airway Management Planned:  LMA  Additional Equipment:   Intra-op Plan:   Post-operative Plan: Extubation in OR  Informed Consent: I have reviewed the patients History and Physical, chart, labs and discussed the procedure including the risks, benefits and alternatives for the proposed anesthesia with the patient or authorized representative who has indicated his/her understanding and acceptance.   Dental advisory given  Plan Discussed with: CRNA  Anesthesia Plan Comments:         Anesthesia Quick Evaluation                                   Anesthesia Evaluation  Patient identified by MRN, date of birth, ID band Patient awake    Reviewed: Allergy & Precautions, NPO status , reviewed  documented beta blocker date and time   History of Anesthesia Complications Negative for: history of anesthetic complications  Airway Mallampati: II  TM Distance: >3 FB Neck ROM: Full    Dental  (+) Edentulous Lower, Edentulous Upper   Pulmonary Current Smoker,  breath sounds clear to auscultation        Cardiovascular hypertension, + CAD, + Past MI, + Cardiac Stents and + Peripheral Vascular Disease Rhythm:Regular Rate:Normal  '15 EF 55% 4/16 stress myoview: No reversible ischemia or infarction, Hypokinesia of the inferior septal wall, Left ventricular ejection fraction 52%    Neuro/Psych  Headaches, Anxiety Chronic pain: narcotics Bell's palsy    GI/Hepatic GERD-  Medicated and Controlled,(+) Hepatitis -, BMarkedly elevated LFTs   Endo/Other  diabetes, Type 2  Renal/GU ESRF and DialysisRenal disease (K+ 4.9)     Musculoskeletal   Abdominal (+) + obese,   Peds  Hematology  (+) Blood dyscrasia (effient, Hb 10.5), ,   Anesthesia Other Findings   Reproductive/Obstetrics                            Anesthesia Physical  Anesthesia Plan  ASA: III  Anesthesia Plan: General   Post-op Pain Management:    Induction: Intravenous  Airway Management Planned:  LMA  Additional Equipment:   Intra-op Plan:   Post-operative Plan: Extubation in OR  Informed Consent: I have reviewed the patients History and Physical, chart, labs and discussed the procedure including the risks, benefits and alternatives for the proposed anesthesia with the patient or authorized representative who has indicated his/her understanding and acceptance.   Dental advisory given  Plan Discussed with: CRNA  Anesthesia Plan Comments:         Anesthesia Quick Evaluation                                   Anesthesia Evaluation  Patient identified by MRN, date of birth, ID band Patient awake    Reviewed: Allergy & Precautions, NPO status , reviewed documented beta blocker date and time   History of Anesthesia Complications (+) PONV  Airway Mallampati: II  TM Distance: >3 FB Neck ROM: Full    Dental  (+) Edentulous Lower, Edentulous Upper   Pulmonary Current Smoker,  breath sounds clear to auscultation        Cardiovascular hypertension, + CAD, + Past MI, + Cardiac Stents, + Peripheral Vascular Disease and +CHF Rhythm:Regular Rate:Normal  Echo 08/17/2014 - Left ventricle: The cavity size was normal. Wall thickness was increased in a pattern of mild LVH. Systolic function was normal. The estimated ejection fraction was in the range of 55% to 60%. Wall motion was normal; there were no regional wall motion abnormalities. Left ventricular diastolic function parameters were normal. - Mitral valve: Calcified annulus. - Right atrium: The atrium was mildly dilated.   '15 EF 55% 4/16 stress myoview: No reversible ischemia or infarction, Hypokinesia of the inferior septal wall, Left ventricular ejection fraction 52%    Neuro/Psych  Headaches, Anxiety Chronic pain: narcotics Bell's palsy    GI/Hepatic GERD-  Medicated and Controlled,(+) Hepatitis -, BMarkedly elevated LFTs   Endo/Other  diabetes, Type 2  Renal/GU ESRF and DialysisRenal disease  (K+ 4.9)     Musculoskeletal  (+) Arthritis -,   Abdominal (+) + obese,   Peds  Hematology  (+) Blood dyscrasia (effient, Hb 10.5), ,  Anesthesia Other Findings   Reproductive/Obstetrics                            Anesthesia Physical  Anesthesia Plan  ASA: III  Anesthesia Plan: General   Post-op Pain Management:    Induction: Intravenous  Airway Management Planned: LMA  Additional Equipment:   Intra-op Plan:   Post-operative Plan: Extubation in OR  Informed Consent: I have reviewed the patients History and Physical, chart, labs and discussed the procedure including the risks, benefits and alternatives for the proposed anesthesia with the patient or authorized representative who has indicated his/her understanding and acceptance.   Dental advisory given  Plan Discussed with: CRNA  Anesthesia Plan Comments:         Anesthesia Quick Evaluation

## 2014-10-31 ENCOUNTER — Telehealth: Payer: Self-pay | Admitting: Surgery

## 2014-10-31 ENCOUNTER — Telehealth: Payer: Self-pay | Admitting: Behavioral Health

## 2014-10-31 ENCOUNTER — Encounter (HOSPITAL_COMMUNITY): Payer: Self-pay | Admitting: Surgery

## 2014-10-31 NOTE — Telephone Encounter (Addendum)
-----   Message from Sharee Pimple, RN sent at 10/30/2014 11:30 AM EDT ----- Regarding: schedule   ----- Message -----    From: Lars Mage, PA-C    Sent: 10/30/2014   9:50 AM      To: [facility] Charge Pool  S/P revision of left thigh graft with wound infection.  Needs home health for wet to dry BID dressing changes.  He will f/u with Dr. Myra Gianotti in 3 weeks.  unable to reach patient by phone, he is no longer at [phone], they other two numbers do not have voice mail set up

## 2014-10-31 NOTE — Anesthesia Postprocedure Evaluation (Signed)
Anesthesia Post Note  Patient: [name]  Procedure(s) Performed: Procedure(s) (LRB): INSERTION OF LEFT THIGH GRAFT (Left) REMOVAL OF LEFT THIGH GRAFT (Left)  Anesthesia type: General  Patient location: PACU  Post pain: Pain level controlled  Post assessment: Post-op Vital signs reviewed  Last Vitals: BP 96/53 mmHg  Pulse 81  Temp(Src) 36.5 C  Resp 11  Ht 5\' 11"  (1.803 m)  Wt 295 lb 6.7 oz (134 kg)  BMI 41.22 kg/m2  SpO2 98%  Post vital signs: Reviewed  Level of consciousness: sedated  Complications: No apparent anesthesia complications

## 2014-10-31 NOTE — Telephone Encounter (Signed)
Unable to reach patient by the numbers provided, including the emergency contacts. It was reference to call the nurse as well, however when attempted to reach the caregiver, writer was informed that the patient is no longer a resident of the facility. A message could not be left at the listed numbers, due to incorrect number/no voicemail set up at this time.

## 2014-11-01 ENCOUNTER — Ambulatory Visit: Payer: Medicare Other | Admitting: Internal Medicine

## 2014-11-01 ENCOUNTER — Encounter: Payer: Medicare Other | Admitting: Medical

## 2014-11-01 NOTE — Progress Notes (Signed)
This encounter was created in error - please disregard.

## 2014-11-02 ENCOUNTER — Encounter: Payer: Self-pay | Admitting: Medical

## 2014-11-02 ENCOUNTER — Telehealth: Payer: Self-pay | Admitting: Medical

## 2014-11-02 ENCOUNTER — Encounter: Payer: Self-pay | Admitting: Nephrology

## 2014-11-02 NOTE — Telephone Encounter (Signed)
No charge. 

## 2014-11-02 NOTE — Telephone Encounter (Signed)
Pt was no show 11/01/14 9:30am, per chart I believe pt was admitted to hospital, pt has not rescheduled, mailing letter, charge no show?

## 2014-11-03 LAB — WOUND CULTURE: Culture: NO GROWTH

## 2014-11-04 LAB — ANAEROBIC CULTURE

## 2014-11-15 ENCOUNTER — Encounter: Payer: Self-pay | Admitting: Surgery

## 2014-11-19 ENCOUNTER — Ambulatory Visit: Payer: Medicare Other | Admitting: Surgery

## 2014-12-21 ENCOUNTER — Encounter: Payer: Self-pay | Admitting: Surgery

## 2014-12-24 ENCOUNTER — Encounter: Payer: Self-pay | Admitting: Surgery

## 2014-12-24 ENCOUNTER — Other Ambulatory Visit: Payer: Self-pay

## 2014-12-24 ENCOUNTER — Ambulatory Visit (INDEPENDENT_AMBULATORY_CARE_PROVIDER_SITE_OTHER): Payer: Self-pay | Admitting: Surgery

## 2014-12-24 VITALS — BP 150/89 | HR 70 | Temp 97.8°F

## 2014-12-24 DIAGNOSIS — N186 End stage renal disease: Secondary | ICD-10-CM

## 2014-12-24 NOTE — Progress Notes (Signed)
Patient name: [name] MRN: [id number] DOB: [birth year] Sex: male     Chief Complaint  Patient presents with  . Re-evaluation    Left thigh graft with wound infection. Patient states that wound is healing now and has been cleaned.     HISTORY OF PRESENT ILLNESS: The patient is back for follow-up.  He presented on 10/30/2014 with bleeding from his left thigh dialysis graft.  He underwent replacement of the lateral half of his graft as well as removal of the bleeding portion.  He continues to do wound care.  He complains of significant left leg swelling  Past Medical History  Diagnosis Date  . Anxiety   . Back pain   . GERD (gastroesophageal reflux disease)   . Peripheral neuropathy   . Arthritis   . Chronic diastolic CHF (congestive heart failure)     a. 04/2013 Echo: EF nl, no rwma, mild LVH.  Marland Kitchen Active smoker   . ESRD on hemodialysis     [name] 4 days per week on M-Tu-Wed and Fri.  Started HD in 1998 and has been on HD initially at [facility], then went to [facility], then in [facility], then to [facility] and now is at [facility] for last 10 years.  Has L thigh AVG.     . Diabetes mellitus     diet controlled  . 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 PONV (postoperative nausea and vomiting)   . Hypertension   . Bell palsy   . Carpal tunnel syndrome     bilateral  . Palpitations   . Headache(784.0)     Hx: of Migraines  . CAD (coronary artery disease)     a. 04/2013 Ant STEMI/PCI: LM nl, LAD 95p (3.0x18 Xience DES), D1/2/3 small, LCX 80ost, RI 20p, RCA 171m CTO (unsuccessful PCI), EF 55%, mod basal inf HK.  . S/P coronary artery stent placement 05/21/2013    Xience Alpine Medtronic conditional 5 @ 1.5 and 3T     Past Surgical History  Procedure Laterality Date  . Total hip arthroplasty    . Thyroidectomy    . Tooth extraction    . Mandible fracture surgery    . Dg av dialysis graft declot or    . Insertion of dialysis catheter  03/28/2011   Procedure: INSERTION OF DIALYSIS CATHETER;  Surgeon: Pryor Ochoa, MD;  Location: [facility] OR;  Service: Vascular;  Laterality: Left;  . Av fistula placement  03/31/2011    Procedure: INSERTION OF ARTERIOVENOUS (AV) GORE-TEX GRAFT THIGH;  Surgeon: Sherren Kerns, MD;  Location: [facility] OR;  Service: Vascular;  Laterality: Right;  redo right thigh arteriovenous gortex graft using gore-tex stretch 49mm x 70cm  . Thrombectomy w/ embolectomy  06/02/2011    Procedure: THROMBECTOMY ARTERIOVENOUS GORE-TEX GRAFT;  Surgeon: Chuck Hint, MD;  Location: [facility] OR;  Service: Vascular;  Laterality: Right;  Thrombectomy right thigh arteriovenous gortex graft;  revision by  replacement of large portion of graft with 69mm gore-tex   . Insertion of dialysis catheter  08/28/2011    Procedure: INSERTION OF DIALYSIS CATHETER;  Surgeon: Fransisco Hertz, MD;  Location: [facility] OR;  Service: Vascular;  Laterality: Left;  Atempted Bilateral Internal Jugular, Bilateral Subclavin insertion of 55cm Dialysis Catheter Left Femoral  . Insertion of dialysis catheter  12/15/2011    Procedure: INSERTION OF DIALYSIS CATHETER;  Surgeon: Sherren Kerns, MD;  Location: [facility] OR;  Service: Vascular;  Laterality: Right;  Insertion of Right Femoral Dialysis Catheter  . Exchange of a dialysis catheter  02/26/2012    Procedure: EXCHANGE OF A DIALYSIS CATHETER;  Surgeon: Larina Earthly, MD;  Location: [facility] OR;  Service: Vascular;  Laterality: Right;  . Joint replacement    . Exchange of a dialysis catheter  05/18/2012    Procedure: EXCHANGE OF A DIALYSIS CATHETER;  Surgeon: Larina Earthly, MD;  Location: [facility] OR;  Service: Vascular;  Laterality: Right;  right femoral dialysis catheter  . Av fistula placement  05/18/2012    Procedure: INSERTION OF ARTERIOVENOUS (AV) GORE-TEX GRAFT THIGH;  Surgeon: Larina Earthly, MD;  Location: [facility] OR;  Service: Vascular;  Laterality: Left;  using 6mm by 70cm goretex graft  . Thrombectomy w/ embolectomy Left 08/25/2012    Procedure:  THROMBECTOMY ARTERIOVENOUS GORE-TEX GRAFT;  Surgeon: Pryor Ochoa, MD;  Location: [facility] OR;  Service: Vascular;  Laterality: Left;  Attempted thrombectomy of left thigh arteriovenous gortex graft.   . Av fistula placement Left 08/25/2012    Procedure: INSERTION OF ARTERIOVENOUS (AV) GORE-TEX GRAFT THIGH;  Surgeon: Pryor Ochoa, MD;  Location: [facility] OR;  Service: Vascular;  Laterality: Left;  Using 6mm x 40cm vascular Gortex graft.  . Revision of arteriovenous goretex graft Left 12/14/2012    Procedure: Revision of Left Thigh Graft;  Surgeon: Larina Earthly, MD;  Location: [facility] OR;  Service: Vascular;  Laterality: Left;  . Avgg removal Left 02/16/2013    Procedure: EXCISION OF LEFT ARM ARTERIOVENOUS GORETEX GRAFT TIMES 2 WITH VEIN PATCH ANGIOPLASTY OF BRACIAL ARTERY.  ;  Surgeon: Chuck Hint, MD;  Location: [facility] OR;  Service: Vascular;  Laterality: Left;  Converted from MAC to General.    . Revision of arteriovenous goretex graft Left 08/08/2013    Procedure: REVISION OF LEFT THIGH ARTERIOVENOUS GORETEX GRAFT;  Surgeon: Sherren Kerns, MD;  Location: [facility] OR;  Service: Vascular;  Laterality: Left;  . Venogram Bilateral 10/27/2011    Procedure: VENOGRAM;  Surgeon: Nada Libman, MD;  Location: [facility] CATH LAB;  Service: Cardiovascular;  Laterality: Bilateral;  bilat upper extrem venograms  . Left heart cath Bilateral 05/21/2013    Procedure: LEFT HEART CATH;  Surgeon: Iran Ouch, MD;  Location: [facility] CATH LAB;  Service: Cardiovascular;  Laterality: Bilateral;  . Percutaneous coronary stent intervention (pci-s)  05/21/2013    Procedure: PERCUTANEOUS CORONARY STENT INTERVENTION (PCI-S);  Surgeon: Iran Ouch, MD;  Location: [facility] CATH LAB;  Service: Cardiovascular;;  . Coronary angioplasty with stent placement    . Radiology with anesthesia N/A 06/27/2014    Procedure: MRI LUMBER WITHOUT CONTRAST;  Surgeon: Medication Radiologist, MD;  Location: [facility] OR;  Service: Radiology;  Laterality: N/A;  . Revision of  arteriovenous goretex graft Left 08/13/2014    Procedure: REVISION OF ARTERIOVENOUS GORETEX GRAFT LEFT THIGH;  Surgeon: Sherren Kerns, MD;  Location: [facility] OR;  Service: Vascular;  Laterality: Left;  . Radiology with anesthesia N/A 08/16/2014    Procedure: MRI;  Surgeon: Medication Radiologist, MD;  Location: [facility] OR;  Service: Radiology;  Laterality: N/A;  . Thrombectomy w/ embolectomy Left 08/22/2014    Procedure: THROMBECTOMY ARTERIOVENOUS GORE-TEX GRAFT Of Left Thigh Graft;  Surgeon: Larina Earthly, MD;  Location: [facility] OR;  Service: Vascular;  Laterality: Left;  . Peripheral vascular catheterization N/A 10/09/2014    Procedure: Shuntogram;  Surgeon: Nada Libman, MD;  Location: [facility] INVASIVE CV LAB;  Service: Cardiovascular;  Laterality: N/A;  . Av fistula placement Left 10/30/2014    Procedure: INSERTION OF LEFT THIGH GRAFT;  Surgeon: Nada Libman, MD;  Location: [facility] OR;  Service: Vascular;  Laterality: Left;  . Removal of graft Left 10/30/2014    Procedure: REMOVAL OF LEFT THIGH GRAFT;  Surgeon: Nada Libman, MD;  Location: [facility] OR;  Service: Vascular;  Laterality: Left;    Social History   Social History  . Marital Status: Single    Spouse Name: N/A  . Number of Children: N/A  . Years of Education: N/A   Occupational History  . Not on file.   Social History Main Topics  . Smoking status: Current Every Day Smoker -- 0.20 packs/day for 40 years    Types: Cigarettes  . Smokeless tobacco: Never Used  . Alcohol Use: No  . Drug Use: No  . Sexual Activity: No   Other Topics Concern  . Not on file   Social History Narrative    Family History  Problem Relation Age of Onset  . Diabetes Mother   . Stroke Mother   . Heart disease Mother   . Kidney disease Father   . Hyperlipidemia Father     Allergies as of 12/24/2014  . (No Known Allergies)    Current Outpatient Prescriptions on File Prior to Visit  Medication Sig Dispense Refill  . Amino Acids-Protein Hydrolys (FEEDING  SUPPLEMENT, PRO-STAT SUGAR FREE 64,) LIQD Take 30 mLs by mouth 2 (two) times daily.    Marland Kitchen aspirin 81 MG tablet Take 1 tablet (81 mg total) by mouth daily.    Marland Kitchen atorvastatin (LIPITOR) 80 MG tablet Take 1 tablet (80 mg total) by mouth daily at 6 PM. 30 tablet 11  . B Complex-C (B-COMPLEX WITH VITAMIN C) tablet Take 1 tablet by mouth at bedtime.    . calcium carbonate (TUMS - DOSED IN MG ELEMENTAL CALCIUM) 500 MG chewable tablet Chew 3 tablets by mouth 3 (three) times daily.    . clopidogrel (PLAVIX) 75 MG tablet Take 1 tablet (75 mg total) by mouth daily. 30 tablet 0  . colchicine 0.6 MG tablet Take 0.6 mg by mouth every 12 (twelve) hours as needed (gout).     . dorzolamide-timolol (COSOPT) 22.3-6.8 MG/ML ophthalmic solution Place 1 drop into both eyes 3 (three) times daily.    Marland Kitchen escitalopram (LEXAPRO) 10 MG tablet Take 10 mg by mouth daily.    . fentaNYL (DURAGESIC - DOSED MCG/HR) 50 MCG/HR Apply one patch topically every 72 hours for pain. Remove old patch 10 patch 0  . gabapentin (NEURONTIN) 100 MG capsule Take 100 mg by mouth every 12 (twelve) hours as needed (neuropathy).     . latanoprost (XALATAN) 0.005 % ophthalmic solution Place 1 drop into both eyes at bedtime.   2  . loperamide (IMODIUM A-D) 2 MG tablet Take 4 mg by mouth every 4 (four) hours as needed for diarrhea or loose stools.    Marland Kitchen LORazepam (ATIVAN) 0.5 MG tablet Take 1 tablet (0.5 mg total) by mouth every 8 (eight) hours as needed for anxiety. 30 tablet 0  . Menthol, Topical Analgesic, 4 % GEL Apply 1 application topically every 6 (six) hours as needed (pain). Apply to shoulders/lower back    . nitroGLYCERIN (NITROSTAT) 0.4 MG SL tablet Place 1 tablet (0.4 mg total) under the tongue every 5 (five) minutes as needed for chest pain. 25 tablet 3  . omeprazole (PRILOSEC) 40 MG capsule Take 40 mg  by mouth at bedtime.     Marland Kitchen oxyCODONE (OXY IR/ROXICODONE) 5 MG immediate release tablet Take 1-2 tablets (5-10 mg total) by mouth every 4 (four)  hours as needed for severe pain. Take 5 or 10 mg every 4 hours as needed for pain AND take 10 mg prior to dialysis 30 tablet 0  . sodium bicarbonate 650 MG tablet Take 650 mg by mouth 2 (two) times daily.      . sulindac (CLINORIL) 200 MG tablet Take 200 mg by mouth 2 (two) times daily.    . vancomycin (VANCOCIN) 1 GM/200ML SOLN Inject 200 mLs (1,000 mg total) into the vein every dialysis. 4000 mL 5   No current facility-administered medications on file prior to visit.     REVIEW OF SYSTEMS: Cardiovascular: Left leg swelling Pulmonary: No productive cough, asthma or wheezing. Neurologic: No weakness, paresthesias, aphasia, or amaurosis. No dizziness. Hematologic: No bleeding problems or clotting disorders. Musculoskeletal: No joint pain or joint swelling. Gastrointestinal: No blood in stool or hematemesis Genitourinary: No dysuria or hematuria. Psychiatric:: No history of major depression. Integumentary: Dressing changes to left groin wound. Constitutional: No fever or chills.  PHYSICAL EXAMINATION:   Vital signs are  Filed Vitals:   12/24/14 0900  BP: 150/89  Pulse: 70  Temp: 97.8 F (36.6 C)  TempSrc: Oral  SpO2: 98%   There is no weight on file to calculate BMI. General: The patient appears their stated age. HEENT:  No gross abnormalities Pulmonary:  Non labored breathing Musculoskeletal: There are no major deformities. Neurologic: No focal weakness or paresthesias are detected, Skin: Retained suture in the left thigh graft which was removed Psychiatric: The patient has normal affect. Cardiovascular: Functioning left thigh graft with significant left leg swelling   Diagnostic Studies None  Assessment: End stage renal disease Plan: The patient presented with bleeding from a left thigh graft.  He continues to have left leg swelling.  He needs to undergo a fistulogram to see if he has any venous outflow stenosis causing his swelling.  This will be scheduled for  Tuesday, September 6  V. Charlena Cross, M.D. [facility] Office: [phone] Pager:  [phone]

## 2014-12-26 NOTE — Patient Outreach (Signed)
[facility]) Care Management  12/26/2014  LASHAN SEGNER 12-May-1955 [id number]   Referral from High Risk List, assigned Wyatt Haste, RN to outreach.  Thanks, Corrie Mckusick. Sharlee Blew [facility] Care Management [facility] CM Assistant Phone: [phone] Fax: [phone]

## 2015-01-01 ENCOUNTER — Encounter (HOSPITAL_COMMUNITY)
Admission: RE | Disposition: A | Discharge status: Home / Self Care | Payer: Medicare Other | Source: Ambulatory Visit | Attending: Surgery

## 2015-01-01 ENCOUNTER — Encounter (HOSPITAL_COMMUNITY): Payer: Self-pay | Admitting: Surgery

## 2015-01-01 ENCOUNTER — Ambulatory Visit (HOSPITAL_COMMUNITY)
Admission: RE | Admit: 2015-01-01 | Discharge: 2015-01-01 | Disposition: A | Discharge status: Home / Self Care | Payer: Medicare Other | Source: Ambulatory Visit | Attending: Surgery | Admitting: Surgery

## 2015-01-01 DIAGNOSIS — K219 Gastro-esophageal reflux disease without esophagitis: Secondary | ICD-10-CM | POA: Diagnosis not present

## 2015-01-01 DIAGNOSIS — Z7982 Long term (current) use of aspirin: Secondary | ICD-10-CM | POA: Diagnosis not present

## 2015-01-01 DIAGNOSIS — N186 End stage renal disease: Secondary | ICD-10-CM | POA: Diagnosis not present

## 2015-01-01 DIAGNOSIS — E1140 Type 2 diabetes mellitus with diabetic neuropathy, unspecified: Secondary | ICD-10-CM | POA: Diagnosis not present

## 2015-01-01 DIAGNOSIS — T82898A Other specified complication of vascular prosthetic devices, implants and grafts, initial encounter: Secondary | ICD-10-CM | POA: Diagnosis not present

## 2015-01-01 DIAGNOSIS — I252 Old myocardial infarction: Secondary | ICD-10-CM | POA: Diagnosis not present

## 2015-01-01 DIAGNOSIS — F419 Anxiety disorder, unspecified: Secondary | ICD-10-CM | POA: Diagnosis not present

## 2015-01-01 DIAGNOSIS — Z992 Dependence on renal dialysis: Secondary | ICD-10-CM | POA: Insufficient documentation

## 2015-01-01 DIAGNOSIS — M7989 Other specified soft tissue disorders: Secondary | ICD-10-CM | POA: Diagnosis present

## 2015-01-01 DIAGNOSIS — Z955 Presence of coronary angioplasty implant and graft: Secondary | ICD-10-CM | POA: Insufficient documentation

## 2015-01-01 DIAGNOSIS — I2510 Atherosclerotic heart disease of native coronary artery without angina pectoris: Secondary | ICD-10-CM | POA: Insufficient documentation

## 2015-01-01 DIAGNOSIS — Z7902 Long term (current) use of antithrombotics/antiplatelets: Secondary | ICD-10-CM | POA: Diagnosis not present

## 2015-01-01 DIAGNOSIS — I5032 Chronic diastolic (congestive) heart failure: Secondary | ICD-10-CM | POA: Diagnosis not present

## 2015-01-01 DIAGNOSIS — G43909 Migraine, unspecified, not intractable, without status migrainosus: Secondary | ICD-10-CM | POA: Diagnosis not present

## 2015-01-01 DIAGNOSIS — E1122 Type 2 diabetes mellitus with diabetic chronic kidney disease: Secondary | ICD-10-CM | POA: Diagnosis not present

## 2015-01-01 DIAGNOSIS — I120 Hypertensive chronic kidney disease with stage 5 chronic kidney disease or end stage renal disease: Secondary | ICD-10-CM | POA: Insufficient documentation

## 2015-01-01 DIAGNOSIS — Z4901 Encounter for fitting and adjustment of extracorporeal dialysis catheter: Secondary | ICD-10-CM | POA: Diagnosis not present

## 2015-01-01 DIAGNOSIS — F17210 Nicotine dependence, cigarettes, uncomplicated: Secondary | ICD-10-CM | POA: Insufficient documentation

## 2015-01-01 HISTORY — PX: PERIPHERAL VASCULAR CATHETERIZATION: SHX172C

## 2015-01-01 LAB — POCT I-STAT, CHEM 8
BUN: 41 mg/dL — ABNORMAL HIGH (ref 6–20)
CALCIUM ION: 0.91 mmol/L — AB (ref 1.12–1.23)
CREATININE: 5 mg/dL — AB (ref 0.61–1.24)
Chloride: 102 mmol/L (ref 101–111)
GLUCOSE: 89 mg/dL (ref 65–99)
HEMATOCRIT: 44 % (ref 39.0–52.0)
HEMOGLOBIN: 15 g/dL (ref 13.0–17.0)
Potassium: 5.8 mmol/L — ABNORMAL HIGH (ref 3.5–5.1)
Sodium: 132 mmol/L — ABNORMAL LOW (ref 135–145)
TCO2: 20 mmol/L (ref 0–100)

## 2015-01-01 SURGERY — A/V SHUNTOGRAM/FISTULAGRAM
Site: Thigh | Laterality: Left

## 2015-01-01 MED ORDER — MIDAZOLAM HCL 2 MG/2ML IJ SOLN
INTRAMUSCULAR | Status: DC | PRN
  Administered 2015-01-01 (×3): 1 mg via INTRAVENOUS

## 2015-01-01 MED ORDER — ACETAMINOPHEN 325 MG RE SUPP: 325.0000 mg | RECTAL | Status: DC | PRN

## 2015-01-01 MED ORDER — MIDAZOLAM HCL 2 MG/2ML IJ SOLN
INTRAMUSCULAR | Status: AC
  Filled 2015-01-01: qty 4

## 2015-01-01 MED ORDER — ACETAMINOPHEN 325 MG PO TABS: 325.0000 mg | ORAL_TABLET | ORAL | Status: DC | PRN

## 2015-01-01 MED ORDER — HEPARIN (PORCINE) IN NACL 2-0.9 UNIT/ML-% IJ SOLN
INTRAMUSCULAR | Status: AC
  Filled 2015-01-01: qty 500

## 2015-01-01 MED ORDER — FENTANYL CITRATE (PF) 100 MCG/2ML IJ SOLN
INTRAMUSCULAR | Status: DC | PRN
  Administered 2015-01-01 (×3): 25 ug via INTRAVENOUS

## 2015-01-01 MED ORDER — LIDOCAINE HCL (PF) 1 % IJ SOLN
INTRAMUSCULAR | Status: AC
  Filled 2015-01-01: qty 30

## 2015-01-01 MED ORDER — FENTANYL CITRATE (PF) 100 MCG/2ML IJ SOLN
INTRAMUSCULAR | Status: AC
  Filled 2015-01-01: qty 4

## 2015-01-01 MED ORDER — IODIXANOL 320 MG/ML IV SOLN
INTRAVENOUS | Status: DC | PRN
  Administered 2015-01-01: 35 mL via INTRAVENOUS

## 2015-01-01 MED ORDER — DOCUSATE SODIUM 100 MG PO CAPS: 100.0000 mg | ORAL_CAPSULE | Freq: Every day | ORAL | Status: DC

## 2015-01-01 MED ORDER — LABETALOL HCL 5 MG/ML IV SOLN: 10.0000 mg | INTRAVENOUS | Status: DC | PRN

## 2015-01-01 MED ORDER — HYDROMORPHONE HCL 1 MG/ML IJ SOLN: 0.5000 mg | INTRAMUSCULAR | Status: DC | PRN

## 2015-01-01 MED ORDER — SODIUM CHLORIDE 0.9 % IJ SOLN: 3.0000 mL | INTRAMUSCULAR | Status: DC | PRN

## 2015-01-01 MED ORDER — OXYCODONE HCL 5 MG PO TABS: 5.0000 mg | ORAL_TABLET | ORAL | Status: DC | PRN

## 2015-01-01 MED ORDER — ONDANSETRON HCL 4 MG/2ML IJ SOLN: 4.0000 mg | Freq: Four times a day (QID) | INTRAMUSCULAR | Status: DC | PRN

## 2015-01-01 MED ORDER — HYDRALAZINE HCL 20 MG/ML IJ SOLN: 5.0000 mg | INTRAMUSCULAR | Status: DC | PRN

## 2015-01-01 SURGICAL SUPPLY — 7 items
COVER PRB 48X5XTLSCP FOLD TPE (BAG) ×1 IMPLANT
COVER PROBE 5X48 (BAG) ×2
KIT MICROINTRODUCER STIFF 5F (SHEATH) ×3 IMPLANT
KIT PV (KITS) ×3 IMPLANT
STOPCOCK MORSE 400PSI 3WAY (MISCELLANEOUS) ×3 IMPLANT
TRAY PV CATH (CUSTOM PROCEDURE TRAY) ×3 IMPLANT
TUBING CIL FLEX 10 FLL-RA (TUBING) ×3 IMPLANT

## 2015-01-01 NOTE — Op Note (Signed)
    Patient name: [name] MRN: [id number] DOB: [birth year] Sex: male  01/01/2015 Pre-operative Diagnosis: Leg swelling Post-operative diagnosis:  Same Surgeon:  Durene Cal Procedure Performed:  1.  Ultrasound-guided access, left thigh dialysis graft  2.  Shuntogram \   Indications:  The patient has had multiple revisions in his left thigh graft.  He has presented to the emergency department with bleeding several times.  Each time his graft was revised.  He still has a wound on the lateral side of his leg.  His biggest complaint has been that of leg swelling.  I elected to bring him in today for shuntogram to evaluate for venous outflow stenosis.  Procedure:  The patient was identified in the holding area and taken to room 8.  The patient was then placed supine on the table and prepped and draped in the usual sterile fashion.  A time out was called.  Ultrasound was used to evaluate the fistula.  The vein was patent and compressible.  A digital ultrasound image was acquired.  The fistula was then accessed under ultrasound guidance using a micropuncture needle.  An 018 wire was then asvanced without resistance and a micropuncture sheath was placed.  Contrast injections were then performed through the sheath.  Findings:  Only the medial/venous half of the graft was visualized today this was widely patent.  The anastomosis to the common femoral vein is widely patent.  There is diffuse narrowing of the common femoral vein of approximately 30-40 percent.  The remaining portion of the iliac venous system and the vena cava were widely patent   Intervention:  None  Impression:  #1  no significant venous outflow stenosis to explain his leg swelling     V. Durene Cal, M.D. [facility] Office: [phone] Pager:  [phone]

## 2015-01-01 NOTE — Discharge Instructions (Signed)
Fistulogram, Care After °Refer to this sheet in the next few weeks. These instructions provide you with information on caring for yourself after your procedure. Your health care provider may also give you more specific instructions. Your treatment has been planned according to current medical practices, but problems sometimes occur. Call your health care provider if you have any problems or questions after your procedure. °WHAT TO EXPECT AFTER THE PROCEDURE °After your procedure, it is typical to have the following: °· A small amount of discomfort in the area where the catheters were placed. °· A small amount of bruising around the fistula. °· Sleepiness and fatigue. °HOME CARE INSTRUCTIONS °· Rest at home for the day following your procedure. °· Do not drive or operate heavy machinery while taking pain medicine. °· Take medicines only as directed by your health care provider. °· Do not take baths, swim, or use a hot tub until your health care provider approves. You may shower 24 hours after the procedure or as directed by your health care provider. °· There are many different ways to close and cover an incision, including stitches, skin glue, and adhesive strips. Follow your health care provider's instructions on: °¨ Incision care. °¨ Bandage (dressing) changes and removal. °¨ Incision closure removal. °· Monitor your dialysis fistula carefully. °SEEK MEDICAL CARE IF: °· You have drainage, redness, swelling, or pain at your catheter site. °· You have a fever. °· You have chills. °SEEK IMMEDIATE MEDICAL CARE IF: °· You feel weak. °· You have trouble balancing. °· You have trouble moving your arms or legs. °· You have problems with your speech or vision. °· You can no longer feel a vibration or buzz when you put your fingers over your dialysis fistula. °· The limb that was used for the procedure: °¨ Swells. °¨ Is painful. °¨ Is cold. °¨ Is discolored, such as blue or pale white. °Document Released: 08/28/2013  Document Reviewed: 06/02/2013 °Exit[employer]® Patient Information ©2015 [employer]. This information is not intended to replace advice given to you by your health care provider. Make sure you discuss any questions you have with your health care provider. ° °

## 2015-01-01 NOTE — Interval H&P Note (Signed)
History and Physical Interval Note:  01/01/2015 8:24 AM  [name]  has presented today for surgery, with the diagnosis of instage renal   The various methods of treatment have been discussed with the patient and family. After consideration of risks, benefits and other options for treatment, the patient has consented to  Procedure(s):  Shuntogram (N/A) as a surgical intervention .  The patient's history has been reviewed, patient examined, no change in status, stable for surgery.  I have reviewed the patient's chart and labs.  Questions were answered to the patient's satisfaction.     [name], [place]  Still c/o pain  wb

## 2015-01-01 NOTE — H&P (View-Only) (Signed)
Patient name: [name] MRN: [id number] DOB: [birth year] Sex: male     Chief Complaint  Patient presents with  . Re-evaluation    Left thigh graft with wound infection. Patient states that wound is healing now and has been cleaned.     HISTORY OF PRESENT ILLNESS: The patient is back for follow-up.  He presented on 10/30/2014 with bleeding from his left thigh dialysis graft.  He underwent replacement of the lateral half of his graft as well as removal of the bleeding portion.  He continues to do wound care.  He complains of significant left leg swelling  Past Medical History  Diagnosis Date  . Anxiety   . Back pain   . GERD (gastroesophageal reflux disease)   . Peripheral neuropathy   . Arthritis   . Chronic diastolic CHF (congestive heart failure)     a. 04/2013 Echo: EF nl, no rwma, mild LVH.  Marland Kitchen Active smoker   . ESRD on hemodialysis     [name] 4 days per week on M-Tu-Wed and Fri.  Started HD in 1998 and has been on HD initially at [facility], then went to [facility], then in [facility], then to [facility] and now is at [facility] for last 10 years.  Has L thigh AVG.     . Diabetes mellitus     diet controlled  . 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 PONV (postoperative nausea and vomiting)   . Hypertension   . Bell palsy   . Carpal tunnel syndrome     bilateral  . Palpitations   . Headache(784.0)     Hx: of Migraines  . CAD (coronary artery disease)     a. 04/2013 Ant STEMI/PCI: LM nl, LAD 95p (3.0x18 Xience DES), D1/2/3 small, LCX 80ost, RI 20p, RCA 171m CTO (unsuccessful PCI), EF 55%, mod basal inf HK.  . S/P coronary artery stent placement 05/21/2013    Xience Alpine Medtronic conditional 5 @ 1.5 and 3T     Past Surgical History  Procedure Laterality Date  . Total hip arthroplasty    . Thyroidectomy    . Tooth extraction    . Mandible fracture surgery    . Dg av dialysis graft declot or    . Insertion of dialysis catheter  03/28/2011   Procedure: INSERTION OF DIALYSIS CATHETER;  Surgeon: Pryor Ochoa, MD;  Location: [facility] OR;  Service: Vascular;  Laterality: Left;  . Av fistula placement  03/31/2011    Procedure: INSERTION OF ARTERIOVENOUS (AV) GORE-TEX GRAFT THIGH;  Surgeon: Sherren Kerns, MD;  Location: [facility] OR;  Service: Vascular;  Laterality: Right;  redo right thigh arteriovenous gortex graft using gore-tex stretch 49mm x 70cm  . Thrombectomy w/ embolectomy  06/02/2011    Procedure: THROMBECTOMY ARTERIOVENOUS GORE-TEX GRAFT;  Surgeon: Chuck Hint, MD;  Location: [facility] OR;  Service: Vascular;  Laterality: Right;  Thrombectomy right thigh arteriovenous gortex graft;  revision by  replacement of large portion of graft with 69mm gore-tex   . Insertion of dialysis catheter  08/28/2011    Procedure: INSERTION OF DIALYSIS CATHETER;  Surgeon: Fransisco Hertz, MD;  Location: [facility] OR;  Service: Vascular;  Laterality: Left;  Atempted Bilateral Internal Jugular, Bilateral Subclavin insertion of 55cm Dialysis Catheter Left Femoral  . Insertion of dialysis catheter  12/15/2011    Procedure: INSERTION OF DIALYSIS CATHETER;  Surgeon: Sherren Kerns, MD;  Location: [facility] OR;  Service: Vascular;  Laterality: Right;  Insertion of Right Femoral Dialysis Catheter  . Exchange of a dialysis catheter  02/26/2012    Procedure: EXCHANGE OF A DIALYSIS CATHETER;  Surgeon: Larina Earthly, MD;  Location: [facility] OR;  Service: Vascular;  Laterality: Right;  . Joint replacement    . Exchange of a dialysis catheter  05/18/2012    Procedure: EXCHANGE OF A DIALYSIS CATHETER;  Surgeon: Larina Earthly, MD;  Location: [facility] OR;  Service: Vascular;  Laterality: Right;  right femoral dialysis catheter  . Av fistula placement  05/18/2012    Procedure: INSERTION OF ARTERIOVENOUS (AV) GORE-TEX GRAFT THIGH;  Surgeon: Larina Earthly, MD;  Location: [facility] OR;  Service: Vascular;  Laterality: Left;  using 6mm by 70cm goretex graft  . Thrombectomy w/ embolectomy Left 08/25/2012    Procedure:  THROMBECTOMY ARTERIOVENOUS GORE-TEX GRAFT;  Surgeon: Pryor Ochoa, MD;  Location: [facility] OR;  Service: Vascular;  Laterality: Left;  Attempted thrombectomy of left thigh arteriovenous gortex graft.   . Av fistula placement Left 08/25/2012    Procedure: INSERTION OF ARTERIOVENOUS (AV) GORE-TEX GRAFT THIGH;  Surgeon: Pryor Ochoa, MD;  Location: [facility] OR;  Service: Vascular;  Laterality: Left;  Using 6mm x 40cm vascular Gortex graft.  . Revision of arteriovenous goretex graft Left 12/14/2012    Procedure: Revision of Left Thigh Graft;  Surgeon: Larina Earthly, MD;  Location: [facility] OR;  Service: Vascular;  Laterality: Left;  . Avgg removal Left 02/16/2013    Procedure: EXCISION OF LEFT ARM ARTERIOVENOUS GORETEX GRAFT TIMES 2 WITH VEIN PATCH ANGIOPLASTY OF BRACIAL ARTERY.  ;  Surgeon: Chuck Hint, MD;  Location: [facility] OR;  Service: Vascular;  Laterality: Left;  Converted from MAC to General.    . Revision of arteriovenous goretex graft Left 08/08/2013    Procedure: REVISION OF LEFT THIGH ARTERIOVENOUS GORETEX GRAFT;  Surgeon: Sherren Kerns, MD;  Location: [facility] OR;  Service: Vascular;  Laterality: Left;  . Venogram Bilateral 10/27/2011    Procedure: VENOGRAM;  Surgeon: Nada Libman, MD;  Location: [facility] CATH LAB;  Service: Cardiovascular;  Laterality: Bilateral;  bilat upper extrem venograms  . Left heart cath Bilateral 05/21/2013    Procedure: LEFT HEART CATH;  Surgeon: Iran Ouch, MD;  Location: [facility] CATH LAB;  Service: Cardiovascular;  Laterality: Bilateral;  . Percutaneous coronary stent intervention (pci-s)  05/21/2013    Procedure: PERCUTANEOUS CORONARY STENT INTERVENTION (PCI-S);  Surgeon: Iran Ouch, MD;  Location: [facility] CATH LAB;  Service: Cardiovascular;;  . Coronary angioplasty with stent placement    . Radiology with anesthesia N/A 06/27/2014    Procedure: MRI LUMBER WITHOUT CONTRAST;  Surgeon: Medication Radiologist, MD;  Location: [facility] OR;  Service: Radiology;  Laterality: N/A;  . Revision of  arteriovenous goretex graft Left 08/13/2014    Procedure: REVISION OF ARTERIOVENOUS GORETEX GRAFT LEFT THIGH;  Surgeon: Sherren Kerns, MD;  Location: [facility] OR;  Service: Vascular;  Laterality: Left;  . Radiology with anesthesia N/A 08/16/2014    Procedure: MRI;  Surgeon: Medication Radiologist, MD;  Location: [facility] OR;  Service: Radiology;  Laterality: N/A;  . Thrombectomy w/ embolectomy Left 08/22/2014    Procedure: THROMBECTOMY ARTERIOVENOUS GORE-TEX GRAFT Of Left Thigh Graft;  Surgeon: Larina Earthly, MD;  Location: [facility] OR;  Service: Vascular;  Laterality: Left;  . Peripheral vascular catheterization N/A 10/09/2014    Procedure: Shuntogram;  Surgeon: Nada Libman, MD;  Location: [facility] INVASIVE CV LAB;  Service: Cardiovascular;  Laterality: N/A;  . Av fistula placement Left 10/30/2014    Procedure: INSERTION OF LEFT THIGH GRAFT;  Surgeon: Nada Libman, MD;  Location: [facility] OR;  Service: Vascular;  Laterality: Left;  . Removal of graft Left 10/30/2014    Procedure: REMOVAL OF LEFT THIGH GRAFT;  Surgeon: Nada Libman, MD;  Location: [facility] OR;  Service: Vascular;  Laterality: Left;    Social History   Social History  . Marital Status: Single    Spouse Name: N/A  . Number of Children: N/A  . Years of Education: N/A   Occupational History  . Not on file.   Social History Main Topics  . Smoking status: Current Every Day Smoker -- 0.20 packs/day for 40 years    Types: Cigarettes  . Smokeless tobacco: Never Used  . Alcohol Use: No  . Drug Use: No  . Sexual Activity: No   Other Topics Concern  . Not on file   Social History Narrative    Family History  Problem Relation Age of Onset  . Diabetes Mother   . Stroke Mother   . Heart disease Mother   . Kidney disease Father   . Hyperlipidemia Father     Allergies as of 12/24/2014  . (No Known Allergies)    Current Outpatient Prescriptions on File Prior to Visit  Medication Sig Dispense Refill  . Amino Acids-Protein Hydrolys (FEEDING  SUPPLEMENT, PRO-STAT SUGAR FREE 64,) LIQD Take 30 mLs by mouth 2 (two) times daily.    Marland Kitchen aspirin 81 MG tablet Take 1 tablet (81 mg total) by mouth daily.    Marland Kitchen atorvastatin (LIPITOR) 80 MG tablet Take 1 tablet (80 mg total) by mouth daily at 6 PM. 30 tablet 11  . B Complex-C (B-COMPLEX WITH VITAMIN C) tablet Take 1 tablet by mouth at bedtime.    . calcium carbonate (TUMS - DOSED IN MG ELEMENTAL CALCIUM) 500 MG chewable tablet Chew 3 tablets by mouth 3 (three) times daily.    . clopidogrel (PLAVIX) 75 MG tablet Take 1 tablet (75 mg total) by mouth daily. 30 tablet 0  . colchicine 0.6 MG tablet Take 0.6 mg by mouth every 12 (twelve) hours as needed (gout).     . dorzolamide-timolol (COSOPT) 22.3-6.8 MG/ML ophthalmic solution Place 1 drop into both eyes 3 (three) times daily.    Marland Kitchen escitalopram (LEXAPRO) 10 MG tablet Take 10 mg by mouth daily.    . fentaNYL (DURAGESIC - DOSED MCG/HR) 50 MCG/HR Apply one patch topically every 72 hours for pain. Remove old patch 10 patch 0  . gabapentin (NEURONTIN) 100 MG capsule Take 100 mg by mouth every 12 (twelve) hours as needed (neuropathy).     . latanoprost (XALATAN) 0.005 % ophthalmic solution Place 1 drop into both eyes at bedtime.   2  . loperamide (IMODIUM A-D) 2 MG tablet Take 4 mg by mouth every 4 (four) hours as needed for diarrhea or loose stools.    Marland Kitchen LORazepam (ATIVAN) 0.5 MG tablet Take 1 tablet (0.5 mg total) by mouth every 8 (eight) hours as needed for anxiety. 30 tablet 0  . Menthol, Topical Analgesic, 4 % GEL Apply 1 application topically every 6 (six) hours as needed (pain). Apply to shoulders/lower back    . nitroGLYCERIN (NITROSTAT) 0.4 MG SL tablet Place 1 tablet (0.4 mg total) under the tongue every 5 (five) minutes as needed for chest pain. 25 tablet 3  . omeprazole (PRILOSEC) 40 MG capsule Take 40 mg  by mouth at bedtime.     Marland Kitchen oxyCODONE (OXY IR/ROXICODONE) 5 MG immediate release tablet Take 1-2 tablets (5-10 mg total) by mouth every 4 (four)  hours as needed for severe pain. Take 5 or 10 mg every 4 hours as needed for pain AND take 10 mg prior to dialysis 30 tablet 0  . sodium bicarbonate 650 MG tablet Take 650 mg by mouth 2 (two) times daily.      . sulindac (CLINORIL) 200 MG tablet Take 200 mg by mouth 2 (two) times daily.    . vancomycin (VANCOCIN) 1 GM/200ML SOLN Inject 200 mLs (1,000 mg total) into the vein every dialysis. 4000 mL 5   No current facility-administered medications on file prior to visit.     REVIEW OF SYSTEMS: Cardiovascular: Left leg swelling Pulmonary: No productive cough, asthma or wheezing. Neurologic: No weakness, paresthesias, aphasia, or amaurosis. No dizziness. Hematologic: No bleeding problems or clotting disorders. Musculoskeletal: No joint pain or joint swelling. Gastrointestinal: No blood in stool or hematemesis Genitourinary: No dysuria or hematuria. Psychiatric:: No history of major depression. Integumentary: Dressing changes to left groin wound. Constitutional: No fever or chills.  PHYSICAL EXAMINATION:   Vital signs are  Filed Vitals:   12/24/14 0900  BP: 150/89  Pulse: 70  Temp: 97.8 F (36.6 C)  TempSrc: Oral  SpO2: 98%   There is no weight on file to calculate BMI. General: The patient appears their stated age. HEENT:  No gross abnormalities Pulmonary:  Non labored breathing Musculoskeletal: There are no major deformities. Neurologic: No focal weakness or paresthesias are detected, Skin: Retained suture in the left thigh graft which was removed Psychiatric: The patient has normal affect. Cardiovascular: Functioning left thigh graft with significant left leg swelling   Diagnostic Studies None  Assessment: End stage renal disease Plan: The patient presented with bleeding from a left thigh graft.  He continues to have left leg swelling.  He needs to undergo a fistulogram to see if he has any venous outflow stenosis causing his swelling.  This will be scheduled for  Tuesday, September 6  V. Charlena Cross, M.D. [facility] Office: [phone] Pager:  [phone]

## 2015-01-02 ENCOUNTER — Other Ambulatory Visit: Payer: Self-pay | Admitting: *Deleted

## 2015-01-02 MED FILL — Lidocaine HCl Local Preservative Free (PF) Inj 1%: INTRAMUSCULAR | Qty: 30 | Status: AC

## 2015-01-02 MED FILL — Heparin Sodium (Porcine) 2 Unit/ML in Sodium Chloride 0.9%: INTRAMUSCULAR | Qty: 500 | Status: AC

## 2015-01-02 NOTE — Patient Outreach (Signed)
[facility]) Care Management  01/02/2015  [name] 06-12-1955 [id number]   Assessment: Initial patient outreach call Patient is a high risk list referral. Call placed to patient on both telephone numbers provided but are not working.  Plan: Will continue to attempt to reach patient.  Develle Sievers A. Adean Milosevic, BSN, RN-BC [facility] Community Care Management Coordinator Cell: [phone]

## 2015-01-04 ENCOUNTER — Other Ambulatory Visit: Payer: Self-pay | Admitting: *Deleted

## 2015-01-04 NOTE — Patient Outreach (Signed)
[facility]) Care Management  01/04/2015  CORBET WEITMAN 1955/12/12 [id number]    Assessment: Telephone screen Patient is a high risk list referral. Call placed to patient but unable to reach him. Mobile phone number provided is not working.  Plan: Will continue to reach patient. Will schedule patient for next outreach call.  Abhijay Morriss A. Tania Steinhauser, BSN, RN-BC [facility] Community Care Management Coordinator Cell: [phone]

## 2015-01-07 ENCOUNTER — Other Ambulatory Visit: Payer: Self-pay | Admitting: *Deleted

## 2015-01-07 NOTE — Patient Outreach (Addendum)
[facility]) Care Management  01/07/2015  YOVANNI MCGLINCHEY 28-Nov-1955 [id number]   Assessment: Telephone screen- 3rd attempt Patient is a High Risk Referral. Patient is a resident at [facility]. Call placed to [facility] (phone number provided for patient) and spoke to [place], [place]. Care management coordinator requested to speak to patient. Nurse went to find patient and reports that patient is outside and he asked to call back care management coordinator. Name and contact number provided.  Plan: Will await for patient to call back. If unable to receive a call back, will try to call patient again.  Will send a Patient Outreach Letter if still unable to contact patient.  Meghin Thivierge A. Dequincy Born, BSN, RN-BC [facility] Community Care Management Coordinator Cell: [phone]

## 2015-01-09 ENCOUNTER — Other Ambulatory Visit: Payer: Self-pay | Admitting: *Deleted

## 2015-01-09 ENCOUNTER — Encounter: Payer: Self-pay | Admitting: *Deleted

## 2015-01-09 NOTE — Patient Outreach (Signed)
[facility]) Care Management  01/09/2015  [name] [birth year] [id number]   Assessment: Unsuccessful attempts for care coordination Unable to receive any call back from patient from message left with name and contact information provided.  Call placed to [facility] (where patient is a resident) to speak with patient but he is not available.  Plan: Will send Patient Outreach Letter and wait for response. If unable to hear from patient, will close case.  Saphyre Cillo A. Melissa Pulido, BSN, RN-BC [facility] Community Care Management Coordinator Cell: [phone]

## 2015-02-05 ENCOUNTER — Encounter: Payer: Self-pay | Admitting: *Deleted

## 2015-02-05 ENCOUNTER — Other Ambulatory Visit: Payer: Self-pay | Admitting: *Deleted

## 2015-02-05 NOTE — Patient Outreach (Signed)
[facility]) Care Management  02/05/2015  GENE WEATHERBY [birth year] [id number]   Assessment: Telephone screen- unsuccessful contact- Case closure Unable to receive any return call to messages left for patient and unable to establish contact with patient. No response received from Patient Outreach Letter sent on 01/10/15.  Plan: Will close case. Will notify primary care provider of case closure.  Selwyn Reason A. Veleta Yamamoto, BSN, RN-BC [facility] Community Care Management Coordinator Cell: [phone]

## 2015-02-14 NOTE — Patient Outreach (Signed)
[facility]) Care Management  02/14/2015  [name] 05/31/1955 [id number]   Notification from Wyatt Haste, RN to close case due to unable to contact patient for [facility] Care Management services.  Thanks, Corrie Mckusick. Sharlee Blew [facility] Care Management [facility] CM Assistant Phone: [phone] Fax: [phone]

## 2015-02-18 ENCOUNTER — Emergency Department (HOSPITAL_COMMUNITY): Payer: Medicare Other

## 2015-02-18 ENCOUNTER — Encounter (HOSPITAL_COMMUNITY): Payer: Self-pay

## 2015-02-18 ENCOUNTER — Observation Stay (HOSPITAL_COMMUNITY): Payer: Medicare Other

## 2015-02-18 ENCOUNTER — Inpatient Hospital Stay (HOSPITAL_COMMUNITY)
Admission: EM | Admit: 2015-02-18 | Discharge: 2015-02-22 | DRG: 193 | Disposition: A | Discharge status: Skilled Nursing Facility | Payer: Medicare Other | Attending: Internal Medicine | Admitting: Internal Medicine

## 2015-02-18 DIAGNOSIS — G9340 Encephalopathy, unspecified: Secondary | ICD-10-CM | POA: Diagnosis not present

## 2015-02-18 DIAGNOSIS — N186 End stage renal disease: Secondary | ICD-10-CM | POA: Diagnosis present

## 2015-02-18 DIAGNOSIS — G8929 Other chronic pain: Secondary | ICD-10-CM | POA: Diagnosis present

## 2015-02-18 DIAGNOSIS — Z79899 Other long term (current) drug therapy: Secondary | ICD-10-CM

## 2015-02-18 DIAGNOSIS — E119 Type 2 diabetes mellitus without complications: Secondary | ICD-10-CM | POA: Diagnosis present

## 2015-02-18 DIAGNOSIS — I739 Peripheral vascular disease, unspecified: Secondary | ICD-10-CM | POA: Diagnosis present

## 2015-02-18 DIAGNOSIS — Z720 Tobacco use: Secondary | ICD-10-CM | POA: Insufficient documentation

## 2015-02-18 DIAGNOSIS — J189 Pneumonia, unspecified organism: Principal | ICD-10-CM | POA: Diagnosis present

## 2015-02-18 DIAGNOSIS — I509 Heart failure, unspecified: Secondary | ICD-10-CM

## 2015-02-18 DIAGNOSIS — F419 Anxiety disorder, unspecified: Secondary | ICD-10-CM | POA: Diagnosis present

## 2015-02-18 DIAGNOSIS — I10 Essential (primary) hypertension: Secondary | ICD-10-CM

## 2015-02-18 DIAGNOSIS — B182 Chronic viral hepatitis C: Secondary | ICD-10-CM | POA: Diagnosis present

## 2015-02-18 DIAGNOSIS — M79671 Pain in right foot: Secondary | ICD-10-CM | POA: Diagnosis present

## 2015-02-18 DIAGNOSIS — J3489 Other specified disorders of nose and nasal sinuses: Secondary | ICD-10-CM | POA: Insufficient documentation

## 2015-02-18 DIAGNOSIS — M1990 Unspecified osteoarthritis, unspecified site: Secondary | ICD-10-CM | POA: Diagnosis present

## 2015-02-18 DIAGNOSIS — R7989 Other specified abnormal findings of blood chemistry: Secondary | ICD-10-CM

## 2015-02-18 DIAGNOSIS — I2510 Atherosclerotic heart disease of native coronary artery without angina pectoris: Secondary | ICD-10-CM | POA: Diagnosis present

## 2015-02-18 DIAGNOSIS — J329 Chronic sinusitis, unspecified: Secondary | ICD-10-CM | POA: Diagnosis present

## 2015-02-18 DIAGNOSIS — D631 Anemia in chronic kidney disease: Secondary | ICD-10-CM | POA: Diagnosis present

## 2015-02-18 DIAGNOSIS — M79672 Pain in left foot: Secondary | ICD-10-CM | POA: Diagnosis present

## 2015-02-18 DIAGNOSIS — F17210 Nicotine dependence, cigarettes, uncomplicated: Secondary | ICD-10-CM | POA: Diagnosis present

## 2015-02-18 DIAGNOSIS — I674 Hypertensive encephalopathy: Secondary | ICD-10-CM | POA: Diagnosis present

## 2015-02-18 DIAGNOSIS — R930 Abnormal findings on diagnostic imaging of skull and head, not elsewhere classified: Secondary | ICD-10-CM | POA: Diagnosis present

## 2015-02-18 DIAGNOSIS — M479 Spondylosis, unspecified: Secondary | ICD-10-CM | POA: Diagnosis present

## 2015-02-18 DIAGNOSIS — K219 Gastro-esophageal reflux disease without esophagitis: Secondary | ICD-10-CM | POA: Diagnosis present

## 2015-02-18 DIAGNOSIS — Z794 Long term (current) use of insulin: Secondary | ICD-10-CM

## 2015-02-18 DIAGNOSIS — J811 Chronic pulmonary edema: Secondary | ICD-10-CM

## 2015-02-18 DIAGNOSIS — R4182 Altered mental status, unspecified: Secondary | ICD-10-CM

## 2015-02-18 DIAGNOSIS — Z992 Dependence on renal dialysis: Secondary | ICD-10-CM

## 2015-02-18 DIAGNOSIS — E875 Hyperkalemia: Secondary | ICD-10-CM | POA: Diagnosis present

## 2015-02-18 DIAGNOSIS — Z9115 Patient's noncompliance with renal dialysis: Secondary | ICD-10-CM

## 2015-02-18 DIAGNOSIS — I252 Old myocardial infarction: Secondary | ICD-10-CM

## 2015-02-18 DIAGNOSIS — I132 Hypertensive heart and chronic kidney disease with heart failure and with stage 5 chronic kidney disease, or end stage renal disease: Secondary | ICD-10-CM | POA: Diagnosis present

## 2015-02-18 DIAGNOSIS — R778 Other specified abnormalities of plasma proteins: Secondary | ICD-10-CM | POA: Diagnosis present

## 2015-02-18 DIAGNOSIS — N2581 Secondary hyperparathyroidism of renal origin: Secondary | ICD-10-CM | POA: Diagnosis present

## 2015-02-18 DIAGNOSIS — I219 Acute myocardial infarction, unspecified: Secondary | ICD-10-CM

## 2015-02-18 DIAGNOSIS — M545 Low back pain: Secondary | ICD-10-CM | POA: Diagnosis present

## 2015-02-18 DIAGNOSIS — Z955 Presence of coronary angioplasty implant and graft: Secondary | ICD-10-CM

## 2015-02-18 DIAGNOSIS — Y95 Nosocomial condition: Secondary | ICD-10-CM | POA: Diagnosis present

## 2015-02-18 DIAGNOSIS — Z7982 Long term (current) use of aspirin: Secondary | ICD-10-CM

## 2015-02-18 DIAGNOSIS — Z993 Dependence on wheelchair: Secondary | ICD-10-CM

## 2015-02-18 DIAGNOSIS — R220 Localized swelling, mass and lump, head: Secondary | ICD-10-CM

## 2015-02-18 DIAGNOSIS — I5032 Chronic diastolic (congestive) heart failure: Secondary | ICD-10-CM | POA: Insufficient documentation

## 2015-02-18 LAB — COMPREHENSIVE METABOLIC PANEL
ALBUMIN: 3 g/dL — AB (ref 3.5–5.0)
ALK PHOS: 98 U/L (ref 38–126)
ALT: 14 U/L — AB (ref 17–63)
AST: 43 U/L — AB (ref 15–41)
Anion gap: 16 — ABNORMAL HIGH (ref 5–15)
BUN: 53 mg/dL — AB (ref 6–20)
CALCIUM: 9.5 mg/dL (ref 8.9–10.3)
CO2: 20 mmol/L — AB (ref 22–32)
CREATININE: 7.78 mg/dL — AB (ref 0.61–1.24)
Chloride: 97 mmol/L — ABNORMAL LOW (ref 101–111)
GFR calc Af Amer: 8 mL/min — ABNORMAL LOW (ref >60)
GFR calc non Af Amer: 7 mL/min — ABNORMAL LOW (ref >60)
GLUCOSE: 80 mg/dL (ref 65–99)
Potassium: 5.5 mmol/L — ABNORMAL HIGH (ref 3.5–5.1)
Sodium: 133 mmol/L — ABNORMAL LOW (ref 135–145)
Total Bilirubin: 0.9 mg/dL (ref 0.3–1.2)
Total Protein: 9.2 g/dL — ABNORMAL HIGH (ref 6.5–8.1)

## 2015-02-18 LAB — CBC WITH DIFFERENTIAL/PLATELET
BASOS PCT: 0 %
Basophils Absolute: 0 10*3/uL (ref 0.0–0.1)
EOS PCT: 1 %
Eosinophils Absolute: 0.2 10*3/uL (ref 0.0–0.7)
HEMATOCRIT: 38.8 % — AB (ref 39.0–52.0)
HEMOGLOBIN: 12.6 g/dL — AB (ref 13.0–17.0)
LYMPHS ABS: 1.5 10*3/uL (ref 0.7–4.0)
LYMPHS PCT: 8 %
MCH: 27.6 pg (ref 26.0–34.0)
MCHC: 32.5 g/dL (ref 30.0–36.0)
MCV: 85.1 fL (ref 78.0–100.0)
MONOS PCT: 8 %
Monocytes Absolute: 1.5 10*3/uL — ABNORMAL HIGH (ref 0.1–1.0)
NEUTROS ABS: 15.3 10*3/uL — AB (ref 1.7–7.7)
Neutrophils Relative %: 83 %
Platelets: 261 10*3/uL (ref 150–400)
RBC: 4.56 MIL/uL (ref 4.22–5.81)
RDW: 17.4 % — ABNORMAL HIGH (ref 11.5–15.5)
WBC: 18.5 10*3/uL — ABNORMAL HIGH (ref 4.0–10.5)

## 2015-02-18 LAB — I-STAT TROPONIN, ED: Troponin i, poc: 0.11 ng/mL (ref 0.00–0.08)

## 2015-02-18 LAB — GLUCOSE, CAPILLARY
GLUCOSE-CAPILLARY: 81 mg/dL (ref 65–99)
GLUCOSE-CAPILLARY: 68 mg/dL (ref 65–99)
Glucose-Capillary: 73 mg/dL (ref 65–99)
Glucose-Capillary: 74 mg/dL (ref 65–99)

## 2015-02-18 LAB — I-STAT CG4 LACTIC ACID, ED: Lactic Acid, Venous: 1.28 mmol/L (ref 0.5–2.0)

## 2015-02-18 MED ORDER — VANCOMYCIN HCL IN DEXTROSE 1-5 GM/200ML-% IV SOLN
1000.0000 mg | INTRAVENOUS | Status: DC
  Administered 2015-02-20 – 2015-02-22 (×2): 1000 mg via INTRAVENOUS
  Filled 2015-02-18 (×3): qty 200

## 2015-02-18 MED ORDER — FENTANYL 25 MCG/HR TD PT72
75.0000 ug | MEDICATED_PATCH | TRANSDERMAL | Status: DC
  Administered 2015-02-20: 75 ug via TRANSDERMAL
  Filled 2015-02-18: qty 3

## 2015-02-18 MED ORDER — DEXTROSE 5 % IV SOLN
2.0000 g | Freq: Once | INTRAVENOUS | Status: AC
  Administered 2015-02-18: 2 g via INTRAVENOUS
  Filled 2015-02-18: qty 2

## 2015-02-18 MED ORDER — ONDANSETRON HCL 4 MG PO TABS: 4.0000 mg | ORAL_TABLET | Freq: Four times a day (QID) | ORAL | Status: DC | PRN

## 2015-02-18 MED ORDER — SODIUM CHLORIDE 0.9 % IJ SOLN: 3.0000 mL | INTRAMUSCULAR | Status: DC | PRN

## 2015-02-18 MED ORDER — LORAZEPAM 2 MG/ML IJ SOLN: 0.5000 mg | Freq: Three times a day (TID) | INTRAMUSCULAR | Status: DC | PRN

## 2015-02-18 MED ORDER — NA FERRIC GLUC CPLX IN SUCROSE 12.5 MG/ML IV SOLN
62.5000 mg | INTRAVENOUS | Status: DC
Start: 2015-02-20 — End: 2015-02-22
  Administered 2015-02-20: 62.5 mg via INTRAVENOUS
  Filled 2015-02-18 (×2): qty 5

## 2015-02-18 MED ORDER — NITROGLYCERIN 0.4 MG SL SUBL: 0.4000 mg | SUBLINGUAL_TABLET | SUBLINGUAL | Status: DC | PRN

## 2015-02-18 MED ORDER — ONDANSETRON HCL 4 MG/2ML IJ SOLN: 4.0000 mg | Freq: Four times a day (QID) | INTRAMUSCULAR | Status: DC | PRN

## 2015-02-18 MED ORDER — HYDROMORPHONE HCL 1 MG/ML IJ SOLN
INTRAMUSCULAR | Status: AC
  Administered 2015-02-18: 0.5 mg via INTRAVENOUS
  Filled 2015-02-18: qty 1

## 2015-02-18 MED ORDER — TRIPLE ANTIBIOTIC 3.5-400-5000 EX OINT
1.0000 "application " | TOPICAL_OINTMENT | Freq: Once | CUTANEOUS | Status: DC | PRN
  Filled 2015-02-18: qty 1

## 2015-02-18 MED ORDER — OXYMETAZOLINE HCL 0.05 % NA SOLN
1.0000 | Freq: Once | NASAL | Status: DC | PRN
  Filled 2015-02-18: qty 15

## 2015-02-18 MED ORDER — SODIUM CHLORIDE 0.9 % IV SOLN: 250.0000 mL | INTRAVENOUS | Status: DC | PRN

## 2015-02-18 MED ORDER — SULINDAC 200 MG PO TABS
200.0000 mg | ORAL_TABLET | Freq: Two times a day (BID) | ORAL | Status: DC
  Administered 2015-02-18 – 2015-02-22 (×7): 200 mg via ORAL
  Filled 2015-02-18 (×11): qty 1

## 2015-02-18 MED ORDER — SILVER NITRATE-POT NITRATE 75-25 % EX MISC
10.0000 | Freq: Once | CUTANEOUS | Status: DC | PRN
  Filled 2015-02-18: qty 10

## 2015-02-18 MED ORDER — LIDOCAINE HCL 4 % EX SOLN
0.0000 mL | Freq: Once | CUTANEOUS | Status: DC | PRN
  Filled 2015-02-18: qty 50

## 2015-02-18 MED ORDER — ACETAMINOPHEN 325 MG PO TABS: 650.0000 mg | ORAL_TABLET | Freq: Four times a day (QID) | ORAL | Status: DC | PRN

## 2015-02-18 MED ORDER — LATANOPROST 0.005 % OP SOLN
1.0000 [drp] | Freq: Every day | OPHTHALMIC | Status: DC
  Administered 2015-02-18 – 2015-02-21 (×4): 1 [drp] via OPHTHALMIC
  Filled 2015-02-18: qty 2.5

## 2015-02-18 MED ORDER — LIDOCAINE HCL 2 % EX GEL
1.0000 "application " | Freq: Once | CUTANEOUS | Status: DC | PRN
  Filled 2015-02-18: qty 5

## 2015-02-18 MED ORDER — DORZOLAMIDE HCL-TIMOLOL MAL 2-0.5 % OP SOLN
1.0000 [drp] | Freq: Three times a day (TID) | OPHTHALMIC | Status: DC
  Administered 2015-02-18 – 2015-02-22 (×10): 1 [drp] via OPHTHALMIC
  Filled 2015-02-18 (×2): qty 10

## 2015-02-18 MED ORDER — SODIUM CHLORIDE 0.9 % IJ SOLN
3.0000 mL | Freq: Two times a day (BID) | INTRAMUSCULAR | Status: DC
  Administered 2015-02-18 – 2015-02-21 (×7): 3 mL via INTRAVENOUS

## 2015-02-18 MED ORDER — INSULIN ASPART 100 UNIT/ML ~~LOC~~ SOLN: 0.0000 [IU] | Freq: Three times a day (TID) | SUBCUTANEOUS | Status: DC

## 2015-02-18 MED ORDER — VANCOMYCIN HCL 10 G IV SOLR
2500.0000 mg | Freq: Once | INTRAVENOUS | Status: AC
  Administered 2015-02-18: 2500 mg via INTRAVENOUS
  Filled 2015-02-18 (×2): qty 2500

## 2015-02-18 MED ORDER — ACETAMINOPHEN 650 MG RE SUPP: 650.0000 mg | Freq: Four times a day (QID) | RECTAL | Status: DC | PRN

## 2015-02-18 MED ORDER — DEXTROSE 5 % IV SOLN
2.0000 g | INTRAVENOUS | Status: DC
  Administered 2015-02-18 – 2015-02-22 (×3): 2 g via INTRAVENOUS
  Filled 2015-02-18 (×5): qty 2

## 2015-02-18 MED ORDER — INSULIN ASPART 100 UNIT/ML ~~LOC~~ SOLN: 0.0000 [IU] | Freq: Every day | SUBCUTANEOUS | Status: DC

## 2015-02-18 MED ORDER — LABETALOL HCL 5 MG/ML IV SOLN: 10.0000 mg | INTRAVENOUS | Status: DC | PRN

## 2015-02-18 MED ORDER — HYDROMORPHONE HCL 1 MG/ML IJ SOLN
0.5000 mg | INTRAMUSCULAR | Status: DC | PRN
  Administered 2015-02-18 – 2015-02-21 (×3): 0.5 mg via INTRAVENOUS
  Filled 2015-02-18 (×2): qty 1

## 2015-02-18 MED ORDER — DEXTROSE 5 % IV SOLN
2.0000 g | Freq: Three times a day (TID) | INTRAVENOUS | Status: DC
  Filled 2015-02-18: qty 2

## 2015-02-18 MED ORDER — LIDOCAINE-EPINEPHRINE (PF) 1 %-1:200000 IJ SOLN
0.0000 mL | Freq: Once | INTRAMUSCULAR | Status: DC | PRN
  Filled 2015-02-18: qty 30

## 2015-02-18 MED ORDER — SODIUM CHLORIDE 0.9 % IJ SOLN
3.0000 mL | Freq: Two times a day (BID) | INTRAMUSCULAR | Status: DC
  Administered 2015-02-19 – 2015-02-22 (×3): 3 mL via INTRAVENOUS

## 2015-02-18 NOTE — Progress Notes (Signed)
ANTIBIOTIC CONSULT NOTE - INITIAL  Pharmacy Consult for vancomycin Indication: rule out pneumonia  No Known Allergies  Patient Measurements: Height: 5\' 11"  (180.3 cm) IBW/kg (Calculated) : 75.3 Adjusted Body Weight:   Vital Signs: Temp: 98 F (36.7 C) (10/24 1054) Temp Source: Oral (10/24 1054) BP: 217/114 mmHg (10/24 1054) Pulse Rate: 89 (10/24 1054) Intake/Output from previous day:   Intake/Output from this shift:    Labs:  Recent Labs  02/18/15 1114  WBC 18.5*  HGB 12.6*  PLT 261   CrCl cannot be calculated (Unknown ideal weight.). No results for input(s): VANCOTROUGH, VANCOPEAK, VANCORANDOM, GENTTROUGH, GENTPEAK, GENTRANDOM, TOBRATROUGH, TOBRAPEAK, TOBRARND, AMIKACINPEAK, AMIKACINTROU, AMIKACIN in the last 72 hours.   Microbiology: No results found for this or any previous visit (from the past 720 hour(s)).  Medical History: Past Medical History  Diagnosis Date  . Anxiety   . Back pain   . GERD (gastroesophageal reflux disease)   . Peripheral neuropathy (HCC)   . Arthritis   . Chronic diastolic CHF (congestive heart failure) (HCC)     a. 04/2013 Echo: EF nl, no rwma, mild LVH.  Marland Kitchen Active smoker   . ESRD on hemodialysis (HCC)     [name] 4 days per week on M-Tu-Wed and Fri.  Started HD in 1998 and has been on HD initially at [facility], then went to [facility], then in [facility], then to [facility] and now is at [facility] for last 10 years.  Has L thigh AVG.     . Diabetes mellitus     diet controlled  . 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 PONV (postoperative nausea and vomiting)   . Hypertension   . Bell palsy   . Carpal tunnel syndrome     bilateral  . Palpitations   . Headache(784.0)     Hx: of Migraines  . CAD (coronary artery disease)     a. 04/2013 Ant STEMI/PCI: LM nl, LAD 95p (3.0x18 Xience DES), D1/2/3 small, LCX 80ost, RI 20p, RCA 161m CTO (unsuccessful PCI), EF 55%, mod basal inf HK.  . S/P coronary artery stent placement  05/21/2013    Xience Alpine Medtronic conditional 5 @ 1.5 and 3T     Medications:  Anti-infectives    Start     Dose/Rate Route Frequency Ordered Stop   02/18/15 1315  vancomycin (VANCOCIN) 2,500 mg in sodium chloride 0.9 % 500 mL IVPB     2,500 mg 250 mL/hr over 120 Minutes Intravenous  Once 02/18/15 1302     02/18/15 1300  cefTAZidime (FORTAZ) 2 g in dextrose 5 % 50 mL IVPB     2 g 100 mL/hr over 30 Minutes Intravenous  Once 02/18/15 1251       Assessment: 59 yom presented to the ED from HD with AMS. To start empiric vancomycin + ceftaz for possible pneumonia. Pt is afebrile and WBC is elevated at 18.5.   Vanc 10/24>> Vanc 10/24>>  Goal of Therapy:  Vancomycin pre-HD level 15-25 mcg/ml  Plan:  - Ceftazidime 2gm IV x 1 now then with each HD - Vancomycin 2500mg  IV x 1 then 1gm post-HD - F/u renal plans, C&S, clinical status and pre-HD level when appropriate - F/u HD plans and will enter maintenance doses once schedule determined  Christinamarie Tall, Drake Leach 02/18/2015,1:04 PM

## 2015-02-18 NOTE — Consult Note (Signed)
[facility] Renal Consultation Note  Indication for Consultation:  Management of ESRD/hemodialysis; anemia, hypertension/volume and secondary hyperparathyroidism  HPI: [name] is a 59 y.o. male sent from [facility] by Dr. Justin Mend after 34 min of dialysis secondary to altered MS more lethargy than normal( wheel chair bound /he is hoyer lift weighed at kidney center). He lives at a rehab/NH facility sec to decreased mobility with severe DJD/ spinal stenosis. Noted in ER here to have WBC 18.5 hgb 12.6 CXR "R base PNA but more likely Pul edema." O2 sat  93%/ Co2 20 k 5.5 lactic acid  Nl. CT of head -  atrophy, no hemorrhage/mucocele vs neoplasm L maxillary antrum.  LS spine xray -  extensive arthropathy L 5-S1.   In room he is not able to give me much of a history thinks today is Friday , not aware where he is and no cos with soft mumbling speech. When I saw him he could not stay awake for interview, was not oriented to person, place or time - did try to help turn himself for examination of his backside.  Had a loose sounding cough when asked to deep breathe.    According to the staff at the dialysis unit he looked weaker than usual and complained of severe back pain (but often signs off because of this - has not had a full treatment since 12/14/14 due to sign offs), became confused, started falling asleep during conversation and started coughing up brown sputum. Dr. Justin Mend instructed transfer of patient to [facility] by EMS.  He was afebrile pre and post HD.    He has a left thigh AVG with marked edema in his left leg that has been present since at leat 10/2014 - had a venogram 01/01/15 that did not show significant venous outflow stenosis.   Re his mental status - has been on oxycodone (10 mg pre HD, 5 mg TID prn pain, baclofen 5 mg bid prn, ativan 0.5 Q8H prn). Also listed as receiving a fentanyl patch (I did not see one anywhere) and gabapentin (renally adjusted).    Past Medical  History  Diagnosis Date  . Anxiety   . Back pain   . GERD (gastroesophageal reflux disease)   . Peripheral neuropathy ([place])   . Arthritis   . Chronic diastolic CHF (congestive heart failure) ([place])     a. 04/2013 Echo: EF nl, no rwma, mild LVH.  Marland Kitchen Active smoker   . ESRD on hemodialysis ([place])     [name] 4 days per week on M-Tu-Wed and Fri.  Started HD in 1998 and has been on HD initially at [facility], then went to [facility], then in [facility], then to [facility] and now is at [facility] for last 10 years.  Has L thigh AVG.     . Diabetes mellitus     diet controlled  . 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 PONV (postoperative nausea and vomiting)   . Hypertension   . Bell palsy   . Carpal tunnel syndrome     bilateral  . Palpitations   . Headache(784.0)     Hx: of Migraines  . CAD (coronary artery disease)     a. 04/2013 Ant STEMI/PCI: LM nl, LAD 95p (3.0x18 Xience DES), D1/2/3 small, LCX 80ost, RI 20p, RCA 131m CTO (unsuccessful PCI), EF 55%, mod basal inf HK.  . S/P coronary artery stent placement  05/21/2013    Xience Alpine Medtronic conditional 5 @ 1.5 and 3T     Past Surgical History  Procedure Laterality Date  . Total hip arthroplasty    . Thyroidectomy    . Tooth extraction    . Mandible fracture surgery    . Dg av dialysis graft declot or    . Insertion of dialysis catheter  03/28/2011    Procedure: INSERTION OF DIALYSIS CATHETER;  Surgeon: Pryor Ochoa, MD;  Location: [facility] OR;  Service: Vascular;  Laterality: Left;  . Av fistula placement  03/31/2011    Procedure: INSERTION OF ARTERIOVENOUS (AV) GORE-TEX GRAFT THIGH;  Surgeon: Sherren Kerns, MD;  Location: [facility] OR;  Service: Vascular;  Laterality: Right;  redo right thigh arteriovenous gortex graft using gore-tex stretch 36mm x 70cm  . Thrombectomy w/ embolectomy  06/02/2011    Procedure: THROMBECTOMY ARTERIOVENOUS GORE-TEX GRAFT;  Surgeon: Chuck Hint, MD;  Location: [facility] OR;  Service: Vascular;   Laterality: Right;  Thrombectomy right thigh arteriovenous gortex graft;  revision by  replacement of large portion of graft with 53mm gore-tex   . Insertion of dialysis catheter  08/28/2011    Procedure: INSERTION OF DIALYSIS CATHETER;  Surgeon: Fransisco Hertz, MD;  Location: [facility] OR;  Service: Vascular;  Laterality: Left;  Atempted Bilateral Internal Jugular, Bilateral Subclavin insertion of 55cm Dialysis Catheter Left Femoral  . Insertion of dialysis catheter  12/15/2011    Procedure: INSERTION OF DIALYSIS CATHETER;  Surgeon: Sherren Kerns, MD;  Location: [facility] OR;  Service: Vascular;  Laterality: Right;  Insertion of Right Femoral Dialysis Catheter  . Exchange of a dialysis catheter  02/26/2012    Procedure: EXCHANGE OF A DIALYSIS CATHETER;  Surgeon: Larina Earthly, MD;  Location: [facility] OR;  Service: Vascular;  Laterality: Right;  . Joint replacement    . Exchange of a dialysis catheter  05/18/2012    Procedure: EXCHANGE OF A DIALYSIS CATHETER;  Surgeon: Larina Earthly, MD;  Location: [facility] OR;  Service: Vascular;  Laterality: Right;  right femoral dialysis catheter  . Av fistula placement  05/18/2012    Procedure: INSERTION OF ARTERIOVENOUS (AV) GORE-TEX GRAFT THIGH;  Surgeon: Larina Earthly, MD;  Location: [facility] OR;  Service: Vascular;  Laterality: Left;  using 44mm by 70cm goretex graft  . Thrombectomy w/ embolectomy Left 08/25/2012    Procedure: THROMBECTOMY ARTERIOVENOUS GORE-TEX GRAFT;  Surgeon: Pryor Ochoa, MD;  Location: [facility] OR;  Service: Vascular;  Laterality: Left;  Attempted thrombectomy of left thigh arteriovenous gortex graft.   . Av fistula placement Left 08/25/2012    Procedure: INSERTION OF ARTERIOVENOUS (AV) GORE-TEX GRAFT THIGH;  Surgeon: Pryor Ochoa, MD;  Location: [facility] OR;  Service: Vascular;  Laterality: Left;  Using 11mm x 40cm vascular Gortex graft.  . Revision of arteriovenous goretex graft Left 12/14/2012    Procedure: Revision of Left Thigh Graft;  Surgeon: Larina Earthly, MD;  Location: [facility] OR;   Service: Vascular;  Laterality: Left;  . Avgg removal Left 02/16/2013    Procedure: EXCISION OF LEFT ARM ARTERIOVENOUS GORETEX GRAFT TIMES 2 WITH VEIN PATCH ANGIOPLASTY OF BRACIAL ARTERY.  ;  Surgeon: Chuck Hint, MD;  Location: [facility] OR;  Service: Vascular;  Laterality: Left;  Converted from MAC to General.    . Revision of arteriovenous goretex graft Left 08/08/2013    Procedure: REVISION OF LEFT THIGH ARTERIOVENOUS GORETEX GRAFT;  Surgeon: Sherren Kerns, MD;  Location: [facility] OR;  Service: Vascular;  Laterality: Left;  . Venogram Bilateral 10/27/2011    Procedure: VENOGRAM;  Surgeon: Serafina Mitchell, MD;  Location: [facility] CATH LAB;  Service: Cardiovascular;  Laterality: Bilateral;  bilat upper extrem venograms  . Left heart cath Bilateral 05/21/2013    Procedure: LEFT HEART CATH;  Surgeon: Wellington Hampshire, MD;  Location: [facility] CATH LAB;  Service: Cardiovascular;  Laterality: Bilateral;  . Percutaneous coronary stent intervention (pci-s)  05/21/2013    Procedure: PERCUTANEOUS CORONARY STENT INTERVENTION (PCI-S);  Surgeon: Wellington Hampshire, MD;  Location: [facility] CATH LAB;  Service: Cardiovascular;;  . Coronary angioplasty with stent placement    . Radiology with anesthesia N/A 06/27/2014    Procedure: MRI LUMBER WITHOUT CONTRAST;  Surgeon: Medication Radiologist, MD;  Location: [place];  Service: Radiology;  Laterality: N/A;  . Revision of arteriovenous goretex graft Left 08/13/2014    Procedure: REVISION OF ARTERIOVENOUS GORETEX GRAFT LEFT THIGH;  Surgeon: Elam Dutch, MD;  Location: [place];  Service: Vascular;  Laterality: Left;  . Radiology with anesthesia N/A 08/16/2014    Procedure: MRI;  Surgeon: Medication Radiologist, MD;  Location: [place];  Service: Radiology;  Laterality: N/A;  . Thrombectomy w/ embolectomy Left 08/22/2014    Procedure: THROMBECTOMY ARTERIOVENOUS GORE-TEX GRAFT Of Left Thigh Graft;  Surgeon: Rosetta Posner, MD;  Location: [place];  Service: Vascular;  Laterality: Left;  . Peripheral  vascular catheterization N/A 10/09/2014    Procedure: Shuntogram;  Surgeon: Serafina Mitchell, MD;  Location: [place] CV LAB;  Service: Cardiovascular;  Laterality: N/A;  . Av fistula placement Left 10/30/2014    Procedure: INSERTION OF LEFT THIGH GRAFT;  Surgeon: Serafina Mitchell, MD;  Location: [place];  Service: Vascular;  Laterality: Left;  . Removal of graft Left 10/30/2014    Procedure: REMOVAL OF LEFT THIGH GRAFT;  Surgeon: Serafina Mitchell, MD;  Location: [place];  Service: Vascular;  Laterality: Left;  . Peripheral vascular catheterization Left 01/01/2015    Procedure:  Shuntogram;  Surgeon: Serafina Mitchell, MD;  Location: [place] CV LAB;  Service: Cardiovascular;  Laterality: Left;      Family History  Problem Relation Age of Onset  . Diabetes Mother   . Stroke Mother   . Heart disease Mother   . Kidney disease Father   . Hyperlipidemia Father       reports that he has been smoking Cigarettes.  He has a 8 pack-year smoking history. He has never used smokeless tobacco. He reports that he does not drink alcohol or use illicit drugs.  No Known Allergies  Prior to Admission medications   Medication Sig Start Date End Date Taking? Authorizing Provider  Amino Acids-Protein Hydrolys (FEEDING SUPPLEMENT, PRO-STAT SUGAR FREE 64,) LIQD Take 30 mLs by mouth 2 (two) times daily.   Yes Historical Provider, MD  aspirin 81 MG tablet Take 1 tablet (81 mg total) by mouth daily. 05/24/13  Yes Rhonda G Barrett, PA-C  atorvastatin (LIPITOR) 80 MG tablet Take 1 tablet (80 mg total) by mouth daily at 6 PM. 05/24/13  Yes Rhonda G Barrett, PA-C  b complex vitamins tablet Take 1 tablet by mouth daily.   Yes Historical Provider, MD  B Complex-C (B-COMPLEX WITH VITAMIN C) tablet Take 1 tablet by mouth at bedtime.   Yes Historical Provider, MD  baclofen (LIORESAL) 10 MG tablet Take 5 mg by mouth 2 (two) times daily.   Yes Historical Provider, MD  calcium carbonate (TUMS - DOSED IN MG ELEMENTAL CALCIUM) 500  MG  chewable tablet Chew 3 tablets by mouth 3 (three) times daily.   Yes Historical Provider, MD  clopidogrel (PLAVIX) 75 MG tablet Take 1 tablet (75 mg total) by mouth daily. 10/10/14  Yes Ashly M Gottschalk, DO  colchicine 0.6 MG tablet Take 0.6 mg by mouth every 12 (twelve) hours as needed (gout).    Yes Historical Provider, MD  dorzolamide-timolol (COSOPT) 22.3-6.8 MG/ML ophthalmic solution Place 1 drop into both eyes 3 (three) times daily.   Yes Historical Provider, MD  escitalopram (LEXAPRO) 10 MG tablet Take 10 mg by mouth daily.   Yes Historical Provider, MD  famotidine (PEPCID) 20 MG tablet Take 20 mg by mouth 2 (two) times daily.   Yes Historical Provider, MD  fentaNYL (DURAGESIC - DOSED MCG/HR) 50 MCG/HR Apply one patch topically every 72 hours for pain. Remove old patch 10/08/14  Yes Tiffany L Reed, DO  gabapentin (NEURONTIN) 100 MG capsule Take 100 mg by mouth every 12 (twelve) hours as needed (neuropathy).  12/29/13  Yes Historical Provider, MD  latanoprost (XALATAN) 0.005 % ophthalmic solution Place 1 drop into both eyes at bedtime.  04/20/14  Yes Historical Provider, MD  loperamide (IMODIUM A-D) 2 MG tablet Take 4 mg by mouth every 4 (four) hours as needed for diarrhea or loose stools.   Yes Historical Provider, MD  LORazepam (ATIVAN) 0.5 MG tablet Take 1 tablet (0.5 mg total) by mouth every 8 (eight) hours as needed for anxiety. 08/21/14  Yes Thurnell Lose, MD  Menthol, Topical Analgesic, 4 % GEL Apply 1 application topically every 6 (six) hours as needed (pain). Apply to shoulders/lower back   Yes Historical Provider, MD  nitroGLYCERIN (NITROSTAT) 0.4 MG SL tablet Place 1 tablet (0.4 mg total) under the tongue every 5 (five) minutes as needed for chest pain. 05/24/13  Yes Rhonda G Barrett, PA-C  oxyCODONE (OXY IR/ROXICODONE) 5 MG immediate release tablet Take 10 mg by mouth See admin instructions. 1 hour before dialysis on MWF   Yes Historical Provider, MD  sodium bicarbonate 650 MG tablet  Take 650 mg by mouth 2 (two) times daily.     Yes Historical Provider, MD  sulindac (CLINORIL) 200 MG tablet Take 200 mg by mouth 2 (two) times daily.   Yes Historical Provider, MD  ROS: as in HPI , pt not able to give any history/ no reported fevers at op kid center, sob or cp today before sent from the dialysis unit  Physical Exam: BP 151/140 mmHg  Pulse 84  Temp(Src) 98.5 F (36.9 C) (Oral)  Resp 22  Ht $R'5\' 11"'di$  (1.803 m)  SpO2 96%  General: NAd / lethargic AAM , lethargic, mumbling speech / appears chronically ill  HEENT: [place]/ R eye crusting matter /Dry MM Neck: mild jvd Heart: RRR soft 1/6 sem  lsb , no rub or gallop Lungs: scattered rhonchi/ crackles ,nonlabored breathing Loose productive sounding cough with deep breathing  Abdomen:  Obese, bs pos soft NT, ND Extremities: 1 + bipedal edema Skin: dry scaling skin feet no open ulcers  Neuro: disoriented to time and place ,states my name,R sided upper and low extrem  Weakness, follows commands  Dialysis Access: Pos bruit L fem avgg with mild swelling Moderate edema of LLE esp upper thigh. Woody pitting.  No sores or pustules on AVG.   No sacral decubiti or skin breakdown   Continuous:   Results for orders placed or performed during the hospital encounter of 02/18/15 (from the past 48 hour(s))  CBC with Differential     Status: Abnormal   Collection Time: 02/18/15 11:14 AM  Result Value Ref Range   WBC 18.5 (H) 4.0 - 10.5 K/uL    Comment: REPEATED TO VERIFY   RBC 4.56 4.22 - 5.81 MIL/uL   Hemoglobin 12.6 (L) 13.0 - 17.0 g/dL   HCT 38.8 (L) 39.0 - 52.0 %   MCV 85.1 78.0 - 100.0 fL   MCH 27.6 26.0 - 34.0 pg   MCHC 32.5 30.0 - 36.0 g/dL   RDW 17.4 (H) 11.5 - 15.5 %   Platelets 261 150 - 400 K/uL   Neutrophils Relative % 83 %   Lymphocytes Relative 8 %   Monocytes Relative 8 %   Eosinophils Relative 1 %   Basophils Relative 0 %   Neutro Abs 15.3 (H) 1.7 - 7.7 K/uL   Lymphs Abs 1.5 0.7 - 4.0 K/uL   Monocytes Absolute 1.5 (H)  0.1 - 1.0 K/uL   Eosinophils Absolute 0.2 0.0 - 0.7 K/uL   Basophils Absolute 0.0 0.0 - 0.1 K/uL   RBC Morphology POLYCHROMASIA PRESENT     Comment: BURR CELLS ELLIPTOCYTES   Comprehensive metabolic panel     Status: Abnormal   Collection Time: 02/18/15 11:14 AM  Result Value Ref Range   Sodium 133 (L) 135 - 145 mmol/L   Potassium 5.5 (H) 3.5 - 5.1 mmol/L   Chloride 97 (L) 101 - 111 mmol/L   CO2 20 (L) 22 - 32 mmol/L   Glucose, Bld 80 65 - 99 mg/dL   BUN 53 (H) 6 - 20 mg/dL   Creatinine, Ser 7.78 (H) 0.61 - 1.24 mg/dL   Calcium 9.5 8.9 - 10.3 mg/dL   Total Protein 9.2 (H) 6.5 - 8.1 g/dL   Albumin 3.0 (L) 3.5 - 5.0 g/dL   AST 43 (H) 15 - 41 U/L   ALT 14 (L) 17 - 63 U/L   Alkaline Phosphatase 98 38 - 126 U/L   Total Bilirubin 0.9 0.3 - 1.2 mg/dL   GFR calc non Af Amer 7 (L) >60 mL/min   GFR calc Af Amer 8 (L) >60 mL/min    Comment: (NOTE) The eGFR has been calculated using the CKD EPI equation. This calculation has not been validated in all clinical situations. eGFR's persistently <60 mL/min signify possible Chronic Kidney Disease.    Anion gap 16 (H) 5 - 15  I-Stat CG4 Lactic Acid, ED     Status: None   Collection Time: 02/18/15 11:25 AM  Result Value Ref Range   Lactic Acid, Venous 1.28 0.5 - 2.0 mmol/L  I-stat troponin, ED     Status: Abnormal   Collection Time: 02/18/15 12:16 PM  Result Value Ref Range   Troponin i, poc 0.11 (HH) 0.00 - 0.08 ng/mL   Comment NOTIFIED PHYSICIAN    Comment 3            Comment: Due to the release kinetics of cTnI, a negative result within the first hours of the onset of symptoms does not rule out myocardial infarction with certainty. If myocardial infarction is still suspected, repeat the test at appropriate intervals.    Dialysis Orders: Center: [facility]   on MWF . EDW 111kg HD Bath 2k/3.5 Ca uf profile 4  Time 4.5 hr Heparin 0. Access L Leg AVG    Zemplar 0 mcg IV/HD / Mircera 75 mcg q 2 wks  Last on 02/13/15    Units IV/HD  Venofer   '100mg'$   Other op  labs = hgb 11.4 ca 10.3 phos 4.2  pth <7   Assessment/Plan 1. AMS multiple factors ? Uremia missed hd (has not had a complete treatment since August 2016)/ infection (WBC 18,500, ? Of PNA on CXR; nothing on AVG to strongly implicate infection of the AVG)/multiple meds for pain/agitation including Baclofen/oxycodone/ativan/gabapention/fentanyl. Need to panculture, stop as many CNS active medications as possible, empiric ATB's post cultures, remove volume with HD and repeat CXR (to help sort out PNA vs fluid). Consider ABG to r/o hypercarbia. 2. Vol. Overload - HD today attempt uf as bp allows 3. ?Hcap- antibiotics per primary service. BC's drawn  4. ESRD -  HD MWF  HD today  5. Hypertension/volume  - uncontrolled. Evaluate effect of reducing volume.  6. Anemia of ESRD- hgb >12 no esa  F/u hgb  , weekly iron (continue) 7. Metabolic bone disease -  No vit d pth <7 , no binders 8. Chronicback pain- paiin meds / At NH/rehab center  Ernest Haber, PA-C [place] [phone] 02/18/2015, 2:35 PM   I have seen and examined this patient and agree with plan and assessment in the above note with highlighted additions. AMS - infection vs drugs vs hypercarbia vs clinical uremia (no full HD since July 2016)..  Cultures done, ATB's to be initiated. Dialysis today - try to keep pt on long enough to reduce volume. F/U CXR after volume removal.   Ulric Salzman B,MD 02/18/2015 5:05 PM

## 2015-02-18 NOTE — ED Notes (Signed)
IV team at bedside 

## 2015-02-18 NOTE — ED Notes (Addendum)
Pt brought from dialysis for tailbone pain and bilateral leg pain.  Pt did not complete his tx today.  Per facility pt appears to be altered.  Pt is bed bound and is from [facility].  Pt dialysis port to left groin is still accessed.  IV team to be consulted.

## 2015-02-18 NOTE — ED Provider Notes (Signed)
CSN: [id number]     Arrival date & time    History   First MD Initiated Contact with Patient 02/18/15 1037     Chief Complaint  Patient presents with  . Altered Mental Status    HPI  [name] is a 59 y.o. male with a PMH of CHF, ESRD on HD, DM, hypertension who presents to the ED with tailbone pain and bilateral foot pain. Per report, he did not complete his dialysis today, and was noted to be altered by his facility. Patient states he was dropped during rehab and landed on his tailbone. He reports he has had tailbone pain since that time. He states his symptoms are constant. He denies exacerbating or alleviating factors. He also reports bilateral foot pain. He denies numbness, weakness, paresthesia. He denies hitting his head or LOC. Patient reports chest pain, though is a poor historian and is unable to characterize his pain.   Past Medical History  Diagnosis Date  . Anxiety   . Back pain   . GERD (gastroesophageal reflux disease)   . Peripheral neuropathy (HCC)   . Arthritis   . Chronic diastolic CHF (congestive heart failure) (HCC)     a. 04/2013 Echo: EF nl, no rwma, mild LVH.  Marland Kitchen Active smoker   . ESRD on hemodialysis (HCC)     [name] 4 days per week on M-Tu-Wed and Fri.  Started HD in 1998 and has been on HD initially at [facility], then went to [facility], then in [facility], then to [facility] and now is at [facility] for last 10 years.  Has L thigh AVG.     . Diabetes mellitus     diet controlled  . 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 PONV (postoperative nausea and vomiting)   . Hypertension   . Bell palsy   . Carpal tunnel syndrome     bilateral  . Palpitations   . Headache(784.0)     Hx: of Migraines  . CAD (coronary artery disease)     a. 04/2013 Ant STEMI/PCI: LM nl, LAD 95p (3.0x18 Xience DES), D1/2/3 small, LCX 80ost, RI 20p, RCA 113m CTO (unsuccessful PCI), EF 55%, mod basal inf HK.  . S/P coronary artery stent placement 05/21/2013     Xience [employer] Medtr[employer] conditional 5 @ 1.5 and 3T    Past Surgical History  Procedure Laterality Date  . Total hip arthroplasty    . Thyroidectomy    . Tooth extraction    . Mandible fracture surgery    . Dg av dialysis graft declot or    . Insertion of dialysis catheter  03/28/2011    Procedure: INSERTION OF DIALYSIS CATHETER;  Surgeon: Pryor Ochoa, MD;  Location: [facility] OR;  Service: Vascular;  Laterality: Left;  . Av fistula placement  03/31/2011    Procedure: INSERTION OF ARTERIOVENOUS (AV) GORE-TEX GRAFT THIGH;  Surgeon: Sherren Kerns, MD;  Location: [facility] OR;  Service: Vascular;  Laterality: Right;  redo right thigh arteriovenous gortex graft using gore-tex stretch 6mm x 70cm  . Thrombectomy w/ embolectomy  06/02/2011    Procedure: THROMBECTOMY ARTERIOVENOUS GORE-TEX GRAFT;  Surgeon: Chuck Hint, MD;  Location: [facility] OR;  Service: Vascular;  Laterality: Right;  Thrombectomy right thigh arteriovenous gortex graft;  revision by  replacement of large portion of graft with 7mm gore-tex   . Insertion of dialysis catheter  08/28/2011    Procedure:  INSERTION OF DIALYSIS CATHETER;  Surgeon: Fransisco Hertz, MD;  Location: [name] OR;  Service: Vascular;  Laterality: Left;  Atempted Bilateral Internal Jugular, Bilateral Subclavin insertion of 55cm Dialysis Catheter Left Femoral  . Insertion of dialysis catheter  12/15/2011    Procedure: INSERTION OF DIALYSIS CATHETER;  Surgeon: Sherren Kerns, MD;  Location: [facility] OR;  Service: Vascular;  Laterality: Right;  Insertion of Right Femoral Dialysis Catheter  . Exchange of a dialysis catheter  02/26/2012    Procedure: EXCHANGE OF A DIALYSIS CATHETER;  Surgeon: Larina Earthly, MD;  Location: [facility] OR;  Service: Vascular;  Laterality: Right;  . Joint replacement    . Exchange of a dialysis catheter  05/18/2012    Procedure: EXCHANGE OF A DIALYSIS CATHETER;  Surgeon: Larina Earthly, MD;  Location: [facility] OR;  Service: Vascular;  Laterality: Right;  right femoral dialysis  catheter  . Av fistula placement  05/18/2012    Procedure: INSERTION OF ARTERIOVENOUS (AV) GORE-TEX GRAFT THIGH;  Surgeon: Larina Earthly, MD;  Location: [facility] OR;  Service: Vascular;  Laterality: Left;  using 6mm by 70cm goretex graft  . Thrombectomy w/ embolectomy Left 08/25/2012    Procedure: THROMBECTOMY ARTERIOVENOUS GORE-TEX GRAFT;  Surgeon: Pryor Ochoa, MD;  Location: [facility] OR;  Service: Vascular;  Laterality: Left;  Attempted thrombectomy of left thigh arteriovenous gortex graft.   . Av fistula placement Left 08/25/2012    Procedure: INSERTION OF ARTERIOVENOUS (AV) GORE-TEX GRAFT THIGH;  Surgeon: Pryor Ochoa, MD;  Location: [facility] OR;  Service: Vascular;  Laterality: Left;  Using 6mm x 40cm vascular Gortex graft.  . Revision of arteriovenous goretex graft Left 12/14/2012    Procedure: Revision of Left Thigh Graft;  Surgeon: Larina Earthly, MD;  Location: [facility] OR;  Service: Vascular;  Laterality: Left;  . Avgg removal Left 02/16/2013    Procedure: EXCISION OF LEFT ARM ARTERIOVENOUS GORETEX GRAFT TIMES 2 WITH VEIN PATCH ANGIOPLASTY OF BRACIAL ARTERY.  ;  Surgeon: Chuck Hint, MD;  Location: [facility] OR;  Service: Vascular;  Laterality: Left;  Converted from MAC to General.    . Revision of arteriovenous goretex graft Left 08/08/2013    Procedure: REVISION OF LEFT THIGH ARTERIOVENOUS GORETEX GRAFT;  Surgeon: Sherren Kerns, MD;  Location: [facility] OR;  Service: Vascular;  Laterality: Left;  . Venogram Bilateral 10/27/2011    Procedure: VENOGRAM;  Surgeon: Nada Libman, MD;  Location: [facility] CATH LAB;  Service: Cardiovascular;  Laterality: Bilateral;  bilat upper extrem venograms  . Left heart cath Bilateral 05/21/2013    Procedure: LEFT HEART CATH;  Surgeon: Iran Ouch, MD;  Location: [facility] CATH LAB;  Service: Cardiovascular;  Laterality: Bilateral;  . Percutaneous coronary stent intervention (pci-s)  05/21/2013    Procedure: PERCUTANEOUS CORONARY STENT INTERVENTION (PCI-S);  Surgeon: Iran Ouch, MD;   Location: [facility] CATH LAB;  Service: Cardiovascular;;  . Coronary angioplasty with stent placement    . Radiology with anesthesia N/A 06/27/2014    Procedure: MRI LUMBER WITHOUT CONTRAST;  Surgeon: Medication Radiologist, MD;  Location: [facility] OR;  Service: Radiology;  Laterality: N/A;  . Revision of arteriovenous goretex graft Left 08/13/2014    Procedure: REVISION OF ARTERIOVENOUS GORETEX GRAFT LEFT THIGH;  Surgeon: Sherren Kerns, MD;  Location: [facility] OR;  Service: Vascular;  Laterality: Left;  . Radiology with anesthesia N/A 08/16/2014    Procedure: MRI;  Surgeon: Medication Radiologist, MD;  Location: [facility] OR;  Service: Radiology;  Laterality: N/A;  . Thrombectomy  w/ embolectomy Left 08/22/2014    Procedure: THROMBECTOMY ARTERIOVENOUS GORE-TEX GRAFT Of Left Thigh Graft;  Surgeon: Larina Earthly, MD;  Location: [facility] OR;  Service: Vascular;  Laterality: Left;  . Peripheral vascular catheterization N/A 10/09/2014    Procedure: Shuntogram;  Surgeon: Nada Libman, MD;  Location: [facility] INVASIVE CV LAB;  Service: Cardiovascular;  Laterality: N/A;  . Av fistula placement Left 10/30/2014    Procedure: INSERTION OF LEFT THIGH GRAFT;  Surgeon: Nada Libman, MD;  Location: [facility] OR;  Service: Vascular;  Laterality: Left;  . Removal of graft Left 10/30/2014    Procedure: REMOVAL OF LEFT THIGH GRAFT;  Surgeon: Nada Libman, MD;  Location: [facility] OR;  Service: Vascular;  Laterality: Left;  . Peripheral vascular catheterization Left 01/01/2015    Procedure:  Shuntogram;  Surgeon: Nada Libman, MD;  Location: [facility] INVASIVE CV LAB;  Service: Cardiovascular;  Laterality: Left;   Family History  Problem Relation Age of Onset  . Diabetes Mother   . Stroke Mother   . Heart disease Mother   . Kidney disease Father   . Hyperlipidemia Father    Social History  Substance Use Topics  . Smoking status: Current Every Day Smoker -- 0.20 packs/day for 40 years    Types: Cigarettes  . Smokeless tobacco: Never Used  . Alcohol Use: No       Review of Systems  Respiratory: Negative for shortness of breath.   Cardiovascular: Positive for chest pain.  Gastrointestinal: Negative for nausea, vomiting, abdominal pain, diarrhea and constipation.  Musculoskeletal: Positive for myalgias and arthralgias.  Neurological: Positive for dizziness and light-headedness. Negative for syncope, weakness, numbness and headaches.  Psychiatric/Behavioral: Negative for confusion.  All other systems reviewed and are negative.     Allergies  Review of patient's allergies indicates no known allergies.  Home Medications   Prior to Admission medications   Medication Sig Start Date End Date Taking? Authorizing Provider  Amino Acids-Protein Hydrolys (FEEDING SUPPLEMENT, PRO-STAT SUGAR FREE 64,) LIQD Take 30 mLs by mouth 2 (two) times daily.   Yes Historical Provider, MD  aspirin 81 MG tablet Take 1 tablet (81 mg total) by mouth daily. 05/24/13  Yes Rhonda G Barrett, PA-C  atorvastatin (LIPITOR) 80 MG tablet Take 1 tablet (80 mg total) by mouth daily at 6 PM. 05/24/13  Yes Rhonda G Barrett, PA-C  b complex vitamins tablet Take 1 tablet by mouth daily.   Yes Historical Provider, MD  B Complex-C (B-COMPLEX WITH VITAMIN C) tablet Take 1 tablet by mouth at bedtime.   Yes Historical Provider, MD  baclofen (LIORESAL) 10 MG tablet Take 5 mg by mouth 2 (two) times daily.   Yes Historical Provider, MD  calcium carbonate (TUMS - DOSED IN MG ELEMENTAL CALCIUM) 500 MG chewable tablet Chew 3 tablets by mouth 3 (three) times daily.   Yes Historical Provider, MD  clopidogrel (PLAVIX) 75 MG tablet Take 1 tablet (75 mg total) by mouth daily. 10/10/14  Yes Ashly M Gottschalk, DO  colchicine 0.6 MG tablet Take 0.6 mg by mouth every 12 (twelve) hours as needed (gout).    Yes Historical Provider, MD  dorzolamide-timolol (COSOPT) 22.3-6.8 MG/ML ophthalmic solution Place 1 drop into both eyes 3 (three) times daily.   Yes Historical Provider, MD  escitalopram (LEXAPRO) 10 MG  tablet Take 10 mg by mouth daily.   Yes Historical Provider, MD  famotidine (PEPCID) 20 MG tablet Take 20 mg by mouth 2 (two) times daily.   Yes  Historical Provider, MD  fentaNYL (DURAGESIC - DOSED MCG/HR) 50 MCG/HR Apply one patch topically every 72 hours for pain. Remove old patch 10/08/14  Yes Tiffany L Reed, DO  gabapentin (NEURONTIN) 100 MG capsule Take 100 mg by mouth every 12 (twelve) hours as needed (neuropathy).  12/29/13  Yes Historical Provider, MD  latanoprost (XALATAN) 0.005 % ophthalmic solution Place 1 drop into both eyes at bedtime.  04/20/14  Yes Historical Provider, MD  loperamide (IMODIUM A-D) 2 MG tablet Take 4 mg by mouth every 4 (four) hours as needed for diarrhea or loose stools.   Yes Historical Provider, MD  LORazepam (ATIVAN) 0.5 MG tablet Take 1 tablet (0.5 mg total) by mouth every 8 (eight) hours as needed for anxiety. 08/21/14  Yes Leroy Sea, MD  Menthol, Topical Analgesic, 4 % GEL Apply 1 application topically every 6 (six) hours as needed (pain). Apply to shoulders/lower back   Yes Historical Provider, MD  nitroGLYCERIN (NITROSTAT) 0.4 MG SL tablet Place 1 tablet (0.4 mg total) under the tongue every 5 (five) minutes as needed for chest pain. 05/24/13  Yes Rhonda G Barrett, PA-C  oxyCODONE (OXY IR/ROXICODONE) 5 MG immediate release tablet Take 1-2 tablets (5-10 mg total) by mouth every 4 (four) hours as needed for severe pain. Take 5 or 10 mg every 4 hours as needed for pain AND take 10 mg prior to dialysis Patient taking differently: Take 5-10 mg by mouth every 4 (four) hours as needed for severe pain.  10/30/14  Yes Lars Mage, PA-C  oxyCODONE (OXY IR/ROXICODONE) 5 MG immediate release tablet Take 10 mg by mouth See admin instructions. 1 hour before dialysis on MWF   Yes Historical Provider, MD  sodium bicarbonate 650 MG tablet Take 650 mg by mouth 2 (two) times daily.     Yes Historical Provider, MD  sulindac (CLINORIL) 200 MG tablet Take 200 mg by mouth 2 (two)  times daily.   Yes Historical Provider, MD    BP 217/114 mmHg  Pulse 89  Temp(Src) 98 F (36.7 C) (Oral)  Resp 19  Ht 5\' 11"  (1.803 m)  SpO2 97% Physical Exam  Constitutional: No distress.  Chronically ill-appearing male in no acute distress.  HENT:  Head: Normocephalic and atraumatic.  Right Ear: External ear normal.  Left Ear: External ear normal.  Nose: Nose normal.  Mouth/Throat: Uvula is midline, oropharynx is clear and moist and mucous membranes are normal.  Eyes: Conjunctivae, EOM and lids are normal. Pupils are equal, round, and reactive to light. Right eye exhibits no discharge. Left eye exhibits no discharge. No scleral icterus.  Neck: Normal range of motion. Neck supple.  Cardiovascular: Normal rate, regular rhythm, normal heart sounds, intact distal pulses and normal pulses.   Pulmonary/Chest: Effort normal and breath sounds normal. No respiratory distress. He has no wheezes. He has no rales.  Abdominal: Soft. Normal appearance and bowel sounds are normal. He exhibits no distension and no mass. There is no tenderness. There is no rigidity, no rebound and no guarding.  Musculoskeletal: Normal range of motion. He exhibits no edema or tenderness.  Neurological: He is alert. He has normal strength. No cranial nerve deficit or sensory deficit.  Oriented to person, place, and situation.  Skin: Skin is warm, dry and intact. No rash noted. He is not diaphoretic. No erythema. No pallor.  Psychiatric: He has a normal mood and affect. His speech is normal and behavior is normal.  Nursing note and vitals reviewed.  ED Course  Procedures (including critical care time)  Labs Review Labs Reviewed  CBC WITH DIFFERENTIAL/PLATELET - Abnormal; Notable for the following:    WBC 18.5 (*)    Hemoglobin 12.6 (*)    HCT 38.8 (*)    RDW 17.4 (*)    Neutro Abs 15.3 (*)    Monocytes Absolute 1.5 (*)    All other components within normal limits  COMPREHENSIVE METABOLIC PANEL - Abnormal;  Notable for the following:    Sodium 133 (*)    Potassium 5.5 (*)    Chloride 97 (*)    CO2 20 (*)    BUN 53 (*)    Creatinine, Ser 7.78 (*)    Total Protein 9.2 (*)    Albumin 3.0 (*)    AST 43 (*)    ALT 14 (*)    GFR calc non Af Amer 7 (*)    GFR calc Af Amer 8 (*)    Anion gap 16 (*)    All other components within normal limits  I-STAT TROPOININ, ED - Abnormal; Notable for the following:    Troponin i, poc 0.11 (*)    All other components within normal limits  CULTURE, BLOOD (ROUTINE X 2)  CULTURE, BLOOD (ROUTINE X 2)  I-STAT CG4 LACTIC ACID, ED  I-STAT CG4 LACTIC ACID, ED    Imaging Review Dg Chest 1 View  02/18/2015  CLINICAL DATA:  Congestive heart failure EXAM: CHEST 1 VIEW COMPARISON:  August 16, 2014 FINDINGS: There is patchy airspace opacity in the right lower lobe. The lungs elsewhere clear. There is no appreciable edema. Heart is mildly enlarged with pulmonary vascular within normal limits. No adenopathy. There is postoperative change at the cervical -thoracic junction. IMPRESSION: Patchy opacity right base. Suspect pneumonia as more likely than edema. Lungs elsewhere clear. Stable cardiac prominence. Followup PA and lateral chest radiograph recommended if possible in 3-4 weeks following trial of antibiotic therapy to ensure resolution and exclude underlying malignancy. Electronically Signed   By: Bretta Bang III M.D.   On: 02/18/2015 12:35   Dg Sacrum/coccyx  02/18/2015  CLINICAL DATA:  Pain EXAM: SACRUM AND COCCYX - 2+ VIEW COMPARISON:  Lumbar MRI August 16, 2014 FINDINGS: Frontal and lateral views obtained. There is no demonstrable fracture or diastases. No erosive change or bony destruction. There is osteoarthritic change in each sacroiliac joint. There is extensive arterial vascular calcification. There is marked narrowing at L5-S1, similar to prior MR examination. 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 No fracture or  diastases. Extensive arterial vascular calcification present. Electronically Signed   By: Bretta Bang III M.D.   On: 02/18/2015 12:40   Ct Head Wo Contrast  02/18/2015  CLINICAL DATA:  Altered mental status EXAM: CT HEAD WITHOUT CONTRAST TECHNIQUE: Contiguous axial images were obtained from the base of the skull through the vertex without intravenous contrast. COMPARISON:  Head CT Sep 04, 2013 FINDINGS: There is moderate diffuse atrophy. There is no intracranial mass, hemorrhage, extra-axial fluid collection, or midline shift. There is moderate patchy small vessel disease throughout the centra semiovale bilaterally. No acute infarct is evident. The bony calvarium appears intact.  The mastoid air cells are clear. There is mucosal thickening in the right maxillary antrum. There is extensive opacification throughout the left maxillary antrum with areas containing calcification. There is apparent destruction expansion of the medial left maxillary antral wall with soft tissue opacity extending into and obstructing the left naris. There is also extensive  mucosal thickening throughout most of the ethmoid air cells. There is mucosal thickening in the inferior posterior aspects of each frontal sinus as well as mucosal thickening in the right sphenoid sinus. IMPRESSION: Atrophy with patchy periventricular small vessel disease. No intracranial mass, hemorrhage, or acute appearing infarct. Paranasal sinus disease. Note that there is bony expansion and destruction along the medial left maxillary antrum with soft tissue opacity extending throughout the left maxillary antrum and into the left naris. Question mucocele versus neoplasm arising from the left maxillary antrum. ENT consultation/ assessment in this regard advised. Electronically Signed   By: Bretta Bang III M.D.   On: 02/18/2015 13:11     I have personally reviewed and evaluated these images and lab results as part of my medical decision-making.    EKG Interpretation None      MDM   Final diagnoses:  Altered mental status  HCAP (healthcare-associated pneumonia)    59 year old male presents from dialysis with tailbone pain and bilateral foot pain, noted to appear altered by facility. Denies numbness, weakness, paresthesia, headache, LOC. Patient is a poor historian, and becomes distracted easily. Patient is afebrile. Hypertensive. Heart regular rate and rhythm. Lungs clear to auscultation bilaterally. Abdomen soft, non-tender, non-distended. Normal neuro exam with no focal deficit. He is alert and oriented to person, place, and situation. No tenderness to palpation of feet bilaterally. Patient moves extremities without difficulty. Strength and sensation intact. Distal pulses intact.  EKG no acute ischemia. Troponin 0.11. Appears patient's troponin is chronically elevated. CBC with leukocytosis of 18.5. Lactic acid 1.28. CMP remarkable for potassium 5.5, creatinine 7.78. Chest x-ray reveals patchy opacity at right base, suspect pneumonia. Imaging of coccyx negative for fracture. Head CT negative for intracranial mass, hemorrhage, or acute appearing infarct.   Started on antibiotics for HCAP in the ED. Hospitalist consulted for admission. Spoke with hospitalist, who will admit the patient for further evaluation and management.  BP 151/140 mmHg  Pulse 84  Temp(Src) 98.5 F (36.9 C) (Oral)  Resp 22  Ht  (1.803 m)  SpO2 96%         Mady Gemma, PA-C 02/18/15 1549  Gerhard Munch, MD 02/19/15 [id number]

## 2015-02-18 NOTE — H&P (Signed)
[facility] History and Physical  [name] [name] DOB: Jun 05, 1955 DOA: 02/18/2015  Referring physician: Kerrie Buffalo PCP: Dyke Maes, MD   Chief Complaint: ams  HPI: [name] is a 59 y.o. male with a past medical history of CHF, end-stage renal disease on hemodialysis Monday Wednesday Friday, diabetes diet controlled, hypertension presents to the emergency from dialysis with the chief complaint of acute altered mental status he complains of low back pain. Initial evaluation in the emergency department includes a chest x-ray concerning for pneumonia, uncontrolled hypertension, leukocytosis.  Information is obtained from the chart and the patient however information from patient may be somewhat reliable secondary to acute encephalopathy. Patient was at his dialysis session and was sent to the emergency department as they noted to be altered. Reportedly only 2 L were removed during dialysis and usually for was removed. Patient's only complaint is low back pain. He denies chest pain shortness of breath cough headache nausea vomiting or diarrhea. He does report being dropped recently during his rehabilitation cessation at the facility where he resides. He denies hitting his head or loss of consciousness. In addition he complains of bilateral feet pain which he says is chronic.  In the emergency department comprehensive metabolic panel reveals a sodium of 133, potassium 5.5, chloride 97 CO2 20 UN 53 creatinine 7.78, complete blood count significant for WBCs 18.5 hemoglobin 12.6 medical 38.8. Lactic acid within the limits of normal, initial troponin, 0.11. Chest x-ray patchy opacity right base. Suspect pneumonia as more likely than edema. Lungs elsewhere clear. Stable cardiac prominence, CT of the head atrophy with patchy periventricular small vessel disease. No intracranial mass, hemorrhage, or acute appearing infarct.Paranasal sinus disease. Note that there is bony expansion  and destruction along the medial left maxillary antrum with soft tissue opacity extending throughout the left maxillary antrum and into the left naris. Question mucocele versus neoplasm arising from the left maxillary antrum. X-ray of the lumbar spine reveals extensive arthropathy at L5-S1, grossly stable from prior MR examination. No fracture or diastases. Extensive arterial vascular calcification present.  In the emergency department he is provided with [facility] and vancomycin.  Review of Systems:  10 point review of systems complete and all systems are negative except as indicated in the history of present illness  Past Medical History  Diagnosis Date  . Anxiety   . Back pain   . GERD (gastroesophageal reflux disease)   . Peripheral neuropathy (HCC)   . Arthritis   . Chronic diastolic CHF (congestive heart failure) (HCC)     a. 04/2013 Echo: EF nl, no rwma, mild LVH.  Marland Kitchen Active smoker   . ESRD on hemodialysis (HCC)     [name] 4 days per week on M-Tu-Wed and Fri.  Started HD in 1998 and has been on HD initially at [facility], then went to [facility], then in [facility], then to [facility] and now is at [facility] for last 10 years.  Has L thigh AVG.     . Diabetes mellitus     diet controlled  . 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 PONV (postoperative nausea and vomiting)   . Hypertension   . Bell palsy   . Carpal tunnel syndrome     bilateral  . Palpitations   . Headache(784.0)     Hx: of Migraines  . CAD (coronary artery disease)     a. 04/2013 Ant STEMI/PCI: LM nl, LAD  95p (3.0x18 Xience DES), D1/2/3 small, LCX 80ost, RI 20p, RCA 19m CTO (unsuccessful PCI), EF 55%, mod basal inf HK.  . S/P coronary artery stent placement 05/21/2013    Xience Alpine Medtronic conditional 5 @ 1.5 and 3T    Past Surgical History  Procedure Laterality Date  . Total hip arthroplasty    . Thyroidectomy    . Tooth extraction    . Mandible fracture surgery    . Dg av dialysis  graft declot or    . Insertion of dialysis catheter  03/28/2011    Procedure: INSERTION OF DIALYSIS CATHETER;  Surgeon: Pryor Ochoa, MD;  Location: [facility] OR;  Service: Vascular;  Laterality: Left;  . Av fistula placement  03/31/2011    Procedure: INSERTION OF ARTERIOVENOUS (AV) GORE-TEX GRAFT THIGH;  Surgeon: Sherren Kerns, MD;  Location: [facility] OR;  Service: Vascular;  Laterality: Right;  redo right thigh arteriovenous gortex graft using gore-tex stretch 6mm x 70cm  . Thrombectomy w/ embolectomy  06/02/2011    Procedure: THROMBECTOMY ARTERIOVENOUS GORE-TEX GRAFT;  Surgeon: Chuck Hint, MD;  Location: [facility] OR;  Service: Vascular;  Laterality: Right;  Thrombectomy right thigh arteriovenous gortex graft;  revision by  replacement of large portion of graft with 7mm gore-tex   . Insertion of dialysis catheter  08/28/2011    Procedure: INSERTION OF DIALYSIS CATHETER;  Surgeon: Fransisco Hertz, MD;  Location: [facility] OR;  Service: Vascular;  Laterality: Left;  Atempted Bilateral Internal Jugular, Bilateral Subclavin insertion of 55cm Dialysis Catheter Left Femoral  . Insertion of dialysis catheter  12/15/2011    Procedure: INSERTION OF DIALYSIS CATHETER;  Surgeon: Sherren Kerns, MD;  Location: [facility] OR;  Service: Vascular;  Laterality: Right;  Insertion of Right Femoral Dialysis Catheter  . Exchange of a dialysis catheter  02/26/2012    Procedure: EXCHANGE OF A DIALYSIS CATHETER;  Surgeon: Larina Earthly, MD;  Location: [facility] OR;  Service: Vascular;  Laterality: Right;  . Joint replacement    . Exchange of a dialysis catheter  05/18/2012    Procedure: EXCHANGE OF A DIALYSIS CATHETER;  Surgeon: Larina Earthly, MD;  Location: [facility] OR;  Service: Vascular;  Laterality: Right;  right femoral dialysis catheter  . Av fistula placement  05/18/2012    Procedure: INSERTION OF ARTERIOVENOUS (AV) GORE-TEX GRAFT THIGH;  Surgeon: Larina Earthly, MD;  Location: [facility] OR;  Service: Vascular;  Laterality: Left;  using 6mm by 70cm goretex graft    . Thrombectomy w/ embolectomy Left 08/25/2012    Procedure: THROMBECTOMY ARTERIOVENOUS GORE-TEX GRAFT;  Surgeon: Pryor Ochoa, MD;  Location: [facility] OR;  Service: Vascular;  Laterality: Left;  Attempted thrombectomy of left thigh arteriovenous gortex graft.   . Av fistula placement Left 08/25/2012    Procedure: INSERTION OF ARTERIOVENOUS (AV) GORE-TEX GRAFT THIGH;  Surgeon: Pryor Ochoa, MD;  Location: [facility] OR;  Service: Vascular;  Laterality: Left;  Using 6mm x 40cm vascular Gortex graft.  . Revision of arteriovenous goretex graft Left 12/14/2012    Procedure: Revision of Left Thigh Graft;  Surgeon: Larina Earthly, MD;  Location: [facility] OR;  Service: Vascular;  Laterality: Left;  . Avgg removal Left 02/16/2013    Procedure: EXCISION OF LEFT ARM ARTERIOVENOUS GORETEX GRAFT TIMES 2 WITH VEIN PATCH ANGIOPLASTY OF BRACIAL ARTERY.  ;  Surgeon: Chuck Hint, MD;  Location: [facility] OR;  Service: Vascular;  Laterality: Left;  Converted from MAC to General.    . Revision of arteriovenous goretex  graft Left 08/08/2013    Procedure: REVISION OF LEFT THIGH ARTERIOVENOUS GORETEX GRAFT;  Surgeon: Sherren Kerns, MD;  Location: [facility] OR;  Service: Vascular;  Laterality: Left;  . Venogram Bilateral 10/27/2011    Procedure: VENOGRAM;  Surgeon: Nada Libman, MD;  Location: [facility] CATH LAB;  Service: Cardiovascular;  Laterality: Bilateral;  bilat upper extrem venograms  . Left heart cath Bilateral 05/21/2013    Procedure: LEFT HEART CATH;  Surgeon: Iran Ouch, MD;  Location: [facility] CATH LAB;  Service: Cardiovascular;  Laterality: Bilateral;  . Percutaneous coronary stent intervention (pci-s)  05/21/2013    Procedure: PERCUTANEOUS CORONARY STENT INTERVENTION (PCI-S);  Surgeon: Iran Ouch, MD;  Location: [facility] CATH LAB;  Service: Cardiovascular;;  . Coronary angioplasty with stent placement    . Radiology with anesthesia N/A 06/27/2014    Procedure: MRI LUMBER WITHOUT CONTRAST;  Surgeon: Medication Radiologist, MD;  Location: [facility]  OR;  Service: Radiology;  Laterality: N/A;  . Revision of arteriovenous goretex graft Left 08/13/2014    Procedure: REVISION OF ARTERIOVENOUS GORETEX GRAFT LEFT THIGH;  Surgeon: Sherren Kerns, MD;  Location: [facility] OR;  Service: Vascular;  Laterality: Left;  . Radiology with anesthesia N/A 08/16/2014    Procedure: MRI;  Surgeon: Medication Radiologist, MD;  Location: [facility] OR;  Service: Radiology;  Laterality: N/A;  . Thrombectomy w/ embolectomy Left 08/22/2014    Procedure: THROMBECTOMY ARTERIOVENOUS GORE-TEX GRAFT Of Left Thigh Graft;  Surgeon: Larina Earthly, MD;  Location: [facility] OR;  Service: Vascular;  Laterality: Left;  . Peripheral vascular catheterization N/A 10/09/2014    Procedure: Shuntogram;  Surgeon: Nada Libman, MD;  Location: [facility] INVASIVE CV LAB;  Service: Cardiovascular;  Laterality: N/A;  . Av fistula placement Left 10/30/2014    Procedure: INSERTION OF LEFT THIGH GRAFT;  Surgeon: Nada Libman, MD;  Location: [facility] OR;  Service: Vascular;  Laterality: Left;  . Removal of graft Left 10/30/2014    Procedure: REMOVAL OF LEFT THIGH GRAFT;  Surgeon: Nada Libman, MD;  Location: [facility] OR;  Service: Vascular;  Laterality: Left;  . Peripheral vascular catheterization Left 01/01/2015    Procedure:  Shuntogram;  Surgeon: Nada Libman, MD;  Location: [facility] INVASIVE CV LAB;  Service: Cardiovascular;  Laterality: Left;   Social History:  reports that he has been smoking Cigarettes.  He has a 8 pack-year smoking history. He has never used smokeless tobacco. He reports that he does not drink alcohol or use illicit drugs.  No Known Allergies  Family History  Problem Relation Age of Onset  . Diabetes Mother   . Stroke Mother   . Heart disease Mother   . Kidney disease Father   . Hyperlipidemia Father      Prior to Admission medications   Medication Sig Start Date End Date Taking? Authorizing Provider  Amino Acids-Protein Hydrolys (FEEDING SUPPLEMENT, PRO-STAT SUGAR FREE 64,) LIQD Take 30 mLs by mouth  2 (two) times daily.   Yes Historical Provider, MD  aspirin 81 MG tablet Take 1 tablet (81 mg total) by mouth daily. 05/24/13  Yes Rhonda G Barrett, PA-C  atorvastatin (LIPITOR) 80 MG tablet Take 1 tablet (80 mg total) by mouth daily at 6 PM. 05/24/13  Yes Rhonda G Barrett, PA-C  b complex vitamins tablet Take 1 tablet by mouth daily.   Yes Historical Provider, MD  B Complex-C (B-COMPLEX WITH VITAMIN C) tablet Take 1 tablet by mouth at bedtime.   Yes Historical Provider, MD  baclofen (LIORESAL) 10  MG tablet Take 5 mg by mouth 2 (two) times daily.   Yes Historical Provider, MD  calcium carbonate (TUMS - DOSED IN MG ELEMENTAL CALCIUM) 500 MG chewable tablet Chew 3 tablets by mouth 3 (three) times daily.   Yes Historical Provider, MD  clopidogrel (PLAVIX) 75 MG tablet Take 1 tablet (75 mg total) by mouth daily. 10/10/14  Yes Ashly M Gottschalk, DO  colchicine 0.6 MG tablet Take 0.6 mg by mouth every 12 (twelve) hours as needed (gout).    Yes Historical Provider, MD  dorzolamide-timolol (COSOPT) 22.3-6.8 MG/ML ophthalmic solution Place 1 drop into both eyes 3 (three) times daily.   Yes Historical Provider, MD  escitalopram (LEXAPRO) 10 MG tablet Take 10 mg by mouth daily.   Yes Historical Provider, MD  famotidine (PEPCID) 20 MG tablet Take 20 mg by mouth 2 (two) times daily.   Yes Historical Provider, MD  fentaNYL (DURAGESIC - DOSED MCG/HR) 50 MCG/HR Apply one patch topically every 72 hours for pain. Remove old patch 10/08/14  Yes Tiffany L Reed, DO  gabapentin (NEURONTIN) 100 MG capsule Take 100 mg by mouth every 12 (twelve) hours as needed (neuropathy).  12/29/13  Yes Historical Provider, MD  latanoprost (XALATAN) 0.005 % ophthalmic solution Place 1 drop into both eyes at bedtime.  04/20/14  Yes Historical Provider, MD  loperamide (IMODIUM A-D) 2 MG tablet Take 4 mg by mouth every 4 (four) hours as needed for diarrhea or loose stools.   Yes Historical Provider, MD  LORazepam (ATIVAN) 0.5 MG tablet Take 1  tablet (0.5 mg total) by mouth every 8 (eight) hours as needed for anxiety. 08/21/14  Yes Leroy Sea, MD  Menthol, Topical Analgesic, 4 % GEL Apply 1 application topically every 6 (six) hours as needed (pain). Apply to shoulders/lower back   Yes Historical Provider, MD  nitroGLYCERIN (NITROSTAT) 0.4 MG SL tablet Place 1 tablet (0.4 mg total) under the tongue every 5 (five) minutes as needed for chest pain. 05/24/13  Yes Rhonda G Barrett, PA-C  oxyCODONE (OXY IR/ROXICODONE) 5 MG immediate release tablet Take 10 mg by mouth See admin instructions. 1 hour before dialysis on MWF   Yes Historical Provider, MD  sodium bicarbonate 650 MG tablet Take 650 mg by mouth 2 (two) times daily.     Yes Historical Provider, MD  sulindac (CLINORIL) 200 MG tablet Take 200 mg by mouth 2 (two) times daily.   Yes Historical Provider, MD   Physical Exam: Filed Vitals:   02/18/15 1054 02/18/15 1115  BP: 217/114 206/110  Pulse: 89 88  Temp: 98 F (36.7 C)   TempSrc: Oral   Resp: 19 19  Height: 5\' 11"  (1.803 m)   SpO2: 97% 93%    Wt Readings from Last 3 Encounters:  01/01/15 112.492 kg (248 lb)  10/30/14 134 kg (295 lb 6.7 oz)  10/18/14 116.121 kg (256 lb)    General:  Appears calm and comfortable, slightly lethargic Eyes: PERRL, normal lids, irises & conjunctiva ENT: grossly normal hearing, his membranes of his mouth very dry but pink Neck: no LAD, masses or thyromegaly Cardiovascular: RRR, no m/r/g. B/l pitting edema R> L  Respiratory: CTA bilaterally, no w/r/r. Normal respiratory effort. Abdomen: soft, ntnd +BS but sluggish Skin: no rash or induration seen on limited exam Musculoskeletal: grossly normal tone BUE/BLE Psychiatric: grossly normal mood and affect, speech fluent and appropriate Neurologic: grossly non-focal. Speech slow but clear. Follows commands. MaE          Labs  on Admission:  Basic Metabolic Panel:  Recent Labs Lab 02/18/15 1114  NA 133*  K 5.5*  CL 97*  CO2 20*    GLUCOSE 80  BUN 53*  CREATININE 7.78*  CALCIUM 9.5   Liver Function Tests:  Recent Labs Lab 02/18/15 1114  AST 43*  ALT 14*  ALKPHOS 98  BILITOT 0.9  PROT 9.2*  ALBUMIN 3.0*   No results for input(s): LIPASE, AMYLASE in the last 168 hours. No results for input(s): AMMONIA in the last 168 hours. CBC:  Recent Labs Lab 02/18/15 1114  WBC 18.5*  NEUTROABS 15.3*  HGB 12.6*  HCT 38.8*  MCV 85.1  PLT 261   Cardiac Enzymes: No results for input(s): CKTOTAL, CKMB, CKMBINDEX, TROPONINI in the last 168 hours.  BNP (last 3 results) No results for input(s): BNP in the last 8760 hours.  ProBNP (last 3 results) No results for input(s): PROBNP in the last 8760 hours.  CBG: No results for input(s): GLUCAP in the last 168 hours.  Radiological Exams on Admission: Dg Chest 1 View  02/18/2015  CLINICAL DATA:  Congestive heart failure EXAM: CHEST 1 VIEW COMPARISON:  August 16, 2014 FINDINGS: There is patchy airspace opacity in the right lower lobe. The lungs elsewhere clear. There is no appreciable edema. Heart is mildly enlarged with pulmonary vascular within normal limits. No adenopathy. There is postoperative change at the cervical -thoracic junction. IMPRESSION: Patchy opacity right base. Suspect pneumonia as more likely than edema. Lungs elsewhere clear. Stable cardiac prominence. Followup PA and lateral chest radiograph recommended if possible in 3-4 weeks following trial of antibiotic therapy to ensure resolution and exclude underlying malignancy. Electronically Signed   By: Bretta Bang III M.D.   On: 02/18/2015 12:35   Dg Sacrum/coccyx  02/18/2015  CLINICAL DATA:  Pain EXAM: SACRUM AND COCCYX - 2+ VIEW COMPARISON:  Lumbar MRI August 16, 2014 FINDINGS: Frontal and lateral views obtained. There is no demonstrable fracture or diastases. No erosive change or bony destruction. There is osteoarthritic change in each sacroiliac joint. There is extensive arterial vascular  calcification. There is marked narrowing at L5-S1, similar to prior MR examination. 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 No fracture or diastases. Extensive arterial vascular calcification present. Electronically Signed   By: Bretta Bang III M.D.   On: 02/18/2015 12:40   Ct Head Wo Contrast  02/18/2015  CLINICAL DATA:  Altered mental status EXAM: CT HEAD WITHOUT CONTRAST TECHNIQUE: Contiguous axial images were obtained from the base of the skull through the vertex without intravenous contrast. COMPARISON:  Head CT Sep 04, 2013 FINDINGS: There is moderate diffuse atrophy. There is no intracranial mass, hemorrhage, extra-axial fluid collection, or midline shift. There is moderate patchy small vessel disease throughout the centra semiovale bilaterally. No acute infarct is evident. The bony calvarium appears intact.  The mastoid air cells are clear. There is mucosal thickening in the right maxillary antrum. There is extensive opacification throughout the left maxillary antrum with areas containing calcification. There is apparent destruction expansion of the medial left maxillary antral wall with soft tissue opacity extending into and obstructing the left naris. There is also extensive mucosal thickening throughout most of the ethmoid air cells. There is mucosal thickening in the inferior posterior aspects of each frontal sinus as well as mucosal thickening in the right sphenoid sinus. IMPRESSION: Atrophy with patchy periventricular small vessel disease. No intracranial mass, hemorrhage, or acute appearing infarct. Paranasal sinus disease. Note that there is  bony expansion and destruction along the medial left maxillary antrum with soft tissue opacity extending throughout the left maxillary antrum and into the left naris. Question mucocele versus neoplasm arising from the left maxillary antrum. ENT consultation/ assessment in this regard advised. Electronically  Signed   By: Bretta Bang III M.D.   On: 02/18/2015 13:11    EKG:   Assessment/Plan Principal Problem:   Acute encephalopathy: Likely related to infectious process specifically pneumonia in end-stage renal disease patient is dialysis was interrupted today. CT of his head was negative for hemorrhage or acute infarct.  Will admit to telemetry. Exam improved since presentation. Will keep nothing by mouth until fully awake. Will contact nephrology for dialysis as indicated. Will obtain sputum culture is able blood cultures per protocol. Will provide antibiotics per protocol.   Active Problems: HCAP (healthcare-associated pneumonia) : Patient lives at a rehabilitation facility as well as 3 times a week dialysis. White count as noted above. Currently is afebrile and somewhat lethargic. Antibiotics  per protocol. Blood cultures sputum culture. Monitor closely  HTN (hypertension): Past medical history of same. Home medications do not include antihypertensive meds. Will provide when necessary labetalol with parameters. Blood pressure on admission 206/110.    End-stage renal disease on hemodialysis [facility]): Reportedly patient is a Monday Wednesday Friday dialysis patient. 2 days dialysis was interrupted due to #1. ED staff report only 2 L removed when usually 4 L was removed. Will request nephrology consult.      Hyperkalemia: Potassium level 5.5 on admission. See above. May need to finish out dialysis.    CAD (coronary artery disease)/ Elevated troponin: Somewhat chronic. Denies chest pain. Troponin only mildly elevated and close to his baseline range.  Normocytic, normochromic anemia: Hemoglobin currently 12.2 which is above his baseline. Will monitor  Diabetes type 2. Not on medication. Will obtain a hemoglobin A1c. Use sliding scale for optimal control  Chronic pain. Has a history of chronic low back pain as well as bilateral feet pain. Will currently hold his gabapentin and his oral oxycodone  due to #1. Continue fentanyl patch. Monitor closely       Dr Pollyann Kennedy ENT Dr Eliott Nine nephrology    Code Status: full DVT Prophylaxis: Family Communication: none present Disposition Plan: back to facility  Time spent: 65 minutes  [facility]   I have examined the patient, reviewed the chart, modified the above note and discussed the plan with [name] Smothers, NP. I agree with the assessment and plan as outlined above.   [name] Cantor, MD

## 2015-02-18 NOTE — Procedures (Signed)
I have personally attended this patient's dialysis session.   Cannulating thigh AVG Hope can keep pt on the machine long enough to remove some volume Plan 2K/3.5 Ca  bath, 3-3.5 liter goal  Camille Bal, MD [facility] [phone] Pager 02/18/2015, 5:23 PM

## 2015-02-18 NOTE — ED Notes (Signed)
Pt is refusing to be placed on cardiac monitor or bp cuff.

## 2015-02-18 NOTE — Progress Notes (Signed)
Deaccessed graft ,no bleeding noted,drsg in place ,pt tolerated well.

## 2015-02-18 NOTE — Consult Note (Addendum)
  ENT consult  Brief history obtained. Patient denies any nasal or sinus complaints. He drifts off to sleep frequently during the encounter. Head CT reviewed. Likely mucocele or fungal sinusitis with bony remodeling. Unable to examine as ENT equipment did not make it to the floor. Will order a Maxillofacial CT and will try to examine in the morning.  CT C spine from 2013 reviewed. Left maxillary sinus disease was present then as well. This is a chronic problem and unlikely related to the current disease process. Pt not on floor, at HD. Will try to complete consult tomorrow,

## 2015-02-19 ENCOUNTER — Observation Stay (HOSPITAL_COMMUNITY): Payer: Medicare Other

## 2015-02-19 DIAGNOSIS — M479 Spondylosis, unspecified: Secondary | ICD-10-CM | POA: Diagnosis present

## 2015-02-19 DIAGNOSIS — Z993 Dependence on wheelchair: Secondary | ICD-10-CM | POA: Diagnosis not present

## 2015-02-19 DIAGNOSIS — M79672 Pain in left foot: Secondary | ICD-10-CM | POA: Diagnosis present

## 2015-02-19 DIAGNOSIS — G9340 Encephalopathy, unspecified: Secondary | ICD-10-CM | POA: Diagnosis not present

## 2015-02-19 DIAGNOSIS — R930 Abnormal findings on diagnostic imaging of skull and head, not elsewhere classified: Secondary | ICD-10-CM

## 2015-02-19 DIAGNOSIS — Z9115 Patient's noncompliance with renal dialysis: Secondary | ICD-10-CM | POA: Diagnosis not present

## 2015-02-19 DIAGNOSIS — Y95 Nosocomial condition: Secondary | ICD-10-CM | POA: Diagnosis present

## 2015-02-19 DIAGNOSIS — Z955 Presence of coronary angioplasty implant and graft: Secondary | ICD-10-CM | POA: Diagnosis not present

## 2015-02-19 DIAGNOSIS — Z7982 Long term (current) use of aspirin: Secondary | ICD-10-CM | POA: Diagnosis not present

## 2015-02-19 DIAGNOSIS — K219 Gastro-esophageal reflux disease without esophagitis: Secondary | ICD-10-CM | POA: Diagnosis present

## 2015-02-19 DIAGNOSIS — I2510 Atherosclerotic heart disease of native coronary artery without angina pectoris: Secondary | ICD-10-CM | POA: Diagnosis present

## 2015-02-19 DIAGNOSIS — D631 Anemia in chronic kidney disease: Secondary | ICD-10-CM | POA: Diagnosis present

## 2015-02-19 DIAGNOSIS — N186 End stage renal disease: Secondary | ICD-10-CM | POA: Diagnosis present

## 2015-02-19 DIAGNOSIS — Z794 Long term (current) use of insulin: Secondary | ICD-10-CM | POA: Diagnosis not present

## 2015-02-19 DIAGNOSIS — I739 Peripheral vascular disease, unspecified: Secondary | ICD-10-CM | POA: Diagnosis present

## 2015-02-19 DIAGNOSIS — J329 Chronic sinusitis, unspecified: Secondary | ICD-10-CM | POA: Diagnosis present

## 2015-02-19 DIAGNOSIS — R7989 Other specified abnormal findings of blood chemistry: Secondary | ICD-10-CM | POA: Diagnosis not present

## 2015-02-19 DIAGNOSIS — M545 Low back pain: Secondary | ICD-10-CM | POA: Diagnosis present

## 2015-02-19 DIAGNOSIS — F17210 Nicotine dependence, cigarettes, uncomplicated: Secondary | ICD-10-CM | POA: Diagnosis present

## 2015-02-19 DIAGNOSIS — I5032 Chronic diastolic (congestive) heart failure: Secondary | ICD-10-CM | POA: Diagnosis present

## 2015-02-19 DIAGNOSIS — I132 Hypertensive heart and chronic kidney disease with heart failure and with stage 5 chronic kidney disease, or end stage renal disease: Secondary | ICD-10-CM | POA: Diagnosis present

## 2015-02-19 DIAGNOSIS — I674 Hypertensive encephalopathy: Secondary | ICD-10-CM | POA: Diagnosis present

## 2015-02-19 DIAGNOSIS — Z992 Dependence on renal dialysis: Secondary | ICD-10-CM | POA: Diagnosis not present

## 2015-02-19 DIAGNOSIS — M1990 Unspecified osteoarthritis, unspecified site: Secondary | ICD-10-CM | POA: Diagnosis present

## 2015-02-19 DIAGNOSIS — Z720 Tobacco use: Secondary | ICD-10-CM | POA: Diagnosis not present

## 2015-02-19 DIAGNOSIS — E119 Type 2 diabetes mellitus without complications: Secondary | ICD-10-CM | POA: Diagnosis present

## 2015-02-19 DIAGNOSIS — E875 Hyperkalemia: Secondary | ICD-10-CM | POA: Diagnosis present

## 2015-02-19 DIAGNOSIS — I252 Old myocardial infarction: Secondary | ICD-10-CM | POA: Diagnosis not present

## 2015-02-19 DIAGNOSIS — M79671 Pain in right foot: Secondary | ICD-10-CM | POA: Diagnosis present

## 2015-02-19 DIAGNOSIS — B182 Chronic viral hepatitis C: Secondary | ICD-10-CM | POA: Diagnosis present

## 2015-02-19 DIAGNOSIS — N2581 Secondary hyperparathyroidism of renal origin: Secondary | ICD-10-CM | POA: Diagnosis present

## 2015-02-19 DIAGNOSIS — Z79899 Other long term (current) drug therapy: Secondary | ICD-10-CM | POA: Diagnosis not present

## 2015-02-19 DIAGNOSIS — G8929 Other chronic pain: Secondary | ICD-10-CM | POA: Diagnosis present

## 2015-02-19 DIAGNOSIS — R4182 Altered mental status, unspecified: Secondary | ICD-10-CM | POA: Diagnosis present

## 2015-02-19 DIAGNOSIS — J189 Pneumonia, unspecified organism: Secondary | ICD-10-CM | POA: Diagnosis present

## 2015-02-19 DIAGNOSIS — F419 Anxiety disorder, unspecified: Secondary | ICD-10-CM | POA: Diagnosis present

## 2015-02-19 LAB — CBC
HEMATOCRIT: 34.3 % — AB (ref 39.0–52.0)
HEMOGLOBIN: 10.9 g/dL — AB (ref 13.0–17.0)
MCH: 26.7 pg (ref 26.0–34.0)
MCHC: 31.8 g/dL (ref 30.0–36.0)
MCV: 84.1 fL (ref 78.0–100.0)
Platelets: 265 10*3/uL (ref 150–400)
RBC: 4.08 MIL/uL — AB (ref 4.22–5.81)
RDW: 17.3 % — ABNORMAL HIGH (ref 11.5–15.5)
WBC: 15.2 10*3/uL — AB (ref 4.0–10.5)

## 2015-02-19 LAB — COMPREHENSIVE METABOLIC PANEL
ALT: 14 U/L — AB (ref 17–63)
AST: 38 U/L (ref 15–41)
Albumin: 2.5 g/dL — ABNORMAL LOW (ref 3.5–5.0)
Alkaline Phosphatase: 88 U/L (ref 38–126)
Anion gap: 14 (ref 5–15)
BUN: 32 mg/dL — AB (ref 6–20)
CHLORIDE: 95 mmol/L — AB (ref 101–111)
CO2: 23 mmol/L (ref 22–32)
CREATININE: 5.75 mg/dL — AB (ref 0.61–1.24)
Calcium: 9.3 mg/dL (ref 8.9–10.3)
GFR calc Af Amer: 11 mL/min — ABNORMAL LOW (ref >60)
GFR, EST NON AFRICAN AMERICAN: 10 mL/min — AB (ref >60)
Glucose, Bld: 77 mg/dL (ref 65–99)
Potassium: 4.8 mmol/L (ref 3.5–5.1)
Sodium: 132 mmol/L — ABNORMAL LOW (ref 135–145)
Total Bilirubin: 0.9 mg/dL (ref 0.3–1.2)
Total Protein: 7.5 g/dL (ref 6.5–8.1)

## 2015-02-19 LAB — GLUCOSE, CAPILLARY
GLUCOSE-CAPILLARY: 78 mg/dL (ref 65–99)
GLUCOSE-CAPILLARY: 94 mg/dL (ref 65–99)
GLUCOSE-CAPILLARY: 94 mg/dL (ref 65–99)
Glucose-Capillary: 85 mg/dL (ref 65–99)
Glucose-Capillary: 74 mg/dL (ref 65–99)

## 2015-02-19 LAB — HIV ANTIBODY (ROUTINE TESTING W REFLEX): HIV SCREEN 4TH GENERATION: NONREACTIVE

## 2015-02-19 LAB — MRSA PCR SCREENING: MRSA by PCR: NEGATIVE

## 2015-02-19 MED ORDER — DARBEPOETIN ALFA 100 MCG/0.5ML IJ SOSY
100.0000 ug | PREFILLED_SYRINGE | INTRAMUSCULAR | Status: DC
  Filled 2015-02-19: qty 0.5

## 2015-02-19 NOTE — Progress Notes (Signed)
Physical Therapy Evaluation Patient Details Name: [name] MRN: [id number] DOB: Sep 18, 1955 Today's Date: 02/19/2015   History of Present Illness  [name] is a 59 y.o. male with a past medical history of CHF, end-stage renal disease on hemodialysis Monday Wednesday Friday, diabetes diet controlled, hypertension presents to the emergency from dialysis with the chief complaint of acute altered mental status he complains of low back pain. Initial evaluation in the emergency department includes a chest x-ray concerning for pneumonia, uncontrolled hypertension, leukocytosis  Clinical Impression  Pt admitted with above complications. Pt currently with functional limitations due to the deficits listed below (see PT Problem List). [name] is a poor historian at this time, unable to provide an accurate history and oriented to his name and birth date only. Limited evaluation to sitting EOB this date which required moderate assistance for bed mobility. Unsafe to stand at this time due to weakness and decreased safety/deficit awareness. Will follow and progress as tolerated. Pt will benefit from skilled PT to increase their independence and safety with mobility to allow discharge to the venue listed below.       Follow Up Recommendations SNF;Supervision/Assistance - 24 hour (Pending progress)    Equipment Recommendations  None recommended by PT    Recommendations for Other Services OT consult     Precautions / Restrictions Precautions Precautions: Fall Restrictions Weight Bearing Restrictions: No      Mobility  Bed Mobility Overal bed mobility: Needs Assistance Bed Mobility: Supine to Sit;Sit to Supine     Supine to sit: Mod assist;HOB elevated Sit to supine: Mod assist   General bed mobility comments: Mod assist for truncal and LE support into and out of supine position and EOB. Very rigid and fearful with movement. Difficulty sequencing. VC throughout and use of rail to  assist.  Transfers                 General transfer comment: Not safe to perform at this time due to weakness and decreased cognition/safety awareness  Ambulation/Gait                Stairs            Wheelchair Mobility    Modified Rankin (Stroke Patients Only)       Balance Overall balance assessment: Needs assistance Sitting-balance support: Single extremity supported;Feet supported Sitting balance-Leahy Scale: Poor Sitting balance - Comments: Tolerated sitting EOB x 10 minutes. Focused on seated balance, able to lift single extremity UE/LE at a time. Leans posterior initially but progressed to unsupported sitting with single UE for support. SpO2 dropped to 87% on room air, improved to 98% with 2L supplemental O2.                                     Pertinent Vitals/Pain Pain Assessment:  (CPOT 2/8) Pain Location: fingers, hips, knees, tailbone (RN notified of tailbone complaint) Pain Intervention(s): Monitored during session;Repositioned;Limited activity within patient's tolerance;Other (comment) (RN notified)    Home Living Family/patient expects to be discharged to:: Skilled nursing facility Living Arrangements: Spouse/significant other;Parent Available Help at Discharge: Family Type of Home: House       Home Layout: One level Home Equipment: Environmental consultant - 2 wheels;Shower seat;Wheelchair - power;Cane - single point;Adaptive equipment Additional Comments: Patient is a poor historian, states he lives with mother and wife, but cannot provide further information. The above history was obtained from  previous admission notes.    Prior Function Level of Independence: Needs assistance   Gait / Transfers Assistance Needed: Was amb with therapy at SNF.     Comments: Patient is a poor historian, no immediate family available to confirm information which was taken from previous admission notes.     Hand Dominance   Dominant Hand: Left     Extremity/Trunk Assessment   Upper Extremity Assessment: Defer to OT evaluation           Lower Extremity Assessment: Generalized weakness;Difficult to assess due to impaired cognition         Communication   Communication: No difficulties  Cognition Arousal/Alertness: Lethargic Behavior During Therapy: Flat affect Overall Cognitive Status: No family/caregiver present to determine baseline cognitive functioning Area of Impairment: Orientation;Following commands;Problem solving;Safety/judgement Orientation Level: Disoriented to;Place;Time;Situation   Memory: Decreased short-term memory Following Commands: Follows one step commands inconsistently Safety/Judgement: Decreased awareness of safety;Decreased awareness of deficits   Problem Solving: Slow processing;Decreased initiation;Difficulty sequencing;Requires verbal cues;Requires tactile cues      General Comments General comments (skin integrity, edema, etc.): SpO2 down to 87% on room air. up to 98% on 2L supplemental O2. Coughing up copious amounts of mucous.    Exercises General Exercises - Lower Extremity Long Arc Quad: Strengthening;Both;10 reps;Seated      Assessment/Plan    PT Assessment Patient needs continued PT services  PT Diagnosis Difficulty walking;Generalized weakness;Altered mental status;Acute pain   PT Problem List Decreased strength;Decreased range of motion;Decreased activity tolerance;Decreased balance;Decreased mobility;Decreased coordination;Decreased cognition;Decreased knowledge of use of DME;Decreased safety awareness;Cardiopulmonary status limiting activity;Pain;Obesity  PT Treatment Interventions DME instruction;Gait training;Functional mobility training;Therapeutic activities;Therapeutic exercise;Balance training;Neuromuscular re-education;Cognitive remediation;Patient/family education;Modalities   PT Goals (Current goals can be found in the Care Plan section) Acute Rehab PT Goals Patient  Stated Goal: none stated PT Goal Formulation: Patient unable to participate in goal setting Time For Goal Achievement: 03/05/15 Potential to Achieve Goals: Fair    Frequency Min 3X/week   Barriers to discharge Decreased caregiver support Unsure of home living situation. No family present at this time.    Co-evaluation               End of Session Equipment Utilized During Treatment: Oxygen Activity Tolerance: Patient limited by lethargy;Other (comment) (poor cognition, inability to consistently follow commands) Patient left: in bed;with call bell/phone within reach;with bed alarm set Nurse Communication: Mobility status;Other (comment);Need for lift equipment (SpO2, sacral pain)    Functional Assessment Tool Used: clinical observation Functional Limitation: Changing and maintaining body position Changing and Maintaining Body Position Current Status (W0981): At least 60 percent but less than 80 percent impaired, limited or restricted Changing and Maintaining Body Position Goal Status (X9147): At least 20 percent but less than 40 percent impaired, limited or restricted    Time: 8295-6213 PT Time Calculation (min) (ACUTE ONLY): 20 min   Charges:   PT Evaluation $Initial PT Evaluation Tier I: 1 Procedure     PT G Codes:   PT G-Codes **NOT FOR INPATIENT CLASS** Functional Assessment Tool Used: clinical observation Functional Limitation: Changing and maintaining body position Changing and Maintaining Body Position Current Status (Y8657): At least 60 percent but less than 80 percent impaired, limited or restricted Changing and Maintaining Body Position Goal Status (Q4696): At least 20 percent but less than 40 percent impaired, limited or restricted    Berton Mount 02/19/2015, 9:54 AM Charlsie Merles, [place] [phone]

## 2015-02-19 NOTE — Progress Notes (Signed)
[facility]                                                                              Patient Demographics  [name], is a 59 y.o. male, DOB - 10/15/1955, GMW:[id number]  Admit date - 02/18/2015   Admitting Physician Calvert Cantor, MD  Outpatient Primary MD for the patient is Dyke Maes, MD  LOS - 1   Chief Complaint  Patient presents with  . Altered Mental Status      HPI on 02/18/2015 by Ms. Toya Smothers, NP/Dr. [place] [name] is a 59 y.o. male with a past medical history of CHF, end-stage renal disease on hemodialysis Monday Wednesday Friday, diabetes diet controlled, hypertension presents to the emergency from dialysis with the chief complaint of acute altered mental status he complains of low back pain. Initial evaluation in the emergency department includes a chest x-ray concerning for pneumonia, uncontrolled hypertension, leukocytosis. Information is obtained from the chart and the patient however information from patient may be somewhat reliable secondary to acute encephalopathy. Patient was at his dialysis session and was sent to the emergency department as they noted to be altered. Reportedly only 2 L were removed during dialysis and usually for was removed. Patient's only complaint is low back pain. He denies chest pain shortness of breath cough headache nausea vomiting or diarrhea. He does report being dropped recently during his rehabilitation cessation at the facility where he resides. He denies hitting his head or loss of consciousness. In addition he complains of bilateral feet pain which he says is chronic. In the emergency department comprehensive metabolic panel reveals a sodium of 133, potassium 5.5, chloride 97 CO2 20 UN 53 creatinine 7.78, complete blood count significant for WBCs 18.5 hemoglobin 12.6 medical 38.8. Lactic acid within the limits of normal, initial troponin, 0.11. Chest x-ray patchy opacity right base. Suspect pneumonia as more  likely than edema. Lungs elsewhere clear. Stable cardiac prominence, CT of the head atrophy with patchy periventricular small vessel disease. No intracranial mass, hemorrhage, or acute appearing infarct.Paranasal sinus disease. Note that there is bony expansion and destruction along the medial left maxillary antrum with soft tissue opacity extending throughout the left maxillary antrum and into the left naris. Question mucocele versus neoplasm arising from the left maxillary antrum. X-ray of the lumbar spine reveals extensive arthropathy at L5-S1, grossly stable from prior MR examination. No fracture or diastases. Extensive arterial vascular calcification present. In the emergency department he is provided with [facility] and vancomycin.  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 Orbital soft tissue swelling suggestive of cellulitis  Left maxillary mucocele -On CT Maxillofacial -ENT consulted and appreciated, pending further recommendations and workup  HCAP (healthcare-associated pneumonia)  -Patient lives at a rehabilitation facility as well as 3 times a week dialysis.  -Continue vancomycin such as edema -WBC improving, currently afebrile -Blood cultures pending  HTN (hypertension)  -Patient is not on any home antihypertensive meds  -Continue labetalol  as needed   End-stage renal disease on hemodialysis [facility])  -Dialyzes Monday, Wednesday, Friday -Dialysis was interrupted the past 2 sessions due to altered mental status -Nephrology consult appreciated   Hyperkalemia  -Resolved, Potassium level 5.5 on admission.  -Continue to monitor BMP  CAD (coronary artery disease)/ Elevated troponin    -Somewhat chronic. Denies chest pain. Troponin only mildly elevated and close to his baseline range.  Normocytic, normochromic anemia  -Hemoglobin currently 10.9, above baseline of approximately 7-8  Diabetes mellitus, type 2  -Not on any home medication  -Continue and complaint field CBG monitoring -Pending hemoglobin A1c  Chronic pain  -Has a history of chronic low back pain as well as bilateral feet pain.  -Gabapentin oxycodone held due to acute mental status changes  -Continue fentanyl patch and monitor closely   Code Status: Full  Family Communication: None at bedside  Disposition Plan: Admitted, continue antibiotics  Time Spent in minutes   30 minutes  Procedures  None  Consults   ENT Nephrology    DVT Prophylaxis    Lab Results  Component Value Date   PLT 265 02/19/2015    Medications  Scheduled Meds: . cefTAZidime (FORTAZ)  IV  2 g Intravenous Q M,W,F-HD  . [START ON 02/20/2015] darbepoetin (ARANESP) injection - DIALYSIS  100 mcg Intravenous Q Wed-HD  . dorzolamide-timolol  1 drop Both Eyes TID  . [START ON 02/20/2015] fentaNYL  75 mcg Transdermal Q72H  . [START ON 02/20/2015] ferric gluconate (FERRLECIT/NULECIT) IV  62.5 mg Intravenous Q Wed-HD  . insulin aspart  0-5 Units Subcutaneous QHS  . insulin aspart  0-9 Units Subcutaneous TID WC  . latanoprost  1 drop Both Eyes QHS  . sodium chloride  3 mL Intravenous Q12H  . sodium chloride  3 mL Intravenous Q12H  . sulindac  200 mg Oral BID  . [START ON 02/20/2015] vancomycin  1,000 mg Intravenous Q M,W,F-HD   Continuous Infusions:  PRN Meds:.sodium chloride, acetaminophen **OR** acetaminophen, HYDROmorphone (DILAUDID) injection, labetalol, lidocaine, lidocaine, lidocaine-EPINEPHrine, LORazepam, nitroGLYCERIN, ondansetron **OR** ondansetron (ZOFRAN) IV, oxymetazoline, silver nitrate applicators, sodium chloride, TRIPLE ANTIBIOTIC  Antibiotics    Anti-infectives    Start     Dose/Rate Route Frequency  Ordered Stop   02/20/15 1200  vancomycin (VANCOCIN) IVPB 1000 mg/200 mL premix     1,000 mg 200 mL/hr over 60 Minutes Intravenous Every M-W-F (Hemodialysis) 02/18/15 1946     02/18/15 2200  cefTAZidime (FORTAZ) 2 g in dextrose 5 % 50 mL IVPB  Status:  Discontinued     2 g 100 mL/hr over 30 Minutes Intravenous 3 times per day 02/18/15 1440 02/18/15 1442   02/18/15 2200  cefTAZidime (FORTAZ) 2 g in dextrose 5 % 50 mL IVPB     2 g 100 mL/hr over 30 Minutes Intravenous Every M-W-F (Hemodialysis) 02/18/15 1945     02/18/15 1315  vancomycin (VANCOCIN) 2,500 mg in sodium chloride 0.9 % 500 mL IVPB     2,500 mg 250 mL/hr over 120 Minutes Intravenous  Once 02/18/15 1302 02/18/15 2216   02/18/15 1300  cefTAZidime (FORTAZ) 2 g in dextrose 5 % 50 mL IVPB     2 g 100 mL/hr over 30 Minutes Intravenous  Once 02/18/15 1251 02/18/15 1450        Subjective:   [name] seen and examined today.  Patient still appears mildly confused and complains of back and leg pain.  Denies chest pain, shortness of breath, abdominal pain.   Objective:  Filed Vitals:   02/18/15 2213 02/19/15 0500 02/19/15 0525 02/19/15 0856  BP: 158/131  167/99 110/54  Pulse: 108  98 85  Temp: 98.2 F (36.8 C)  98.6 F (37 C) 99.2 F (37.3 C)  TempSrc: Oral  Oral Oral  Resp: 18  19   Height:      Weight: 114.5 kg (252 lb 6.8 oz) 114.5 kg (252 lb 6.8 oz)    SpO2: 94%  95% 85%    Wt Readings from Last 3 Encounters:  02/19/15 114.5 kg (252 lb 6.8 oz)  01/01/15 112.492 kg (248 lb)  10/30/14 134 kg (295 lb 6.7 oz)     Intake/Output Summary (Last 24 hours) at 02/19/15 1328 Last data filed at 02/19/15 1050  Gross per 24 hour  Intake    360 ml  Output   3500 ml  Net  -3140 ml    Exam  General: Well developed, chronically ill, NAD  HEENT: NCAT, mucous membranes moist.   Cardiovascular: S1 S2 auscultated, no rubs, murmurs or gallops. 1/6SEM  Respiratory: Scattered rhonchi/crackles  Abdomen: Soft, nontender,  nondistended, + bowel sounds  Extremities: warm dry without cyanosis clubbing. Edema LLE >RLE  Neuro: AAOx1, disoriented to place and time, right sided weakness.   Psych: Normal affect and demeanor   Data Review   Micro Results Recent Results (from the past 240 hour(s))  Blood culture (routine x 2)     Status: None (Preliminary result)   Collection Time: 02/18/15  1:57 PM  Result Value Ref Range Status   Specimen Description BLOOD RIGHT FOREARM  Final   Special Requests BOTTLES DRAWN AEROBIC AND ANAEROBIC  Final   Culture PENDING  Incomplete   Report Status PENDING  Incomplete  MRSA PCR Screening     Status: None   Collection Time: 02/18/15 10:47 PM  Result Value Ref Range Status   MRSA by PCR NEGATIVE NEGATIVE Final    Comment:        The GeneXpert MRSA Assay (FDA approved for NASAL specimens only), is one component of a comprehensive MRSA colonization surveillance program. It is not intended to diagnose MRSA infection nor to guide or monitor treatment for MRSA infections.     Radiology Reports Dg Chest 1 View  02/18/2015  CLINICAL DATA:  Congestive heart failure EXAM: CHEST 1 VIEW COMPARISON:  August 16, 2014 FINDINGS: There is patchy airspace opacity in the right lower lobe. The lungs elsewhere clear. There is no appreciable edema. Heart is mildly enlarged with pulmonary vascular within normal limits. No adenopathy. There is postoperative change at the cervical -thoracic junction. IMPRESSION: Patchy opacity right base. Suspect pneumonia as more likely than edema. Lungs elsewhere clear. Stable cardiac prominence. Followup PA and lateral chest radiograph recommended if possible in 3-4 weeks following trial of antibiotic therapy to ensure resolution and exclude underlying malignancy. Electronically Signed   By: Bretta Bang III M.D.   On: 02/18/2015 12:35   Dg Chest 2 View  02/19/2015  CLINICAL DATA:  Cough and wheezing EXAM: CHEST - 2 VIEW COMPARISON:  02/18/2015  FINDINGS: Cardiac shadow remains enlarged. Increasing bibasilar infiltrates are noted when compare with the prior exam. Degenerative changes of the thoracic spine are noted. IMPRESSION: Increasing bibasilar infiltrates. Electronically Signed   By: Alcide Clever M.D.   On: 02/19/2015 11:06   Dg Sacrum/coccyx  02/18/2015  CLINICAL DATA:  Pain EXAM: SACRUM AND COCCYX - 2+ VIEW COMPARISON:  Lumbar MRI August 16, 2014 FINDINGS: Frontal and lateral views obtained.  There is no demonstrable fracture or diastases. No erosive change or bony destruction. There is osteoarthritic change in each sacroiliac joint. There is extensive arterial vascular calcification. There is marked narrowing at L5-S1, similar to prior MR examination. 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 No fracture or diastases. Extensive arterial vascular calcification present. Electronically Signed   By: Bretta Bang III M.D.   On: 02/18/2015 12:40   Ct Head Wo Contrast  02/18/2015  CLINICAL DATA:  Altered mental status EXAM: CT HEAD WITHOUT CONTRAST TECHNIQUE: Contiguous axial images were obtained from the base of the skull through the vertex without intravenous contrast. COMPARISON:  Head CT Sep 04, 2013 FINDINGS: There is moderate diffuse atrophy. There is no intracranial mass, hemorrhage, extra-axial fluid collection, or midline shift. There is moderate patchy small vessel disease throughout the centra semiovale bilaterally. No acute infarct is evident. The bony calvarium appears intact.  The mastoid air cells are clear. There is mucosal thickening in the right maxillary antrum. There is extensive opacification throughout the left maxillary antrum with areas containing calcification. There is apparent destruction expansion of the medial left maxillary antral wall with soft tissue opacity extending into and obstructing the left naris. There is also extensive mucosal thickening throughout most of the  ethmoid air cells. There is mucosal thickening in the inferior posterior aspects of each frontal sinus as well as mucosal thickening in the right sphenoid sinus. IMPRESSION: Atrophy with patchy periventricular small vessel disease. No intracranial mass, hemorrhage, or acute appearing infarct. Paranasal sinus disease. Note that there is bony expansion and destruction along the medial left maxillary antrum with soft tissue opacity extending throughout the left maxillary antrum and into the left naris. Question mucocele versus neoplasm arising from the left maxillary antrum. ENT consultation/ assessment in this regard advised. Electronically Signed   By: Bretta Bang III M.D.   On: 02/18/2015 13:11   Ct Maxillofacial Wo Cm  02/18/2015  CLINICAL DATA:  Severe altered mental status, purulent discharge from RIGHT eye. Dialysis patient, bed bound. Evaluate maxillary sinus mass. EXAM: CT MAXILLOFACIAL WITHOUT CONTRAST TECHNIQUE: Multidetector CT imaging of the maxillofacial structures was performed. Multiplanar CT image reconstructions were also generated. A small metallic BB was placed on the right temple in order to reliably differentiate right from left. COMPARISON:  CT head February 18, 2015 at 1305 hours FINDINGS: Mild motion degraded examination. Mildly expanded LEFT maxillary sinus associated with bony wall thickening, soft tissue opacifies the LEFT maxillary sinus with superimposed central density. RIGHT maxillary sinus air-fluid level mucosal thickening. Mild mucosal thickening and air-fluid level RIGHT sphenoid sinus. Mild ethmoid and frontal sinus mucosal thickening. Nasal septum mildly deviated to the RIGHT with a bony spur directed toward the LEFT. Soft tissue opacifies the bilateral ostiomeatal units, frontoethmoidal recesses and sphenoid ethmoidal recesses. No facial fracture.  Patient is edentulous. RIGHT periorbital soft tissue swelling. Bilateral facial subcutaneous fat stranding without focal  fluid collection by noncontrast CT. No subcutaneous gas or radiopaque foreign bodies. Status post LEFT ocular lens implant. Ocular globes and orbital contents are nonsuspicious. 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 Superimposed LEFT maxillary antral central density, this could represent inspissated mucus or fungal sinusitis without aggressive acute component though, MRI would be more sensitive for bony invasion. RIGHT periorbital soft tissue swelling suggests cellulitis without postseptal extent. Bilateral facial subcutaneous fat stranding could represent cellulitis or edema. Electronically Signed   By: Michel Santee.D.  On: 02/18/2015 23:49    CBC  Recent Labs Lab 02/18/15 1114 02/19/15 0705  WBC 18.5* 15.2*  HGB 12.6* 10.9*  HCT 38.8* 34.3*  PLT 261 265  MCV 85.1 84.1  MCH 27.6 26.7  MCHC 32.5 31.8  RDW 17.4* 17.3*  LYMPHSABS 1.5  --   MONOABS 1.5*  --   EOSABS 0.2  --   BASOSABS 0.0  --     Chemistries   Recent Labs Lab 02/18/15 1114 02/19/15 0705  NA 133* 132*  K 5.5* 4.8  CL 97* 95*  CO2 20* 23  GLUCOSE 80 77  BUN 53* 32*  CREATININE 7.78* 5.75*  CALCIUM 9.5 9.3  AST 43* 38  ALT 14* 14*  ALKPHOS 98 88  BILITOT 0.9 0.9   ------------------------------------------------------------------------------------------------------------------ estimated creatinine clearance is 17.8 mL/min (by C-G formula based on Cr of 5.75). ------------------------------------------------------------------------------------------------------------------ No results for input(s): HGBA1C in the last 72 hours. ------------------------------------------------------------------------------------------------------------------ No results for input(s): CHOL, HDL, LDLCALC, TRIG, CHOLHDL, LDLDIRECT in the last 72  hours. ------------------------------------------------------------------------------------------------------------------ No results for input(s): TSH, T4TOTAL, T3FREE, THYROIDAB in the last 72 hours.  Invalid input(s): FREET3 ------------------------------------------------------------------------------------------------------------------ No results for input(s): VITAMINB12, FOLATE, FERRITIN, TIBC, IRON, RETICCTPCT in the last 72 hours.  Coagulation profile No results for input(s): INR, PROTIME in the last 168 hours.  No results for input(s): DDIMER in the last 72 hours.  Cardiac Enzymes No results for input(s): CKMB, TROPONINI, MYOGLOBIN in the last 168 hours.  Invalid input(s): CK ------------------------------------------------------------------------------------------------------------------ Invalid input(s): POCBNP    Juanita Devincent D.O. on 02/19/2015 at 1:28 PM  Between 7am to 7pm - Pager - [phone]  After 7pm go to [email or web address] - password [email or web address]  And look for the night coverage person covering for me after hours  [facility] Office  [phone]

## 2015-02-19 NOTE — Progress Notes (Signed)
[facility] Rounding Note  Subjective: "Where am I"/co back leg pain Much more awake, alert, eating, says little recall of yesterday, "thought I had gone on to [place]"  Objective Vital signs in last 24 hours: Filed Vitals:   02/18/15 2145 02/18/15 2213 02/19/15 0500 02/19/15 0525  BP: 198/102 158/131  167/99  Pulse: 108 108  98  Temp:  98.2 F (36.8 C)  98.6 F (37 C)  TempSrc:  Oral  Oral  Resp: Height:      Weight:  114.5 kg (252 lb 6.8 oz) 114.5 kg (252 lb 6.8 oz)   SpO2:  94%  95%   Weight change:   Physical Exam: General:  More alert this am ,recognizing me BUT disoriented to place  Time does not remember admit/ but  NAD Heart: RRR , soft 1/6 sem , no rub or gallop Lungs: Clearer BS with faint L rales ,non labored breathing Abdomen: bs+, obese soft m NT, ND Extremities:  Some decr in pedal edema   Dialysis Access: pos bruit L fem avgg no change in swelling    OPDialysis Orders: Center: [facility] on MWF . EDW 111kg HD Bath 2k/3.5 Ca uf profile 4 Time 4.5 hr Heparin 0. Access L Leg AVG  Zemplar 0 mcg IV/HD / Mircera 75 mcg q 2 wks Last on 02/13/15 Units IV/HD Venofer   Other op labs = hgb 11.4 ca 10.3 phos 4.2 pth <7    Problem/Plan: Assessment/Plan 1. AMS multiple factors -   AMS improving after hd last pm /decr pain meds. WBC decreasing. Still with loose cough, and CXR "increasing basilar infiltrates" despite removal of 3.5 liters of volume with HD. Suspect PNA main issue. 2. Vol. Overload - HD yesterday  3.5 l uf. HD in am attempt further reduce volume  3. ?Hcap- antibiotics per primary service. BC's drawn  Wbc 18>15  Cxr  Worsening basilar infiltrates 4. ESRD - HD MWF k 4.8/ HD  In am  5. Hypertension/volume - uncontrolled on  admit . BP improving with hd uf   6. Anemia of ESRD- hgb >12> 10.9 this am / continue  esa F/u hgb , weekly iron (continue) 7. Metabolic bone disease - No vit d pth <7 , no binders. Need to  check phosphorus 8. Chronic back pain- pain meds / avoid too much meds    Lenny Pastel, PA-C [facility] Beeper [phone] 02/19/2015,8:41 AM  LOS: 1 day   I have seen and examined this patient and agree with plan and assessment in the above note with highlighted additions. MS markedly better, still with cough, CXR worsening basilar infiltrates (after 3.5 liters off yesterday - more likely infection that fluid). Continue ATB's, check labs preHD tomorrow, lower EDW.  Encouraging pt to stay on HD full tmt time.  Denea Cheaney B,MD 02/19/2015 1:49 PM  Labs: Basic Metabolic Panel:  Recent Labs Lab 02/18/15 1114 02/19/15 0705  NA 133* 132*  K 5.5* 4.8  CL 97* 95*  CO2 20* 23  GLUCOSE 80 77  BUN 53* 32*  CREATININE 7.78* 5.75*  CALCIUM 9.5 9.3   Liver Function Tests:  Recent Labs Lab 02/18/15 1114 02/19/15 0705  AST 43* 38  ALT 14* 14*  ALKPHOS 98 88  BILITOT 0.9 0.9  PROT 9.2* 7.5  ALBUMIN 3.0* 2.5*   No results for input(s): LIPASE, AMYLASE in the last 168 hours. No results for input(s): AMMONIA in the last 168 hours. CBC:  Recent Labs Lab  02/18/15 1114 02/19/15 0705  WBC 18.5* 15.2*  NEUTROABS 15.3*  --   HGB 12.6* 10.9*  HCT 38.8* 34.3*  MCV 85.1 84.1  PLT 261 265   Cardiac Enzymes: No results for input(s): CKTOTAL, CKMB, CKMBINDEX, TROPONINI in the last 168 hours. CBG:  Recent Labs Lab 02/18/15 1654 02/18/15 2205 02/18/15 2223 02/18/15 2335 02/19/15 0758  GLUCAP 73 68 74 94 85    Studies/Results: Dg Chest 1 View  02/18/2015  CLINICAL DATA:  Congestive heart failure EXAM: CHEST 1 VIEW COMPARISON:  August 16, 2014 FINDINGS: There is patchy airspace opacity in the right lower lobe. The lungs elsewhere clear. There is no appreciable edema. Heart is mildly enlarged with pulmonary vascular within normal limits. No adenopathy. There is postoperative change at the cervical -thoracic junction. IMPRESSION: Patchy opacity right base. Suspect  pneumonia as more likely than edema. Lungs elsewhere clear. Stable cardiac prominence. Followup PA and lateral chest radiograph recommended if possible in 3-4 weeks following trial of antibiotic therapy to ensure resolution and exclude underlying malignancy. Electronically Signed   By: Bretta Bang III M.D.   On: 02/18/2015 12:35   Dg Sacrum/coccyx  02/18/2015  CLINICAL DATA:  Pain EXAM: SACRUM AND COCCYX - 2+ VIEW COMPARISON:  Lumbar MRI August 16, 2014 FINDINGS: Frontal and lateral views obtained. There is no demonstrable fracture or diastases. No erosive change or bony destruction. There is osteoarthritic change in each sacroiliac joint. There is extensive arterial vascular calcification. There is marked narrowing at L5-S1, similar to prior MR examination. 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 No fracture or diastases. Extensive arterial vascular calcification present. Electronically Signed   By: Bretta Bang III M.D.   On: 02/18/2015 12:40   Ct Head Wo Contrast  02/18/2015  CLINICAL DATA:  Altered mental status EXAM: CT HEAD WITHOUT CONTRAST TECHNIQUE: Contiguous axial images were obtained from the base of the skull through the vertex without intravenous contrast. COMPARISON:  Head CT Sep 04, 2013 FINDINGS: There is moderate diffuse atrophy. There is no intracranial mass, hemorrhage, extra-axial fluid collection, or midline shift. There is moderate patchy small vessel disease throughout the centra semiovale bilaterally. No acute infarct is evident. The bony calvarium appears intact.  The mastoid air cells are clear. There is mucosal thickening in the right maxillary antrum. There is extensive opacification throughout the left maxillary antrum with areas containing calcification. There is apparent destruction expansion of the medial left maxillary antral wall with soft tissue opacity extending into and obstructing the left naris. There is also  extensive mucosal thickening throughout most of the ethmoid air cells. There is mucosal thickening in the inferior posterior aspects of each frontal sinus as well as mucosal thickening in the right sphenoid sinus. IMPRESSION: Atrophy with patchy periventricular small vessel disease. No intracranial mass, hemorrhage, or acute appearing infarct. Paranasal sinus disease. Note that there is bony expansion and destruction along the medial left maxillary antrum with soft tissue opacity extending throughout the left maxillary antrum and into the left naris. Question mucocele versus neoplasm arising from the left maxillary antrum. ENT consultation/ assessment in this regard advised. Electronically Signed   By: Bretta Bang III M.D.   On: 02/18/2015 13:11   Ct Maxillofacial Wo Cm  02/18/2015  CLINICAL DATA:  Severe altered mental status, purulent discharge from RIGHT eye. Dialysis patient, bed bound. Evaluate maxillary sinus mass. EXAM: CT MAXILLOFACIAL WITHOUT CONTRAST TECHNIQUE: Multidetector CT imaging of the maxillofacial structures was performed. Multiplanar CT image reconstructions  were also generated. A small metallic BB was placed on the right temple in order to reliably differentiate right from left. COMPARISON:  CT head February 18, 2015 at 1305 hours FINDINGS: Mild motion degraded examination. Mildly expanded LEFT maxillary sinus associated with bony wall thickening, soft tissue opacifies the LEFT maxillary sinus with superimposed central density. RIGHT maxillary sinus air-fluid level mucosal thickening. Mild mucosal thickening and air-fluid level RIGHT sphenoid sinus. Mild ethmoid and frontal sinus mucosal thickening. Nasal septum mildly deviated to the RIGHT with a bony spur directed toward the LEFT. Soft tissue opacifies the bilateral ostiomeatal units, frontoethmoidal recesses and sphenoid ethmoidal recesses. No facial fracture.  Patient is edentulous. RIGHT periorbital soft tissue swelling. Bilateral  facial subcutaneous fat stranding without focal fluid collection by noncontrast CT. No subcutaneous gas or radiopaque foreign bodies. Status post LEFT ocular lens implant. Ocular globes and orbital contents are nonsuspicious. 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 Superimposed LEFT maxillary antral central density, this could represent inspissated mucus or fungal sinusitis without aggressive acute component though, MRI would be more sensitive for bony invasion. RIGHT periorbital soft tissue swelling suggests cellulitis without postseptal extent. Bilateral facial subcutaneous fat stranding could represent cellulitis or edema. Electronically Signed   By: Awilda Metro M.D.   On: 02/18/2015 23:49   Medications:   . cefTAZidime (FORTAZ)  IV  2 g Intravenous Q M,W,F-HD  . dorzolamide-timolol  1 drop Both Eyes TID  . [START ON 02/20/2015] fentaNYL  75 mcg Transdermal Q72H  . [START ON 02/20/2015] ferric gluconate (FERRLECIT/NULECIT) IV  62.5 mg Intravenous Q Wed-HD  . insulin aspart  0-5 Units Subcutaneous QHS  . insulin aspart  0-9 Units Subcutaneous TID WC  . latanoprost  1 drop Both Eyes QHS  . sodium chloride  3 mL Intravenous Q12H  . sodium chloride  3 mL Intravenous Q12H  . sulindac  200 mg Oral BID  . [START ON 02/20/2015] vancomycin  1,000 mg Intravenous Q M,W,F-HD   sodium chloride, acetaminophen **OR** acetaminophen, HYDROmorphone (DILAUDID) injection, labetalol, lidocaine, lidocaine, lidocaine-EPINEPHrine, LORazepam, nitroGLYCERIN, ondansetron **OR** ondansetron (ZOFRAN) IV, oxymetazoline, silver nitrate applicators, sodium chloride, TRIPLE ANTIBIOTIC

## 2015-02-19 NOTE — Progress Notes (Signed)
Hypoglycemic Event  CBG: 68  Treatment: 15 GM carbohydrate snack  Symptoms: Shaky  Follow-up CBG: Time:22:27 CBG Result:74  Possible Reasons for Event: Inadequate meal intake  Comments/MD notified: Patient did not have dinner before dialysis. Dinner given to patient which helped improve blood sugar. Will continue to monitor.    Leim Fabry, Phuc Kluttz

## 2015-02-20 DIAGNOSIS — N186 End stage renal disease: Secondary | ICD-10-CM

## 2015-02-20 DIAGNOSIS — R7989 Other specified abnormal findings of blood chemistry: Secondary | ICD-10-CM

## 2015-02-20 DIAGNOSIS — G9340 Encephalopathy, unspecified: Secondary | ICD-10-CM

## 2015-02-20 DIAGNOSIS — Z992 Dependence on renal dialysis: Secondary | ICD-10-CM

## 2015-02-20 DIAGNOSIS — J3489 Other specified disorders of nose and nasal sinuses: Secondary | ICD-10-CM | POA: Insufficient documentation

## 2015-02-20 DIAGNOSIS — J189 Pneumonia, unspecified organism: Principal | ICD-10-CM

## 2015-02-20 DIAGNOSIS — R220 Localized swelling, mass and lump, head: Secondary | ICD-10-CM

## 2015-02-20 DIAGNOSIS — E875 Hyperkalemia: Secondary | ICD-10-CM

## 2015-02-20 LAB — BASIC METABOLIC PANEL
Anion gap: 13 (ref 5–15)
BUN: 36 mg/dL — AB (ref 6–20)
CHLORIDE: 93 mmol/L — AB (ref 101–111)
CO2: 26 mmol/L (ref 22–32)
CREATININE: 6.37 mg/dL — AB (ref 0.61–1.24)
Calcium: 9.4 mg/dL (ref 8.9–10.3)
GFR calc Af Amer: 10 mL/min — ABNORMAL LOW (ref >60)
GFR calc non Af Amer: 9 mL/min — ABNORMAL LOW (ref >60)
GLUCOSE: 90 mg/dL (ref 65–99)
POTASSIUM: 4.4 mmol/L (ref 3.5–5.1)
SODIUM: 132 mmol/L — AB (ref 135–145)

## 2015-02-20 LAB — RENAL FUNCTION PANEL
ALBUMIN: 2.4 g/dL — AB (ref 3.5–5.0)
Anion gap: 13 (ref 5–15)
BUN: 38 mg/dL — AB (ref 6–20)
CALCIUM: 9.1 mg/dL (ref 8.9–10.3)
CHLORIDE: 96 mmol/L — AB (ref 101–111)
CO2: 24 mmol/L (ref 22–32)
CREATININE: 6.65 mg/dL — AB (ref 0.61–1.24)
GFR calc Af Amer: 9 mL/min — ABNORMAL LOW (ref >60)
GFR, EST NON AFRICAN AMERICAN: 8 mL/min — AB (ref >60)
Glucose, Bld: 124 mg/dL — ABNORMAL HIGH (ref 65–99)
Phosphorus: 4.1 mg/dL (ref 2.5–4.6)
Potassium: 4.8 mmol/L (ref 3.5–5.1)
Sodium: 133 mmol/L — ABNORMAL LOW (ref 135–145)

## 2015-02-20 LAB — GLUCOSE, CAPILLARY
GLUCOSE-CAPILLARY: 88 mg/dL (ref 65–99)
Glucose-Capillary: 105 mg/dL — ABNORMAL HIGH (ref 65–99)
Glucose-Capillary: 110 mg/dL — ABNORMAL HIGH (ref 65–99)

## 2015-02-20 LAB — CBC
HCT: 35.6 % — ABNORMAL LOW (ref 39.0–52.0)
HEMATOCRIT: 35.9 % — AB (ref 39.0–52.0)
HEMOGLOBIN: 11.4 g/dL — AB (ref 13.0–17.0)
Hemoglobin: 11.3 g/dL — ABNORMAL LOW (ref 13.0–17.0)
MCH: 27.5 pg (ref 26.0–34.0)
MCH: 27.4 pg (ref 26.0–34.0)
MCHC: 31.8 g/dL (ref 30.0–36.0)
MCHC: 31.7 g/dL (ref 30.0–36.0)
MCV: 86.5 fL (ref 78.0–100.0)
MCV: 86.4 fL (ref 78.0–100.0)
PLATELETS: 283 10*3/uL (ref 150–400)
Platelets: 273 10*3/uL (ref 150–400)
RBC: 4.15 MIL/uL — ABNORMAL LOW (ref 4.22–5.81)
RBC: 4.12 MIL/uL — ABNORMAL LOW (ref 4.22–5.81)
RDW: 17.5 % — ABNORMAL HIGH (ref 11.5–15.5)
RDW: 17.3 % — ABNORMAL HIGH (ref 11.5–15.5)
WBC: 13.2 10*3/uL — AB (ref 4.0–10.5)
WBC: 12.4 10*3/uL — AB (ref 4.0–10.5)

## 2015-02-20 MED ORDER — DARBEPOETIN ALFA 40 MCG/0.4ML IJ SOSY
PREFILLED_SYRINGE | INTRAMUSCULAR | Status: AC
  Filled 2015-02-20: qty 0.4

## 2015-02-20 MED ORDER — RENA-VITE PO TABS
1.0000 | ORAL_TABLET | Freq: Every day | ORAL | Status: DC
  Administered 2015-02-20 – 2015-02-21 (×2): 1 via ORAL
  Filled 2015-02-20: qty 1

## 2015-02-20 MED ORDER — DARBEPOETIN ALFA 40 MCG/0.4ML IJ SOSY
40.0000 ug | PREFILLED_SYRINGE | INTRAMUSCULAR | Status: DC
Start: 2015-02-20 — End: 2015-02-22
  Administered 2015-02-20: 40 ug via INTRAVENOUS
  Filled 2015-02-20: qty 0.4

## 2015-02-20 NOTE — Progress Notes (Signed)
Patient ID: [name], male   DOB: 1955/11/05, 59 y.o.   MRN: [id number]   Follow up on consultation:  He is still not able to provide any meaningful history. I spoke to his mother on the phone. She is not aware of his having any nasal symptoms in the past. This chronic sinusitis which has been present for at least several years is not contributing to his current medical problems and can be worked up more completely as an out patient.

## 2015-02-20 NOTE — Progress Notes (Signed)
PT Cancellation Note  Patient Details Name: [name] MRN: [id number] DOB: 31-Mar-1956 Today's Date: 02/19/2015   Patient remains in dialysis at this time. Will follow up tomorrow for continued PT.   [address] [place], [place] [phone]  02/20/2015 1626

## 2015-02-20 NOTE — Progress Notes (Addendum)
PROGRESS NOTE  [name] FBP:[id number] DOB: 1956/04/25 DOA: 02/18/2015 PCP: Dyke Maes, MD  Brief History 59 y.o. male with a past medical history of CHF, end-stage renal disease on hemodialysis Monday Wednesday Friday, diabetes diet controlled, hypertension presents to the emergency from dialysis with the chief complaint of acute altered mental status and of low back pain. Initial evaluation in the emergency department includes a chest x-ray concerning for pneumonia, uncontrolled hypertension, leukocytosis. Information is obtained from the chart and the patient however information from patient may be somewhat reliable secondary to acute encephalopathy. According to the dialysis staff at his dialysis center, the patient looked weaker than usual and complaint of worsening back pain. The patient was noted to be more confused and started coughing up brown sputum. The patient also has had a history of signing off dialysis early. The patient has not had a full treatment since 12/14/2014.  The patient had been using oxycodone 10 mg preHD, 5 mg TID prn, baclofen 5mg  bid prn, ativan 0.5 mg q 8 hours prn and fentanyl Assessment/Plan: Acute encephalopathy -Likely multifactorial including uremia from missed dialysis, pneumonia, opioids/benzo and hypertensive encephalopathy (BP 217/114) -CT head negative for hemorrhage or acute infarct -CT maxillofacial shows acute on chronic pan paranasal sinusitis, possible mucocele left maxillary sinus, superimposed left maxillary antral central density, right. Orbital soft tissue swelling suggestive of cellulitis -overall improved -Minimize opioids  Left maxillary mucocele -Noted on CT Maxillofacial -ENT consulted and appreciated--likely related to chronic sinusitis-->outpt workup  HCAP (healthcare-associated pneumonia)  -Patient lives at a rehabilitation facility as well as 3 times a week dialysis.  -Continue vancomycin and fortaz D#3 -WBC  improving, currently afebrile -Blood cultures--negative to date  CAD (coronary artery disease)/ Elevated troponin  -Denies chest pain. Troponin only mildly elevated and close to his baseline range. -hx of inferior STEMI with LAD stent Jan 2015-->restart ASA and plavix  HTN (hypertension)  -Patient is not on any home antihypertensive meds  -Continue labetalol as needed   End-stage renal disease on hemodialysis (HCC)  -Dialyzes Monday, Wednesday, Friday -pt frequently signs off early -Has not had full HD session since 12/14/14 -Nephrology consult appreciated   Hyperkalemia  -Resolved, Potassium level 5.5 on admission.  -Continue to monitor BMP  Normocytic, normochromic anemia  -Hemoglobin currently 10.9, above baseline of approximately 7-8  Diabetes mellitus, type 2  -Not on any home medication  -Continue and complaint field CBG monitoring -Pending hemoglobin A1c  Chronic back pain  -Has a history of chronic low back pain as well as bilateral feet pain.  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 The lungs elsewhere clear. There is no appreciable edema. Heart is mildly enlarged with pulmonary vascular within normal limits. No adenopathy. There is postoperative change at the cervical -thoracic junction. IMPRESSION: Patchy opacity right base. Suspect pneumonia as more likely than  edema. Lungs elsewhere clear. Stable cardiac prominence.  Followup PA and lateral chest radiograph recommended if possible in 3-4 weeks following trial of antibiotic therapy to ensure resolution and exclude underlying malignancy. Electronically Signed   By: Bretta Bang III M.D.   On: 02/18/2015 12:35   Dg Chest 2 View  02/19/2015  CLINICAL DATA:  Cough and wheezing EXAM: CHEST - 2 VIEW COMPARISON:  02/18/2015 FINDINGS: Cardiac shadow remains enlarged. Increasing bibasilar infiltrates are noted when compare with the prior exam. Degenerative changes of the thoracic spine are noted. IMPRESSION: Increasing bibasilar infiltrates. Electronically Signed   By: Alcide Clever M.D.   On: 02/19/2015 11:06   Dg Sacrum/coccyx  02/18/2015  CLINICAL DATA:  Pain EXAM: SACRUM AND COCCYX - 2+ VIEW COMPARISON:  Lumbar MRI August 16, 2014 FINDINGS: Frontal and lateral views obtained. There is no demonstrable fracture or diastases. No erosive change or bony destruction. There is osteoarthritic change in each sacroiliac joint. There is extensive arterial vascular calcification. There is marked narrowing at L5-S1, similar to prior MR examination. 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 No fracture or diastases. Extensive arterial vascular calcification present. Electronically Signed   By: Bretta Bang III M.D.   On: 02/18/2015 12:40   Ct Head Wo Contrast  02/18/2015  CLINICAL DATA:  Altered mental status EXAM: CT HEAD WITHOUT CONTRAST TECHNIQUE: Contiguous axial images were obtained from the base of the skull through the vertex without intravenous contrast. COMPARISON:  Head CT Sep 04, 2013 FINDINGS: There is moderate diffuse atrophy. There is no intracranial mass, hemorrhage, extra-axial fluid collection, or midline shift. There is moderate patchy small vessel disease throughout the centra semiovale bilaterally. No acute infarct is evident. The bony calvarium appears  intact.  The mastoid air cells are clear. There is mucosal thickening in the right maxillary antrum. There is extensive opacification throughout the left maxillary antrum with areas containing calcification. There is apparent destruction expansion of the medial left maxillary antral wall with soft tissue opacity extending into and obstructing the left naris. There is also extensive mucosal thickening throughout most of the ethmoid air cells. There is mucosal thickening in the inferior posterior aspects of each frontal sinus as well as mucosal thickening in the right sphenoid sinus. IMPRESSION: Atrophy with patchy periventricular small vessel disease. No intracranial mass, hemorrhage, or acute appearing infarct. Paranasal sinus disease. Note that there is bony expansion and destruction along the medial left maxillary antrum with soft tissue opacity extending throughout the left maxillary antrum and into the left naris. Question mucocele versus neoplasm arising from the left maxillary antrum. ENT consultation/ assessment in this regard advised. Electronically Signed   By: Bretta Bang III M.D.   On: 02/18/2015 13:11   Ct Maxillofacial Wo Cm  02/18/2015  CLINICAL DATA:  Severe altered mental status, purulent discharge from RIGHT eye. Dialysis patient, bed bound. Evaluate maxillary sinus mass. EXAM: CT MAXILLOFACIAL WITHOUT CONTRAST TECHNIQUE: Multidetector CT imaging of the maxillofacial structures was performed. Multiplanar CT image reconstructions were also generated. A small metallic BB was placed on the right temple in order to reliably differentiate right from left. COMPARISON:  CT head February 18, 2015 at 1305 hours FINDINGS: Mild motion degraded examination. Mildly expanded LEFT maxillary sinus associated with bony wall thickening, soft tissue opacifies the LEFT maxillary sinus with superimposed central density. RIGHT maxillary sinus air-fluid level mucosal thickening. Mild mucosal thickening and  air-fluid level RIGHT sphenoid sinus. Mild ethmoid and frontal sinus mucosal thickening. Nasal septum mildly deviated to the RIGHT with a  bony spur directed toward the LEFT. Soft tissue opacifies the bilateral ostiomeatal units, frontoethmoidal recesses and sphenoid ethmoidal recesses. No facial fracture.  Patient is edentulous. RIGHT periorbital soft tissue swelling. Bilateral facial subcutaneous fat stranding without focal fluid collection by noncontrast CT. No subcutaneous gas or radiopaque foreign bodies. Status post LEFT ocular lens implant. Ocular globes and orbital contents are nonsuspicious. 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 Superimposed LEFT maxillary antral central density, this could represent inspissated mucus or fungal sinusitis without aggressive acute component though, MRI would be more sensitive for bony invasion. RIGHT periorbital soft tissue swelling suggests cellulitis without postseptal extent. Bilateral facial subcutaneous fat stranding could represent cellulitis or edema. Electronically Signed   By: Awilda Metro M.D.   On: 02/18/2015 23:49         Subjective: Patient complains of low back pain. Denies any fevers, chills, chest pain, shortness breath, nausea, vomiting, diarrhea, abdominal pain. Denies any headache or visual disturbance.  Objective: Filed Vitals:   02/20/15 1500 02/20/15 1530 02/20/15 1600 02/20/15 1630  BP: 135/72 130/74 118/95 145/81  Pulse: 68 76 68 71  Temp:      TempSrc:      Resp: 14 20    Height:      Weight:      SpO2:        Intake/Output Summary (Last 24 hours) at 02/20/15 1646 Last data filed at 02/20/15 1300  Gross per 24 hour  Intake    600 ml  Output      0 ml  Net    600 ml   Weight change: 0.8 kg (1 lb 12.2 oz) Exam:   General:  Pt is alert, follows commands appropriately, not in acute distress  HEENT: No icterus, No thrush, No neck mass,  [place]/AT  Cardiovascular: RRR, S1/S2, no rubs, no gallops  Respiratory: Bibasilar rales. Good air movement. No wheezing  Abdomen: Soft/+BS, non tender, non distended, no guarding  Extremities: 1 +LE edema, No lymphangitis, No petechiae, No rashes, no synovitis  Data Reviewed: Basic Metabolic Panel:  Recent Labs Lab 02/18/15 1114 02/19/15 0705 02/20/15 0418 02/20/15 1325  NA 133* 132* 132* 133*  K 5.5* 4.8 4.4 4.8  CL 97* 95* 93* 96*  CO2 20* GLUCOSE 80 77 90 124*  BUN 53* 32* 36* 38*  CREATININE 7.78* 5.75* 6.37* 6.65*  CALCIUM 9.5 9.3 9.4 9.1  PHOS  --   --   --  4.1   Liver Function Tests:  Recent Labs Lab 02/18/15 1114 02/19/15 0705 02/20/15 1325  AST 43* 38  --   ALT 14* 14*  --   ALKPHOS 98 88  --   BILITOT 0.9 0.9  --   PROT 9.2* 7.5  --   ALBUMIN 3.0* 2.5* 2.4*   No results for input(s): LIPASE, AMYLASE in the last 168 hours. No results for input(s): AMMONIA in the last 168 hours. CBC:  Recent Labs Lab 02/18/15 1114 02/19/15 0705 02/20/15 0418 02/20/15 1325  WBC 18.5* 15.2* 13.2* 12.4*  NEUTROABS 15.3*  --   --   --   HGB 12.6* 10.9* 11.4* 11.3*  HCT 38.8* 34.3* 35.9* 35.6*  MCV 85.1 84.1 86.5 86.4  PLT 261 265 273 283   Cardiac Enzymes: No results for input(s): CKTOTAL, CKMB, CKMBINDEX, TROPONINI in the last 168 hours. BNP: Invalid input(s): POCBNP CBG:  Recent Labs Lab 02/19/15 1142 02/19/15 1716 02/19/15 2034 02/20/15 1610  02/20/15 1127  GLUCAP 74 94 78 88 105*    Recent Results (from the past 240 hour(s))  Blood culture (routine x 2)     Status: None (Preliminary result)   Collection Time: 02/18/15  1:57 PM  Result Value Ref Range Status   Specimen Description BLOOD RIGHT FOREARM  Final   Special Requests BOTTLES DRAWN AEROBIC AND ANAEROBIC  Final   Culture NO GROWTH 2 DAYS  Final   Report Status PENDING  Incomplete  Culture, blood (routine x 2) Call MD if unable to obtain prior to antibiotics being given      Status: None (Preliminary result)   Collection Time: 02/18/15  5:30 PM  Result Value Ref Range Status   Specimen Description BLOOD LEFT LEG  Final   Special Requests BOTTLES DRAWN AEROBIC AND ANAEROBIC  Final   Culture NO GROWTH 2 DAYS  Final   Report Status PENDING  Incomplete  MRSA PCR Screening     Status: None   Collection Time: 02/18/15 10:47 PM  Result Value Ref Range Status   MRSA by PCR NEGATIVE NEGATIVE Final    Comment:        The GeneXpert MRSA Assay (FDA approved for NASAL specimens only), is one component of a comprehensive MRSA colonization surveillance program. It is not intended to diagnose MRSA infection nor to guide or monitor treatment for MRSA infections.      Scheduled Meds: . cefTAZidime (FORTAZ)  IV  2 g Intravenous Q M,W,F-HD  . darbepoetin (ARANESP) injection - DIALYSIS  40 mcg Intravenous Q Wed-HD  . dorzolamide-timolol  1 drop Both Eyes TID  . fentaNYL  75 mcg Transdermal Q72H  . ferric gluconate (FERRLECIT/NULECIT) IV  62.5 mg Intravenous Q Wed-HD  . insulin aspart  0-5 Units Subcutaneous QHS  . insulin aspart  0-9 Units Subcutaneous TID WC  . latanoprost  1 drop Both Eyes QHS  . multivitamin  1 tablet Oral QHS  . sodium chloride  3 mL Intravenous Q12H  . sodium chloride  3 mL Intravenous Q12H  . sulindac  200 mg Oral BID  . vancomycin  1,000 mg Intravenous Q M,W,F-HD   Continuous Infusions:    Torris House, DO  [facility] Pager [phone]  If 7PM-7AM, please contact night-coverage [email or web address] Password [email or web address] 02/20/2015, 4:46 PM   LOS: 2 days

## 2015-02-20 NOTE — Progress Notes (Signed)
Subjective:  "feels like went through hell" ,more alert today also , for hd today  Objective Vital signs in last 24 hours: Filed Vitals:   02/19/15 0525 02/19/15 0856 02/19/15 2035 02/20/15 0435  BP: 167/99 110/54 116/68   Pulse: 98 85 77   Temp: 98.6 F (37 C) 99.2 F (37.3 C) 98.6 F (37 C)   TempSrc: Oral Oral Oral   Resp: 19  20   Height:      Weight:   114.8 kg (253 lb 1.4 oz) 114.8 kg (253 lb 1.4 oz)  SpO2: 95% 85% 95%    Weight change: 0.8 kg (1 lb 12.2 oz)  Physical Exam: General:Alert OX3  NAD Heart: RRR , soft 1/6 sem , no rub or gallop Lungs:L sided rhonchi and crackles  ,non labored breathing Abdomen: bs+, obese soft m NT, ND Extremities: Some decr in pedal edema  Dialysis Access: pos bruit L fem avgg no change in swelling   OPDialysis Orders: Center: [facility] on MWF . EDW 111kg HD Bath 2k/3.5 Ca uf profile 4 Time 4.5 hr Heparin 0. Access L Leg AVG  Zemplar 0 mcg IV/HD / Mircera 75 mcg q 2 wks Last on 02/13/15 Units IV/HD Venofer 100mg   Other op labs = hgb 11.4 ca 10.3 phos 4.2 pth <7    Problem/Plan:  1. AMS multifactorial - AMS improving after one full hd tx on admit plus antibiotics for PNA  (and reduction in CNS active/pain medications)      2. Volume overload - HD on admit 3.5 l uf. HD today  attempt further reduce volume  3. HCAP - antibiotics per primary service. BC's drawn Wbc 18>15 >13/ yesterday Still has loose sounding cough, and  yesterday am CXR "increasing basilar infiltrates" (despite 3.5 liters fluid off with HD) 4. ESRD - HD MWF For HD today 5. Hypertension/volume -  116/68   BP improving with dialysis/fluid removal  6. Anemia of ESRD- hgb >12> 10.9 >11.9 this am /  On  esa(Aranesp) 100 decr to 40/week  F/u hgb, weekly iron (continue) 7. Metabolic bone disease - No vit d pth <7 , no binders.labs pend hd today  Need to check phosphorus 8. Chronic back pain- pain meds / avoid too much meds 9. Left maxillary  mucocele - ENT evaluating  Lenny Pastel, PA-C [facility] [phone] 02/20/2015,8:16 AM  LOS: 2 days   I have seen and examined this patient and agree with plan and assessment in the above note with highlighted additions. Continues to look better clinically, with improvement in MS, WBC. On [place] and [place] for HCAP.Marland Kitchen Removing volume with HD and hopeful can get him to stay on full treatment time.  Donnell Beauchamp B,MD 02/20/2015 12:26 PM    Recent Labs Lab 02/18/15 1114 02/19/15 0705 02/20/15 0418  NA 133* 132* 132*  K 5.5* 4.8 4.4  CL 97* 95* 93*  CO2 20* 23 26  GLUCOSE 80 77 90  BUN 53* 32* 36*  CREATININE 7.78* 5.75* 6.37*  CALCIUM 9.5 9.3 9.4     Recent Labs Lab 02/18/15 1114 02/19/15 0705  AST 43* 38  ALT 14* 14*  ALKPHOS 98 88  BILITOT 0.9 0.9  PROT 9.2* 7.5  ALBUMIN 3.0* 2.5*     Recent Labs Lab 02/18/15 1114 02/19/15 0705 02/20/15 0418  WBC 18.5* 15.2* 13.2*  NEUTROABS 15.3*  --   --   HGB 12.6* 10.9* 11.4*  HCT 38.8* 34.3* 35.9*  MCV 85.1 84.1 86.5  PLT 261  265 273     Recent Labs Lab 02/19/15 0758 02/19/15 1142 02/19/15 1716 02/19/15 2034 02/20/15 0751  GLUCAP 85 74 94 78 88    Studies/Results: Dg Chest 1 View  02/18/2015  CLINICAL DATA:  Congestive heart failure EXAM: CHEST 1 VIEW COMPARISON:  August 16, 2014 FINDINGS: There is patchy airspace opacity in the right lower lobe. The lungs elsewhere clear. There is no appreciable edema. Heart is mildly enlarged with pulmonary vascular within normal limits. No adenopathy. There is postoperative change at the cervical -thoracic junction. IMPRESSION: Patchy opacity right base. Suspect pneumonia as more likely than edema. Lungs elsewhere clear. Stable cardiac prominence. Followup PA and lateral chest radiograph recommended if possible in 3-4 weeks following trial of antibiotic therapy to ensure resolution and exclude underlying malignancy. Electronically Signed   By: Bretta Bang III M.D.   On: 02/18/2015 12:35   Dg Chest 2 View  02/19/2015  CLINICAL DATA:  Cough and wheezing EXAM: CHEST - 2 VIEW COMPARISON:  02/18/2015 FINDINGS: Cardiac shadow remains enlarged. Increasing bibasilar infiltrates are noted when compare with the prior exam. Degenerative changes of the thoracic spine are noted. IMPRESSION: Increasing bibasilar infiltrates. Electronically Signed   By: Alcide Clever M.D.   On: 02/19/2015 11:06   Dg Sacrum/coccyx  02/18/2015  CLINICAL DATA:  Pain EXAM: SACRUM AND COCCYX - 2+ VIEW COMPARISON:  Lumbar MRI August 16, 2014 FINDINGS: Frontal and lateral views obtained. There is no demonstrable fracture or diastases. No erosive change or bony destruction. There is osteoarthritic change in each sacroiliac joint. There is extensive arterial vascular calcification. There is marked narrowing at L5-S1, similar to prior MR examination. 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 No fracture or diastases. Extensive arterial vascular calcification present. Electronically Signed   By: Bretta Bang III M.D.   On: 02/18/2015 12:40   Ct Head Wo Contrast  02/18/2015  CLINICAL DATA:  Altered mental status EXAM: CT HEAD WITHOUT CONTRAST TECHNIQUE: Contiguous axial images were obtained from the base of the skull through the vertex without intravenous contrast. COMPARISON:  Head CT Sep 04, 2013 FINDINGS: There is moderate diffuse atrophy. There is no intracranial mass, hemorrhage, extra-axial fluid collection, or midline shift. There is moderate patchy small vessel disease throughout the centra semiovale bilaterally. No acute infarct is evident. The bony calvarium appears intact.  The mastoid air cells are clear. There is mucosal thickening in the right maxillary antrum. There is extensive opacification throughout the left maxillary antrum with areas containing calcification. There is apparent destruction expansion of the medial left  maxillary antral wall with soft tissue opacity extending into and obstructing the left naris. There is also extensive mucosal thickening throughout most of the ethmoid air cells. There is mucosal thickening in the inferior posterior aspects of each frontal sinus as well as mucosal thickening in the right sphenoid sinus. IMPRESSION: Atrophy with patchy periventricular small vessel disease. No intracranial mass, hemorrhage, or acute appearing infarct. Paranasal sinus disease. Note that there is bony expansion and destruction along the medial left maxillary antrum with soft tissue opacity extending throughout the left maxillary antrum and into the left naris. Question mucocele versus neoplasm arising from the left maxillary antrum. ENT consultation/ assessment in this regard advised. Electronically Signed   By: Bretta Bang III M.D.   On: 02/18/2015 13:11   Ct Maxillofacial Wo Cm  02/18/2015  CLINICAL DATA:  Severe altered mental status, purulent discharge from RIGHT eye. Dialysis patient, bed bound. Evaluate  maxillary sinus mass. EXAM: CT MAXILLOFACIAL WITHOUT CONTRAST TECHNIQUE: Multidetector CT imaging of the maxillofacial structures was performed. Multiplanar CT image reconstructions were also generated. A small metallic BB was placed on the right temple in order to reliably differentiate right from left. COMPARISON:  CT head February 18, 2015 at 1305 hours FINDINGS: Mild motion degraded examination. Mildly expanded LEFT maxillary sinus associated with bony wall thickening, soft tissue opacifies the LEFT maxillary sinus with superimposed central density. RIGHT maxillary sinus air-fluid level mucosal thickening. Mild mucosal thickening and air-fluid level RIGHT sphenoid sinus. Mild ethmoid and frontal sinus mucosal thickening. Nasal septum mildly deviated to the RIGHT with a bony spur directed toward the LEFT. Soft tissue opacifies the bilateral ostiomeatal units, frontoethmoidal recesses and sphenoid  ethmoidal recesses. No facial fracture.  Patient is edentulous. RIGHT periorbital soft tissue swelling. Bilateral facial subcutaneous fat stranding without focal fluid collection by noncontrast CT. No subcutaneous gas or radiopaque foreign bodies. Status post LEFT ocular lens implant. Ocular globes and orbital contents are nonsuspicious. 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 Superimposed LEFT maxillary antral central density, this could represent inspissated mucus or fungal sinusitis without aggressive acute component though, MRI would be more sensitive for bony invasion. RIGHT periorbital soft tissue swelling suggests cellulitis without postseptal extent. Bilateral facial subcutaneous fat stranding could represent cellulitis or edema. Electronically Signed   By: Awilda Metro M.D.   On: 02/18/2015 23:49   Medications:   . cefTAZidime (FORTAZ)  IV  2 g Intravenous Q M,W,F-HD  . darbepoetin (ARANESP) injection - DIALYSIS  100 mcg Intravenous Q Wed-HD  . dorzolamide-timolol  1 drop Both Eyes TID  . fentaNYL  75 mcg Transdermal Q72H  . ferric gluconate (FERRLECIT/NULECIT) IV  62.5 mg Intravenous Q Wed-HD  . insulin aspart  0-5 Units Subcutaneous QHS  . insulin aspart  0-9 Units Subcutaneous TID WC  . latanoprost  1 drop Both Eyes QHS  . sodium chloride  3 mL Intravenous Q12H  . sodium chloride  3 mL Intravenous Q12H  . sulindac  200 mg Oral BID  . vancomycin  1,000 mg Intravenous Q M,W,F-HD

## 2015-02-20 NOTE — Procedures (Signed)
I have personally attended this patient's dialysis session.   UF goal 4 liters 130/74 112.8 pre HD with EDW 111 Hopeful that we can lower his EDW and alleviate some of his marked edema (that has in part accumulated as a result of his failure to run full treatments as outpt)   Camille Bal, MD [facility] [phone] Pager 02/20/2015, 3:50 PM

## 2015-02-21 DIAGNOSIS — Z720 Tobacco use: Secondary | ICD-10-CM

## 2015-02-21 LAB — GLUCOSE, CAPILLARY
GLUCOSE-CAPILLARY: 69 mg/dL (ref 65–99)
GLUCOSE-CAPILLARY: 90 mg/dL (ref 65–99)
Glucose-Capillary: 96 mg/dL (ref 65–99)
Glucose-Capillary: 93 mg/dL (ref 65–99)

## 2015-02-21 LAB — CBC
HEMATOCRIT: 35.7 % — AB (ref 39.0–52.0)
Hemoglobin: 11.3 g/dL — ABNORMAL LOW (ref 13.0–17.0)
MCH: 27.3 pg (ref 26.0–34.0)
MCHC: 31.7 g/dL (ref 30.0–36.0)
MCV: 86.2 fL (ref 78.0–100.0)
Platelets: 244 10*3/uL (ref 150–400)
RBC: 4.14 MIL/uL — AB (ref 4.22–5.81)
RDW: 17.5 % — ABNORMAL HIGH (ref 11.5–15.5)
WBC: 11.7 10*3/uL — AB (ref 4.0–10.5)

## 2015-02-21 LAB — HEMOGLOBIN A1C
Hgb A1c MFr Bld: 5.6 % (ref 4.8–5.6)
MEAN PLASMA GLUCOSE: 114 mg/dL

## 2015-02-21 MED ORDER — CLOPIDOGREL BISULFATE 75 MG PO TABS
75.0000 mg | ORAL_TABLET | Freq: Every day | ORAL | Status: DC
  Administered 2015-02-22: 75 mg via ORAL
  Filled 2015-02-21: qty 1

## 2015-02-21 MED ORDER — ASPIRIN 81 MG PO CHEW
81.0000 mg | CHEWABLE_TABLET | Freq: Every day | ORAL | Status: DC
  Administered 2015-02-21 – 2015-02-22 (×2): 81 mg via ORAL
  Filled 2015-02-21 (×2): qty 1

## 2015-02-21 MED ORDER — RENA-VITE PO TABS: 1.0000 | ORAL_TABLET | Freq: Every day | ORAL | Status: AC

## 2015-02-21 NOTE — Discharge Summary (Signed)
Physician Discharge Summary  [name]:[id number] DOB: [birth year] DOA: 02/18/2015  PCP: Dyke Maes, MD  Admit date: 02/18/2015 Discharge date: 02/22/15 Recommendations for Outpatient Follow-up:  1. Pt will need to follow up with PCP in 2 weeks post discharge   Discharge Diagnoses:  Acute encephalopathy -multifactorial including uremia from missed dialysis, pneumonia, opioids/benzo and hypertensive encephalopathy (BP 217/114) -CT head negative for hemorrhage or acute infarct -CT maxillofacial shows acute on chronic pan paranasal sinusitis, possible mucocele left maxillary sinus, superimposed left maxillary antral central density, right. Orbital soft tissue swelling suggestive of cellulitis -overall improved -Minimize opioids -Discontinue Ativan, baclofen, oxycodone -He will continue on his home dose of transdermal fentanyl 75 g every 72 hours  Left maxillary mucocele -Noted on CT Maxillofacial -ENT consulted and appreciated--likely related to chronic sinusitis-->outpt workup  HCAP (healthcare-associated pneumonia)  -Patient lives at a rehabilitation facility as well as 3 times a week dialysis.  -Continue vancomycin and fortaz D#5 -last dose of abx on HD on 10/28 which will last through 7 days due to ESRD -WBC improving, currently afebrile -Blood cultures--negative to date  CAD (coronary artery disease)/ Elevated troponin  -Denies chest pain. Troponin only mildly elevated and close to his baseline range. -hx of inferior STEMI with LAD stent Jan 2015-->restart ASA and plavix  HTN (hypertension)  -Patient is not on any home antihypertensive meds  -Continue labetalol as needed   End-stage renal disease on hemodialysis (HCC)  -Dialyzes Monday, Wednesday, Friday -pt frequently signs off early -Has not had full HD session since 12/14/14 -Nephrology consult appreciated -d/c bicarb   Hyperkalemia  -Resolved, Potassium level 5.5 on admission.   -Continue to monitor BMP  Normocytic, normochromic anemia  -Hemoglobin currently 10.9, above previous baseline of approximately 7-8  Diabetes mellitus, type 2--diet controlled  -Not on any home medication  -hemoglobin A1c--5.6 -d/c CBG checks and SSI  Chronic back pain  -Has a history of chronic low back pain as well as bilateral feet pain.  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 male with a past medical history of CHF, end-stage renal disease on hemodialysis Monday Wednesday Friday, diabetes diet controlled, hypertension presents to the emergency from dialysis with the chief complaint of acute altered mental status and of low back pain. Initial evaluation in the emergency department includes a chest x-ray concerning for pneumonia, uncontrolled hypertension, leukocytosis. Information is obtained from the chart and the patient however information from patient may be somewhat reliable secondary to acute encephalopathy. According to the dialysis staff at his dialysis center, the patient looked weaker than usual and complaint of worsening back pain. The patient was noted to be more confused and started coughing up brown sputum. The patient also has had a history of signing off dialysis early. The patient has not had a full treatment since 12/14/2014. The patient had been using oxycodone 10 mg preHD, 5 mg TID prn, baclofen  bid prn, ativan 0.5 mg q 8 hours prn and  fentanyl  Consultants: nephrology  Discharge Exam: Filed  Vitals:   02/22/15 1030  BP: 130/76  Pulse: 62  Temp:   Resp:    Filed Vitals:   02/22/15 0943 02/22/15 0946 02/22/15 1000 02/22/15 1030  BP: 135/63 135/69 113/66 130/76  Pulse: 68 67 89 62  Temp:      TempSrc:      Resp:      Height:      Weight:      SpO2:       General: Awake and alert, NAD, pleasant, cooperative Cardiovascular: RRR, no rub, no gallop, no S3 Respiratory: Bibasilar rales, no wheezing Abdomen:soft, nontender, nondistended, positive bowel sounds Extremities: trace LE edema, No lymphangitis, no petechiae  Discharge Instructions     Medication List    STOP taking these medications        b complex vitamins tablet     baclofen 10 MG tablet  Commonly known as:  LIORESAL     famotidine 20 MG tablet  Commonly known as:  PEPCID     loperamide 2 MG tablet  Commonly known as:  IMODIUM A-D     LORazepam 0.5 MG tablet  Commonly known as:  ATIVAN     oxyCODONE 5 MG immediate release tablet  Commonly known as:  Oxy IR/ROXICODONE     sodium bicarbonate 650 MG tablet      TAKE these medications        aspirin EC 81 MG tablet  Take 1 tablet (81 mg total) by mouth daily.     atorvastatin 80 MG tablet  Commonly known as:  LIPITOR  Take 1 tablet (80 mg total) by mouth daily at 6 PM.     B-complex with vitamin C tablet  Take 1 tablet by mouth at bedtime.     calcium carbonate 500 MG chewable tablet  Commonly known as:  TUMS - dosed in mg elemental calcium  Chew 3 tablets by mouth 3 (three) times daily.     clopidogrel 75 MG tablet  Commonly known as:  PLAVIX  Take 1 tablet (75 mg total) by mouth daily.     colchicine 0.6 MG tablet  Take 0.6 mg by mouth every 12 (twelve) hours as needed (gout).     dorzolamide-timolol 22.3-6.8 MG/ML ophthalmic solution  Commonly known as:  COSOPT  Place 1 drop into both eyes 3 (three) times daily.     escitalopram 10 MG tablet  Commonly known as:   LEXAPRO  Take 10 mg by mouth daily.     feeding supplement (PRO-STAT SUGAR FREE 64) Liqd  Take 30 mLs by mouth 2 (two) times daily.     fentaNYL 50 MCG/HR  Commonly known as:  DURAGESIC - dosed mcg/hr  Apply one patch topically every 72 hours for pain. Remove old patch     gabapentin 100 MG capsule  Commonly known as:  NEURONTIN  Take 100 mg by mouth every 12 (twelve) hours as needed (neuropathy).     latanoprost 0.005 % ophthalmic solution  Commonly known as:  XALATAN  Place 1 drop into both eyes at bedtime.     Menthol (Topical Analgesic) 4 % Gel  Apply 1 application topically every 6 (six) hours as needed (pain). Apply to shoulders/lower back     multivitamin Tabs tablet  Take 1 tablet by mouth at bedtime.     nitroGLYCERIN 0.4 MG SL tablet  Commonly known as:  NITROSTAT  Place 1 tablet (0.4 mg total) under the tongue every 5 (five) minutes as needed for chest pain.  sulindac 200 MG tablet  Commonly known as:  CLINORIL  Take 200 mg by mouth 2 (two) times daily.         The results of significant diagnostics from this hospitalization (including imaging, microbiology, ancillary and laboratory) are listed below for reference.    Significant Diagnostic Studies: Dg Chest 1 View  02/18/2015  CLINICAL DATA:  Congestive heart failure EXAM: CHEST 1 VIEW COMPARISON:  August 16, 2014 FINDINGS: There is patchy airspace opacity in the right lower lobe. The lungs elsewhere clear. There is no appreciable edema. Heart is mildly enlarged with pulmonary vascular within normal limits. No adenopathy. There is postoperative change at the cervical -thoracic junction. IMPRESSION: Patchy opacity right base. Suspect pneumonia as more likely than edema. Lungs elsewhere clear. Stable cardiac prominence. Followup PA and lateral chest radiograph recommended if possible in 3-4 weeks following trial of antibiotic therapy to ensure resolution and exclude underlying malignancy. Electronically Signed    By: Bretta Bang III M.D.   On: 02/18/2015 12:35   Dg Chest 2 View  02/19/2015  CLINICAL DATA:  Cough and wheezing EXAM: CHEST - 2 VIEW COMPARISON:  02/18/2015 FINDINGS: Cardiac shadow remains enlarged. Increasing bibasilar infiltrates are noted when compare with the prior exam. Degenerative changes of the thoracic spine are noted. IMPRESSION: Increasing bibasilar infiltrates. Electronically Signed   By: Alcide Clever M.D.   On: 02/19/2015 11:06   Dg Sacrum/coccyx  02/18/2015  CLINICAL DATA:  Pain EXAM: SACRUM AND COCCYX - 2+ VIEW COMPARISON:  Lumbar MRI August 16, 2014 FINDINGS: Frontal and lateral views obtained. There is no demonstrable fracture or diastases. No erosive change or bony destruction. There is osteoarthritic change in each sacroiliac joint. There is extensive arterial vascular calcification. There is marked narrowing at L5-S1, similar to prior MR examination. 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 No fracture or diastases. Extensive arterial vascular calcification present. Electronically Signed   By: Bretta Bang III M.D.   On: 02/18/2015 12:40   Ct Head Wo Contrast  02/18/2015  CLINICAL DATA:  Altered mental status EXAM: CT HEAD WITHOUT CONTRAST TECHNIQUE: Contiguous axial images were obtained from the base of the skull through the vertex without intravenous contrast. COMPARISON:  Head CT Sep 04, 2013 FINDINGS: There is moderate diffuse atrophy. There is no intracranial mass, hemorrhage, extra-axial fluid collection, or midline shift. There is moderate patchy small vessel disease throughout the centra semiovale bilaterally. No acute infarct is evident. The bony calvarium appears intact.  The mastoid air cells are clear. There is mucosal thickening in the right maxillary antrum. There is extensive opacification throughout the left maxillary antrum with areas containing calcification. There is apparent destruction expansion of the  medial left maxillary antral wall with soft tissue opacity extending into and obstructing the left naris. There is also extensive mucosal thickening throughout most of the ethmoid air cells. There is mucosal thickening in the inferior posterior aspects of each frontal sinus as well as mucosal thickening in the right sphenoid sinus. IMPRESSION: Atrophy with patchy periventricular small vessel disease. No intracranial mass, hemorrhage, or acute appearing infarct. Paranasal sinus disease. Note that there is bony expansion and destruction along the medial left maxillary antrum with soft tissue opacity extending throughout the left maxillary antrum and into the left naris. Question mucocele versus neoplasm arising from the left maxillary antrum. ENT consultation/ assessment in this regard advised. Electronically Signed   By: Bretta Bang III M.D.   On: 02/18/2015 13:11   Ct  Maxillofacial Wo Cm  02/18/2015  CLINICAL DATA:  Severe altered mental status, purulent discharge from RIGHT eye. Dialysis patient, bed bound. Evaluate maxillary sinus mass. EXAM: CT MAXILLOFACIAL WITHOUT CONTRAST TECHNIQUE: Multidetector CT imaging of the maxillofacial structures was performed. Multiplanar CT image reconstructions were also generated. A small metallic BB was placed on the right temple in order to reliably differentiate right from left. COMPARISON:  CT head February 18, 2015 at 1305 hours FINDINGS: Mild motion degraded examination. Mildly expanded LEFT maxillary sinus associated with bony wall thickening, soft tissue opacifies the LEFT maxillary sinus with superimposed central density. RIGHT maxillary sinus air-fluid level mucosal thickening. Mild mucosal thickening and air-fluid level RIGHT sphenoid sinus. Mild ethmoid and frontal sinus mucosal thickening. Nasal septum mildly deviated to the RIGHT with a bony spur directed toward the LEFT. Soft tissue opacifies the bilateral ostiomeatal units, frontoethmoidal recesses and  sphenoid ethmoidal recesses. No facial fracture.  Patient is edentulous. RIGHT periorbital soft tissue swelling. Bilateral facial subcutaneous fat stranding without focal fluid collection by noncontrast CT. No subcutaneous gas or radiopaque foreign bodies. Status post LEFT ocular lens implant. Ocular globes and orbital contents are nonsuspicious. 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 Superimposed LEFT maxillary antral central density, this could represent inspissated mucus or fungal sinusitis without aggressive acute component though, MRI would be more sensitive for bony invasion. RIGHT periorbital soft tissue swelling suggests cellulitis without postseptal extent. Bilateral facial subcutaneous fat stranding could represent cellulitis or edema. Electronically Signed   By: Awilda Metro M.D.   On: 02/18/2015 23:49     Microbiology: Recent Results (from the past 240 hour(s))  Blood culture (routine x 2)     Status: None (Preliminary result)   Collection Time: 02/18/15  1:57 PM  Result Value Ref Range Status   Specimen Description BLOOD RIGHT FOREARM  Final   Special Requests BOTTLES DRAWN AEROBIC AND ANAEROBIC  Final   Culture NO GROWTH 3 DAYS  Final   Report Status PENDING  Incomplete  Culture, blood (routine x 2) Call MD if unable to obtain prior to antibiotics being given     Status: None (Preliminary result)   Collection Time: 02/18/15  5:30 PM  Result Value Ref Range Status   Specimen Description BLOOD LEFT LEG  Final   Special Requests BOTTLES DRAWN AEROBIC AND ANAEROBIC  Final   Culture NO GROWTH 3 DAYS  Final   Report Status PENDING  Incomplete  MRSA PCR Screening     Status: None   Collection Time: 02/18/15 10:47 PM  Result Value Ref Range Status   MRSA by PCR NEGATIVE NEGATIVE Final    Comment:        The GeneXpert MRSA Assay (FDA approved for NASAL specimens only), is one component of  a comprehensive MRSA colonization surveillance program. It is not intended to diagnose MRSA infection nor to guide or monitor treatment for MRSA infections.      Labs: Basic Metabolic Panel:  Recent Labs Lab 02/18/15 1114 02/19/15 0705 02/20/15 0418 02/20/15 1325 02/22/15 0957  NA 133* 132* 132* 133* 132*  K 5.5* 4.8 4.4 4.8 3.9  CL 97* 95* 93* 96* 94*  CO2 20* GLUCOSE 80 77 90 124* 107*  BUN 53* 32* 36* 38* 21*  CREATININE 7.78* 5.75* 6.37* 6.65* 5.55*  CALCIUM 9.5 9.3 9.4 9.1 8.3*  PHOS  --   --   --  4.1 3.3  Liver Function Tests:  Recent Labs Lab 02/18/15 1114 02/19/15 0705 02/20/15 1325 02/22/15 0957  AST 43* 38  --   --   ALT 14* 14*  --   --   ALKPHOS 98 88  --   --   BILITOT 0.9 0.9  --   --   PROT 9.2* 7.5  --   --   ALBUMIN 3.0* 2.5* 2.4* 2.4*   No results for input(s): LIPASE, AMYLASE in the last 168 hours. No results for input(s): AMMONIA in the last 168 hours. CBC:  Recent Labs Lab 02/18/15 1114  02/20/15 0418 02/20/15 1325 02/21/15 0500 02/22/15 0424 02/22/15 0956  WBC 18.5*  < > 13.2* 12.4* 11.7* 10.3 9.7  NEUTROABS 15.3*  --   --   --   --   --  6.9  HGB 12.6*  < > 11.4* 11.3* 11.3* 11.3* 11.3*  HCT 38.8*  < > 35.9* 35.6* 35.7* 35.7* 34.8*  MCV 85.1  < > 86.5 86.4 86.2 86.9 86.1  PLT 261  < > 273 283 244 262 247  < > = values in this interval not displayed. Cardiac Enzymes: No results for input(s): CKTOTAL, CKMB, CKMBINDEX, TROPONINI in the last 168 hours. BNP: Invalid input(s): POCBNP CBG:  Recent Labs Lab 02/21/15 0733 02/21/15 1136 02/21/15 1650 02/21/15 1732 02/22/15 0754  GLUCAP 96 93 69 90 99    Time coordinating discharge:  Greater than 30 minutes  Signed:  Corneilus Heggie, DO [facility] Pager: [phone] 02/22/2015, 10:44 AM

## 2015-02-21 NOTE — Progress Notes (Signed)
Subjective:  "maybe getting stronger," asking about PT, Tolerated HD yest.staying full tx time!!  Objective Vital signs in last 24 hours: Filed Vitals:   02/20/15 1730 02/20/15 1752 02/20/15 2059 02/21/15 0537  BP: 120/67 121/85 108/47 130/63  Pulse: 73 73 77 82  Temp:  97.1 F (36.2 C) 98.7 F (37.1 C) 99.5 F (37.5 C)  TempSrc:   Oral Oral  Resp:   19 18  Height:      Weight:  108.6 kg (239 lb 6.7 oz) 108.7 kg (239 lb 10.2 oz)   SpO2:   92% 90%   Weight change: -2 kg (-4 lb 6.6 oz) Physical Exam:  Pre/post dialysis weights 10/26 112.9->108.6 (prev EDW 111) General:Alert OX3  NAD Heart: RRR , soft 1/6 sem , no rub or gallop Lungs: L>R basilar rales improved from yest ,non labored breathing Abdomen: bs+, obese soft m NT, ND Extremities: Continued decr in bipedal edema  Dialysis Access: pos bruit L fem avgg some decrease in swelling   OP Dialysis Orders: Center: [facility] on MWF . EDW 111kg HD Bath 2k/3.5 Ca uf profile 4 Time 4.5 hr Heparin 0. Access L Leg AVG  Zemplar 0 mcg IV/HD / Mircera 75 mcg q 2 wks Last on 02/13/15 Units IV/HD Venofer   Other op labs = hgb 11.4 ca 10.3 phos 4.2 pth <7    Problem/Plan:  1. AMS multifactorial - AMS continues to improve with 2 full hd tx/  plus antibiotics for PNA (and reduction in CNS active/pain medications)wants to participate with PT  2. Volume overload - HD yest on admit 3.6 l uf.  new lower edw 108 or less. With next HD will try to remove another 3-4 liters as bp allows 3. HCAP - antibiotics per primary service. BC's drawn Wbc 18>15 >13>11.7today. Still has loose sounding cough, and 10/25  CXR "increasing basilar infiltrates" (despite uf  fluid off with HD) 4. ESRD - HD MWF For HD tomorrow. 5. Hypertension/volume - 108/47  BP improving with dialysis/fluid removal/no bp meds 6. Anemia of ESRD- hgb 11.3 this am / On esa(Aranesp) 100 decr to 40/week F/u hgb, weekly iron  (continue) 7. Metabolic bone disease - No vit d pth <7 , no binders phos 4.1. 8. Chronic back pain- pain meds / avoid too much meds/ wants to do some PT 9. Left maxillary mucocele - ENT notes"chronic sinusitis wu as op.   Lenny Pastel, PA-C [facility] Beeper [phone] 02/21/2015,8:22 AM  LOS: 3 days    I have seen and examined this patient and agree with plan and assessment in the above note. Clinically improving with ATB's for PNA (Vanco and Fortaz) . Volume steadily improving with dialysis and staying on the machine full treatments. Challenge weight again tomorrow.  Garen Woolbright B,MD 02/21/2015 10:35 AM  Labs: Basic Metabolic Panel:  Recent Labs Lab 02/19/15 0705 02/20/15 0418 02/20/15 1325  NA 132* 132* 133*  K 4.8 4.4 4.8  CL 95* 93* 96*  CO2 GLUCOSE 77 90 124*  BUN 32* 36* 38*  CREATININE 5.75* 6.37* 6.65*  CALCIUM 9.3 9.4 9.1  PHOS  --   --  4.1   Liver Function Tests:  Recent Labs Lab 02/18/15 1114 02/19/15 0705 02/20/15 1325  AST 43* 38  --   ALT 14* 14*  --   ALKPHOS 98 88  --   BILITOT 0.9 0.9  --   PROT 9.2* 7.5  --   ALBUMIN 3.0* 2.5* 2.4*  Recent Labs Lab 02/18/15 1114 02/19/15 0705 02/20/15 0418 02/20/15 1325 02/21/15 0500  WBC 18.5* 15.2* 13.2* 12.4* 11.7*  NEUTROABS 15.3*  --   --   --   --   HGB 12.6* 10.9* 11.4* 11.3* 11.3*  HCT 38.8* 34.3* 35.9* 35.6* 35.7*  MCV 85.1 84.1 86.5 86.4 86.2  PLT 261 265 273 283 244     Recent Labs Lab 02/19/15 2034 02/20/15 0751 02/20/15 1127 02/20/15 2057 02/21/15 0733  GLUCAP 78 88 105* 110* 96    Studies/Results: Dg Chest 2 View  02/19/2015  CLINICAL DATA:  Cough and wheezing EXAM: CHEST - 2 VIEW COMPARISON:  02/18/2015 FINDINGS: Cardiac shadow remains enlarged. Increasing bibasilar infiltrates are noted when compare with the prior exam. Degenerative changes of the thoracic spine are noted. IMPRESSION: Increasing bibasilar infiltrates. Electronically  Signed   By: Alcide Clever M.D.   On: 02/19/2015 11:06   Medications:   . cefTAZidime (FORTAZ)  IV  2 g Intravenous Q M,W,F-HD  . darbepoetin (ARANESP) injection - DIALYSIS  40 mcg Intravenous Q Wed-HD  . dorzolamide-timolol  1 drop Both Eyes TID  . fentaNYL  75 mcg Transdermal Q72H  . ferric gluconate (FERRLECIT/NULECIT) IV  62.5 mg Intravenous Q Wed-HD  . insulin aspart  0-5 Units Subcutaneous QHS  . insulin aspart  0-9 Units Subcutaneous TID WC  . latanoprost  1 drop Both Eyes QHS  . multivitamin  1 tablet Oral QHS  . sodium chloride  3 mL Intravenous Q12H  . sodium chloride  3 mL Intravenous Q12H  . sulindac  200 mg Oral BID  . vancomycin  1,000 mg Intravenous Q M,W,F-HD

## 2015-02-21 NOTE — Progress Notes (Signed)
PROGRESS NOTE  [name]:[id number] DOB: 05-17-1955 DOA: 02/18/2015 PCP: Dyke Maes, MD  Brief History 59 y.o. male with a past medical history of CHF, end-stage renal disease on hemodialysis Monday Wednesday Friday, diabetes diet controlled, hypertension presents to the emergency from dialysis with the chief complaint of acute altered mental status and of low back pain. Initial evaluation in the emergency department includes a chest x-ray concerning for pneumonia, uncontrolled hypertension, leukocytosis. Information is obtained from the chart and the patient however information from patient may be somewhat reliable secondary to acute encephalopathy. According to the dialysis staff at his dialysis center, the patient looked weaker than usual and complaint of worsening back pain. The patient was noted to be more confused and started coughing up brown sputum. The patient also has had a history of signing off dialysis early. The patient has not had a full treatment since 12/14/2014. The patient had been using oxycodone 10 mg preHD, 5 mg TID prn, baclofen  bid prn, ativan 0.5 mg q 8 hours prn and fentanyl Assessment/Plan: Acute encephalopathy -multifactorial including uremia from missed dialysis, pneumonia, opioids/benzo and hypertensive encephalopathy (BP 217/114) -CT head negative for hemorrhage or acute infarct -CT maxillofacial shows acute on chronic pan paranasal sinusitis, possible mucocele left maxillary sinus, superimposed left maxillary antral central density, right. Orbital soft tissue swelling suggestive of cellulitis -overall improved -Minimize opioids  Left maxillary mucocele -Noted on CT Maxillofacial -ENT consulted and appreciated--likely related to chronic sinusitis-->outpt workup  HCAP (healthcare-associated pneumonia)  -Patient lives at a rehabilitation facility as well as 3 times a week dialysis.  -Continue vancomycin and fortaz D#4 -last dose  of abx on HD on 10/28 -WBC improving, currently afebrile -Blood cultures--negative to date  CAD (coronary artery disease)/ Elevated troponin  -Denies chest pain. Troponin only mildly elevated and close to his baseline range. -hx of inferior STEMI with LAD stent Jan 2015-->restart ASA and plavix  HTN (hypertension)  -Patient is not on any home antihypertensive meds  -Continue labetalol as needed   End-stage renal disease on hemodialysis (HCC)  -Dialyzes Monday, Wednesday, Friday -pt frequently signs off early -Has not had full HD session since 12/14/14 -Nephrology consult appreciated   Hyperkalemia  -Resolved, Potassium level 5.5 on admission.  -Continue to monitor BMP  Normocytic, normochromic anemia  -Hemoglobin currently 10.9, above previous baseline of approximately 7-8  Diabetes mellitus, type 2--diet controlled  -Not on any home medication  -hemoglobin A1c--5.6 -d/c CBG checks and SSI  Chronic back pain  -Has a history of chronic low back pain as well as bilateral feet pain.  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 The lungs elsewhere clear. There is no appreciable edema. Heart is mildly enlarged with pulmonary vascular within normal limits. No adenopathy. There is postoperative change at the cervical -thoracic junction. IMPRESSION: Patchy opacity right base. Suspect  pneumonia as more likely than edema. Lungs  elsewhere clear. Stable cardiac prominence. Followup PA and lateral chest radiograph recommended if possible in 3-4 weeks following trial of antibiotic therapy to ensure resolution and exclude underlying malignancy. Electronically Signed   By: Bretta Bang III M.D.   On: 02/18/2015 12:35   Dg Chest 2 View  02/19/2015  CLINICAL DATA:  Cough and wheezing EXAM: CHEST - 2 VIEW COMPARISON:  02/18/2015 FINDINGS: Cardiac shadow remains enlarged. Increasing bibasilar infiltrates are noted when compare with the prior exam. Degenerative changes of the thoracic spine are noted. IMPRESSION: Increasing bibasilar infiltrates. Electronically Signed   By: Alcide Clever M.D.   On: 02/19/2015 11:06   Dg Sacrum/coccyx  02/18/2015  CLINICAL DATA:  Pain EXAM: SACRUM AND COCCYX - 2+ VIEW COMPARISON:  Lumbar MRI August 16, 2014 FINDINGS: Frontal and lateral views obtained. There is no demonstrable fracture or diastases. No erosive change or bony destruction. There is osteoarthritic change in each sacroiliac joint. There is extensive arterial vascular calcification. There is marked narrowing at L5-S1, similar to prior MR examination. 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 No fracture or diastases. Extensive arterial vascular calcification present. Electronically Signed   By: Bretta Bang III M.D.   On: 02/18/2015 12:40   Ct Head Wo Contrast  02/18/2015  CLINICAL DATA:  Altered mental status EXAM: CT HEAD WITHOUT CONTRAST TECHNIQUE: Contiguous axial images were obtained from the base of the skull through the vertex without intravenous contrast. COMPARISON:  Head CT Sep 04, 2013 FINDINGS: There is moderate diffuse atrophy. There is no intracranial mass, hemorrhage, extra-axial fluid collection, or midline shift. There is moderate patchy small vessel disease throughout the centra semiovale bilaterally. No acute infarct is evident. The  bony calvarium appears intact.  The mastoid air cells are clear. There is mucosal thickening in the right maxillary antrum. There is extensive opacification throughout the left maxillary antrum with areas containing calcification. There is apparent destruction expansion of the medial left maxillary antral wall with soft tissue opacity extending into and obstructing the left naris. There is also extensive mucosal thickening throughout most of the ethmoid air cells. There is mucosal thickening in the inferior posterior aspects of each frontal sinus as well as mucosal thickening in the right sphenoid sinus. IMPRESSION: Atrophy with patchy periventricular small vessel disease. No intracranial mass, hemorrhage, or acute appearing infarct. Paranasal sinus disease. Note that there is bony expansion and destruction along the medial left maxillary antrum with soft tissue opacity extending throughout the left maxillary antrum and into the left naris. Question mucocele versus neoplasm arising from the left maxillary antrum. ENT consultation/ assessment in this regard advised. Electronically Signed   By: Bretta Bang III M.D.   On: 02/18/2015 13:11   Ct Maxillofacial Wo Cm  02/18/2015  CLINICAL DATA:  Severe altered mental status, purulent discharge from RIGHT eye. Dialysis patient, bed bound. Evaluate maxillary sinus mass. EXAM: CT MAXILLOFACIAL WITHOUT CONTRAST TECHNIQUE: Multidetector CT imaging of the maxillofacial structures was performed. Multiplanar CT image reconstructions were also generated. A small metallic BB was placed on the right temple in order to reliably differentiate right from left. COMPARISON:  CT head February 18, 2015 at 1305 hours FINDINGS: Mild motion degraded examination. Mildly expanded LEFT maxillary sinus associated with bony wall thickening, soft tissue opacifies the LEFT maxillary sinus with superimposed central density. RIGHT maxillary sinus air-fluid level mucosal thickening. Mild  mucosal thickening and air-fluid level RIGHT sphenoid sinus. Mild ethmoid and frontal sinus mucosal thickening. Nasal septum mildly deviated  to the RIGHT with a bony spur directed toward the LEFT. Soft tissue opacifies the bilateral ostiomeatal units, frontoethmoidal recesses and sphenoid ethmoidal recesses. No facial fracture.  Patient is edentulous. RIGHT periorbital soft tissue swelling. Bilateral facial subcutaneous fat stranding without focal fluid collection by noncontrast CT. No subcutaneous gas or radiopaque foreign bodies. Status post LEFT ocular lens implant. Ocular globes and orbital contents are nonsuspicious. 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 Superimposed LEFT maxillary antral central density, this could represent inspissated mucus or fungal sinusitis without aggressive acute component though, MRI would be more sensitive for bony invasion. RIGHT periorbital soft tissue swelling suggests cellulitis without postseptal extent. Bilateral facial subcutaneous fat stranding could represent cellulitis or edema. Electronically Signed   By: Awilda Metro M.D.   On: 02/18/2015 23:49         Subjective: Patient denies fevers, chills, headache, chest pain, dyspnea, nausea, vomiting, diarrhea, abdominal pain, dysuria, hematuria   Objective: Filed Vitals:   02/20/15 1752 02/20/15 2059 02/21/15 0537 02/21/15 0941  BP: 121/85 108/47 130/63 122/65  Pulse: 73 77 82 81  Temp: 97.1 F (36.2 C) 98.7 F (37.1 C) 99.5 F (37.5 C) 98.2 F (36.8 C)  TempSrc:  Oral Oral Oral  Resp:  19 18 18   Height:      Weight: 108.6 kg (239 lb 6.7 oz) 108.7 kg (239 lb 10.2 oz)    SpO2:  92% 90% 95%    Intake/Output Summary (Last 24 hours) at 02/21/15 1605 Last data filed at 02/21/15 0815  Gross per 24 hour  Intake    470 ml  Output   3600 ml  Net  -3130 ml   Weight change: -2 kg (-4 lb 6.6 oz) Exam:   General:  Pt is  alert, follows commands appropriately, not in acute distress  HEENT: No icterus, No thrush, No neck mass, [place]/AT  Cardiovascular: RRR, S1/S2, no rubs, no gallops  Respiratory: Bibasilar rales. No wheezing. Good air movement  Abdomen: Soft/+BS, non tender, non distended, no guarding  Extremities: trace LE edema, No lymphangitis, No petechiae, No rashes, no synovitis  Data Reviewed: Basic Metabolic Panel:  Recent Labs Lab 02/18/15 1114 02/19/15 0705 02/20/15 0418 02/20/15 1325  NA 133* 132* 132* 133*  K 5.5* 4.8 4.4 4.8  CL 97* 95* 93* 96*  CO2 20* 23 26 24   GLUCOSE 80 77 90 124*  BUN 53* 32* 36* 38*  CREATININE 7.78* 5.75* 6.37* 6.65*  CALCIUM 9.5 9.3 9.4 9.1  PHOS  --   --   --  4.1   Liver Function Tests:  Recent Labs Lab 02/18/15 1114 02/19/15 0705 02/20/15 1325  AST 43* 38  --   ALT 14* 14*  --   ALKPHOS 98 88  --   BILITOT 0.9 0.9  --   PROT 9.2* 7.5  --   ALBUMIN 3.0* 2.5* 2.4*   No results for input(s): LIPASE, AMYLASE in the last 168 hours. No results for input(s): AMMONIA in the last 168 hours. CBC:  Recent Labs Lab 02/18/15 1114 02/19/15 0705 02/20/15 0418 02/20/15 1325 02/21/15 0500  WBC 18.5* 15.2* 13.2* 12.4* 11.7*  NEUTROABS 15.3*  --   --   --   --   HGB 12.6* 10.9* 11.4* 11.3* 11.3*  HCT 38.8* 34.3* 35.9* 35.6* 35.7*  MCV 85.1 84.1 86.5 86.4 86.2  PLT 261 265 273 283 244   Cardiac Enzymes: No results for input(s): CKTOTAL, CKMB, CKMBINDEX,  TROPONINI in the last 168 hours. BNP: Invalid input(s): POCBNP CBG:  Recent Labs Lab 02/20/15 0751 02/20/15 1127 02/20/15 2057 02/21/15 0733 02/21/15 1136  GLUCAP 88 105* 110* 96 93    Recent Results (from the past 240 hour(s))  Blood culture (routine x 2)     Status: None (Preliminary result)   Collection Time: 02/18/15  1:57 PM  Result Value Ref Range Status   Specimen Description BLOOD RIGHT FOREARM  Final   Special Requests BOTTLES DRAWN AEROBIC AND ANAEROBIC  Final   Culture  NO GROWTH 3 DAYS  Final   Report Status PENDING  Incomplete  Culture, blood (routine x 2) Call MD if unable to obtain prior to antibiotics being given     Status: None (Preliminary result)   Collection Time: 02/18/15  5:30 PM  Result Value Ref Range Status   Specimen Description BLOOD LEFT LEG  Final   Special Requests BOTTLES DRAWN AEROBIC AND ANAEROBIC  Final   Culture NO GROWTH 3 DAYS  Final   Report Status PENDING  Incomplete  MRSA PCR Screening     Status: None   Collection Time: 02/18/15 10:47 PM  Result Value Ref Range Status   MRSA by PCR NEGATIVE NEGATIVE Final    Comment:        The GeneXpert MRSA Assay (FDA approved for NASAL specimens only), is one component of a comprehensive MRSA colonization surveillance program. It is not intended to diagnose MRSA infection nor to guide or monitor treatment for MRSA infections.      Scheduled Meds: . cefTAZidime (FORTAZ)  IV  2 g Intravenous Q M,W,F-HD  . darbepoetin (ARANESP) injection - DIALYSIS  40 mcg Intravenous Q Wed-HD  . dorzolamide-timolol  1 drop Both Eyes TID  . fentaNYL  75 mcg Transdermal Q72H  . ferric gluconate (FERRLECIT/NULECIT) IV  62.5 mg Intravenous Q Wed-HD  . insulin aspart  0-5 Units Subcutaneous QHS  . insulin aspart  0-9 Units Subcutaneous TID WC  . latanoprost  1 drop Both Eyes QHS  . multivitamin  1 tablet Oral QHS  . sodium chloride  3 mL Intravenous Q12H  . sodium chloride  3 mL Intravenous Q12H  . sulindac  200 mg Oral BID  . vancomycin  1,000 mg Intravenous Q M,W,F-HD   Continuous Infusions:    Deaire Mcwhirter, DO  [facility] Pager [phone]  If 7PM-7AM, please contact night-coverage [email or web address] Password [email or web address] 02/21/2015, 4:05 PM   LOS: 3 days

## 2015-02-21 NOTE — Progress Notes (Signed)
Physical Therapy Treatment Patient Details Name: [name] MRN: [id number] DOB: 03/25/1956 Today's Date: 02/21/2015    History of Present Illness [name] is a 59 y.o. male with a past medical history of CHF, end-stage renal disease on hemodialysis Monday Wednesday Friday, diabetes diet controlled, hypertension presents to the emergency from dialysis with the chief complaint of acute altered mental status he complains of low back pain. Initial evaluation in the emergency department includes a chest x-ray concerning for pneumonia, uncontrolled hypertension, leukocytosis    PT Comments    More alert today but still having trouble following single step commands consistently. Eager to perform exercises. Tolerated EOB balance training and therapeutic exercises x20 minutes today. Requires mod assist for bed mobility. Still unable to provide accurate history of prior level of function. Patient will continue to benefit from skilled physical therapy services to further improve independence with functional mobility.   Follow Up Recommendations  SNF;Supervision/Assistance - 24 hour     Equipment Recommendations  None recommended by PT    Recommendations for Other Services OT consult     Precautions / Restrictions Precautions Precautions: Fall Restrictions Weight Bearing Restrictions: No    Mobility  Bed Mobility Overal bed mobility: Needs Assistance Bed Mobility: Rolling;Sidelying to Sit;Sit to Sidelying Rolling: Mod assist Sidelying to sit: Mod assist     Sit to sidelying: Mod assist General bed mobility comments: Cues for log roll technique with facilitory assist to reach for rail, and mod assist for LE support into and out of bed. Pt able to pull through RUE on PTs hand to assist with trunk support into seated position  Transfers                 General transfer comment: Not safe to perform at this time due to weakness and decreased cognition/safety awareness. bed  placed into chair position for lunch.  Ambulation/Gait                 Stairs            Wheelchair Mobility    Modified Rankin (Stroke Patients Only)       Balance Overall balance assessment: Needs assistance Sitting-balance support: No upper extremity supported;Feet supported Sitting balance-Leahy Scale: Fair Sitting balance - Comments: Sat EOB x20 minutes today. Focusing on trunk control and unsupported sitting. Able to reach up and anteriorly with intermittent loss of motor control. Requires cues at times to hold rail for support. Difficulty with Rt shoulder flexion to 90 degrees.                             Cognition Arousal/Alertness: Lethargic Behavior During Therapy: Flat affect Overall Cognitive Status: No family/caregiver present to determine baseline cognitive functioning Area of Impairment: Orientation;Following commands;Problem solving;Safety/judgement Orientation Level: Disoriented to;Place;Time;Situation   Memory: Decreased short-term memory Following Commands: Follows one step commands inconsistently Safety/Judgement: Decreased awareness of safety;Decreased awareness of deficits   Problem Solving: Slow processing;Decreased initiation;Difficulty sequencing;Requires verbal cues;Requires tactile cues General Comments: Improved alertness from previous session but still lethargic requiring frequent cues to maintain awareness of task at hand    Exercises General Exercises - Lower Extremity Ankle Circles/Pumps: AAROM;Both;15 reps;Supine Long Arc Quad: Strengthening;Both;10 reps;Seated;AAROM Hip ABduction/ADduction: AAROM;Strengthening;Both;10 reps;Seated Hip Flexion/Marching: AAROM;Strengthening;Both;10 reps;Seated    General Comments        Pertinent Vitals/Pain Pain Assessment: Faces Faces Pain Scale: Hurts even more (with movement of RLE and back) Pain Location: back, Rt  hip/thigh Pain Descriptors / Indicators:  Grimacing;Guarding Pain Intervention(s): Monitored during session;Repositioned;Limited activity within patient's tolerance    Home Living                      Prior Function            PT Goals (current goals can now be found in the care plan section) Acute Rehab PT Goals Patient Stated Goal: none stated PT Goal Formulation: Patient unable to participate in goal setting Time For Goal Achievement: 03/05/15 Potential to Achieve Goals: Fair Progress towards PT goals: Progressing toward goals    Frequency  Min 3X/week    PT Plan Current plan remains appropriate    Co-evaluation             End of Session   Activity Tolerance: Patient limited by lethargy;Other (comment) (poor cognition, inability to consistently follow commands) Patient left: in bed;with call bell/phone within reach;with bed alarm set     Time: 1610-9604 PT Time Calculation (min) (ACUTE ONLY): 36 min  Charges:  $Therapeutic Exercise: 8-22 mins $Therapeutic Activity: 8-22 mins                    G Codes:      Berton Mount Mar 12, 2015, 1:21 PM [place], [place] [phone]

## 2015-02-21 NOTE — Care Management Important Message (Signed)
Important Message  Patient Details  Name: QUANTARIUS SWIDER MRN: [id number] Date of Birth: 03-30-1956   Medicare Important Message Given:  Yes-second notification given    Kyla Balzarine 02/21/2015, 10:44 AM

## 2015-02-22 LAB — CBC WITH DIFFERENTIAL/PLATELET
BASOS ABS: 0 10*3/uL (ref 0.0–0.1)
Basophils Relative: 0 %
EOS PCT: 4 %
Eosinophils Absolute: 0.4 10*3/uL (ref 0.0–0.7)
HCT: 34.8 % — ABNORMAL LOW (ref 39.0–52.0)
Hemoglobin: 11.3 g/dL — ABNORMAL LOW (ref 13.0–17.0)
LYMPHS PCT: 9 %
Lymphs Abs: 0.9 10*3/uL (ref 0.7–4.0)
MCH: 28 pg (ref 26.0–34.0)
MCHC: 32.5 g/dL (ref 30.0–36.0)
MCV: 86.1 fL (ref 78.0–100.0)
Monocytes Absolute: 1.5 10*3/uL — ABNORMAL HIGH (ref 0.1–1.0)
Monocytes Relative: 15 %
Neutro Abs: 6.9 10*3/uL (ref 1.7–7.7)
Neutrophils Relative %: 72 %
Platelets: 247 10*3/uL (ref 150–400)
RBC: 4.04 MIL/uL — AB (ref 4.22–5.81)
RDW: 17.4 % — ABNORMAL HIGH (ref 11.5–15.5)
WBC: 9.7 10*3/uL (ref 4.0–10.5)

## 2015-02-22 LAB — RENAL FUNCTION PANEL
ALBUMIN: 2.4 g/dL — AB (ref 3.5–5.0)
ANION GAP: 11 (ref 5–15)
BUN: 21 mg/dL — AB (ref 6–20)
CHLORIDE: 94 mmol/L — AB (ref 101–111)
CO2: 27 mmol/L (ref 22–32)
Calcium: 8.3 mg/dL — ABNORMAL LOW (ref 8.9–10.3)
Creatinine, Ser: 5.55 mg/dL — ABNORMAL HIGH (ref 0.61–1.24)
GFR, EST AFRICAN AMERICAN: 12 mL/min — AB (ref >60)
GFR, EST NON AFRICAN AMERICAN: 10 mL/min — AB (ref >60)
Glucose, Bld: 107 mg/dL — ABNORMAL HIGH (ref 65–99)
PHOSPHORUS: 3.3 mg/dL (ref 2.5–4.6)
POTASSIUM: 3.9 mmol/L (ref 3.5–5.1)
Sodium: 132 mmol/L — ABNORMAL LOW (ref 135–145)

## 2015-02-22 LAB — GLUCOSE, CAPILLARY
GLUCOSE-CAPILLARY: 99 mg/dL (ref 65–99)
GLUCOSE-CAPILLARY: 123 mg/dL — AB (ref 65–99)

## 2015-02-22 LAB — CBC
HCT: 35.7 % — ABNORMAL LOW (ref 39.0–52.0)
HEMOGLOBIN: 11.3 g/dL — AB (ref 13.0–17.0)
MCH: 27.5 pg (ref 26.0–34.0)
MCHC: 31.7 g/dL (ref 30.0–36.0)
MCV: 86.9 fL (ref 78.0–100.0)
Platelets: 262 10*3/uL (ref 150–400)
RBC: 4.11 MIL/uL — AB (ref 4.22–5.81)
RDW: 17.7 % — ABNORMAL HIGH (ref 11.5–15.5)
WBC: 10.3 10*3/uL (ref 4.0–10.5)

## 2015-02-22 NOTE — Progress Notes (Signed)
[facility] Progress Note  Assessment/Plan: 1. AMS multifactorial - AMS continues to improve with 2 full hd tx/ plus antibiotics for PNA (and reduction in CNS active/pain medications)wants to participate with PT - should not be on baclofen in the future 2. Volume overload -3.5, 3.6 net UF last two treatments - attempt new goal of 108 or lower - goal 4 L today Post HD weight will be new dry weight and can slowly continue to challenge at the outpt unit. 3. HCAP - on Fortaz/Vanc- to be stopped at d/c per discussion with Dr. Arbutus Leas. Still has loose sounding cough, and 10/25 CXR "increasing basilar infiltrates" (despite uf fluid off with HD) 4. ESRD - HD MWF  HD today - Na low - recheck today- hopefully will do better staying on HD tratments after d/c 5. BP/volume - BP improved with dialysis/fluid removal/no bp meds; lower edw 108 or even lower;  has prn labetolol needed after d/c - if he needs to use this, I suspect it would be an indication that his volume is too high 6. 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 Metabolic bone disease - No vit d pth <7 s/p parthyroidectomy , no binders phos 4.1. 8. Chronic back pain- on fentanyl 75 patch, clinoril and prn ativan 9. Left maxillary mucocele - ENT notes"chronic sinusitis wu as op. 10. Disp - d/c today planned - pt to return to SNF in [place] after HD  Sheffield Slider, PA-C [facility] Beeper [phone] 02/22/2015,9:03 AM  LOS: 4 days    I have seen and examined this patient and agree with plan and assessment in the above note of MBergman. Pt for discharge back to SNF after HD. Today is last dose of ATB's for PNA. We have successfully lowered his weight (he has completed FULL treatments in the hospital) so he will have much lower EDW at discharge (prev 111) . PreHD was 107.1 today with a 3-4 liter goal and he is tolerating his treatment well thus far.  Post wt  = new EDW.  [name] B,MD 02/22/2015 10:49 AM  Subjective:   No c/o, still with loose cough  Objective Filed Vitals:   02/21/15 0941 02/21/15 1800 02/21/15 2100 02/22/15 0500  BP: 122/65 119/75 117/67 115/66  Pulse: 81 78 73 75  Temp: 98.2 F (36.8 C) 97.8 F (36.6 C) 98.5 F (36.9 C) 98.4 F (36.9 C)  TempSrc: Oral Oral Oral Oral  Resp: 18 18 18 18   Height:      Weight:   107.14 kg (236 lb 3.2 oz)   SpO2: 95% 99% 94% 100%   Physical Exam HD being initiated General: NAD soft spoken Heart: RRR Lungs: coarse BS, tried to get to clear with cough but weak cough- maybe because he is supine Abdomen: soft Extremities: tr-1+ LLE edema Dialysis Access: left fem AVGG + bruit  Dialysis Orders: Center: [facility] on MWF . EDW 111kg HD Bath 2k/3.5 Ca uf profile 4 Time 4.5 hr Heparin 0. Access L Leg AVG  Zemplar 0 mcg IV/HD / Mircera 75 mcg q 2 wks Last on 02/13/15 Units IV/HD Venofer 100mg   Other op labs = hgb 11.4 ca 10.3 phos 4.2 pth <7    Additional Objective Labs: Basic Metabolic Panel:  Recent Labs Lab 02/19/15 0705 02/20/15 0418 02/20/15 1325  NA 132* 132* 133*  K 4.8 4.4 4.8  CL 95* 93* 96*  CO2 23 26 24   GLUCOSE 77  90 124*  BUN 32* 36* 38*  CREATININE 5.75* 6.37* 6.65*  CALCIUM 9.3 9.4 9.1  PHOS  --   --  4.1   Liver Function Tests:  Recent Labs Lab 02/18/15 1114 02/19/15 0705 02/20/15 1325  AST 43* 38  --   ALT 14* 14*  --   ALKPHOS 98 88  --   BILITOT 0.9 0.9  --   PROT 9.2* 7.5  --   ALBUMIN 3.0* 2.5* 2.4*   CBC:  Recent Labs Lab 02/18/15 1114 02/19/15 0705 02/20/15 0418 02/20/15 1325 02/21/15 0500 02/22/15 0424  WBC 18.5* 15.2* 13.2* 12.4* 11.7* 10.3  NEUTROABS 15.3*  --   --   --   --   --   HGB 12.6* 10.9* 11.4* 11.3* 11.3* 11.3*  HCT 38.8* 34.3* 35.9* 35.6* 35.7* 35.7*  MCV 85.1 84.1 86.5 86.4 86.2 86.9  PLT 261 265 273 283 244 262   Blood Culture    Component Value Date/Time   SDES BLOOD LEFT LEG 02/18/2015  1730   SPECREQUEST BOTTLES DRAWN AEROBIC AND ANAEROBIC 02/18/2015 1730   CULT NO GROWTH 3 DAYS 02/18/2015 1730   REPTSTATUS PENDING 02/18/2015 1730    CBG:  Recent Labs Lab 02/21/15 0733 02/21/15 1136 02/21/15 1650 02/21/15 1732 02/22/15 0754  GLUCAP 96 93 69 90 99  Medications:   . aspirin  81 mg Oral Daily  . cefTAZidime (FORTAZ)  IV  2 g Intravenous Q M,W,F-HD  . clopidogrel  75 mg Oral Daily  . darbepoetin (ARANESP) injection - DIALYSIS  40 mcg Intravenous Q Wed-HD  . dorzolamide-timolol  1 drop Both Eyes TID  . fentaNYL  75 mcg Transdermal Q72H  . ferric gluconate (FERRLECIT/NULECIT) IV  62.5 mg Intravenous Q Wed-HD  . latanoprost  1 drop Both Eyes QHS  . multivitamin  1 tablet Oral QHS  . sodium chloride  3 mL Intravenous Q12H  . sodium chloride  3 mL Intravenous Q12H  . sulindac  200 mg Oral BID  . vancomycin  1,000 mg Intravenous Q M,W,F-HD

## 2015-02-22 NOTE — Consult Note (Signed)
   [facility] CM Inpatient Consult   02/22/2015  DEVINE KLINGEL 1955/05/25 [id number] Patient was referred to [facility] via the High Risk list.  Patient was assessed for [facility] for community services. Patient was previously  with [facility] out reach.  Met with patient at bedside regarding being restarted with [facility] services at his skilled nursing facility @ [facility].  Met with the patient at bedside.  Patient states he is not sure if he will actually be a permanent resident at the facility. He states that his mother and sister helps him make decisions.  Of note, [facility] Care Management services does not replace or interfere with any services that are arranged by inpatient case management or social work. For additional questions or referrals please contact: Natividad Brood, RN BSN [facility] Liaison  [phone] business mobile phone

## 2015-02-22 NOTE — Progress Notes (Signed)
Patient discharged to [facility] and rehab,transported by [facility].Facility made aware that patient is on his way. Cyril Railey, Drinda Butts, Charity fundraiser

## 2015-02-22 NOTE — Clinical Social Work Note (Signed)
Clinical Social Work Assessment  Patient Details  Name: [name] MRN: [id number] Date of Birth: 10-12-1955  Date of referral:  02/19/15               Reason for consult:  Facility Placement                Permission sought to share information with:  Family Supports Permission granted to share information::  Yes, Verbal Permission Granted  Name::     Eaden Craddick  Agency::     Relationship::  Wife  Contact Information:  [phone]  Housing/Transportation Living arrangements for the past 2 months:  Skilled Nursing Facility Source of Information:  Patient Patient Interpreter Needed:  None Criminal Activity/Legal Involvement Pertinent to Current Situation/Hospitalization:  No - Comment as needed Significant Relationships:    Lives with:  Spouse, Parents Do you feel safe going back to the place where you live?  Yes Need for family participation in patient care:  Yes (Comment)  Care giving concerns:  None expressed by patient   Social Worker assessment / plan:  CSW talked with patient on 02/22/15 and confirmed his intent to return to [facility].  Patient requested that CSW contact his wife to advise her that transport has been called.  Employment status:  Disabled (Comment on whether or not currently receiving Disability) Insurance information:  Medicare, Medicaid In [place] PT Recommendations:  Skilled Nursing Facility Information / Referral to community resources:  Other (Comment Required) (None needed at this time)  Patient/Family's Response to care:  No concerns expressed.  Patient/Family's Understanding of and Emotional Response to Diagnosis, Current Treatment, and Prognosis:  Not discussed.  Emotional Assessment Appearance:  Appears older than stated age Attitude/Demeanor/Rapport:  Other (Appropriate) Affect (typically observed):  Appropriate Orientation:  Oriented to Self, Oriented to Place, Oriented to  Time, Oriented to Situation Alcohol / Substance  use:  Tobacco Use (Patient reports that he smokes cigarettes, but does not drink or use illicit drugs.) Psych involvement (Current and /or in the community):  No (Comment)  Discharge Needs  Concerns to be addressed:  Discharge Planning Concerns Readmission within the last 30 days:  No Current discharge risk:  None Barriers to Discharge:  No Barriers Identified   Cristobal Goldmann, LCSW 02/22/2015, 7:32 PM

## 2015-02-22 NOTE — Progress Notes (Signed)
Patient is getting discharged to [facility] and [employer].  Given report to [facility] (nurse) at facility and answered all her questions.  Discontinued IV before discharge. Discontinued tele before discharge, CCMD notified.  On coming nurse made aware of the situation.

## 2015-02-22 NOTE — Care Management Note (Signed)
Case Management Note  Patient Details  Name: JERMIAH LAMPING MRN: [id number] Date of Birth: [birth year]  Subjective/Objective:     CM following for progression and d/c planning.               Action/Plan: Plan for pt to return to SNF, [facility] Rehab. CSW, Genelle Bal working with this pt. No HH or DME needs.   Expected Discharge Date:      02/22/2015            Expected Discharge Plan:  Skilled Nursing Facility  In-House Referral:  Clinical Social Work  Discharge planning Services  NA  Post Acute Care Choice:  NA Choice offered to:  NA  DME Arranged:  N/A DME Agency:  NA  HH Arranged:  NA HH Agency:  NA  Status of Service:  Completed, signed off  Medicare Important Message Given:  Yes-second notification given Date Medicare IM Given:    Medicare IM give by:    Date Additional Medicare IM Given:    Additional Medicare Important Message give by:     If discussed at Long Length of Stay Meetings, dates discussed:    Additional Comments:  Starlyn Skeans, RN 02/22/2015, 11:44 AM

## 2015-02-22 NOTE — NC FL2 (Signed)
MEDICAID FL2 LEVEL OF CARE SCREENING TOOL     IDENTIFICATION  Patient Name: [name] Birthdate: 1955/12/23 Sex: male Admission Date (Current Location): 02/18/2015  [place] and [place] Number: Haynes Bast [id number] Facility and Address:  [facility], [address] [place], [place] [address]      Provider Number: [id number]  Attending Physician Name and Address:  Catarina Hartshorn, MD  Relative Name and Phone Number:  Elier Zellars, mother. Phone number [phone]     Current Level of Care: Hospital Recommended Level of Care: Nursing Facility [facility] and Rehab) Prior Approval Number:    Date Approved/Denied:   PASRR Number:    Discharge Plan: SNF    Current Diagnoses: Patient Active Problem List   Diagnosis Date Noted  . Maxillary sinus mass   . HCAP (healthcare-associated pneumonia) 02/18/2015  . Acute encephalopathy 02/18/2015  . Abnormal head CT 02/18/2015  . Chronic diastolic CHF (congestive heart failure) (HCC)   . Tobacco use   . Diabetes (HCC)   . 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 Complications due to renal dialysis device, implant, and graft (HCC)   . Bleeding from dialysis shunt (HCC)   . ESRD on dialysis (HCC) 10/07/2014  . Chronic hepatitis C without hepatic coma (HCC) 09/26/2014  . Chronic HF (heart failure) (HCC) 08/19/2014  . Back pain   . Abnormal EKG   . Abnormal liver function test   . Lumbago   . Diabetic foot ulcer (HCC)   . Myositis   . NSTEMI (non-ST elevated myocardial infarction) (HCC)   . Thigh pain   . Infection and inflammatory reaction due to cardiac device, implant, and graft (HCC)   . Elevated troponin 08/10/2014  . Abnormal transaminases 08/10/2014  . Sepsis associated hypotension (HCC) 08/10/2014  . Chest pain 08/10/2014  . Weakness 08/10/2014  . CAD (coronary artery disease)   . Shock circulatory (HCC)   . Pain in the chest   . Spinal stenosis of lumbar region   . Cellulitis of  left lower extremity   . Hyperkalemia   . Acute superficial venous thrombosis of lower extremity   . Radicular leg pain   . Right-sided low back pain with right-sided sciatica   . Right leg pain 06/19/2014  . Acute myocardial infarction of right ventricle (HCC) 05/22/2013  . History of placement of stent in LAD coronary artery 05/22/2013  . HTN (hypertension) 05/22/2013  . End-stage renal disease on hemodialysis (HCC) 07/23/2011  . Arthritis     Orientation ACTIVITIES/SOCIAL BLADDER RESPIRATION    Place (Patient also oriented to person)  Passive Continent Normal  BEHAVIORAL SYMPTOMS/MOOD NEUROLOGICAL BOWEL NUTRITION STATUS      Incontinent Diet (Renal diet with fluid restrictions)  PHYSICIAN VISITS COMMUNICATION OF NEEDS Height & Weight Skin    Verbally   227 lbs. Normal          AMBULATORY STATUS RESPIRATION    Assist extensive (Patient was unable to ambulate or transfer due to weakness) Normal      Personal Care Assistance Level of Assistance  Bathing, Dressing, Total care, Feeding Bathing Assistance: Limited assistance Feeding assistance: Independent Dressing Assistance: Limited assistance Total Care Assistance: Limited assistance    Functional Limitations Info                SPECIAL CARE FACTORS FREQUENCY  PT (By licensed PT)     PT Frequency: Minimum 3 times a week  Additional Factors Info  Code Status, Allergies Code Status Info: FULL CODE Allergies Info: NO KNOWN ALLERGIES           Current Medications (02/22/2015): Current Facility-Administered Medications  Medication Dose Route Frequency Provider Last Rate Last Dose  . 0.9 %  sodium chloride infusion  250 mL Intravenous PRN Gwenyth Bender, NP      . acetaminophen (TYLENOL) tablet 650 mg  650 mg Oral Q6H PRN Gwenyth Bender, NP       Or  . acetaminophen (TYLENOL) suppository 650 mg  650 mg Rectal Q6H PRN Gwenyth Bender, NP      . aspirin chewable tablet 81 mg  81 mg Oral Daily Catarina Hartshorn, MD   81 mg at 02/22/15 1530  . cefTAZidime (FORTAZ) 2 g in dextrose 5 % 50 mL IVPB  2 g Intravenous Q M,W,F-HD Scarlett Presto, RPH   2 g at 02/22/15 1321  . clopidogrel (PLAVIX) tablet 75 mg  75 mg Oral Daily Catarina Hartshorn, MD   75 mg at 02/22/15 1530  . Darbepoetin Alfa (ARANESP) injection 40 mcg  40 mcg Intravenous Q Wed-HD Lenny Pastel, PA-C   40 mcg at 02/20/15 1402  . dorzolamide-timolol (COSOPT) 22.3-6.8 MG/ML ophthalmic solution 1 drop  1 drop Both Eyes TID Gwenyth Bender, NP   1 drop at 02/22/15 1531  . fentaNYL (DURAGESIC - dosed mcg/hr) patch 75 mcg  75 mcg Transdermal Q72H Gwenyth Bender, NP   75 mcg at 02/20/15 1027  . ferric gluconate (NULECIT) 62.5 mg in sodium chloride 0.9 % 100 mL IVPB  62.5 mg Intravenous Q Wed-HD Lenny Pastel, PA-C   62.5 mg at 02/20/15 1518  . HYDROmorphone (DILAUDID) injection 0.5 mg  0.5 mg Intravenous Q4H PRN Gwenyth Bender, NP   0.5 mg at 02/21/15 0453  . labetalol (NORMODYNE,TRANDATE) injection 10 mg  10 mg Intravenous Q4H PRN Lesle Chris Black, NP      . latanoprost (XALATAN) 0.005 % ophthalmic solution 1 drop  1 drop Both Eyes QHS Gwenyth Bender, NP   1 drop at 02/21/15 2206  . lidocaine (XYLOCAINE) 2 % jelly 1 application  1 application Topical Once PRN Serena Colonel, MD      . lidocaine (XYLOCAINE) 4 % external solution 0-50 mL  0-50 mL Topical Once PRN Serena Colonel, MD      . lidocaine-EPINEPHrine (XYLOCAINE-EPINEPHrine) 1 %-1:200000 (PF) injection 0-30 mL  0-30 mL Intradermal Once PRN Serena Colonel, MD      . LORazepam (ATIVAN) injection 0.5 mg  0.5 mg Intravenous Q8H PRN Gwenyth Bender, NP      . multivitamin (RENA-VIT) tablet 1 tablet  1 tablet Oral QHS Lenny Pastel, PA-C   1 tablet at 02/21/15 2206  . nitroGLYCERIN (NITROSTAT) SL tablet 0.4 mg  0.4 mg Sublingual Q5 min PRN Gwenyth Bender, NP      . ondansetron [facility]) tablet 4 mg  4 mg Oral Q6H PRN Gwenyth Bender, NP       Or  . ondansetron [facility]) injection 4 mg  4 mg Intravenous Q6H PRN Lesle Chris Black, NP       . oxymetazoline (AFRIN) 0.05 % nasal spray 1 spray  1 spray Each Nare Once PRN Serena Colonel, MD      . silver nitrate applicators applicator 10 Stick  10 Stick Topical Once PRN Serena Colonel, MD      . sodium chloride 0.9 % injection 3 mL  3  mL Intravenous Q12H Gwenyth Bender, NP   3 mL at 02/22/15 1532  . sodium chloride 0.9 % injection 3 mL  3 mL Intravenous Q12H Gwenyth Bender, NP   3 mL at 02/21/15 2206  . sodium chloride 0.9 % injection 3 mL  3 mL Intravenous PRN Lesle Chris Black, NP      . sulindac (CLINORIL) tablet 200 mg  200 mg Oral BID Gwenyth Bender, NP   200 mg at 02/22/15 1531  . TRIPLE ANTIBIOTIC [phone] OINT 1 application  1 application Apply externally Once PRN Serena Colonel, MD      . vancomycin (VANCOCIN) IVPB 1000 mg/200 mL premix  1,000 mg Intravenous Q M,W,F-HD Scarlett Presto, RPH   1,000 mg at 02/22/15 1324   Do not use this list as official medication orders. Please verify with discharge summary.  Discharge Medications:   Medication List    STOP taking these medications        b complex vitamins tablet     baclofen 10 MG tablet  Commonly known as:  LIORESAL     famotidine 20 MG tablet  Commonly known as:  PEPCID     loperamide 2 MG tablet  Commonly known as:  IMODIUM A-D     LORazepam 0.5 MG tablet  Commonly known as:  ATIVAN     oxyCODONE 5 MG immediate release tablet  Commonly known as:  Oxy IR/ROXICODONE     sodium bicarbonate 650 MG tablet      TAKE these medications        aspirin EC 81 MG tablet  Take 1 tablet (81 mg total) by mouth daily.     atorvastatin 80 MG tablet  Commonly known as:  LIPITOR  Take 1 tablet (80 mg total) by mouth daily at 6 PM.     B-complex with vitamin C tablet  Take 1 tablet by mouth at bedtime.     calcium carbonate 500 MG chewable tablet  Commonly known as:  TUMS - dosed in mg elemental calcium  Chew 3 tablets by mouth 3 (three) times daily.     clopidogrel 75 MG tablet  Commonly known as:  PLAVIX  Take 1  tablet (75 mg total) by mouth daily.     colchicine 0.6 MG tablet  Take 0.6 mg by mouth every 12 (twelve) hours as needed (gout).     dorzolamide-timolol 22.3-6.8 MG/ML ophthalmic solution  Commonly known as:  COSOPT  Place 1 drop into both eyes 3 (three) times daily.     escitalopram 10 MG tablet  Commonly known as:  LEXAPRO  Take 10 mg by mouth daily.     feeding supplement (PRO-STAT SUGAR FREE 64) Liqd  Take 30 mLs by mouth 2 (two) times daily.     fentaNYL 50 MCG/HR  Commonly known as:  DURAGESIC - dosed mcg/hr  Apply one patch topically every 72 hours for pain. Remove old patch     gabapentin 100 MG capsule  Commonly known as:  NEURONTIN  Take 100 mg by mouth every 12 (twelve) hours as needed (neuropathy).     latanoprost 0.005 % ophthalmic solution  Commonly known as:  XALATAN  Place 1 drop into both eyes at bedtime.     Menthol (Topical Analgesic) 4 % Gel  Apply 1 application topically every 6 (six) hours as needed (pain). Apply to shoulders/lower back     multivitamin Tabs tablet  Take 1 tablet by mouth at bedtime.  nitroGLYCERIN 0.4 MG SL tablet  Commonly known as:  NITROSTAT  Place 1 tablet (0.4 mg total) under the tongue every 5 (five) minutes as needed for chest pain.     sulindac 200 MG tablet  Commonly known as:  CLINORIL  Take 200 mg by mouth 2 (two) times daily.        Relevant Imaging Results:  Relevant Lab Results:  Recent Labs    Additional Information    Cristobal Goldmann, LCSW

## 2015-02-22 NOTE — Procedures (Signed)
I have personally attended this patient's dialysis session.   Pre weight 107.1 with goal of 4 liters so will have new (lower) EDW at discharge. K 3.9 change bath to 4K L thigh AVG 400 OLC green  Camille Bal, MD [facility] [phone] Pager 02/22/2015, 10:54 AM

## 2015-02-22 NOTE — Clinical Social Work Note (Signed)
Patient discharging back to [facility] today. Patient will be transported by ambulance. Mr. Lorette wife contacted and advised that ambulance transport arranged.   Genelle Bal, MSW, LCSW Licensed Clinical Social Worker Clinical Social Work Department [employer] [phone]

## 2015-02-23 LAB — CULTURE, BLOOD (ROUTINE X 2)
CULTURE: NO GROWTH
Culture: NO GROWTH

## 2015-02-25 NOTE — Patient Outreach (Addendum)
[facility]) Care Management  02/25/2015  SOURISH MONIER 1955/09/22 [id number]   Referral from Charlesetta Shanks, RN to assign Community RN, assigned Elliot Cousin, RN.  Also assigned SW for follow up at SNF, [employer] [employer], LCSW assigned.  Thanks, Corrie Mckusick. Sharlee Blew [facility] Care Management [facility] CM Assistant Phone: [phone] Fax: [phone]

## 2015-03-04 ENCOUNTER — Other Ambulatory Visit: Payer: Self-pay | Admitting: *Deleted

## 2015-03-04 ENCOUNTER — Encounter: Payer: Self-pay | Admitting: *Deleted

## 2015-03-04 NOTE — Patient Outreach (Signed)
[name]) Care [name]  [name] Social Work  03/04/2015  [name] [birth year] [id number]  Subjective:    CSW was able to converse with patient's mother, [name], who reports that she is patient's Press photographer.  According to [name], no social work needs are identified at present, as patient will continue to reside at [name], for long-term care placement.  Objective:   CSW agreed to complete initial assessment with patient's mother, [name], to ensure no social work needs are identified.  Current Medications:  Current Outpatient Prescriptions  Medication Sig Dispense Refill  . Amino Acids-Protein Hydrolys (FEEDING SUPPLEMENT, PRO-STAT SUGAR FREE 64,) LIQD Take 30 mLs by mouth 2 (two) times daily.    Marland Kitchen aspirin 81 MG tablet Take 1 tablet (81 mg total) by mouth daily.    Marland Kitchen atorvastatin (LIPITOR) 80 MG tablet Take 1 tablet (80 mg total) by mouth daily at 6 PM. 30 tablet 11  . B Complex-C (B-COMPLEX WITH VITAMIN C) tablet Take 1 tablet by mouth at bedtime.    . calcium carbonate (TUMS - DOSED IN MG ELEMENTAL CALCIUM) 500 MG chewable tablet Chew 3 tablets by mouth 3 (three) times daily.    . clopidogrel (PLAVIX) 75 MG tablet Take 1 tablet (75 mg total) by mouth daily. 30 tablet 0  . colchicine 0.6 MG tablet Take 0.6 mg by mouth every 12 (twelve) hours as needed (gout).     . dorzolamide-timolol (COSOPT) 22.3-6.8 MG/ML ophthalmic solution Place 1 drop into both eyes 3 (three) times daily.    Marland Kitchen escitalopram (LEXAPRO) 10 MG tablet Take 10 mg by mouth daily.    . fentaNYL (DURAGESIC - DOSED MCG/HR) 50 MCG/HR Apply one patch topically every 72 hours for pain. Remove old patch 10 patch 0  . gabapentin (NEURONTIN) 100 MG capsule Take 100 mg by mouth every 12 (twelve) hours as needed (neuropathy).     . latanoprost (XALATAN) 0.005 % ophthalmic solution Place 1 drop into both eyes at bedtime.    2  . Menthol, Topical Analgesic, 4 % GEL Apply 1 application topically every 6 (six) hours as needed (pain). Apply to shoulders/lower back    . multivitamin (RENA-VIT) TABS tablet Take 1 tablet by mouth at bedtime. 30 tablet 1  . nitroGLYCERIN (NITROSTAT) 0.4 MG SL tablet Place 1 tablet (0.4 mg total) under the tongue every 5 (five) minutes as needed for chest pain. 25 tablet 3  . sulindac (CLINORIL) 200 MG tablet Take 200 mg by mouth 2 (two) times daily.     No current facility-administered medications for this visit.    Functional Status:  In your present state of health, do you have any difficulty performing the following activities: 03/04/2015 01/01/2015  Hearing? N N  Vision? N N  Difficulty concentrating or making decisions? Y N  Walking or climbing stairs? Y Y  Dressing or bathing? Y Y  Doing errands, shopping? Y -  Conservation officer, nature and eating ? Y -  Using the Toilet? Y -  In the past six months, have you accidently leaked urine? N -  Do you have problems with loss of bowel control? N -  Managing your Medications? Y -  Managing your Finances? Y -  Housekeeping or managing your Housekeeping? Y -    Fall/Depression Screening:  PHQ 2/9 Scores 03/04/2015 09/26/2014 08/25/2011  PHQ - 2 Score 2 0 0  PHQ- 9 Score 6 - -  Assessment:   CSW was able to make initial contact with patient's mother, also patient's Healthcare Power of [name], [name] today to perform phone assessment on patient, as well as assess and assist with social work needs and services.  CSW introduced self, explained role and types of services provided through [name] ([name]).  CSW then explained the reason for the call, indicating that CSW is available and willing to assist patient with any possible discharge planning needs and services, upon release from [name], [place] where patient currently resides.  CSW obtained  two HIPAA compliant identifiers on patient from [name], which included patient's name and date of birth. According to [name], patient was residing at [name], prior to being admitted into [name] during this last hospitalization.  [name] went on to say that arrangements have been made for patient to reside at the skilled nursing facility for long-term care.  [name] was instrumental in getting patient's Adult Medicaid coverage switched to Long-Term Care Medicaid coverage, through patient's Medicaid Case Worker with the [name].  [name] admitted that she was no longer able to adequately care for patient at home, due to all of patient's co-morbidities.  [name] reports being happy with the care that patient is receiving at the facility.  No social work needs identified at this time.   Plan:   CSW will perform a case closure on patient, as all goals of treatment have been met from social work standpoint and no additional social work needs have been identified at this time. CSW will notify [name] Liaison with [name], of CSW's plans to close patient's case. CSW will fax a correspondence letter to patient's Primary Care Physician, [name] to ensure that [name] is aware of patient's current living arrangements.   CSW will submit a case closure request to [name], Care [name] Assistant with [name], in the form of a message.    [name], [name], [name], [name]  Licensed Education officer, environmental Health System  Mailing [place] [address], [place], [place] [address] Physical Address-[address], [place], [place] [address] Toll Free Main # [phone] Fax # [phone] Cell # [phone]  Fax # [phone]   [name]

## 2015-03-05 NOTE — Patient Outreach (Signed)
[facility]) Care Management  03/05/2015  [name] 10-04-1955 [id number]   Notification from Danford Bad, LCSW to close case due to patient will reside at long term Skilled Nursing Facility.  Thanks, Corrie Mckusick. Sharlee Blew [facility] Care Management [facility] CM Assistant Phone: [phone] Fax: [phone]

## 2015-03-05 NOTE — Patient Outreach (Signed)
[facility]) Care Management  03/05/2015  [name] April 13, 1956 [id number]   Erroneous encounter.  Created in error.  Danford Bad, BSW, MSW, LCSW  Licensed Restaurant manager, fast food Health System  Mailing [place] [address] [place], [place] [address] Physical Address[address], [place], [place] [address] Toll Free Main # [phone] Fax # [phone] Cell # [phone]  Fax # [phone]  [name]

## 2015-03-27 ENCOUNTER — Emergency Department (HOSPITAL_COMMUNITY): Payer: Medicare Other

## 2015-03-27 ENCOUNTER — Inpatient Hospital Stay (HOSPITAL_COMMUNITY)
Admission: EM | Admit: 2015-03-27 | Discharge: 2015-04-02 | DRG: 189 | Disposition: A | Discharge status: Hospice | Payer: Medicare Other | Attending: Internal Medicine | Admitting: Internal Medicine

## 2015-03-27 ENCOUNTER — Encounter (HOSPITAL_COMMUNITY): Payer: Self-pay | Admitting: *Deleted

## 2015-03-27 DIAGNOSIS — Z955 Presence of coronary angioplasty implant and graft: Secondary | ICD-10-CM | POA: Diagnosis not present

## 2015-03-27 DIAGNOSIS — Z7982 Long term (current) use of aspirin: Secondary | ICD-10-CM | POA: Diagnosis not present

## 2015-03-27 DIAGNOSIS — M1990 Unspecified osteoarthritis, unspecified site: Secondary | ICD-10-CM | POA: Diagnosis present

## 2015-03-27 DIAGNOSIS — I959 Hypotension, unspecified: Secondary | ICD-10-CM | POA: Diagnosis present

## 2015-03-27 DIAGNOSIS — Z6824 Body mass index (BMI) 24.0-24.9, adult: Secondary | ICD-10-CM

## 2015-03-27 DIAGNOSIS — R0600 Dyspnea, unspecified: Secondary | ICD-10-CM | POA: Diagnosis not present

## 2015-03-27 DIAGNOSIS — F17210 Nicotine dependence, cigarettes, uncomplicated: Secondary | ICD-10-CM | POA: Diagnosis present

## 2015-03-27 DIAGNOSIS — D6489 Other specified anemias: Secondary | ICD-10-CM | POA: Diagnosis present

## 2015-03-27 DIAGNOSIS — L89152 Pressure ulcer of sacral region, stage 2: Secondary | ICD-10-CM | POA: Diagnosis present

## 2015-03-27 DIAGNOSIS — K8020 Calculus of gallbladder without cholecystitis without obstruction: Secondary | ICD-10-CM | POA: Diagnosis present

## 2015-03-27 DIAGNOSIS — Z7902 Long term (current) use of antithrombotics/antiplatelets: Secondary | ICD-10-CM | POA: Diagnosis not present

## 2015-03-27 DIAGNOSIS — Z96649 Presence of unspecified artificial hip joint: Secondary | ICD-10-CM | POA: Diagnosis present

## 2015-03-27 DIAGNOSIS — Z79899 Other long term (current) drug therapy: Secondary | ICD-10-CM | POA: Diagnosis not present

## 2015-03-27 DIAGNOSIS — Z66 Do not resuscitate: Secondary | ICD-10-CM | POA: Diagnosis present

## 2015-03-27 DIAGNOSIS — G8929 Other chronic pain: Secondary | ICD-10-CM | POA: Diagnosis present

## 2015-03-27 DIAGNOSIS — D649 Anemia, unspecified: Secondary | ICD-10-CM | POA: Diagnosis present

## 2015-03-27 DIAGNOSIS — R7881 Bacteremia: Secondary | ICD-10-CM | POA: Diagnosis present

## 2015-03-27 DIAGNOSIS — E119 Type 2 diabetes mellitus without complications: Secondary | ICD-10-CM

## 2015-03-27 DIAGNOSIS — E1122 Type 2 diabetes mellitus with diabetic chronic kidney disease: Secondary | ICD-10-CM | POA: Diagnosis present

## 2015-03-27 DIAGNOSIS — M549 Dorsalgia, unspecified: Secondary | ICD-10-CM | POA: Diagnosis present

## 2015-03-27 DIAGNOSIS — I10 Essential (primary) hypertension: Secondary | ICD-10-CM | POA: Diagnosis present

## 2015-03-27 DIAGNOSIS — K219 Gastro-esophageal reflux disease without esophagitis: Secondary | ICD-10-CM | POA: Diagnosis present

## 2015-03-27 DIAGNOSIS — Y95 Nosocomial condition: Secondary | ICD-10-CM | POA: Diagnosis present

## 2015-03-27 DIAGNOSIS — R627 Adult failure to thrive: Secondary | ICD-10-CM | POA: Diagnosis present

## 2015-03-27 DIAGNOSIS — B1920 Unspecified viral hepatitis C without hepatic coma: Secondary | ICD-10-CM | POA: Diagnosis present

## 2015-03-27 DIAGNOSIS — Z992 Dependence on renal dialysis: Secondary | ICD-10-CM | POA: Diagnosis not present

## 2015-03-27 DIAGNOSIS — I5032 Chronic diastolic (congestive) heart failure: Secondary | ICD-10-CM | POA: Diagnosis present

## 2015-03-27 DIAGNOSIS — L8990 Pressure ulcer of unspecified site, unspecified stage: Secondary | ICD-10-CM | POA: Insufficient documentation

## 2015-03-27 DIAGNOSIS — Z7189 Other specified counseling: Secondary | ICD-10-CM | POA: Insufficient documentation

## 2015-03-27 DIAGNOSIS — K7290 Hepatic failure, unspecified without coma: Secondary | ICD-10-CM | POA: Diagnosis present

## 2015-03-27 DIAGNOSIS — J189 Pneumonia, unspecified organism: Secondary | ICD-10-CM

## 2015-03-27 DIAGNOSIS — I132 Hypertensive heart and chronic kidney disease with heart failure and with stage 5 chronic kidney disease, or end stage renal disease: Secondary | ICD-10-CM | POA: Diagnosis present

## 2015-03-27 DIAGNOSIS — J341 Cyst and mucocele of nose and nasal sinus: Secondary | ICD-10-CM | POA: Diagnosis present

## 2015-03-27 DIAGNOSIS — I509 Heart failure, unspecified: Secondary | ICD-10-CM

## 2015-03-27 DIAGNOSIS — N186 End stage renal disease: Secondary | ICD-10-CM | POA: Diagnosis not present

## 2015-03-27 DIAGNOSIS — I252 Old myocardial infarction: Secondary | ICD-10-CM

## 2015-03-27 DIAGNOSIS — R945 Abnormal results of liver function studies: Secondary | ICD-10-CM | POA: Diagnosis present

## 2015-03-27 DIAGNOSIS — J9601 Acute respiratory failure with hypoxia: Principal | ICD-10-CM | POA: Diagnosis present

## 2015-03-27 DIAGNOSIS — R7989 Other specified abnormal findings of blood chemistry: Secondary | ICD-10-CM | POA: Diagnosis not present

## 2015-03-27 DIAGNOSIS — G9340 Encephalopathy, unspecified: Secondary | ICD-10-CM

## 2015-03-27 DIAGNOSIS — I2510 Atherosclerotic heart disease of native coronary artery without angina pectoris: Secondary | ICD-10-CM | POA: Diagnosis present

## 2015-03-27 DIAGNOSIS — Z515 Encounter for palliative care: Secondary | ICD-10-CM | POA: Insufficient documentation

## 2015-03-27 DIAGNOSIS — R778 Other specified abnormalities of plasma proteins: Secondary | ICD-10-CM

## 2015-03-27 DIAGNOSIS — R748 Abnormal levels of other serum enzymes: Secondary | ICD-10-CM

## 2015-03-27 HISTORY — DX: Cyst and mucocele of nose and nasal sinus: J34.1

## 2015-03-27 LAB — COMPREHENSIVE METABOLIC PANEL
ALT: 26 U/L (ref 17–63)
AST: 93 U/L — ABNORMAL HIGH (ref 15–41)
Albumin: 2.3 g/dL — ABNORMAL LOW (ref 3.5–5.0)
Alkaline Phosphatase: 260 U/L — ABNORMAL HIGH (ref 38–126)
Anion gap: 10 (ref 5–15)
BILIRUBIN TOTAL: 7.8 mg/dL — AB (ref 0.3–1.2)
BUN: 29 mg/dL — AB (ref 6–20)
CHLORIDE: 95 mmol/L — AB (ref 101–111)
CO2: 32 mmol/L (ref 22–32)
CREATININE: 4.86 mg/dL — AB (ref 0.61–1.24)
Calcium: 8.7 mg/dL — ABNORMAL LOW (ref 8.9–10.3)
GFR, EST AFRICAN AMERICAN: 14 mL/min — AB (ref >60)
GFR, EST NON AFRICAN AMERICAN: 12 mL/min — AB (ref >60)
Glucose, Bld: 104 mg/dL — ABNORMAL HIGH (ref 65–99)
POTASSIUM: 4.5 mmol/L (ref 3.5–5.1)
Sodium: 137 mmol/L (ref 135–145)
TOTAL PROTEIN: 7.4 g/dL (ref 6.5–8.1)

## 2015-03-27 LAB — CBC WITH DIFFERENTIAL/PLATELET
BASOS ABS: 0 10*3/uL (ref 0.0–0.1)
Basophils Relative: 0 %
EOS ABS: 0 10*3/uL (ref 0.0–0.7)
EOS PCT: 0 %
HCT: 31.5 % — ABNORMAL LOW (ref 39.0–52.0)
HEMOGLOBIN: 10.3 g/dL — AB (ref 13.0–17.0)
LYMPHS ABS: 0.4 10*3/uL — AB (ref 0.7–4.0)
Lymphocytes Relative: 3 %
MCH: 27.8 pg (ref 26.0–34.0)
MCHC: 32.7 g/dL (ref 30.0–36.0)
MCV: 84.9 fL (ref 78.0–100.0)
Monocytes Absolute: 1 10*3/uL (ref 0.1–1.0)
Monocytes Relative: 6 %
NEUTROS PCT: 91 %
Neutro Abs: 13.7 10*3/uL — ABNORMAL HIGH (ref 1.7–7.7)
PLATELETS: 212 10*3/uL (ref 150–400)
RBC: 3.71 MIL/uL — AB (ref 4.22–5.81)
RDW: 19.7 % — ABNORMAL HIGH (ref 11.5–15.5)
WBC: 15 10*3/uL — AB (ref 4.0–10.5)

## 2015-03-27 LAB — I-STAT TROPONIN, ED: TROPONIN I, POC: 1.24 ng/mL — AB (ref 0.00–0.08)

## 2015-03-27 LAB — I-STAT CG4 LACTIC ACID, ED: Lactic Acid, Venous: 2.26 mmol/L (ref 0.5–2.0)

## 2015-03-27 MED ORDER — LATANOPROST 0.005 % OP SOLN
1.0000 [drp] | Freq: Every day | OPHTHALMIC | Status: DC
  Administered 2015-03-28 – 2015-04-01 (×6): 1 [drp] via OPHTHALMIC
  Filled 2015-03-27: qty 2.5

## 2015-03-27 MED ORDER — SULINDAC 200 MG PO TABS
200.0000 mg | ORAL_TABLET | Freq: Two times a day (BID) | ORAL | Status: DC
  Administered 2015-03-28 – 2015-04-02 (×10): 200 mg via ORAL
  Filled 2015-03-27 (×14): qty 1

## 2015-03-27 MED ORDER — SODIUM CHLORIDE 0.9 % IV SOLN
125.0000 mg | INTRAVENOUS | Status: DC
  Administered 2015-03-29: 125 mg via INTRAVENOUS
  Filled 2015-03-27 (×2): qty 10

## 2015-03-27 MED ORDER — DARBEPOETIN ALFA 60 MCG/0.3ML IJ SOSY
PREFILLED_SYRINGE | INTRAMUSCULAR | Status: AC
  Filled 2015-03-27: qty 0.3

## 2015-03-27 MED ORDER — ASPIRIN EC 81 MG PO TBEC
81.0000 mg | DELAYED_RELEASE_TABLET | Freq: Every day | ORAL | Status: DC
  Administered 2015-03-28 – 2015-04-01 (×4): 81 mg via ORAL
  Filled 2015-03-27 (×4): qty 1

## 2015-03-27 MED ORDER — PIPERACILLIN-TAZOBACTAM IN DEX 2-0.25 GM/50ML IV SOLN
2.2500 g | Freq: Three times a day (TID) | INTRAVENOUS | Status: DC
Start: 2015-03-27 — End: 2015-03-27
  Filled 2015-03-27 (×3): qty 50

## 2015-03-27 MED ORDER — IPRATROPIUM-ALBUTEROL 0.5-2.5 (3) MG/3ML IN SOLN
3.0000 mL | Freq: Four times a day (QID) | RESPIRATORY_TRACT | Status: DC
  Administered 2015-03-28 (×4): 3 mL via RESPIRATORY_TRACT
  Filled 2015-03-27 (×4): qty 3

## 2015-03-27 MED ORDER — VANCOMYCIN HCL 10 G IV SOLR
2000.0000 mg | Freq: Once | INTRAVENOUS | Status: AC
  Administered 2015-03-27: 2000 mg via INTRAVENOUS
  Filled 2015-03-27: qty 2000

## 2015-03-27 MED ORDER — CALCIUM CARBONATE ANTACID 500 MG PO CHEW
3.0000 | CHEWABLE_TABLET | Freq: Three times a day (TID) | ORAL | Status: DC
  Administered 2015-03-28 – 2015-04-02 (×15): 600 mg via ORAL
  Filled 2015-03-27 (×12): qty 3

## 2015-03-27 MED ORDER — PIPERACILLIN-TAZOBACTAM 3.375 G IVPB 30 MIN
3.3750 g | Freq: Once | INTRAVENOUS | Status: AC
  Administered 2015-03-27: 3.375 g via INTRAVENOUS
  Filled 2015-03-27: qty 50

## 2015-03-27 MED ORDER — PRO-STAT SUGAR FREE PO LIQD
30.0000 mL | Freq: Two times a day (BID) | ORAL | Status: DC
  Administered 2015-03-28 – 2015-04-02 (×12): 30 mL via ORAL
  Filled 2015-03-27 (×12): qty 30

## 2015-03-27 MED ORDER — COLCHICINE 0.6 MG PO TABS
0.6000 mg | ORAL_TABLET | Freq: Two times a day (BID) | ORAL | Status: DC | PRN
  Filled 2015-03-27: qty 1

## 2015-03-27 MED ORDER — NALOXONE HCL 0.4 MG/ML IJ SOLN
0.1000 mg | Freq: Once | INTRAMUSCULAR | Status: AC
  Administered 2015-03-27: 0.1 mg via INTRAVENOUS
  Filled 2015-03-27: qty 1

## 2015-03-27 MED ORDER — B COMPLEX-C PO TABS
1.0000 | ORAL_TABLET | Freq: Every day | ORAL | Status: DC
  Administered 2015-03-28 – 2015-03-30 (×4): 1 via ORAL
  Filled 2015-03-27 (×6): qty 1

## 2015-03-27 MED ORDER — CLOPIDOGREL BISULFATE 75 MG PO TABS
75.0000 mg | ORAL_TABLET | Freq: Every day | ORAL | Status: DC
  Administered 2015-03-28 – 2015-04-02 (×5): 75 mg via ORAL
  Filled 2015-03-27 (×5): qty 1

## 2015-03-27 MED ORDER — FENTANYL 25 MCG/HR TD PT72
50.0000 ug | MEDICATED_PATCH | TRANSDERMAL | Status: DC
  Administered 2015-03-28: 50 ug via TRANSDERMAL
  Filled 2015-03-27: qty 2

## 2015-03-27 MED ORDER — RENA-VITE PO TABS
1.0000 | ORAL_TABLET | Freq: Every day | ORAL | Status: DC
  Administered 2015-03-28 – 2015-03-30 (×4): 1 via ORAL
  Filled 2015-03-27 (×4): qty 1

## 2015-03-27 MED ORDER — DEXTROSE 5 % IV SOLN
2.0000 g | Freq: Once | INTRAVENOUS | Status: AC
  Administered 2015-03-28: 2 g via INTRAVENOUS
  Filled 2015-03-27: qty 2

## 2015-03-27 MED ORDER — ATORVASTATIN CALCIUM 80 MG PO TABS
80.0000 mg | ORAL_TABLET | Freq: Every day | ORAL | Status: DC
  Administered 2015-03-29 – 2015-03-30 (×2): 80 mg via ORAL
  Filled 2015-03-27 (×4): qty 1

## 2015-03-27 MED ORDER — DORZOLAMIDE HCL-TIMOLOL MAL 2-0.5 % OP SOLN
1.0000 [drp] | Freq: Three times a day (TID) | OPHTHALMIC | Status: DC
  Administered 2015-03-28 – 2015-04-02 (×18): 1 [drp] via OPHTHALMIC
  Filled 2015-03-27 (×2): qty 10

## 2015-03-27 MED ORDER — DARBEPOETIN ALFA 60 MCG/0.3ML IJ SOSY
60.0000 ug | PREFILLED_SYRINGE | INTRAMUSCULAR | Status: DC
  Administered 2015-03-27: 60 ug via INTRAVENOUS

## 2015-03-27 NOTE — Progress Notes (Addendum)
ANTIBIOTIC CONSULT NOTE - INITIAL  Pharmacy Consult for vancomycin + zosyn Indication: pneumonia  No Known Allergies  Patient Measurements: Height: 5\' 9"  (175.3 cm) Weight: 227 lb (102.967 kg) IBW/kg (Calculated) : 70.7  Vital Signs: Temp: 99.4 F (37.4 C) (11/30 1356) BP: 121/87 mmHg (11/30 1515) Pulse Rate: 91 (11/30 1515) Intake/Output from previous day:   Intake/Output from this shift:    Labs:  Recent Labs  03/27/15 1356  WBC 15.0*  HGB 10.3*  PLT 212  CREATININE 4.86*   Estimated Creatinine Clearance: 19.4 mL/min (by C-G formula based on Cr of 4.86). No results for input(s): VANCOTROUGH, VANCOPEAK, VANCORANDOM, GENTTROUGH, GENTPEAK, GENTRANDOM, TOBRATROUGH, TOBRAPEAK, TOBRARND, AMIKACINPEAK, AMIKACINTROU, AMIKACIN in the last 72 hours.   Microbiology: No results found for this or any previous visit (from the past 720 hour(s)).  Medical History: Past Medical History  Diagnosis Date  . Anxiety   . Back pain   . GERD (gastroesophageal reflux disease)   . Peripheral neuropathy (HCC)   . Arthritis   . Chronic diastolic CHF (congestive heart failure) (HCC)     a. 04/2013 Echo: EF nl, no rwma, mild LVH.  Marland Kitchen Active smoker   . ESRD on hemodialysis (HCC)     [name] 4 days per week on M-Tu-Wed and Fri.  Started HD in 1998 and has been on HD initially at [facility], then went to [facility], then in [name], then to [facility] and now is at [facility] for last 10 years.  Has L thigh AVG.     . Diabetes mellitus     diet controlled  . 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 PONV (postoperative nausea and vomiting)   . Hypertension   . Bell palsy   . Carpal tunnel syndrome     bilateral  . Palpitations   . Headache(784.0)     Hx: of Migraines  . CAD (coronary artery disease)     a. 04/2013 Ant STEMI/PCI: LM nl, LAD 95p (3.0x18 Xience DES), D1/2/3 small, LCX 80ost, RI 20p, RCA 155m CTO (unsuccessful PCI), EF 55%, mod basal inf HK.  . S/P coronary  artery stent placement 05/21/2013    Xience Alpine Medtronic conditional 5 @ 1.5 and 3T    Assessment: 59 yo m with ESRD presenting to the ED on 11/30 with AMS.  Pharmacy is consulted to dose vancomycin and zosyn for possible pneumonia.  Pt to get HD tonight. Wbc 15, tmax 99.4.  Vanc 11/30 >> Zosyn 11/30 >>  11//30 BCx: sent  Goal of Therapy:  Vancomycin trough level 15-20 mcg/ml  Plan:  Vancomycin 2,000 mg IV load x 1 F/u HD schedule to determine further dosing Zosyn 3.375 gm IV load x 1 Zosyn 2.25 gm IV q8h  Monitor HD plans, cbc, cx, clinical course  Cassie L. Roseanne Reno, PharmD PGY2 Infectious Diseases Pharmacy Resident Pager: [phone] 03/27/2015 5:22 PM  Addendum: Laqueta Jean changed to Ceftazidime. Pt received emergent HD (BFR 350 x 4hrs) yesterday night. Vancomycin load and Ceftazidime 2gm given post-HD on 11/30. RN charted Vanc as given pre-HD but she stopped it before HD and then gave it right after HD.  Plan: Ceftazidime 1gm IV daily x 8 days total Vancomycin 1gm IV with HD x 8 days total Will f/u HD schedule and tolerance  Christoper Fabian, PharmD, BCPS Clinical pharmacist, pager [phone] 03/28/2015 5:32 AM

## 2015-03-27 NOTE — ED Notes (Signed)
Pt here via [facility] from dialysis center who called EMS in reference to pt being altered and hypotensive at 98/56.  EMS unable to give a baseline mental status. EMS reports O2 sat of 85% upon arrival. Pt was placed on NRB and maintained sat of 99%. Pt only completed 20 mins of treatment. Pt lethargic but arouseable with stimulation.

## 2015-03-27 NOTE — Progress Notes (Signed)
Left thigh AV graft deaccessed, pressure dressing apllied gor 30 min. No active bleeding noted, dressing intact.

## 2015-03-27 NOTE — H&P (Signed)
[facility] History and Physical  [name] [name] DOB: [birth year] DOA: 03/27/2015  Referring physician: Rubin Payor PCP: Dyke Maes, MD   Chief Complaint: ams/HCAP/acute respiratory failure  HPI: [name] is a 59 y.o. male with a past medical history of CHF, end-stage renal disease on dialysis Monday Wednesday and Friday, diabetes diet-controlled, hypertension presents to the emergency department from dialysis with the chief complaint of acute altered mental status and hypotension. Initial evaluation in the emergency department reveals acute respiratory failure likely related to healthcare associated pneumonia, elevated troponin.  Information is obtained from the patient somewhat unreliable due to acute encephalopathy. Was at dialysis and sent to the emergency department secondary to acute encephalopathy and hypotension. He underwent 20 minutes of dialysis. Reportedly blood pressure 98/56 when EMS arrived to dialysis but his oxygen saturation level was 85%. He was placed on nonrebreather oxygen saturation level improved to 99%. Reports mild cough over the last several days. Denies fever chills nausea vomiting. Reports compliance with medications and dialysis. Denies chest pain, palpitation shortness of breath headache, dizziness syncope or near-syncope.   In the emergency department CBC with WBCs of 15.0 hemoglobin 10.3, comprehensive metabolic panel significant for chloride 95 creatinine 4.86 calcium 8.7, AST 93 alkaline phosphatase 260 total bili 7.8, initial troponin 1.24, reticulocyte acid 2.26. Chest x-ray reveals Right base infiltrate. Lungs elsewhere clear. Stable cardiac prominence. 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 CT of the head without acute abnormality  In the emergency department he is afebrile hemodynamically stable not hypoxic. He is provided with Vanco and Zosyn as well as  Narcan.    Review of Systems:  Unable to obtain due to acute encephalopathy Past Medical History  Diagnosis Date  . Anxiety   . Back pain   . GERD (gastroesophageal reflux disease)   . Peripheral neuropathy (HCC)   . Arthritis   . Chronic diastolic CHF (congestive heart failure) (HCC)     a. 04/2013 Echo: EF nl, no rwma, mild LVH.  Marland Kitchen Active smoker   . ESRD on hemodialysis (HCC)     [name] 4 days per week on M-Tu-Wed and Fri.  Started HD in 1998 and has been on HD initially at [facility], then went to [facility], then in [facility], then to [facility] and now is at [facility] for last 10 years.  Has L thigh AVG.     . Diabetes mellitus     diet controlled  . 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 PONV (postoperative nausea and vomiting)   . Hypertension   . Bell palsy   . Carpal tunnel syndrome     bilateral  . Palpitations   . Headache(784.0)     Hx: of Migraines  . CAD (coronary artery disease)     a. 04/2013 Ant STEMI/PCI: LM nl, LAD 95p (3.0x18 Xience DES), D1/2/3 small, LCX 80ost, RI 20p, RCA 113m CTO (unsuccessful PCI), EF 55%, mod basal inf HK.  . S/P coronary artery stent placement 05/21/2013    Xience Alpine Medtronic conditional 5 @ 1.5 and 3T    Past Surgical History  Procedure Laterality Date  . Total hip arthroplasty    . Thyroidectomy    . Tooth extraction    . Mandible fracture surgery    . Dg av dialysis graft declot or    . Insertion of dialysis catheter  03/28/2011  Procedure: INSERTION OF DIALYSIS CATHETER;  Surgeon: Pryor Ochoa, MD;  Location: [facility] OR;  Service: Vascular;  Laterality: Left;  . Av fistula placement  03/31/2011    Procedure: INSERTION OF ARTERIOVENOUS (AV) GORE-TEX GRAFT THIGH;  Surgeon: Sherren Kerns, MD;  Location: [facility] OR;  Service: Vascular;  Laterality: Right;  redo right thigh arteriovenous gortex graft using gore-tex stretch 6mm x 70cm  . Thrombectomy w/ embolectomy  06/02/2011    Procedure: THROMBECTOMY  ARTERIOVENOUS GORE-TEX GRAFT;  Surgeon: Chuck Hint, MD;  Location: [facility] OR;  Service: Vascular;  Laterality: Right;  Thrombectomy right thigh arteriovenous gortex graft;  revision by  replacement of large portion of graft with 7mm gore-tex   . Insertion of dialysis catheter  08/28/2011    Procedure: INSERTION OF DIALYSIS CATHETER;  Surgeon: Fransisco Hertz, MD;  Location[facility] OR;  Service: Vascular;  Laterality: Left;  Atempted Bilateral Internal Jugular, Bilateral Subclavin insertion of 55cm Dialysis Catheter Left Femoral  . Insertion of dialysis catheter  12/15/2011    Procedure: INSERTION OF DIALYSIS CATHETER;  Surgeon: Sherren Kerns, MD;  Location: [facility] OR;  Service: Vascular;  Laterality: Right;  Insertion of Right Femoral Dialysis Catheter  . Exchange of a dialysis catheter  02/26/2012    Procedure: EXCHANGE OF A DIALYSIS CATHETER;  Surgeon: Larina Earthly, MD;  Location: [facility] OR;  Service: Vascular;  Laterality: Right;  . Joint replacement    . Exchange of a dialysis catheter  05/18/2012    Procedure: EXCHANGE OF A DIALYSIS CATHETER;  Surgeon: Larina Earthly, MD;  Location: [facility] OR;  Service: Vascular;  Laterality: Right;  right femoral dialysis catheter  . Av fistula placement  05/18/2012    Procedure: INSERTION OF ARTERIOVENOUS (AV) GORE-TEX GRAFT THIGH;  Surgeon: Larina Earthly, MD;  Location: [facility] OR;  Service: Vascular;  Laterality: Left;  using 6mm by 70cm goretex graft  . Thrombectomy w/ embolectomy Left 08/25/2012    Procedure: THROMBECTOMY ARTERIOVENOUS GORE-TEX GRAFT;  Surgeon: Pryor Ochoa, MD;  Location: [facility] OR;  Service: Vascular;  Laterality: Left;  Attempted thrombectomy of left thigh arteriovenous gortex graft.   . Av fistula placement Left 08/25/2012    Procedure: INSERTION OF ARTERIOVENOUS (AV) GORE-TEX GRAFT THIGH;  Surgeon: Pryor Ochoa, MD;  Location: [facility] OR;  Service: Vascular;  Laterality: Left;  Using 6mm x 40cm vascular Gortex graft.  . Revision of arteriovenous goretex graft Left  12/14/2012    Procedure: Revision of Left Thigh Graft;  Surgeon: Larina Earthly, MD;  Location: [facility] OR;  Service: Vascular;  Laterality: Left;  . Avgg removal Left 02/16/2013    Procedure: EXCISION OF LEFT ARM ARTERIOVENOUS GORETEX GRAFT TIMES 2 WITH VEIN PATCH ANGIOPLASTY OF BRACIAL ARTERY.  ;  Surgeon: Chuck Hint, MD;  Location: [facility] OR;  Service: Vascular;  Laterality: Left;  Converted from MAC to General.    . Revision of arteriovenous goretex graft Left 08/08/2013    Procedure: REVISION OF LEFT THIGH ARTERIOVENOUS GORETEX GRAFT;  Surgeon: Sherren Kerns, MD;  Location: [facility] OR;  Service: Vascular;  Laterality: Left;  . Venogram Bilateral 10/27/2011    Procedure: VENOGRAM;  Surgeon: Nada Libman, MD;  Location: [facility] CATH LAB;  Service: Cardiovascular;  Laterality: Bilateral;  bilat upper extrem venograms  . Left heart cath Bilateral 05/21/2013    Procedure: LEFT HEART CATH;  Surgeon: Iran Ouch, MD;  Location: [facility] CATH LAB;  Service: Cardiovascular;  Laterality: Bilateral;  . Percutaneous coronary stent  intervention (pci-s)  05/21/2013    Procedure: PERCUTANEOUS CORONARY STENT INTERVENTION (PCI-S);  Surgeon: Iran Ouch, MD;  Location: [facility] CATH LAB;  Service: Cardiovascular;;  . Coronary angioplasty with stent placement    . Radiology with anesthesia N/A 06/27/2014    Procedure: MRI LUMBER WITHOUT CONTRAST;  Surgeon: Medication Radiologist, MD;  Location: [facility] OR;  Service: Radiology;  Laterality: N/A;  . Revision of arteriovenous goretex graft Left 08/13/2014    Procedure: REVISION OF ARTERIOVENOUS GORETEX GRAFT LEFT THIGH;  Surgeon: Sherren Kerns, MD;  Location: [facility] OR;  Service: Vascular;  Laterality: Left;  . Radiology with anesthesia N/A 08/16/2014    Procedure: MRI;  Surgeon: Medication Radiologist, MD;  Location: [facility] OR;  Service: Radiology;  Laterality: N/A;  . Thrombectomy w/ embolectomy Left 08/22/2014    Procedure: THROMBECTOMY ARTERIOVENOUS GORE-TEX GRAFT Of Left Thigh Graft;   Surgeon: Larina Earthly, MD;  Location: [facility] OR;  Service: Vascular;  Laterality: Left;  . Peripheral vascular catheterization N/A 10/09/2014    Procedure: Shuntogram;  Surgeon: Nada Libman, MD;  Location: [facility] INVASIVE CV LAB;  Service: Cardiovascular;  Laterality: N/A;  . Av fistula placement Left 10/30/2014    Procedure: INSERTION OF LEFT THIGH GRAFT;  Surgeon: Nada Libman, MD;  Location: [facility] OR;  Service: Vascular;  Laterality: Left;  . Removal of graft Left 10/30/2014    Procedure: REMOVAL OF LEFT THIGH GRAFT;  Surgeon: Nada Libman, MD;  Location: [facility] OR;  Service: Vascular;  Laterality: Left;  . Peripheral vascular catheterization Left 01/01/2015    Procedure:  Shuntogram;  Surgeon: Nada Libman, MD;  Location: [facility] INVASIVE CV LAB;  Service: Cardiovascular;  Laterality: Left;   Social History:  reports that he has been smoking Cigarettes.  He has a 8 pack-year smoking history. He has never used smokeless tobacco. He reports that he does not drink alcohol or use illicit drugs. nursing home resident No Known Allergies  Family History  Problem Relation Age of Onset  . Diabetes Mother   . Stroke Mother   . Heart disease Mother   . Kidney disease Father   . Hyperlipidemia Father      Prior to Admission medications   Medication Sig Start Date End Date Taking? Authorizing Provider  Amino Acids-Protein Hydrolys (FEEDING SUPPLEMENT, PRO-STAT SUGAR FREE 64,) LIQD Take 30 mLs by mouth 2 (two) times daily.    Historical Provider, MD  aspirin 81 MG tablet Take 1 tablet (81 mg total) by mouth daily. 05/24/13   Rhonda G Barrett, PA-C  atorvastatin (LIPITOR) 80 MG tablet Take 1 tablet (80 mg total) by mouth daily at 6 PM. 05/24/13   Rhonda G Barrett, PA-C  B Complex-C (B-COMPLEX WITH VITAMIN C) tablet Take 1 tablet by mouth at bedtime.    Historical Provider, MD  calcium carbonate (TUMS - DOSED IN MG ELEMENTAL CALCIUM) 500 MG chewable tablet Chew 3 tablets by mouth 3 (three) times daily.     Historical Provider, MD  clopidogrel (PLAVIX) 75 MG tablet Take 1 tablet (75 mg total) by mouth daily. 10/10/14   Ashly Hulen Skains, DO  colchicine 0.6 MG tablet Take 0.6 mg by mouth every 12 (twelve) hours as needed (gout).     Historical Provider, MD  dorzolamide-timolol (COSOPT) 22.3-6.8 MG/ML ophthalmic solution Place 1 drop into both eyes 3 (three) times daily.    Historical Provider, MD  escitalopram (LEXAPRO) 10 MG tablet Take 10 mg by mouth daily.    Historical Provider, MD  fentaNYL (DURAGESIC - DOSED MCG/HR) 50 MCG/HR Apply one patch topically every 72 hours for pain. Remove old patch 10/08/14   Tiffany L Reed, DO  gabapentin (NEURONTIN) 100 MG capsule Take 100 mg by mouth every 12 (twelve) hours as needed (neuropathy).  12/29/13   Historical Provider, MD  latanoprost (XALATAN) 0.005 % ophthalmic solution Place 1 drop into both eyes at bedtime.  04/20/14   Historical Provider, MD  Menthol, Topical Analgesic, 4 % GEL Apply 1 application topically every 6 (six) hours as needed (pain). Apply to shoulders/lower back    Historical Provider, MD  multivitamin (RENA-VIT) TABS tablet Take 1 tablet by mouth at bedtime. 02/21/15   Catarina Hartshorn, MD  nitroGLYCERIN (NITROSTAT) 0.4 MG SL tablet Place 1 tablet (0.4 mg total) under the tongue every 5 (five) minutes as needed for chest pain. 05/24/13   Rhonda G Barrett, PA-C  sulindac (CLINORIL) 200 MG tablet Take 200 mg by mouth 2 (two) times daily.    Historical Provider, MD   Physical Exam: Filed Vitals:   03/27/15 1430 03/27/15 1445 03/27/15 1500 03/27/15 1515  BP: 113/72 118/74 115/77 121/87  Pulse: 88 79 91 91  Temp:      Resp: 18 22 21 18   Height:      Weight:      SpO2: 95% 95% 96% 91%    Wt Readings from Last 3 Encounters:  03/27/15 102.967 kg (227 lb)  02/22/15 103.3 kg (227 lb 11.8 oz)  01/01/15 112.492 kg (248 lb)    General:  Appears quite lethargic and chronically ill Eyes: PERRL, normal lids, irises & conjunctiva +scleral  icterus ENT: grossly normal hearing, he does membranes of his mouth are pink but quite dry Neck: no LAD, masses or thyromegaly Cardiovascular: RRR, no m/r/g.  Trace LE edema with right slightly greater than left Telemetry: SR, no arrhythmias  Respiratory: CTA bilaterally, faint crackles no wheeze. Normal respiratory effort somewhat shallow Abdomen: soft, ntnd positive bowel sounds no guarding Skin: no rash or induration seen on limited exam. Left thigh with AV graft Musculoskeletal: grossly normal tone BUE/BLE Psychiatric: grossly normal mood and affect, speech fluent and appropriate Neurologic: grossly non-focal. Speech is slow and very deliberate attempts to follow commands           Labs on Admission:  Basic Metabolic Panel:  Recent Labs Lab 03/27/15 1356  NA 137  K 4.5  CL 95*  CO2 32  GLUCOSE 104*  BUN 29*  CREATININE 4.86*  CALCIUM 8.7*   Liver Function Tests:  Recent Labs Lab 03/27/15 1356  AST 93*  ALT 26  ALKPHOS 260*  BILITOT 7.8*  PROT 7.4  ALBUMIN 2.3*   No results for input(s): LIPASE, AMYLASE in the last 168 hours. No results for input(s): AMMONIA in the last 168 hours. CBC:  Recent Labs Lab 03/27/15 1356  WBC 15.0*  NEUTROABS 13.7*  HGB 10.3*  HCT 31.5*  MCV 84.9  PLT 212   Cardiac Enzymes: No results for input(s): CKTOTAL, CKMB, CKMBINDEX, TROPONINI in the last 168 hours.  BNP (last 3 results) No results for input(s): BNP in the last 8760 hours.  ProBNP (last 3 results) No results for input(s): PROBNP in the last 8760 hours.   CREATININE: 4.86 mg/dL ABNORMAL (82/64/15 8309) Estimated creatinine clearance - 19.4 mL/min  CBG: No results for input(s): GLUCAP in the last 168 hours.  Radiological Exams on Admission: Ct Head Wo Contrast  03/27/2015  CLINICAL DATA:  Altered mental status. EXAM: CT HEAD  WITHOUT CONTRAST TECHNIQUE: Contiguous axial images were obtained from the base of the skull through the vertex without intravenous  contrast. COMPARISON:  CT scan of February 18, 2015. FINDINGS: Stable opacification of left maxillary sinus consistent with sinusitis or mucocele. Bony calvarium is intact. Mild diffuse cortical atrophy is noted. Mild chronic ischemic white matter disease is noted. No mass effect or midline shift is noted. Ventricular size is within normal limits. There is no evidence of mass lesion, hemorrhage or acute infarction. IMPRESSION: Stable opacification of left maxillary sinus consistent with sinusitis or mucocele. Mild diffuse cortical atrophy. Mild chronic ischemic white matter disease. No acute intracranial abnormality seen. Electronically Signed   By: Lupita Raider, M.D.   On: 03/27/2015 16:02   Dg Chest Portable 1 View  03/27/2015  CLINICAL DATA:  Hypotension with lethargy.  Chronic renal failure EXAM: PORTABLE CHEST 1 VIEW COMPARISON:  February 19, 2015 FINDINGS: There is patchy infiltrate in the right base. The lungs elsewhere are clear. Heart is mildly enlarged with pulmonary vascularity within normal limits. No adenopathy. 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 IMPRESSION: Right base infiltrate. Lungs elsewhere clear. Stable cardiac prominence. 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 Electronically Signed   By: Bretta Bang III M.D.   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 Acute  respiratory failure with hypoxia. Oxygen saturation level 85% in the field. I clearly related to healthcare associated pneumonia in the setting of missed dialysis -Oxygen saturation level much improved in the emergency department -Admit to step down -Continue oxygen supplementation and wean as able -nebs as needed -Antibiotics -Dialysis  #2. Healthcare associated pneumonia. Chest x-ray. Recent admission for same. Lives at a rehabilitation center as well as 3 times a week dialysis -Antibiotics per protocol -Trend lactic acid -Strep pneumo and Legionella urine antigens -Sputum culture as able -Speech therapy for swallow eval -Blood cultures as indicated  #3. Elevated troponin. Troponins chronically elevated but current level slightly above baseline. Denies chest pain. EKG without acute changes -Admit to telemetry -Cycle troponin -Repeat EKG in the a.m. -History of CAD -History of inferior STEMI with LAD stent January 2015 -Continue Plavix and aspirin -  #4. Acute encephalopathy. Likely related to above. CT of the head without acute changes -Antibiotics as noted above -Dialysis consult requested -Oxygen supplementation  #5. Endstage renal disease on dialysis. Only 20 minutes of dialysis today. -He is a Monday Wednesday Friday dialysis patient and aspirin -Spoke with Dr. Lowell Guitar regarding scheduling dialysis today  #6. Abnormal LFTs. History of same. -Alkaline phosphatase and total bilirubin elevated from 1 month ago -Check ammonia level -History of chronic hepatitis C -MELD score 31 on INR 1.2 -OP follow up -consider palliti  #7. Chronic diastolic heart failure -Recent echo with an EF of 55%. -Monitor intake and output -Daily weight -Home medications include  #8. Chronic pain. History of back pain -Home medications include gabapentin and fentanyl  patch. 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 Diabetes type 2. Controlled -Diet controlled -Recent A1c 5.6 -Monitor CBG G's and use sliding scale insulin as indicated  #10. Anemia, normocytic. 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  -stable -ENT consult obtained at last hospitalization. They opined likely related to chronic sinusitis recommend outpatient workup  Dr Lowell Guitar   Code Status: full. Prognosis poor. Consider discussion regarding palliative care/code status DVT Prophylaxis: Family Communication:  None present Disposition Plan: back to facility   time spent: 75 minutes  [facility] M [facility]

## 2015-03-27 NOTE — Consult Note (Signed)
Renal Service Consult Note [facility]  [name] 03/27/2015 [name] Requesting Physician:  Dr Konrad Dolores  Reason for Consult:  ESRD pt w AMS HPI: The patient is a 59 y.o. year-old with hx of DJD, diast HF, smoker, HTN, CAD w stents presented to HD today with AMS.  Ran only 20 min then sent to ED.  In ED BP 98/56, SaO2 85%, improved to 99% w NRB mask.  CXR RLL density, crowded vessels, no overt CHF.  Patient no current complaints.  Denies any pain, garbled speech.  ASked to see for ESRD./ dialysis.   Home meds are asa, statin, plavix, colchicine, lexapro, fentanyl patch, neurotin 100 mg prn, eye drops, clinoril   Past Medical History  Past Medical History  Diagnosis Date  . Anxiety   . Back pain   . GERD (gastroesophageal reflux disease)   . Peripheral neuropathy (HCC)   . Arthritis   . Chronic diastolic CHF (congestive heart failure) (HCC)     a. 04/2013 Echo: EF nl, no rwma, mild LVH.  Marland Kitchen Active smoker   . ESRD on hemodialysis (HCC)     [name] 4 days per week on M-Tu-Wed and Fri.  Started HD in 1998 and has been on HD initially at [facility], then went to [facility], then in [facility], then to [facility] and now is at [facility] for last 10 years.  Has L thigh AVG.     . Diabetes mellitus     diet controlled  . 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 PONV (postoperative nausea and vomiting)   . Hypertension   . Bell palsy   . Carpal tunnel syndrome     bilateral  . Palpitations   . Headache(784.0)     Hx: of Migraines  . CAD (coronary artery disease)     a. 04/2013 Ant STEMI/PCI: LM nl, LAD 95p (3.0x18 Xience DES), D1/2/3 small, LCX 80ost, RI 20p, RCA 163m CTO (unsuccessful PCI), EF 55%, mod basal inf HK.  . S/P coronary artery stent placement 05/21/2013    Xience Alpine Medtronic conditional 5 @ 1.5 and 3T   . Mucocele of maxillary sinus    Past Surgical History  Past Surgical History  Procedure Laterality Date  . Total hip  arthroplasty    . Thyroidectomy    . Tooth extraction    . Mandible fracture surgery    . Dg av dialysis graft declot or    . Insertion of dialysis catheter  03/28/2011    Procedure: INSERTION OF DIALYSIS CATHETER;  Surgeon: Pryor Ochoa, MD;  Location: [facility] OR;  Service: Vascular;  Laterality: Left;  . Av fistula placement  03/31/2011    Procedure: INSERTION OF ARTERIOVENOUS (AV) GORE-TEX GRAFT THIGH;  Surgeon: Sherren Kerns, MD;  Location: [facility] OR;  Service: Vascular;  Laterality: Right;  redo right thigh arteriovenous gortex graft using gore-tex stretch 6mm x 70cm  . Thrombectomy w/ embolectomy  06/02/2011    Procedure: THROMBECTOMY ARTERIOVENOUS GORE-TEX GRAFT;  Surgeon: Chuck Hint, MD;  Location: [facility] OR;  Service: Vascular;  Laterality: Right;  Thrombectomy right thigh arteriovenous gortex graft;  revision by  replacement of large portion of graft with 7mm gore-tex   . Insertion of dialysis catheter  08/28/2011    Procedure: INSERTION OF DIALYSIS CATHETER;  Surgeon: Fransisco Hertz, MD;  Location: [facility] OR;  Service: Vascular;  Laterality: Left;  Atempted Bilateral Internal  Jugular, Bilateral Subclavin insertion of 55cm Dialysis Catheter Left Femoral  . Insertion of dialysis catheter  12/15/2011    Procedure: INSERTION OF DIALYSIS CATHETER;  Surgeon: Sherren Kerns, MD;  Location: [facility] OR;  Service: Vascular;  Laterality: Right;  Insertion of Right Femoral Dialysis Catheter  . Exchange of a dialysis catheter  02/26/2012    Procedure: EXCHANGE OF A DIALYSIS CATHETER;  Surgeon: Larina Earthly, MD;  Location: [facility] OR;  Service: Vascular;  Laterality: Right;  . Joint replacement    . Exchange of a dialysis catheter  05/18/2012    Procedure: EXCHANGE OF A DIALYSIS CATHETER;  Surgeon: Larina Earthly, MD;  Location: [facility] OR;  Service: Vascular;  Laterality: Right;  right femoral dialysis catheter  . Av fistula placement  05/18/2012    Procedure: INSERTION OF ARTERIOVENOUS (AV) GORE-TEX GRAFT THIGH;  Surgeon:  Larina Earthly, MD;  Location: [facility] OR;  Service: Vascular;  Laterality: Left;  using 32mm by 70cm goretex graft  . Thrombectomy w/ embolectomy Left 08/25/2012    Procedure: THROMBECTOMY ARTERIOVENOUS GORE-TEX GRAFT;  Surgeon: Pryor Ochoa, MD;  Location: [facility] OR;  Service: Vascular;  Laterality: Left;  Attempted thrombectomy of left thigh arteriovenous gortex graft.   . Av fistula placement Left 08/25/2012    Procedure: INSERTION OF ARTERIOVENOUS (AV) GORE-TEX GRAFT THIGH;  Surgeon: Pryor Ochoa, MD;  Location: [facility] OR;  Service: Vascular;  Laterality: Left;  Using 40mm x 40cm vascular Gortex graft.  . Revision of arteriovenous goretex graft Left 12/14/2012    Procedure: Revision of Left Thigh Graft;  Surgeon: Larina Earthly, MD;  Location: [facility] OR;  Service: Vascular;  Laterality: Left;  . Avgg removal Left 02/16/2013    Procedure: EXCISION OF LEFT ARM ARTERIOVENOUS GORETEX GRAFT TIMES 2 WITH VEIN PATCH ANGIOPLASTY OF BRACIAL ARTERY.  ;  Surgeon: Chuck Hint, MD;  Location: [facility] OR;  Service: Vascular;  Laterality: Left;  Converted from MAC to General.    . Revision of arteriovenous goretex graft Left 08/08/2013    Procedure: REVISION OF LEFT THIGH ARTERIOVENOUS GORETEX GRAFT;  Surgeon: Sherren Kerns, MD;  Location: [facility] OR;  Service: Vascular;  Laterality: Left;  . Venogram Bilateral 10/27/2011    Procedure: VENOGRAM;  Surgeon: Nada Libman, MD;  Location: [facility] CATH LAB;  Service: Cardiovascular;  Laterality: Bilateral;  bilat upper extrem venograms  . Left heart cath Bilateral 05/21/2013    Procedure: LEFT HEART CATH;  Surgeon: Iran Ouch, MD;  Location: [facility] CATH LAB;  Service: Cardiovascular;  Laterality: Bilateral;  . Percutaneous coronary stent intervention (pci-s)  05/21/2013    Procedure: PERCUTANEOUS CORONARY STENT INTERVENTION (PCI-S);  Surgeon: Iran Ouch, MD;  Location: [facility] CATH LAB;  Service: Cardiovascular;;  . Coronary angioplasty with stent placement    . Radiology with anesthesia  N/A 06/27/2014    Procedure: MRI LUMBER WITHOUT CONTRAST;  Surgeon: Medication Radiologist, MD;  Location: [facility] OR;  Service: Radiology;  Laterality: N/A;  . Revision of arteriovenous goretex graft Left 08/13/2014    Procedure: REVISION OF ARTERIOVENOUS GORETEX GRAFT LEFT THIGH;  Surgeon: Sherren Kerns, MD;  Location: [facility] OR;  Service: Vascular;  Laterality: Left;  . Radiology with anesthesia N/A 08/16/2014    Procedure: MRI;  Surgeon: Medication Radiologist, MD;  Location: [facility] OR;  Service: Radiology;  Laterality: N/A;  . Thrombectomy w/ embolectomy Left 08/22/2014    Procedure: THROMBECTOMY ARTERIOVENOUS GORE-TEX GRAFT Of Left Thigh Graft;  Surgeon: Larina Earthly, MD;  Location:  [facility] OR;  Service: Vascular;  Laterality: Left;  . Peripheral vascular catheterization N/A 10/09/2014    Procedure: Shuntogram;  Surgeon: Nada Libman, MD;  Location: [facility] INVASIVE CV LAB;  Service: Cardiovascular;  Laterality: N/A;  . Av fistula placement Left 10/30/2014    Procedure: INSERTION OF LEFT THIGH GRAFT;  Surgeon: Nada Libman, MD;  Location: [facility] OR;  Service: Vascular;  Laterality: Left;  . Removal of graft Left 10/30/2014    Procedure: REMOVAL OF LEFT THIGH GRAFT;  Surgeon: Nada Libman, MD;  Location: [facility] OR;  Service: Vascular;  Laterality: Left;  . Peripheral vascular catheterization Left 01/01/2015    Procedure:  Shuntogram;  Surgeon: Nada Libman, MD;  Location: [facility] INVASIVE CV LAB;  Service: Cardiovascular;  Laterality: Left;   Family History  Family History  Problem Relation Age of Onset  . Diabetes Mother   . Stroke Mother   . Heart disease Mother   . Kidney disease Father   . Hyperlipidemia Father    Social History  reports that he has been smoking Cigarettes.  He has a 8 pack-year smoking history. He has never used smokeless tobacco. He reports that he does not drink alcohol or use illicit drugs. Allergies No Known Allergies Home medications Prior to Admission medications   Medication Sig Start  Date End Date Taking? Authorizing Provider  Amino Acids-Protein Hydrolys (FEEDING SUPPLEMENT, PRO-STAT SUGAR FREE 64,) LIQD Take 30 mLs by mouth 2 (two) times daily.    Historical Provider, MD  aspirin 81 MG tablet Take 1 tablet (81 mg total) by mouth daily. 05/24/13   Rhonda G Barrett, PA-C  atorvastatin (LIPITOR) 80 MG tablet Take 1 tablet (80 mg total) by mouth daily at 6 PM. 05/24/13   Rhonda G Barrett, PA-C  B Complex-C (B-COMPLEX WITH VITAMIN C) tablet Take 1 tablet by mouth at bedtime.    Historical Provider, MD  calcium carbonate (TUMS - DOSED IN MG ELEMENTAL CALCIUM) 500 MG chewable tablet Chew 3 tablets by mouth 3 (three) times daily.    Historical Provider, MD  clopidogrel (PLAVIX) 75 MG tablet Take 1 tablet (75 mg total) by mouth daily. 10/10/14   Ashly Hulen Skains, DO  colchicine 0.6 MG tablet Take 0.6 mg by mouth every 12 (twelve) hours as needed (gout).     Historical Provider, MD  dorzolamide-timolol (COSOPT) 22.3-6.8 MG/ML ophthalmic solution Place 1 drop into both eyes 3 (three) times daily.    Historical Provider, MD  escitalopram (LEXAPRO) 10 MG tablet Take 10 mg by mouth daily.    Historical Provider, MD  fentaNYL (DURAGESIC - DOSED MCG/HR) 50 MCG/HR Apply one patch topically every 72 hours for pain. Remove old patch 10/08/14   Tiffany L Reed, DO  gabapentin (NEURONTIN) 100 MG capsule Take 100 mg by mouth every 12 (twelve) hours as needed (neuropathy).  12/29/13   Historical Provider, MD  latanoprost (XALATAN) 0.005 % ophthalmic solution Place 1 drop into both eyes at bedtime.  04/20/14   Historical Provider, MD  Menthol, Topical Analgesic, 4 % GEL Apply 1 application topically every 6 (six) hours as needed (pain). Apply to shoulders/lower back    Historical Provider, MD  multivitamin (RENA-VIT) TABS tablet Take 1 tablet by mouth at bedtime. 02/21/15   Catarina Hartshorn, MD  nitroGLYCERIN (NITROSTAT) 0.4 MG SL tablet Place 1 tablet (0.4 mg total) under the tongue every 5 (five) minutes as  needed for chest pain. 05/24/13   Rhonda G Barrett, PA-C  sulindac (CLINORIL) 200 MG  tablet Take 200 mg by mouth 2 (two) times daily.    Historical Provider, MD   Liver Function Tests  Recent Labs Lab 03/27/15 1356  AST 93*  ALT 26  ALKPHOS 260*  BILITOT 7.8*  PROT 7.4  ALBUMIN 2.3*   No results for input(s): LIPASE, AMYLASE in the last 168 hours. CBC  Recent Labs Lab 03/27/15 1356  WBC 15.0*  NEUTROABS 13.7*  HGB 10.3*  HCT 31.5*  MCV 84.9  PLT 212   Basic Metabolic Panel  Recent Labs Lab 03/27/15 1356  NA 137  K 4.5  CL 95*  CO2 32  GLUCOSE 104*  BUN 29*  CREATININE 4.86*  CALCIUM 8.7*    Filed Vitals:   03/27/15 1500 03/27/15 1515 03/27/15 1654 03/27/15 1700  BP: 115/77 121/87 113/70 99/59  Pulse: 91 91 93 92  Temp:      Resp: 21 18 25 21   Height:      Weight:      SpO2: 96% 91% 95% 93%   Exam Obese lethargic, mumbled speech, no acute distress No rash, cyanosis or gangrene Sclera anicteric, throat clear Throat dry Chest clear bilat, poor inspiration, dec'd at bases RRR no RG Abd obese ntnd +bs soft GU normal Bilat LE pedal edema 1-2+  L fem AVG +bruit Neuro nonfocal, opens eyes , recognized examiner, gen weakness  MWF [facility]  4.5h  98kg  2/2.25 bath   Heparin none  L fem AVG Hect 2 ug Mircera 50 q2wk, due today Venofer 100/ wk  BP's at OP HD normal 120-140 systolic   Assessment: 1 AMS possible PNA on abx 2 Hypotension 3 Hypoxemia 4 Morbid obesity 5 ESRD leaving below dry wt 6 Vol possible vol excess 7 CAD hx stents 8 Chron pain on duragesic patch   Plan - attempt HD tonight w UF as BP tolerates  Vinson Moselle MD [facility] pager [phone]    cell [phone] 03/27/2015, 5:12 PM

## 2015-03-27 NOTE — ED Provider Notes (Signed)
CSN: [id number]     Arrival date & time 03/27/15  1341 History   First MD Initiated Contact with Patient 03/27/15 1343     Chief Complaint  Patient presents with  . Altered Mental Status     (Consider location/radiation/quality/duration/timing/severity/associated sxs/prior Treatment) Patient is a 59 y.o. male presenting with altered mental status. The history is provided by the patient and the EMS personnel.  Altered Mental Status  level V caveat due to altered mental status Patient presents with reported altered mental status and low blood pressure. Sent from dialysis. Reportedly had 20 minutes of dialysis. Left thigh AV graft still access. Patient is awake but somewhat slow in answering is not right much history. Patient lives in a nursing home but did not come with any of the paperwork from the nursing home since he came from dialysis. Patient just states he feels bad all over. Denies chest pain. Denies fever.  Past Medical History  Diagnosis Date  . Anxiety   . Back pain   . GERD (gastroesophageal reflux disease)   . Peripheral neuropathy (HCC)   . Arthritis   . Chronic diastolic CHF (congestive heart failure) (HCC)     a. 04/2013 Echo: EF nl, no rwma, mild LVH.  Marland Kitchen Active smoker   . ESRD on hemodialysis (HCC)     [name] 4 days per week on M-Tu-Wed and Fri.  Started HD in 1998 and has been on HD initially at [facility], then went to [facility], then in [facility], then to [facility] and now is at [facility] for last 10 years.  Has L thigh AVG.     . Diabetes mellitus     diet controlled  . 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 PONV (postoperative nausea and vomiting)   . Hypertension   . Bell palsy   . Carpal tunnel syndrome     bilateral  . Palpitations   . Headache(784.0)     Hx: of Migraines  . CAD (coronary artery disease)     a. 04/2013 Ant STEMI/PCI: LM nl, LAD 95p (3.0x18 Xience DES), D1/2/3 small, LCX 80ost, RI 20p, RCA 166m CTO (unsuccessful PCI),  EF 55%, mod basal inf HK.  . S/P coronary artery stent placement 05/21/2013    Xience Alpine Medtronic conditional 5 @ 1.5 and 3T   . Mucocele of maxillary sinus    Past Surgical History  Procedure Laterality Date  . Total hip arthroplasty    . Thyroidectomy    . Tooth extraction    . Mandible fracture surgery    . Dg av dialysis graft declot or    . Insertion of dialysis catheter  03/28/2011    Procedure: INSERTION OF DIALYSIS CATHETER;  Surgeon: Pryor Ochoa, MD;  Location: [facility] OR;  Service: Vascular;  Laterality: Left;  . Av fistula placement  03/31/2011    Procedure: INSERTION OF ARTERIOVENOUS (AV) GORE-TEX GRAFT THIGH;  Surgeon: Sherren Kerns, MD;  Location: [facility] OR;  Service: Vascular;  Laterality: Right;  redo right thigh arteriovenous gortex graft using gore-tex stretch 6mm x 70cm  . Thrombectomy w/ embolectomy  06/02/2011    Procedure: THROMBECTOMY ARTERIOVENOUS GORE-TEX GRAFT;  Surgeon: Chuck Hint, MD;  Location: [facility] OR;  Service: Vascular;  Laterality: Right;  Thrombectomy right thigh arteriovenous gortex graft;  revision by  replacement of large portion of graft with 7mm gore-tex   . Insertion of dialysis catheter  08/28/2011  Procedure: INSERTION OF DIALYSIS CATHETER;  Surgeon: Fransisco Hertz, MD;  Location: [facility];  Service: Vascular;  Laterality: Left;  Atempted Bilateral Internal Jugular, Bilateral Subclavin insertion of 55cm Dialysis Catheter Left Femoral  . Insertion of dialysis catheter  12/15/2011    Procedure: INSERTION OF DIALYSIS CATHETER;  Surgeon: Sherren Kerns, MD;  Location: [facility] OR;  Service: Vascular;  Laterality: Right;  Insertion of Right Femoral Dialysis Catheter  . Exchange of a dialysis catheter  02/26/2012    Procedure: EXCHANGE OF A DIALYSIS CATHETER;  Surgeon: Larina Earthly, MD;  Location: [facility] OR;  Service: Vascular;  Laterality: Right;  . Joint replacement    . Exchange of a dialysis catheter  05/18/2012    Procedure: EXCHANGE OF A DIALYSIS CATHETER;   Surgeon: Larina Earthly, MD;  Location: [facility] OR;  Service: Vascular;  Laterality: Right;  right femoral dialysis catheter  . Av fistula placement  05/18/2012    Procedure: INSERTION OF ARTERIOVENOUS (AV) GORE-TEX GRAFT THIGH;  Surgeon: Larina Earthly, MD;  Location: [facility] OR;  Service: Vascular;  Laterality: Left;  using 6mm by 70cm goretex graft  . Thrombectomy w/ embolectomy Left 08/25/2012    Procedure: THROMBECTOMY ARTERIOVENOUS GORE-TEX GRAFT;  Surgeon: Pryor Ochoa, MD;  Location: [facility] OR;  Service: Vascular;  Laterality: Left;  Attempted thrombectomy of left thigh arteriovenous gortex graft.   . Av fistula placement Left 08/25/2012    Procedure: INSERTION OF ARTERIOVENOUS (AV) GORE-TEX GRAFT THIGH;  Surgeon: Pryor Ochoa, MD;  Location: [facility] OR;  Service: Vascular;  Laterality: Left;  Using 6mm x 40cm vascular Gortex graft.  . Revision of arteriovenous goretex graft Left 12/14/2012    Procedure: Revision of Left Thigh Graft;  Surgeon: Larina Earthly, MD;  Location: [facility] OR;  Service: Vascular;  Laterality: Left;  . Avgg removal Left 02/16/2013    Procedure: EXCISION OF LEFT ARM ARTERIOVENOUS GORETEX GRAFT TIMES 2 WITH VEIN PATCH ANGIOPLASTY OF BRACIAL ARTERY.  ;  Surgeon: Chuck Hint, MD;  Location: [facility] OR;  Service: Vascular;  Laterality: Left;  Converted from MAC to General.    . Revision of arteriovenous goretex graft Left 08/08/2013    Procedure: REVISION OF LEFT THIGH ARTERIOVENOUS GORETEX GRAFT;  Surgeon: Sherren Kerns, MD;  Location: [facility] OR;  Service: Vascular;  Laterality: Left;  . Venogram Bilateral 10/27/2011    Procedure: VENOGRAM;  Surgeon: Nada Libman, MD;  Location: [facility] CATH LAB;  Service: Cardiovascular;  Laterality: Bilateral;  bilat upper extrem venograms  . Left heart cath Bilateral 05/21/2013    Procedure: LEFT HEART CATH;  Surgeon: Iran Ouch, MD;  Location: [facility] CATH LAB;  Service: Cardiovascular;  Laterality: Bilateral;  . Percutaneous coronary stent intervention (pci-s)   05/21/2013    Procedure: PERCUTANEOUS CORONARY STENT INTERVENTION (PCI-S);  Surgeon: Iran Ouch, MD;  Location: [facility] CATH LAB;  Service: Cardiovascular;;  . Coronary angioplasty with stent placement    . Radiology with anesthesia N/A 06/27/2014    Procedure: MRI LUMBER WITHOUT CONTRAST;  Surgeon: Medication Radiologist, MD;  Location: [facility] OR;  Service: Radiology;  Laterality: N/A;  . Revision of arteriovenous goretex graft Left 08/13/2014    Procedure: REVISION OF ARTERIOVENOUS GORETEX GRAFT LEFT THIGH;  Surgeon: Sherren Kerns, MD;  Location: [facility] OR;  Service: Vascular;  Laterality: Left;  . Radiology with anesthesia N/A 08/16/2014    Procedure: MRI;  Surgeon: Medication Radiologist, MD;  Location: [facility] OR;  Service: Radiology;  Laterality: N/A;  .  Thrombectomy w/ embolectomy Left 08/22/2014    Procedure: THROMBECTOMY ARTERIOVENOUS GORE-TEX GRAFT Of Left Thigh Graft;  Surgeon: Larina Earthly, MD;  Location: [facility] OR;  Service: Vascular;  Laterality: Left;  . Peripheral vascular catheterization N/A 10/09/2014    Procedure: Shuntogram;  Surgeon: Nada Libman, MD;  Location: [facility] INVASIVE CV LAB;  Service: Cardiovascular;  Laterality: N/A;  . Av fistula placement Left 10/30/2014    Procedure: INSERTION OF LEFT THIGH GRAFT;  Surgeon: Nada Libman, MD;  Location: [name] OR;  Service: Vascular;  Laterality: Left;  . Removal of graft Left 10/30/2014    Procedure: REMOVAL OF LEFT THIGH GRAFT;  Surgeon: Nada Libman, MD;  Location: [facility] OR;  Service: Vascular;  Laterality: Left;  . Peripheral vascular catheterization Left 01/01/2015    Procedure:  Shuntogram;  Surgeon: Nada Libman, MD;  Location: [facility] INVASIVE CV LAB;  Service: Cardiovascular;  Laterality: Left;   Family History  Problem Relation Age of Onset  . Diabetes Mother   . Stroke Mother   . Heart disease Mother   . Kidney disease Father   . Hyperlipidemia Father    Social History  Substance Use Topics  . Smoking status: Current Every Day Smoker --  0.20 packs/day for 40 years    Types: Cigarettes  . Smokeless tobacco: Never Used  . Alcohol Use: No    Review of Systems  Unable to perform ROS: Mental status change  Respiratory: Negative for shortness of breath.       Allergies  Review of patient's allergies indicates no known allergies.  Home Medications   Prior to Admission medications   Medication Sig Start Date End Date Taking? Authorizing Provider  Amino Acids-Protein Hydrolys (FEEDING SUPPLEMENT, PRO-STAT SUGAR FREE 64,) LIQD Take 30 mLs by mouth 2 (two) times daily.    Historical Provider, MD  aspirin 81 MG tablet Take 1 tablet (81 mg total) by mouth daily. 05/24/13   Rhonda G Barrett, PA-C  atorvastatin (LIPITOR) 80 MG tablet Take 1 tablet (80 mg total) by mouth daily at 6 PM. 05/24/13   Rhonda G Barrett, PA-C  B Complex-C (B-COMPLEX WITH VITAMIN C) tablet Take 1 tablet by mouth at bedtime.    Historical Provider, MD  calcium carbonate (TUMS - DOSED IN MG ELEMENTAL CALCIUM) 500 MG chewable tablet Chew 3 tablets by mouth 3 (three) times daily.    Historical Provider, MD  clopidogrel (PLAVIX) 75 MG tablet Take 1 tablet (75 mg total) by mouth daily. 10/10/14   Ashly Hulen Skains, DO  colchicine 0.6 MG tablet Take 0.6 mg by mouth every 12 (twelve) hours as needed (gout).     Historical Provider, MD  dorzolamide-timolol (COSOPT) 22.3-6.8 MG/ML ophthalmic solution Place 1 drop into both eyes 3 (three) times daily.    Historical Provider, MD  escitalopram (LEXAPRO) 10 MG tablet Take 10 mg by mouth daily.    Historical Provider, MD  fentaNYL (DURAGESIC - DOSED MCG/HR) 50 MCG/HR Apply one patch topically every 72 hours for pain. Remove old patch 10/08/14   Tiffany L Reed, DO  gabapentin (NEURONTIN) 100 MG capsule Take 100 mg by mouth every 12 (twelve) hours as needed (neuropathy).  12/29/13   Historical Provider, MD  latanoprost (XALATAN) 0.005 % ophthalmic solution Place 1 drop into both eyes at bedtime.  04/20/14   Historical Provider,  MD  Menthol, Topical Analgesic, 4 % GEL Apply 1 application topically every 6 (six) hours as needed (pain). Apply to shoulders/lower back  Historical Provider, MD  multivitamin (RENA-VIT) TABS tablet Take 1 tablet by mouth at bedtime. 02/21/15   Catarina Hartshorn, MD  nitroGLYCERIN (NITROSTAT) 0.4 MG SL tablet Place 1 tablet (0.4 mg total) under the tongue every 5 (five) minutes as needed for chest pain. 05/24/13   Rhonda G Barrett, PA-C  sulindac (CLINORIL) 200 MG tablet Take 200 mg by mouth 2 (two) times daily.    Historical Provider, MD   BP 119/58 mmHg  Pulse 80  Temp(Src) 98.6 F (37 C) (Oral)  Resp 13  Ht 5\' 9"  (1.753 m)  Wt 227 lb (102.967 kg)  BMI 33.51 kg/m2  SpO2 97% Physical Exam  Constitutional: He appears well-developed.  Dry cracked tongue  HENT:  Head: Atraumatic.  Eyes: Pupils are equal, round, and reactive to light.  Neck: Neck supple.  Scars from multiple previous central lines.  Cardiovascular: Normal rate.   Pulmonary/Chest: Effort normal.  Few scattered rales.  Abdominal: There is no tenderness.  Musculoskeletal: He exhibits no edema.  Neurological: He is alert.  Patient is alert but slow to answer. Able to tell me his name. Able squeeze hands to commands but somewhat delayed.    ED Course  Procedures (including critical care time) Labs Review Labs Reviewed  COMPREHENSIVE METABOLIC PANEL - Abnormal; Notable for the following:    Chloride 95 (*)    Glucose, Bld 104 (*)    BUN 29 (*)    Creatinine, Ser 4.86 (*)    Calcium 8.7 (*)    Albumin 2.3 (*)    AST 93 (*)    Alkaline Phosphatase 260 (*)    Total Bilirubin 7.8 (*)    GFR calc non Af Amer 12 (*)    GFR calc Af Amer 14 (*)    All other components within normal limits  CBC WITH DIFFERENTIAL/PLATELET - Abnormal; Notable for the following:    WBC 15.0 (*)    RBC 3.71 (*)    Hemoglobin 10.3 (*)    HCT 31.5 (*)    RDW 19.7 (*)    Neutro Abs 13.7 (*)    Lymphs Abs 0.4 (*)    All other components  within normal limits  TROPONIN I - Abnormal; Notable for the following:    Troponin I 0.61 (*)    All other components within normal limits  TROPONIN I - Abnormal; Notable for the following:    Troponin I 0.62 (*)    All other components within normal limits  I-STAT TROPOININ, ED - Abnormal; Notable for the following:    Troponin i, poc 1.24 (*)    All other components within normal limits  I-STAT CG4 LACTIC ACID, ED - Abnormal; Notable for the following:    Lactic Acid, Venous 2.26 (*)    All other components within normal limits  MRSA PCR SCREENING  CULTURE, BLOOD (ROUTINE X 2)  CULTURE, BLOOD (ROUTINE X 2)  CULTURE, BLOOD (ROUTINE X 2)  CULTURE, BLOOD (ROUTINE X 2)  CULTURE, EXPECTORATED SPUTUM-ASSESSMENT  GRAM STAIN  AMMONIA  PROTIME-INR  LACTIC ACID, PLASMA  HIV ANTIBODY (ROUTINE TESTING)  LEGIONELLA PNEUMOPHILA SEROGP 1 UR AG  STREP PNEUMONIAE URINARY ANTIGEN    Imaging Review Ct Head Wo Contrast  03/27/2015  CLINICAL DATA:  Altered mental status. EXAM: CT HEAD WITHOUT CONTRAST TECHNIQUE: Contiguous axial images were obtained from the base of the skull through the vertex without intravenous contrast. COMPARISON:  CT scan of February 18, 2015. FINDINGS: Stable opacification of left maxillary sinus consistent with sinusitis  or mucocele. Bony calvarium is intact. Mild diffuse cortical atrophy is noted. Mild chronic ischemic white matter disease is noted. No mass effect or midline shift is noted. Ventricular size is within normal limits. There is no evidence of mass lesion, hemorrhage or acute infarction. IMPRESSION: Stable opacification of left maxillary sinus consistent with sinusitis or mucocele. Mild diffuse cortical atrophy. Mild chronic ischemic white matter disease. No acute intracranial abnormality seen. Electronically Signed   By: Lupita Raider, M.D.   On: 03/27/2015 16:02   Dg Chest Portable 1 View  03/27/2015  CLINICAL DATA:  Hypotension with lethargy.  Chronic renal  failure EXAM: PORTABLE CHEST 1 VIEW COMPARISON:  February 19, 2015 FINDINGS: There is patchy infiltrate in the right base. The lungs elsewhere are clear. Heart is mildly enlarged with pulmonary vascularity within normal limits. No adenopathy. 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 IMPRESSION: Right base infiltrate. Lungs elsewhere clear. Stable cardiac prominence. 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 Electronically Signed   By: Bretta Bang III M.D.   On: 03/27/2015 14:52   I have personally reviewed and evaluated these images and lab results as part of my medical decision-making.   EKG Interpretation   Date/Time:  Wednesday March 27 2015 14:50:40 EST Ventricular Rate:  91 PR Interval:  150 QRS Duration: 122 QT Interval:  428 QTC Calculation: 527 R Axis:   64 Text Interpretation:  Sinus rhythm Nonspecific intraventricular conduction  delay Abnormal inferior Q waves Probable anteroseptal infarct, old  Nonspecific T abnormalities, lateral leads Confirmed by Leena Tiede  MD,  Britania Shreeve [phone]) on 03/27/2015 2:54:38 PM      MDM   Final diagnoses:  HCAP (healthcare-associated pneumonia)  End stage renal disease on dialysis (HCC)  Encephalopathy  Elevated troponin    Patient with AMS. Pneumonia on xrya. Elevated trop, but reassuring EKG .  Previous encephalopathy with infection and meds. BP improved with fluids. dpdn't get all dialysis. Admit to stepdown.   CRITICAL CARE Performed by: Billee Cashing Total critical care time: 30 minutes Critical care time was exclusive of separately billable procedures and treating other patients. Critical care was necessary to treat or prevent imminent or life-threatening deterioration. Critical care was time spent personally by me on the following activities: development of treatment plan with patient and/or  surrogate as well as nursing, discussions with consultants, evaluation of patient's response to treatment, examination of patient, obtaining history from patient or surrogate, ordering and performing treatments and interventions, ordering and review of laboratory studies, ordering and review of radiographic studies, pulse oximetry and re-evaluation of patient's condition.    Benjiman Core, MD 03/28/15 1002

## 2015-03-28 ENCOUNTER — Encounter (HOSPITAL_COMMUNITY): Payer: Self-pay

## 2015-03-28 ENCOUNTER — Inpatient Hospital Stay (HOSPITAL_COMMUNITY): Payer: Medicare Other

## 2015-03-28 DIAGNOSIS — D649 Anemia, unspecified: Secondary | ICD-10-CM

## 2015-03-28 DIAGNOSIS — I2510 Atherosclerotic heart disease of native coronary artery without angina pectoris: Secondary | ICD-10-CM

## 2015-03-28 LAB — RENAL FUNCTION PANEL
Albumin: 2.4 g/dL — ABNORMAL LOW (ref 3.5–5.0)
Anion gap: 11 (ref 5–15)
BUN: 32 mg/dL — ABNORMAL HIGH (ref 6–20)
CALCIUM: 8.5 mg/dL — AB (ref 8.9–10.3)
CO2: 29 mmol/L (ref 22–32)
CREATININE: 3.77 mg/dL — AB (ref 0.61–1.24)
Chloride: 97 mmol/L — ABNORMAL LOW (ref 101–111)
GFR, EST AFRICAN AMERICAN: 19 mL/min — AB (ref >60)
GFR, EST NON AFRICAN AMERICAN: 16 mL/min — AB (ref >60)
Glucose, Bld: 91 mg/dL (ref 65–99)
Phosphorus: 1.9 mg/dL — ABNORMAL LOW (ref 2.5–4.6)
Potassium: 3.6 mmol/L (ref 3.5–5.1)
SODIUM: 137 mmol/L (ref 135–145)

## 2015-03-28 LAB — CBC
HCT: 36 % — ABNORMAL LOW (ref 39.0–52.0)
Hemoglobin: 11.8 g/dL — ABNORMAL LOW (ref 13.0–17.0)
MCH: 27.1 pg (ref 26.0–34.0)
MCHC: 32.8 g/dL (ref 30.0–36.0)
MCV: 82.6 fL (ref 78.0–100.0)
PLATELETS: 230 10*3/uL (ref 150–400)
RBC: 4.36 MIL/uL (ref 4.22–5.81)
RDW: 18.9 % — AB (ref 11.5–15.5)
WBC: 11.2 10*3/uL — AB (ref 4.0–10.5)

## 2015-03-28 LAB — PROTIME-INR
INR: 1.14 (ref 0.00–1.49)
Prothrombin Time: 14.8 seconds (ref 11.6–15.2)

## 2015-03-28 LAB — MRSA PCR SCREENING: MRSA BY PCR: NEGATIVE

## 2015-03-28 LAB — AMMONIA: Ammonia: 34 umol/L (ref 9–35)

## 2015-03-28 LAB — TROPONIN I
Troponin I: 0.61 ng/mL (ref <0.031)
Troponin I: 0.62 ng/mL (ref <0.031)

## 2015-03-28 LAB — LACTIC ACID, PLASMA: Lactic Acid, Venous: 1.2 mmol/L (ref 0.5–2.0)

## 2015-03-28 LAB — HIV ANTIBODY (ROUTINE TESTING W REFLEX): HIV SCREEN 4TH GENERATION: NONREACTIVE

## 2015-03-28 MED ORDER — HEPARIN SODIUM (PORCINE) 1000 UNIT/ML DIALYSIS: 1000.0000 [IU] | INTRAMUSCULAR | Status: DC | PRN

## 2015-03-28 MED ORDER — DEXTROSE 5 % IV SOLN
2.0000 g | INTRAVENOUS | Status: DC
  Filled 2015-03-28 (×2): qty 2

## 2015-03-28 MED ORDER — SODIUM CHLORIDE 0.9 % IV SOLN: 100.0000 mL | INTRAVENOUS | Status: DC | PRN

## 2015-03-28 MED ORDER — VANCOMYCIN HCL IN DEXTROSE 1-5 GM/200ML-% IV SOLN
1000.0000 mg | INTRAVENOUS | Status: DC
  Filled 2015-03-28: qty 200

## 2015-03-28 MED ORDER — IPRATROPIUM-ALBUTEROL 0.5-2.5 (3) MG/3ML IN SOLN
3.0000 mL | Freq: Two times a day (BID) | RESPIRATORY_TRACT | Status: DC
Start: 2015-03-29 — End: 2015-04-02
  Administered 2015-03-29 – 2015-04-02 (×9): 3 mL via RESPIRATORY_TRACT
  Filled 2015-03-28 (×10): qty 3

## 2015-03-28 MED ORDER — LIDOCAINE HCL (PF) 1 % IJ SOLN: 5.0000 mL | INTRAMUSCULAR | Status: DC | PRN

## 2015-03-28 MED ORDER — VANCOMYCIN HCL IN DEXTROSE 1-5 GM/200ML-% IV SOLN
1000.0000 mg | INTRAVENOUS | Status: DC
  Administered 2015-03-29: 1000 mg via INTRAVENOUS
  Filled 2015-03-28 (×2): qty 200

## 2015-03-28 MED ORDER — LIDOCAINE-PRILOCAINE 2.5-2.5 % EX CREA
1.0000 "application " | TOPICAL_CREAM | CUTANEOUS | Status: DC | PRN
  Filled 2015-03-28: qty 5

## 2015-03-28 MED ORDER — PENTAFLUOROPROP-TETRAFLUOROETH EX AERO: 1.0000 "application " | INHALATION_SPRAY | CUTANEOUS | Status: DC | PRN

## 2015-03-28 MED ORDER — DEXTROSE 5 % IV SOLN
1.0000 g | INTRAVENOUS | Status: DC
  Filled 2015-03-28: qty 1

## 2015-03-28 MED ORDER — ALTEPLASE 2 MG IJ SOLR
2.0000 mg | Freq: Once | INTRAMUSCULAR | Status: DC | PRN
  Filled 2015-03-28: qty 2

## 2015-03-28 MED ORDER — CETYLPYRIDINIUM CHLORIDE 0.05 % MT LIQD
7.0000 mL | Freq: Two times a day (BID) | OROMUCOSAL | Status: DC
  Administered 2015-03-28 – 2015-04-02 (×12): 7 mL via OROMUCOSAL

## 2015-03-28 NOTE — Evaluation (Signed)
Clinical/Bedside Swallow Evaluation Patient Details  Name: [name] MRN: [id number] Date of Birth: 1955-08-05  Today's Date: 03/28/2015 Time: SLP Start Time (ACUTE ONLY): 1333 SLP Stop Time (ACUTE ONLY): 1348 SLP Time Calculation (min) (ACUTE ONLY): 15 min  Past Medical History:  Past Medical History  Diagnosis Date  . Anxiety   . Back pain   . GERD (gastroesophageal reflux disease)   . Peripheral neuropathy (HCC)   . Arthritis   . Chronic diastolic CHF (congestive heart failure) (HCC)     a. 04/2013 Echo: EF nl, no rwma, mild LVH.  Marland Kitchen Active smoker   . ESRD on hemodialysis (HCC)     [name] 4 days per week on M-Tu-Wed and Fri.  Started HD in 1998 and has been on HD initially at [facility], then went to [facility], then in [facility], then to [facility] and now is at [facility] for last 10 years.  Has L thigh AVG.     . Diabetes mellitus     diet controlled  . 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 PONV (postoperative nausea and vomiting)   . Hypertension   . Bell palsy   . Carpal tunnel syndrome     bilateral  . Palpitations   . Headache(784.0)     Hx: of Migraines  . CAD (coronary artery disease)     a. 04/2013 Ant STEMI/PCI: LM nl, LAD 95p (3.0x18 Xience DES), D1/2/3 small, LCX 80ost, RI 20p, RCA 170m CTO (unsuccessful PCI), EF 55%, mod basal inf HK.  . S/P coronary artery stent placement 05/21/2013    Xience Alpine Medtronic conditional 5 @ 1.5 and 3T   . Mucocele of maxillary sinus    Past Surgical History:  Past Surgical History  Procedure Laterality Date  . Total hip arthroplasty    . Thyroidectomy    . Tooth extraction    . Mandible fracture surgery    . Dg av dialysis graft declot or    . Insertion of dialysis catheter  03/28/2011    Procedure: INSERTION OF DIALYSIS CATHETER;  Surgeon: [name] Ochoa, MD;  Location: [facility] OR;  Service: Vascular;  Laterality: Left;  . Av fistula placement  03/31/2011    Procedure: INSERTION OF ARTERIOVENOUS (AV)  GORE-TEX GRAFT THIGH;  Surgeon: [name] Kerns, MD;  Location: [facility] OR;  Service: Vascular;  Laterality: Right;  redo right thigh arteriovenous gortex graft using gore-tex stretch 6mm x 70cm  . Thrombectomy w/ embolectomy  06/02/2011    Procedure: THROMBECTOMY ARTERIOVENOUS GORE-TEX GRAFT;  Surgeon: [name] Hint, MD;  Location: [facility] OR;  Service: Vascular;  Laterality: Right;  Thrombectomy right thigh arteriovenous gortex graft;  revision by  replacement of large portion of graft with 7mm gore-tex   . Insertion of dialysis catheter  08/28/2011    Procedure: INSERTION OF DIALYSIS CATHETER;  Surgeon: [name] Hertz, MD;  Location: [facility] OR;  Service: Vascular;  Laterality: Left;  Atempted Bilateral Internal Jugular, Bilateral Subclavin insertion of 55cm Dialysis Catheter Left Femoral  . Insertion of dialysis catheter  12/15/2011    Procedure: INSERTION OF DIALYSIS CATHETER;  Surgeon: [name] Kerns, MD;  Location: [facility] OR;  Service: Vascular;  Laterality: Right;  Insertion of Right Femoral Dialysis Catheter  . Exchange of a dialysis catheter  02/26/2012    Procedure: EXCHANGE OF A DIALYSIS CATHETER;  Surgeon: [name] Earthly, MD;  Location: [facility] OR;  Service: Vascular;  Laterality: Right;  . Joint replacement    . Exchange of a dialysis catheter  05/18/2012    Procedure: EXCHANGE OF A DIALYSIS CATHETER;  Surgeon: [name] Earthly, MD;  Location: [facility] OR;  Service: Vascular;  Laterality: Right;  right femoral dialysis catheter  . Av fistula placement  05/18/2012    Procedure: INSERTION OF ARTERIOVENOUS (AV) GORE-TEX GRAFT THIGH;  Surgeon: [name] Earthly, MD;  Location: [facility] OR;  Service: Vascular;  Laterality: Left;  using 6mm by 70cm goretex graft  . Thrombectomy w/ embolectomy Left 08/25/2012    Procedure: THROMBECTOMY ARTERIOVENOUS GORE-TEX GRAFT;  Surgeon: [name] Ochoa, MD;  Location: [facility] OR;  Service: Vascular;  Laterality: Left;  Attempted thrombectomy of left thigh arteriovenous gortex graft.   . Av fistula placement  Left 08/25/2012    Procedure: INSERTION OF ARTERIOVENOUS (AV) GORE-TEX GRAFT THIGH;  Surgeon: [name] Ochoa, MD;  Location: [facility] OR;  Service: Vascular;  Laterality: Left;  Using 6mm x 40cm vascular Gortex graft.  . Revision of arteriovenous goretex graft Left 12/14/2012    Procedure: Revision of Left Thigh Graft;  Surgeon: [name] Earthly, MD;  Location: [facility] OR;  Service: Vascular;  Laterality: Left;  . Avgg removal Left 02/16/2013    Procedure: EXCISION OF LEFT ARM ARTERIOVENOUS GORETEX GRAFT TIMES 2 WITH VEIN PATCH ANGIOPLASTY OF BRACIAL ARTERY.  ;  Surgeon: [name] Hint, MD;  Location: [facility] OR;  Service: Vascular;  Laterality: Left;  Converted from MAC to General.    . Revision of arteriovenous goretex graft Left 08/08/2013    Procedure: REVISION OF LEFT THIGH ARTERIOVENOUS GORETEX GRAFT;  Surgeon: [name] Kerns, MD;  Location: [facility] OR;  Service: Vascular;  Laterality: Left;  . Venogram Bilateral 10/27/2011    Procedure: VENOGRAM;  Surgeon: [name] Libman, MD;  Location: [facility] CATH LAB;  Service: Cardiovascular;  Laterality: Bilateral;  bilat upper extrem venograms  . Left heart cath Bilateral 05/21/2013    Procedure: LEFT HEART CATH;  Surgeon: [name] Ouch, MD;  Location: [facility] CATH LAB;  Service: Cardiovascular;  Laterality: Bilateral;  . Percutaneous coronary stent intervention (pci-s)  05/21/2013    Procedure: PERCUTANEOUS CORONARY STENT INTERVENTION (PCI-S);  Surgeon: [name] Ouch, MD;  Location: [facility] CATH LAB;  Service: Cardiovascular;;  . Coronary angioplasty with stent placement    . Radiology with anesthesia N/A 06/27/2014    Procedure: MRI LUMBER WITHOUT CONTRAST;  Surgeon: [name] Radiologist, MD;  Location: [facility] OR;  Service: Radiology;  Laterality: N/A;  . Revision of arteriovenous goretex graft Left 08/13/2014    Procedure: REVISION OF ARTERIOVENOUS GORETEX GRAFT LEFT THIGH;  Surgeon: [name] Kerns, MD;  Location: [facility] OR;  Service: Vascular;  Laterality: Left;  . Radiology with  anesthesia N/A 08/16/2014    Procedure: MRI;  Surgeon: [name] Radiologist, MD;  Location: [facility] OR;  Service: Radiology;  Laterality: N/A;  . Thrombectomy w/ embolectomy Left 08/22/2014    Procedure: THROMBECTOMY ARTERIOVENOUS GORE-TEX GRAFT Of Left Thigh Graft;  Surgeon: [name] Earthly, MD;  Location: [facility] OR;  Service: Vascular;  Laterality: Left;  . Peripheral vascular catheterization N/A 10/09/2014    Procedure: Shuntogram;  Surgeon: [name] Libman, MD;  Location: [facility] INVASIVE CV LAB;  Service: Cardiovascular;  Laterality: N/A;  . Av fistula placement Left 10/30/2014    Procedure: INSERTION OF LEFT THIGH GRAFT;  Surgeon: [name] Libman, MD;  Location: [facility] OR;  Service: Vascular;  Laterality: Left;  . Removal of graft Left 10/30/2014    Procedure:  REMOVAL OF LEFT THIGH GRAFT;  Surgeon: [name] Libman, MD;  Location: [facility] OR;  Service: Vascular;  Laterality: Left;  . Peripheral vascular catheterization Left 01/01/2015    Procedure:  Shuntogram;  Surgeon: [name] Libman, MD;  Location: [facility] INVASIVE CV LAB;  Service: Cardiovascular;  Laterality: Left;   HPI:  59 y.o. male with a past medical history of CHF, end-stage renal disease, diabetes diet-controlled, hypertension, GERD, Bells Palsy presents to the emergency department from dialysis with acute altered mental status and hypotension. Initial evaluation in the emergency department reveals acute respiratory failure likely related to healthcare associated pneumonia, elevated troponin. CT no acute intracranial abnormality seen. CXR right base infiltrate. Lungs elsewhere clear   Assessment / Plan / Recommendation Clinical Impression  Pt awake yet drowsy requiring repetitions and additional response time due to processing delay. Eructation following most bites/sips indicative of esophageal component (hx of GERD). No overt s/s aspiration however cognitive challenges increase risk. RN reported sister fed pt breakfast without s/s aspiration. Endentulous, pt denies  having dentures. Pt should sit upright, consume po's only when alert and aware, small bites/sips, meds whole in applesauce and  continue regular texture and thin liquids. ST follow up next date.      Aspiration Risk   (mild-mod)    Diet Recommendation   Regular, thin, swallow precautions  [name] Administration: Whole meds with puree    Other  Recommendations Oral Care Recommendations: Oral care BID   Follow up Recommendations  Skilled Nursing facility    Frequency and Duration min 2x/week  2 weeks       Prognosis Prognosis for Safe Diet Advancement: Good Barriers to Reach Goals: Cognitive deficits      Swallow Study   General HPI: 59 y.o. male with a past medical history of CHF, end-stage renal disease, diabetes diet-controlled, hypertension, GERD, Bells Palsy presents to the emergency department from dialysis with acute altered mental status and hypotension. Initial evaluation in the emergency department reveals acute respiratory failure likely related to healthcare associated pneumonia, elevated troponin. CT no acute intracranial abnormality seen. CXR right base infiltrate. Lungs elsewhere clear Type of Study: Bedside Swallow Evaluation Previous Swallow Assessment:  (none found) Diet Prior to this Study: Regular;Thin liquids Temperature Spikes Noted: No Respiratory Status: Nasal cannula History of Recent Intubation: No Behavior/Cognition: Lethargic/Drowsy;Cooperative;Requires cueing (drowsy) Oral Cavity Assessment:  (white hue on tongue ) Oral Care Completed by SLP: Yes Oral Cavity - Dentition: Edentulous Vision:  (tbd) Self-Feeding Abilities: Total assist Patient Positioning: Upright in bed Baseline Vocal Quality: Low vocal intensity Volitional Cough: Cognitively unable to elicit Volitional Swallow: Able to elicit    Oral/Motor/Sensory Function Overall Oral Motor/Sensory Function: Moderate impairment (hx of Bells Palsy) Lingual ROM:  (unable )   Ice Chips Ice chips:  Impaired Oral Phase Impairments: Reduced lingual movement/coordination Pharyngeal Phase Impairments: Suspected delayed Swallow   Thin Liquid Thin Liquid: Impaired Presentation: Cup;Straw Pharyngeal  Phase Impairments: Suspected delayed Swallow    Nectar Thick Nectar Thick Liquid: Not tested   Honey Thick Honey Thick Liquid: Not tested   Puree Puree: Within functional limits   Solid Solid: Not tested       Royce Macadamia 03/28/2015,2:21 PM  Breck Coons Lonell Face.Ed [employer] [phone]

## 2015-03-28 NOTE — Progress Notes (Signed)
Utilization Review Completed.  

## 2015-03-28 NOTE — Progress Notes (Signed)
PROGRESS NOTE  [name] MHW:[id number] DOB: 07/04/1955 DOA: 03/27/2015 PCP: Dyke Maes, MD   HPI: 59 yo M with diastolic CHF, ESRD, DM, HTN, CAD, admitted with AMS, hypotension shortly after HD  Subjective / 24 H Interval events - mental status clearing overnight per RN, however still very confused - denies pain  Assessment/Plan: Principal Problem:   Acute respiratory failure with hypoxia (HCC) Active Problems:   End-stage renal disease on hemodialysis (HCC)   HTN (hypertension)   CAD (coronary artery disease)   Elevated troponin   Abnormal liver function test   Chronic HF (heart failure) (HCC)   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  - roponins chronically elevated but current level slightly above baseline. Denies chest pain. 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 Likely related to above. CT of the head without acute changes - Antibiotics, dialysis  ESRD  - Only 20 minutes of dialysis 11/30 - nephrology following, discussed with Dr. Arlean Hopping  Abnormal LFTs. History of same. - Alkaline phosphatase and total bilirubin elevated from 1 month ago - has known liver disease due to hepatitis C, reported cirrhosis however I don't see imaging characteristic of cirrhosis. [place] pending.   Chronic diastolic heart failure - Recent echo with an EF of 55%. - Monitor intake and output - Daily weight  Chronic pain. History of back pain - Home medications include  gabapentin and fentanyl patch.  - hold fentanyl patch due to AMS - appreciate palliative consult also for symptom control  Diabetes type 2. Controlled - Diet controlled - Recent A1c 5.6 - Monitor CBG G's and use sliding scale insulin as indicated  Anemia, normocytic. Related to chronic illness - no Signs symptoms of active bleeding - Monitor  Sinus mucocele. Left per CT.  - stable - ENT consult obtained at last hospitalization. They opined likely related to chronic sinusitis recommend outpatient workup  Goals of care - have asked palliative help. He has a series of chronic medical problems, recurrent pneumonia and difficulties with dialysis   Diet: Diet renal with fluid restriction Fluid restriction:: 1200 mL Fluid; Room service appropriate?: Yes; Fluid consistency:: Thin Fluids: none  DVT Prophylaxis: SCD  Code Status: Full Code Family Communication: no family bedside, call without answer  Disposition Plan: inpatient  Barriers to discharge: MMP  Consultants:  Nephrology   Palliative  Procedures:  None    Antibiotics Vancomycin 11/30 >> Elita Quick 11/30 >>   Studies  Ct Head Wo Contrast  03/27/2015  CLINICAL DATA:  Altered mental status. EXAM: CT HEAD WITHOUT CONTRAST TECHNIQUE: Contiguous axial images were obtained from the base of the skull through the vertex without intravenous contrast. COMPARISON:  CT scan of February 18, 2015. FINDINGS: Stable opacification of left maxillary sinus consistent with sinusitis or mucocele. Bony calvarium is intact. Mild diffuse cortical atrophy is noted. Mild chronic ischemic white matter disease is noted. No mass effect or midline shift is noted. Ventricular size is within normal limits. There is no evidence of mass lesion, hemorrhage or acute infarction. IMPRESSION: Stable  opacification of left maxillary sinus consistent with sinusitis or mucocele. Mild diffuse cortical atrophy. Mild chronic ischemic white matter disease. No acute  intracranial abnormality seen. Electronically Signed   By: Lupita Raider, M.D.   On: 03/27/2015 16:02   Dg Chest Portable 1 View  03/27/2015  CLINICAL DATA:  Hypotension with lethargy.  Chronic renal failure EXAM: PORTABLE CHEST 1 VIEW COMPARISON:  February 19, 2015 FINDINGS: There is patchy infiltrate in the right base. The lungs elsewhere are clear. Heart is mildly enlarged with pulmonary vascularity within normal limits. No adenopathy. 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 IMPRESSION: Right base infiltrate. Lungs elsewhere clear. Stable cardiac prominence. 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 Electronically Signed   By: Bretta Bang III M.D.   On: 03/27/2015 14:52    Objective  Filed Vitals:   03/28/15 0247 03/28/15 0400 03/28/15 0809 03/28/15 0838  BP:  110/69  119/58  Pulse:   80 80  Temp:  98.3 F (36.8 C)  98.6 F (37 C)  TempSrc:  Axillary  Oral  Resp:  Height:      Weight:      SpO2: 100% 99% 98% 97%    Intake/Output Summary (Last 24 hours) at 03/28/15 1131 Last data filed at 03/28/15 0050  Gross per 24 hour  Intake     50 ml  Output   2028 ml  Net  -1978 ml   Filed Weights   03/27/15 1356  Weight: 102.967 kg (227 lb)    Exam:  GENERAL: NAD, confused  HEENT: + scleral icterus, PERRL  NECK: supple, no LAD  LUNGS: CTA biL, no wheezing  HEART: RRR without MRG  ABDOMEN: soft, non tender  EXTREMITIES: no clubbing / cyanosis  NEUROLOGIC: not following commands   Data Reviewed: Basic Metabolic Panel:  Recent Labs Lab 03/27/15 1356  NA 137  K 4.5  CL 95*  CO2 32  GLUCOSE 104*  BUN 29*  CREATININE 4.86*  CALCIUM 8.7*   Liver Function Tests:  Recent Labs Lab 03/27/15 1356  AST 93*  ALT 26  ALKPHOS 260*  BILITOT 7.8*  PROT 7.4  ALBUMIN 2.3*    Recent Labs Lab 03/28/15 0033  AMMONIA 34     CBC:  Recent Labs Lab 03/27/15 1356  WBC 15.0*  NEUTROABS 13.7*  HGB 10.3*  HCT 31.5*  MCV 84.9  PLT 212   Cardiac Enzymes:  Recent Labs Lab 03/28/15 0033 03/28/15 0238  TROPONINI 0.61* 0.62*    Recent Results (from the past 240 hour(s))  MRSA PCR Screening     Status: None   Collection Time: 03/27/15 11:25 PM  Result Value Ref Range Status   MRSA by PCR NEGATIVE NEGATIVE Final    Comment:        The GeneXpert MRSA Assay (FDA approved for NASAL specimens only), is one component of a comprehensive MRSA colonization surveillance program. It is not intended to diagnose MRSA infection nor to guide or monitor treatment for MRSA infections.      Scheduled Meds: . antiseptic oral rinse  7 mL Mouth Rinse BID  . aspirin  81 mg Oral Daily  . atorvastatin  80 mg Oral q1800  . B-complex with vitamin C  1 tablet Oral QHS  . calcium carbonate  3 tablet Oral TID WC  . cefTAZidime (FORTAZ)  IV  1 g  Intravenous Q24H  . clopidogrel  75 mg Oral Daily  . darbepoetin (ARANESP) injection - DIALYSIS  60 mcg Intravenous Q Wed-HD  . dorzolamide-timolol  1 drop Both Eyes TID  . feeding supplement (PRO-STAT SUGAR FREE 64)  30 mL Oral BID  . [START ON 03/29/2015] ferric gluconate (FERRLECIT/NULECIT) IV  125 mg Intravenous Q M,W,F-HD  . ipratropium-albuterol  3 mL Nebulization Q6H  . latanoprost  1 drop Both Eyes QHS  . multivitamin  1 tablet Oral QHS  . sulindac  200 mg Oral BID WC   Continuous Infusions:    Pamella Pert, MD [facility] Pager [phone]. If 7 PM - 7 AM, please contact night-coverage at [email or web address], password [facility] 03/28/2015, 11:31 AM  LOS: 1 day

## 2015-03-28 NOTE — Progress Notes (Addendum)
  [facility] Progress Note   Subjective: lethargic, confused  Filed Vitals:   03/28/15 0247 03/28/15 0400 03/28/15 0809 03/28/15 0838  BP:  110/69  119/58  Pulse:   80 80  Temp:  98.3 F (36.8 C)  98.6 F (37 C)  TempSrc:  Axillary  Oral  Resp:  13 21 13   Height:      Weight:      SpO2: 100% 99% 98% 97%    Inpatient medications: . antiseptic oral rinse  7 mL Mouth Rinse BID  . aspirin  81 mg Oral Daily  . atorvastatin  80 mg Oral q1800  . B-complex with vitamin C  1 tablet Oral QHS  . calcium carbonate  3 tablet Oral TID WC  . cefTAZidime (FORTAZ)  IV  1 g Intravenous Q24H  . clopidogrel  75 mg Oral Daily  . Darbepoetin Alfa      . darbepoetin (ARANESP) injection - DIALYSIS  60 mcg Intravenous Q Wed-HD  . dorzolamide-timolol  1 drop Both Eyes TID  . feeding supplement (PRO-STAT SUGAR FREE 64)  30 mL Oral BID  . fentaNYL  50 mcg Transdermal Q72H  . [START ON 03/29/2015] ferric gluconate (FERRLECIT/NULECIT) IV  125 mg Intravenous Q M,W,F-HD  . ipratropium-albuterol  3 mL Nebulization Q6H  . latanoprost  1 drop Both Eyes QHS  . multivitamin  1 tablet Oral QHS  . sulindac  200 mg Oral BID WC     colchicine  Exam: Very lethargic, no distress, quiet verbal Throat dry Chest mostly clear bilat, no rales/ bronchial BS RRR no RG Abd obese ntnd +bs soft GU normal Bilat LE pretib edema 1+ , no leg wounds or ulcers  L fem AVG +bruit Neuro nonfocal, opens eyes, whispers, "2012", "this place" +myoclonic jerking  MWF [facility] 4.5h 98kg 2/2.25 bath Heparin none L fem AVG Hect 2 ug Mircera 50 q2wk, due today Venofer 100/ wk  BP's at OP HD normal 120-140 systolic   Assessment: 1 AMS possible PNA on abx, vs narc excess 2 Hypotension 3 Hypoxemia 4 Morbid obesity 5 ESRD leaving below dry wt 6 Vol - up 3-4 kg by wt 7 CAD hx stents 8 Chron pain on duragesic patch  Plan - looks over narcotized, prob should dc duragesic patch. HD Friday, UF 2-3kg as  Caryl Pina MD [facility] pager [phone]    cell [phone] 03/28/2015, 10:07 AM    Recent Labs Lab 03/27/15 1356  NA 137  K 4.5  CL 95*  CO2 32  GLUCOSE 104*  BUN 29*  CREATININE 4.86*  CALCIUM 8.7*    Recent Labs Lab 03/27/15 1356  AST 93*  ALT 26  ALKPHOS 260*  BILITOT 7.8*  PROT 7.4  ALBUMIN 2.3*    Recent Labs Lab 03/27/15 1356  WBC 15.0*  NEUTROABS 13.7*  HGB 10.3*  HCT 31.5*  MCV 84.9  PLT 212

## 2015-03-29 ENCOUNTER — Inpatient Hospital Stay (HOSPITAL_COMMUNITY): Payer: Medicare Other

## 2015-03-29 ENCOUNTER — Encounter (HOSPITAL_COMMUNITY): Payer: Self-pay | Admitting: Nephrology

## 2015-03-29 DIAGNOSIS — R0600 Dyspnea, unspecified: Secondary | ICD-10-CM

## 2015-03-29 DIAGNOSIS — Z515 Encounter for palliative care: Secondary | ICD-10-CM | POA: Insufficient documentation

## 2015-03-29 LAB — CBC
HCT: 33 % — ABNORMAL LOW (ref 39.0–52.0)
Hemoglobin: 11 g/dL — ABNORMAL LOW (ref 13.0–17.0)
MCH: 27.6 pg (ref 26.0–34.0)
MCHC: 33.3 g/dL (ref 30.0–36.0)
MCV: 82.9 fL (ref 78.0–100.0)
PLATELETS: 266 10*3/uL (ref 150–400)
RBC: 3.98 MIL/uL — AB (ref 4.22–5.81)
RDW: 18.6 % — ABNORMAL HIGH (ref 11.5–15.5)
WBC: 13.6 10*3/uL — ABNORMAL HIGH (ref 4.0–10.5)

## 2015-03-29 LAB — COMPREHENSIVE METABOLIC PANEL
ALT: 17 U/L (ref 17–63)
AST: 54 U/L — ABNORMAL HIGH (ref 15–41)
Albumin: 2.3 g/dL — ABNORMAL LOW (ref 3.5–5.0)
Alkaline Phosphatase: 185 U/L — ABNORMAL HIGH (ref 38–126)
Anion gap: 10 (ref 5–15)
BILIRUBIN TOTAL: 2.3 mg/dL — AB (ref 0.3–1.2)
BUN: 46 mg/dL — ABNORMAL HIGH (ref 6–20)
CHLORIDE: 97 mmol/L — AB (ref 101–111)
CO2: 30 mmol/L (ref 22–32)
CREATININE: 4.55 mg/dL — AB (ref 0.61–1.24)
Calcium: 8.5 mg/dL — ABNORMAL LOW (ref 8.9–10.3)
GFR calc non Af Amer: 13 mL/min — ABNORMAL LOW (ref >60)
GFR, EST AFRICAN AMERICAN: 15 mL/min — AB (ref >60)
GLUCOSE: 102 mg/dL — AB (ref 65–99)
Potassium: 3.2 mmol/L — ABNORMAL LOW (ref 3.5–5.1)
Sodium: 137 mmol/L (ref 135–145)
Total Protein: 7.6 g/dL (ref 6.5–8.1)

## 2015-03-29 LAB — MAGNESIUM: Magnesium: 2 mg/dL (ref 1.7–2.4)

## 2015-03-29 MED ORDER — DEXTROSE 5 % IV SOLN
2.0000 g | INTRAVENOUS | Status: DC
  Administered 2015-03-29: 2 g via INTRAVENOUS
  Filled 2015-03-29: qty 2

## 2015-03-29 MED ORDER — PERFLUTREN LIPID MICROSPHERE
1.0000 mL | INTRAVENOUS | Status: AC | PRN
  Administered 2015-03-29: 2 mL via INTRAVENOUS
  Filled 2015-03-29: qty 10

## 2015-03-29 NOTE — Progress Notes (Signed)
SLP Cancellation Note  Patient Details Name: [name] MRN: [id number] DOB: 29-Nov-1955   Cancelled treatment:        Pt in HD when SLP checked on for dysphagia. Will continue efforts.   Royce Macadamia 03/29/2015, 3:08 PM   Breck Coons Lonell Face.Ed [employer] [phone]

## 2015-03-29 NOTE — Progress Notes (Signed)
Pt arrived to unit per bed at 1041.  Report received from bedside RN  Alert to self, confused. Lungs diminished. Generalized edema. Regular R&R  L thigh AVG accessed with two 15 gauge needles.  Pulsation of blood noted.  Tx initiated at 1054 with goal of and net fluid removal of 2L.  Will continue to monitor.

## 2015-03-29 NOTE — Progress Notes (Signed)
  [facility] Progress Note   Subjective: more alert, still confused  Filed Vitals:   03/29/15 0000 03/29/15 0400 03/29/15 0723 03/29/15 0800  BP: 118/51 121/60  128/67  Pulse: 38 77 87 86  Temp: 98.5 F (36.9 C) 99.1 F (37.3 C)  98.7 F (37.1 C)  TempSrc: Oral Oral  Oral  Resp: 13 19 22 23   Height:      Weight:      SpO2: 99% 88% 99% 97%    Inpatient medications: . antiseptic oral rinse  7 mL Mouth Rinse BID  . aspirin  81 mg Oral Daily  . atorvastatin  80 mg Oral q1800  . B-complex with vitamin C  1 tablet Oral QHS  . calcium carbonate  3 tablet Oral TID WC  . cefTAZidime (FORTAZ)  IV  2 g Intravenous Q M,W,F-HD  . clopidogrel  75 mg Oral Daily  . darbepoetin (ARANESP) injection - DIALYSIS  60 mcg Intravenous Q Wed-HD  . dorzolamide-timolol  1 drop Both Eyes TID  . feeding supplement (PRO-STAT SUGAR FREE 64)  30 mL Oral BID  . ferric gluconate (FERRLECIT/NULECIT) IV  125 mg Intravenous Q M,W,F-HD  . ipratropium-albuterol  3 mL Nebulization BID  . latanoprost  1 drop Both Eyes QHS  . multivitamin  1 tablet Oral QHS  . sulindac  200 mg Oral BID WC  . vancomycin  1,000 mg Intravenous Q M,W,F-HD     sodium chloride, sodium chloride, alteplase, colchicine, heparin, lidocaine (PF), lidocaine-prilocaine, pentafluoroprop-tetrafluoroeth, perflutren lipid microspheres (DEFINITY) IV suspension  Exam: Awakens easily, no distress No jvd Chest mostly clear bilat, no rales/ bronchial BS RRR no RG Abd obese ntnd +bs soft GU normal Bilat LE pretib edema 1+ , no leg wounds or ulcers  L fem AVG +bruit Neuro nonfocal, less myoclonic jerking, responsive  MWF [facility] 4.5h 98kg 2/2.25 bath Heparin none L fem AVG Hect 2 ug Mircera 50 q2wk, due today Venofer 100/ wk  BP's at OP HD normal 120-140 systolic   Assessment: 1 AMS possible PNA on abx, vs narc excess 2 Jaundice / hx hep C - new bili^^ 7.9 3 Hypotension resolved 4 Morbid obesity 5 ESRD  6 Vol -  up 3-4 kg by wt 7 CAD hx stents 8 Chron pain duragesic patch on hold 10 Debility/FTT unclear baseline, mobilize  Plan - HD today, more awake off of Duragesic   Vinson Moselle MD [facility] pager [phone]    cell [phone] 03/29/2015, 10:54 AM    Recent Labs Lab 03/27/15 1356 03/28/15 2132  NA 137 137  K 4.5 3.6  CL 95* 97*  CO2 32 29  GLUCOSE 104* 91  BUN 29* 32*  CREATININE 4.86* 3.77*  CALCIUM 8.7* 8.5*  PHOS  --  1.9*    Recent Labs Lab 03/27/15 1356 03/28/15 2132  AST 93*  --   ALT 26  --   ALKPHOS 260*  --   BILITOT 7.8*  --   PROT 7.4  --   ALBUMIN 2.3* 2.4*    Recent Labs Lab 03/27/15 1356 03/28/15 2132  WBC 15.0* 11.2*  NEUTROABS 13.7*  --   HGB 10.3* 11.8*  HCT 31.5* 36.0*  MCV 84.9 82.6  PLT 212 230

## 2015-03-29 NOTE — Progress Notes (Signed)
CRITICAL VALUE ALERT  Critical value received:  + Blood Culture, gram negative rods in aerobic bottle  Date of notification:  03/29/2015  Time of notification:  0552  Critical value read back:Yes.    Nurse who received alert:  Kristeen Miss RN  MD notified (1st page):  Merdis Delay NP  Time of first page:  [phone]  MD notified (2nd page):  Time of second page:  Responding MD:  Merdis Delay NP  Time MD responded:  [phone]

## 2015-03-29 NOTE — Progress Notes (Signed)
Pt in HD. Attempted to call pt's emergency contact, his mother, Tamara Demarest, but no answer or means to leave a message. Per RN, mother and sister have been at the bedside and shared with her that he has married within the year. Will continue to attempt to schedule [facility] family meeting with all pertinent family members. Eduard Roux, ANP

## 2015-03-29 NOTE — Progress Notes (Signed)
Family meeting with mother and sister's scheduled for 03/30/15 at 1100. Will address marital status as well as spouse's contact info at that time. Mother reluctant to share this with me over the phone. Eduard Roux, ANP

## 2015-03-29 NOTE — Progress Notes (Signed)
Echocardiogram 2D Echocardiogram with Definity has been performed.  Bond, Goldfine 03/29/2015, 10:51 AM

## 2015-03-29 NOTE — Progress Notes (Signed)
Tx complete, assessment unchanged.  Report given to bedside RN.  Goal of , total fluid filtered 2L.    Needles pulled and pressure held to L thigh graft.  Vitals entered into EMR.

## 2015-03-29 NOTE — Progress Notes (Signed)
PROGRESS NOTE  [name]:[id number] DOB: 1955/05/09 DOA: 03/27/2015 PCP: Dyke Maes, MD  HPI: 59 yo M with diastolic CHF, ESRD, DM, HTN, CAD, admitted with AMS, hypotension shortly after HD  Subjective / 24 H Interval events - more alert this morning, denies pain, keeps repeating he wants to talk with the RN  Assessment/Plan: Principal Problem:   Acute respiratory failure with hypoxia (HCC) Active Problems:   End-stage renal disease on hemodialysis (HCC)   HTN (hypertension)   CAD (coronary artery disease)   Elevated troponin   Abnormal liver function test   Chronic HF (heart failure) (HCC)   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  - Denies chest pain. 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 Likely related to above. CT of the head without acute changes - Antibiotics, dialysis - improving  ESRD  - Only 20 minutes of dialysis 11/30 - nephrology following, HD per them   Abnormal LFTs. History of same. - Alkaline phosphatase and total bilirubin elevated from 1 month ago - has known liver disease due to hepatitis C, reported cirrhosis however I don't see imaging characteristic of cirrhosis. [place] with multiple gallstones, no acute cholecystitis, CBD @ 3.6 mm  Chronic diastolic heart failure -  Recent echo with an EF of 55%, repeat echo since some ectopy on monitor - Monitor intake and output - Daily weights  Chronic pain. History of back pain - Home medications include gabapentin and fentanyl patch.  - hold fentanyl patch due to AMS - appreciate palliative consult also for symptom control  Diabetes type 2. Controlled - Diet controlled - Recent A1c 5.6 - Monitor CBG G's and use sliding scale insulin as indicated   Anemia, normocytic. Related to chronic illness - no Signs symptoms of active bleeding - Monitor  Sinus mucocele. Left per CT.  - stable - ENT consult obtained at last hospitalization. They opined likely related to chronic sinusitis recommend outpatient workup  Goals of care - have asked palliative help. He has a series of chronic medical problems, recurrent pneumonia and difficulties with dialysis   Diet: Diet renal with fluid restriction Fluid restriction:: 1200 mL Fluid; Room service appropriate?: Yes; Fluid consistency:: Thin Fluids: none  DVT Prophylaxis: SCD  Code Status: Full Code Family Communication: no family bedside Disposition Plan: inpatient  Barriers to discharge: MMP  Consultants:  Nephrology   Palliative  Procedures:  None    Antibiotics Vancomycin 11/30 >> Elita Quick 11/30 >>   Studies  Ct Head Wo Contrast  03/27/2015  CLINICAL DATA:  Altered mental status. EXAM: CT HEAD WITHOUT CONTRAST TECHNIQUE: Contiguous axial images were obtained from the base of the skull through the vertex without intravenous contrast. COMPARISON:  CT scan of February 18, 2015. FINDINGS: Stable opacification of left maxillary sinus consistent with sinusitis or mucocele. Bony calvarium is intact. Mild diffuse cortical atrophy is noted. Mild chronic  ischemic white matter disease is noted. No mass effect or midline shift is noted. Ventricular size is within normal limits. There is no evidence of mass lesion, hemorrhage or acute infarction. IMPRESSION: Stable  opacification of left maxillary sinus consistent with sinusitis or mucocele. Mild diffuse cortical atrophy. Mild chronic ischemic white matter disease. No acute intracranial abnormality seen. Electronically Signed   By: Lupita Raider, M.D.   On: 03/27/2015 16:02   Dg Chest Portable 1 View  03/27/2015  CLINICAL DATA:  Hypotension with lethargy.  Chronic renal failure EXAM: PORTABLE CHEST 1 VIEW COMPARISON:  February 19, 2015 FINDINGS: There is patchy infiltrate in the right base. The lungs elsewhere are clear. Heart is mildly enlarged with pulmonary vascularity within normal limits. No adenopathy. 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 IMPRESSION: Right base infiltrate. Lungs elsewhere clear. Stable cardiac prominence. 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 Electronically Signed   By: Bretta Bang III M.D.   On: 03/27/2015 14:52   US Abdomen Limited Ruq  03/28/2015  CLINICAL DATA:  Elevated liver enzymes. EXAM: US ABDOMEN LIMITED - RIGHT UPPER QUADRANT COMPARISON:  CT of the abdomen and pelvis 08/12/2014 FINDINGS: Gallbladder: Multiple gallstones are present, largest measuring approximately 7 mm. Gallbladder wall is 1.8 mm in thickness. No sonographic Murphy sign or pericholecystic fluid. Common bile duct: Diameter: 3.6 mm Liver: Normal echotexture.  No focal mass. Additional: Right pleural effusion is noted. IMPRESSION: 1. Multiple gallstones without other evidence for acute cholecystitis. 2. Right pleural effusion. Electronically Signed   By: Norva Pavlov M.D.   On: 03/28/2015 19:31    Objective  Filed Vitals:   03/28/15 2002 03/28/15 2056 03/29/15 0000 03/29/15 0400  BP: 143/75  118/51 121/60  Pulse: 85  38 77  Temp:   98.5 F (36.9 C) 99.1 F (37.3 C)  TempSrc:   Oral Oral  Resp:   13 19  Height:      Weight:      SpO2: 100% 100% 99% 88%   No intake  or output data in the 24 hours ending 03/29/15 0654 Filed Weights   03/27/15 1356  Weight: 102.967 kg (227 lb)    Exam:  GENERAL: NAD, confused  HEENT: + scleral icterus, PERRL  NECK: supple, no LAD  LUNGS: CTA biL, no wheezing  HEART: RRR without MRG  ABDOMEN: soft, non tender  EXTREMITIES: no clubbing / cyanosis  NEUROLOGIC: non focal    Data Reviewed: Basic Metabolic Panel:  Recent Labs Lab 03/27/15 1356 03/28/15 2132  NA 137 137  K 4.5 3.6  CL 95* 97*  CO2 32 29  GLUCOSE 104* 91  BUN 29* 32*  CREATININE 4.86* 3.77*  CALCIUM 8.7* 8.5*  PHOS  --  1.9*   Liver Function Tests:  Recent Labs Lab 03/27/15 1356 03/28/15 2132  AST 93*  --   ALT 26  --   ALKPHOS 260*  --   BILITOT 7.8*  --   PROT 7.4  --   ALBUMIN 2.3* 2.4*    Recent Labs Lab 03/28/15 0033  AMMONIA 34   CBC:  Recent Labs Lab 03/27/15 1356 03/28/15 2132  WBC 15.0* 11.2*  NEUTROABS 13.7*  --   HGB 10.3* 11.8*  HCT 31.5* 36.0*  MCV 84.9 82.6  PLT 212 230   Cardiac Enzymes:  Recent Labs Lab 03/28/15 0033 03/28/15 0238  TROPONINI 0.61* 0.62*  Recent Results (from the past 240 hour(s))  Culture, blood (routine x 2)     Status: None (Preliminary result)   Collection Time: 03/27/15  3:50 PM  Result Value Ref Range Status   Specimen Description BLOOD RIGHT FOREARM  Final   Special Requests BOTTLES DRAWN AEROBIC AND ANAEROBIC 5CC  Final   Culture NO GROWTH < 24 HOURS  Final   Report Status PENDING  Incomplete  Culture, blood (routine x 2)     Status: None (Preliminary result)   Collection Time: 03/27/15  3:50 PM  Result Value Ref Range Status   Specimen Description BLOOD RIGHT HAND  Final   Special Requests BOTTLES DRAWN AEROBIC AND ANAEROBIC 5CC  Final   Culture  Setup Time   Final    GRAM NEGATIVE RODS AEROBIC BOTTLE ONLY CRITICAL RESULT CALLED TO, READ BACK BY AND VERIFIED WITH: S WOODARD @0552  03/29/15 MKELLY    Culture NO GROWTH < 24 HOURS  Final   Report  Status PENDING  Incomplete  MRSA PCR Screening     Status: None   Collection Time: 03/27/15 11:25 PM  Result Value Ref Range Status   MRSA by PCR NEGATIVE NEGATIVE Final    Comment:        The GeneXpert MRSA Assay (FDA approved for NASAL specimens only), is one component of a comprehensive MRSA colonization surveillance program. It is not intended to diagnose MRSA infection nor to guide or monitor treatment for MRSA infections.      Scheduled Meds: . antiseptic oral rinse  7 mL Mouth Rinse BID  . aspirin  81 mg Oral Daily  . atorvastatin  80 mg Oral q1800  . B-complex with vitamin C  1 tablet Oral QHS  . calcium carbonate  3 tablet Oral TID WC  . cefTAZidime (FORTAZ)  IV  2 g Intravenous Q M,W,F-HD  . clopidogrel  75 mg Oral Daily  . darbepoetin (ARANESP) injection - DIALYSIS  60 mcg Intravenous Q Wed-HD  . dorzolamide-timolol  1 drop Both Eyes TID  . feeding supplement (PRO-STAT SUGAR FREE 64)  30 mL Oral BID  . ferric gluconate (FERRLECIT/NULECIT) IV  125 mg Intravenous Q M,W,F-HD  . ipratropium-albuterol  3 mL Nebulization BID  . latanoprost  1 drop Both Eyes QHS  . multivitamin  1 tablet Oral QHS  . sulindac  200 mg Oral BID WC  . vancomycin  1,000 mg Intravenous Q M,W,F-HD   Continuous Infusions:    Pamella Pert, MD [facility] Pager [phone]. If 7 PM - 7 AM, please contact night-coverage at [email or web address], password [facility] 03/29/2015, 6:54 AM  LOS: 2 days

## 2015-03-30 DIAGNOSIS — Z515 Encounter for palliative care: Secondary | ICD-10-CM

## 2015-03-30 DIAGNOSIS — L8990 Pressure ulcer of unspecified site, unspecified stage: Secondary | ICD-10-CM | POA: Insufficient documentation

## 2015-03-30 LAB — COMPREHENSIVE METABOLIC PANEL WITH GFR
ALT: 17 U/L (ref 17–63)
AST: 52 U/L — ABNORMAL HIGH (ref 15–41)
Albumin: 2.4 g/dL — ABNORMAL LOW (ref 3.5–5.0)
Alkaline Phosphatase: 174 U/L — ABNORMAL HIGH (ref 38–126)
Anion gap: 12 (ref 5–15)
BUN: 24 mg/dL — ABNORMAL HIGH (ref 6–20)
CO2: 26 mmol/L (ref 22–32)
Calcium: 8.3 mg/dL — ABNORMAL LOW (ref 8.9–10.3)
Chloride: 96 mmol/L — ABNORMAL LOW (ref 101–111)
Creatinine, Ser: 3.13 mg/dL — ABNORMAL HIGH (ref 0.61–1.24)
GFR calc Af Amer: 23 mL/min — ABNORMAL LOW
GFR calc non Af Amer: 20 mL/min — ABNORMAL LOW
Glucose, Bld: 98 mg/dL (ref 65–99)
Potassium: 3.6 mmol/L (ref 3.5–5.1)
Sodium: 134 mmol/L — ABNORMAL LOW (ref 135–145)
Total Bilirubin: 2.2 mg/dL — ABNORMAL HIGH (ref 0.3–1.2)
Total Protein: 8.1 g/dL (ref 6.5–8.1)

## 2015-03-30 LAB — CBC
HCT: 35.6 % — ABNORMAL LOW (ref 39.0–52.0)
Hemoglobin: 11.8 g/dL — ABNORMAL LOW (ref 13.0–17.0)
MCH: 27.6 pg (ref 26.0–34.0)
MCHC: 33.1 g/dL (ref 30.0–36.0)
MCV: 83.2 fL (ref 78.0–100.0)
Platelets: 280 10*3/uL (ref 150–400)
RBC: 4.28 MIL/uL (ref 4.22–5.81)
RDW: 18.7 % — ABNORMAL HIGH (ref 11.5–15.5)
WBC: 16 10*3/uL — ABNORMAL HIGH (ref 4.0–10.5)

## 2015-03-30 MED ORDER — ONDANSETRON HCL 4 MG/2ML IJ SOLN
4.0000 mg | Freq: Four times a day (QID) | INTRAMUSCULAR | Status: DC | PRN
  Administered 2015-03-30: 4 mg via INTRAVENOUS
  Filled 2015-03-30: qty 2

## 2015-03-30 NOTE — Progress Notes (Signed)
  [facility] Progress Note   Subjective: more alert today, no complaints  Filed Vitals:   03/29/15 2336 03/30/15 0458 03/30/15 0734 03/30/15 0800  BP: 104/76 160/86  157/85  Pulse: 79 79 80 81  Temp: 98.6 F (37 C) 97.9 F (36.6 C)  97.6 F (36.4 C)  TempSrc: Oral Oral  Oral  Resp: 17 23 18 17   Height:      Weight:      SpO2: 97% 99% 97% 97%    Inpatient medications: . antiseptic oral rinse  7 mL Mouth Rinse BID  . aspirin  81 mg Oral Daily  . atorvastatin  80 mg Oral q1800  . B-complex with vitamin C  1 tablet Oral QHS  . calcium carbonate  3 tablet Oral TID WC  . cefTAZidime (FORTAZ)  IV  2 g Intravenous Q M,W,F-1800  . clopidogrel  75 mg Oral Daily  . darbepoetin (ARANESP) injection - DIALYSIS  60 mcg Intravenous Q Wed-HD  . dorzolamide-timolol  1 drop Both Eyes TID  . feeding supplement (PRO-STAT SUGAR FREE 64)  30 mL Oral BID  . ferric gluconate (FERRLECIT/NULECIT) IV  125 mg Intravenous Q M,W,F-HD  . ipratropium-albuterol  3 mL Nebulization BID  . latanoprost  1 drop Both Eyes QHS  . multivitamin  1 tablet Oral QHS  . sulindac  200 mg Oral BID WC  . vancomycin  1,000 mg Intravenous Q M,W,F-HD     sodium chloride, sodium chloride, alteplase, colchicine, heparin, lidocaine (PF), lidocaine-prilocaine, pentafluoroprop-tetrafluoroeth  Exam: Awake, tired and quiet No jvd Chest clear bilat RRR no RG Abd obese ntnd +bs soft GU normal Bilat LE pretib edema 1+ , no leg wounds or ulcers  L fem AVG +bruit Neuro nonfocal, myoclonic jerking resolved  MWF [place] 4.5h 98kg 2/2.25 bath Heparin none L fem AVG Hect 2 ug Mircera 50 q2wk, due today Venofer 100/ wk  BP's at OP HD normal 120-140 systolic   Assessment: 1 AMS infection/ narc excess , resolving 2 Jaundice / hx hep C - new bili^^ 7.9 3 Hypotension resolved 4 Morbid obesity 5 ESRD  6 Vol - 6kg under dry 7 CAD hx stents 8 Chron back pain - duragesic patch on hold 10 Debility/FTT  lives in SNF  Plan - HD Monday. Palliative care planning family meeting.    Vinson Moselle MD [facility] pager [phone]    cell [phone] 03/30/2015, 10:53 AM    Recent Labs Lab 03/28/15 2132 03/29/15 1045 03/30/15 0347  NA 137 137 134*  K 3.6 3.2* 3.6  CL 97* 97* 96*  CO2 29 30 26   GLUCOSE 91 102* 98  BUN 32* 46* 24*  CREATININE 3.77* 4.55* 3.13*  CALCIUM 8.5* 8.5* 8.3*  PHOS 1.9*  --   --     Recent Labs Lab 03/27/15 1356 03/28/15 2132 03/29/15 1045 03/30/15 0347  AST 93*  --  54* 52*  ALT 26  --  17 17  ALKPHOS 260*  --  185* 174*  BILITOT 7.8*  --  2.3* 2.2*  PROT 7.4  --  7.6 8.1  ALBUMIN 2.3* 2.4* 2.3* 2.4*    Recent Labs Lab 03/27/15 1356 03/28/15 2132 03/29/15 1045 03/30/15 0347  WBC 15.0* 11.2* 13.6* 16.0*  NEUTROABS 13.7*  --   --   --   HGB 10.3* 11.8* 11.0* 11.8*  HCT 31.5* 36.0* 33.0* 35.6*  MCV 84.9 82.6 82.9 83.2  PLT 212 230 266 280

## 2015-03-30 NOTE — Consult Note (Signed)
Consultation Note Date: 03/30/2015   Patient Name: [name]  DOB: [birth year]  MRN: [id number]  Age / Sex: 59 y.o., male  PCP: Fleet Contras, MD Referring Physician: Caren Griffins, MD  Reason for Consultation: Establishing goals of care    Clinical Assessment/Narrative: Pt is a 59 yo man with h/o ESRD on HD for years, sCHF, hep c with increasing LFT's, CAD with stent placement, recurrent PNA, and now positive blood cx. PMT has been trying to set up [place] discussion and this was originally scheduled for this  1100 am but mother canceled at the last minute 2/2 to illness. Pt has had a fluctuating level of mentation and was unable to converse with me this am or this evening during FU. Also confusing picture is who can speak for pt. Pt mother and sister have been at the bedside most of the time but they do state he has married within the year. They would not share wife's name or number with me to include in [place] discussion. Called SNF but they have pt listed as single and mother, [name], as emergency contact.  Early in afternoon pt's wife came to the unit identified as [name]. She and pt spoke with Dr. Jonnie Finner. Per Dr. Jonnie Finner he informed both pt and wife that he was too sick to continue with HD. At that time pt was more alert and per Dr. Jonnie Finner both pt and pt's wife were tearful, and attempting to process information. Wife agreed to call pt's mother  To inform of stopping HD. During FU this evening, pt is now confused. He is diffiuclt for me to understand, speech very soft and sometimes garbled. I asked him what nephrology shared with him but I could only understand " need for more faith". I have not met with mother, sister or wife as of this note  Contacts/Participants in Discussion: Primary Decision Maker: Wife at this point in absence of HCPOA stating otherwise  Relationship to Patient wife HCPOA: no    Have asked SW or CM to assist in ascertaining marital status and HCPOA status  SUMMARY OF RECOMMENDATIONS Per nephrology, stopping dialysis 2/2 to declining clinical status Need to have [place] with wife and family optimally.   Code Status/Advance Care Planning: Full code    Code Status Orders        Start     Ordered   03/27/15 2324  Full code   Continuous     03/27/15 2323      Other Directives:None   Palliative Prophylaxis:   Aspiration, Bowel Regimen, Delirium Protocol, Frequent Pain Assessment, Palliative Wound Care and Turn Reposition  Additional Recommendations (Limitations, Scope, Preferences):  Full Comfort Care recommended but need to have family meeting to address that dialysis is being stopped , prognosis and expected changes, need to be DNR in setting of no further HD   Psycho-social/Spiritual:  Support System: [place] Desire for further Chaplaincy support:yes   Prognosis: < 2 weeks  Discharge Planning: Pt would qualify for hospice in-patient services   Chief Complaint/ Primary Diagnoses: Present on Admission:  . CAD (coronary artery disease) . Elevated troponin . HCAP (healthcare-associated pneumonia) . Chronic diastolic CHF (congestive heart failure) ([place]) . Acute respiratory failure with hypoxia ([place]) . HTN (hypertension) . 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 Acute encephalopathy . Abnormal liver function test  I have reviewed the medical record, interviewed the patient and family, and examined the patient. The following aspects are pertinent.  Past Medical History  Diagnosis Date  .  Anxiety   . Back pain   . GERD (gastroesophageal reflux disease)   . Peripheral neuropathy ([place])   . Arthritis   . Chronic diastolic CHF (congestive heart failure) ([place])     a. 04/2013 Echo: EF nl, no rwma, mild LVH.  Marland Kitchen Active smoker   . ESRD on hemodialysis ([place])     [name] 4 days per week on M-Tu-Wed and Fri.  Started HD in 1998 and has been on HD  initially at [facility], then went to [facility], then in [facility], then to [facility] and now is at [facility] for last 10 years.  Has L thigh AVG.     . Diabetes mellitus     diet controlled  . Hepatitis 2010    pt states hx of hep C 3 yrs ago  . PONV (postoperative nausea and vomiting)   . Hypertension   . Bell palsy   . Carpal tunnel syndrome     bilateral  . Palpitations   . Headache(784.0)     Hx: of Migraines  . CAD (coronary artery disease)     a. 04/2013 Ant STEMI/PCI: LM nl, LAD 95p (3.0x18 Xience DES), D1/2/3 small, LCX 80ost, RI 20p, RCA 175m CTO (unsuccessful PCI), EF 55%, mod basal inf HK.  . S/P coronary artery stent placement 05/21/2013    Xience Alpine Medtronic conditional 5 @ 1.5 and 3T   . Mucocele of maxillary sinus    Social History   Social History  . Marital Status: Single    Spouse Name: N/A  . Number of Children: N/A  . Years of Education: N/A   Social History Main Topics  . Smoking status: Current Every Day Smoker -- 0.20 packs/day for 40 years    Types: Cigarettes  . Smokeless tobacco: Never Used  . Alcohol Use: No  . Drug Use: No  . Sexual Activity: No   Other Topics Concern  . None   Social History Narrative   Family History  Problem Relation Age of Onset  . Diabetes Mother   . Stroke Mother   . Heart disease Mother   . [name] disease Father   . Hyperlipidemia Father    Scheduled Meds: . antiseptic oral rinse  7 mL Mouth Rinse BID  . aspirin  81 mg Oral Daily  . atorvastatin  80 mg Oral q1800  . B-complex with vitamin C  1 tablet Oral QHS  . calcium carbonate  3 tablet Oral TID WC  . cefTAZidime (FORTAZ)  IV  2 g Intravenous Q M,W,F-1800  . clopidogrel  75 mg Oral Daily  . darbepoetin (ARANESP) injection - DIALYSIS  60 mcg Intravenous Q Wed-HD  . dorzolamide-timolol  1 drop Both Eyes TID  . feeding supplement (PRO-STAT SUGAR FREE 64)  30 mL Oral BID  . ferric gluconate (FERRLECIT/NULECIT) IV  125 mg Intravenous Q M,W,F-HD  .  ipratropium-albuterol  3 mL Nebulization BID  . latanoprost  1 drop Both Eyes QHS  . multivitamin  1 tablet Oral QHS  . sulindac  200 mg Oral BID WC  . vancomycin  1,000 mg Intravenous Q M,W,F-HD   Continuous Infusions:  PRN Meds:.sodium chloride, sodium chloride, alteplase, colchicine, heparin, lidocaine (PF), lidocaine-prilocaine, ondansetron (ZOFRAN) IV, pentafluoroprop-tetrafluoroeth Medications Prior to Admission:  Prior to Admission medications   Medication Sig Start Date End Date Taking? Authorizing Provider  acetaminophen (TYLENOL) 325 MG tablet Take 650 mg by mouth every 6 (six) hours as needed for mild pain (low  back).   Yes Historical Provider, MD  Amino Acids-Protein Hydrolys (FEEDING SUPPLEMENT, PRO-STAT SUGAR FREE 64,) LIQD Take 30 mLs by mouth 2 (two) times daily.   Yes Historical Provider, MD  amitriptyline (ELAVIL) 25 MG tablet Take 25 mg by mouth at bedtime.   Yes Historical Provider, MD  aspirin 81 MG tablet Take 1 tablet (81 mg total) by mouth daily. 05/24/13  Yes Rhonda G Barrett, PA-C  calcium carbonate (TUMS - DOSED IN MG ELEMENTAL CALCIUM) 500 MG chewable tablet Chew 3 tablets by mouth 3 (three) times daily.   Yes Historical Provider, MD  clopidogrel (PLAVIX) 75 MG tablet Take 1 tablet (75 mg total) by mouth daily. 10/10/14  Yes Ashly M Gottschalk, DO  colchicine 0.6 MG tablet Take 0.6 mg by mouth every 12 (twelve) hours as needed (gout).    Yes Historical Provider, MD  dorzolamide-timolol (COSOPT) 22.3-6.8 MG/ML ophthalmic solution Place 1 drop into both eyes 3 (three) times daily.   Yes Historical Provider, MD  escitalopram (LEXAPRO) 10 MG tablet Take 10 mg by mouth daily.   Yes Historical Provider, MD  gabapentin (NEURONTIN) 100 MG capsule Take 100-200 mg by mouth 2 (two) times daily. Take 2 capsules ($RemoveBef'200mg'ECReabFlaR$ ) every morning, and 1 capsule ($RemoveBe'100mg'hAhhLacdY$ ) by mouth at bedtime 12/29/13  Yes Historical Provider, MD  Influenza Vac Typ A&B Surf Ant (FLUVIRIN) SUSP Inject 1 Syringe into  the muscle once.   Yes Historical Provider, MD  Menthol, Topical Analgesic, 4 % GEL Apply 1 application topically every 6 (six) hours as needed (pain). Apply to shoulders/lower back   Yes Historical Provider, MD  nitroGLYCERIN (NITROSTAT) 0.4 MG SL tablet Place 1 tablet (0.4 mg total) under the tongue every 5 (five) minutes as needed for chest pain. 05/24/13  Yes Rhonda G Barrett, PA-C  Nutritional Supplements (NUTRITIONAL SUPPLEMENT PO) Take 1 each by mouth 2 (two) times daily. Place in cup with lunch and dinner   Yes Historical Provider, MD  oxyCODONE (OXY IR/ROXICODONE) 5 MG immediate release tablet Take 5 mg by mouth daily as needed for severe pain.   Yes Historical Provider, MD  sulindac (CLINORIL) 200 MG tablet Take 200 mg by mouth 2 (two) times daily.   Yes Historical Provider, MD  traMADol (ULTRAM) 50 MG tablet Take 50 mg by mouth every 12 (twelve) hours as needed for severe pain.   Yes Historical Provider, MD  UNABLE TO FIND Take 60 mLs by mouth 3 (three) times daily. Med Name: MED PASS   Yes Historical Provider, MD  atorvastatin (LIPITOR) 80 MG tablet Take 1 tablet (80 mg total) by mouth daily at 6 PM. 05/24/13   Rhonda G Barrett, PA-C  B Complex-C (B-COMPLEX WITH VITAMIN C) tablet Take 1 tablet by mouth at bedtime.    Historical Provider, MD  fentaNYL (DURAGESIC - DOSED MCG/HR) 50 MCG/HR Apply one patch topically every 72 hours for pain. Remove old patch 10/08/14   Tiffany L Reed, DO  latanoprost (XALATAN) 0.005 % ophthalmic solution Place 1 drop into both eyes at bedtime.  04/20/14   Historical Provider, MD  multivitamin (RENA-VIT) TABS tablet Take 1 tablet by mouth at bedtime. 02/21/15   Orson Eva, MD   No Known Allergies  Review of Systems  Unable to perform ROS: Mental status change    Physical Exam  Constitutional: He appears well-developed.  Respiratory: Effort normal.  GI: He exhibits distension. There is tenderness.  Neurological:  Very difficult to understand speech. He  awakens to voice, seems confused tonight  Skin: Skin is warm and dry.  Psychiatric:  Thought processes disorganized    Vital Signs: BP 159/70 mmHg  Pulse 85  Temp(Src) 98.4 F (36.9 C) (Oral)  Resp 18  Ht $R'5\' 9"'FE$  (1.753 m)  Wt 92.2 kg (203 lb 4.2 oz)  BMI 30.00 kg/m2  SpO2 99%  SpO2: SpO2: 99 % O2 Device:SpO2: 99 % O2 Flow Rate: .O2 Flow Rate (L/min): 2 L/min  IO: Intake/output summary:  Intake/Output Summary (Last 24 hours) at 03/30/15 1833 Last data filed at 03/30/15 1832  Gross per 24 hour  Intake   1160 ml  Output      0 ml  Net   1160 ml    LBM: Last BM Date: 03/30/15 Baseline Weight: Weight: 102.967 kg (227 lb) Most recent weight: Weight: 92.2 kg (203 lb 4.2 oz)      Palliative Assessment/Data:  Flowsheet Rows        Most Recent Value   Intake Tab    Referral Department  Hospitalist   Unit at Time of Referral  Med/Surg Unit   Palliative Care Primary Diagnosis  Nephrology   Date Notified  03/28/15   Palliative Care Type  New Palliative care   Reason for referral  Clarify Goals of Care   Date of Admission  03/27/15   Date first seen by Palliative Care  03/30/15   # of days Palliative referral response time  2 Day(s)   # of days IP prior to Palliative referral  1   Clinical Assessment    Palliative Performance Scale Score  20%   Pain Max last 24 hours  Other (Comment)   Pain Min Last 24 hours  Other (Comment)   Dyspnea Max Last 24 Hours  Other (Comment)   Dyspnea Min Last 24 hours  Other (Comment)   Nausea Max Last 24 Hours  Other (Comment)   Nausea Min Last 24 Hours  Other (Comment)   Anxiety Max Last 24 Hours  Other (Comment)   Anxiety Min Last 24 Hours  Other (Comment)   Other Max Last 24 Hours  Other (Comment)   Psychosocial & Spiritual Assessment    Palliative Care Outcomes    Patient/Family meeting held?  No   Palliative Care follow-up planned  Yes, Facility      Additional Data Reviewed:  CBC:    Component Value Date/Time   WBC 16.0*  03/30/2015 0347   HGB 11.8* 03/30/2015 0347   HCT 35.6* 03/30/2015 0347   PLT 280 03/30/2015 0347   MCV 83.2 03/30/2015 0347   NEUTROABS 13.7* 03/27/2015 1356   LYMPHSABS 0.4* 03/27/2015 1356   MONOABS 1.0 03/27/2015 1356   EOSABS 0.0 03/27/2015 1356   BASOSABS 0.0 03/27/2015 1356   Comprehensive Metabolic Panel:    Component Value Date/Time   NA 134* 03/30/2015 0347   K 3.6 03/30/2015 0347   CL 96* 03/30/2015 0347   CO2 26 03/30/2015 0347   BUN 24* 03/30/2015 0347   CREATININE 3.13* 03/30/2015 0347   CREATININE 5.62* 09/26/2014 1508   GLUCOSE 98 03/30/2015 0347   CALCIUM 8.3* 03/30/2015 0347   AST 52* 03/30/2015 0347   ALT 17 03/30/2015 0347   ALKPHOS 174* 03/30/2015 0347   BILITOT 2.2* 03/30/2015 0347   PROT 8.1 03/30/2015 0347   ALBUMIN 2.4* 03/30/2015 0347     Time In: 1100; 1800 Time Out: 1130; 1845 Time Total: 75 min Greater than 50%  of this time was spent counseling and coordinating care related to the above  assessment and plan. Will FU with spouse and other family for [place] discussion to review medical changes today in setting of no further dialysis and recommendation to switch to comfort care Met with Dr. Cruzita Lederer and Dr. Jonnie Finner  Signed by: [name] Horn, NP  [name] Horn, NP  03/30/2015, 6:33 PM  Please contact Palliative Medicine Team phone at [phone] for questions and concerns.   Addendum: LM with [name], pt's wife. Hopeful to discuss further her husband's prognosis and options at this point with dialysis being stopped. I also called [name] and [name]. Spoke with pt's mother. Pt's sister [name] was not at home. Mother stated that pt's wife did call her today to ask for $ 200, but did not share any clinical information. I am not clear that she knows about dialysis being stopped. Will speak with [name] at 0900 on 12/4 and attempt face to face mtg, or at least review discussion pt and pt's wife had with Dr. Jonnie Finner, prognosis and  other relevant issues. Pt's mother sounded so frail on the phone I elected to wait for daughter to be present

## 2015-03-30 NOTE — Progress Notes (Signed)
Family canceled mtg 2/2 to illness. Pt's mother would not give me spouse's number, nor would sister give RN pt's spouses's number. Per my conversation with pt's mother 03/29/15 siblings and herself do not have HCPOA. SNF does not know about spouse and has him listed as single and pt's mother as emergency contact. Pt spouse called unit in the interim but would not leave number and hung up . Will place orders to care mgt and social work to help with ascertaining who has the legal right to address health care issues. Pt at this point cannot speak for himself. Eduard Roux, ANP

## 2015-03-30 NOTE — Progress Notes (Signed)
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   HCAP (healthcare-associated pneumonia)   Chronic diastolic CHF (congestive heart failure) (HCC)   Diabetes (HCC)   Acute encephalopathy   Palliative care encounter   Pressure ulcer   Acute respiratory failure with hypoxia - likely related to HCAP, missed HD - oxygen support as needed - improving, now on room air  Healthcare associated pneumonia - continue broad spectrum antibiotics with Vancomycin and Elita Quick - Speech therapy for swallow evaluated 12/1, now on remal diet - Micro pending, 1/2 bottles GNR, now speciated to E coli  GNR bacteremia 1/2  - only one bottle, E coli, sensitivities pending - ?source urine vs other, patient had elevated bilirubin but RUQ US unremarkable, no abdominal pain, no nausea/vomiting so less likely intra-abdominal source.   Elevated troponin - troponins chronically elevated, flat trend.  - Denies chest pain. 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 Likely related to above. CT of the head without acute changes - Antibiotics, dialysis - improving slowly - unknown baseline  ESRD  - Only 20 minutes of dialysis 11/30 - nephrology following, HD per them  Abnormal LFTs. History of same. -  Alkaline phosphatase and total bilirubin elevated from 1 month ago - has known liver disease due to hepatitis C, reported cirrhosis however I don't see imaging characteristic of cirrhosis. [place] with multiple gallstones, no acute cholecystitis, CBD @ 3.6 mm - bilirubin improving this morning, now @ 2.2  Chronic diastolic heart failure - Recent echo with an EF of 55%, repeat echo since some ectopy on monitor - Monitor intake and output - Daily weights 208 >> 203  Chronic pain. History of back pain - Home medications include gabapentin and fentanyl patch.  - hold fentanyl patch due to AMS - appreciate palliative consult also for symptom control  Diabetes type 2. Controlled - Diet controlled - Recent A1c 5.6 - Monitor CBG G's and use sliding scale insulin as indicated - fasting CBG this morning at 98  Anemia, normocytic. Related to chronic illness - no Signs symptoms of active bleeding - Monitor  Sinus mucocele. Left per CT.  - stable - ENT consult obtained at last hospitalization. They opined likely related to chronic sinusitis recommend outpatient workup  Goals of care - have asked palliative help. He has a series of chronic medical problems, recurrent pneumonia and difficulties with dialysis - GOC discussions in    Diet: Diet renal with fluid restriction Fluid restriction:: 1200 mL Fluid; Room service appropriate?: Yes; Fluid consistency:: Thin Fluids: none  DVT Prophylaxis: SCD  Code Status: Full Code Family Communication: no family bedside Disposition Plan: inpatient  Barriers to discharge: MMP  Consultants:  Nephrology   Palliative  Procedures:  None    Antibiotics Vancomycin 11/30 >> Elita Quick 11/30 >>   Studies  US Abdomen Limited Ruq  03/28/2015  CLINICAL DATA:  Elevated liver enzymes. EXAM: US ABDOMEN LIMITED - RIGHT UPPER QUADRANT COMPARISON:  CT of the abdomen and pelvis 08/12/2014 FINDINGS: Gallbladder: Multiple gallstones are present, largest  measuring approximately 7 mm. Gallbladder wall is 1.8 mm in thickness. No sonographic Murphy sign or pericholecystic fluid. Common bile duct: Diameter: 3.6 mm Liver: Normal echotexture.  No focal mass. Additional: Right pleural effusion is noted. IMPRESSION: 1. Multiple gallstones without other evidence for acute cholecystitis. 2. Right pleural effusion. Electronically Signed   By: Norva Pavlov M.D.   On: 03/28/2015 19:31   Objective  Filed Vitals:   03/29/15 2336 03/30/15 0458 03/30/15 0734 03/30/15 0800  BP: 104/76 160/86  157/85  Pulse: 79 79 80 81  Temp: 98.6 F (37 C) 97.9 F (36.6 C)  97.6 F (36.4 C)  TempSrc: Oral Oral  Oral  Resp: Height:      Weight:      SpO2: 97% 99% 97% 97%    Intake/Output Summary (Last 24 hours) at 03/30/15 1049 Last data filed at 03/30/15 0930  Gross per 24 hour  Intake    720 ml  Output   2000 ml  Net  -1280 ml   Filed Weights   03/27/15 1356 03/29/15 1041 03/29/15 1453  Weight: 102.967 kg (227 lb) 94.8 kg (208 lb 15.9 oz) 92.2 kg (203 lb 4.2 oz)   Exam:  GENERAL: NAD, confused  HEENT: + scleral icterus, PERRL  NECK: supple, no LAD  LUNGS: CTA biL, no wheezing  HEART: RRR without MRG  ABDOMEN: soft, non tender  EXTREMITIES: no clubbing / cyanosis  NEUROLOGIC: non focal   Data Reviewed: Basic Metabolic Panel:  Recent Labs Lab 03/27/15 1356 03/28/15 2132 03/29/15 1045 03/30/15 0347  NA 137 137 137 134*  K 4.5 3.6 3.2* 3.6  CL 95* 97* 97* 96*  CO2 32 GLUCOSE 104* 91 102* 98  BUN 29* 32* 46* 24*  CREATININE 4.86* 3.77* 4.55* 3.13*  CALCIUM 8.7* 8.5* 8.5* 8.3*  MG  --   --  2.0  --   PHOS  --  1.9*  --   --    Liver Function Tests:  Recent Labs Lab 03/27/15 1356 03/28/15 2132 03/29/15 1045 03/30/15 0347  AST 93*  --  54* 52*  ALT 26  --  17 17  ALKPHOS 260*  --  185* 174*  BILITOT 7.8*  --  2.3* 2.2*  PROT 7.4  --  7.6 8.1  ALBUMIN 2.3* 2.4* 2.3* 2.4*    Recent Labs Lab  03/28/15 0033  AMMONIA 34   CBC:  Recent Labs Lab 03/27/15 1356 03/28/15 2132 03/29/15 1045 03/30/15 0347  WBC 15.0* 11.2* 13.6* 16.0*  NEUTROABS 13.7*  --   --   --   HGB 10.3* 11.8* 11.0* 11.8*  HCT 31.5* 36.0* 33.0* 35.6*  MCV 84.9 82.6 82.9 83.2  PLT 212 230 266 280   Cardiac Enzymes:  Recent Labs Lab 03/28/15 0033 03/28/15 0238  TROPONINI 0.61* 0.62*   Recent Results (from the past 240 hour(s))  Culture, blood (routine x 2)     Status: None (Preliminary result)   Collection Time: 03/27/15  3:50 PM  Result Value Ref Range Status   Specimen Description BLOOD RIGHT FOREARM  Final   Special Requests BOTTLES DRAWN AEROBIC AND ANAEROBIC 5CC  Final   Culture NO GROWTH 2 DAYS  Final   Report Status  PENDING  Incomplete  Culture, blood (routine x 2)     Status: None (Preliminary result)   Collection Time: 03/27/15  3:50 PM  Result Value Ref Range Status   Specimen Description BLOOD RIGHT HAND  Final   Special Requests BOTTLES DRAWN AEROBIC AND ANAEROBIC 5CC  Final   Culture  Setup Time   Final    GRAM NEGATIVE RODS AEROBIC BOTTLE ONLY CRITICAL RESULT CALLED TO, READ BACK BY AND VERIFIED WITH: S WOODARD @0552  03/29/15 MKELLY    Culture ESCHERICHIA COLI  Final   Report Status PENDING  Incomplete  MRSA PCR Screening     Status: None   Collection Time: 03/27/15 11:25 PM  Result Value Ref Range Status   MRSA by PCR NEGATIVE NEGATIVE Final    Comment:        The GeneXpert MRSA Assay (FDA approved for NASAL specimens only), is one component of a comprehensive MRSA colonization surveillance program. It is not intended to diagnose MRSA infection nor to guide or monitor treatment for MRSA infections.   Culture, blood (routine x 2) Call MD if unable to obtain prior to antibiotics being given     Status: None (Preliminary result)   Collection Time: 03/28/15 12:20 AM  Result Value Ref Range Status   Specimen Description BLOOD LEFT ARM  Final   Special Requests IN  PEDIATRIC BOTTLE 3CC  Final   Culture NO GROWTH 1 DAY  Final   Report Status PENDING  Incomplete  Culture, blood (routine x 2) Call MD if unable to obtain prior to antibiotics being given     Status: None (Preliminary result)   Collection Time: 03/28/15 12:33 AM  Result Value Ref Range Status   Specimen Description BLOOD RIGHT HAND  Final   Special Requests IN PEDIATRIC BOTTLE 2CC  Final   Culture NO GROWTH 1 DAY  Final   Report Status PENDING  Incomplete   Scheduled Meds: . antiseptic oral rinse  7 mL Mouth Rinse BID  . aspirin  81 mg Oral Daily  . atorvastatin  80 mg Oral q1800  . B-complex with vitamin C  1 tablet Oral QHS  . calcium carbonate  3 tablet Oral TID WC  . cefTAZidime (FORTAZ)  IV  2 g Intravenous Q M,W,F-1800  . clopidogrel  75 mg Oral Daily  . darbepoetin (ARANESP) injection - DIALYSIS  60 mcg Intravenous Q Wed-HD  . dorzolamide-timolol  1 drop Both Eyes TID  . feeding supplement (PRO-STAT SUGAR FREE 64)  30 mL Oral BID  . ferric gluconate (FERRLECIT/NULECIT) IV  125 mg Intravenous Q M,W,F-HD  . ipratropium-albuterol  3 mL Nebulization BID  . latanoprost  1 drop Both Eyes QHS  . multivitamin  1 tablet Oral QHS  . sulindac  200 mg Oral BID WC  . vancomycin  1,000 mg Intravenous Q M,W,F-HD   Continuous Infusions:   Pamella Pert, MD [facility] Pager [phone]. If 7 PM - 7 AM, please contact night-coverage at [email or web address], password [facility] 03/30/2015, 10:49 AM  LOS: 3 days

## 2015-03-30 NOTE — Care Management Important Message (Signed)
Important Message  Patient Details  Name: [name] MRN: [id number] Date of Birth: [birth year]   Medicare Important Message Given:  Yes    Kyla Balzarine 03/30/2015, 1:09 PM

## 2015-03-30 NOTE — Progress Notes (Signed)
Spoke with pt's family.  He has been in SNF's for over a year now, 6 mos in [place] then the last 6 mos in a SNF towards  to be closer to mother/ sister.  He has been hospitalized more and more frequently w FTT and progressive debility. He now is having signs of liver failure likely from known hep C.  Patient's prognosis extremely poor, poor candidate for continued dialysis.  Family is supportive.  Explained to patient.  Plan is for palliative care to help with transition to comfort care.  Have d/w primary MD and pall care.    Vinson Moselle MD [employer] pager [phone]    cell [phone] 03/30/2015, 2:11 PM

## 2015-03-30 NOTE — Progress Notes (Signed)
Patient arrived on unit via bed, wife at bedside.  Telemetry placed per MD order & CMT notified.

## 2015-03-30 NOTE — Progress Notes (Signed)
Received a page from the nurse letting me know that [name] has only a short time to live and would come up and minister to the family. I responded and listened to [name] and his significant other and prayed with both.

## 2015-03-30 NOTE — Progress Notes (Signed)
Patient being transferred to 6E. Report called to receiving nurse.

## 2015-03-31 DIAGNOSIS — Z7189 Other specified counseling: Secondary | ICD-10-CM

## 2015-03-31 DIAGNOSIS — N186 End stage renal disease: Secondary | ICD-10-CM | POA: Insufficient documentation

## 2015-03-31 DIAGNOSIS — Z992 Dependence on renal dialysis: Secondary | ICD-10-CM

## 2015-03-31 LAB — CBC
HCT: 39.4 % (ref 39.0–52.0)
HEMOGLOBIN: 13.2 g/dL (ref 13.0–17.0)
MCH: 27.7 pg (ref 26.0–34.0)
MCHC: 33.5 g/dL (ref 30.0–36.0)
MCV: 82.8 fL (ref 78.0–100.0)
PLATELETS: 286 10*3/uL (ref 150–400)
RBC: 4.76 MIL/uL (ref 4.22–5.81)
RDW: 18.6 % — ABNORMAL HIGH (ref 11.5–15.5)
WBC: 27.1 10*3/uL — AB (ref 4.0–10.5)

## 2015-03-31 LAB — BASIC METABOLIC PANEL
ANION GAP: 12 (ref 5–15)
BUN: 40 mg/dL — ABNORMAL HIGH (ref 6–20)
CHLORIDE: 93 mmol/L — AB (ref 101–111)
CO2: 30 mmol/L (ref 22–32)
CREATININE: 4.41 mg/dL — AB (ref 0.61–1.24)
Calcium: 8.9 mg/dL (ref 8.9–10.3)
GFR calc non Af Amer: 13 mL/min — ABNORMAL LOW (ref >60)
GFR, EST AFRICAN AMERICAN: 16 mL/min — AB (ref >60)
Glucose, Bld: 97 mg/dL (ref 65–99)
Potassium: 3.5 mmol/L (ref 3.5–5.1)
SODIUM: 135 mmol/L (ref 135–145)

## 2015-03-31 MED ORDER — HYDROMORPHONE HCL 1 MG/ML IJ SOLN
0.2500 mg | INTRAMUSCULAR | Status: DC | PRN
  Administered 2015-04-01 – 2015-04-02 (×5): 0.5 mg via INTRAVENOUS
  Filled 2015-03-31 (×5): qty 1

## 2015-03-31 MED ORDER — LORAZEPAM 2 MG/ML IJ SOLN
0.5000 mg | INTRAMUSCULAR | Status: DC | PRN
  Administered 2015-03-31: 0.5 mg via INTRAVENOUS
  Filled 2015-03-31: qty 1

## 2015-03-31 MED ORDER — IMIPENEM-CILASTATIN 250 MG IV SOLR
250.0000 mg | Freq: Two times a day (BID) | INTRAVENOUS | Status: DC
  Administered 2015-03-31 – 2015-04-02 (×5): 250 mg via INTRAVENOUS
  Filled 2015-03-31 (×6): qty 250

## 2015-03-31 NOTE — Progress Notes (Signed)
PROGRESS NOTE  [name] UEA:[id number] DOB: 25-May-1955 DOA: 03/27/2015 PCP: Dyke Maes, MD  HPI: 59 yo M with diastolic CHF, ESRD, DM, HTN, CAD, admitted with AMS, hypotension shortly after HD  Subjective / 24 H Interval events - confused this morning, but seems to accept EOL. 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)   Goals of care - discussed with pt, [name] and [name] Roux, [name]. He is not a HD candidate, transition care towards comfort. - will likely need residential hospice.   Acute respiratory failure with hypoxia - likely related to HCAP, missed HD - oxygen support as needed, DNI - improving, now on room air  Healthcare associated pneumonia - on Imipenem, continue Anx until d/c  GNR bacteremia 1/2 >> E coli - resistant to 3rd generation cephalosporins, switch to Imipenem - ?source urine vs other, patient had elevated bilirubin but RUQ US unremarkable, no abdominal pain, no nausea/vomiting so less likely intra-abdominal source.   Elevated troponin - troponins chronically elevated, flat trend.  - Denies chest pain. 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 Likely related to above. CT of the head without acute changes - Antibiotics, dialysis - improving slowly - unknown baseline  ESRD  - Only 20 minutes of dialysis  11/30 - nephrology following - per [name], poor dialysis candidate, stop HD  Abnormal LFTs. History of same. - Alkaline phosphatase and total bilirubin elevated from 1 month ago - has known liver disease due to hepatitis C, reported cirrhosis however I don't see imaging characteristic of cirrhosis. [place] with multiple gallstones, no acute cholecystitis, CBD @ 3.6 mm - bilirubin improving  Chronic diastolic heart failure - Recent echo with an EF of 55%, repeat echo since some ectopy on monitor - Monitor intake and output  Chronic pain. History of back pain - Home medications include gabapentin and fentanyl patch.  - hold fentanyl patch due to AMS - appreciate palliative consult also for symptom control  Diabetes type 2. Controlled - Diet controlled - Recent A1c 5.6  Anemia, normocytic. Related to chronic illness - no Signs symptoms of active bleeding  Sinus mucocele. Left per CT.  - stable - ENT consult obtained at last hospitalization. They opined likely related to chronic sinusitis recommend outpatient workup   Diet: Diet renal with fluid restriction Fluid restriction:: 1200 mL Fluid; Room service appropriate?: Yes; Fluid consistency:: Thin Fluids: none  DVT Prophylaxis: SCD  Code Status: DNR Family Communication: no family bedside Disposition Plan: inpatient  Barriers to discharge: MMP  Consultants:  Nephrology   Palliative  Procedures:  None    Antibiotics Vancomycin 11/30 >> Elita Quick 11/30 >>   Studies  No results found. Objective  Filed Vitals:   03/30/15 2222 03/31/15 0409 03/31/15 0716 03/31/15 0817  BP:  151/85  173/88  Pulse:  79  85  Temp:  98 F (  36.7 C)  98.2 F (36.8 C)  TempSrc:    Oral  Resp:  18  18  Height:      Weight: 75 kg (165 lb 5.5 oz)     SpO2:  100% 95% 98%    Intake/Output Summary (Last 24 hours) at 03/31/15 1410 Last data filed at 03/31/15 4315  Gross per 24 hour  Intake    680 ml  Output      0 ml  Net    680  ml   Filed Weights   03/29/15 1041 03/29/15 1453 03/30/15 2222  Weight: 94.8 kg (208 lb 15.9 oz) 92.2 kg (203 lb 4.2 oz) 75 kg (165 lb 5.5 oz)   Exam:  GENERAL: NAD, confused  HEENT: + scleral icterus, PERRL  NECK: supple, no LAD  LUNGS: CTA biL, no wheezing  HEART: RRR without MRG  ABDOMEN: soft, non tender  EXTREMITIES: no clubbing / cyanosis  NEUROLOGIC: non focal   Data Reviewed: Basic Metabolic Panel:  Recent Labs Lab 03/27/15 1356 03/28/15 2132 03/29/15 1045 03/30/15 0347 03/31/15 0551  NA 137 137 137 134* 135  K 4.5 3.6 3.2* 3.6 3.5  CL 95* 97* 97* 96* 93*  CO2 32 29 30 26 30   GLUCOSE 104* 91 102* 98 97  BUN 29* 32* 46* 24* 40*  CREATININE 4.86* 3.77* 4.55* 3.13* 4.41*  CALCIUM 8.7* 8.5* 8.5* 8.3* 8.9  MG  --   --  2.0  --   --   PHOS  --  1.9*  --   --   --    Liver Function Tests:  Recent Labs Lab 03/27/15 1356 03/28/15 2132 03/29/15 1045 03/30/15 0347  AST 93*  --  54* 52*  ALT 26  --  17 17  ALKPHOS 260*  --  185* 174*  BILITOT 7.8*  --  2.3* 2.2*  PROT 7.4  --  7.6 8.1  ALBUMIN 2.3* 2.4* 2.3* 2.4*    Recent Labs Lab 03/28/15 0033  AMMONIA 34   CBC:  Recent Labs Lab 03/27/15 1356 03/28/15 2132 03/29/15 1045 03/30/15 0347 03/31/15 0551  WBC 15.0* 11.2* 13.6* 16.0* 27.1*  NEUTROABS 13.7*  --   --   --   --   HGB 10.3* 11.8* 11.0* 11.8* 13.2  HCT 31.5* 36.0* 33.0* 35.6* 39.4  MCV 84.9 82.6 82.9 83.2 82.8  PLT 212 230 266 280 286   Cardiac Enzymes:  Recent Labs Lab 03/28/15 0033 03/28/15 0238  TROPONINI 0.61* 0.62*   Recent Results (from the past 240 hour(s))  Culture, blood (routine x 2)     Status: None (Preliminary result)   Collection Time: 03/27/15  3:50 PM  Result Value Ref Range Status   Specimen Description BLOOD RIGHT FOREARM  Final   Special Requests BOTTLES DRAWN AEROBIC AND ANAEROBIC 5CC  Final   Culture NO GROWTH 3 DAYS  Final   Report Status PENDING  Incomplete  Culture, blood (routine x 2)      Status: None   Collection Time: 03/27/15  3:50 PM  Result Value Ref Range Status   Specimen Description BLOOD RIGHT HAND  Final   Special Requests BOTTLES DRAWN AEROBIC AND ANAEROBIC 5CC  Final   Culture  Setup Time   Final    GRAM NEGATIVE RODS AEROBIC BOTTLE ONLY CRITICAL RESULT CALLED TO, READ BACK BY AND VERIFIED WITH: S WOODARD @0552  03/29/15 MKELLY    Culture ESCHERICHIA COLI  Final   Report Status 03/31/2015 FINAL  Final  Organism ID, Bacteria ESCHERICHIA COLI  Final      Susceptibility   Escherichia coli - MIC*    AMPICILLIN >=32 RESISTANT Resistant     CEFAZOLIN >=64 RESISTANT Resistant     CEFEPIME <=1 SENSITIVE Sensitive     CEFTAZIDIME >=64 RESISTANT Resistant     CEFTRIAXONE 16 INTERMEDIATE Intermediate     CIPROFLOXACIN >=4 RESISTANT Resistant     GENTAMICIN >=16 RESISTANT Resistant     IMIPENEM 0.5 SENSITIVE Sensitive     TRIMETH/SULFA <=20 SENSITIVE Sensitive     AMPICILLIN/SULBACTAM >=32 RESISTANT Resistant     PIP/TAZO 64 INTERMEDIATE Intermediate     * ESCHERICHIA COLI  MRSA PCR Screening     Status: None   Collection Time: 03/27/15 11:25 PM  Result Value Ref Range Status   MRSA by PCR NEGATIVE NEGATIVE Final    Comment:        The GeneXpert MRSA Assay (FDA approved for NASAL specimens only), is one component of a comprehensive MRSA colonization surveillance program. It is not intended to diagnose MRSA infection nor to guide or monitor treatment for MRSA infections.   Culture, blood (routine x 2) Call MD if unable to obtain prior to antibiotics being given     Status: None (Preliminary result)   Collection Time: 03/28/15 12:20 AM  Result Value Ref Range Status   Specimen Description BLOOD LEFT ARM  Final   Special Requests IN PEDIATRIC BOTTLE 3CC  Final   Culture NO GROWTH 2 DAYS  Final   Report Status PENDING  Incomplete  Culture, blood (routine x 2) Call MD if unable to obtain prior to antibiotics being given     Status: None (Preliminary result)    Collection Time: 03/28/15 12:33 AM  Result Value Ref Range Status   Specimen Description BLOOD RIGHT HAND  Final   Special Requests IN PEDIATRIC BOTTLE 2CC  Final   Culture NO GROWTH 2 DAYS  Final   Report Status PENDING  Incomplete   Scheduled Meds: . antiseptic oral rinse  7 mL Mouth Rinse BID  . aspirin  81 mg Oral Daily  . calcium carbonate  3 tablet Oral TID WC  . clopidogrel  75 mg Oral Daily  . dorzolamide-timolol  1 drop Both Eyes TID  . feeding supplement (PRO-STAT SUGAR FREE 64)  30 mL Oral BID  . imipenem-cilastatin  250 mg Intravenous Q12H  . ipratropium-albuterol  3 mL Nebulization BID  . latanoprost  1 drop Both Eyes QHS  . sulindac  200 mg Oral BID WC   Continuous Infusions:   Time spent: 25 minutes, more than 50% discussing with pt, nephrology, palliative at bedside  [name] Pert, MD [facility] Pager [phone]. If 7 PM - 7 AM, please contact night-coverage at [email or web address], password [facility] 03/31/2015, 2:10 PM  LOS: 4 days

## 2015-03-31 NOTE — Progress Notes (Signed)
[facility] Progress Note   Subjective: no c/o  Filed Vitals:   03/30/15 2222 03/31/15 0409 03/31/15 0716 03/31/15 0817  BP:  151/85  173/88  Pulse:  79  85  Temp:  98 F (36.7 C)  98.2 F (36.8 C)  TempSrc:    Oral  Resp:  18  18  Height:      Weight: 75 kg (165 lb 5.5 oz)     SpO2:  100% 95% 98%    Inpatient medications: . antiseptic oral rinse  7 mL Mouth Rinse BID  . aspirin  81 mg Oral Daily  . atorvastatin  80 mg Oral q1800  . B-complex with vitamin C  1 tablet Oral QHS  . calcium carbonate  3 tablet Oral TID WC  . cefTAZidime (FORTAZ)  IV  2 g Intravenous Q M,W,F-1800  . clopidogrel  75 mg Oral Daily  . darbepoetin (ARANESP) injection - DIALYSIS  60 mcg Intravenous Q Wed-HD  . dorzolamide-timolol  1 drop Both Eyes TID  . feeding supplement (PRO-STAT SUGAR FREE 64)  30 mL Oral BID  . ferric gluconate (FERRLECIT/NULECIT) IV  125 mg Intravenous Q M,W,F-HD  . ipratropium-albuterol  3 mL Nebulization BID  . latanoprost  1 drop Both Eyes QHS  . multivitamin  1 tablet Oral QHS  . sulindac  200 mg Oral BID WC     sodium chloride, sodium chloride, alteplase, colchicine, heparin, lidocaine (PF), lidocaine-prilocaine, ondansetron (ZOFRAN) IV, pentafluoroprop-tetrafluoroeth  Exam: Awake, tired No jvd Chest clear bilat RRR no RG Abd obese ntnd +bs soft GU normal Bilat LE pretib edema 1+ , no leg wounds or ulcers  L fem AVG +bruit Neuro nonfocal, myoclonic jerking resolved  MWF [place] 4.5h 98kg 2/2.25 bath Heparin none L fem AVG Hect 2 ug Mircera 50 q2wk, due today Venofer 100/ wk  BP's at OP HD normal 120-140 systolic   Assessment: 1 AMS infection/ narc excess , wbc up today 2 Jaundice / hx hep C - new bili^^ 7.9 3 Hypotension resolved 4 Morbid obesity 5 ESRD  6 Vol - 6kg under dry 7 CAD hx stents 8 Chron back pain - duragesic patch on hold 10 Debility/FTT - have d/w pt's available family (wife, yesterday) regarding hospice care and  she was in favor (poor QOL w progressive decline, etc).  11 Code status - DNR written  Plan - no further dialysis. Palliative care is assisting in transition to hospice care. Will sign off.    Vinson Moselle MD [facility] pager [phone]    cell [phone] 03/31/2015, 11:16 AM    Recent Labs Lab 03/28/15 2132 03/29/15 1045 03/30/15 0347 03/31/15 0551  NA 137 137 134* 135  K 3.6 3.2* 3.6 3.5  CL 97* 97* 96* 93*  CO2 GLUCOSE 91 102* 98 97  BUN 32* 46* 24* 40*  CREATININE 3.77* 4.55* 3.13* 4.41*  CALCIUM 8.5* 8.5* 8.3* 8.9  PHOS 1.9*  --   --   --     Recent Labs Lab 03/27/15 1356 03/28/15 2132 03/29/15 1045 03/30/15 0347  AST 93*  --  54* 52*  ALT 26  --  17 17  ALKPHOS 260*  --  185* 174*  BILITOT 7.8*  --  2.3* 2.2*  PROT 7.4  --  7.6 8.1  ALBUMIN 2.3* 2.4* 2.3* 2.4*    Recent Labs Lab 03/27/15 1356  03/29/15 1045 03/30/15 0347 03/31/15 0551  WBC 15.0*  < > 13.6* 16.0*  27.1*  NEUTROABS 13.7*  --   --   --   --   HGB 10.3*  < > 11.0* 11.8* 13.2  HCT 31.5*  < > 33.0* 35.6* 39.4  MCV 84.9  < > 82.9 83.2 82.8  PLT 212  < > 266 280 286  < > = values in this interval not displayed.

## 2015-03-31 NOTE — Progress Notes (Signed)
ANTIBIOTIC CONSULT NOTE - FOLLOW UP  Pharmacy Consult for Imipenem Indication: bacteremia  No Known Allergies  Patient Measurements: Height:  (175.3 cm) Weight: 165 lb 5.5 oz (75 kg) IBW/kg (Calculated) : 70.7  Vital Signs: Temp: 98.2 F (36.8 C) (12/04 0817) Temp Source: Oral (12/04 0817) BP: 173/88 mmHg (12/04 0817) Pulse Rate: 85 (12/04 0817) Intake/Output from previous day: 12/03 0701 - 12/04 0700 In: 800 [P.O.:800] Out: 0  Intake/Output from this shift: Total I/O In: 240 [P.O.:240] Out: -   Labs:  Recent Labs  03/29/15 1045 03/30/15 0347 03/31/15 0551  WBC 13.6* 16.0* 27.1*  HGB 11.0* 11.8* 13.2  PLT 266 280 286  CREATININE 4.55* 3.13* 4.41*   Estimated Creatinine Clearance: 18 mL/min (by C-G formula based on Cr of 4.41).  Microbiology: Recent Results (from the past 720 hour(s))  Culture, blood (routine x 2)     Status: None (Preliminary result)   Collection Time: 03/27/15  3:50 PM  Result Value Ref Range Status   Specimen Description BLOOD RIGHT FOREARM  Final   Special Requests BOTTLES DRAWN AEROBIC AND ANAEROBIC 5CC  Final   Culture NO GROWTH 3 DAYS  Final   Report Status PENDING  Incomplete  Culture, blood (routine x 2)     Status: None   Collection Time: 03/27/15  3:50 PM  Result Value Ref Range Status   Specimen Description BLOOD RIGHT HAND  Final   Special Requests BOTTLES DRAWN AEROBIC AND ANAEROBIC 5CC  Final   Culture  Setup Time   Final    GRAM NEGATIVE RODS AEROBIC BOTTLE ONLY CRITICAL RESULT CALLED TO, READ BACK BY AND VERIFIED WITH: S WOODARD  03/29/15 MKELLY    Culture ESCHERICHIA COLI  Final   Report Status 03/31/2015 FINAL  Final   Organism ID, Bacteria ESCHERICHIA COLI  Final      Susceptibility   Escherichia coli - MIC*    AMPICILLIN >=32 RESISTANT Resistant     CEFAZOLIN >=64 RESISTANT Resistant     CEFEPIME <=1 SENSITIVE Sensitive     CEFTAZIDIME >=64 RESISTANT Resistant     CEFTRIAXONE 16 INTERMEDIATE  Intermediate     CIPROFLOXACIN >=4 RESISTANT Resistant     GENTAMICIN >=16 RESISTANT Resistant     IMIPENEM 0.5 SENSITIVE Sensitive     TRIMETH/SULFA <=20 SENSITIVE Sensitive     AMPICILLIN/SULBACTAM >=32 RESISTANT Resistant     PIP/TAZO 64 INTERMEDIATE Intermediate     * ESCHERICHIA COLI  MRSA PCR Screening     Status: None   Collection Time: 03/27/15 11:25 PM  Result Value Ref Range Status   MRSA by PCR NEGATIVE NEGATIVE Final    Comment:        The GeneXpert MRSA Assay (FDA approved for NASAL specimens only), is one component of a comprehensive MRSA colonization surveillance program. It is not intended to diagnose MRSA infection nor to guide or monitor treatment for MRSA infections.   Culture, blood (routine x 2) Call MD if unable to obtain prior to antibiotics being given     Status: None (Preliminary result)   Collection Time: 03/28/15 12:20 AM  Result Value Ref Range Status   Specimen Description BLOOD LEFT ARM  Final   Special Requests IN PEDIATRIC BOTTLE 3CC  Final   Culture NO GROWTH 2 DAYS  Final   Report Status PENDING  Incomplete  Culture, blood (routine x 2) Call MD if unable to obtain prior to antibiotics being given     Status: None (Preliminary result)  Collection Time: 03/28/15 12:33 AM  Result Value Ref Range Status   Specimen Description BLOOD RIGHT HAND  Final   Special Requests IN PEDIATRIC BOTTLE 2CC  Final   Culture NO GROWTH 2 DAYS  Final   Report Status PENDING  Incomplete   Assessment:   Day # 4 Vanc and Ceftazidime.   Afebrile, WBC up to 27.1.     E coli in blood - resistant to Ceftazidime.  Only sensitive to Cefepime, Imipenem and Septra.   Discussed briefly with Dr. Elvera Lennox.       BUN 40, Scr 4.41.  Currently planning no further dialysis, to continue antibiotics.    Goal of Therapy:  appropriate Imipenem dose for renal function  Plan:   Primaxin 250 mg IV q12hrs.  Will follow renal function off HD.  May need dose adjustment or antibiotic  change as renal function declines.  Dennie Fetters, [place] Pager: [phone] 03/31/2015,12:28 PM

## 2015-03-31 NOTE — Progress Notes (Signed)
Daily Progress Note   Patient Name: [name]       Date: 03/31/2015 DOB: 08-Jul-1955  Age: 58 y.o. MRN#: [id number] Attending Physician: Leatha Gilding, MD Primary Care Physician: Dyke Maes, MD Admit Date: 03/27/2015  Reason for Consultation/Follow-up: Establishing goals of care, Hospice Evaluation, Non pain symptom management, Pain control, Psychosocial/spiritual support, Terminal Care and Withdrawal of life-sustaining treatment  Subjective: Spoke with Dr. Elvera Lennox as well as Dr. Arlean Hopping. Saw pt together to go over again his current clinical status regarding stopping dialysis. Pt does seem to grasp that he can no longer have dialysis. Explained to him that he is too sick to continue. He asks me to assemble his family so he can say good bye. Wishes he had more time with his wife. I was able to reach sister on the phone this am. I told her that due to worsening clinical status he was having to stop dialysis. She told me she was not surprised to hear this. That she had been going with him to his dialysis treatments and he had shared with her he didn't know how much longer he could go through treatment. She is going to tell his mother. Family and spouse coming to hospital today. Did verify with sister that she nor her mother or other relatives have HCPOA, so this does fall to wife [name]. They are all in agreement for in-patient hospice. Interval Events: Worsening leukocytosis Length of Stay: 4 days  Current Medications: Scheduled Meds:  . antiseptic oral rinse  7 mL Mouth Rinse BID  . aspirin  81 mg Oral Daily  . calcium carbonate  3 tablet Oral TID WC  . cefTAZidime (FORTAZ)  IV  2 g Intravenous Q M,W,F-1800  . clopidogrel  75 mg Oral Daily  . dorzolamide-timolol  1 drop Both  Eyes TID  . feeding supplement (PRO-STAT SUGAR FREE 64)  30 mL Oral BID  . ipratropium-albuterol  3 mL Nebulization BID  . latanoprost  1 drop Both Eyes QHS  . sulindac  200 mg Oral BID WC    Continuous Infusions:    PRN Meds: sodium chloride, sodium chloride, colchicine, ondansetron (ZOFRAN) IV  Physical Exam: Physical Exam  Constitutional: He appears well-developed.  Eyes:  sclera jaundiced  Cardiovascular: Normal rate and regular rhythm.   Pulmonary/Chest: Effort normal.  Abdominal: He exhibits distension. There is tenderness.  Neurological: He is alert.  Alert to self only  Skin: Skin is warm and dry.  Psychiatric:  Tearful, confused                Vital Signs: BP 173/88 mmHg  Pulse 85  Temp(Src) 98.2 F (36.8 C) (Oral)  Resp 18  Ht  (1.753 m)  Wt 75 kg (165 lb 5.5 oz)  BMI 24.41 kg/m2  SpO2 98% SpO2: SpO2: 98 % O2 Device: O2 Device: Not Delivered O2 Flow Rate: O2 Flow Rate (L/min): 2 L/min  Intake/output summary:  Intake/Output Summary (Last 24 hours) at 03/31/15 1146 Last data filed at 03/31/15 1610  Gross per 24 hour  Intake    680 ml  Output      0 ml  Net    680 ml   LBM: Last BM Date: 03/30/15 Baseline Weight: Weight: 102.967 kg (227 lb) Most recent weight: Weight: 75 kg (165 lb 5.5 oz)       Palliative Assessment/Data: Flowsheet Rows        Most Recent Value   Intake Tab    Referral Department  Hospitalist   Unit at Time of Referral  Cardiac/Telemetry Unit   Palliative Care Primary Diagnosis  Nephrology   Date Notified  03/28/15   Palliative Care Type  New Palliative care   Reason for referral  Clarify Goals of Care   Date of Admission  03/27/15   Date first seen by Palliative Care  03/30/15   # of days Palliative referral response time  2 Day(s)   # of days IP prior to Palliative referral  1   Clinical Assessment    Palliative Performance Scale Score  30%   Pain Max last 24 hours  Other (Comment)   Pain Min Last 24 hours  Other  (Comment)   Dyspnea Max Last 24 Hours  Other (Comment)   Dyspnea Min Last 24 hours  Other (Comment)   Nausea Max Last 24 Hours  Other (Comment)   Nausea Min Last 24 Hours  Other (Comment)   Anxiety Max Last 24 Hours  Other (Comment)   Anxiety Min Last 24 Hours  Other (Comment)   Other Max Last 24 Hours  Other (Comment)   Psychosocial & Spiritual Assessment    Palliative Care Outcomes    Patient/Family meeting held?  No   Patient/Family wishes: Interventions discontinued/not started   Mechanical Ventilation, Trach, BiPAP, Hemodialysis, PEG, Transfusion, Vasopressors   Palliative Care follow-up planned  Yes, Facility      Additional Data Reviewed: CBC    Component Value Date/Time   WBC 27.1* 03/31/2015 0551   RBC 4.76 03/31/2015 0551   HGB 13.2 03/31/2015 0551   HCT 39.4 03/31/2015 0551   PLT 286 03/31/2015 0551   MCV 82.8 03/31/2015 0551   MCH 27.7 03/31/2015 0551   MCHC 33.5 03/31/2015 0551   RDW 18.6* 03/31/2015 0551   LYMPHSABS 0.4* 03/27/2015 1356   MONOABS 1.0 03/27/2015 1356   EOSABS 0.0 03/27/2015 1356   BASOSABS 0.0 03/27/2015 1356    CMP     Component Value Date/Time   NA 135 03/31/2015 0551   K 3.5 03/31/2015 0551   CL 93* 03/31/2015 0551   CO2 30 03/31/2015 0551   GLUCOSE 97 03/31/2015 0551   BUN 40* 03/31/2015 0551   CREATININE 4.41* 03/31/2015 0551   CREATININE 5.62* 09/26/2014 1508   CALCIUM 8.9 03/31/2015 0551   PROT 8.1 03/30/2015  0347   ALBUMIN 2.4* 03/30/2015 0347   AST 52* 03/30/2015 0347   ALT 17 03/30/2015 0347   ALKPHOS 174* 03/30/2015 0347   BILITOT 2.2* 03/30/2015 0347   GFRNONAA 13* 03/31/2015 0551   GFRNONAA 10* 09/26/2014 1508   GFRAA 16* 03/31/2015 0551   GFRAA 12* 09/26/2014 1508       Problem List:  Patient Active Problem List   Diagnosis Date Noted  . Pressure ulcer 03/30/2015  . Palliative care encounter   . Acute respiratory failure with hypoxia (HCC) 03/27/2015  . Mucocele of maxillary sinus   . Maxillary sinus mass    . HCAP (healthcare-associated pneumonia) 02/18/2015  . Acute encephalopathy 02/18/2015  . Abnormal head CT 02/18/2015  . Chronic diastolic CHF (congestive heart failure) (HCC)   . Tobacco use   . Diabetes (HCC)   . 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 Complications due to renal dialysis device, implant, and graft (HCC)   . Bleeding from dialysis shunt (HCC)   . ESRD on dialysis (HCC) 10/07/2014  . Chronic hepatitis C without hepatic coma (HCC) 09/26/2014  . Chronic HF (heart failure) (HCC) 08/19/2014  . Back pain   . Abnormal EKG   . Abnormal liver function test   . Lumbago   . Diabetic foot ulcer (HCC)   . Myositis   . NSTEMI (non-ST elevated myocardial infarction) (HCC)   . Thigh pain   . Infection and inflammatory reaction due to cardiac device, implant, and graft (HCC)   . Elevated troponin 08/10/2014  . Abnormal transaminases 08/10/2014  . Sepsis associated hypotension (HCC) 08/10/2014  . Chest pain 08/10/2014  . Weakness 08/10/2014  . CAD (coronary artery disease)   . Shock circulatory (HCC)   . Pain in the chest   . Spinal stenosis of lumbar region   . Cellulitis of left lower extremity   . Hyperkalemia   . Acute superficial venous thrombosis of lower extremity   . Radicular leg pain   . Right-sided low back pain with right-sided sciatica   . Right leg pain 06/19/2014  . Acute myocardial infarction of right ventricle (HCC) 05/22/2013  . History of placement of stent in LAD coronary artery 05/22/2013  . HTN (hypertension) 05/22/2013  . End-stage renal disease on hemodialysis (HCC) 07/23/2011  . Arthritis      Palliative Care Assessment & Plan    1.Code Status:  DNR    Code Status Orders        Start     Ordered   03/31/15 1139  Do not attempt resuscitation (DNR)   Continuous    Question Answer Comment  In the event of cardiac or respiratory ARREST Do not call a "code blue"   In the event of cardiac or respiratory ARREST Do not perform Intubation,  CPR, defibrillation or ACLS   In the event of cardiac or respiratory ARREST Use medication by any route, position, wound care, and other measures to relive pain and suffering. May use oxygen, suction and manual treatment of airway obstruction as needed for comfort.      03/31/15 1138       2. Goals of Care/Additional Recommendations:  Continue antibiotics until discharge otherwise, comfort care  Continue PO meds as long as he can swallow  Stop HD   No BiPAP  In-patient hospice. Consult placed to SW  Limitations on Scope of Treatment: Minimize Medications, No Artificial Feeding, No Blood Transfusions, No Hemodialysis, No Lab Draws and No Tracheostomy  Desire for  further Chaplaincy support:yes  Psycho-social Needs: Grief/Bereavement Support  3. Symptom Management:      1.Pain: Will start PRN dilaudid iv 0.25-0.5 q2 prn       2. Dyspnea: [place] primarily with opioids. Cont 2 via [place] as tolerated. No BIPAP, nor escalation to venti mask or NRB       3. Anxiety: Start ativan iv 0.5-1 mg q4 prn  4. Palliative Prophylaxis:   Bowel Regimen, Delirium Protocol, Frequent Pain Assessment, Palliative Wound Care and Turn Reposition  5. Prognosis: < 2 weeks  6. Discharge Planning:  Hospice facility   Care plan was discussed with Dr. Elvera Lennox, Dr. Arlean Hopping  Thank you for allowing the Palliative Medicine Team to assist in the care of this patient.   Time In: 1100 Time Out: 1150 Total Time 50 min Prolonged Time Billed  no         Irean Hong, NP  03/31/2015, 11:46 AM  Please contact Palliative Medicine Team phone at [phone] for questions and concerns.

## 2015-04-01 DIAGNOSIS — L8990 Pressure ulcer of unspecified site, unspecified stage: Secondary | ICD-10-CM

## 2015-04-01 NOTE — Consult Note (Addendum)
WOC wound consult note Reason for Consult: Consult requested for sacrum wound.  Pt was noted to have a stage 2 pressure injury to this location upon admission.  It has evolved into unstageable at this time.  Pt had Palliative care consult and progress notes indicate he desired comfort care with Hospice assistance; he states, "I just want to go home." Wound type: Unstageable across bilat buttocks and sacrum Pressure Ulcer POA: Yes Measurement: 12X12cm Wound bed: 10% red, 90% tightly adhered slough/eschar. Drainage (amount, consistency, odor) Large amt brown drainage with strong foul odor. Periwound: Intact skin surrounding Dressing procedure/placement/frequency: If aggressive plan of care is desired, then recommend surgical consult for debridement of nonviable tissue or hydrotherapy.  If goals are for comfort care, then moist gauze dressing will assist with containing odor and drainage and air mattress for use after discharge will decrease discomfort to affected area.  Discussed plan of care with patient and primary team and both agree that he desires comfort care at this time. Please re-consult if further assistance is needed.  Thank-you,  Cammie Mcgee MSN, RN, CWOCN, [place], CNS [phone]

## 2015-04-01 NOTE — Clinical Social Work Note (Addendum)
CSW received referral for Hospice Home placement at time of discharge. CSW spoke with patient's wife, [name], regarding discharge planning. Patient's wife requesting [facility].  Patient has bed available for 04/01/2015 at [facility]. MD notified for potential discharge if medically stable.  RN report: [phone] Transportation: EMS  [name], [place] [phone] Orthopedics: [phone] Surgical: [phone]

## 2015-04-01 NOTE — Progress Notes (Signed)
Lab reports blood cultures showing gram negative rods in 2nd bottle (both bottles). Benedetto Coons, NP notified. Will continue to monitor.

## 2015-04-01 NOTE — Progress Notes (Signed)
Critical lab 2nd set blood cultures gram negative rods, paged Dr. Ulla Gallo

## 2015-04-01 NOTE — Discharge Summary (Deleted)
Physician Discharge Summary  [name] [name] DOB: [birth year] DOA: 03/27/2015  PCP: Dyke Maes, MD  Admit date: 03/27/2015 Discharge date: 04/01/2015  Time spent: < 30 minutes  Recommendations for Outpatient Follow-up:  1. 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 male with a Past Medical History of Anxiety, GERD, Neuropathy, CHF, ESRD on dialysis, DM, hepatitis, HTN, HA, CAD s/p stent who presents with AMS  Hospital Course:  Goals of care - discussed with pt, Dr. Arlean Hopping from Nephrology and Eduard Roux, RN. He is not a HD candidate, transition care towards comfort, discharge to residential hospice.  Acute respiratory failure with hypoxia - likely related to HCAP, missed HD, comfort measures in place Healthcare associated pneumonia - on Imipenem here, now comfort measures in place GNR bacteremia 1/2 >> E coli - resistant to 3rd generation cephalosporins, switch to Imipenem, ?source urine vs other, patient had elevated bilirubin but RUQ US unremarkable, no abdominal pain, no  nausea/vomiting so less likely intra-abdominal source. Comfort measures Elevated troponin - troponins chronically elevated, flat trend.  Acute encephalopathy. Likely related to above. CT of the head without acute changes ESRD - not a dialysis candidate per nephrology Abnormal LFTs. History of same. - Alkaline phosphatase and total bilirubin elevated from 1 month ago - has known liver disease due to hepatitis C Chronic diastolic heart failure Chronic pain. History of back pain Diabetes type 2. Controlled Anemia, normocytic. Related to chronic illness Sinus mucocele. Left per CT.  Sacral decubitus ulcer - comfort measures  Procedures:  2D echo  Consultations:  Nephrology  Palliative  Discharge Exam: Filed Vitals:   03/31/15 0716 03/31/15 0817 03/31/15 1846 03/31/15 2055  BP:  173/88 155/79   Pulse:  85 90   Temp:  98.2 F (36.8 C) 98.5 F (36.9 C)   TempSrc:  Oral Oral   Resp:  18 16   Height:      Weight:      SpO2: 95% 98% 98% 97%    General: NAD Cardiovascular: RRR Respiratory: CTA biL  Discharge Instructions Activity:  As tolerated   Get Medicines reviewed and adjusted: Please take all your medications with you for your next visit with your Primary MD  Please request your Primary MD to go over all hospital tests and procedure/radiological results at the follow up, please ask your Primary MD to get all Hospital records sent to his/her office.  If you experience worsening of your admission symptoms, develop shortness of breath, life threatening emergency, suicidal or homicidal thoughts you must seek medical attention immediately  by calling 911 or calling your MD immediately if symptoms less severe.  You must read complete instructions/literature along with all the possible adverse reactions/side effects for all the Medicines you take and that have been prescribed to you. Take any new Medicines after you have completely understood and accpet all the possible  adverse reactions/side effects.   Do not drive when taking Pain medications.   Do not take more than prescribed Pain, Sleep and Anxiety Medications  Special Instructions: If you have smoked or chewed Tobacco in the last 2 yrs please stop smoking, stop any regular Alcohol and or any Recreational drug use.  Wear Seat belts while driving.  Please note  You were cared for by a hospitalist during your hospital stay. Once you are discharged, your primary care physician will handle any further medical issues. Please note that NO REFILLS for any discharge medications will be authorized once you are discharged, as it is imperative that you return to your primary care physician (or establish a relationship with a primary care physician if you do not have one) for your aftercare needs so that they can reassess your need for medications and monitor your lab values.    Medication List    STOP taking these medications        aspirin EC 81 MG tablet     atorvastatin 80 MG tablet  Commonly known as:  LIPITOR     B-complex with vitamin C tablet     clopidogrel 75 MG tablet  Commonly known as:  PLAVIX      TAKE these medications        acetaminophen 325 MG tablet  Commonly known as:  TYLENOL  Take 650 mg by mouth every 6 (six) hours as needed for mild pain (low back).     amitriptyline 25 MG tablet  Commonly known as:  ELAVIL  Take 25 mg by mouth at bedtime.     calcium carbonate 500 MG chewable tablet  Commonly known as:  TUMS - dosed in mg elemental calcium  Chew 3 tablets by mouth 3 (three) times daily.     colchicine 0.6 MG tablet  Take 0.6 mg by mouth every 12 (twelve) hours as needed (gout).     dorzolamide-timolol 22.3-6.8 MG/ML ophthalmic solution  Commonly known as:  COSOPT  Place 1 drop into both eyes 3 (three) times daily.     escitalopram 10 MG tablet  Commonly known as:  LEXAPRO  Take 10 mg by mouth daily.     feeding supplement (PRO-STAT SUGAR FREE 64) Liqd  Take  30 mLs by mouth 2 (two) times daily.     fentaNYL 50 MCG/HR  Commonly known as:  DURAGESIC - dosed mcg/hr  Apply one patch topically every 72 hours for pain. Remove old patch     FLUVIRIN Susp  Generic drug:  Influenza Vac Typ A&B Surf Ant  Inject 1 Syringe into the muscle once.     gabapentin 100 MG capsule  Commonly known as:  NEURONTIN  Take 100-200 mg by mouth 2 (two) times daily. Take 2 capsules (200mg ) every morning, and 1 capsule (100mg ) by mouth at bedtime     latanoprost 0.005 % ophthalmic solution  Commonly known as:  XALATAN  Place 1 drop into both eyes at bedtime.     Menthol (Topical Analgesic) 4 % Gel  Apply 1 application topically every 6 (six) hours as needed (pain). Apply to shoulders/lower back     multivitamin Tabs tablet  Take  1 tablet by mouth at bedtime.     nitroGLYCERIN 0.4 MG SL tablet  Commonly known as:  NITROSTAT  Place 1 tablet (0.4 mg total) under the tongue every 5 (five) minutes as needed for chest pain.     NUTRITIONAL SUPPLEMENT PO  Take 1 each by mouth 2 (two) times daily. Place in cup with lunch and dinner     oxyCODONE 5 MG immediate release tablet  Commonly known as:  Oxy IR/ROXICODONE  Take 5 mg by mouth daily as needed for severe pain.     sulindac 200 MG tablet  Commonly known as:  CLINORIL  Take 200 mg by mouth 2 (two) times daily.     traMADol 50 MG tablet  Commonly known as:  ULTRAM  Take 50 mg by mouth every 12 (twelve) hours as needed for severe pain.     UNABLE TO FIND  Take 60 mLs by mouth 3 (three) times daily. Med Name: MED PASS         The results of significant diagnostics from this hospitalization (including imaging, microbiology, ancillary and laboratory) are listed below for reference.    Significant Diagnostic Studies: Ct Head Wo Contrast  03/27/2015  CLINICAL DATA:  Altered mental status. EXAM: CT HEAD WITHOUT CONTRAST TECHNIQUE: Contiguous axial images were obtained from the base of the skull through the  vertex without intravenous contrast. COMPARISON:  CT scan of February 18, 2015. FINDINGS: Stable opacification of left maxillary sinus consistent with sinusitis or mucocele. Bony calvarium is intact. Mild diffuse cortical atrophy is noted. Mild chronic ischemic white matter disease is noted. No mass effect or midline shift is noted. Ventricular size is within normal limits. There is no evidence of mass lesion, hemorrhage or acute infarction. IMPRESSION: Stable opacification of left maxillary sinus consistent with sinusitis or mucocele. Mild diffuse cortical atrophy. Mild chronic ischemic white matter disease. No acute intracranial abnormality seen. Electronically Signed   By: Lupita Raider, M.D.   On: 03/27/2015 16:02   Dg Chest Portable 1 View  03/27/2015  CLINICAL DATA:  Hypotension with lethargy.  Chronic renal failure EXAM: PORTABLE CHEST 1 VIEW COMPARISON:  February 19, 2015 FINDINGS: There is patchy infiltrate in the right base. The lungs elsewhere are clear. Heart is mildly enlarged with pulmonary vascularity within normal limits. No adenopathy. 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 IMPRESSION: Right base infiltrate. Lungs elsewhere clear. Stable cardiac prominence. 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 Electronically Signed   By: Bretta Bang III M.D.   On: 03/27/2015 14:52   US Abdomen Limited Ruq  03/28/2015  CLINICAL DATA:  Elevated liver enzymes. EXAM: US ABDOMEN LIMITED - RIGHT UPPER QUADRANT COMPARISON:  CT of the abdomen and pelvis 08/12/2014 FINDINGS: Gallbladder: Multiple gallstones are present, largest measuring approximately 7 mm. Gallbladder wall is 1.8 mm in thickness. No sonographic Murphy sign or pericholecystic fluid. Common bile duct: Diameter: 3.6 mm Liver: Normal echotexture.  No focal mass. Additional: Right pleural effusion is noted.  IMPRESSION: 1. Multiple gallstones without other evidence for acute cholecystitis. 2. Right pleural effusion. Electronically Signed   By: Norva Pavlov M.D.   On: 03/28/2015 19:31    Microbiology: Recent Results (from the past 240 hour(s))  Culture, blood (routine x 2)     Status: None (Preliminary result)   Collection Time: 03/27/15  3:50 PM  Result Value Ref Range Status  Specimen Description BLOOD RIGHT FOREARM  Final   Special Requests BOTTLES DRAWN AEROBIC AND ANAEROBIC 5CC  Final   Culture  Setup Time   Final    GRAM NEGATIVE RODS AEROBIC BOTTLE ONLY CRITICAL RESULT CALLED TO, READ BACK BY AND VERIFIED WITH: T CHANEY,RN AT 7829 04/01/15 BY L BENFIELD    Culture GRAM NEGATIVE RODS  Final   Report Status PENDING  Incomplete  Culture, blood (routine x 2)     Status: None   Collection Time: 03/27/15  3:50 PM  Result Value Ref Range Status   Specimen Description BLOOD RIGHT HAND  Final   Special Requests BOTTLES DRAWN AEROBIC AND ANAEROBIC 5CC  Final   Culture  Setup Time   Final    GRAM NEGATIVE RODS IN BOTH AEROBIC AND ANAEROBIC BOTTLES CRITICAL RESULT CALLED TO, READ BACK BY AND VERIFIED WITH: S WOODARD @0552  03/29/15 MKELLY    Culture ESCHERICHIA COLI  Final   Report Status 03/31/2015 FINAL  Final   Organism ID, Bacteria ESCHERICHIA COLI  Final      Susceptibility   Escherichia coli - MIC*    AMPICILLIN >=32 RESISTANT Resistant     CEFAZOLIN >=64 RESISTANT Resistant     CEFEPIME <=1 SENSITIVE Sensitive     CEFTAZIDIME >=64 RESISTANT Resistant     CEFTRIAXONE 16 INTERMEDIATE Intermediate     CIPROFLOXACIN >=4 RESISTANT Resistant     GENTAMICIN >=16 RESISTANT Resistant     IMIPENEM 0.5 SENSITIVE Sensitive     TRIMETH/SULFA <=20 SENSITIVE Sensitive     AMPICILLIN/SULBACTAM >=32 RESISTANT Resistant     PIP/TAZO 64 INTERMEDIATE Intermediate     * ESCHERICHIA COLI  MRSA PCR Screening     Status: None   Collection Time: 03/27/15 11:25 PM  Result Value Ref Range Status     MRSA by PCR NEGATIVE NEGATIVE Final    Comment:        The GeneXpert MRSA Assay (FDA approved for NASAL specimens only), is one component of a comprehensive MRSA colonization surveillance program. It is not intended to diagnose MRSA infection nor to guide or monitor treatment for MRSA infections.   Culture, blood (routine x 2) Call MD if unable to obtain prior to antibiotics being given     Status: None (Preliminary result)   Collection Time: 03/28/15 12:20 AM  Result Value Ref Range Status   Specimen Description BLOOD LEFT ARM  Final   Special Requests IN PEDIATRIC BOTTLE 3CC  Final   Culture NO GROWTH 4 DAYS  Final   Report Status PENDING  Incomplete  Culture, blood (routine x 2) Call MD if unable to obtain prior to antibiotics being given     Status: None (Preliminary result)   Collection Time: 03/28/15 12:33 AM  Result Value Ref Range Status   Specimen Description BLOOD RIGHT HAND  Final   Special Requests IN PEDIATRIC BOTTLE [facility]  Final   Culture NO GROWTH 4 DAYS  Final   Report Status PENDING  Incomplete     Labs: Basic Metabolic Panel:  Recent Labs Lab 03/27/15 1356 03/28/15 2132 03/29/15 1045 03/30/15 0347 03/31/15 0551  NA 137 137 137 134* 135  K 4.5 3.6 3.2* 3.6 3.5  CL 95* 97* 97* 96* 93*  CO2 32 29 30 26 30   GLUCOSE 104* 91 102* 98 97  BUN 29* 32* 46* 24* 40*  CREATININE 4.86* 3.77* 4.55* 3.13* 4.41*  CALCIUM 8.7* 8.5* 8.5* 8.3* 8.9  MG  --   --  2.0  --   --  PHOS  --  1.9*  --   --   --    Liver Function Tests:  Recent Labs Lab 03/27/15 1356 03/28/15 2132 03/29/15 1045 03/30/15 0347  AST 93*  --  54* 52*  ALT 26  --  17 17  ALKPHOS 260*  --  185* 174*  BILITOT 7.8*  --  2.3* 2.2*  PROT 7.4  --  7.6 8.1  ALBUMIN 2.3* 2.4* 2.3* 2.4*   No results for input(s): LIPASE, AMYLASE in the last 168 hours.  Recent Labs Lab 03/28/15 0033  AMMONIA 34   CBC:  Recent Labs Lab 03/27/15 1356 03/28/15 2132 03/29/15 1045 03/30/15 0347  03/31/15 0551  WBC 15.0* 11.2* 13.6* 16.0* 27.1*  NEUTROABS 13.7*  --   --   --   --   HGB 10.3* 11.8* 11.0* 11.8* 13.2  HCT 31.5* 36.0* 33.0* 35.6* 39.4  MCV 84.9 82.6 82.9 83.2 82.8  PLT 212 230 266 280 286   Cardiac Enzymes:  Recent Labs Lab 03/28/15 0033 03/28/15 0238  TROPONINI 0.61* 0.62*    Signed:  Arsenia Goracke  [facility] 04/01/2015, 1:25 PM

## 2015-04-01 NOTE — Progress Notes (Signed)
[facility] here to pick up pt to transport to hospice of [facility].  Pts family is no longer agreeable to hospice, wants to seek other treatment.  Paged Dr. Elvera Lennox, he stated PTAR can leave and he would be up to speak with family.

## 2015-04-01 NOTE — Progress Notes (Signed)
PROGRESS NOTE  [name] AVW:[id number] DOB: August 19, 1955 DOA: 03/27/2015 PCP: Dyke Maes, MD  HPI: 59 yo M with diastolic CHF, ESRD, DM, HTN, CAD, admitted with AMS, hypotension shortly after HD  Subjective / 24 H Interval events - patient initially set for transfer to residential hospice however multiple family members present late in the day refusing patient to be moved.  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   HCAP (healthcare-associated pneumonia)   Chronic diastolic CHF (congestive heart failure) (HCC)   Diabetes (HCC)   Acute encephalopathy   Palliative care encounter   Pressure ulcer   End stage renal disease on dialysis (HCC)   Goals of care, counseling/discussion   Goals of care - long discussions today with more family members, his 2 nephews who feel like his transfer is premature and mistrust the wife and they don't feel like pt's wife acts in the patient's best interest - discussed at length with them this afternoon, they understand that his medical condition alone is critical and his prognosis is quite poor even with HD - will have an additional goals of care meeting tomorrow @ 1pm, I discussed this also with Onalee Hua with nephrology and Dr. Linna Darner with Palliative  Acute respiratory failure with hypoxia - likely related to HCAP, missed HD - oxygen support as needed, DNI - improving, now on room air  Healthcare associated pneumonia - on Imipenem, continue Abx  GNR bacteremia 1/2 >> E coli - resistant to 3rd generation cephalosporins, switch to Imipenem - ?source urine vs other, patient had elevated bilirubin but RUQ  US unremarkable, no abdominal pain, no nausea/vomiting so less likely intra-abdominal source.   Elevated troponin - troponins chronically elevated, flat trend.  - Denies chest pain. 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 Likely related to above. CT of the head without acute changes - Antibiotics, dialysis - improving slowly - unknown baseline  ESRD  - Only 20 minutes of dialysis 11/30 - nephrology following - per Dr. Arlean Hopping, poor dialysis candidate, stop HD - goals of care to be re-addressed tomorrow - I did let nephrology know today re: change of course and discharge cancellation  Sacral pressure ulcer - wound care consulted, recommending surgery consult  - goals of care ongoing, hold off surgical consult for now  Abnormal LFTs. History of same. - Alkaline phosphatase and total bilirubin elevated from 1 month ago - has known liver disease due to hepatitis C, reported cirrhosis however I don't see imaging characteristic of cirrhosis. [place] with multiple gallstones, no acute cholecystitis, CBD @ 3.6 mm - bilirubin improving  Chronic diastolic heart failure - Recent echo with an EF of 55%, repeat echo since some ectopy on monitor - Monitor intake and output  Chronic pain. History of back pain - Home medications include gabapentin and fentanyl patch.  - hold fentanyl patch due to AMS - appreciate palliative consult also for symptom control  Diabetes type 2. Controlled - Diet controlled - Recent A1c 5.6  Anemia, normocytic. Related to chronic illness - no Signs symptoms of active bleeding  Sinus mucocele. Left  per CT.  - stable - ENT consult obtained at last hospitalization. They opined likely related to chronic sinusitis recommend outpatient workup   Diet: Diet regular Room service appropriate?: Yes; Fluid consistency:: Thin Fluids: none  DVT Prophylaxis: SCD  Code Status:  DNR Family Communication: nephews bedside Disposition Plan: inpatient  Barriers to discharge: GOC discussions  Consultants:  Nephrology   Palliative  Procedures:  None    Antibiotics Vancomycin 11/30 >> Elita Quick 11/30 >>   Studies  No results found. Objective  Filed Vitals:   03/31/15 0716 03/31/15 0817 03/31/15 1846 03/31/15 2055  BP:  173/88 155/79   Pulse:  85 90   Temp:  98.2 F (36.8 C) 98.5 F (36.9 C)   TempSrc:  Oral Oral   Resp:  18 16   Height:      Weight:      SpO2: 95% 98% 98% 97%    Intake/Output Summary (Last 24 hours) at 04/01/15 1604 Last data filed at 03/31/15 1848  Gross per 24 hour  Intake    600 ml  Output      0 ml  Net    600 ml   Filed Weights   03/29/15 1041 03/29/15 1453 03/30/15 2222  Weight: 94.8 kg (208 lb 15.9 oz) 92.2 kg (203 lb 4.2 oz) 75 kg (165 lb 5.5 oz)   Exam:  GENERAL: NAD, confused  HEENT: + scleral icterus, PERRL  NECK: supple, no LAD  LUNGS: CTA biL, no wheezing  HEART: RRR without MRG  ABDOMEN: soft, non tender  EXTREMITIES: no clubbing / cyanosis  NEUROLOGIC: non focal   Data Reviewed: Basic Metabolic Panel:  Recent Labs Lab 03/27/15 1356 03/28/15 2132 03/29/15 1045 03/30/15 0347 03/31/15 0551  NA 137 137 137 134* 135  K 4.5 3.6 3.2* 3.6 3.5  CL 95* 97* 97* 96* 93*  CO2 32 GLUCOSE 104* 91 102* 98 97  BUN 29* 32* 46* 24* 40*  CREATININE 4.86* 3.77* 4.55* 3.13* 4.41*  CALCIUM 8.7* 8.5* 8.5* 8.3* 8.9  MG  --   --  2.0  --   --   PHOS  --  1.9*  --   --   --    Liver Function Tests:  Recent Labs Lab 03/27/15 1356 03/28/15 2132 03/29/15 1045 03/30/15 0347  AST 93*  --  54* 52*  ALT 26  --  17 17  ALKPHOS 260*  --  185* 174*  BILITOT 7.8*  --  2.3* 2.2*  PROT 7.4  --  7.6 8.1  ALBUMIN 2.3* 2.4* 2.3* 2.4*    Recent Labs Lab 03/28/15 0033  AMMONIA 34   CBC:  Recent Labs Lab 03/27/15 1356 03/28/15 2132 03/29/15 1045 03/30/15 0347 03/31/15 0551  WBC 15.0*  11.2* 13.6* 16.0* 27.1*  NEUTROABS 13.7*  --   --   --   --   HGB 10.3* 11.8* 11.0* 11.8* 13.2  HCT 31.5* 36.0* 33.0* 35.6* 39.4  MCV 84.9 82.6 82.9 83.2 82.8  PLT 212 230 266 280 286   Cardiac Enzymes:  Recent Labs Lab 03/28/15 0033 03/28/15 0238  TROPONINI 0.61* 0.62*   Recent Results (from the past 240 hour(s))  Culture, blood (routine x 2)     Status: None (Preliminary result)   Collection Time: 03/27/15  3:50 PM  Result Value Ref Range Status   Specimen Description BLOOD RIGHT FOREARM  Final   Special Requests BOTTLES DRAWN AEROBIC AND ANAEROBIC 5CC  Final  Culture  Setup Time   Final    GRAM NEGATIVE RODS AEROBIC BOTTLE ONLY CRITICAL RESULT CALLED TO, READ BACK BY AND VERIFIED WITH: T CHANEY,RN AT 0454 04/01/15 BY L BENFIELD    Culture GRAM NEGATIVE RODS  Final   Report Status PENDING  Incomplete  Culture, blood (routine x 2)     Status: None   Collection Time: 03/27/15  3:50 PM  Result Value Ref Range Status   Specimen Description BLOOD RIGHT HAND  Final   Special Requests BOTTLES DRAWN AEROBIC AND ANAEROBIC 5CC  Final   Culture  Setup Time   Final    GRAM NEGATIVE RODS IN BOTH AEROBIC AND ANAEROBIC BOTTLES CRITICAL RESULT CALLED TO, READ BACK BY AND VERIFIED WITH: S WOODARD  03/29/15 MKELLY    Culture ESCHERICHIA COLI  Final   Report Status 03/31/2015 FINAL  Final   Organism ID, Bacteria ESCHERICHIA COLI  Final      Susceptibility   Escherichia coli - MIC*    AMPICILLIN >=32 RESISTANT Resistant     CEFAZOLIN >=64 RESISTANT Resistant     CEFEPIME <=1 SENSITIVE Sensitive     CEFTAZIDIME >=64 RESISTANT Resistant     CEFTRIAXONE 16 INTERMEDIATE Intermediate     CIPROFLOXACIN >=4 RESISTANT Resistant     GENTAMICIN >=16 RESISTANT Resistant     IMIPENEM 0.5 SENSITIVE Sensitive     TRIMETH/SULFA <=20 SENSITIVE Sensitive     AMPICILLIN/SULBACTAM >=32 RESISTANT Resistant     PIP/TAZO 64 INTERMEDIATE Intermediate     * ESCHERICHIA COLI  MRSA PCR Screening      Status: None   Collection Time: 03/27/15 11:25 PM  Result Value Ref Range Status   MRSA by PCR NEGATIVE NEGATIVE Final    Comment:        The GeneXpert MRSA Assay (FDA approved for NASAL specimens only), is one component of a comprehensive MRSA colonization surveillance program. It is not intended to diagnose MRSA infection nor to guide or monitor treatment for MRSA infections.   Culture, blood (routine x 2) Call MD if unable to obtain prior to antibiotics being given     Status: None (Preliminary result)   Collection Time: 03/28/15 12:20 AM  Result Value Ref Range Status   Specimen Description BLOOD LEFT ARM  Final   Special Requests IN PEDIATRIC BOTTLE 3CC  Final   Culture NO GROWTH 4 DAYS  Final   Report Status PENDING  Incomplete  Culture, blood (routine x 2) Call MD if unable to obtain prior to antibiotics being given     Status: None (Preliminary result)   Collection Time: 03/28/15 12:33 AM  Result Value Ref Range Status   Specimen Description BLOOD RIGHT HAND  Final   Special Requests IN PEDIATRIC BOTTLE 2CC  Final   Culture NO GROWTH 4 DAYS  Final   Report Status PENDING  Incomplete   Scheduled Meds: . antiseptic oral rinse  7 mL Mouth Rinse BID  . aspirin  81 mg Oral Daily  . calcium carbonate  3 tablet Oral TID WC  . clopidogrel  75 mg Oral Daily  . dorzolamide-timolol  1 drop Both Eyes TID  . feeding supplement (PRO-STAT SUGAR FREE 64)  30 mL Oral BID  . imipenem-cilastatin  250 mg Intravenous Q12H  . ipratropium-albuterol  3 mL Nebulization BID  . latanoprost  1 drop Both Eyes QHS  . sulindac  200 mg Oral BID WC   Continuous Infusions:   Time spent: 45 minutes, more than 50%  discussing with family  Pamella Pert, MD [facility] Pager [phone]. If 7 PM - 7 AM, please contact night-coverage at [email or web address], password [facility] 04/01/2015, 4:04 PM  LOS: 5 days

## 2015-04-01 NOTE — Progress Notes (Signed)
Report called to Ashely at [facility].

## 2015-04-02 DIAGNOSIS — J9601 Acute respiratory failure with hypoxia: Principal | ICD-10-CM

## 2015-04-02 LAB — CULTURE, BLOOD (ROUTINE X 2)
CULTURE: NO GROWTH
CULTURE: NO GROWTH

## 2015-04-02 MED ORDER — ASPIRIN 81 MG PO CHEW
81.0000 mg | CHEWABLE_TABLET | Freq: Every day | ORAL | Status: DC
  Administered 2015-04-02: 81 mg via ORAL
  Filled 2015-04-02: qty 1

## 2015-04-02 NOTE — Progress Notes (Signed)
Change dressing on his sacral area.Report given toTara R.N..

## 2015-04-02 NOTE — Progress Notes (Signed)
Nutrition Brief Note  Pt identified by a low braden score. Chart reviewed. Pt is full comfort care. No nutrition interventions warranted at this time. Please consult as needed.   Roslyn Smiling, MS, RD, LDN Pager # [phone] After hours/ weekend pager # [phone]

## 2015-04-02 NOTE — Clinical Social Work Note (Signed)
Patient's wife and family met with MD and Palliative care MD-Dr. Hilma Favors regarding discharge disposition. Family agreed on [facility].  Facility contacted, clincials transmitted, patient was evaluated by a hospice staff person at the hospital and patient approved for admission.  Mr. Staffa transported by ambulance to [facility].  Family at the bedside and aware of d/c and transport.   Kiearra Oyervides Givens, MSW, LCSW Licensed Clinical Social Worker [place] [phone]

## 2015-04-02 NOTE — Discharge Summary (Addendum)
Physician Discharge Summary  [name] [name] DOB: 1955/10/25 DOA: 03/27/2015  PCP: Dyke Maes, MD  Admit date: 03/27/2015 Discharge date: 04/02/2015  Time spent: > 30 minutes  Recommendations for Outpatient Follow-up:  1. May admit to hospice facility. 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 male with a Past Medical History of Anxiety, GERD, Neuropathy, CHF, ESRD on dialysis, DM, hepatitis, HTN, HA, CAD s/p stent who presents with AMS  Hospital Course:  Goals of care - discussed with pt, Dr. Arlean Hopping from Nephrology and Eduard Roux, RN. He is not a HD candidate, transition care towards comfort, discharge to residential hospice.  Acute respiratory failure with hypoxia - likely related to HCAP, missed HD, comfort measures in place Healthcare associated pneumonia - on Imipenem here, now comfort measures in place GNR bacteremia 1/2 >> E coli - resistant to 3rd generation cephalosporins, switch to Imipenem, ?source urine vs other, patient had elevated bilirubin but RUQ  US unremarkable, no abdominal pain, no nausea/vomiting so less likely intra-abdominal source. Comfort measures Elevated troponin - troponins chronically elevated, flat trend.  Acute encephalopathy. Likely related to above. CT of the head without acute changes ESRD - not a dialysis candidate per nephrology Abnormal LFTs. History of same. - Alkaline phosphatase and total bilirubin elevated from 1 month ago - has known liver disease due to hepatitis C Chronic diastolic heart failure Chronic pain. History of back pain Diabetes type 2. Controlled Anemia, normocytic. Related to chronic illness Sinus mucocele. Left per CT.  Sacral decubitus ulcer - comfort measures  Procedures:  2D echo  Consultations:  Nephrology  Palliative  Discharge Exam: Filed Vitals:   04/01/15 1645 04/01/15 2125 04/02/15 0500 04/02/15 1325  BP: 152/86 156/87 160/87 152/80  Pulse: 89 78 88 84  Temp: 98.7 F (37.1 C) 98.1 F (36.7 C) 98.1 F (36.7 C) 98.5 F (36.9 C)  TempSrc: Oral Oral Oral Oral  Resp: Height:      Weight:  74.7 kg (164 lb 10.9 oz)    SpO2: 98% 97% 98% 98%    General: NAD Cardiovascular: RRR Respiratory: CTA biL  Discharge Instructions Activity:  As tolerated   Get Medicines reviewed and adjusted: Please take all your medications with you for your next visit with your Primary MD  Please request your Primary MD to go over all hospital tests and procedure/radiological results at the follow up, please ask your Primary MD to get all Hospital records sent to his/her office.  If you experience worsening of  your admission symptoms, develop shortness of breath, life threatening emergency, suicidal or homicidal thoughts you must seek medical attention immediately by calling 911 or calling your MD immediately if symptoms less severe.  You must read complete instructions/literature along with all the possible adverse reactions/side effects for all the Medicines you take  and that have been prescribed to you. Take any new Medicines after you have completely understood and accpet all the possible adverse reactions/side effects.   Do not drive when taking Pain medications.   Do not take more than prescribed Pain, Sleep and Anxiety Medications  Special Instructions: If you have smoked or chewed Tobacco in the last 2 yrs please stop smoking, stop any regular Alcohol and or any Recreational drug use.  Wear Seat belts while driving.  Please note  You were cared for by a hospitalist during your hospital stay. Once you are discharged, your primary care physician will handle any further medical issues. Please note that NO REFILLS for any discharge medications will be authorized once you are discharged, as it is imperative that you return to your primary care physician (or establish a relationship with a primary care physician if you do not have one) for your aftercare needs so that they can reassess your need for medications and monitor your lab values.    Medication List    STOP taking these medications        aspirin EC 81 MG tablet     atorvastatin 80 MG tablet  Commonly known as:  LIPITOR     B-complex with vitamin C tablet     clopidogrel 75 MG tablet  Commonly known as:  PLAVIX      TAKE these medications        acetaminophen 325 MG tablet  Commonly known as:  TYLENOL  Take 650 mg by mouth every 6 (six) hours as needed for mild pain (low back).     amitriptyline 25 MG tablet  Commonly known as:  ELAVIL  Take 25 mg by mouth at bedtime.     calcium carbonate 500 MG chewable tablet  Commonly known as:  TUMS - dosed in mg elemental calcium  Chew 3 tablets by mouth 3 (three) times daily.     colchicine 0.6 MG tablet  Take 0.6 mg by mouth every 12 (twelve) hours as needed (gout).     dorzolamide-timolol 22.3-6.8 MG/ML ophthalmic solution  Commonly known as:  COSOPT  Place 1 drop into both eyes 3 (three) times daily.     escitalopram 10 MG  tablet  Commonly known as:  LEXAPRO  Take 10 mg by mouth daily.     feeding supplement (PRO-STAT SUGAR FREE 64) Liqd  Take 30 mLs by mouth 2 (two) times daily.     fentaNYL 50 MCG/HR  Commonly known as:  DURAGESIC - dosed mcg/hr  Apply one patch topically every 72 hours for pain. Remove old patch     FLUVIRIN Susp  Generic drug:  Influenza Vac Typ A&B Surf Ant  Inject 1 Syringe into the muscle once.     gabapentin 100 MG capsule  Commonly known as:  NEURONTIN  Take 100-200 mg by mouth 2 (two) times daily. Take 2 capsules (200mg ) every morning, and 1 capsule (100mg ) by mouth at bedtime     latanoprost 0.005 % ophthalmic solution  Commonly known as:  XALATAN  Place 1 drop into both eyes at bedtime.     Menthol (Topical Analgesic) 4 % Gel  Apply 1 application topically  every 6 (six) hours as needed (pain). Apply to shoulders/lower back     multivitamin Tabs tablet  Take 1 tablet by mouth at bedtime.     nitroGLYCERIN 0.4 MG SL tablet  Commonly known as:  NITROSTAT  Place 1 tablet (0.4 mg total) under the tongue every 5 (five) minutes as needed for chest pain.     NUTRITIONAL SUPPLEMENT PO  Take 1 each by mouth 2 (two) times daily. Place in cup with lunch and dinner     oxyCODONE 5 MG immediate release tablet  Commonly known as:  Oxy IR/ROXICODONE  Take 5 mg by mouth daily as needed for severe pain.     sulindac 200 MG tablet  Commonly known as:  CLINORIL  Take 200 mg by mouth 2 (two) times daily.     traMADol 50 MG tablet  Commonly known as:  ULTRAM  Take 50 mg by mouth every 12 (twelve) hours as needed for severe pain.     UNABLE TO FIND  Take 60 mLs by mouth 3 (three) times daily. Med Name: MED PASS         The results of significant diagnostics from this hospitalization (including imaging, microbiology, ancillary and laboratory) are listed below for reference.    Significant Diagnostic Studies: Ct Head Wo Contrast  03/27/2015  CLINICAL DATA:  Altered mental  status. EXAM: CT HEAD WITHOUT CONTRAST TECHNIQUE: Contiguous axial images were obtained from the base of the skull through the vertex without intravenous contrast. COMPARISON:  CT scan of February 18, 2015. FINDINGS: Stable opacification of left maxillary sinus consistent with sinusitis or mucocele. Bony calvarium is intact. Mild diffuse cortical atrophy is noted. Mild chronic ischemic white matter disease is noted. No mass effect or midline shift is noted. Ventricular size is within normal limits. There is no evidence of mass lesion, hemorrhage or acute infarction. IMPRESSION: Stable opacification of left maxillary sinus consistent with sinusitis or mucocele. Mild diffuse cortical atrophy. Mild chronic ischemic white matter disease. No acute intracranial abnormality seen. Electronically Signed   By: Lupita Raider, M.D.   On: 03/27/2015 16:02   Dg Chest Portable 1 View  03/27/2015  CLINICAL DATA:  Hypotension with lethargy.  Chronic renal failure EXAM: PORTABLE CHEST 1 VIEW COMPARISON:  February 19, 2015 FINDINGS: There is patchy infiltrate in the right base. The lungs elsewhere are clear. Heart is mildly enlarged with pulmonary vascularity within normal limits. No adenopathy. 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 IMPRESSION: Right base infiltrate. Lungs elsewhere clear. Stable cardiac prominence. 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 Electronically Signed   By: Bretta Bang III M.D.   On: 03/27/2015 14:52   US Abdomen Limited Ruq  03/28/2015  CLINICAL DATA:  Elevated liver enzymes. EXAM: US ABDOMEN LIMITED - RIGHT UPPER QUADRANT COMPARISON:  CT of the abdomen and pelvis 08/12/2014 FINDINGS: Gallbladder: Multiple gallstones are present, largest measuring approximately 7 mm. Gallbladder wall is 1.8 mm in thickness. No sonographic Murphy sign or pericholecystic fluid. Common  bile duct: Diameter: 3.6 mm Liver: Normal echotexture.  No focal mass. Additional: Right pleural effusion is noted. IMPRESSION: 1. Multiple gallstones without other evidence for acute cholecystitis. 2. Right pleural effusion. Electronically Signed   By: Norva Pavlov M.D.   On: 03/28/2015 19:31    Microbiology: Recent Results (from the past 240 hour(s))  Culture, blood (routine x 2)  Status: None (Preliminary result)   Collection Time: 03/27/15  3:50 PM  Result Value Ref Range Status   Specimen Description BLOOD RIGHT FOREARM  Final   Special Requests BOTTLES DRAWN AEROBIC AND ANAEROBIC 5CC  Final   Culture  Setup Time   Final    GRAM NEGATIVE RODS AEROBIC BOTTLE ONLY CRITICAL RESULT CALLED TO, READ BACK BY AND VERIFIED WITH: T CHANEY,RN AT 4540 04/01/15 BY L BENFIELD    Culture ESCHERICHIA COLI  Final   Report Status PENDING  Incomplete  Culture, blood (routine x 2)     Status: None (Preliminary result)   Collection Time: 03/27/15  3:50 PM  Result Value Ref Range Status   Specimen Description BLOOD RIGHT HAND  Final   Special Requests BOTTLES DRAWN AEROBIC AND ANAEROBIC 5CC  Final   Culture  Setup Time   Final    GRAM NEGATIVE RODS IN BOTH AEROBIC AND ANAEROBIC BOTTLES CRITICAL RESULT CALLED TO, READ BACK BY AND VERIFIED WITH: S WOODARD @0552  03/29/15 MKELLY    Culture ESCHERICHIA COLI  Final   Report Status PENDING  Incomplete   Organism ID, Bacteria ESCHERICHIA COLI  Final      Susceptibility   Escherichia coli - MIC*    AMPICILLIN >=32 RESISTANT Resistant     CEFAZOLIN >=64 RESISTANT Resistant     CEFEPIME <=1 SENSITIVE Sensitive     CEFTAZIDIME >=64 RESISTANT Resistant     CEFTRIAXONE 16 INTERMEDIATE Intermediate     CIPROFLOXACIN >=4 RESISTANT Resistant     GENTAMICIN >=16 RESISTANT Resistant     IMIPENEM 0.5 SENSITIVE Sensitive     TRIMETH/SULFA <=20 SENSITIVE Sensitive     AMPICILLIN/SULBACTAM >=32 RESISTANT Resistant     PIP/TAZO 64 INTERMEDIATE Intermediate       * ESCHERICHIA COLI  MRSA PCR Screening     Status: None   Collection Time: 03/27/15 11:25 PM  Result Value Ref Range Status   MRSA by PCR NEGATIVE NEGATIVE Final    Comment:        The GeneXpert MRSA Assay (FDA approved for NASAL specimens only), is one component of a comprehensive MRSA colonization surveillance program. It is not intended to diagnose MRSA infection nor to guide or monitor treatment for MRSA infections.   Culture, blood (routine x 2) Call MD if unable to obtain prior to antibiotics being given     Status: None   Collection Time: 03/28/15 12:20 AM  Result Value Ref Range Status   Specimen Description BLOOD LEFT ARM  Final   Special Requests IN PEDIATRIC BOTTLE 3CC  Final   Culture NO GROWTH 5 DAYS  Final   Report Status 04/02/2015 FINAL  Final  Culture, blood (routine x 2) Call MD if unable to obtain prior to antibiotics being given     Status: None   Collection Time: 03/28/15 12:33 AM  Result Value Ref Range Status   Specimen Description BLOOD RIGHT HAND  Final   Special Requests IN PEDIATRIC BOTTLE [facility]  Final   Culture NO GROWTH 5 DAYS  Final   Report Status 04/02/2015 FINAL  Final     Labs: Basic Metabolic Panel:  Recent Labs Lab 03/27/15 1356 03/28/15 2132 03/29/15 1045 03/30/15 0347 03/31/15 0551  NA 137 137 137 134* 135  K 4.5 3.6 3.2* 3.6 3.5  CL 95* 97* 97* 96* 93*  CO2 32 29 30 26 30   GLUCOSE 104* 91 102* 98 97  BUN 29* 32* 46* 24* 40*  CREATININE 4.86* 3.77* 4.55*  3.13* 4.41*  CALCIUM 8.7* 8.5* 8.5* 8.3* 8.9  MG  --   --  2.0  --   --   PHOS  --  1.9*  --   --   --    Liver Function Tests:  Recent Labs Lab 03/27/15 1356 03/28/15 2132 03/29/15 1045 03/30/15 0347  AST 93*  --  54* 52*  ALT 26  --  17 17  ALKPHOS 260*  --  185* 174*  BILITOT 7.8*  --  2.3* 2.2*  PROT 7.4  --  7.6 8.1  ALBUMIN 2.3* 2.4* 2.3* 2.4*    Recent Labs Lab 03/28/15 0033  AMMONIA 34   CBC:  Recent Labs Lab 03/27/15 1356 03/28/15 2132  03/29/15 1045 03/30/15 0347 03/31/15 0551  WBC 15.0* 11.2* 13.6* 16.0* 27.1*  NEUTROABS 13.7*  --   --   --   --   HGB 10.3* 11.8* 11.0* 11.8* 13.2  HCT 31.5* 36.0* 33.0* 35.6* 39.4  MCV 84.9 82.6 82.9 83.2 82.8  PLT 212 230 266 280 286   Cardiac Enzymes:  Recent Labs Lab 03/28/15 0033 03/28/15 0238  TROPONINI 0.61* 0.62*    Signed:  GHERGHE, COSTIN  [facility] 04/02/2015, 1:49 PM

## 2015-04-02 NOTE — Progress Notes (Signed)
Daily Progress Note   Patient Name: [name]       Date: 04/02/2015 DOB: 11/22/1955  Age: 59 y.o. MRN#: [id number] Attending Physician: Caren Griffins, MD Primary Care Physician: Windy Kalata, MD Admit Date: 03/27/2015  Reason for Consultation/Follow-up: Establishing goals of care, Ethics, Family-Clinician negotiation and Interfamily conflict  Requested Goals of Care Meeting for Interfamily Conflict:  I met with patient's wife, 3 nephews, 2 sisters and a brother to discuss goals of care. Dr. Cruzita Lederer was present and updated them on [name]'s medical condition and supported the decision to discontinue hemodialysis.Family coming together for the first time realized the severity of the situation and that [name] can no longer receive hemodialysis and that they need to focus on providing the best care possible as he approaches EOL. They hope for dignity and comfort for him.Peaceful resolution of all previous conflict achieved through therapeutic, supportive conversation and uniting them in putting [name]'s needs first. [name] does not have decision making capacity.  Plan: 1. Discontinuing Hemodialysis- GOALS ARE FULL COMFORT 2. PROGNOSIS: 3-10 days, will have escalating symptom management needs 3. They have all agreed on [facility]- initially his wife requested [place] but the majority of the family wanted him in [place] agreed this was best. 4. Symptoms: 1. Hydromorphone SL or IV- ok to use HD access if needed for comfort meds 2. Oxygen PRN 5. Medications not related to comfort are optional ie. BP meds etc.. 6. Comfort feeding no limitations 7. Will need Palliative Wound Care and premedication prior to dressing changes.   Length of Stay: 6 days  Current  Medications: Scheduled Meds:  . antiseptic oral rinse  7 mL Mouth Rinse BID  . aspirin  81 mg Oral Daily  . calcium carbonate  3 tablet Oral TID WC  . clopidogrel  75 mg Oral Daily  . dorzolamide-timolol  1 drop Both Eyes TID  . feeding supplement (PRO-STAT SUGAR FREE 64)  30 mL Oral BID  . imipenem-cilastatin  250 mg Intravenous Q12H  . ipratropium-albuterol  3 mL Nebulization BID  . latanoprost  1 drop Both Eyes QHS  . sulindac  200 mg Oral BID WC    Continuous Infusions:    PRN Meds: colchicine, HYDROmorphone (DILAUDID) injection, LORazepam, ondansetron (ZOFRAN) IV  Physical Exam: Physical Exam  Vital Signs: BP 152/80 mmHg  Pulse 84  Temp(Src) 98.5 F (36.9 C) (Oral)  Resp 18  Ht $R'5\' 9"'aB$  (1.753 m)  Wt 74.7 kg (164 lb 10.9 oz)  BMI 24.31 kg/m2  SpO2 98% SpO2: SpO2: 98 % O2 Device: O2 Device: Not Delivered O2 Flow Rate: O2 Flow Rate (L/min): 2 L/min  Intake/output summary:  Intake/Output Summary (Last 24 hours) at 04/02/15 1407 Last data filed at 04/02/15 0850  Gross per 24 hour  Intake    240 ml  Output      0 ml  Net    240 ml   LBM: Last BM Date: 03/30/15 Baseline Weight: Weight: 102.967 kg (227 lb) Most recent weight: Weight: 74.7 kg (164 lb 10.9 oz)       Palliative Assessment/Data: Flowsheet Rows        Most Recent Value   Intake Tab    Referral Department  Hospitalist   Unit at Time of Referral  Cardiac/Telemetry Unit   Palliative Care Primary Diagnosis  Nephrology   Date Notified  03/28/15   Palliative Care Type  New Palliative care   Reason for referral  Clarify Goals of Care   Date of Admission  03/27/15   Date first seen by Palliative Care  03/30/15   # of days Palliative referral response time  2 Day(s)   # of days IP prior to Palliative referral  1   Clinical Assessment    Palliative Performance Scale Score  30%   Pain Max last 24 hours  Other (Comment)   Pain Min Last 24 hours  Other (Comment)   Dyspnea Max Last 24 Hours   Other (Comment)   Dyspnea Min Last 24 hours  Other (Comment)   Nausea Max Last 24 Hours  Other (Comment)   Nausea Min Last 24 Hours  Other (Comment)   Anxiety Max Last 24 Hours  Other (Comment)   Anxiety Min Last 24 Hours  Other (Comment)   Other Max Last 24 Hours  Other (Comment)   Psychosocial & Spiritual Assessment    Palliative Care Outcomes    Patient/Family meeting held?  No   Patient/Family wishes: Interventions discontinued/not started   Mechanical Ventilation, Trach, BiPAP, Hemodialysis, PEG, Transfusion, Vasopressors   Palliative Care follow-up planned  Yes, Facility      Additional Data Reviewed: CBC    Component Value Date/Time   WBC 27.1* 03/31/2015 0551   RBC 4.76 03/31/2015 0551   HGB 13.2 03/31/2015 0551   HCT 39.4 03/31/2015 0551   PLT 286 03/31/2015 0551   MCV 82.8 03/31/2015 0551   MCH 27.7 03/31/2015 0551   MCHC 33.5 03/31/2015 0551   RDW 18.6* 03/31/2015 0551   LYMPHSABS 0.4* 03/27/2015 1356   MONOABS 1.0 03/27/2015 1356   EOSABS 0.0 03/27/2015 1356   BASOSABS 0.0 03/27/2015 1356    CMP     Component Value Date/Time   NA 135 03/31/2015 0551   K 3.5 03/31/2015 0551   CL 93* 03/31/2015 0551   CO2 30 03/31/2015 0551   GLUCOSE 97 03/31/2015 0551   BUN 40* 03/31/2015 0551   CREATININE 4.41* 03/31/2015 0551   CREATININE 5.62* 09/26/2014 1508   CALCIUM 8.9 03/31/2015 0551   PROT 8.1 03/30/2015 0347   ALBUMIN 2.4* 03/30/2015 0347   AST 52* 03/30/2015 0347   ALT 17 03/30/2015 0347   ALKPHOS 174* 03/30/2015 0347   BILITOT 2.2* 03/30/2015 0347   GFRNONAA 13* 03/31/2015 0551   GFRNONAA 10* 09/26/2014 1508  GFRAA 16* 03/31/2015 0551   GFRAA 12* 09/26/2014 1508       Problem List:  Patient Active Problem List   Diagnosis Date Noted  . End stage renal disease on dialysis ([place])   . Goals of care, counseling/discussion   . Pressure ulcer 03/30/2015  . Palliative care encounter   . Acute respiratory failure with hypoxia ([place]) 03/27/2015  .  Mucocele of maxillary sinus   . Maxillary sinus mass   . HCAP (healthcare-associated pneumonia) 02/18/2015  . Acute encephalopathy 02/18/2015  . Abnormal head CT 02/18/2015  . Chronic diastolic CHF (congestive heart failure) ([place])   . Tobacco use   . Diabetes ([place])   . 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 Complications due to renal dialysis device, implant, and graft ([place])   . Bleeding from dialysis shunt ([place])   . ESRD on dialysis ([place]) 10/07/2014  . Chronic hepatitis C without hepatic coma ([place]) 09/26/2014  . Chronic HF (heart failure) ([place]) 08/19/2014  . Back pain   . Abnormal EKG   . Abnormal liver function test   . Lumbago   . Diabetic foot ulcer ([place])   . Myositis   . NSTEMI (non-ST elevated myocardial infarction) ([place])   . Thigh pain   . Infection and inflammatory reaction due to cardiac device, implant, and graft ([place])   . Elevated troponin 08/10/2014  . Abnormal transaminases 08/10/2014  . Sepsis associated hypotension ([place]) 08/10/2014  . Chest pain 08/10/2014  . Weakness 08/10/2014  . CAD (coronary artery disease)   . Shock circulatory ([place])   . Pain in the chest   . Spinal stenosis of lumbar region   . Cellulitis of left lower extremity   . Hyperkalemia   . Acute superficial venous thrombosis of lower extremity   . Radicular leg pain   . Right-sided low back pain with right-sided sciatica   . Right leg pain 06/19/2014  . Acute myocardial infarction of right ventricle ([place]) 05/22/2013  . History of placement of stent in LAD coronary artery 05/22/2013  . HTN (hypertension) 05/22/2013  . End-stage renal disease on hemodialysis ([place]) 07/23/2011  . Arthritis      Palliative Care Assessment & Plan    1.Code Status:  DNR    Code Status Orders        Start     Ordered   03/31/15 1139  Do not attempt resuscitation (DNR)   Continuous    Question Answer Comment  In the event of cardiac or respiratory ARREST Do not call a "code blue"   In the event of  cardiac or respiratory ARREST Do not perform Intubation, CPR, defibrillation or ACLS   In the event of cardiac or respiratory ARREST Use medication by any route, position, wound care, and other measures to relive pain and suffering. May use oxygen, suction and manual treatment of airway obstruction as needed for comfort.      03/31/15 1138       2. Goals of Care/Additional Recommendations:  FULL COMFORT CARE  Limitations on Scope of Treatment: Full Comfort Care, Initiate Comfort Feeding, No Diagnostics, No IV Antibiotics and No Lab Draws  Desire for further Chaplaincy support:yes  Psycho-social Needs: Caregiving  Support/Resources, Education on Hospice and Referral to [employer]   3. Symptom Management:      1.As above. Hydromorphone IV or SL  2. Palliative wound care  4. Palliative Prophylaxis:   Aspiration, Bowel Regimen, Delirium Protocol and Frequent Pain Assessment  5. Prognosis: <  2 weeks  6. Discharge Planning:  Hospice facility   Care plan was discussed with Family, Lorriane Shire CSW, Attending, Nephrology  Thank you for allowing the Palliative Medicine Team to assist in the care of this patient.   Time In: 115 Time Out: 220 Total Time 65 minute Prolonged Time Billed YES        Acquanetta Chain, DO  04/02/2015, 2:07 PM  Please contact Palliative Medicine Team phone at [phone] for questions and concerns.

## 2015-04-02 NOTE — Care Management Important Message (Signed)
Important Message  Patient Details  Name: [name] MRN: [id number] Date of Birth: 01/26/1956   Medicare Important Message Given:  Yes    Oralia Rud [place] 04/02/2015, 3:57 PM

## 2015-04-03 LAB — CULTURE, BLOOD (ROUTINE X 2)

## 2015-04-28 DEATH — deceased

## 2016-03-05 IMAGING — MR MR FEMUR*L* W/O CM
4 of 7 series · 18 of 40 positions shown · non-contrast
Comparison: CT 08/12/2014.  Radiographs 10/12/2013.

CLINICAL DATA: End-stage renal disease. Hepatitis-C. Total hip
arthroplasty. Drainage from the thigh. Deep infection of the LEFT
thigh.

EXAM:
MR OF THE LEFT FEMUR WITHOUT CONTRAST
TECHNIQUE: Multiplanar, multisequence MR imaging was performed. No intravenous
contrast was administered.

[Series 4: T1 · coronal · 7.0mm · 0.80mm/px · 4 of 24 slices shown (1 of 3)]
[im 1/24]
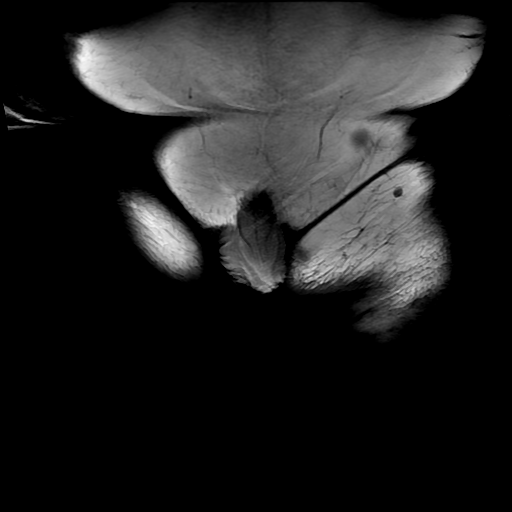
[im 8/24]
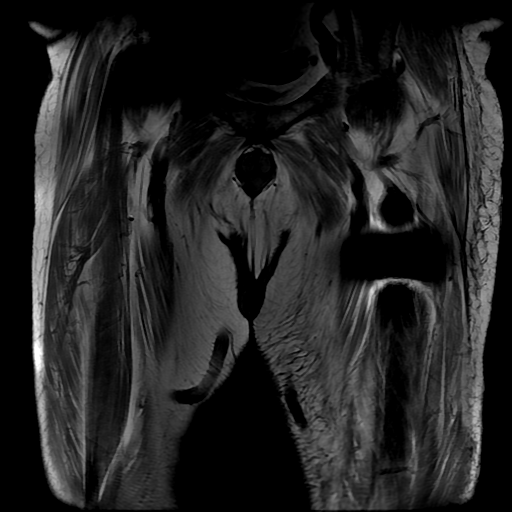
[im 16/24]
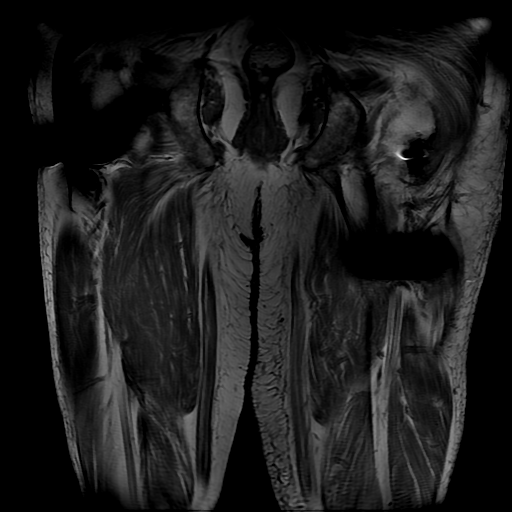
[im 24/24]
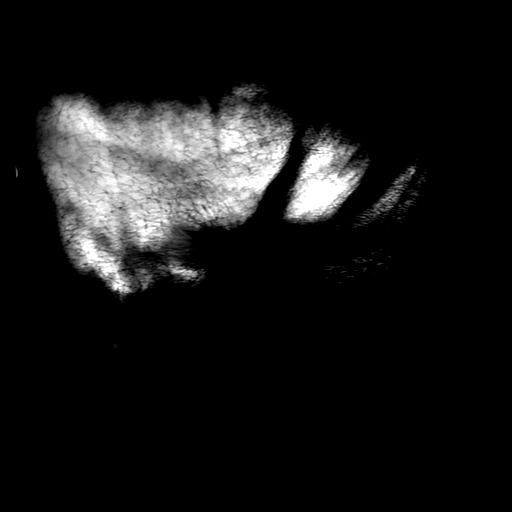

[Series 6: T1 · axial · 8.0mm · 0.80mm/px · z∈[-284,+31]mm · 4 of 37 slices shown (2 of 3)]
[im 1/37]
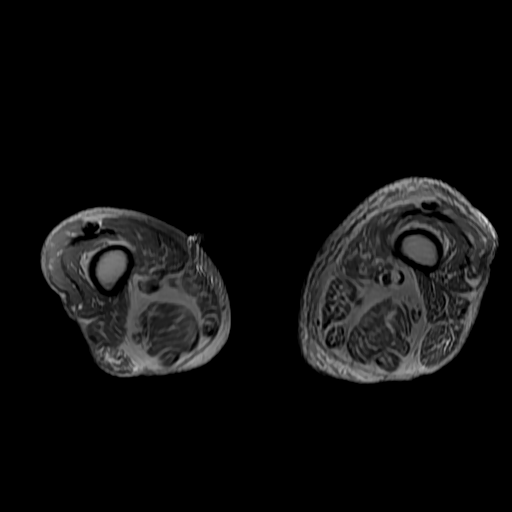
[im 7/37]
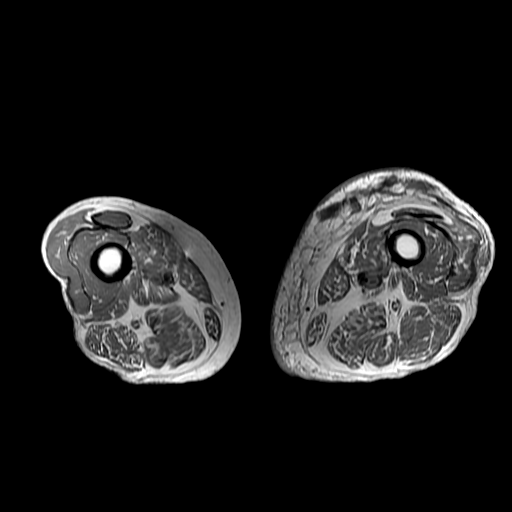
[im 19/37]
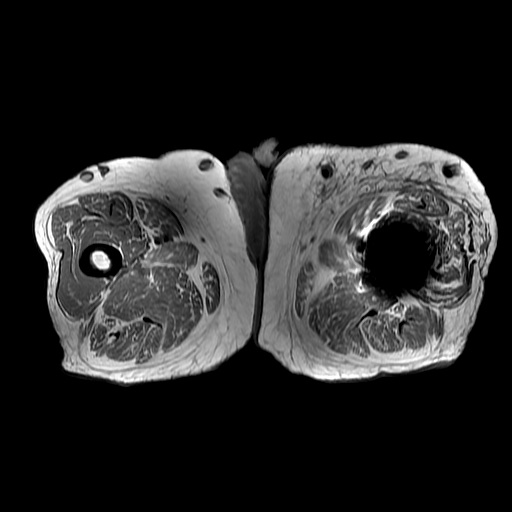
[im 31/37]
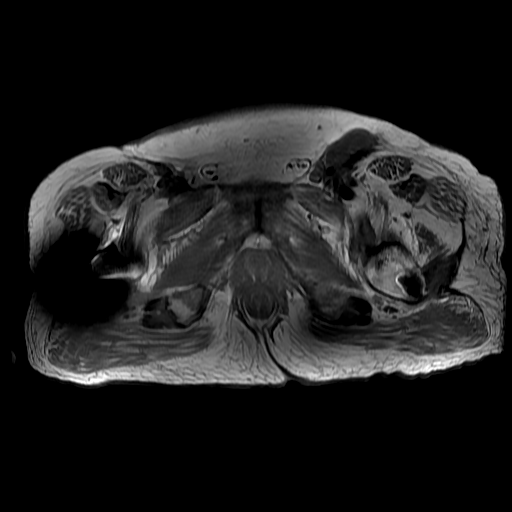

[Series 7: T2 fat-sat · axial · 8.0mm · 0.80mm/px · z∈[-284,+94]mm · 7 of 37 slices shown]
[im 1/37]
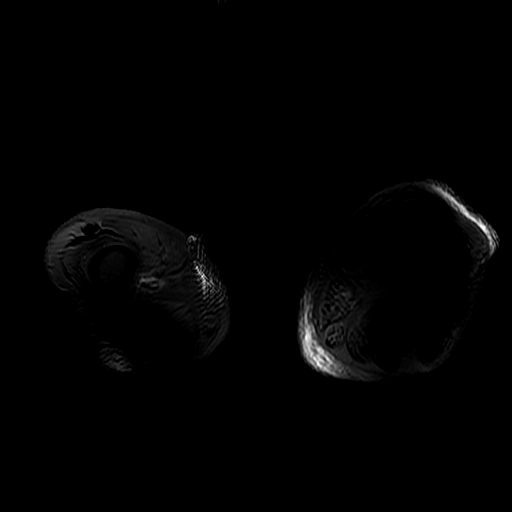
[im 7/37]
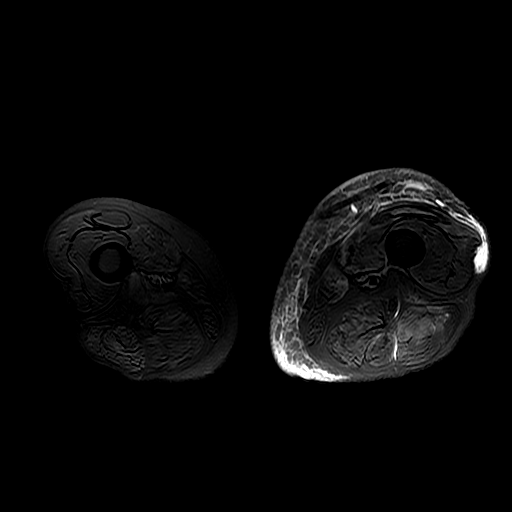
[im 13/37]
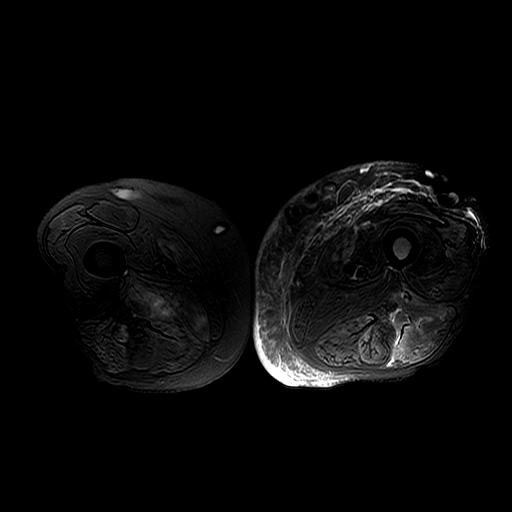
[im 19/37]
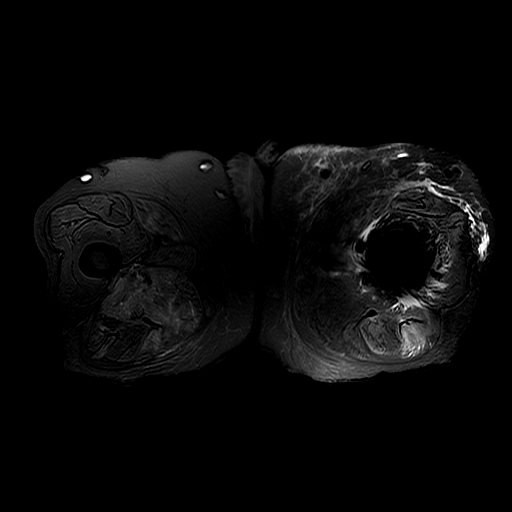
[im 25/37]
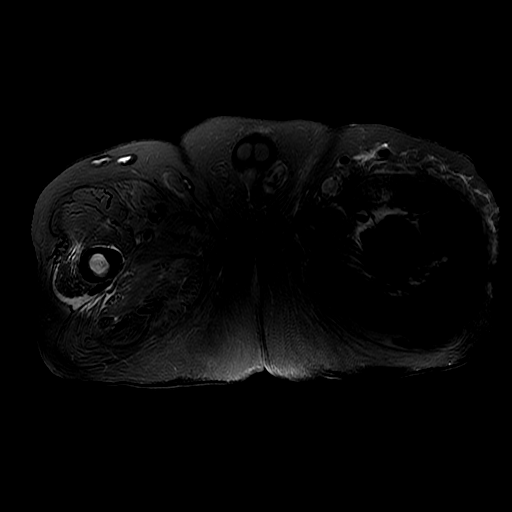
[im 31/37]
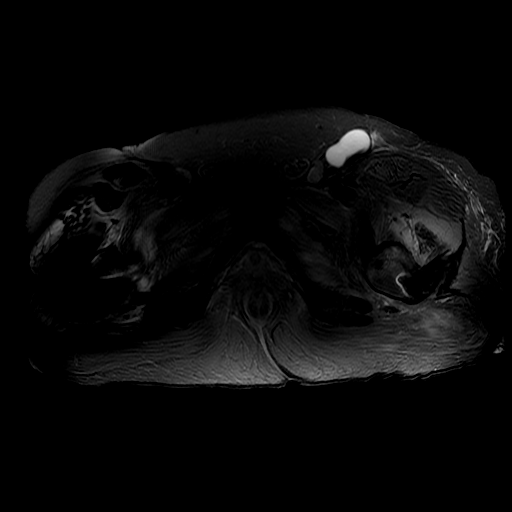
[im 37/37]
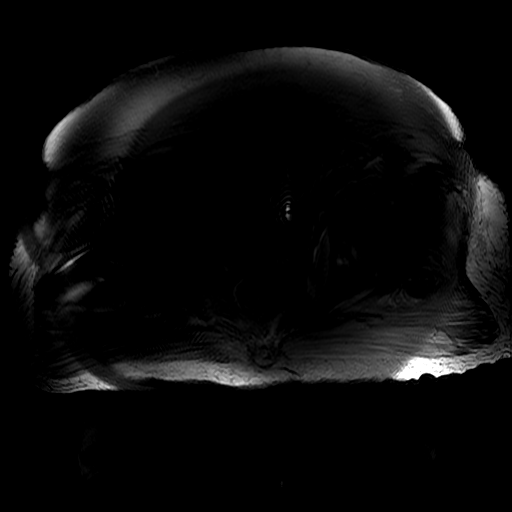

[Series 8: T1 · sagittal · 7.0mm · 0.82mm/px · 3 of 25 slices shown (3 of 3)]
[im 1/25]
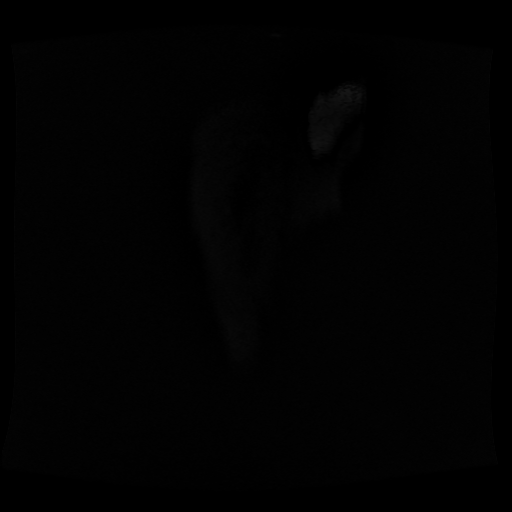
[im 13/25]
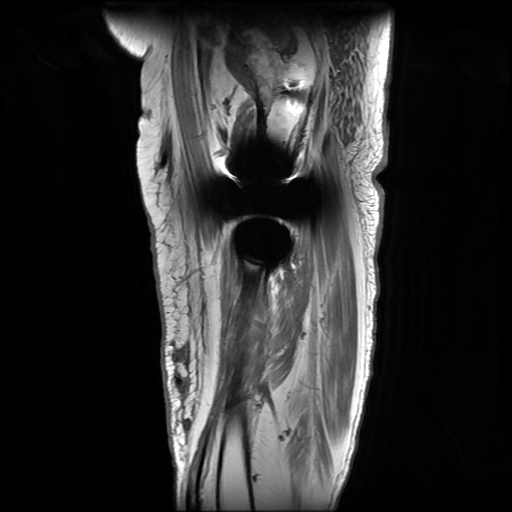
[im 25/25]
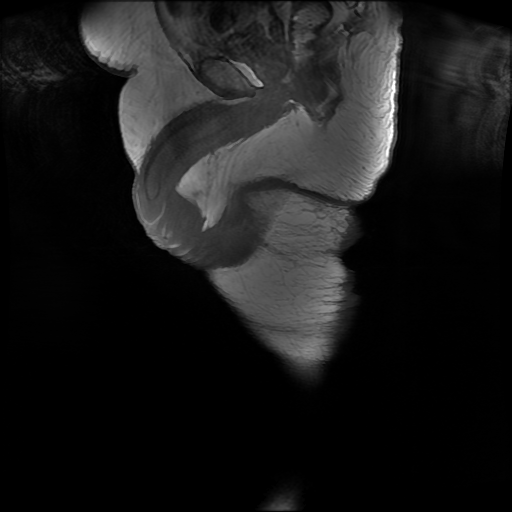

[18 of 40 positions shown; findings below may reference images not displayed]

FINDINGS: There is a large amount of susceptibility artifact in the proximal
LEFT femoral diaphysis associated with metallic foreign body
identified on prior radiographs. There is no LEFT total hip
arthroplasty. Severe LEFT hip osteoarthritis is present. Small LEFT
hip effusion and degenerative synovitis. No convincing evidence of
septic arthritis. No decubitus ulcers around the LEFT hip.

LEFT thigh AV fistula is present. Cystic lesion is present in the
LEFT inguinal region measuring 40 mm x 20 mm on axial imaging. This
probably represents a lymphocele or seroma. Ganglion cyst is
unlikely given the location. Abscess is also unlikely given the lack
of adjacent inflammatory change. Mild LEFT inguinal adenopathy is
present.

Myositis is present in the semi tendinosis and biceps femoris of the
LEFT thigh. Circumferential edema is present in the proximal LEFT
thigh which is nonspecific but commonly associated with cellulitis.
Superficial subcutaneous fluid is present along the lateral aspect
of the LEFT thigh. This superficial subcutaneous fluid collection
measures 5 cm AP, 9 mm thickest in the transverse dimension and
cm craniocaudal. Infiltrating subcutaneous edema is present more
proximally. This subcutaneous lesion may represent a small abscess
in a patient with a history of sepsis. The fluid overlies the
midportion of the vastus lateralis muscle. No deep soft tissue
infection is identified.

RIGHT thigh hamstring myositis is also present. This has a slightly
different distribution than in the contralateral LEFT side. No my
necrosis or deep soft tissue abscess in the incidentally visualized
LEFT thigh.

ORIF of the RIGHT proximal femur is present with cannulated lag
screws producing susceptibility artifact. Av fistulas present in the
RIGHT groin and thigh.
IMPRESSION: 1. Severe LEFT hip osteoarthritis. Cannulated lag screws in the
RIGHT hip.
2. Susceptibility artifact associated with metallic foreign body in
the proximal LEFT femoral diaphysis degrading the evaluation of the
deep soft tissues in this region.
3. No deep soft tissue abscess.
4. Small thin lateral LEFT thigh subcutaneous fluid collection
measuring only about 1 cm in thickness overlies the vastus
lateralis. This is nonspecific but could represent a small abscess.
5. Diffuse subcutaneous edema of the LEFT thigh, which may represent
cellulitis in the appropriate clinical setting.
6. Nonspecific myositis of the hamstrings bilaterally. In the
setting of prior sepsis, this is most likely infectious.
